# Patient Record
Sex: Female | Born: 1980 | Race: Black or African American | Hispanic: No | Marital: Single | Smoking: Never smoker
Health system: Southern US, Community
[De-identification: ages and names within clinical notes are randomized; demographics above are authoritative.]

## PROBLEM LIST (undated history)

## (undated) DIAGNOSIS — J45909 Unspecified asthma, uncomplicated: Secondary | ICD-10-CM

## (undated) DIAGNOSIS — U099 Post covid-19 condition, unspecified: Secondary | ICD-10-CM

## (undated) DIAGNOSIS — I10 Essential (primary) hypertension: Secondary | ICD-10-CM

## (undated) DIAGNOSIS — G4730 Sleep apnea, unspecified: Secondary | ICD-10-CM

## (undated) DIAGNOSIS — E669 Obesity, unspecified: Secondary | ICD-10-CM

## (undated) DIAGNOSIS — G43909 Migraine, unspecified, not intractable, without status migrainosus: Secondary | ICD-10-CM

## (undated) DIAGNOSIS — E119 Type 2 diabetes mellitus without complications: Secondary | ICD-10-CM

## (undated) HISTORY — DX: Obesity, unspecified: E66.9

## (undated) HISTORY — PX: BILATERAL CARPAL TUNNEL RELEASE: SHX6508

## (undated) HISTORY — PX: OTHER SURGICAL HISTORY: SHX169

## (undated) NOTE — *Deleted (*Deleted)
Physical Medicine and Rehabilitation Consult   Reason for Consult: Stroke with functional deficits.  Referring Physician: Dr. Ophelia Charter.    HPI: Shelly Shields is a 35 y.o. female with history of T2DM, asthma, OSA--CPAP, migraines, granulomatous iritis,  Covid 19 infection Jan '21 complicated by PE--no longer on [facility] who was admitted on 03/27/20 with acute onset of slurred speech and left sided weakness as well as reports increase in severity/frequency of HA.  UDS negative. 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   Patient out of window for tPA.  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   2D echo showed severe hypokinesis of basal to apical anteroseptum, inferoseptum and anterior LV walls with EF 20-25% and moderate LA dilatation. CT abdomen pelvis done due to reports of nausea/flank pain and revealed new 18 mm hypoattenuating lesion in right upper pole of kidney--nonspecific v/s related to pyelonephritis/evolving abscess v/s renal infarct as well as 2.2 cm defined lesion in inferior lingula suspicious for atypical/viral PNA. Dr. Jacinto Halim consulted for input on CM and recommended TEE for work up and eventual ischemia work up due to multiple risk factors. Speech therapy evaluation revealed dysarthria and work up underway. CIR recommended due to current deficits.     .   Review of Systems  Constitutional: Negative for chills and fever.  HENT: Negative for hearing loss.   Eyes: Negative for blurred vision and double vision.  Respiratory: Positive for cough (still has a lot of coughing) and shortness of breath (with activity). Negative for sputum production and stridor.   Cardiovascular: Negative for  chest pain and palpitations.  Gastrointestinal: Negative for heartburn, nausea and vomiting.  Genitourinary: Negative for dysuria, frequency and urgency.       Right flank pain over the weekend.--better now  Musculoskeletal: Negative for back pain and myalgias.       Chronic left foot pain--arch pain with standing.   Skin: Positive for rash.  Neurological: Positive for speech change and focal weakness. Negative for dizziness and headaches.  Psychiatric/Behavioral: Negative for memory loss.      Past Medical History:  Diagnosis Date  . Asthma   . COVID-19 long hauler   . Diabetes mellitus without complication (HCC)   . Hypertension   . Migraines   . Obesity   . Pulmonary embolism (HCC) 05/2019   with COVID  . Sleep apnea     Past Surgical History:  Procedure Laterality Date  . BILATERAL CARPAL TUNNEL RELEASE      Family History  Problem Relation Age of Onset  . Hypertension Mother   . Thyroid disease Mother   . Hypertension Father   . Stroke Father 48  . Diabetes Father   . Sleep apnea Father   . CVA Paternal Uncle 50    Social History:  Lives with cousins and other family lives on the same property. 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 She reports that she has never smoked. She has never used smokeless tobacco. She reports current alcohol use-- couple of beers on the weekends.  She reports that she does not use drugs.    Allergies  Allergen Reactions  . Lisinopril Cough  . Metformin  Diarrhea  . Ibuprofen Hives  . Ketorolac Hives    Medications Prior to Admission  Medication Sig Dispense Refill  . Baclofen 5 MG TABS TAKE 1 TABLET BY MOUTH THREE TIMES DAILY AS NEEDED FOR HEADACHE OR NECK PAIN    . Blood Glucose Monitoring Suppl (ACCU-CHEK NANO SMARTVIEW) W/DEVICE KIT 1 Device by Does not apply route 4 (four) times daily - after meals and at bedtime. 1 kit 0  . escitalopram (LEXAPRO) 10 MG tablet Take  10 mg by mouth daily.    . fexofenadine (ALLEGRA) 180 MG tablet Take 180 mg by mouth daily as needed.     . fluticasone (FLONASE) 50 MCG/ACT nasal spray Place 2 sprays into both nostrils daily as needed for allergies.   3  . fluticasone (FLOVENT HFA) 110 MCG/ACT inhaler Inhale 1 puff into the lungs daily.     Marland Kitchen glucose blood (ACCU-CHEK SMARTVIEW) test strip Check sugar 6 x daily (Patient taking differently: Check sugar 6 x daily. Uses true test instead) 200 each 3  . Insulin Glargine (LANTUS) 100 UNIT/ML Solostar Pen Inject 35 Units into the skin daily. (Patient taking differently: Inject 80 Units into the skin 2 (two) times daily. ) 15 mL 11  . insulin lispro protamine-lispro (HUMALOG 75/25 MIX) (75-25) 100 UNIT/ML SUSP injection Inject 50 Units into the skin 2 (two) times daily with a meal.     . Insulin Pen Needle (INSUPEN PEN NEEDLES) 32G X 4 MM MISC BD Pen Needles- brand specific. Inject insulin via insulin pen daily 200 each 3  . Lancet Devices (ACCU-CHEK SOFTCLIX) lancets Use as instructed for blood glucose checks four times daily, before meals and at bedtime 100 each 5  . medroxyPROGESTERone (DEPO-PROVERA) 150 MG/ML injection Inject 150 mg into the muscle every 3 (three) months.    . nortriptyline (PAMELOR) 10 MG capsule Take 10 mg by mouth at bedtime.    . ondansetron (ZOFRAN) 4 MG tablet Take 1 tablet (4 mg total) by mouth every 8 (eight) hours as needed for nausea or vomiting. 20 tablet 0  . pantoprazole (PROTONIX) 40 MG tablet Take 40 mg by mouth daily.    . rosuvastatin (CRESTOR) 10 MG tablet Take 10 mg by mouth 2 (two) times a week.    . SUMAtriptan (IMITREX) 100 MG tablet Take 1 tablet (100 mg total) by mouth every 2 (two) hours as needed for migraine or headache. 10 tablet 1  . topiramate (TOPAMAX) 100 MG tablet Take 200 mg by mouth at bedtime.     . TRUEPLUS LANCETS 33G MISC USE TO CHECK BLOOD GLUCOSE QID - AFTER MEALS AND AT BEDTIME  3  . valsartan (DIOVAN) 160 MG tablet Take 160  mg by mouth daily.    . VENTOLIN HFA 108 (90 BASE) MCG/ACT inhaler Use as directed 2 puffs in the mouth or throat 4 (four) times daily as needed for shortness of breath.   3  . apixaban (ELIQUIS) 5 MG TABS tablet Take 2 tablets (10 mg total) by mouth 2 (two) times daily for 5 days. 20 tablet 0    Home: Home Living Family/patient expects to be discharged to:: Private residence Living Arrangements: Other relatives  Functional History:   Functional Status:  Mobility:          ADL:    Cognition: Cognition Orientation Level: Oriented X4    Blood pressure (!) 129/93, pulse 100, temperature 98.3 F (36.8 C), temperature source Oral, resp. rate 18, SpO2 97 %. Physical Exam Vitals and  nursing note reviewed.  Constitutional:      Appearance: Normal appearance.     Comments: Obese female, walking with PT with reports of left foot pain--occasional left toe drag and mild left inattention.  NAD.   Pulmonary:     Effort: Pulmonary effort is normal.  Genitourinary:    Comments: Reddish appearing urine via Purewick--was taking pyridium PTA.  Skin:    Comments: Flat large macular lesions on bilateral shins.   Neurological:     Mental Status: She is alert and oriented to person, place, and time.     Gait: Gait abnormal.     Comments: Left facial weakness with moderate anarthria/dysarthria. Able to answer orientation questions and follow simple motor commands without difficulty.      Results for orders placed or performed during the hospital encounter of 03/27/20 (from the past 24 hour(s))  CBG monitoring, ED     Status: Abnormal   Collection Time: 03/27/20  9:23 AM  Result Value Ref Range   Glucose-Capillary 346 (H) 70 - 99 mg/dL  Ethanol     Status: None   Collection Time: 03/27/20  9:46 AM  Result Value Ref Range   Alcohol, Ethyl (B) <10 <10 mg/dL  Protime-INR     Status: None   Collection Time: 03/27/20  9:46 AM  Result Value Ref Range   Prothrombin Time 14.6 11.4 - 15.2  seconds   INR 1.2 0.8 - 1.2  APTT     Status: Abnormal   Collection Time: 03/27/20  9:46 AM  Result Value Ref Range   aPTT 22 (L) 24 - 36 seconds  CBC     Status: Abnormal   Collection Time: 03/27/20  9:46 AM  Result Value Ref Range   WBC 11.7 (H) 4.0 - 10.5 K/uL   RBC 4.54 3.87 - 5.11 MIL/uL   Hemoglobin 12.8 12.0 - 15.0 g/dL   HCT 16.1 36 - 46 %   MCV 86.6 80.0 - 100.0 fL   MCH 28.2 26.0 - 34.0 pg   MCHC 32.6 30.0 - 36.0 g/dL   RDW 09.6 04.5 - 40.9 %   Platelets 195 150 - 400 K/uL   nRBC 0.0 0.0 - 0.2 %  Differential     Status: Abnormal   Collection Time: 03/27/20  9:46 AM  Result Value Ref Range   Neutrophils Relative % 84 %   Neutro Abs 9.7 (H) 1.7 - 7.7 K/uL   Lymphocytes Relative 12 %   Lymphs Abs 1.4 0.7 - 4.0 K/uL   Monocytes Relative 4 %   Monocytes Absolute 0.5 0.1 - 1.0 K/uL   Eosinophils Relative 0 %   Eosinophils Absolute 0.0 0.0 - 0.5 K/uL   Basophils Relative 0 %   Basophils Absolute 0.0 0.0 - 0.1 K/uL   Immature Granulocytes 0 %   Abs Immature Granulocytes 0.04 0.00 - 0.07 K/uL  Comprehensive metabolic panel     Status: Abnormal   Collection Time: 03/27/20  9:46 AM  Result Value Ref Range   Sodium 133 (L) 135 - 145 mmol/L   Potassium 3.6 3.5 - 5.1 mmol/L   Chloride 104 98 - 111 mmol/L   CO2 21 (L) 22 - 32 mmol/L   Glucose, Bld 373 (H) 70 - 99 mg/dL   BUN 5 (L) 6 - 20 mg/dL   Creatinine, Ser 8.11 0.44 - 1.00 mg/dL   Calcium 8.4 (L) 8.9 - 10.3 mg/dL   Total Protein 6.2 (L) 6.5 - 8.1 g/dL   Albumin  3.1 (L) 3.5 - 5.0 g/dL   AST 13 (L) 15 - 41 U/L   ALT 18 0 - 44 U/L   Alkaline Phosphatase 82 38 - 126 U/L   Total Bilirubin 1.0 0.3 - 1.2 mg/dL   GFR, Estimated >78 >29 mL/min   Anion gap 8 5 - 15  Beta-hydroxybutyric acid     Status: Abnormal   Collection Time: 03/27/20  9:46 AM  Result Value Ref Range   Beta-Hydroxybutyric Acid 0.67 (H) 0.05 - 0.27 mmol/L  Hemoglobin A1c     Status: Abnormal   Collection Time: 03/27/20  9:46 AM  Result Value Ref  Range   Hgb A1c MFr Bld 11.2 (H) 4.8 - 5.6 %   Mean Plasma Glucose 274.74 mg/dL  I-stat chem 8, ED     Status: Abnormal   Collection Time: 03/27/20 10:02 AM  Result Value Ref Range   Sodium 137 135 - 145 mmol/L   Potassium 3.7 3.5 - 5.1 mmol/L   Chloride 103 98 - 111 mmol/L   BUN 7 6 - 20 mg/dL   Creatinine, Ser 5.62 0.44 - 1.00 mg/dL   Glucose, Bld 130 (H) 70 - 99 mg/dL   Calcium, Ion 8.65 7.84 - 1.40 mmol/L   TCO2 20 (L) 22 - 32 mmol/L   Hemoglobin 12.9 12.0 - 15.0 g/dL   HCT 69.6 36 - 46 %  I-Stat beta hCG blood, ED     Status: None   Collection Time: 03/27/20 10:02 AM  Result Value Ref Range   I-stat hCG, quantitative <5.0 <5 mIU/mL   Comment 3          Respiratory Panel by RT PCR (Flu A&B, Covid) - Nasopharyngeal Swab     Status: None   Collection Time: 03/27/20 10:04 AM   Specimen: Nasopharyngeal Swab  Result Value Ref Range   SARS Coronavirus 2 by RT PCR NEGATIVE NEGATIVE   Influenza A by PCR NEGATIVE NEGATIVE   Influenza B by PCR NEGATIVE NEGATIVE  Urine rapid drug screen (hosp performed)     Status: None   Collection Time: 03/27/20 10:24 AM  Result Value Ref Range   Opiates NONE DETECTED NONE DETECTED   Cocaine NONE DETECTED NONE DETECTED   Benzodiazepines NONE DETECTED NONE DETECTED   Amphetamines NONE DETECTED NONE DETECTED   Tetrahydrocannabinol NONE DETECTED NONE DETECTED   Barbiturates NONE DETECTED NONE DETECTED  Urinalysis, Routine w reflex microscopic Urine, Clean Catch     Status: Abnormal   Collection Time: 03/27/20 10:24 AM  Result Value Ref Range   Color, Urine ORANGE (A) YELLOW   APPearance CLEAR CLEAR   Specific Gravity, Urine  1.005 - 1.030    TEST NOT REPORTED DUE TO COLOR INTERFERENCE OF URINE PIGMENT   pH  5.0 - 8.0    TEST NOT REPORTED DUE TO COLOR INTERFERENCE OF URINE PIGMENT   Glucose, UA (A) NEGATIVE mg/dL    TEST NOT REPORTED DUE TO COLOR INTERFERENCE OF URINE PIGMENT   Hgb urine dipstick (A) NEGATIVE    TEST NOT REPORTED DUE TO COLOR  INTERFERENCE OF URINE PIGMENT   Bilirubin Urine (A) NEGATIVE    TEST NOT REPORTED DUE TO COLOR INTERFERENCE OF URINE PIGMENT   Ketones, ur (A) NEGATIVE mg/dL    TEST NOT REPORTED DUE TO COLOR INTERFERENCE OF URINE PIGMENT   Protein, ur (A) NEGATIVE mg/dL    TEST NOT REPORTED DUE TO COLOR INTERFERENCE OF URINE PIGMENT   Nitrite (A) NEGATIVE    TEST  NOT REPORTED DUE TO COLOR INTERFERENCE OF URINE PIGMENT   Leukocytes,Ua (A) NEGATIVE    TEST NOT REPORTED DUE TO COLOR INTERFERENCE OF URINE PIGMENT  Urinalysis, Microscopic (reflex)     Status: Abnormal   Collection Time: 03/27/20 10:24 AM  Result Value Ref Range   RBC / HPF 0-5 0 - 5 RBC/hpf   WBC, UA 0-5 0 - 5 WBC/hpf   Bacteria, UA RARE (A) NONE SEEN   Squamous Epithelial / LPF 6-10 0 - 5  CBG monitoring, ED     Status: Abnormal   Collection Time: 03/27/20  5:46 PM  Result Value Ref Range   Glucose-Capillary 303 (H) 70 - 99 mg/dL  Glucose, capillary     Status: Abnormal   Collection Time: 03/27/20  8:53 PM  Result Value Ref Range   Glucose-Capillary 254 (H) 70 - 99 mg/dL  Glucose, capillary     Status: Abnormal   Collection Time: 03/28/20 12:01 AM  Result Value Ref Range   Glucose-Capillary 218 (H) 70 - 99 mg/dL  Lipid panel     Status: Abnormal   Collection Time: 03/28/20  4:15 AM  Result Value Ref Range   Cholesterol 176 0 - 200 mg/dL   Triglycerides 161 <096 mg/dL   HDL 29 (L) >04 mg/dL   Total CHOL/HDL Ratio 6.1 RATIO   VLDL 21 0 - 40 mg/dL   LDL Cholesterol 540 (H) 0 - 99 mg/dL  Antithrombin III     Status: None   Collection Time: 03/28/20  4:15 AM  Result Value Ref Range   AntiThromb III Func 99 75 - 120 %  TSH     Status: None   Collection Time: 03/28/20  4:15 AM  Result Value Ref Range   TSH 3.896 0.350 - 4.500 uIU/mL  Glucose, capillary     Status: Abnormal   Collection Time: 03/28/20  4:30 AM  Result Value Ref Range   Glucose-Capillary 205 (H) 70 - 99 mg/dL  Glucose, capillary     Status: Abnormal    Collection Time: 03/28/20  8:26 AM  Result Value Ref Range   Glucose-Capillary 193 (H) 70 - 99 mg/dL   CT Code Stroke CTA Head W/WO contrast  Result Date: 03/27/2020 CLINICAL DATA:  27 year old female code stroke presentation. Slurred speech. Plain head CT suspicious for right MCA infarct ASPECTS 8. History of diabetes, hypertension. Family history of stroke. Status post COVID-19 and PE in February. EXAM: CT ANGIOGRAPHY HEAD AND NECK CT PERFUSION BRAIN TECHNIQUE: Multidetector CT imaging of the head and neck was performed using the standard protocol during bolus administration of intravenous contrast. Multiplanar CT image reconstructions and MIPs were obtained to evaluate the vascular anatomy. Carotid stenosis measurements (when applicable) are obtained utilizing NASCET criteria, using the distal internal carotid diameter as the denominator. Multiphase CT imaging of the brain was performed following IV bolus contrast injection. Subsequent parametric perfusion maps were calculated using RAPID software. CONTRAST:  OMNIPAQUE IOHEXOL 350 MG/ML SOLN COMPARISON:  plain head CT  0929 hours today. FINDINGS: CT Brain Perfusion Findings: ASPECTS: 8 CBF (<30%) Volume: 16mL Perfusion (Tmax>6.0s) volume: 22mL.  Hypoperfusion index 0.5. Mismatch Volume: 6mL Infarction Location:Right insula, operculum, largely corresponding to the plain CT findings. CTA NECK Skeleton: No acute osseous abnormality identified. Upper chest: Negative aside from mild motion artifact. Other neck: Negative. Aortic arch: 3 vessel arch configuration.  No arch atherosclerosis. Right carotid system: Negative aside from a partially retropharyngeal course. Left carotid system: Negative. Vertebral arteries: Detail of  the proximal right subclavian artery and right vertebral origin partially obscured by dense right subclavian venous contrast. The right V1 segment appears normal. The right vertebral is patent to the skull base with no plaque or  stenosis identified. Normal proximal left subclavian artery and left vertebral artery origin. Left vertebral artery is fairly codominant and within normal limits to the skull base. CTA HEAD Posterior circulation: Codominant distal vertebral arteries are within normal limits. Normal PICA origins and vertebrobasilar junction. Patent basilar artery without stenosis. AICA, SCA and PCA origins are within normal limits. Posterior communicating arteries are diminutive or absent. There is mild irregularity of the right PCA P2 segment (series 2, image 23. No significant stenosis. Otherwise bilateral PCA branches are within normal limits. Anterior circulation: Both ICA siphons are patent with no plaque or stenosis identified. Patent carotid termini. Normal MCA and ACA origins. Mildly dominant right A1. mild irregularity of the left A1 (series 10, image 22). Anterior communicating artery within normal limits. Other bilateral PCA branches are tortuous but within normal limits. Left MCA M1 segment and bifurcation are patent without stenosis. But thick MIP images on series 12 suggest mild irregularity of left MCA M2 and M3 branches. Right MCA M1 segment and right MCA trifurcation are patent without stenosis. No right MCA M2 branch occlusion or irregularity is identified. But a right M3 branch occlusion in the middle division is identified on series 12, image 14 and series 7 images 88 and 87. Other right MCA branches appear within normal limits. Venous sinuses: Early contrast timing, not evaluated. Anatomic variants: Mildly dominant right A1. Review of the MIP images confirms the above findings IMPRESSION: 1. Negative for large vessel occlusion but positive for Right MCA M3 occlusion. CT Perfusion detects core infarct with minimally larger penumbra at the right insula/operculum corresponding to the plain CT finding. Preliminary report of the above These results were communicated to Dr. Iver Nestle at 1008 hours on 03/27/2020 by text  page via the [facility] messaging system. 2. Additionally, mild vessel irregularity is noted in multiple other circle-of-Willis branches, including the left A1, right P2, left M2. Although nonspecific this constellation of clinical and imaging findings might indicate accelerated branch vessel atherosclerosis. Although other large vessels appear normal, with no atherosclerosis in the neck or at the aortic arch. Electronically Signed   By: Odessa Fleming M.D.   On: 03/27/2020 10:24   CT Code Stroke CTA Neck W/WO contrast  Result Date: 03/27/2020 CLINICAL DATA:  30 year old female code stroke presentation. Slurred speech. Plain head CT suspicious for right MCA infarct ASPECTS 8. History of diabetes, hypertension. Family history of stroke. Status post COVID-19 and PE in February. EXAM: CT ANGIOGRAPHY HEAD AND NECK CT PERFUSION BRAIN TECHNIQUE: Multidetector CT imaging of the head and neck was performed using the standard protocol during bolus administration of intravenous contrast. Multiplanar CT image reconstructions and MIPs were obtained to evaluate the vascular anatomy. Carotid stenosis measurements (when applicable) are obtained utilizing NASCET criteria, using the distal internal carotid diameter as the denominator. Multiphase CT imaging of the brain was performed following IV bolus contrast injection. Subsequent parametric perfusion maps were calculated using RAPID software. CONTRAST:  OMNIPAQUE IOHEXOL 350 MG/ML SOLN COMPARISON:  plain head CT  0929 hours today. FINDINGS: CT Brain Perfusion Findings: ASPECTS: 8 CBF (<30%) Volume: 16mL Perfusion (Tmax>6.0s) volume: 22mL.  Hypoperfusion index 0.5. Mismatch Volume: 6mL Infarction Location:Right insula, operculum, largely corresponding to the plain CT findings. CTA NECK Skeleton: No acute osseous abnormality identified. Upper chest: Negative aside  from mild motion artifact. Other neck: Negative. Aortic arch: 3 vessel arch configuration.  No arch atherosclerosis. Right  carotid system: Negative aside from a partially retropharyngeal course. Left carotid system: Negative. Vertebral arteries: Detail of the proximal right subclavian artery and right vertebral origin partially obscured by dense right subclavian venous contrast. The right V1 segment appears normal. The right vertebral is patent to the skull base with no plaque or stenosis identified. Normal proximal left subclavian artery and left vertebral artery origin. Left vertebral artery is fairly codominant and within normal limits to the skull base. CTA HEAD Posterior circulation: Codominant distal vertebral arteries are within normal limits. Normal PICA origins and vertebrobasilar junction. Patent basilar artery without stenosis. AICA, SCA and PCA origins are within normal limits. Posterior communicating arteries are diminutive or absent. There is mild irregularity of the right PCA P2 segment (series 2, image 23. No significant stenosis. Otherwise bilateral PCA branches are within normal limits. Anterior circulation: Both ICA siphons are patent with no plaque or stenosis identified. Patent carotid termini. Normal MCA and ACA origins. Mildly dominant right A1. mild irregularity of the left A1 (series 10, image 22). Anterior communicating artery within normal limits. Other bilateral PCA branches are tortuous but within normal limits. Left MCA M1 segment and bifurcation are patent without stenosis. But thick MIP images on series 12 suggest mild irregularity of left MCA M2 and M3 branches. Right MCA M1 segment and right MCA trifurcation are patent without stenosis. No right MCA M2 branch occlusion or irregularity is identified. But a right M3 branch occlusion in the middle division is identified on series 12, image 14 and series 7 images 88 and 87. Other right MCA branches appear within normal limits. Venous sinuses: Early contrast timing, not evaluated. Anatomic variants: Mildly dominant right A1. Review of the MIP images confirms  the above findings IMPRESSION: 1. Negative for large vessel occlusion but positive for Right MCA M3 occlusion. CT Perfusion detects core infarct with minimally larger penumbra at the right insula/operculum corresponding to the plain CT finding. Preliminary report of the above These results were communicated to Dr. Iver Nestle at 1008 hours on 03/27/2020 by text page via the [facility] messaging system. 2. Additionally, mild vessel irregularity is noted in multiple other circle-of-Willis branches, including the left A1, right P2, left M2. Although nonspecific this constellation of clinical and imaging findings might indicate accelerated branch vessel atherosclerosis. Although other large vessels appear normal, with no atherosclerosis in the neck or at the aortic arch. Electronically Signed   By: Odessa Fleming M.D.   On: 03/27/2020 10:24   MR ANGIO HEAD WO CONTRAST  Result Date: 03/27/2020 CLINICAL DATA:  Stroke follow-up. Slurred speech. Left-sided weakness. EXAM: MRI HEAD WITHOUT CONTRAST MRA HEAD WITHOUT CONTRAST TECHNIQUE: Multiplanar, multiecho pulse sequences of the brain and surrounding structures were obtained without intravenous contrast. Angiographic images of the head were obtained using MRA technique without contrast. COMPARISON:  Head CT, CTA, and CTP 03/27/2020 FINDINGS: MRI HEAD FINDINGS Brain: There is a moderate-sized acute right MCA territory infarct involving the posterior frontal lobe and insula with good correlation with the earlier CTP. there is a small amount of associated petechial hemorrhage without malignant hemorrhagic transformation. Additionally, there are subcentimeter infarcts involving cortex and white matter in the left parietal and left occipital lobes which are largely subacute in appearance and which also have a small amount of associated petechial hemorrhage. There is no intracranial mass effect or extra-axial fluid collection. The ventricles are normal in size. Vascular: Major  intracranial  vascular flow voids are preserved. Skull and upper cervical spine: Unremarkable bone marrow signal. Sinuses/Orbits: Unremarkable orbits. Paranasal sinuses and mastoid air cells are clear. Other: None. MRA HEAD FINDINGS The visualized distal vertebral arteries are widely patent to the basilar. Patent PICA, AICA, and SCA origins are seen bilaterally. The basilar artery is widely patent. Posterior communicating arteries are not clearly identified and may be diminutive or absent. Both PCAs are patent without evidence of a significant proximal stenosis. The internal carotid arteries are widely patent from skull base to carotid termini. ACAs and MCAs are patent without evidence of a significant proximal stenosis. As seen on the earlier CTA, there is a right M3 branch occlusion with some distal reconstitution. The scattered areas of mild branch vessel irregularity involving anterior and posterior circulation elsewhere on CTA are not apparent on this MRA. No aneurysm is identified. IMPRESSION: 1. Moderate-sized acute right MCA infarct. 2. Small subacute left parieto-occipital infarcts. 3. Right M3 branch occlusion, otherwise negative head MRA. Electronically Signed   By: Sebastian Ache M.D.   On: 03/27/2020 17:45   MR BRAIN WO CONTRAST  Result Date: 03/27/2020 CLINICAL DATA:  Stroke follow-up. Slurred speech. Left-sided weakness. EXAM: MRI HEAD WITHOUT CONTRAST MRA HEAD WITHOUT CONTRAST TECHNIQUE: Multiplanar, multiecho pulse sequences of the brain and surrounding structures were obtained without intravenous contrast. Angiographic images of the head were obtained using MRA technique without contrast. COMPARISON:  Head CT, CTA, and CTP 03/27/2020 FINDINGS: MRI HEAD FINDINGS Brain: There is a moderate-sized acute right MCA territory infarct involving the posterior frontal lobe and insula with good correlation with the earlier CTP. there is a small amount of associated petechial hemorrhage without malignant hemorrhagic  transformation. Additionally, there are subcentimeter infarcts involving cortex and white matter in the left parietal and left occipital lobes which are largely subacute in appearance and which also have a small amount of associated petechial hemorrhage. There is no intracranial mass effect or extra-axial fluid collection. The ventricles are normal in size. Vascular: Major intracranial vascular flow voids are preserved. Skull and upper cervical spine: Unremarkable bone marrow signal. Sinuses/Orbits: Unremarkable orbits. Paranasal sinuses and mastoid air cells are clear. Other: None. MRA HEAD FINDINGS The visualized distal vertebral arteries are widely patent to the basilar. Patent PICA, AICA, and SCA origins are seen bilaterally. The basilar artery is widely patent. Posterior communicating arteries are not clearly identified and may be diminutive or absent. Both PCAs are patent without evidence of a significant proximal stenosis. The internal carotid arteries are widely patent from skull base to carotid termini. ACAs and MCAs are patent without evidence of a significant proximal stenosis. As seen on the earlier CTA, there is a right M3 branch occlusion with some distal reconstitution. The scattered areas of mild branch vessel irregularity involving anterior and posterior circulation elsewhere on CTA are not apparent on this MRA. No aneurysm is identified. IMPRESSION: 1. Moderate-sized acute right MCA infarct. 2. Small subacute left parieto-occipital infarcts. 3. Right M3 branch occlusion, otherwise negative head MRA. Electronically Signed   By: Sebastian Ache M.D.   On: 03/27/2020 17:45   CT ABDOMEN PELVIS W CONTRAST  Result Date: 03/27/2020 CLINICAL DATA:  Flank pain. EXAM: CT ABDOMEN AND PELVIS WITH CONTRAST TECHNIQUE: Multidetector CT imaging of the abdomen and pelvis was performed using the standard protocol following bolus administration of intravenous contrast. CONTRAST:  60mL OMNIPAQUE IOHEXOL 300 MG/ML   SOLN COMPARISON:  CT pelvis 06/30/2019.  CT abdomen/pelvis 06/22/2019. FINDINGS: Lower chest: Relatively well-defined 2.2 cm  lesion in the inferior lingula (image 4/series 5) measures water density. This may be loculated pleural fluid or intraparenchymal fluid collection in this patient with a history of pulmonary embolus and bilateral ground-glass airspace disease on the previous study suspicious for multifocal atypical/viral pneumonia. Heart size upper normal. Hepatobiliary: No suspicious focal abnormality within the liver parenchyma. There is no evidence for gallstones, gallbladder wall thickening, or pericholecystic fluid. No intrahepatic or extrahepatic biliary dilation. Pancreas: No focal mass lesion. No dilatation of the main duct. No intraparenchymal cyst. No peripancreatic edema. Spleen: No splenomegaly. No focal mass lesion. Adrenals/Urinary Tract: No adrenal nodule or mass. New 18 mm hypoattenuating lesion identified upper pole right kidney with areas of subcapsular multifocal decreased enhancement in the lower pole right kidney. Left kidney unremarkable. No evidence for hydroureter. The urinary bladder appears normal for the degree of distention. Stomach/Bowel: Stomach is unremarkable. No gastric wall thickening. No evidence of outlet obstruction. Duodenum is normally positioned as is the ligament of Treitz. No small bowel wall thickening. No small bowel dilatation. The terminal ileum is normal. The appendix is normal. No gross colonic mass. No colonic wall thickening. Vascular/Lymphatic: No abdominal aortic aneurysm. No abdominal aortic atherosclerotic calcification. There is no gastrohepatic or hepatoduodenal ligament lymphadenopathy. No retroperitoneal or mesenteric lymphadenopathy. No pelvic sidewall lymphadenopathy. Reproductive: The uterus is unremarkable.  There is no adnexal mass. Other: No intraperitoneal free fluid. Musculoskeletal: No worrisome lytic or sclerotic osseous abnormality.  IMPRESSION: 1. New 18 mm hypoattenuating lesion upper pole right kidney with areas of subcapsular multifocal decreased enhancement in the lower pole right kidney. Imaging features are nonspecific and could be related to pyelonephritis and phlegmon/evolving abscess in the upper pole right kidney cannot be excluded. Alternatively, multifocal right renal infarct could have this appearance. Correlation with urinalysis may prove helpful. 2. Relatively well-defined 2.2 cm lesion in the inferior lingula measures water density. This may be loculated pleural fluid or intraparenchymal fluid collection in this patient with a history of pulmonary embolus and bilateral ground-glass airspace disease on the previous study suspicious for multifocal atypical/viral pneumonia. Consider follow-up to ensure resolution. Electronically Signed   By: Kennith Center M.D.   On: 03/27/2020 12:03   CT Code Stroke Cerebral Perfusion with contrast  Result Date: 03/27/2020 CLINICAL DATA:  78 year old female code stroke presentation. Slurred speech. Plain head CT suspicious for right MCA infarct ASPECTS 8. History of diabetes, hypertension. Family history of stroke. Status post COVID-19 and PE in February. EXAM: CT ANGIOGRAPHY HEAD AND NECK CT PERFUSION BRAIN TECHNIQUE: Multidetector CT imaging of the head and neck was performed using the standard protocol during bolus administration of intravenous contrast. Multiplanar CT image reconstructions and MIPs were obtained to evaluate the vascular anatomy. Carotid stenosis measurements (when applicable) are obtained utilizing NASCET criteria, using the distal internal carotid diameter as the denominator. Multiphase CT imaging of the brain was performed following IV bolus contrast injection. Subsequent parametric perfusion maps were calculated using RAPID software. CONTRAST:  OMNIPAQUE IOHEXOL 350 MG/ML SOLN COMPARISON:  plain head CT  0929 hours today. FINDINGS: CT Brain Perfusion Findings:  ASPECTS: 8 CBF (<30%) Volume: 16mL Perfusion (Tmax>6.0s) volume: 22mL.  Hypoperfusion index 0.5. Mismatch Volume: 6mL Infarction Location:Right insula, operculum, largely corresponding to the plain CT findings. CTA NECK Skeleton: No acute osseous abnormality identified. Upper chest: Negative aside from mild motion artifact. Other neck: Negative. Aortic arch: 3 vessel arch configuration.  No arch atherosclerosis. Right carotid system: Negative aside from a partially retropharyngeal course. Left carotid system: Negative. Vertebral  arteries: Detail of the proximal right subclavian artery and right vertebral origin partially obscured by dense right subclavian venous contrast. The right V1 segment appears normal. The right vertebral is patent to the skull base with no plaque or stenosis identified. Normal proximal left subclavian artery and left vertebral artery origin. Left vertebral artery is fairly codominant and within normal limits to the skull base. CTA HEAD Posterior circulation: Codominant distal vertebral arteries are within normal limits. Normal PICA origins and vertebrobasilar junction. Patent basilar artery without stenosis. AICA, SCA and PCA origins are within normal limits. Posterior communicating arteries are diminutive or absent. There is mild irregularity of the right PCA P2 segment (series 2, image 23. No significant stenosis. Otherwise bilateral PCA branches are within normal limits. Anterior circulation: Both ICA siphons are patent with no plaque or stenosis identified. Patent carotid termini. Normal MCA and ACA origins. Mildly dominant right A1. mild irregularity of the left A1 (series 10, image 22). Anterior communicating artery within normal limits. Other bilateral PCA branches are tortuous but within normal limits. Left MCA M1 segment and bifurcation are patent without stenosis. But thick MIP images on series 12 suggest mild irregularity of left MCA M2 and M3 branches. Right MCA M1 segment and  right MCA trifurcation are patent without stenosis. No right MCA M2 branch occlusion or irregularity is identified. But a right M3 branch occlusion in the middle division is identified on series 12, image 14 and series 7 images 88 and 87. Other right MCA branches appear within normal limits. Venous sinuses: Early contrast timing, not evaluated. Anatomic variants: Mildly dominant right A1. Review of the MIP images confirms the above findings IMPRESSION: 1. Negative for large vessel occlusion but positive for Right MCA M3 occlusion. CT Perfusion detects core infarct with minimally larger penumbra at the right insula/operculum corresponding to the plain CT finding. Preliminary report of the above These results were communicated to Dr. Iver Nestle at 1008 hours on 03/27/2020 by text page via the [facility] messaging system. 2. Additionally, mild vessel irregularity is noted in multiple other circle-of-Willis branches, including the left A1, right P2, left M2. Although nonspecific this constellation of clinical and imaging findings might indicate accelerated branch vessel atherosclerosis. Although other large vessels appear normal, with no atherosclerosis in the neck or at the aortic arch. Electronically Signed   By: Odessa Fleming M.D.   On: 03/27/2020 10:24   ECHOCARDIOGRAM COMPLETE BUBBLE STUDY  Result Date: 03/27/2020    ECHOCARDIOGRAM REPORT   Patient Name:   Shelly Shields Date of Exam: 03/27/2020 Medical Rec #:  [id number]        Height:       61.0 in Accession #:    [id number]       Weight:       204.0 lb Date of Birth:  1980-06-09        BSA:          1.905 m Patient Age:    39 years         BP:           135/87 mmHg Patient Gender: F                HR:           100 bpm. Exam Location:  Inpatient Procedure: 2D Echo, Cardiac Doppler, Color Doppler and Intracardiac            Opacification Agent Indications:    Stroke 434.91 / I63.9  History:  Patient has prior history of Echocardiogram examinations, most                  recent 06/30/2019. Risk Factors:Hypertension, Diabetes and Sleep                 Apnea. COVID-19.  Sonographer:    Elmarie Shiley Dance Referring Phys: [id number] SRISHTI L BHAGAT IMPRESSIONS  1. Left ventricular ejection fraction, by estimation, is 20 to 25%. The left ventricle has severely decreased function. There is severe hypokinesis of the basal-to-apical anteroseptum, inferoseptum and anterior LV walls . Left ventricular diastolic parameters are consistent with Grade II diastolic dysfunction (pseudonormalization).  2. Right ventricular systolic function is normal. The right ventricular size is normal.  3. Left atrial size was moderately dilated.  4. The mitral valve is normal in structure. Trivial mitral valve regurgitation.  5. The aortic valve is tricuspid. There is mild thickening of the aortic valve. Aortic valve regurgitation is not visualized.  6. The inferior vena cava is normal in size with greater than 50% respiratory variability, suggesting right atrial pressure of 3 mmHg.  7. Agitated saline contrast bubble study was negative, with no evidence of any interatrial shunt. Comparison(s): Compared to prior echo in 06/2019, the LVEF is now severely reduced to 20-25% with severe hypokinesis of the inferoseptal, anteroseptal and anterior LV walls. Conclusion(s)/Recommendation(s): No intracardiac source of embolism detected on this transthoracic study. A transesophageal echocardiogram is recommended to exclude cardiac source of embolism if clinically indicated. FINDINGS  Left Ventricle: Left ventricular ejection fraction, by estimation, is 20 to 25%. The left ventricle has severely decreased function. There is severe hypokinesis of the basal-to-apical anteroseptum, inferoseptum and anterior LV walls. Definity contrast agent was given IV to delineate the left ventricular endocardial borders. The left ventricular internal cavity size was normal in size. There is no left ventricular hypertrophy. Left ventricular diastolic  parameters are consistent with Grade II diastolic  dysfunction (pseudonormalization). Right Ventricle: The right ventricular size is normal. No increase in right ventricular wall thickness. Right ventricular systolic function is normal. Left Atrium: Left atrial size was moderately dilated. Right Atrium: Right atrial size was normal in size. Pericardium: There is no evidence of pericardial effusion. Mitral Valve: The mitral valve is normal in structure. There is mild thickening of the mitral valve leaflet(s). Mild mitral annular calcification. Trivial mitral valve regurgitation. Tricuspid Valve: The tricuspid valve is normal in structure. Tricuspid valve regurgitation is trivial. Aortic Valve: The aortic valve is tricuspid. There is mild thickening of the aortic valve. Aortic valve regurgitation is not visualized. Pulmonic Valve: The pulmonic valve was normal in structure. Pulmonic valve regurgitation is trivial. Aorta: The aortic root and ascending aorta are structurally normal, with no evidence of dilitation. Venous: The inferior vena cava is normal in size with greater than 50% respiratory variability, suggesting right atrial pressure of 3 mmHg. IAS/Shunts: No atrial level shunt detected by color flow Doppler. Agitated saline contrast was given intravenously to evaluate for intracardiac shunting. Agitated saline contrast bubble study was negative, with no evidence of any interatrial shunt.  LEFT VENTRICLE PLAX 2D LVIDd:         5.00 cm LVIDs:         4.50 cm LV PW:         1.30 cm LV IVS:        0.80 cm LVOT diam:     1.80 cm LV SV:         31 LV SV Index:   16  LVOT Area:     2.54 cm  RIGHT VENTRICLE             IVC RV Basal diam:  2.40 cm     IVC diam: 1.70 cm RV S prime:     13.20 cm/s TAPSE (M-mode): 1.7 cm LEFT ATRIUM             Index       RIGHT ATRIUM           Index LA diam:        4.50 cm 2.36 cm/m  RA Area:     14.10 cm LA Vol (A2C):   75.3 ml 39.53 ml/m RA Volume:   33.40 ml  17.54 ml/m LA Vol  (A4C):   87.2 ml 45.78 ml/m LA Biplane Vol: 81.2 ml 42.63 ml/m  AORTIC VALVE LVOT Vmax:   74.20 cm/s LVOT Vmean:  49.200 cm/s LVOT VTI:    0.122 m  AORTA Ao Root diam: 2.90 cm Ao Asc diam:  2.80 cm MITRAL VALVE MV Area (PHT): 3.31 cm     SHUNTS MV Decel Time: 229 msec     Systemic VTI:  0.12 m MV E velocity: 120.00 cm/s  Systemic Diam: 1.80 cm MV A velocity: 85.90 cm/s MV E/A ratio:  1.40 Laurance Flatten MD Electronically signed by Laurance Flatten MD Signature Date/Time: 03/27/2020/4:25:55 PM    Final    CT HEAD CODE STROKE WO CONTRAST  Result Date: 03/27/2020 CLINICAL DATA:  Code stroke.  Slurred speech. EXAM: CT HEAD WITHOUT CONTRAST TECHNIQUE: Contiguous axial images were obtained from the base of the skull through the vertex without intravenous contrast. COMPARISON:  None. FINDINGS: Brain: There is loss of gray-white differentiation and subtle hypoattenuation involving the right insula and the right posterior frontal cortex and underlying white matter. Mild sulcal effacement without substantial mass effect. No midline shift. Basal cisterns are patent. No acute hemorrhage. No hydrocephalus. No mass lesion. Vascular: No definite hyperdense vessel identified. Skull: No acute fracture. Sinuses/Orbits: No acute finding. Other: No mastoid effusions. ASPECTS [facility] Stroke Program Early CT Score) - Ganglionic level infarction (caudate, lentiform nuclei, internal capsule, insula, M1-M3 cortex): 6 - Supraganglionic infarction (M4-M6 cortex): 2 Total score (0-10 with 10 being normal): 8 IMPRESSION: 1. Findings concerning for acute or early subacute infarct involving the right insula and posterior frontal lobe (MCA territory). ASPECTS is 8. 2. No substantial mass effect or acute hemorrhage. Code stroke imaging results were communicated on 03/27/2020 at 9:38 am to provider Dr. Iver Nestle Via telephone, who verbally acknowledged these results. Electronically Signed   By: Feliberto Harts MD   On: 03/27/2020 09:44     ***  Jacquelynn Cree, PA-C 03/28/2020

---

## 2002-02-15 ENCOUNTER — Emergency Department (HOSPITAL_COMMUNITY): Admission: EM | Admit: 2002-02-15 | Discharge: 2002-02-15 | Payer: Self-pay | Admitting: Emergency Medicine

## 2007-05-29 ENCOUNTER — Emergency Department (HOSPITAL_COMMUNITY): Admission: EM | Admit: 2007-05-29 | Discharge: 2007-05-29 | Payer: Self-pay | Admitting: Emergency Medicine

## 2013-02-26 ENCOUNTER — Other Ambulatory Visit: Payer: Self-pay | Admitting: Orthopedic Surgery

## 2013-02-26 DIAGNOSIS — R609 Edema, unspecified: Secondary | ICD-10-CM

## 2013-02-26 DIAGNOSIS — M25562 Pain in left knee: Secondary | ICD-10-CM

## 2013-03-04 ENCOUNTER — Ambulatory Visit
Admission: RE | Admit: 2013-03-04 | Discharge: 2013-03-04 | Disposition: A | Discharge status: Home / Self Care | Payer: BC Managed Care – PPO | Source: Ambulatory Visit | Attending: Orthopedic Surgery | Admitting: Orthopedic Surgery

## 2013-03-04 DIAGNOSIS — M25562 Pain in left knee: Secondary | ICD-10-CM

## 2013-03-04 DIAGNOSIS — R609 Edema, unspecified: Secondary | ICD-10-CM

## 2014-11-27 ENCOUNTER — Inpatient Hospital Stay (HOSPITAL_COMMUNITY)
Admission: EM | Admit: 2014-11-27 | Discharge: 2014-11-29 | DRG: 638 | Disposition: A | Discharge status: Home / Self Care | Payer: BLUE CROSS/BLUE SHIELD | Attending: Family Medicine | Admitting: Family Medicine

## 2014-11-27 ENCOUNTER — Encounter (HOSPITAL_COMMUNITY): Payer: Self-pay | Admitting: Nurse Practitioner

## 2014-11-27 DIAGNOSIS — N179 Acute kidney failure, unspecified: Secondary | ICD-10-CM | POA: Diagnosis present

## 2014-11-27 DIAGNOSIS — B370 Candidal stomatitis: Secondary | ICD-10-CM | POA: Diagnosis present

## 2014-11-27 DIAGNOSIS — Z833 Family history of diabetes mellitus: Secondary | ICD-10-CM

## 2014-11-27 DIAGNOSIS — E785 Hyperlipidemia, unspecified: Secondary | ICD-10-CM | POA: Diagnosis present

## 2014-11-27 DIAGNOSIS — I10 Essential (primary) hypertension: Secondary | ICD-10-CM | POA: Diagnosis present

## 2014-11-27 DIAGNOSIS — L83 Acanthosis nigricans: Secondary | ICD-10-CM | POA: Diagnosis present

## 2014-11-27 DIAGNOSIS — E1110 Type 2 diabetes mellitus with ketoacidosis without coma: Secondary | ICD-10-CM | POA: Diagnosis present

## 2014-11-27 DIAGNOSIS — G43909 Migraine, unspecified, not intractable, without status migrainosus: Secondary | ICD-10-CM | POA: Diagnosis present

## 2014-11-27 DIAGNOSIS — J45909 Unspecified asthma, uncomplicated: Secondary | ICD-10-CM | POA: Insufficient documentation

## 2014-11-27 DIAGNOSIS — N939 Abnormal uterine and vaginal bleeding, unspecified: Secondary | ICD-10-CM | POA: Diagnosis present

## 2014-11-27 DIAGNOSIS — J4520 Mild intermittent asthma, uncomplicated: Secondary | ICD-10-CM | POA: Diagnosis not present

## 2014-11-27 DIAGNOSIS — E1101 Type 2 diabetes mellitus with hyperosmolarity with coma: Secondary | ICD-10-CM | POA: Diagnosis not present

## 2014-11-27 DIAGNOSIS — B373 Candidiasis of vulva and vagina: Secondary | ICD-10-CM | POA: Diagnosis present

## 2014-11-27 DIAGNOSIS — E119 Type 2 diabetes mellitus without complications: Secondary | ICD-10-CM | POA: Diagnosis not present

## 2014-11-27 DIAGNOSIS — E1310 Other specified diabetes mellitus with ketoacidosis without coma: Secondary | ICD-10-CM | POA: Diagnosis present

## 2014-11-27 DIAGNOSIS — Z6841 Body Mass Index (BMI) 40.0 and over, adult: Secondary | ICD-10-CM

## 2014-11-27 DIAGNOSIS — E870 Hyperosmolality and hypernatremia: Secondary | ICD-10-CM | POA: Diagnosis present

## 2014-11-27 DIAGNOSIS — Z823 Family history of stroke: Secondary | ICD-10-CM | POA: Diagnosis not present

## 2014-11-27 DIAGNOSIS — E669 Obesity, unspecified: Secondary | ICD-10-CM | POA: Diagnosis present

## 2014-11-27 DIAGNOSIS — E869 Volume depletion, unspecified: Secondary | ICD-10-CM | POA: Diagnosis present

## 2014-11-27 DIAGNOSIS — Z8249 Family history of ischemic heart disease and other diseases of the circulatory system: Secondary | ICD-10-CM | POA: Diagnosis not present

## 2014-11-27 DIAGNOSIS — R631 Polydipsia: Secondary | ICD-10-CM | POA: Diagnosis present

## 2014-11-27 HISTORY — DX: Type 2 diabetes mellitus without complications: E11.9

## 2014-11-27 HISTORY — DX: Migraine, unspecified, not intractable, without status migrainosus: G43.909

## 2014-11-27 HISTORY — DX: Essential (primary) hypertension: I10

## 2014-11-27 HISTORY — DX: Unspecified asthma, uncomplicated: J45.909

## 2014-11-27 LAB — COMPREHENSIVE METABOLIC PANEL
ALT: 55 U/L — ABNORMAL HIGH (ref 14–54)
ANION GAP: 18 — AB (ref 5–15)
AST: 56 U/L — AB (ref 15–41)
Albumin: 4.8 g/dL (ref 3.5–5.0)
Alkaline Phosphatase: 248 U/L — ABNORMAL HIGH (ref 38–126)
BUN: 20 mg/dL (ref 6–20)
CALCIUM: 11 mg/dL — AB (ref 8.9–10.3)
CO2: 20 mmol/L — AB (ref 22–32)
Chloride: 99 mmol/L — ABNORMAL LOW (ref 101–111)
Creatinine, Ser: 1.79 mg/dL — ABNORMAL HIGH (ref 0.44–1.00)
GFR calc non Af Amer: 36 mL/min — ABNORMAL LOW (ref >60)
GFR, EST AFRICAN AMERICAN: 42 mL/min — AB (ref >60)
Glucose, Bld: 1200 mg/dL (ref 65–99)
POTASSIUM: 5.6 mmol/L — AB (ref 3.5–5.1)
SODIUM: 137 mmol/L (ref 135–145)
TOTAL PROTEIN: 8.4 g/dL — AB (ref 6.5–8.1)
Total Bilirubin: 1 mg/dL (ref 0.3–1.2)

## 2014-11-27 LAB — CBC
HCT: 51 % — ABNORMAL HIGH (ref 36.0–46.0)
Hemoglobin: 16.5 g/dL — ABNORMAL HIGH (ref 12.0–15.0)
MCH: 29 pg (ref 26.0–34.0)
MCHC: 32.4 g/dL (ref 30.0–36.0)
MCV: 89.8 fL (ref 78.0–100.0)
Platelets: 315 K/uL (ref 150–400)
RBC: 5.68 MIL/uL — ABNORMAL HIGH (ref 3.87–5.11)
RDW: 14.8 % (ref 11.5–15.5)
WBC: 11.5 K/uL — ABNORMAL HIGH (ref 4.0–10.5)

## 2014-11-27 LAB — POC URINE PREG, ED: PREG TEST UR: NEGATIVE

## 2014-11-27 LAB — GLUCOSE, CAPILLARY
GLUCOSE-CAPILLARY: 460 mg/dL — AB (ref 65–99)
GLUCOSE-CAPILLARY: 334 mg/dL — AB (ref 65–99)
Glucose-Capillary: >600 mg/dL (ref 65–99)
Glucose-Capillary: 564 mg/dL (ref 65–99)
Glucose-Capillary: 363 mg/dL — ABNORMAL HIGH (ref 65–99)

## 2014-11-27 LAB — BASIC METABOLIC PANEL
ANION GAP: 16 — AB (ref 5–15)
ANION GAP: 11 (ref 5–15)
Anion gap: 15 (ref 5–15)
Anion gap: 11 (ref 5–15)
BUN: 17 mg/dL (ref 6–20)
BUN: 19 mg/dL (ref 6–20)
BUN: 17 mg/dL (ref 6–20)
BUN: 16 mg/dL (ref 6–20)
CALCIUM: 10 mg/dL (ref 8.9–10.3)
CO2: 21 mmol/L — ABNORMAL LOW (ref 22–32)
CO2: 18 mmol/L — AB (ref 22–32)
CO2: 25 mmol/L (ref 22–32)
CO2: 25 mmol/L (ref 22–32)
CREATININE: 1.54 mg/dL — AB (ref 0.44–1.00)
Calcium: 10.3 mg/dL (ref 8.9–10.3)
Calcium: 10.6 mg/dL — ABNORMAL HIGH (ref 8.9–10.3)
Calcium: 10.3 mg/dL (ref 8.9–10.3)
Chloride: 110 mmol/L (ref 101–111)
Chloride: 115 mmol/L — ABNORMAL HIGH (ref 101–111)
Chloride: 114 mmol/L — ABNORMAL HIGH (ref 101–111)
Chloride: 116 mmol/L — ABNORMAL HIGH (ref 101–111)
Creatinine, Ser: 1.56 mg/dL — ABNORMAL HIGH (ref 0.44–1.00)
Creatinine, Ser: 1.37 mg/dL — ABNORMAL HIGH (ref 0.44–1.00)
Creatinine, Ser: 1.5 mg/dL — ABNORMAL HIGH (ref 0.44–1.00)
GFR calc Af Amer: 49 mL/min — ABNORMAL LOW (ref >60)
GFR calc Af Amer: 58 mL/min — ABNORMAL LOW (ref >60)
GFR calc Af Amer: 50 mL/min — ABNORMAL LOW (ref >60)
GFR calc Af Amer: 52 mL/min — ABNORMAL LOW (ref >60)
GFR calc non Af Amer: 43 mL/min — ABNORMAL LOW (ref >60)
GFR, EST NON AFRICAN AMERICAN: 42 mL/min — AB (ref >60)
GFR, EST NON AFRICAN AMERICAN: 50 mL/min — AB (ref >60)
GFR, EST NON AFRICAN AMERICAN: 44 mL/min — AB (ref >60)
GLUCOSE: 665 mg/dL — AB (ref 65–99)
GLUCOSE: 389 mg/dL — AB (ref 65–99)
Glucose, Bld: 804 mg/dL (ref 65–99)
Glucose, Bld: 471 mg/dL — ABNORMAL HIGH (ref 65–99)
POTASSIUM: 4.5 mmol/L (ref 3.5–5.1)
POTASSIUM: 3.9 mmol/L (ref 3.5–5.1)
Potassium: 5.3 mmol/L — ABNORMAL HIGH (ref 3.5–5.1)
Potassium: 4.4 mmol/L (ref 3.5–5.1)
SODIUM: 149 mmol/L — AB (ref 135–145)
Sodium: 146 mmol/L — ABNORMAL HIGH (ref 135–145)
Sodium: 150 mmol/L — ABNORMAL HIGH (ref 135–145)
Sodium: 152 mmol/L — ABNORMAL HIGH (ref 135–145)

## 2014-11-27 LAB — CBG MONITORING, ED

## 2014-11-27 LAB — URINALYSIS, ROUTINE W REFLEX MICROSCOPIC
Bilirubin Urine: NEGATIVE
Glucose, UA: >1000 mg/dL — AB
Ketones, ur: 15 mg/dL — AB
Leukocytes, UA: NEGATIVE
Nitrite: NEGATIVE
Protein, ur: NEGATIVE mg/dL
Specific Gravity, Urine: 1.039 — ABNORMAL HIGH (ref 1.005–1.030)
Urobilinogen, UA: 0.2 mg/dL (ref 0.0–1.0)
pH: 5 (ref 5.0–8.0)

## 2014-11-27 LAB — I-STAT VENOUS BLOOD GAS, ED
Acid-base deficit: 5 mmol/L — ABNORMAL HIGH (ref 0.0–2.0)
Bicarbonate: 21 mEq/L (ref 20.0–24.0)
O2 SAT: 64 %
TCO2: 22 mmol/L (ref 0–100)
pCO2, Ven: 41.6 mmHg — ABNORMAL LOW (ref 45.0–50.0)
pH, Ven: 7.311 — ABNORMAL HIGH (ref 7.250–7.300)
pO2, Ven: 36 mmHg (ref 30.0–45.0)

## 2014-11-27 LAB — MRSA PCR SCREENING: MRSA by PCR: NEGATIVE

## 2014-11-27 LAB — URINE MICROSCOPIC-ADD ON

## 2014-11-27 MED ORDER — DEXTROSE-NACL 5-0.45 % IV SOLN: INTRAVENOUS | Status: DC

## 2014-11-27 MED ORDER — SODIUM CHLORIDE 0.9 % IV BOLUS (SEPSIS)
1000.0000 mL | Freq: Once | INTRAVENOUS | Status: AC
  Administered 2014-11-27: 1000 mL via INTRAVENOUS

## 2014-11-27 MED ORDER — SODIUM CHLORIDE 0.9 % IV SOLN: INTRAVENOUS | Status: AC

## 2014-11-27 MED ORDER — INSULIN ASPART 100 UNIT/ML ~~LOC~~ SOLN
10.0000 [IU] | Freq: Once | SUBCUTANEOUS | Status: AC
  Administered 2014-11-27: 10 [IU] via SUBCUTANEOUS
  Filled 2014-11-27: qty 1

## 2014-11-27 MED ORDER — ONDANSETRON HCL 4 MG/2ML IJ SOLN
4.0000 mg | Freq: Once | INTRAMUSCULAR | Status: AC
  Administered 2014-11-27: 4 mg via INTRAVENOUS
  Filled 2014-11-27: qty 2

## 2014-11-27 MED ORDER — DEXTROSE-NACL 5-0.45 % IV SOLN
INTRAVENOUS | Status: DC
  Administered 2014-11-28: 11:00:00 via INTRAVENOUS
  Administered 2014-11-28: 1000 mL via INTRAVENOUS

## 2014-11-27 MED ORDER — DEXTROSE 50 % IV SOLN: 25.0000 mL | INTRAVENOUS | Status: DC | PRN

## 2014-11-27 MED ORDER — SODIUM CHLORIDE 0.9 % IV SOLN
INTRAVENOUS | Status: DC
  Administered 2014-11-27: 17:00:00 via INTRAVENOUS

## 2014-11-27 MED ORDER — INSULIN REGULAR HUMAN 100 UNIT/ML IJ SOLN
INTRAMUSCULAR | Status: DC
  Administered 2014-11-27: 5.4 [IU]/h via INTRAVENOUS
  Administered 2014-11-28: 10.6 [IU]/h via INTRAVENOUS
  Administered 2014-11-28: 10.9 [IU]/h via INTRAVENOUS
  Administered 2014-11-28: 9.3 [IU]/h via INTRAVENOUS
  Administered 2014-11-28: 8.5 [IU]/h via INTRAVENOUS
  Administered 2014-11-28: 6.7 [IU]/h via INTRAVENOUS
  Administered 2014-11-28: 8.4 [IU]/h via INTRAVENOUS
  Filled 2014-11-27: qty 2.5

## 2014-11-27 MED ORDER — SODIUM CHLORIDE 0.9 % IV SOLN: INTRAVENOUS | Status: DC

## 2014-11-27 MED ORDER — INSULIN REGULAR BOLUS VIA INFUSION
0.0000 [IU] | Freq: Three times a day (TID) | INTRAVENOUS | Status: DC
  Filled 2014-11-27: qty 10

## 2014-11-27 MED ORDER — LORATADINE 10 MG PO TABS
10.0000 mg | ORAL_TABLET | Freq: Every day | ORAL | Status: DC
  Administered 2014-11-27 – 2014-11-29 (×3): 10 mg via ORAL
  Filled 2014-11-27 (×3): qty 1

## 2014-11-27 MED ORDER — SODIUM CHLORIDE 0.9 % IV SOLN
INTRAVENOUS | Status: DC
  Administered 2014-11-27: 5.4 [IU]/h via INTRAVENOUS
  Filled 2014-11-27: qty 2.5

## 2014-11-27 MED ORDER — HEPARIN SODIUM (PORCINE) 5000 UNIT/ML IJ SOLN
5000.0000 [IU] | Freq: Three times a day (TID) | INTRAMUSCULAR | Status: DC
  Administered 2014-11-28 – 2014-11-29 (×6): 5000 [IU] via SUBCUTANEOUS
  Filled 2014-11-27 (×8): qty 1

## 2014-11-27 MED ORDER — KETOTIFEN FUMARATE 0.025 % OP SOLN: 1.0000 [drp] | Freq: Every day | OPHTHALMIC | Status: DC | PRN

## 2014-11-27 NOTE — ED Provider Notes (Signed)
CSN: [id number]     Arrival date & time 11/27/14  1428 History   First MD Initiated Contact with Patient 11/27/14 1537     Chief Complaint  Patient presents with  . Hypertension  . Hyperglycemia   (Consider location/radiation/quality/duration/timing/severity/associated sxs/prior Treatment) Patient is a 34 y.o. female presenting with hyperglycemia. The history is provided by the patient. No language interpreter was used.  Hyperglycemia Blood sugar level PTA:  >600 Severity:  Severe Onset quality:  Gradual Timing:  Constant Progression:  Worsening Chronicity:  New Diabetes status:  Non-diabetic Relieved by:  Nothing Ineffective treatments:  None tried Associated symptoms: diaphoresis, fatigue, increased thirst, malaise, nausea and polyuria   Associated symptoms: no abdominal pain, no blurred vision, no chest pain, no confusion, no dehydration, no dizziness, no dysuria, no fever, no shortness of breath, no syncope, no vomiting, no weakness and no weight change   Risk factors: family hx of diabetes and obesity     Past Medical History  Diagnosis Date  . Hypertension   . Diabetes mellitus without complication   . Migraines   . Asthma    History reviewed. No pertinent past surgical history. History reviewed. No pertinent family history. History  Substance Use Topics  . Smoking status: Never Smoker   . Smokeless tobacco: Not on file  . Alcohol Use: Yes   OB History    No data available     Review of Systems  Constitutional: Positive for diaphoresis, appetite change and fatigue. Negative for fever.  Eyes: Negative for blurred vision.  Respiratory: Negative for cough, chest tightness and shortness of breath.   Cardiovascular: Negative for chest pain and syncope.  Gastrointestinal: Positive for nausea. Negative for vomiting and abdominal pain.  Endocrine: Positive for polydipsia and polyuria.  Genitourinary: Negative for dysuria.  Neurological: Negative for dizziness,  weakness, light-headedness and headaches.  Psychiatric/Behavioral: Negative for confusion.  All other systems reviewed and are negative.     Allergies  Ibuprofen and Ketorolac  Home Medications   Prior to Admission medications   Not on File   BP 138/100 mmHg  Pulse 136  Temp(Src) 98.3 F (36.8 C) (Oral)  Resp 19  Ht 5\' 1"  (1.549 m)  Wt 216 lb (97.977 kg)  BMI 40.83 kg/m2  SpO2 96%   Physical Exam  Constitutional: She appears well-developed and well-nourished. She is active. She does not appear ill. No distress.  obese  HENT:  Head: Normocephalic and atraumatic.  Nose: Nose normal.  Mouth/Throat: Oropharynx is clear and moist. No oropharyngeal exudate.  Eyes: EOM are normal. Pupils are equal, round, and reactive to light.  Neck: Normal range of motion. Neck supple.  Cardiovascular: Regular rhythm, normal heart sounds and intact distal pulses.  Tachycardia present.   No murmur heard. Pulmonary/Chest: Effort normal and breath sounds normal. No respiratory distress. She has no wheezes. She exhibits no tenderness.  Abdominal: Soft. There is no tenderness. There is no rebound and no guarding.  Soft, nontender  Musculoskeletal: Normal range of motion. She exhibits no tenderness.  Lymphadenopathy:    She has no cervical adenopathy.  Neurological: She is alert. No cranial nerve deficit. Coordination normal.  Skin: Skin is warm and dry. She is not diaphoretic.  Psychiatric: She has a normal mood and affect. Her behavior is normal. Judgment and thought content normal.  Nursing note and vitals reviewed.   ED Course  Procedures (including critical care time) Labs Review Labs Reviewed  CBG MONITORING, ED - Abnormal; Notable for the following:  Glucose-Capillary >600 (*)    All other components within normal limits  CBC  COMPREHENSIVE METABOLIC PANEL  URINALYSIS, ROUTINE W REFLEX MICROSCOPIC (NOT AT [facility])  POC URINE PREG, ED    Imaging Review No results found.   EKG  Interpretation None      MDM   Final diagnoses:  Diabetic ketoacidosis without coma associated with type 2 diabetes mellitus  Diabetes mellitus, new onset   Pt is a 34 yo BF with no previous medical hx who presents with glucose > 600.  Complains of several days of fatigue, N/V, polyuria, polydypsia, and mental slugishness.  Still able to take care of her ADLs.  Today she felt general malaise so presented for evaluation and was found to have hyperglycemia.  Has a hx of "borderline DM and HTN" but no official diagnoses.  Recent dental cavity treated with amoxil, then developed vaginal and oral thrush.   Dad with DM.   Looks well but is tachycardic to 130s and slightly tachypneic in upper teens.  Obese.  Given NS bolus and labs were drawn to evaluate further.  VBG with pH 7.3, HCO3 21, so patient is not in DKA.  She was given 10 units of regular insulin subQ and 1 L NS while waiting on BMP to return.  Will re-check glucose in 1 hour to re-evaluate   BMP showed glucose 1200, elevated AG.   She was started on insulin drip per ED protocol.  Informed of new diagnosis of DM and that she is in DKA.  Given education and all questions were answered.    Will need admission for new onset DM with DKA.  Spoke to [facility] resident team at 1700.  To admit to Dr. Deirdre Priest in the stepdown unit.   If performed, labs, EKGs, and imaging were reviewed and interpreted by myself and my attending, and incorporated in the medical decision making.  Patient was seen with ED Attending, Dr. Sandi Mealy, MD   Lenell Antu, MD 11/28/14 1452  Nelva Nay, MD 12/11/14 [phone]

## 2014-11-27 NOTE — Progress Notes (Signed)
CRITICAL 2VALUE ALERT  Critical value received:  Serum Glucose (804)  Date of notification:  11/27/2014  Time of notification:  1837  Critical value read back:Yes.    Nurse who received alert:  Lovie Macadamia RN  MD notified (1st page): Notified Family Medicine  Time of first page:  1845  MD notified (2nd page):  Time of second page:  Responding MD:  Dr Alanda Slim  Time MD responded:  [phone]

## 2014-11-27 NOTE — ED Notes (Signed)
  CBG HI >600 on glucometer

## 2014-11-27 NOTE — Progress Notes (Signed)
11/27/2014 Patient transfer from [facility] emergency room to 2C04 at 1815. She is alert, oriented and was able to move from stretcher to bed. Patient did c/o of being dizzy while standing up to use the bedside commode. After using bedside commode Rn notice she had some blood in urine, patient stated she is on depo-provera. Rn did let notified Family medicine to let them aware. Patient came up from the emergency room on the Gluco stabilizer. Patient lower legs have some dark area on left breast she have a abrasion, also thrush on tongue. She was place on telemetry and on bed alarm.[facility] RN.

## 2014-11-27 NOTE — ED Notes (Signed)
She states her blood sugar and blood pressure have been elevated since she took medication for oral and vaginal thrush.  She states she was unable to eat and had episodes of vomiting during the thrush and she has felt bad since. She is A&Ox4, resp e/u

## 2014-11-27 NOTE — H&P (Signed)
[facility] [address] Admission History and Physical Service Pager: [phone]  Patient name: Shelly Shields Medical record number: [id number] Date of birth: [birth year] Age: 34 y.o. Gender: female  Primary Care Provider: No primary care provider on file. Consultants: none Code Status: full  Chief Complaint: hyperglycemia  Assessment and Plan: Shelly Shields is a 34 y.o. female who presents markedly elevated blood glucose suggestive for HHS vs DKA.   HHS/DM-2: Patient reports having polyuria, polydipsia, diarrhea and vomiting. Blood glucose of 1200 in ED. AG 18. UA: glucose 1000, Ketone 15. S/p 10u of regular insulin subq and 1L NS bolus in ED. Patient reports hx of borderline DM for the last 2 years. Physical exam +ve for acanthosis nigricans.  Patient alert and oriented. Unsure of precipitant. Upreg neg   - admit to step down, Dr. Deirdre Priest attending  - Continue insulin drip until blood glucose is less than 250 or AG < 12 - Continue aggressive hydration with NS at 1L/hr for two hours, then 150 ml/hr - Change IVF to D5-1/2NS with CBG < 250 - BMP every 2 hours  - switch to basal insulin once gap closes  - TSH and lipid panel pending  - F/u A1c  AGMA: AG of 18 likely 2/2 HHS - Continue insulin and IVF as above  AKI: sCr of 1.79. Likely contributing factors of hemoconcentration and volume depletion - Cotinue IVF as above  Transaminitis: mildly elevated. No comparison if acute vs chronic. Could be result of hemoconcentration     - repeat CMP   - if continues to be elevated then consider testing ferritin, Ab [place], viral testing   Hypertension: reports hx of borderline hypertension. not on any medication. BP is WNL - Continue to monitor BP - consider medication at discharge   Asthma: stable. Not on any medication.  Hx of migraine: stable now  FEN/GI: IVF as above/ NPO Prophylaxis: SubQ heparin   Disposition: admitted to step down on family medicine  teaching service for [facility]  History of Present Illness:  Shelly Shields is a 34 y.o. female who presents with elevated blood glucose.   Patient presented to urgent care earlier today for oral and vaginal thrush that hasn't responded to clotrimazole.  Upon lab testing she was found to have markedly elevated blood glucose.  On arrival to Ed her blood glucose were 600 from finger stick and 1200 from CMP.  Patient reports dry mouth, polydipsia, polyuria, nausea, vomiting and vulvar itching for the last week. She denies these symptoms prior to that although she reports borderline DM and borderline hypertension in the past.    She denies fever, vision changes, numbness in her legs, chest pain & abdominal pain. She endorses lightheadedness. Patient has no PCP. She has strong family hx of DM, hypertension and stroke (see family hx for detail)  While in ED, patient recieved 10 units of regular insulin subq and 1L of NS. Then, she was started on insulin drip. Patient has CMP with k of 5.6 bicarb of 20, sCr of 1.79 and mildly elevated liver enzymes.   Review Of Systems:  Per HPI.  Patient Active Problem List   Diagnosis Date Noted  . DKA (diabetic ketoacidoses) 11/27/2014  . Diabetes mellitus, new onset   . Hyperglycemic hyperosmolar nonketotic coma   . Essential hypertension   . Asthma    Past Medical History: Past Medical History  Diagnosis Date  . Hypertension   . Diabetes mellitus without complication   . Migraines   .  Asthma    Past Surgical History: History reviewed. No pertinent past surgical history. Social History: History  Substance Use Topics  . Smoking status: Never Smoker   . Smokeless tobacco: Not on file  . Alcohol Use: 0.0 oz/week    0 Standard drinks or equivalent per week     Comment: occasional    Additional social history:  Please also refer to relevant sections of EMR.  Family History: Family History  Problem Relation Age of Onset  . Hypertension Mother   .  Hypertension Father   . Stroke Father   . Diabetes Father   . Thyroid disease Mother    Allergies and Medications: Allergies  Allergen Reactions  . Ibuprofen   . Ketorolac    No current facility-administered medications on file prior to encounter.   No current outpatient prescriptions on file prior to encounter.    Objective: BP 141/78 mmHg  Pulse 124  Temp(Src) 98.1 F (36.7 C) (Oral)  Resp 15  Ht  (1.549 m)  Wt 207 lb 0.2 oz (93.9 kg)  BMI 39.13 kg/m2  SpO2 98% Exam: General: obese, able to sit up in bed for exam with NAD.  Eyes: PERRL, anicteric ENTM: oropharynx clear, tacky MM, lips chapped Neck: acanthosis nigricans Cardiovascular: RRR, no murmurs Respiratory: no WOB, CTAB Abdomen: soft. NTND, no CVA tenderness LE: no edema. DP pulses 2+ bilaterally Skin: no lesion Neuro: AAOX3   Labs and Imaging: CBC BMET   Recent Labs Lab 11/27/14 1510  WBC 11.5*  HGB 16.5*  HCT 51.0*  PLT 315    Recent Labs Lab 11/27/14 1755  NA 146*  K 4.5  CL 110  CO2 21*  BUN 17  CREATININE 1.56*  GLUCOSE 804*  CALCIUM 10.3     Urinalysis    Component Value Date/Time   COLORURINE YELLOW 11/27/2014 1517   APPEARANCEUR CLEAR 11/27/2014 1517   LABSPEC 1.039* 11/27/2014 1517   PHURINE 5.0 11/27/2014 1517   GLUCOSEU >1000* 11/27/2014 1517   HGBUR MODERATE* 11/27/2014 1517   BILIRUBINUR NEGATIVE 11/27/2014 1517   KETONESUR 15* 11/27/2014 1517   PROTEINUR NEGATIVE 11/27/2014 1517   UROBILINOGEN 0.2 11/27/2014 1517   NITRITE NEGATIVE 11/27/2014 1517   LEUKOCYTESUR NEGATIVE 11/27/2014 1517    Skipper Cliche, MD  11/27/2014, 8:17 PM PGY-1, [facility] FPTS Intern pager: [phone], text pages welcome  Upper Level Addendum:  I have seen and evaluated this patient along with Dr. Rulon Abide and reviewed the above note, making necessary revisions in [facility].   Clare Gandy, MD Family Medicine PGY-3

## 2014-11-28 DIAGNOSIS — E870 Hyperosmolality and hypernatremia: Secondary | ICD-10-CM

## 2014-11-28 DIAGNOSIS — J4520 Mild intermittent asthma, uncomplicated: Secondary | ICD-10-CM

## 2014-11-28 DIAGNOSIS — J45909 Unspecified asthma, uncomplicated: Secondary | ICD-10-CM | POA: Insufficient documentation

## 2014-11-28 LAB — BASIC METABOLIC PANEL
ANION GAP: 10 (ref 5–15)
ANION GAP: 11 (ref 5–15)
Anion gap: 9 (ref 5–15)
Anion gap: 11 (ref 5–15)
Anion gap: 9 (ref 5–15)
BUN: 16 mg/dL (ref 6–20)
BUN: 16 mg/dL (ref 6–20)
BUN: 15 mg/dL (ref 6–20)
BUN: 18 mg/dL (ref 6–20)
BUN: 16 mg/dL (ref 6–20)
CALCIUM: 9 mg/dL (ref 8.9–10.3)
CALCIUM: 8.7 mg/dL — AB (ref 8.9–10.3)
CHLORIDE: 119 mmol/L — AB (ref 101–111)
CHLORIDE: 123 mmol/L — AB (ref 101–111)
CO2: 24 mmol/L (ref 22–32)
CO2: 25 mmol/L (ref 22–32)
CO2: 25 mmol/L (ref 22–32)
CO2: 26 mmol/L (ref 22–32)
CO2: 21 mmol/L — ABNORMAL LOW (ref 22–32)
CREATININE: 1.09 mg/dL — AB (ref 0.44–1.00)
CREATININE: 1.49 mg/dL — AB (ref 0.44–1.00)
Calcium: 10.1 mg/dL (ref 8.9–10.3)
Calcium: 9.8 mg/dL (ref 8.9–10.3)
Calcium: 9.1 mg/dL (ref 8.9–10.3)
Chloride: 120 mmol/L — ABNORMAL HIGH (ref 101–111)
Chloride: 118 mmol/L — ABNORMAL HIGH (ref 101–111)
Chloride: 113 mmol/L — ABNORMAL HIGH (ref 101–111)
Creatinine, Ser: 1.39 mg/dL — ABNORMAL HIGH (ref 0.44–1.00)
Creatinine, Ser: 1.36 mg/dL — ABNORMAL HIGH (ref 0.44–1.00)
Creatinine, Ser: 1.16 mg/dL — ABNORMAL HIGH (ref 0.44–1.00)
GFR calc Af Amer: 57 mL/min — ABNORMAL LOW (ref >60)
GFR calc Af Amer: 58 mL/min — ABNORMAL LOW (ref >60)
GFR calc Af Amer: 52 mL/min — ABNORMAL LOW (ref >60)
GFR calc non Af Amer: 49 mL/min — ABNORMAL LOW (ref >60)
GFR calc non Af Amer: 50 mL/min — ABNORMAL LOW (ref >60)
GFR calc non Af Amer: 45 mL/min — ABNORMAL LOW (ref >60)
GLUCOSE: 229 mg/dL — AB (ref 65–99)
GLUCOSE: 184 mg/dL — AB (ref 65–99)
Glucose, Bld: 277 mg/dL — ABNORMAL HIGH (ref 65–99)
Glucose, Bld: 174 mg/dL — ABNORMAL HIGH (ref 65–99)
Glucose, Bld: 466 mg/dL — ABNORMAL HIGH (ref 65–99)
POTASSIUM: 3 mmol/L — AB (ref 3.5–5.1)
POTASSIUM: 3.7 mmol/L (ref 3.5–5.1)
POTASSIUM: 3.6 mmol/L (ref 3.5–5.1)
Potassium: 3.7 mmol/L (ref 3.5–5.1)
Potassium: 3.1 mmol/L — ABNORMAL LOW (ref 3.5–5.1)
SODIUM: 156 mmol/L — AB (ref 135–145)
Sodium: 156 mmol/L — ABNORMAL HIGH (ref 135–145)
Sodium: 154 mmol/L — ABNORMAL HIGH (ref 135–145)
Sodium: 153 mmol/L — ABNORMAL HIGH (ref 135–145)
Sodium: 145 mmol/L (ref 135–145)

## 2014-11-28 LAB — COMPREHENSIVE METABOLIC PANEL
ALK PHOS: 144 U/L — AB (ref 38–126)
ALT: 42 U/L (ref 14–54)
AST: 49 U/L — AB (ref 15–41)
Albumin: 3.8 g/dL (ref 3.5–5.0)
Anion gap: 10 (ref 5–15)
BUN: 16 mg/dL (ref 6–20)
CO2: 27 mmol/L (ref 22–32)
Calcium: 9.4 mg/dL (ref 8.9–10.3)
Chloride: 118 mmol/L — ABNORMAL HIGH (ref 101–111)
Creatinine, Ser: 1.4 mg/dL — ABNORMAL HIGH (ref 0.44–1.00)
GFR calc Af Amer: 56 mL/min — ABNORMAL LOW (ref >60)
GFR calc non Af Amer: 48 mL/min — ABNORMAL LOW (ref >60)
Glucose, Bld: 205 mg/dL — ABNORMAL HIGH (ref 65–99)
POTASSIUM: 3.5 mmol/L (ref 3.5–5.1)
Sodium: 155 mmol/L — ABNORMAL HIGH (ref 135–145)
Total Bilirubin: 0.7 mg/dL (ref 0.3–1.2)
Total Protein: 7.2 g/dL (ref 6.5–8.1)

## 2014-11-28 LAB — TSH: TSH: 1.968 u[IU]/mL (ref 0.350–4.500)

## 2014-11-28 LAB — GLUCOSE, CAPILLARY
GLUCOSE-CAPILLARY: 220 mg/dL — AB (ref 65–99)
GLUCOSE-CAPILLARY: 176 mg/dL — AB (ref 65–99)
GLUCOSE-CAPILLARY: 165 mg/dL — AB (ref 65–99)
GLUCOSE-CAPILLARY: 237 mg/dL — AB (ref 65–99)
GLUCOSE-CAPILLARY: 421 mg/dL — AB (ref 65–99)
GLUCOSE-CAPILLARY: 449 mg/dL — AB (ref 65–99)
GLUCOSE-CAPILLARY: 349 mg/dL — AB (ref 65–99)
Glucose-Capillary: 161 mg/dL — ABNORMAL HIGH (ref 65–99)
Glucose-Capillary: 166 mg/dL — ABNORMAL HIGH (ref 65–99)
Glucose-Capillary: 174 mg/dL — ABNORMAL HIGH (ref 65–99)
Glucose-Capillary: 180 mg/dL — ABNORMAL HIGH (ref 65–99)
Glucose-Capillary: 196 mg/dL — ABNORMAL HIGH (ref 65–99)
Glucose-Capillary: 205 mg/dL — ABNORMAL HIGH (ref 65–99)
Glucose-Capillary: 211 mg/dL — ABNORMAL HIGH (ref 65–99)
Glucose-Capillary: 218 mg/dL — ABNORMAL HIGH (ref 65–99)
Glucose-Capillary: 315 mg/dL — ABNORMAL HIGH (ref 65–99)

## 2014-11-28 LAB — LIPID PANEL
Cholesterol: 172 mg/dL (ref 0–200)
HDL: 32 mg/dL — AB (ref >40)
LDL Cholesterol: 97 mg/dL (ref 0–99)
Total CHOL/HDL Ratio: 5.4 RATIO
Triglycerides: 215 mg/dL — ABNORMAL HIGH (ref <150)
VLDL: 43 mg/dL — ABNORMAL HIGH (ref 0–40)

## 2014-11-28 MED ORDER — SUMATRIPTAN SUCCINATE 100 MG PO TABS
100.0000 mg | ORAL_TABLET | ORAL | Status: DC | PRN
  Administered 2014-11-28 – 2014-11-29 (×3): 100 mg via ORAL
  Filled 2014-11-28 (×5): qty 1

## 2014-11-28 MED ORDER — INSULIN ASPART 100 UNIT/ML ~~LOC~~ SOLN
0.0000 [IU] | Freq: Every day | SUBCUTANEOUS | Status: DC
  Administered 2014-11-28: 4 [IU] via SUBCUTANEOUS

## 2014-11-28 MED ORDER — MAGIC MOUTHWASH
2.0000 mL | Freq: Three times a day (TID) | ORAL | Status: DC
  Administered 2014-11-28 – 2014-11-29 (×4): 2 mL via ORAL
  Filled 2014-11-28 (×5): qty 5

## 2014-11-28 MED ORDER — DEXTROSE-NACL 5-0.45 % IV SOLN: INTRAVENOUS | Status: DC

## 2014-11-28 MED ORDER — INSULIN GLARGINE 100 UNIT/ML ~~LOC~~ SOLN
25.0000 [IU] | Freq: Every day | SUBCUTANEOUS | Status: DC
  Administered 2014-11-28: 25 [IU] via SUBCUTANEOUS
  Filled 2014-11-28 (×2): qty 0.25

## 2014-11-28 MED ORDER — PROMETHAZINE HCL 25 MG PO TABS: 25.0000 mg | ORAL_TABLET | Freq: Four times a day (QID) | ORAL | Status: DC | PRN

## 2014-11-28 MED ORDER — INSULIN ASPART 100 UNIT/ML ~~LOC~~ SOLN
0.0000 [IU] | Freq: Three times a day (TID) | SUBCUTANEOUS | Status: DC
  Administered 2014-11-28: 5 [IU] via SUBCUTANEOUS
  Administered 2014-11-29 (×2): 11 [IU] via SUBCUTANEOUS

## 2014-11-28 MED ORDER — MAGIC MOUTHWASH
2.0000 mL | Freq: Once | ORAL | Status: AC
  Administered 2014-11-28: 2 mL via ORAL
  Filled 2014-11-28: qty 5

## 2014-11-28 MED ORDER — INSULIN ASPART 100 UNIT/ML ~~LOC~~ SOLN
16.0000 [IU] | Freq: Once | SUBCUTANEOUS | Status: AC
  Administered 2014-11-28: 16 [IU] via SUBCUTANEOUS

## 2014-11-28 MED ORDER — POTASSIUM CHLORIDE CRYS ER 20 MEQ PO TBCR
40.0000 meq | EXTENDED_RELEASE_TABLET | Freq: Two times a day (BID) | ORAL | Status: AC
  Administered 2014-11-28 (×2): 40 meq via ORAL
  Filled 2014-11-28 (×2): qty 2

## 2014-11-28 NOTE — Progress Notes (Signed)
Received from [facility]. Alert and oriented, not in any distress, IV site in place. Oriented to unit, call light and staff. Will monitor.

## 2014-11-28 NOTE — Discharge Instructions (Addendum)
It has been a pleasure taking care of you! You were admitted with hyperosmolar hyperglycemic state, which is a medical name for markedly elevated glucose (sugar) in your blood. This happens in people with poorly managed diabetes mellitus. Signs and symptoms are dry mouth, frequent thirst, hunger and urination, fungal infections of the mouth and private areas, feeling tired and so on.  We gave you medications that has improved your condition while you were in the hospital. We are discharging you home on medications you need to take at home.  We strongly recommend you take your medications diligently.  We also like you to follow up with [place]. You can find the address, phone number and schedule time on the discharge paper under follow up.   Things to watch: Please, seek immidiate medical help, if you happen to have dry mouth, frequent thirst, hunger and urination, fungal infections of the mouth and private areas, feeling tired, chest pain, belly pain, fever and chills.  You should also watch out for signs of hypoglycemia which includes fatigue, lightheadedness, sweating, palpitation and anxiety.      Blood Glucose Monitoring Monitoring your blood glucose (also know as blood sugar) helps you to manage your diabetes. It also helps you and your health care provider monitor your diabetes and determine how well your treatment plan is working. WHY SHOULD YOU MONITOR YOUR BLOOD GLUCOSE?  It can help you understand how food, exercise, and medicine affect your blood glucose.  It allows you to know what your blood glucose is at any given moment. You can quickly tell if you are having low blood glucose (hypoglycemia) or high blood glucose (hyperglycemia).  It can help you and your health care provider know how to adjust your medicines.  It can help you understand how to manage an illness or adjust medicine for exercise. WHEN SHOULD YOU TEST? Your health care provider will help you decide how often you  should check your blood glucose. This may depend on the type of diabetes you have, your diabetes control, or the types of medicines you are taking. Be sure to write down all of your blood glucose readings so that this information can be reviewed with your health care provider. See below for examples of testing times that your health care provider may suggest. Type 1 Diabetes  Test 4 times a day if you are in good control, using an insulin pump, or perform multiple daily injections.  If your diabetes is not well controlled or if you are sick, you may need to monitor more often.  It is a good idea to also monitor:  Before and after exercise.  Between meals and 2 hours after a meal.  Occasionally between 2:00 a.m. and 3:00 a.m. Type 2 Diabetes  It can vary with each person, but generally, if you are on insulin, test 4 times a day.  If you take medicines by mouth (orally), test 2 times a day.  If you are on a controlled diet, test once a day.  If your diabetes is not well controlled or if you are sick, you may need to monitor more often. HOW TO MONITOR YOUR BLOOD GLUCOSE Supplies Needed  Blood glucose meter.  Test strips for your meter. Each meter has its own strips. You must use the strips that go with your own meter.  A pricking needle (lancet).  A device that holds the lancet (lancing device).  A journal or log book to write down your results. Procedure  Wash your hands  with soap and water. Alcohol is not preferred.  Prick the side of your finger (not the tip) with the lancet.  Gently milk the finger until a small drop of blood appears.  Follow the instructions that come with your meter for inserting the test strip, applying blood to the strip, and using your blood glucose meter. Other Areas to Get Blood for Testing Some meters allow you to use other areas of your body (other than your finger) to test your blood. These areas are called alternative sites. The most common  alternative sites are:  The forearm.  The thigh.  The back area of the lower leg.  The palm of the hand. The blood flow in these areas is slower. Therefore, the blood glucose values you get may be delayed, and the numbers are different from what you would get from your fingers. Do not use alternative sites if you think you are having hypoglycemia. Your reading will not be accurate. Always use a finger if you are having hypoglycemia. Also, if you cannot feel your lows (hypoglycemia unawareness), always use your fingers for your blood glucose checks. ADDITIONAL TIPS FOR GLUCOSE MONITORING  Do not reuse lancets.  Always carry your supplies with you.  All blood glucose meters have a 24-hour "hotline" number to call if you have questions or need help.  Adjust (calibrate) your blood glucose meter with a control solution after finishing a few boxes of strips. BLOOD GLUCOSE RECORD KEEPING It is a good idea to keep a daily record or log of your blood glucose readings. Most glucose meters, if not all, keep your glucose records stored in the meter. Some meters come with the ability to download your records to your home computer. Keeping a record of your blood glucose readings is especially helpful if you are wanting to look for patterns. Make notes to go along with the blood glucose readings because you might forget what happened at that exact time. Keeping good records helps you and your health care provider to work together to achieve good diabetes management.  Document Released: 05/16/2003 Document Revised: 09/27/2013 Document Reviewed: 10/05/2012 [facility] Patient Information 2015 [place], [place]. This information is not intended to replace advice given to you by your health care provider. Make sure you discuss any questions you have with your health care provider.

## 2014-11-28 NOTE — Progress Notes (Signed)
Utilization Review Completed.,  T7/08/2014  

## 2014-11-28 NOTE — Progress Notes (Signed)
Family Medicine Teaching Service Daily Progress Note Intern Pager: [phone]  Patient name: Shelly Shields Medical record number: [id number] Date of birth: April 26, 1981 Age: 34 y.o. Gender: female  Primary Care Provider: No primary care provider on file. Consultants: none Code Status: full  Pt Overview and Major Events to Date:  7/3: admitted with HHS 7/4: HHS resolved. Started on basal glucose  Assessment and Plan: Shelly Shields is a 34 y.o. female who presents markedly elevated blood glucose suggestive for HHS vs DKA. Patient has BG of 1200, serum osmolality of 340 & VBG of 7.31/41.6/21/36alls supportive for HSS vs DKA. However, AG (18) and urine ketone (15) are suggestive for the later.  HHS/DM-2: Patient reports having polyuria, polydipsia, diarrhea and vomiting. Patient reports hx of borderline DM for the last 2 years. Physical exam +ve for acanthosis nigricans.Patient alert and oriented. Unsure of precipitant. Upreg neg. Blood glucose of 1200 in ED. AG 18. Urine glucose 1000. Urine ketone 15. Status post aggressive IVF and insulin drip. Gap closed and CBG down to 174. - transfer to floor - Ct IVF to D5-1/2NS. Running at 138ml/hr - F/u CMP this AM  - BMP every 4 hours  - Start Lantus 20 units HS and 6 units Novolog before each meal - TSH WNL - F/u A1c  AGMA: resolved. AG 18 on admission, 11 now. Likely 2/2 HHS.  - S/p rigorous IVF and insulin drip  AKI: improving. sCr of 1.79>1.36. Unknown b/l. Likely contributing factors of hemoconcentration and volume depletion - Cotinue IVF as above - BMP as above  Hypernatremia: Na 155. Likely from rigorous IV NS. Calculated water deficit of 6 Ls. - encourage PO water. If no response, we will start free water  Transaminitis: resolved. mildly elevated but trended down this morning. No comparison if acute vs chronic. Could be result of hemoconcentration   Oral candidiasis: likely 2/2 poorly controlled glucose -continue magic  mouth wash.   Hypertension: reports hx of borderline hypertension. not on any medication. BP is WNL - Continue to monitor BP - consider medication at discharge   Dyslipidemia: HDL of 32. - won't act on this now  Vaginal bleeding: some blood noted by nurse in toilet tub. Patient is HDS. H&H are also stable. Patient reports that she is on Depo-provera - will monitor CBC - f/u as outpatient.  Asthma: stable. Not on any medication.  Hx of migraine: stable now  FEN/GI: IVF as above/ NPO  Prophylaxis: SubQ heparin   Disposition: admitted to step down on family medicine teaching service for [facility]  Subjective:  Patient reports significant improvement. She still complains about dry mouth, sore throat. She also reports intermittent leg cramps bilaterally which started a week ago. She likes to have PO liquid. She denies headache, chest pain, SOB and abdominal pain.  Objective: Temp:  [98 F (36.7 C)-98.7 F (37.1 C)] 98.7 F (37.1 C) (07/04 0721) Pulse Rate:  [101-137] 101 (07/04 0721) Resp:  [12-37] 12 (07/04 0721) BP: (115-141)/(78-102) 141/94 mmHg (07/04 0721) SpO2:  [96 %-100 %] 100 % (07/04 0721) Weight:  [207 lb 0.2 oz (93.9 kg)-216 lb (97.977 kg)] 207 lb 0.2 oz (93.9 kg) (07/03 1830)  Physical Exam: General: obese, able to sit up in bed for exam with NAD.  Eyes: PERRL, anicteric ENTM: oropharynx clear, tacky MM, lips chapped Neck: acanthosis nigricans Cardiovascular: RRR, no murmurs Respiratory: no WOB, CTAB Abdomen: BS+, mildly tender to deep palpation over LUQ, no mass, no CVA tenderness LE: no edema. DP pulses 2+  bilaterally Skin: no lesion Neuro: AAOX3  Laboratory:  Recent Labs Lab 11/27/14 1510  WBC 11.5*  HGB 16.5*  HCT 51.0*  PLT 315    Recent Labs Lab 11/27/14 1510  11/28/14 0056 11/28/14 0245 11/28/14 0732  NA 137  < > 156* 156* 155*  K 5.6*  < > 3.6 3.7 3.5  CL 99*  < > 123* 120* 118*  CO2 20*  < > 24 25 27   BUN 20  < > 18 16 16   CREATININE  1.79*  < > 1.39* 1.36* 1.40*  CALCIUM 11.0*  < > 10.1 9.8 9.4  PROT 8.4*  --   --   --  7.2  BILITOT 1.0  --   --   --  0.7  ALKPHOS 248*  --   --   --  144*  ALT 55*  --   --   --  42  AST 56*  --   --   --  49*  GLUCOSE 1200*  < > 277* 229* 205*  < > = values in this interval not displayed.   Imaging/Diagnostic Tests: No imaging in the last 24 hrs   Almon Hercules, MD 11/28/2014, 8:35 AM PGY-1, [facility] Family Medicine FPTS Intern pager: [phone], text pages welcome

## 2014-11-28 NOTE — Progress Notes (Signed)
MD paged and made aware of the cbg [phone].

## 2014-11-28 NOTE — Progress Notes (Signed)
Report called to V on [facility]North

## 2014-11-28 NOTE — Discharge Summary (Signed)
Physician Discharge Summary  Patient ID: Shelly Shields MRN: [id number] DOB/AGE: September 21, 1980 34 y.o.  Admit date: 11/27/2014 Discharge date: 11/29/2014  Admission Diagnoses: Hyperosmolar hyperglycemic state  Discharge Diagnoses:  Active Problems:   DKA (diabetic ketoacidoses)   Hyperglycemic hyperosmolar nonketotic coma   Hypernatremia   Asthma, chronic   DM-2  Discharged Condition: stable  Hospital Course:  Shelly Shields is a 34 y.o. female who was admitted to step-down unit with hyperosmolar hyperglycemic state (HHS). Patient has BG of 1200, serum osmolality of 340, VBG of 7.31/41.11/15/34 , AG 18 and urine ketone 15. Patient has A1c of 11.6. She was started on rigorous IVF and insulin drip. Blood glucose trended down to upper one hundreds and gap closed. She was transitioned to subcutaneous insulin and transferred to floor unit the following day where she remained stable. However, her blood glucose stayed in the range of 200-400. We increased her lantus from 25units to 35 units on HD-3. Her blood glucose was 305 prior to discharge.  Hypernatremia: corrected sodium in the range of 155-160. Patient was asymptomatic except for migraine headache which she had at baseline. Trended down to 146 up on discharge. We recommend follow up BMP as outpatient  Elevated creatinine: came in with sCr of 1.79 that has trended down to 1.27 before discharge. No baseline creatinine. Recommend follow up BMP  Consults: None  Significant Diagnostic Studies: A1c 11.6. TSH 1.96   Discharge Exam: Blood pressure 124/88, pulse 84, temperature 98.2 F (36.8 C), temperature source Oral, resp. rate 18, height _0  (1.549 m), weight 207 lb 0.2 oz (93.9 kg), SpO2 100 %.  Physical Exam:   General: obese, able to sit up in bed for exam with NAD.  Eyes: PERRL, anicteric ENTM: oropharynx clear, MMM Neck: acanthosis nigricans Cardiovascular: RRR, no murmurs Respiratory: no WOB, CTAB Abdomen: BS+, NTND, no  mass, no CVA tenderness LE: no edema. DP pulses 2+ bilaterally Skin: no lesion Neuro: AAOX3   Disposition: Final discharge disposition not confirmed     Medication List    STOP taking these medications        clotrimazole 10 MG troche  Commonly known as:  MYCELEX      TAKE these medications        ACCU-CHEK NANO SMARTVIEW W/DEVICE Kit  1 Device by Does not apply route 4 (four) times daily - after meals and at bedtime.     accu-chek softclix lancets  Use as instructed for blood glucose checks four times daily, before meals and at bedtime     fexofenadine 180 MG tablet  Commonly known as:  ALLEGRA  Take 180 mg by mouth daily.     fluticasone 50 MCG/ACT nasal spray  Commonly known as:  FLONASE  Place 2 sprays into both nostrils daily as needed for allergies.     glucose blood test strip  Commonly known as:  ACCU-CHEK SMARTVIEW  Check sugar 6 x daily     HYDROcodone-acetaminophen 7.5-325 MG per tablet  Commonly known as:  NORCO  Take 1 tablet by mouth every 6 (six) hours as needed for moderate pain.     Insulin Glargine 100 UNIT/ML Solostar Pen  Commonly known as:  LANTUS  Inject 35 Units into the skin daily.     Insulin Pen Needle 32G X 4 MM Misc  Commonly known as:  INSUPEN PEN NEEDLES  BD Pen Needles- brand specific. Inject insulin via insulin pen daily     ketotifen 0.025 % ophthalmic solution  Commonly  known as:  ZADITOR  Place 1 drop into both eyes daily as needed (dry eyes).     medroxyPROGESTERone 150 MG/ML injection  Commonly known as:  DEPO-PROVERA  Inject 150 mg into the muscle every 3 (three) months.     metFORMIN 500 MG tablet  Commonly known as:  GLUCOPHAGE  Take 1 tablet (500 mg total) by mouth 2 (two) times daily with a meal.     nystatin 100000 UNIT/ML suspension  Commonly known as:  MYCOSTATIN  Take 5 mLs (500,000 Units total) by mouth 4 (four) times daily.     promethazine 25 MG tablet  Commonly known as:  PHENERGAN  Take 25 mg by  mouth every 6 (six) hours as needed for nausea or vomiting.     pseudoephedrine 120 MG 12 hr tablet  Commonly known as:  SUDAFED  Take 120 mg by mouth daily as needed for congestion.     SUMAtriptan 100 MG tablet  Commonly known as:  IMITREX  Take 1 tablet (100 mg total) by mouth every 2 (two) hours as needed for migraine or headache.       Follow-up Information    Schedule an appointment as soon as possible for a visit with Mercy Riding, MD.   Specialty:  Internal Medicine   Why:  for hospital f/u   Contact information:   [address] [place] [address] [phone]      Signed: Mercy Riding 11/29/2014, 4:59 PM

## 2014-11-29 ENCOUNTER — Encounter (HOSPITAL_COMMUNITY): Payer: Self-pay

## 2014-11-29 ENCOUNTER — Telehealth: Payer: Self-pay | Admitting: *Deleted

## 2014-11-29 LAB — HEMOGLOBIN A1C
Hgb A1c MFr Bld: 11.6 % — ABNORMAL HIGH (ref 4.8–5.6)
Mean Plasma Glucose: 286 mg/dL

## 2014-11-29 LAB — GLUCOSE, CAPILLARY
Glucose-Capillary: 326 mg/dL — ABNORMAL HIGH (ref 65–99)
Glucose-Capillary: 305 mg/dL — ABNORMAL HIGH (ref 65–99)

## 2014-11-29 LAB — BASIC METABOLIC PANEL
Anion gap: 8 (ref 5–15)
BUN: 13 mg/dL (ref 6–20)
CO2: 23 mmol/L (ref 22–32)
CREATININE: 1.27 mg/dL — AB (ref 0.44–1.00)
Calcium: 8.5 mg/dL — ABNORMAL LOW (ref 8.9–10.3)
Chloride: 115 mmol/L — ABNORMAL HIGH (ref 101–111)
GFR calc non Af Amer: 54 mL/min — ABNORMAL LOW (ref >60)
Glucose, Bld: 283 mg/dL — ABNORMAL HIGH (ref 65–99)
POTASSIUM: 3.9 mmol/L (ref 3.5–5.1)
Sodium: 146 mmol/L — ABNORMAL HIGH (ref 135–145)

## 2014-11-29 LAB — OSMOLALITY: Osmolality: 321 mOsm/kg — ABNORMAL HIGH (ref 275–300)

## 2014-11-29 MED ORDER — ACCU-CHEK NANO SMARTVIEW W/DEVICE KIT: 1.0000 | PACK | Freq: Three times a day (TID) | Status: AC

## 2014-11-29 MED ORDER — SUMATRIPTAN SUCCINATE 100 MG PO TABS: 100.0000 mg | ORAL_TABLET | ORAL | Status: DC | PRN

## 2014-11-29 MED ORDER — GLUCOSE BLOOD VI STRP: ORAL_STRIP | Status: AC

## 2014-11-29 MED ORDER — ACCU-CHEK FASTCLIX LANCETS MISC: 1.0000 | Freq: Three times a day (TID) | Status: DC

## 2014-11-29 MED ORDER — INSULIN GLARGINE 100 UNIT/ML ~~LOC~~ SOLN
35.0000 [IU] | Freq: Every day | SUBCUTANEOUS | Status: DC
  Administered 2014-11-29: 35 [IU] via SUBCUTANEOUS
  Filled 2014-11-29: qty 0.35

## 2014-11-29 MED ORDER — LIVING WELL WITH DIABETES BOOK
Freq: Once | Status: AC
  Administered 2014-11-29: 15:00:00
  Filled 2014-11-29: qty 1

## 2014-11-29 MED ORDER — INSULIN GLARGINE 100 UNIT/ML SOLOSTAR PEN: 35.0000 [IU] | PEN_INJECTOR | Freq: Every day | SUBCUTANEOUS | Status: DC

## 2014-11-29 MED ORDER — INSULIN PEN NEEDLE 32G X 4 MM MISC: Status: AC

## 2014-11-29 MED ORDER — METFORMIN HCL 500 MG PO TABS
500.0000 mg | ORAL_TABLET | Freq: Two times a day (BID) | ORAL | Status: DC
Start: 2014-11-29 — End: 2014-12-28

## 2014-11-29 MED ORDER — INSULIN STARTER KIT- PEN NEEDLES (ENGLISH)
1.0000 | Freq: Once | Status: AC
  Administered 2014-11-29: 1
  Filled 2014-11-29: qty 1

## 2014-11-29 MED ORDER — NYSTATIN 100000 UNIT/ML MT SUSP: 5.0000 mL | Freq: Four times a day (QID) | OROMUCOSAL | Status: DC

## 2014-11-29 MED ORDER — INSULIN GLARGINE 100 UNIT/ML ~~LOC~~ SOLN: 35.0000 [IU] | Freq: Every day | SUBCUTANEOUS | Status: DC

## 2014-11-29 MED ORDER — ACCU-CHEK SOFTCLIX LANCET DEV MISC: Status: AC

## 2014-11-29 NOTE — Progress Notes (Addendum)
Inpatient Diabetes Program Recommendations  AACE/ADA: New Consensus Statement on Inpatient Glycemic Control (2013)  Target Ranges:  Prepandial:   less than 140 mg/dL      Peak postprandial:   less than 180 mg/dL (1-2 hours)      Critically ill patients:  140 - 180 mg/dL    Inpatient Diabetes Program Recommendations Insulin - Basal: Agree with increase in basal lantus to 35 units (0.4 units/kg=37 units)  May need increase in correction-will follow and order in-pt education per dietician, system network videos, DM education book from pharmacy. RN's and CNA's to teach patient  and reinforce learning while here. Will talk with patient as well as follow  Will need follow-up as OP and OP education. ADTaught the patient how to use an insulin pen using teach-back method. Pt very able to learn and demonstrated understanding. Pt states she will follow up with an MD near her work. Will order OP education as well. Pt is able to  Understand a correction/SSI before meals. 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

## 2014-11-29 NOTE — Progress Notes (Signed)
Discharge home. Home discharge instruction given, no question verbalized. 

## 2014-11-29 NOTE — Progress Notes (Signed)
Family Medicine Teaching Service Daily Progress Note Intern Pager: [phone]  Patient name: Shelly Shields Medical record number: [id number] Date of birth: [birth year] Age: 34 y.o. Gender: female  Primary Care Provider: No primary care provider on file. Consultants: none Code Status: full  Pt Overview and Major Events to Date:  7/3: admitted with HHS 7/4: HHS resolved. Started on basal glucose  Assessment and Plan: Shelly Shields is a 34 y.o. female who presents markedly elevated blood glucose suggestive for HHS vs DKA. Patient has BG of 1200, serum osmolality of 340 & VBG of 7.31/41.6/21/36alls supportive for HSS vs DKA. However, AG (18) and urine ketone (15) are suggestive for the later.  HHS/DM-2: Patient reports having polyuria, polydipsia, diarrhea and vomiting. Patient reports hx of borderline DM for the last 2 years. Physical exam +ve for acanthosis nigricans.Patient alert and oriented. Unsure of precipitant. Upreg neg. Blood glucose of 1200 in ED. AG 18. Urine glucose 1000. Urine ketone 15. Status post aggressive IVF and insulin drip. Gap closed and CBG down to 174. Started on basal insulin and transferred to floor on 7/4. Continued to have elevated blood glucose in the range of 200's to 400's. A1c 11.6. - BMP every 4 hours  - Increase Lantus from 25u to 35u - TSH WNL  AGMA: resolved. AG 18 on admission, 11 now. Likely 2/2 HHS.  - S/p rigorous IVF and insulin drip  AKI: improving. sCr of 1.79>1.36>1.16>1.49>1.27. No b/l Cr. Unknown b/l. Likely contributing factors of volume depletion - BMP as above  Hypernatremia: resolved. corrected Na in 156 on admission. Down to 145 this AM. Not clear if this is from HHS or ADH issue - BMP as above  Transaminitis: resolved. mildly elevated but trended down this morning. No comparison if acute vs chronic. Could be result of hemoconcentration   Oral candidiasis: likely 2/2 poorly controlled glucose -continue magic mouth  wash.  Hypertension: reports hx of borderline hypertension. not on any medication. BP is WNL - Continue to monitor BP - consider medication at discharge   Dyslipidemia: HDL of 32. - won't act on this now  Vaginal bleeding: some blood noted by nurse in toilet tub. Patient is HDS. H&H are also stable. Patient reports that she is on Depo-provera - f/u as outpatient.  Asthma: stable. Not on any medication.  Hx of migraine: stable now  FEN/GI: IVF as above/ NPO  Prophylaxis: SubQ heparin   Disposition: floor. Could go home tomorrow Subjective:  Pateint reports some headache, 5/10 this AM. Pain similar to her migraine. Endorses some nausea. She reports that she gets nausea whenever she takes Imitrex. Improved from yesterday. She has hx of migraine headache. Imitrex helped with pain. Denies SOB, chest pain or abdominal pain.  Objective: Temp:  [97.4 F (36.3 C)-98.6 F (37 C)] 98.4 F (36.9 C) (07/05 0957) Pulse Rate:  [88-102] 92 (07/05 0957) Resp:  [16-20] 18 (07/05 0957) BP: (127-146)/(84-95) 132/86 mmHg (07/05 0957) SpO2:  [99 %-100 %] 100 % (07/05 0957)  Physical Exam:  General: obese, able to sit up in bed for exam with NAD.  Eyes: PERRL, anicteric ENTM: oropharynx clear, MMM Neck: acanthosis nigricans Cardiovascular: RRR, no murmurs Respiratory: no WOB, CTAB Abdomen: BS+, NTND, no mass, no CVA tenderness LE: no edema. DP pulses 2+ bilaterally Skin: no lesion Neuro: AAOX3 Laboratory:  Recent Labs Lab 11/27/14 1510  WBC 11.5*  HGB 16.5*  HCT 51.0*  PLT 315    Recent Labs Lab 11/27/14 1510  11/28/14 0732  11/28/14  1427 11/28/14 2010 11/29/14 0424  NA 137  < > 155*  < > 153* 145 146*  K 5.6*  < > 3.5  < > 3.1* 3.7 3.9  CL 99*  < > 118*  < > 118* 113* 115*  CO2 20*  < > 27  < > 26 21* 23  BUN 20  < > 16  < > CREATININE 1.79*  < > 1.40*  < > 1.16* 1.49* 1.27*  CALCIUM 11.0*  < > 9.4  < > 9.0 8.7* 8.5*  PROT 8.4*  --  7.2  --   --   --   --    BILITOT 1.0  --  0.7  --   --   --   --   ALKPHOS 248*  --  144*  --   --   --   --   ALT 55*  --  42  --   --   --   --   AST 56*  --  49*  --   --   --   --   GLUCOSE 1200*  < > 205*  < > 184* 466* 283*  < > = values in this interval not displayed.   Imaging/Diagnostic Tests:   No imaging in the last 24 hrs.  Almon Hercules, MD 11/29/2014, 1:14 PM PGY-1, [facility] Family Medicine FPTS Intern pager: [phone], text pages welcome

## 2014-11-29 NOTE — Telephone Encounter (Signed)
Prior Authorization received from [facility] pharmacy for [employer]. Please change lancets to Accu-Chek Softclix lancets.  Clovis Pu, RN

## 2014-12-08 ENCOUNTER — Inpatient Hospital Stay: Payer: BLUE CROSS/BLUE SHIELD | Admitting: Student

## 2014-12-22 ENCOUNTER — Inpatient Hospital Stay: Payer: BLUE CROSS/BLUE SHIELD | Admitting: Student

## 2014-12-28 ENCOUNTER — Encounter: Payer: Self-pay | Admitting: Student

## 2014-12-28 ENCOUNTER — Ambulatory Visit (INDEPENDENT_AMBULATORY_CARE_PROVIDER_SITE_OTHER): Payer: BLUE CROSS/BLUE SHIELD | Admitting: Student

## 2014-12-28 VITALS — BP 116/89 | HR 90 | Temp 98.3°F | Ht 61.0 in | Wt 230.0 lb

## 2014-12-28 DIAGNOSIS — J4520 Mild intermittent asthma, uncomplicated: Secondary | ICD-10-CM

## 2014-12-28 DIAGNOSIS — E119 Type 2 diabetes mellitus without complications: Secondary | ICD-10-CM | POA: Diagnosis not present

## 2014-12-28 DIAGNOSIS — I10 Essential (primary) hypertension: Secondary | ICD-10-CM | POA: Diagnosis not present

## 2014-12-28 LAB — GLUCOSE, CAPILLARY: Glucose-Capillary: 93 mg/dL (ref 65–99)

## 2014-12-28 NOTE — Progress Notes (Signed)
   Subjective:    Patient ID: Shelly Shields, female    DOB: 09-Jul-1980, 34 y.o.   MRN: [id number]  HPI  No concern today. Denies polydipsia, polyuria or dry mouth Pt picked PCP  (Dr. William Hamburger) after hospitalization. She came to the clinic thinking that she should come and follow up for her recent hospitalization here. She reports that Dr. William Hamburger has adjusted her insulin and metformin. She says she will be following up with her.   Review of Systems     Objective:   Physical Exam Filed Vitals:   12/28/14 1552  BP: 116/89  Pulse: 90  Temp: 98.3 F (36.8 C)  TempSrc: Oral  Height: 5\' 1"  (1.549 m)  Weight: 230 lb (104.327 kg)   GEN: well-appearing, obese with NAD    Assessment & Plan:  No complaint. Blood glucose appears to be well controlled. CBG 93 here. Will continue to see Dr. William Hamburger (as PCP)

## 2014-12-28 NOTE — Patient Instructions (Signed)
It was great seeing you today! I am happy to hear that everything has gone well for you since your hospitalization.   Your blood glucose is within normal limit today. Since you have already seen Dr. Duanne Guess recently, you may not need to come back. However, we are always happy to have you whenever you feel like coming back to [place].   Please check-out at the front desk before leaving the clinic.   Take Care,   Dr. Candelaria Stagers

## 2015-02-23 ENCOUNTER — Ambulatory Visit (INDEPENDENT_AMBULATORY_CARE_PROVIDER_SITE_OTHER): Payer: BLUE CROSS/BLUE SHIELD | Admitting: Neurology

## 2015-02-23 ENCOUNTER — Encounter: Payer: Self-pay | Admitting: Neurology

## 2015-02-23 VITALS — BP 122/86 | HR 78 | Resp 20 | Ht 61.0 in | Wt 233.0 lb

## 2015-02-23 DIAGNOSIS — E662 Morbid (severe) obesity with alveolar hypoventilation: Secondary | ICD-10-CM

## 2015-02-23 DIAGNOSIS — G4730 Sleep apnea, unspecified: Secondary | ICD-10-CM

## 2015-02-23 DIAGNOSIS — R51 Headache: Secondary | ICD-10-CM | POA: Diagnosis not present

## 2015-02-23 DIAGNOSIS — T732XXA Exhaustion due to exposure, initial encounter: Secondary | ICD-10-CM

## 2015-02-23 DIAGNOSIS — R519 Headache, unspecified: Secondary | ICD-10-CM

## 2015-02-23 NOTE — Progress Notes (Signed)
SLEEP MEDICINE CLINIC   Provider:  Larey Shields, M D  Referring Provider: Fanny Bien, MD Primary Care Physician:  Shelly Cipro, MD  Chief Complaint  Patient presents with  . sleep consult    snoring, suspected osa, rm 10, alone   Chief complaint according to patient : " I stop breathing when I am asleep and fall asleep easily in day time"  HPI:  Shelly Shields is a 34 y.o. female , seen here as a referral from Shelly Shields for a sleep consultation,  Shelly Shields reports that she has been told by others  ( her mother )that she has sleep apnea. The patient's father had the same problem he suffered a stroke at a young age, 68. She's also concerned about her daytime sleepiness and fatigue. In spite of her young age she has been treated for hypertension and diabetes. She further carries a diagnosis of asthma and migraines without status migrainosus. She is considered at this time morbidly obese due to the elevated body mass index. Many of these symptoms can also be worsened by sleep apnea she has learned. She has gone through her primary care physician that diabetes and hypertension can be exacerbated by untreated sleep apnea as well as a higher risk of stroke and arryhthmia .  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  She aims for 7 hours of nocturnal sleep usually she goes to bed around 11 PM and she will be very promptly finding herself asleep.  She will have one or 2 times at night the urge to urinate. She usually can go back to sleep quickly. She prefers to sleep prone, usually she finds herself in another position when she wakes up, often supine. She has to arise at around 6 AM either for school or for work. 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 When she wakes up she feels as if she hasn't rested at all she feels neither refreshed nor restored often has a pressure headache a dry mouth and overall craves another hour of  2 of sleep.  Waking up is not easy for her. She has to rely on an alarm she cannot spontaneously wake up at the desired time. She keeps usually is juice or water on her bed stand to treat her dry mouth. She does not drink coffee but some sodas in daytime and iced tea. This is for all 3 meals of the day. She commutes 30 minutes to work, she works at [employer] and has daylight exposure at the drop of window.   Sleep medical history and family sleep history: no ENT surgery , no neck or facial injuries.    Father has OSA, was a loud snorer before CPAP , and suffered a CVA age 3.  Social history: She may drink 1 alcoholic beverage per month, she does not smoke and she does not use illegal drugs. 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  Review of Systems: Out of a complete 14 system review, the patient complains of only the following symptoms, and all other reviewed systems are negative.   Epworth score  16 , Fatigue severity score 35  , depression score 2   Social History   Social History  . Marital Status: Single    Spouse Name: N/A  . Number of Children: N/A  . Years of Education: N/A   Occupational History  . Not on file.  Social History Main Topics  . Smoking status: Never Smoker   . Smokeless tobacco: Not on file  . Alcohol Use: 0.0 oz/week    0 Standard drinks or equivalent per week     Comment: occasional   . Drug Use: No  . Sexual Activity: Not on file   Other Topics Concern  . Not on file   Social History Narrative   Lives in [place]: School and working    Family History  Problem Relation Age of Onset  . Hypertension Mother   . Thyroid disease Mother   . Hypertension Father   . Stroke Father   . Diabetes Father   . Sleep apnea Father     Past Medical History  Diagnosis Date  . Hypertension   . Diabetes mellitus without complication   . Migraines   . Asthma   . Obesity     History reviewed. No  pertinent past surgical history.  Current Outpatient Prescriptions  Medication Sig Dispense Refill  . Blood Glucose Monitoring Suppl (ACCU-CHEK NANO SMARTVIEW) W/DEVICE KIT 1 Device by Does not apply route 4 (four) times daily - after meals and at bedtime. 1 kit 0  . fexofenadine (ALLEGRA) 180 MG tablet Take 180 mg by mouth daily.    . fluticasone (FLONASE) 50 MCG/ACT nasal spray Place 2 sprays into both nostrils daily as needed for allergies.   3  . glucose blood (ACCU-CHEK SMARTVIEW) test strip Check sugar 6 x daily 200 each 3  . Insulin Glargine (LANTUS) 100 UNIT/ML Solostar Pen Inject 35 Units into the skin daily. 15 mL 11  . Insulin Pen Needle (INSUPEN PEN NEEDLES) 32G X 4 MM MISC BD Pen Needles- brand specific. Inject insulin via insulin pen daily 200 each 3  . ketotifen (ZADITOR) 0.025 % ophthalmic solution Place 1 drop into both eyes daily as needed (dry eyes).    Elmore Guise Devices (ACCU-CHEK SOFTCLIX) lancets Use as instructed for blood glucose checks four times daily, before meals and at bedtime 100 each 5  . losartan (COZAAR) 25 MG tablet Take 25 mg by mouth daily.    . medroxyPROGESTERone (DEPO-PROVERA) 150 MG/ML injection Inject 150 mg into the muscle every 3 (three) months.  3  . metFORMIN (GLUCOPHAGE-XR) 500 MG 24 hr tablet TK 2 TS PO DAILY  0  . nystatin (MYCOSTATIN) 100000 UNIT/ML suspension Take 5 mLs (500,000 Units total) by mouth 4 (four) times daily. 60 mL 1  . promethazine (PHENERGAN) 25 MG tablet Take 25 mg by mouth every 6 (six) hours as needed for nausea or vomiting.    . pseudoephedrine (SUDAFED) 120 MG 12 hr tablet Take 120 mg by mouth daily as needed for congestion.    . SUMAtriptan (IMITREX) 100 MG tablet Take 1 tablet (100 mg total) by mouth every 2 (two) hours as needed for migraine or headache. 10 tablet 1  . TRUEPLUS LANCETS 33G MISC USE TO CHECK BLOOD GLUCOSE QID - AFTER MEALS AND AT BEDTIME  3  . VENTOLIN HFA 108 (90 BASE) MCG/ACT inhaler Use as directed 2 puffs  in the mouth or throat 4 (four) times daily as needed.  3   No current facility-administered medications for this visit.    Allergies as of 02/23/2015 - Review Complete 02/23/2015  Allergen Reaction Noted  . Ibuprofen  03/04/2013  . Ketorolac  11/27/2014    Vitals: BP 122/86 mmHg  Pulse 78  Resp 20  Ht _0  (1.549 m)  Wt 233 lb (  105.688 kg)  BMI 44.05 kg/m2 Last Weight:  Wt Readings from Last 1 Encounters:  02/23/15 233 lb (105.688 kg)   VFI:EPPI mass index is 44.05 kg/(m^2).     Last Height:   Ht Readings from Last 1 Encounters:  02/23/15 $RemoveB'5\' 1"'KjrtVRhE$  (1.549 m)    Physical exam:  General: The patient is awake, alert and appears not in acute distress. The patient is well groomed. Head: Normocephalic, atraumatic. Neck is supple. Mallampati 4 neck circumference: 18. Nasal airflow unrestricted , TMJ not evident. Retrognathia is seen.  Cardiovascular:  Regular rate and rhythm , without  murmurs or carotid bruit, and without distended neck veins. Respiratory: Lungs are clear to auscultation. Skin:  Without evidence of edema, or rash. Discoloration of skin at the ankle level bilaterally , hyperpigemtnation Trunk: BMI is elevated   The patient's posture is erect.  Neurologic exam : The patient is awake and alert, oriented to place and time.   Memory subjective described as intact.  Attention span & concentration ability appears normal.  Speech is fluent,  without dysarthria, dysphonia or aphasia.  Mood and affect are appropriate.  Cranial nerves: Pupils are equal and briskly reactive to light. Funduscopic exam without evidence of pallor or edema. Extraocular movements in vertical and horizontal planes intact and without nystagmus. Visual fields by finger perimetry are intact. Hearing to finger rub intact.   Facial sensation intact to fine touch.  Facial motor strength is symmetric and tongue and uvula move midline.  Shoulder shrug was symmetrical.   Motor exam:  Normal tone,  muscle bulk and symmetric strength in all extremities.  Sensory:  Fine touch, pinprick and vibration were tested in all extremities. Proprioception tested in the upper extremities was normal.  Coordination: Rapid alternating movements in the fingers/hands was normal. Finger-to-nose maneuver  normal without evidence of ataxia, dysmetria or tremor.  Gait and station: Patient walks without assistive device and is able unassisted to climb up to the exam table. Strength within normal limits.  Stance is stable and normal. Toe and heel stand were wider based, Tandem gait is unfragmented. Turns with 3 Steps. Romberg testing is negative.  Deep tendon reflexes: in the  upper and lower extremities are symmetric and intact. Babinski maneuver response is  downgoing.  The patient was advised of the nature of the diagnosed sleep disorder , the treatment options and risks for general a health and wellness arising from not treating the condition.  I spent more than 40 minutes of face to face time with the patient. Greater than 50% of time was spent in counseling and coordination of care. We have discussed the diagnosis and differential and I answered the patient's questions.     Assessment:  After physical and neurologic examination, review of laboratory studies,  Personal review of imaging studies, reports of other /same  Imaging studies ,  Results of polysomnography/ neurophysiology testing and pre-existing records as far as provided in visit., my assessment is   1) reported apnea and snoring witnessd by her mother. Besides the clinical reports there are also several features of the physical exam that indicate the patient is at high risk for obstructive sleep apnea. Her main concern are the Mallampati, neck circumference, and the elevated body mass index.   2) the patient reports hypersomnia for quite significant degree. She is also fatigued. She wakes up with a dry mouth unrefreshed unrestored all this can  point to apnea. Since she has a comorbidity of asthma, diabetes, and hypertension it would  be important to also address morning headaches , obtaining a capnography during the night.  3) the patients primary care physician, Dr. Rachell Shields, is working with her on weight reduction. I think Mrs. Telleria is a good chance of finding a suitable treatment for a sleep apnea and gaining more energy which will help her to exercise. I also hope that this will positively influence hypertension and her diabetes and motivator for further weight loss. Would recommend a moderate carb diet.    Rv after sleep study. RV in 4-6 weeks, depending on PSG appointment schedule.     Asencion Partridge  MD  02/23/2015   CC: Shelly Shields, [place] [address] [place], [place] [address]

## 2015-02-23 NOTE — Patient Instructions (Signed)
polysomnoPolysomnography (Sleep Studies) Polysomnography (PSG) is a series of tests used for detecting (diagnosing) obstructive sleep apnea and other sleep disorders. The tests measure how some parts of your body are working while you are sleeping. The tests are extensive and expensive. They are done in a sleep lab or hospital, and vary from center to center. Your caregiver may perform other more simple sleep studies and questionnaires before doing more complete and involved testing. Testing may not be covered by insurance. Some of these tests are:  An EEG (Electroencephalogram). This tests your brain waves and stages of sleep.  An EOG (Electrooculogram). This measures the movements of your eyes. It detects periods of REM (rapid eye movement) sleep, which is your dream sleep.  An EKG (Electrocardiogram). This measures your heart rhythm.  EMG (Electromyography). This is a measurement of how the muscles are working in your upper airway and your legs while sleeping.  An oximetry measurement. It measures how much oxygen (air) you are getting while sleeping.  Breathing efforts may be measured. The same test can be interpreted (understood) differently by different caregivers and centers that study sleep.  Studies may be given an apnea/hypopnea index (AHI). This is a number which is found by counting the times of no breathing or under breathing during the night, and relating those numbers to the amount of time spent in bed. When the AHI is greater than 15, the patient is likely to complain of daytime sleepiness. When the AHI is greater than 30, the patient is at increased risk for heart problems and must be followed more closely. Following the AHI also allows you to know how treatment is working. Simple oximetry (tracking the amount of oxygen that is taken in) can be used for screening patients who:  Do not have symptoms (problems) of OSA.  Have a normal Epworth Sleepiness Scale Score.  Have a low  pre-test probability of having OSA.  Have none of the upper airway problems likely to cause apnea.  Oximetry is also used to determine if treatment is effective in patients who showed significant desaturations (not getting enough oxygen) on their home sleep study. One extra measure of safety is to perform additional studies for the person who only snores. This is because no one can predict with absolute certainty who will have OSA. Those who show significant desaturations (not getting enough oxygen) are recommended to have a more detailed sleep study. Document Released: 11/17/2002 Document Revised: 08/05/2011 Document Reviewed: 07/19/2013 [facility] Patient Information 2015 [place], [place]. This information is not intended to replace advice given to you by your health care provider. Make sure you discuss any questions you have with your health care provider.

## 2015-03-17 ENCOUNTER — Ambulatory Visit (INDEPENDENT_AMBULATORY_CARE_PROVIDER_SITE_OTHER): Payer: BLUE CROSS/BLUE SHIELD | Admitting: Neurology

## 2015-03-17 DIAGNOSIS — E662 Morbid (severe) obesity with alveolar hypoventilation: Secondary | ICD-10-CM

## 2015-03-17 DIAGNOSIS — T732XXA Exhaustion due to exposure, initial encounter: Secondary | ICD-10-CM

## 2015-03-17 DIAGNOSIS — R519 Headache, unspecified: Secondary | ICD-10-CM

## 2015-03-17 DIAGNOSIS — R51 Headache: Secondary | ICD-10-CM

## 2015-03-17 DIAGNOSIS — G4730 Sleep apnea, unspecified: Secondary | ICD-10-CM | POA: Diagnosis not present

## 2015-03-18 NOTE — Sleep Study (Signed)
Please see the scanned sleep study interpretation located in the procedure tab within the chart review section.   

## 2015-03-27 ENCOUNTER — Telehealth: Payer: Self-pay

## 2015-03-27 DIAGNOSIS — G4733 Obstructive sleep apnea (adult) (pediatric): Secondary | ICD-10-CM

## 2015-03-27 NOTE — Telephone Encounter (Signed)
Patient called back, hoping to catch Kristen before the end of the day regarding sleep study results. Please call [phone].

## 2015-03-27 NOTE — Telephone Encounter (Signed)
Called pt to give her sleep study results. No answer, left a message asking her to call me back. 

## 2015-03-27 NOTE — Telephone Encounter (Signed)
Advised pt that severe osa was seen in her sleep study and Dr. Vickey Huger recommends proceeding with an urgent cpap titration study. Pt is agreeable. Order placed Pt verbalized understanding.

## 2015-03-27 NOTE — Telephone Encounter (Signed)
Pt returned Kristen's call. She can be reached anytime after 3pm today

## 2015-04-28 ENCOUNTER — Ambulatory Visit (INDEPENDENT_AMBULATORY_CARE_PROVIDER_SITE_OTHER): Payer: BLUE CROSS/BLUE SHIELD | Admitting: Neurology

## 2015-04-28 DIAGNOSIS — G4733 Obstructive sleep apnea (adult) (pediatric): Secondary | ICD-10-CM | POA: Diagnosis not present

## 2015-04-29 NOTE — Sleep Study (Signed)
Please see the scanned sleep study interpretation located in the procedure tab within the chart review section.   

## 2015-05-03 ENCOUNTER — Telehealth: Payer: Self-pay

## 2015-05-03 DIAGNOSIS — G4733 Obstructive sleep apnea (adult) (pediatric): Secondary | ICD-10-CM

## 2015-05-03 NOTE — Telephone Encounter (Signed)
Called to discuss sleep study results with pt. No answer, left a message asking that she call me back.

## 2015-05-04 NOTE — Telephone Encounter (Signed)
Spoke to Shelly Shields and advised her that Dr. Vickey Huger reviewed her cpap titration results and does advise the Shelly Shields to start a cpap. Shelly Shields is agreeable. She states that she feel better than she had in a long time after using the cpap. I advised her that a respironics dream station auto cpap 5-10 cm H2O will be ordered for her and a mirage fx nasal mask in standard size will be ordered for her. I advised her that I would send her order to [facility] and they would be calling her to set up her CPAP. Shelly Shields verbalized understanding. Shelly Shields verbalized understanding to use her cpap at least four or more hours per night. A follow up appt was made her insurance purposes on 06/29/15 at 3:30. Shelly Shields knows to bring her cpap.

## 2015-05-04 NOTE — Telephone Encounter (Signed)
Pt returned call

## 2015-06-20 ENCOUNTER — Telehealth: Payer: Self-pay | Admitting: Neurology

## 2015-06-20 NOTE — Telephone Encounter (Signed)
Pt called to r/s appt from 06/29/15 to 07/20/15. I skyped Baxter Hire to ensure pt was in window of time for insurance to pay for 1st CPAP visit.  Pt was made aware of 30-60 day timeframe for 1st CPAP appt. Pt was told to call [facility] to discuss day she started CPAP before r/s appt.

## 2015-06-29 ENCOUNTER — Ambulatory Visit: Payer: Self-pay | Admitting: Neurology

## 2015-07-20 ENCOUNTER — Ambulatory Visit: Payer: Self-pay | Admitting: Neurology

## 2015-07-20 ENCOUNTER — Telehealth: Payer: Self-pay | Admitting: *Deleted

## 2015-07-20 NOTE — Telephone Encounter (Signed)
Called and LVM. Pt r/s to 08/03/15 with Tylene Fantasia, NP d/t Dr Dohmeier being out this afternoon. Advised Dr Dohmeier opening her morning slots tomorrow if she is interested. Gave [facility] phone number for her to call back. I can open a slot if needed.

## 2015-08-03 ENCOUNTER — Encounter: Payer: Self-pay | Admitting: Adult Health

## 2015-08-03 ENCOUNTER — Ambulatory Visit (INDEPENDENT_AMBULATORY_CARE_PROVIDER_SITE_OTHER): Payer: 59 | Admitting: Adult Health

## 2015-08-03 VITALS — BP 148/92 | HR 90 | Resp 14 | Ht 61.0 in | Wt 241.0 lb

## 2015-08-03 DIAGNOSIS — G4733 Obstructive sleep apnea (adult) (pediatric): Secondary | ICD-10-CM

## 2015-08-03 DIAGNOSIS — Z9989 Dependence on other enabling machines and devices: Principal | ICD-10-CM

## 2015-08-03 NOTE — Progress Notes (Signed)
PATIENT: Shelly Shields DOB: 10-31-1980  REASON FOR VISIT: follow up- OSA on CPAP HISTORY FROM: patient  HISTORY OF PRESENT ILLNESS: Shelly Shields is a 35 year old female with a history of obstructive sleep apnea on CPAP. She returns today for a compliance download. Her download indicates that she uses the machine 28 out of 30 days for compliance of 93.3%. On average she uses her machine for 5 hours and 39 minutes each night. Her percentage of days with usage greater than 4 hours is 86.7%. Her residual AHI is 0.2 with a minimum pressure of 5 cm of water and max pressure 10 cm of water. Patient states that she is tolerating the machine well. She has noticed good benefit since she started the machine. She states that she does not feel as sleepy throughout the day. She states that she'll occasionally have some days where she feels more tired but there is definitely an improvement. She denies any new neurological events. She returns today for an evaluation.  HISTORY  02/23/2015 [facility]): Shelly Shields is a 35 y.o. female , seen here as a referral from Dr. Ernie Hew for a sleep consultation,  Shelly Shields reports that she has been told by others ( her mother )that she has sleep apnea. The patient's father had the same problem he suffered a stroke at a young age, 40.She's also concerned about her daytime sleepiness and fatigue. In spite of her young age she has been treated for hypertension and diabetes. She further carries a diagnosis of asthma and migraines without status migrainosus. She is considered at this time morbidly obese due to the elevated body mass index. 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 She aims  for 7 hours of nocturnal sleep usually she goes to bed around 11 PM and she will be very promptly finding herself asleep. She will have one or 2 times at night the urge to urinate. She usually can go back to sleep quickly. She prefers to sleep prone, usually she finds herself in another position when she wakes up, often supine.She has to arise at around 6 AM either for school or for work. 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 Waking up is not easy for her. She has to rely on an alarm she cannot spontaneously wake up at the desired time.She keeps usually is juice or water on her bed stand to treat her dry mouth. She does not drink coffee but some sodas in daytime and iced tea. This is for all 3 meals of the day.She commutes 30 minutes to work, she works at [employer] and has daylight exposure at the drop of window.  REVIEW OF SYSTEMS: Out of a complete 14 system review of symptoms, the patient complains only of the following symptoms, and all other reviewed systems are negative.  ALLERGIES: Allergies  Allergen Reactions  . Ibuprofen   . Ketorolac     HOME MEDICATIONS: Outpatient Prescriptions Prior to Visit  Medication Sig Dispense Refill  .  Blood Glucose Monitoring Suppl (ACCU-CHEK NANO SMARTVIEW) W/DEVICE KIT 1 Device by Does not apply route 4 (four) times daily - after meals and at bedtime. 1 kit 0  . fexofenadine (ALLEGRA) 180 MG tablet Take 180 mg by mouth daily.    . fluticasone (FLONASE) 50 MCG/ACT nasal spray Place 2 sprays into both nostrils daily as needed for allergies.   3  . glucose blood (ACCU-CHEK SMARTVIEW) test strip Check sugar 6 x daily (Patient taking differently: Check sugar 6 x daily. Uses true test instead) 200 each 3  . Insulin Glargine (LANTUS) 100 UNIT/ML Solostar Pen  Inject 35 Units into the skin daily. 15 mL 11  . Insulin Pen Needle (INSUPEN PEN NEEDLES) 32G X 4 MM MISC BD Pen Needles- brand specific. Inject insulin via insulin pen daily 200 each 3  . ketotifen (ZADITOR) 0.025 % ophthalmic solution Place 1 drop into both eyes daily as needed (dry eyes).    Elmore Guise Devices (ACCU-CHEK SOFTCLIX) lancets Use as instructed for blood glucose checks four times daily, before meals and at bedtime 100 each 5  . losartan (COZAAR) 25 MG tablet Take 25 mg by mouth daily.    . medroxyPROGESTERone (DEPO-PROVERA) 150 MG/ML injection Inject 150 mg into the muscle every 3 (three) months.  3  . metFORMIN (GLUCOPHAGE-XR) 500 MG 24 hr tablet TK 2 TS PO DAILY  0  . promethazine (PHENERGAN) 25 MG tablet Take 25 mg by mouth every 6 (six) hours as needed for nausea or vomiting.    . pseudoephedrine (SUDAFED) 120 MG 12 hr tablet Take 120 mg by mouth daily as needed for congestion.    . SUMAtriptan (IMITREX) 100 MG tablet Take 1 tablet (100 mg total) by mouth every 2 (two) hours as needed for migraine or headache. 10 tablet 1  . TRUEPLUS LANCETS 33G MISC USE TO CHECK BLOOD GLUCOSE QID - AFTER MEALS AND AT BEDTIME  3  . VENTOLIN HFA 108 (90 BASE) MCG/ACT inhaler Use as directed 2 puffs in the mouth or throat 4 (four) times daily as needed.  3  . nystatin (MYCOSTATIN) 100000 UNIT/ML suspension Take 5 mLs (500,000 Units total) by mouth 4 (four) times daily. 60 mL 1   No facility-administered medications prior to visit.    PAST MEDICAL HISTORY: Past Medical History  Diagnosis Date  . Hypertension   . Diabetes mellitus without complication ([place])   . Migraines   . Asthma   . Obesity     PAST SURGICAL HISTORY: History reviewed. No pertinent past surgical history.  FAMILY HISTORY: Family History  Problem Relation Age of Onset  . Hypertension Mother   . Thyroid disease Mother   . Hypertension Father   . Stroke Father   . Diabetes Father   . Sleep apnea Father     SOCIAL  HISTORY: Social History   Social History  . Marital Status: Single    Spouse Name: N/A  . Number of Children: N/A  . Years of Education: N/A   Occupational History  . Not on file.   Social History Main Topics  . Smoking status: Never Smoker   . Smokeless tobacco: Not on file  . Alcohol Use: 0.0 oz/week    0 Standard drinks or equivalent per week     Comment: occasional   . Drug Use: No  . Sexual Activity: Not on file   Other Topics Concern  . Not on file   Social History Narrative   Lives in [place]  Hobbies: School and working      PHYSICAL EXAM  Filed Vitals:   08/03/15 1422  BP: 148/92  Pulse: 90  Resp: 14  Height: _0  (1.549 m)  Weight: 241 lb (109.317 kg)  SpO2: 99%   Body mass index is 45.56 kg/(m^2).  Generalized: Well developed, in no acute distress  Neck: circumference 18 inches, Mallampati 4+  Neurological examination  Mentation: Alert oriented to time, place, history taking. Follows all commands speech and language fluent Cranial nerve II-XII: Pupils were equal round reactive to light. Extraocular movements were full, visual field were full on confrontational test. Facial sensation and strength were normal. Uvula tongue midline. Head turning and shoulder shrug  were normal and symmetric. Motor: The motor testing reveals 5 over 5 strength of all 4 extremities. Good symmetric motor tone is noted throughout.  Sensory: Sensory testing is intact to soft touch on all 4 extremities. No evidence of extinction is noted.  Coordination: Cerebellar testing reveals good finger-nose-finger and heel-to-shin bilaterally.  Gait and station: Gait is normal. Tandem gait is normal. Romberg is negative. No drift is seen.  Reflexes: Deep tendon reflexes are symmetric and normal bilaterally.   DIAGNOSTIC DATA (LABS, IMAGING, TESTING) - I reviewed patient records, labs, notes, testing and imaging myself where available.  Lab Results  Component Value Date   WBC  11.5* 11/27/2014   HGB 16.5* 11/27/2014   HCT 51.0* 11/27/2014   MCV 89.8 11/27/2014   PLT 315 11/27/2014      Component Value Date/Time   NA 146* 11/29/2014 0424   K 3.9 11/29/2014 0424   CL 115* 11/29/2014 0424   CO2 23 11/29/2014 0424   GLUCOSE 283* 11/29/2014 0424   BUN 13 11/29/2014 0424   CREATININE 1.27* 11/29/2014 0424   CALCIUM 8.5* 11/29/2014 0424   PROT 7.2 11/28/2014 0732   ALBUMIN 3.8 11/28/2014 0732   AST 49* 11/28/2014 0732   ALT 42 11/28/2014 0732   ALKPHOS 144* 11/28/2014 0732   BILITOT 0.7 11/28/2014 0732   GFRNONAA 54* 11/29/2014 0424   GFRAA >60 11/29/2014 0424   Lab Results  Component Value Date   CHOL 172 11/28/2014   HDL 32* 11/28/2014   LDLCALC 97 11/28/2014   TRIG 215* 11/28/2014   CHOLHDL 5.4 11/28/2014   Lab Results  Component Value Date   HGBA1C 11.6* 11/27/2014    Lab Results  Component Value Date   TSH 1.968 11/28/2014      ASSESSMENT AND PLAN 35 y.o. year old female  has a past medical history of Hypertension; Diabetes mellitus without complication ([place]); Migraines; Asthma; and Obesity. here with:  1. Obstructive sleep apnea on CPAP  Patient is doing well. Her CPAP download shows excellent compliance and treatment of her apnea. She is encouraged to continue using her CPAP nightly and greater than 4 hours each night. She should change on her supplies approximately every 3 months. Patient verbalized understanding. She will follow-up in 6 months with Dr. Mechele Claude, MSN, NP-C 08/03/2015, 2:52 PM [facility] [address], [place], [place] [address] [phone]

## 2015-08-03 NOTE — Patient Instructions (Signed)
Continue using CPAP nightly <4 hours each night Change supplies approximately  every three months.  If your symptoms worsen or you develop new symptoms please let us know.

## 2015-08-08 ENCOUNTER — Encounter: Payer: Self-pay | Admitting: *Deleted

## 2015-08-15 NOTE — Progress Notes (Signed)
I reviewed note and agree with plan.    R. , MD  Certified in Neurology, Neurophysiology and Neuroimaging  [facility] [address], [address] [place], [place] [address] [phone]   

## 2015-12-12 ENCOUNTER — Encounter: Payer: Self-pay | Admitting: Neurology

## 2016-01-12 ENCOUNTER — Emergency Department (HOSPITAL_COMMUNITY): Payer: Self-pay

## 2016-01-12 ENCOUNTER — Emergency Department (HOSPITAL_COMMUNITY)
Admission: EM | Admit: 2016-01-12 | Discharge: 2016-01-13 | Disposition: A | Discharge status: Home / Self Care | Payer: Self-pay | Attending: Emergency Medicine | Admitting: Emergency Medicine

## 2016-01-12 ENCOUNTER — Encounter (HOSPITAL_COMMUNITY): Payer: Self-pay | Admitting: Emergency Medicine

## 2016-01-12 DIAGNOSIS — E860 Dehydration: Secondary | ICD-10-CM | POA: Insufficient documentation

## 2016-01-12 DIAGNOSIS — N390 Urinary tract infection, site not specified: Secondary | ICD-10-CM | POA: Insufficient documentation

## 2016-01-12 DIAGNOSIS — E119 Type 2 diabetes mellitus without complications: Secondary | ICD-10-CM | POA: Insufficient documentation

## 2016-01-12 DIAGNOSIS — Z7984 Long term (current) use of oral hypoglycemic drugs: Secondary | ICD-10-CM | POA: Insufficient documentation

## 2016-01-12 DIAGNOSIS — Z79899 Other long term (current) drug therapy: Secondary | ICD-10-CM | POA: Insufficient documentation

## 2016-01-12 DIAGNOSIS — I10 Essential (primary) hypertension: Secondary | ICD-10-CM | POA: Insufficient documentation

## 2016-01-12 DIAGNOSIS — J45909 Unspecified asthma, uncomplicated: Secondary | ICD-10-CM | POA: Insufficient documentation

## 2016-01-12 DIAGNOSIS — Z794 Long term (current) use of insulin: Secondary | ICD-10-CM | POA: Insufficient documentation

## 2016-01-12 LAB — URINALYSIS, ROUTINE W REFLEX MICROSCOPIC
Glucose, UA: NEGATIVE mg/dL
Hgb urine dipstick: NEGATIVE
Ketones, ur: 40 mg/dL — AB
NITRITE: POSITIVE — AB
Protein, ur: 100 mg/dL — AB
SPECIFIC GRAVITY, URINE: 1.035 — AB (ref 1.005–1.030)
pH: 5 (ref 5.0–8.0)

## 2016-01-12 LAB — COMPREHENSIVE METABOLIC PANEL
ALBUMIN: 4 g/dL (ref 3.5–5.0)
ALK PHOS: 95 U/L (ref 38–126)
ALT: 24 U/L (ref 14–54)
ANION GAP: 8 (ref 5–15)
AST: 22 U/L (ref 15–41)
BILIRUBIN TOTAL: 0.6 mg/dL (ref 0.3–1.2)
BUN: 12 mg/dL (ref 6–20)
CO2: 22 mmol/L (ref 22–32)
Calcium: 9.7 mg/dL (ref 8.9–10.3)
Chloride: 111 mmol/L (ref 101–111)
Creatinine, Ser: 0.91 mg/dL (ref 0.44–1.00)
GFR calc non Af Amer: >60 mL/min (ref >60)
GLUCOSE: 147 mg/dL — AB (ref 65–99)
POTASSIUM: 3.8 mmol/L (ref 3.5–5.1)
Sodium: 141 mmol/L (ref 135–145)
TOTAL PROTEIN: 7.2 g/dL (ref 6.5–8.1)

## 2016-01-12 LAB — POC URINE PREG, ED: PREG TEST UR: NEGATIVE

## 2016-01-12 LAB — URINE MICROSCOPIC-ADD ON

## 2016-01-12 LAB — CBC
HCT: 39.3 % (ref 36.0–46.0)
HEMOGLOBIN: 12.5 g/dL (ref 12.0–15.0)
MCH: 27.4 pg (ref 26.0–34.0)
MCHC: 31.8 g/dL (ref 30.0–36.0)
MCV: 86.2 fL (ref 78.0–100.0)
Platelets: 325 10*3/uL (ref 150–400)
RBC: 4.56 MIL/uL (ref 3.87–5.11)
RDW: 14.4 % (ref 11.5–15.5)
WBC: 12 10*3/uL — ABNORMAL HIGH (ref 4.0–10.5)

## 2016-01-12 LAB — LIPASE, BLOOD: Lipase: 32 U/L (ref 11–51)

## 2016-01-12 MED ORDER — ONDANSETRON HCL 4 MG/2ML IJ SOLN
4.0000 mg | Freq: Once | INTRAMUSCULAR | Status: AC
  Administered 2016-01-13: 4 mg via INTRAVENOUS
  Filled 2016-01-12: qty 2

## 2016-01-12 MED ORDER — SODIUM CHLORIDE 0.9 % IV BOLUS (SEPSIS)
1000.0000 mL | Freq: Once | INTRAVENOUS | Status: AC
Start: 2016-01-12 — End: 2016-01-13
  Administered 2016-01-13: 1000 mL via INTRAVENOUS

## 2016-01-12 MED ORDER — MORPHINE SULFATE (PF) 4 MG/ML IV SOLN
4.0000 mg | Freq: Once | INTRAVENOUS | Status: AC
  Administered 2016-01-13: 4 mg via INTRAVENOUS
  Filled 2016-01-12: qty 1

## 2016-01-12 NOTE — ED Triage Notes (Signed)
Pt. reports left flank pain onset 2 days ago , denies dysuria or hematuria , pt. added LLQ pain today , mild nausea , no emesis or diarrhea .

## 2016-01-12 NOTE — ED Provider Notes (Signed)
[facility] DEPT Provider Note   CSN: [id number] Arrival date & time: 01/12/16  1859  By signing my name below, I, Higinio Plan, attest that this documentation has been prepared under the direction and in the presence of Isla Pence, MD . Electronically Signed: Higinio Plan, Scribe. 01/12/2016. 11:29 PM.  History   Chief Complaint Chief Complaint  Patient presents with  . Flank Pain  . Abdominal Pain   The history is provided by the patient. No language interpreter was used.   HPI Comments: Shelly Shields is a 35 y.o. female with PMHx of DM, HTN and DKA, who presents to the Emergency Department complaining of gradually worsening, right flank pain that began 2 days ago and worsened today. Pt reports her flank pain has recently begun radiating towards her abdomen. She states her pain is exacerbated when lying down but is temporary relieved when sitting up. She reports associated nausea, headache, urinary frequency and "pressure" while urinating; though she denies dysuria. She notes hx of UTI but states that her current symptoms feel worse than symptoms in the past. She notes she has taken two doses of pyridium with no relief. Pt states she does not have regular menstrual periods as she receives a shot of birth control every 3 months. She denies fever and chills.  Past Medical History:  Diagnosis Date  . Asthma   . Diabetes mellitus without complication ([place])   . Hypertension   . Migraines   . Obesity     Patient Active Problem List   Diagnosis Date Noted  . Hypernatremia   . Asthma, chronic   . DKA (diabetic ketoacidoses) ([place]) 11/27/2014  . Diabetes mellitus, new onset ([place])   . 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 Essential hypertension   . Asthma     History reviewed. No pertinent surgical history.  OB History    No data available       Home Medications    Prior to Admission medications   Medication Sig Start Date End Date Taking? Authorizing  Provider  Blood Glucose Monitoring Suppl (ACCU-CHEK NANO SMARTVIEW) W/DEVICE KIT 1 Device by Does not apply route 4 (four) times daily - after meals and at bedtime. 11/29/14   Frazier Richards, MD  escitalopram (LEXAPRO) 10 MG tablet Take 10 mg by mouth daily.    Historical Provider, MD  fexofenadine (ALLEGRA) 180 MG tablet Take 180 mg by mouth daily.    Historical Provider, MD  fluticasone (FLONASE) 50 MCG/ACT nasal spray Place 2 sprays into both nostrils daily as needed for allergies.  09/22/14   Historical Provider, MD  glucose blood (ACCU-CHEK SMARTVIEW) test strip Check sugar 6 x daily Patient taking differently: Check sugar 6 x daily. Uses true test instead 11/29/14   Frazier Richards, MD  HYDROcodone-acetaminophen (NORCO/VICODIN) 5-325 MG tablet Take 1 tablet by mouth every 4 (four) hours as needed. 01/13/16   Isla Pence, MD  Insulin Glargine (LANTUS) 100 UNIT/ML Solostar Pen Inject 35 Units into the skin daily. 11/29/14   Frazier Richards, MD  Insulin Pen Needle (INSUPEN PEN NEEDLES) 32G X 4 MM MISC BD Pen Needles- brand specific. Inject insulin via insulin pen daily 11/29/14   Frazier Richards, MD  ketotifen (ZADITOR) 0.025 % ophthalmic solution Place 1 drop into both eyes daily as needed (dry eyes).    Historical Provider, MD  Lancet Devices [facility]) lancets Use as instructed for blood glucose checks four times daily, before meals and at bedtime 11/29/14  Frazier Richards, MD  losartan (COZAAR) 25 MG tablet Take 25 mg by mouth daily.    Historical Provider, MD  medroxyPROGESTERone (DEPO-PROVERA) 150 MG/ML injection Inject 150 mg into the muscle every 3 (three) months. 09/26/14   Historical Provider, MD  metFORMIN (GLUCOPHAGE-XR) 500 MG 24 hr tablet TK 2 TS PO DAILY 12/20/14   Historical Provider, MD  ondansetron (ZOFRAN) 4 MG tablet Take 1 tablet (4 mg total) by mouth every 6 (six) hours. 01/13/16   Isla Pence, MD  promethazine (PHENERGAN) 25 MG tablet Take 25 mg by mouth every 6 (six) hours as  needed for nausea or vomiting.    Historical Provider, MD  pseudoephedrine (SUDAFED) 120 MG 12 hr tablet Take 120 mg by mouth daily as needed for congestion.    Historical Provider, MD  sulfamethoxazole-trimethoprim (BACTRIM DS,SEPTRA DS) 800-160 MG tablet Take 1 tablet by mouth 2 (two) times daily. 01/13/16 01/20/16  Isla Pence, MD  SUMAtriptan (IMITREX) 100 MG tablet Take 1 tablet (100 mg total) by mouth every 2 (two) hours as needed for migraine or headache. 11/29/14   Frazier Richards, MD  TRUEPLUS LANCETS 33G MISC USE TO CHECK BLOOD GLUCOSE QID - AFTER MEALS AND AT BEDTIME 12/22/14   Historical Provider, MD  VENTOLIN HFA 108 (90 BASE) MCG/ACT inhaler Use as directed 2 puffs in the mouth or throat 4 (four) times daily as needed. 12/21/14   Historical Provider, MD    Family History Family History  Problem Relation Age of Onset  . Hypertension Mother   . Thyroid disease Mother   . Hypertension Father   . Stroke Father   . Diabetes Father   . Sleep apnea Father     Social History Social History  Substance Use Topics  . Smoking status: Never Smoker  . Smokeless tobacco: Not on file  . Alcohol use 0.0 oz/week     Comment: occasional      Allergies   Ibuprofen and Ketorolac  Review of Systems Review of Systems  Constitutional: Negative for chills and fever.  Gastrointestinal: Positive for nausea.  Genitourinary: Positive for flank pain and frequency. Negative for dysuria.  Neurological: Positive for headaches.   Physical Exam Updated Vital Signs BP 127/69 (BP Location: Right Arm)   Pulse 95   Temp 98.3 F (36.8 C) (Oral)   Resp 20   Ht 5' 1" (1.549 m)   Wt 244 lb (110.7 kg)   SpO2 99%   BMI 46.10 kg/m   Physical Exam  Constitutional: She is oriented to person, place, and time. She appears well-developed and well-nourished.  HENT:  Head: Normocephalic and atraumatic.  Eyes: Conjunctivae are normal. Pupils are equal, round, and reactive to light. Right eye exhibits no  discharge. Left eye exhibits no discharge. No scleral icterus.  Neck: Normal range of motion. No JVD present. No tracheal deviation present.  Pulmonary/Chest: Effort normal. No stridor.  Musculoskeletal: She exhibits tenderness.  Left sided lumbar tenderness  Neurological: She is alert and oriented to person, place, and time. Coordination normal.  Psychiatric: She has a normal mood and affect. Her behavior is normal. Judgment and thought content normal.  Nursing note and vitals reviewed.  ED Treatments / Results  Labs (all labs ordered are listed, but only abnormal results are displayed) Labs Reviewed  COMPREHENSIVE METABOLIC PANEL - Abnormal; Notable for the following:       Result Value   Glucose, Bld 147 (*)    All other components within normal limits  CBC -  Abnormal; Notable for the following:    WBC 12.0 (*)    All other components within normal limits  URINALYSIS, ROUTINE W REFLEX MICROSCOPIC (NOT AT [facility]) - Abnormal; Notable for the following:    Color, Urine RED (*)    Specific Gravity, Urine 1.035 (*)    Bilirubin Urine SMALL (*)    Ketones, ur 40 (*)    Protein, ur 100 (*)    Nitrite POSITIVE (*)    Leukocytes, UA MODERATE (*)    All other components within normal limits  URINE MICROSCOPIC-ADD ON - Abnormal; Notable for the following:    Squamous Epithelial / LPF 0-5 (*)    Bacteria, UA FEW (*)    Crystals CA OXALATE CRYSTALS (*)    All other components within normal limits  LIPASE, BLOOD  POC URINE PREG, ED    EKG  EKG Interpretation None       Radiology Ct Renal Stone Study  Result Date: 01/12/2016 CLINICAL DATA:  Back pain on the left side that radiates to the front for 1 week, worsening last night. EXAM: CT ABDOMEN AND PELVIS WITHOUT CONTRAST TECHNIQUE: Multidetector CT imaging of the abdomen and pelvis was performed following the standard protocol without IV contrast. COMPARISON:  None. FINDINGS: Lower chest and abdominal wall:  No contributory  findings. Hepatobiliary: No focal liver abnormality.No evidence of biliary obstruction or stone. Pancreas: Unremarkable. Spleen: Unremarkable. Adrenals/Urinary Tract: Negative adrenals. No hydronephrosis or stone. Unremarkable bladder. Stomach/Bowel:  No obstruction. No appendicitis. Reproductive:Negative. Vascular/Lymphatic: Negative.  No mass or adenopathy. Other: No ascites or pneumoperitoneum. Musculoskeletal: No acute or aggressive finding. IMPRESSION: Negative.  No explanation for abdominal pain. Electronically Signed   By: Monte Fantasia M.D.   On: 01/12/2016 23:59   Procedures Procedures  DIAGNOSTIC STUDIES:  Oxygen Saturation is 99% on RA, normal by my interpretation.    COORDINATION OF CARE:  11:29 PM Discussed treatment plan, which includes urinalysis with pt at bedside and pt agreed to plan.  Medications Ordered in ED Medications  sodium chloride 0.9 % bolus 1,000 mL (1,000 mLs Intravenous New Bag/Given 01/13/16 0011)  morphine 4 MG/ML injection 4 mg (4 mg Intravenous Given 01/13/16 0011)  ondansetron (ZOFRAN) injection 4 mg (4 mg Intravenous Given 01/13/16 0011)  sulfamethoxazole-trimethoprim (BACTRIM DS,SEPTRA DS) 800-160 MG per tablet 1 tablet (1 tablet Oral Given 01/13/16 0017)    Initial Impression / Assessment and Plan / ED Course  I have reviewed the triage vital signs and the nursing notes.  Pertinent labs & imaging results that were available during my care of the patient were reviewed by me and considered in my medical decision making (see chart for details).  Clinical Course   CT ok, so no stone with UTI, just UTI.  Pt is feeling better. Pt to return if worse.  I personally performed the services described in this documentation, which was scribed in my presence. The recorded information has been reviewed and is accurate.   Final Clinical Impressions(s) / ED Diagnoses   Final diagnoses:  UTI (lower urinary tract infection)  Dehydration    New  Prescriptions New Prescriptions   HYDROCODONE-ACETAMINOPHEN (NORCO/VICODIN) 5-325 MG TABLET    Take 1 tablet by mouth every 4 (four) hours as needed.   ONDANSETRON (ZOFRAN) 4 MG TABLET    Take 1 tablet (4 mg total) by mouth every 6 (six) hours.   SULFAMETHOXAZOLE-TRIMETHOPRIM (BACTRIM DS,SEPTRA DS) 800-160 MG TABLET    Take 1 tablet by mouth 2 (two) times daily.  I personally performed the services described in this documentation, which was scribed in my presence. The recorded information has been reviewed and is accurate.   Isla Pence, MD 01/13/16 [phone]

## 2016-01-13 MED ORDER — SULFAMETHOXAZOLE-TRIMETHOPRIM 800-160 MG PO TABS
1.0000 | ORAL_TABLET | Freq: Two times a day (BID) | ORAL | 0 refills | Status: AC
Start: 2016-01-13 — End: 2016-01-20

## 2016-01-13 MED ORDER — SULFAMETHOXAZOLE-TRIMETHOPRIM 800-160 MG PO TABS
1.0000 | ORAL_TABLET | Freq: Once | ORAL | Status: AC
  Administered 2016-01-13: 1 via ORAL
  Filled 2016-01-13: qty 1

## 2016-01-13 MED ORDER — HYDROCODONE-ACETAMINOPHEN 5-325 MG PO TABS: 1.0000 | ORAL_TABLET | ORAL | 0 refills | Status: DC | PRN

## 2016-01-13 MED ORDER — ONDANSETRON HCL 4 MG PO TABS: 4.0000 mg | ORAL_TABLET | Freq: Four times a day (QID) | ORAL | 0 refills | Status: DC

## 2016-01-13 NOTE — ED Notes (Signed)
Pt d/c home via w/c.

## 2016-01-28 ENCOUNTER — Encounter (HOSPITAL_COMMUNITY): Payer: Self-pay | Admitting: Emergency Medicine

## 2016-01-28 ENCOUNTER — Emergency Department (HOSPITAL_COMMUNITY)
Admission: EM | Admit: 2016-01-28 | Discharge: 2016-01-29 | Disposition: A | Discharge status: Home / Self Care | Payer: 59 | Attending: Emergency Medicine | Admitting: Emergency Medicine

## 2016-01-28 DIAGNOSIS — Z7984 Long term (current) use of oral hypoglycemic drugs: Secondary | ICD-10-CM | POA: Insufficient documentation

## 2016-01-28 DIAGNOSIS — N390 Urinary tract infection, site not specified: Secondary | ICD-10-CM | POA: Insufficient documentation

## 2016-01-28 DIAGNOSIS — I10 Essential (primary) hypertension: Secondary | ICD-10-CM | POA: Insufficient documentation

## 2016-01-28 DIAGNOSIS — J45909 Unspecified asthma, uncomplicated: Secondary | ICD-10-CM | POA: Insufficient documentation

## 2016-01-28 DIAGNOSIS — E119 Type 2 diabetes mellitus without complications: Secondary | ICD-10-CM | POA: Diagnosis not present

## 2016-01-28 DIAGNOSIS — Z794 Long term (current) use of insulin: Secondary | ICD-10-CM | POA: Diagnosis not present

## 2016-01-28 DIAGNOSIS — Z79899 Other long term (current) drug therapy: Secondary | ICD-10-CM | POA: Diagnosis not present

## 2016-01-28 DIAGNOSIS — R109 Unspecified abdominal pain: Secondary | ICD-10-CM | POA: Diagnosis present

## 2016-01-28 LAB — COMPREHENSIVE METABOLIC PANEL
ALK PHOS: 95 U/L (ref 38–126)
ALT: 24 U/L (ref 14–54)
ANION GAP: 6 (ref 5–15)
AST: 21 U/L (ref 15–41)
Albumin: 3.6 g/dL (ref 3.5–5.0)
BILIRUBIN TOTAL: 0.5 mg/dL (ref 0.3–1.2)
BUN: 12 mg/dL (ref 6–20)
CALCIUM: 9 mg/dL (ref 8.9–10.3)
CO2: 22 mmol/L (ref 22–32)
Chloride: 109 mmol/L (ref 101–111)
Creatinine, Ser: 0.9 mg/dL (ref 0.44–1.00)
Glucose, Bld: 260 mg/dL — ABNORMAL HIGH (ref 65–99)
Potassium: 3.8 mmol/L (ref 3.5–5.1)
SODIUM: 137 mmol/L (ref 135–145)
TOTAL PROTEIN: 6.4 g/dL — AB (ref 6.5–8.1)

## 2016-01-28 LAB — URINALYSIS, ROUTINE W REFLEX MICROSCOPIC
Bilirubin Urine: NEGATIVE
Glucose, UA: 250 mg/dL — AB
Ketones, ur: NEGATIVE mg/dL
NITRITE: POSITIVE — AB
PROTEIN: NEGATIVE mg/dL
SPECIFIC GRAVITY, URINE: 1.017 (ref 1.005–1.030)
pH: 5.5 (ref 5.0–8.0)

## 2016-01-28 LAB — CBC
HCT: 37.5 % (ref 36.0–46.0)
HEMOGLOBIN: 12 g/dL (ref 12.0–15.0)
MCH: 27.6 pg (ref 26.0–34.0)
MCHC: 32 g/dL (ref 30.0–36.0)
MCV: 86.2 fL (ref 78.0–100.0)
Platelets: 310 10*3/uL (ref 150–400)
RBC: 4.35 MIL/uL (ref 3.87–5.11)
RDW: 14.1 % (ref 11.5–15.5)
WBC: 10 10*3/uL (ref 4.0–10.5)

## 2016-01-28 LAB — URINE MICROSCOPIC-ADD ON

## 2016-01-28 LAB — LIPASE, BLOOD: Lipase: 37 U/L (ref 11–51)

## 2016-01-28 LAB — POC URINE PREG, ED: PREG TEST UR: NEGATIVE

## 2016-01-28 MED ORDER — SULFAMETHOXAZOLE-TRIMETHOPRIM 800-160 MG PO TABS
1.0000 | ORAL_TABLET | Freq: Once | ORAL | Status: AC
  Administered 2016-01-29: 1 via ORAL
  Filled 2016-01-28: qty 1

## 2016-01-28 MED ORDER — HYDROCODONE-ACETAMINOPHEN 5-325 MG PO TABS
1.0000 | ORAL_TABLET | Freq: Once | ORAL | Status: AC
  Administered 2016-01-29: 1 via ORAL
  Filled 2016-01-28: qty 1

## 2016-01-28 MED ORDER — ONDANSETRON 4 MG PO TBDP
4.0000 mg | ORAL_TABLET | Freq: Once | ORAL | Status: AC
  Administered 2016-01-29: 4 mg via ORAL
  Filled 2016-01-28: qty 1

## 2016-01-28 NOTE — ED Triage Notes (Signed)
C/o L flank pain that radiates to L abd since Tuesday with urinary frequency.  States she was seen in ED 2 weeks ago for same symptoms and completed antibiotics for UTI.  Reports that symptoms initially went away and then returned.

## 2016-01-28 NOTE — ED Notes (Signed)
QNS,  Pt will try to void later

## 2016-01-29 MED ORDER — SULFAMETHOXAZOLE-TRIMETHOPRIM 800-160 MG PO TABS: 1.0000 | ORAL_TABLET | Freq: Two times a day (BID) | ORAL | 0 refills | Status: AC

## 2016-01-29 MED ORDER — FLUCONAZOLE 150 MG PO TABS: 150.0000 mg | ORAL_TABLET | Freq: Every day | ORAL | 0 refills | Status: AC

## 2016-01-29 MED ORDER — ONDANSETRON 4 MG PO TBDP: 4.0000 mg | ORAL_TABLET | Freq: Three times a day (TID) | ORAL | 0 refills | Status: DC | PRN

## 2016-01-29 MED ORDER — HYDROCODONE-ACETAMINOPHEN 5-325 MG PO TABS: 1.0000 | ORAL_TABLET | ORAL | 0 refills | Status: DC | PRN

## 2016-01-29 NOTE — ED Notes (Signed)
Pt verbalized understanding of discharge instructions and follow-up care. No further questions at this time. Pt ambulatory at time of discharge.

## 2016-01-30 LAB — URINE CULTURE

## 2016-02-01 ENCOUNTER — Ambulatory Visit: Payer: 59 | Admitting: Neurology

## 2016-02-02 NOTE — ED Provider Notes (Signed)
[facility] DEPT Provider Note   CSN: [id number] Arrival date & time: 01/28/16  1950     History   Chief Complaint Chief Complaint  Patient presents with  . Flank Pain  . Abdominal Pain    HPI Shelly Shields is a 35 y.o. female.  Patient complains of left flank pain that radiates to LLQ abdomen. She denies fever. She has had nausea with limited vomiting. She was diagnosed with UTI about 2 weeks ago when seen in the ED and reports current symptoms are similar. She felt better while taking the prescribed antibiotics and symptoms returned shortly after completing the regimen. She also reports urinary frequency and mild dysuria.   The history is provided by the patient. No language interpreter was used.  Flank Pain  This is a recurrent problem. Associated symptoms include abdominal pain.  Abdominal Pain   Associated symptoms include nausea. Pertinent negatives include fever and myalgias.    Past Medical History:  Diagnosis Date  . Asthma   . Diabetes mellitus without complication ([place])   . Hypertension   . Migraines   . Obesity     Patient Active Problem List   Diagnosis Date Noted  . Hypernatremia   . Asthma, chronic   . DKA (diabetic ketoacidoses) ([place]) 11/27/2014  . Diabetes mellitus, new onset ([place])   . Hyperglycemic hyperosmolar nonketotic coma ([place])   . Essential hypertension   . Asthma     History reviewed. No pertinent surgical history.  OB History    No data available       Home Medications    Prior to Admission medications   Medication Sig Start Date End Date Taking? Authorizing Provider  Blood Glucose Monitoring Suppl (ACCU-CHEK NANO SMARTVIEW) W/DEVICE KIT 1 Device by Does not apply route 4 (four) times daily - after meals and at bedtime. 11/29/14   Frazier Richards, MD  escitalopram (LEXAPRO) 10 MG tablet Take 10 mg by mouth daily.    Historical Provider, MD  fexofenadine (ALLEGRA) 180 MG tablet Take 180 mg by mouth daily.    Historical Provider,  MD  fluticasone (FLONASE) 50 MCG/ACT nasal spray Place 2 sprays into both nostrils daily as needed for allergies.  09/22/14   Historical Provider, MD  glucose blood (ACCU-CHEK SMARTVIEW) test strip Check sugar 6 x daily Patient taking differently: Check sugar 6 x daily. Uses true test instead 11/29/14   Frazier Richards, MD  HYDROcodone-acetaminophen (NORCO/VICODIN) 5-325 MG tablet Take 1 tablet by mouth every 4 (four) hours as needed. 01/28/16   Charlann Lange, PA-C  Insulin Glargine (LANTUS) 100 UNIT/ML Solostar Pen Inject 35 Units into the skin daily. 11/29/14   Frazier Richards, MD  Insulin Pen Needle (INSUPEN PEN NEEDLES) 32G X 4 MM MISC BD Pen Needles- brand specific. Inject insulin via insulin pen daily 11/29/14   Frazier Richards, MD  ketotifen (ZADITOR) 0.025 % ophthalmic solution Place 1 drop into both eyes daily as needed (dry eyes).    Historical Provider, MD  Lancet Devices [facility]) lancets Use as instructed for blood glucose checks four times daily, before meals and at bedtime 11/29/14   Frazier Richards, MD  losartan (COZAAR) 25 MG tablet Take 25 mg by mouth daily.    Historical Provider, MD  medroxyPROGESTERone (DEPO-PROVERA) 150 MG/ML injection Inject 150 mg into the muscle every 3 (three) months. 09/26/14   Historical Provider, MD  metFORMIN (GLUCOPHAGE-XR) 500 MG 24 hr tablet TK 2 TS PO DAILY 12/20/14   Historical  Provider, MD  ondansetron (ZOFRAN ODT) 4 MG disintegrating tablet Take 1 tablet (4 mg total) by mouth every 8 (eight) hours as needed for nausea or vomiting. 01/29/16   Charlann Lange, PA-C  ondansetron (ZOFRAN) 4 MG tablet Take 1 tablet (4 mg total) by mouth every 6 (six) hours. 01/13/16   Isla Pence, MD  promethazine (PHENERGAN) 25 MG tablet Take 25 mg by mouth every 6 (six) hours as needed for nausea or vomiting.    Historical Provider, MD  pseudoephedrine (SUDAFED) 120 MG 12 hr tablet Take 120 mg by mouth daily as needed for congestion.    Historical Provider, MD    sulfamethoxazole-trimethoprim (BACTRIM DS,SEPTRA DS) 800-160 MG tablet Take 1 tablet by mouth 2 (two) times daily. 01/29/16 02/12/16  Charlann Lange, PA-C  SUMAtriptan (IMITREX) 100 MG tablet Take 1 tablet (100 mg total) by mouth every 2 (two) hours as needed for migraine or headache. 11/29/14   Frazier Richards, MD  TRUEPLUS LANCETS 33G MISC USE TO CHECK BLOOD GLUCOSE QID - AFTER MEALS AND AT BEDTIME 12/22/14   Historical Provider, MD  VENTOLIN HFA 108 (90 BASE) MCG/ACT inhaler Use as directed 2 puffs in the mouth or throat 4 (four) times daily as needed. 12/21/14   Historical Provider, MD    Family History Family History  Problem Relation Age of Onset  . Hypertension Mother   . Thyroid disease Mother   . Hypertension Father   . Stroke Father   . Diabetes Father   . Sleep apnea Father     Social History Social History  Substance Use Topics  . Smoking status: Never Smoker  . Smokeless tobacco: Never Used  . Alcohol use 0.0 oz/week     Comment: occasional      Allergies   Ibuprofen and Ketorolac   Review of Systems Review of Systems  Constitutional: Negative for fever.  Respiratory: Negative.   Cardiovascular: Negative.   Gastrointestinal: Positive for abdominal pain and nausea.  Genitourinary: Positive for flank pain.  Musculoskeletal: Negative for myalgias.  Skin: Negative.   Neurological: Negative for weakness and light-headedness.     Physical Exam Updated Vital Signs BP 141/88   Pulse 77   Temp 98 F (36.7 C) (Oral)   Resp 16   Ht '5\' 1"'$  (1.549 m)   Wt 109.8 kg   SpO2 100%   BMI 45.73 kg/m   Physical Exam  Constitutional: She appears well-developed and well-nourished.  HENT:  Head: Normocephalic.  Neck: Normal range of motion. Neck supple.  Cardiovascular: Normal rate and regular rhythm.   Pulmonary/Chest: Effort normal and breath sounds normal.  Abdominal: Soft. Bowel sounds are normal. There is tenderness (Mild LLQ and suprapubic tenderness.). There is no  rebound and no guarding.  Genitourinary:  Genitourinary Comments: Mild left CVA tenderness.   Musculoskeletal: Normal range of motion.  Neurological: She is alert. No cranial nerve deficit.  Skin: Skin is warm and dry. No rash noted.  Psychiatric: She has a normal mood and affect.     ED Treatments / Results  Labs (all labs ordered are listed, but only abnormal results are displayed) Labs Reviewed  URINE CULTURE - Abnormal; Notable for the following:       Result Value   Culture MULTIPLE SPECIES PRESENT, SUGGEST RECOLLECTION (*)    All other components within normal limits  COMPREHENSIVE METABOLIC PANEL - Abnormal; Notable for the following:    Glucose, Bld 260 (*)    Total Protein 6.4 (*)  All other components within normal limits  URINALYSIS, ROUTINE W REFLEX MICROSCOPIC (NOT AT [facility]) - Abnormal; Notable for the following:    Color, Urine AMBER (*)    APPearance CLOUDY (*)    Glucose, UA 250 (*)    Hgb urine dipstick TRACE (*)    Nitrite POSITIVE (*)    Leukocytes, UA LARGE (*)    All other components within normal limits  URINE MICROSCOPIC-ADD ON - Abnormal; Notable for the following:    Squamous Epithelial / LPF 0-5 (*)    Bacteria, UA RARE (*)    All other components within normal limits  LIPASE, BLOOD  CBC  POC URINE PREG, ED    EKG  EKG Interpretation None       Radiology No results found.  Procedures Procedures (including critical care time)  Medications Ordered in ED Medications  HYDROcodone-acetaminophen (NORCO/VICODIN) 5-325 MG per tablet 1 tablet (1 tablet Oral Given 01/29/16 0002)  sulfamethoxazole-trimethoprim (BACTRIM DS,SEPTRA DS) 800-160 MG per tablet 1 tablet (1 tablet Oral Given 01/29/16 0002)  ondansetron (ZOFRAN-ODT) disintegrating tablet 4 mg (4 mg Oral Given 01/29/16 0002)     Initial Impression / Assessment and Plan / ED Course  I have reviewed the triage vital signs and the nursing notes.  Pertinent labs & imaging results that were  available during my care of the patient were reviewed by me and considered in my medical decision making (see chart for details).  Clinical Course   1. UTI  Recurrent symptoms of UTI with flank pain. ?pyelonephritis vs UTI with bladder symptoms. Will place on Septra DS x 14 days and encourage PCP follow up for recheck.   Final Clinical Impressions(s) / ED Diagnoses   Final diagnoses:  UTI (lower urinary tract infection)    New Prescriptions Discharge Medication List as of 01/29/2016 12:04 AM    START taking these medications   Details  fluconazole (DIFLUCAN) 150 MG tablet Take 1 tablet (150 mg total) by mouth daily., Starting Mon 01/29/2016, Until Tue 01/30/2016, Print    ondansetron (ZOFRAN ODT) 4 MG disintegrating tablet Take 1 tablet (4 mg total) by mouth every 8 (eight) hours as needed for nausea or vomiting., Starting Mon 01/29/2016, Print    sulfamethoxazole-trimethoprim (BACTRIM DS,SEPTRA DS) 800-160 MG tablet Take 1 tablet by mouth 2 (two) times daily., Starting Mon 01/29/2016, Until Mon 02/12/2016, Print         Charlann Lange, PA-C 02/02/16 [id number]    Everlene Balls, MD 02/10/16 [id number]

## 2016-06-15 ENCOUNTER — Encounter (HOSPITAL_COMMUNITY): Payer: Self-pay | Admitting: Emergency Medicine

## 2016-06-15 ENCOUNTER — Emergency Department (HOSPITAL_COMMUNITY)
Admission: EM | Admit: 2016-06-15 | Discharge: 2016-06-16 | Disposition: A | Discharge status: Home / Self Care | Payer: 59 | Attending: Emergency Medicine | Admitting: Emergency Medicine

## 2016-06-15 DIAGNOSIS — E119 Type 2 diabetes mellitus without complications: Secondary | ICD-10-CM | POA: Insufficient documentation

## 2016-06-15 DIAGNOSIS — B373 Candidiasis of vulva and vagina: Secondary | ICD-10-CM | POA: Diagnosis not present

## 2016-06-15 DIAGNOSIS — B379 Candidiasis, unspecified: Secondary | ICD-10-CM | POA: Insufficient documentation

## 2016-06-15 DIAGNOSIS — J45909 Unspecified asthma, uncomplicated: Secondary | ICD-10-CM | POA: Insufficient documentation

## 2016-06-15 DIAGNOSIS — Z794 Long term (current) use of insulin: Secondary | ICD-10-CM | POA: Insufficient documentation

## 2016-06-15 DIAGNOSIS — Z79899 Other long term (current) drug therapy: Secondary | ICD-10-CM | POA: Diagnosis not present

## 2016-06-15 DIAGNOSIS — I10 Essential (primary) hypertension: Secondary | ICD-10-CM | POA: Insufficient documentation

## 2016-06-15 DIAGNOSIS — B3731 Acute candidiasis of vulva and vagina: Secondary | ICD-10-CM

## 2016-06-15 DIAGNOSIS — B370 Candidal stomatitis: Secondary | ICD-10-CM

## 2016-06-15 LAB — URINALYSIS, ROUTINE W REFLEX MICROSCOPIC
BACTERIA UA: NONE SEEN
Bilirubin Urine: NEGATIVE
Hgb urine dipstick: NEGATIVE
Ketones, ur: NEGATIVE mg/dL
Leukocytes, UA: NEGATIVE
Nitrite: NEGATIVE
PROTEIN: NEGATIVE mg/dL
Specific Gravity, Urine: 1.036 — ABNORMAL HIGH (ref 1.005–1.030)
pH: 5 (ref 5.0–8.0)

## 2016-06-15 LAB — CBC WITH DIFFERENTIAL/PLATELET
Basophils Absolute: 0 10*3/uL (ref 0.0–0.1)
Basophils Relative: 0 %
EOS PCT: 0 %
Eosinophils Absolute: 0 10*3/uL (ref 0.0–0.7)
HCT: 44.7 % (ref 36.0–46.0)
Hemoglobin: 14.9 g/dL (ref 12.0–15.0)
LYMPHS ABS: 4.5 10*3/uL — AB (ref 0.7–4.0)
LYMPHS PCT: 35 %
MCH: 27.4 pg (ref 26.0–34.0)
MCHC: 33.3 g/dL (ref 30.0–36.0)
MCV: 82.3 fL (ref 78.0–100.0)
MONO ABS: 0.6 10*3/uL (ref 0.1–1.0)
MONOS PCT: 5 %
Neutro Abs: 7.7 10*3/uL (ref 1.7–7.7)
Neutrophils Relative %: 60 %
PLATELETS: 323 10*3/uL (ref 150–400)
RBC: 5.43 MIL/uL — ABNORMAL HIGH (ref 3.87–5.11)
RDW: 15.2 % (ref 11.5–15.5)
WBC: 12.9 10*3/uL — ABNORMAL HIGH (ref 4.0–10.5)

## 2016-06-15 LAB — BASIC METABOLIC PANEL
Anion gap: 15 (ref 5–15)
BUN: 9 mg/dL (ref 6–20)
CALCIUM: 10.1 mg/dL (ref 8.9–10.3)
CO2: 24 mmol/L (ref 22–32)
Chloride: 99 mmol/L — ABNORMAL LOW (ref 101–111)
Creatinine, Ser: 1.12 mg/dL — ABNORMAL HIGH (ref 0.44–1.00)
GFR calc Af Amer: >60 mL/min (ref >60)
GLUCOSE: 440 mg/dL — AB (ref 65–99)
POTASSIUM: 3.5 mmol/L (ref 3.5–5.1)
Sodium: 138 mmol/L (ref 135–145)

## 2016-06-15 NOTE — ED Triage Notes (Addendum)
Patient reports oral thrush with dry/crusty tongue and vaginal itching with mild discharge  , denies dysuria .

## 2016-06-16 LAB — WET PREP, GENITAL
Clue Cells Wet Prep HPF POC: NONE SEEN
Sperm: NONE SEEN
TRICH WET PREP: NONE SEEN
WBC, Wet Prep HPF POC: NONE SEEN
YEAST WET PREP: NONE SEEN

## 2016-06-16 LAB — HIV ANTIBODY (ROUTINE TESTING W REFLEX): HIV SCREEN 4TH GENERATION: NONREACTIVE

## 2016-06-16 MED ORDER — CLOTRIMAZOLE 1 % EX CREA: TOPICAL_CREAM | CUTANEOUS | 0 refills | Status: DC

## 2016-06-16 MED ORDER — FLUCONAZOLE 100 MG PO TABS: 100.0000 mg | ORAL_TABLET | Freq: Every day | ORAL | 0 refills | Status: DC

## 2016-06-16 NOTE — ED Provider Notes (Signed)
[facility] DEPT Provider Note   CSN: [id number] Arrival date & time: 06/15/16  1941   By signing my name below, I, Eunice Blase, attest that this documentation has been prepared under the direction and in the presence of Everlene Balls, MD. Electronically signed, Eunice Blase, ED Scribe. 06/16/16. 12:34 AM.   History   Chief Complaint Chief Complaint  Patient presents with  . Thrush  . Vaginal Itching    " Yeast Infection"    The history is provided by the patient and medical records. No language interpreter was used.    HPI Comments: Shelly Shields is a 36 y.o. female with Hx of DM who presents to the Emergency Department complaining of oral thrush and vaginal itch. She reports associated fatigue, nausea, "tender" abdominal pain and increased urination. She notes increased yeast infections recently, and she expresses concern because she has had oral thrush 3 times in the last 2-3 years. She states she was prescribed Nystatin antifungal 1 week ago without relief to yeast infection symptoms. Further reports she has not had a period in years secondary to birth control use. Pt denies vaginal discharge. No PCP.  Past Medical History:  Diagnosis Date  . Asthma   . Diabetes mellitus without complication ([place])   . Hypertension   . Migraines   . Obesity     Patient Active Problem List   Diagnosis Date Noted  . Hypernatremia   . Asthma, chronic   . DKA (diabetic ketoacidoses) ([place]) 11/27/2014  . Diabetes mellitus, new onset ([place])   . Hyperglycemic hyperosmolar nonketotic coma ([place])   . Essential hypertension   . Asthma     History reviewed. No pertinent surgical history.  OB History    No data available       Home Medications    Prior to Admission medications   Medication Sig Start Date End Date Taking? Authorizing Provider  Blood Glucose Monitoring Suppl (ACCU-CHEK NANO SMARTVIEW) W/DEVICE KIT 1 Device by Does not apply route 4 (four) times daily - after meals and  at bedtime. 11/29/14   Frazier Richards, MD  escitalopram (LEXAPRO) 10 MG tablet Take 10 mg by mouth daily.    Historical Provider, MD  fexofenadine (ALLEGRA) 180 MG tablet Take 180 mg by mouth daily.    Historical Provider, MD  fluticasone (FLONASE) 50 MCG/ACT nasal spray Place 2 sprays into both nostrils daily as needed for allergies.  09/22/14   Historical Provider, MD  glucose blood (ACCU-CHEK SMARTVIEW) test strip Check sugar 6 x daily Patient taking differently: Check sugar 6 x daily. Uses true test instead 11/29/14   Frazier Richards, MD  HYDROcodone-acetaminophen (NORCO/VICODIN) 5-325 MG tablet Take 1 tablet by mouth every 4 (four) hours as needed. 01/28/16   Charlann Lange, PA-C  Insulin Glargine (LANTUS) 100 UNIT/ML Solostar Pen Inject 35 Units into the skin daily. 11/29/14   Frazier Richards, MD  Insulin Pen Needle (INSUPEN PEN NEEDLES) 32G X 4 MM MISC BD Pen Needles- brand specific. Inject insulin via insulin pen daily 11/29/14   Frazier Richards, MD  ketotifen (ZADITOR) 0.025 % ophthalmic solution Place 1 drop into both eyes daily as needed (dry eyes).    Historical Provider, MD  Lancet Devices [facility]) lancets Use as instructed for blood glucose checks four times daily, before meals and at bedtime 11/29/14   Frazier Richards, MD  losartan (COZAAR) 25 MG tablet Take 25 mg by mouth daily.    Historical Provider, MD  medroxyPROGESTERone (DEPO-PROVERA)  150 MG/ML injection Inject 150 mg into the muscle every 3 (three) months. 09/26/14   Historical Provider, MD  metFORMIN (GLUCOPHAGE-XR) 500 MG 24 hr tablet TK 2 TS PO DAILY 12/20/14   Historical Provider, MD  ondansetron (ZOFRAN ODT) 4 MG disintegrating tablet Take 1 tablet (4 mg total) by mouth every 8 (eight) hours as needed for nausea or vomiting. 01/29/16   Charlann Lange, PA-C  ondansetron (ZOFRAN) 4 MG tablet Take 1 tablet (4 mg total) by mouth every 6 (six) hours. 01/13/16   Isla Pence, MD  promethazine (PHENERGAN) 25 MG tablet Take 25 mg by mouth every 6  (six) hours as needed for nausea or vomiting.    Historical Provider, MD  pseudoephedrine (SUDAFED) 120 MG 12 hr tablet Take 120 mg by mouth daily as needed for congestion.    Historical Provider, MD  SUMAtriptan (IMITREX) 100 MG tablet Take 1 tablet (100 mg total) by mouth every 2 (two) hours as needed for migraine or headache. 11/29/14   Frazier Richards, MD  TRUEPLUS LANCETS 33G MISC USE TO CHECK BLOOD GLUCOSE QID - AFTER MEALS AND AT BEDTIME 12/22/14   Historical Provider, MD  VENTOLIN HFA 108 (90 BASE) MCG/ACT inhaler Use as directed 2 puffs in the mouth or throat 4 (four) times daily as needed. 12/21/14   Historical Provider, MD    Family History Family History  Problem Relation Age of Onset  . Hypertension Mother   . Thyroid disease Mother   . Hypertension Father   . Stroke Father   . Diabetes Father   . Sleep apnea Father     Social History Social History  Substance Use Topics  . Smoking status: Never Smoker  . Smokeless tobacco: Never Used  . Alcohol use 0.0 oz/week     Comment: occasional      Allergies   Ibuprofen and Ketorolac   Review of Systems Review of Systems A complete 10 system review of systems was obtained and all systems are negative except as noted in the HPI and PMH.    Physical Exam Updated Vital Signs BP 147/97 (BP Location: Right Arm)   Pulse 95   Temp 98.2 F (36.8 C) (Oral)   Resp 22   SpO2 99%   Physical Exam  Constitutional: She is oriented to person, place, and time. She appears well-developed and well-nourished. No distress.  HENT:  Head: Normocephalic and atraumatic.  Nose: Nose normal.  Mouth/Throat: Oropharynx is clear and moist. No oropharyngeal exudate.  White plaque diffusely on the tongue that does not scrape off.  Eyes: Conjunctivae and EOM are normal. Pupils are equal, round, and reactive to light. No scleral icterus.  Neck: Normal range of motion. Neck supple. No JVD present. No tracheal deviation present. No thyromegaly  present.  Cardiovascular: Normal rate, regular rhythm and normal heart sounds.  Exam reveals no gallop and no friction rub.   No murmur heard. Pulmonary/Chest: Effort normal and breath sounds normal. No respiratory distress. She has no wheezes. She exhibits no tenderness.  Abdominal: Soft. Bowel sounds are normal. She exhibits no distension and no mass. There is no tenderness. There is no rebound and no guarding.  Genitourinary:  Genitourinary Comments: Trace blood seen coming from cervical os.  Abnormal lesions noted as well.  No DC.  No CMT or adnexal tenderness.  Musculoskeletal: Normal range of motion. She exhibits no edema or tenderness.  Lymphadenopathy:    She has no cervical adenopathy.  Neurological: She is alert and oriented to  person, place, and time. No cranial nerve deficit. She exhibits normal muscle tone.  Skin: Skin is warm and dry. No rash noted. No erythema. No pallor.  Nursing note and vitals reviewed.    ED Treatments / Results  DIAGNOSTIC STUDIES: Oxygen Saturation is 99% on RA, normal by my interpretation.    COORDINATION OF CARE: 12:34 AM Discussed treatment plan with pt at bedside and pt agreed to plan.  Labs (all labs ordered are listed, but only abnormal results are displayed) Labs Reviewed  CBC WITH DIFFERENTIAL/PLATELET - Abnormal; Notable for the following:       Result Value   WBC 12.9 (*)    RBC 5.43 (*)    Lymphs Abs 4.5 (*)    All other components within normal limits  BASIC METABOLIC PANEL - Abnormal; Notable for the following:    Chloride 99 (*)    Glucose, Bld 440 (*)    Creatinine, Ser 1.12 (*)    All other components within normal limits  URINALYSIS, ROUTINE W REFLEX MICROSCOPIC - Abnormal; Notable for the following:    Color, Urine STRAW (*)    Specific Gravity, Urine 1.036 (*)    Glucose, UA >=500 (*)    Squamous Epithelial / LPF 0-5 (*)    All other components within normal limits  WET PREP, GENITAL  HIV ANTIBODY (ROUTINE TESTING)    I-STAT BETA HCG BLOOD, ED ([facility], [facility], [facility] ONLY)  GC/CHLAMYDIA PROBE AMP ([place]) NOT AT [facility]    EKG  EKG Interpretation None       Radiology No results found.  Procedures Procedures (including critical care time)  Medications Ordered in ED Medications - No data to display   Initial Impression / Assessment and Plan / ED Course  I have reviewed the triage vital signs and the nursing notes.  Pertinent labs & imaging results that were available during my care of the patient were reviewed by me and considered in my medical decision making (see chart for details).       Patient presents to the ED for yeast infection in her mouth and vagina.  Physical exam shows thrush on the tongue, but it is unusual that it does not scrape off.  Will give diflucan for resistent thrust and advised her to see her PCP for close follow up regarding this recurrence.  She denies HIV, will send a blood test.  Pelvic exam pending.   Patient given diflucan for thrush, will Rx clotrimazole for vaginal skin yeast infection.  WP shows no vaginal yeast.  This is all on the outside skin. She appears well and in NAD.  VS remain within her normal limits and she Is safe for DC.   I personally performed the services described in this documentation, which was scribed in my presence. The recorded information has been reviewed and is accurate.     Final Clinical Impressions(s) / ED Diagnoses   Final diagnoses:  None    New Prescriptions New Prescriptions   No medications on file     Everlene Balls, MD 06/16/16 [phone]

## 2016-06-16 NOTE — ED Notes (Signed)
Pt departed in NAD, refused use of wheelchair.  

## 2016-06-17 LAB — GC/CHLAMYDIA PROBE AMP (~~LOC~~) NOT AT ARMC
Chlamydia: NEGATIVE
Neisseria Gonorrhea: NEGATIVE

## 2016-08-31 ENCOUNTER — Ambulatory Visit (HOSPITAL_COMMUNITY)
Admission: EM | Admit: 2016-08-31 | Discharge: 2016-08-31 | Disposition: A | Discharge status: Home / Self Care | Payer: 59 | Attending: Internal Medicine | Admitting: Internal Medicine

## 2016-08-31 ENCOUNTER — Encounter (HOSPITAL_COMMUNITY): Payer: Self-pay | Admitting: Emergency Medicine

## 2016-08-31 DIAGNOSIS — R1011 Right upper quadrant pain: Secondary | ICD-10-CM

## 2016-08-31 DIAGNOSIS — E1101 Type 2 diabetes mellitus with hyperosmolarity with coma: Secondary | ICD-10-CM | POA: Insufficient documentation

## 2016-08-31 DIAGNOSIS — I10 Essential (primary) hypertension: Secondary | ICD-10-CM | POA: Insufficient documentation

## 2016-08-31 DIAGNOSIS — Z833 Family history of diabetes mellitus: Secondary | ICD-10-CM | POA: Insufficient documentation

## 2016-08-31 DIAGNOSIS — Z8249 Family history of ischemic heart disease and other diseases of the circulatory system: Secondary | ICD-10-CM | POA: Insufficient documentation

## 2016-08-31 DIAGNOSIS — Z8349 Family history of other endocrine, nutritional and metabolic diseases: Secondary | ICD-10-CM | POA: Diagnosis not present

## 2016-08-31 DIAGNOSIS — Z888 Allergy status to other drugs, medicaments and biological substances status: Secondary | ICD-10-CM | POA: Insufficient documentation

## 2016-08-31 DIAGNOSIS — Z794 Long term (current) use of insulin: Secondary | ICD-10-CM | POA: Insufficient documentation

## 2016-08-31 DIAGNOSIS — E669 Obesity, unspecified: Secondary | ICD-10-CM | POA: Insufficient documentation

## 2016-08-31 DIAGNOSIS — R109 Unspecified abdominal pain: Secondary | ICD-10-CM | POA: Diagnosis present

## 2016-08-31 DIAGNOSIS — Z79899 Other long term (current) drug therapy: Secondary | ICD-10-CM | POA: Insufficient documentation

## 2016-08-31 DIAGNOSIS — Z823 Family history of stroke: Secondary | ICD-10-CM | POA: Insufficient documentation

## 2016-08-31 DIAGNOSIS — J45909 Unspecified asthma, uncomplicated: Secondary | ICD-10-CM | POA: Insufficient documentation

## 2016-08-31 DIAGNOSIS — E1110 Type 2 diabetes mellitus with ketoacidosis without coma: Secondary | ICD-10-CM | POA: Diagnosis not present

## 2016-08-31 LAB — CBC WITH DIFFERENTIAL/PLATELET
Basophils Absolute: 0 10*3/uL (ref 0.0–0.1)
Basophils Relative: 0 %
EOS ABS: 0.1 10*3/uL (ref 0.0–0.7)
Eosinophils Relative: 1 %
HEMATOCRIT: 42.3 % (ref 36.0–46.0)
HEMOGLOBIN: 14.1 g/dL (ref 12.0–15.0)
LYMPHS ABS: 2.9 10*3/uL (ref 0.7–4.0)
Lymphocytes Relative: 37 %
MCH: 28.3 pg (ref 26.0–34.0)
MCHC: 33.3 g/dL (ref 30.0–36.0)
MCV: 84.8 fL (ref 78.0–100.0)
Monocytes Absolute: 0.2 10*3/uL (ref 0.1–1.0)
Monocytes Relative: 3 %
NEUTROS ABS: 4.6 10*3/uL (ref 1.7–7.7)
NEUTROS PCT: 59 %
Platelets: 282 10*3/uL (ref 150–400)
RBC: 4.99 MIL/uL (ref 3.87–5.11)
RDW: 13.5 % (ref 11.5–15.5)
WBC: 7.9 10*3/uL (ref 4.0–10.5)

## 2016-08-31 LAB — COMPREHENSIVE METABOLIC PANEL
ALT: 26 U/L (ref 14–54)
ANION GAP: 11 (ref 5–15)
AST: 24 U/L (ref 15–41)
Albumin: 3.3 g/dL — ABNORMAL LOW (ref 3.5–5.0)
Alkaline Phosphatase: 131 U/L — ABNORMAL HIGH (ref 38–126)
BUN: 9 mg/dL (ref 6–20)
CHLORIDE: 105 mmol/L (ref 101–111)
CO2: 22 mmol/L (ref 22–32)
CREATININE: 0.74 mg/dL (ref 0.44–1.00)
Calcium: 8.9 mg/dL (ref 8.9–10.3)
Glucose, Bld: 402 mg/dL — ABNORMAL HIGH (ref 65–99)
POTASSIUM: 3.8 mmol/L (ref 3.5–5.1)
SODIUM: 138 mmol/L (ref 135–145)
Total Bilirubin: 0.5 mg/dL (ref 0.3–1.2)
Total Protein: 6.7 g/dL (ref 6.5–8.1)

## 2016-08-31 LAB — POCT URINALYSIS DIP (DEVICE)
Bilirubin Urine: NEGATIVE
Glucose, UA: 500 mg/dL — AB
Ketones, ur: NEGATIVE mg/dL
Leukocytes, UA: NEGATIVE
NITRITE: NEGATIVE
PROTEIN: NEGATIVE mg/dL
Specific Gravity, Urine: <=1.005 (ref 1.005–1.030)
UROBILINOGEN UA: 0.2 mg/dL (ref 0.0–1.0)
pH: 5.5 (ref 5.0–8.0)

## 2016-08-31 LAB — AMYLASE: AMYLASE: 60 U/L (ref 28–100)

## 2016-08-31 LAB — LIPASE, BLOOD: LIPASE: 25 U/L (ref 11–51)

## 2016-08-31 NOTE — ED Triage Notes (Signed)
Onset last Saturday.  Pain is in right side, lower ribs.  Pain is worse with eating.  Last bm was yesterday, and normal

## 2016-08-31 NOTE — ED Provider Notes (Signed)
[place]    CSN: [id number] Arrival date & time: 08/31/16  1303     History   Chief Complaint Chief Complaint  Patient presents with  . Abdominal Pain    HPI Shelly Shields is a 36 y.o. female. She presents today with one-week history of fairly constant dull aching in the right upper quadrant of her abdomen. Not pleuritic.  No fever, no malaise. Has not had this before.  No vomiting, no change in stools (have been loose since starting metformin).  Felt nauseous with dinner a couple nights ago, but stopped eating and nausea resolved.  No urinary discomfort, maybe a little urinary frequency.  No unusual vaginal discharge/bleeding.  No leg pain/swelling.     HPI  Past Medical History:  Diagnosis Date  . Asthma   . Diabetes mellitus without complication ([place])   . Hypertension   . Migraines   . Obesity     Patient Active Problem List   Diagnosis Date Noted  . Hypernatremia   . Asthma, chronic   . DKA (diabetic ketoacidoses) ([place]) 11/27/2014  . Diabetes mellitus, new onset ([place])   . Hyperglycemic hyperosmolar nonketotic coma ([place])   . Essential hypertension   . Asthma     History reviewed. No pertinent surgical history.    Home Medications    Prior to Admission medications   Medication Sig Start Date End Date Taking? Authorizing Provider  canagliflozin (INVOKANA) 300 MG TABS tablet Take 300 mg by mouth daily before breakfast.   Yes Historical Provider, MD  Blood Glucose Monitoring Suppl (ACCU-CHEK NANO SMARTVIEW) W/DEVICE KIT 1 Device by Does not apply route 4 (four) times daily - after meals and at bedtime. 11/29/14   Frazier Richards, MD  clotrimazole (LOTRIMIN) 1 % cream Apply to affected area 2 times daily 06/16/16   Everlene Balls, MD  escitalopram (LEXAPRO) 10 MG tablet Take 10 mg by mouth daily.    Historical Provider, MD  fexofenadine (ALLEGRA) 180 MG tablet Take 180 mg by mouth daily.    Historical Provider, MD  fluticasone (FLONASE) 50 MCG/ACT nasal  spray Place 2 sprays into both nostrils daily as needed for allergies.  09/22/14   Historical Provider, MD  glucose blood (ACCU-CHEK SMARTVIEW) test strip Check sugar 6 x daily Patient taking differently: Check sugar 6 x daily. Uses true test instead 11/29/14   Frazier Richards, MD  Insulin Glargine (LANTUS) 100 UNIT/ML Solostar Pen Inject 35 Units into the skin daily. 11/29/14   Frazier Richards, MD  Insulin Pen Needle (INSUPEN PEN NEEDLES) 32G X 4 MM MISC BD Pen Needles- brand specific. Inject insulin via insulin pen daily 11/29/14   Frazier Richards, MD  ketotifen (ZADITOR) 0.025 % ophthalmic solution Place 1 drop into both eyes daily as needed (dry eyes).    Historical Provider, MD  Lancet Devices [facility]) lancets Use as instructed for blood glucose checks four times daily, before meals and at bedtime 11/29/14   Frazier Richards, MD  losartan (COZAAR) 25 MG tablet Take 25 mg by mouth daily.    Historical Provider, MD  medroxyPROGESTERone (DEPO-PROVERA) 150 MG/ML injection Inject 150 mg into the muscle every 3 (three) months. 09/26/14   Historical Provider, MD  metFORMIN (GLUCOPHAGE-XR) 500 MG 24 hr tablet TK 2 TS PO DAILY 12/20/14   Historical Provider, MD  ondansetron (ZOFRAN ODT) 4 MG disintegrating tablet Take 1 tablet (4 mg total) by mouth every 8 (eight) hours as needed for nausea or vomiting.  01/29/16   Charlann Lange, PA-C  ondansetron (ZOFRAN) 4 MG tablet Take 1 tablet (4 mg total) by mouth every 6 (six) hours. 01/13/16   Isla Pence, MD  promethazine (PHENERGAN) 25 MG tablet Take 25 mg by mouth every 6 (six) hours as needed for nausea or vomiting.    Historical Provider, MD  pseudoephedrine (SUDAFED) 120 MG 12 hr tablet Take 120 mg by mouth daily as needed for congestion.    Historical Provider, MD  SUMAtriptan (IMITREX) 100 MG tablet Take 1 tablet (100 mg total) by mouth every 2 (two) hours as needed for migraine or headache. 11/29/14   Frazier Richards, MD  TRUEPLUS LANCETS 33G MISC USE TO CHECK BLOOD  GLUCOSE QID - AFTER MEALS AND AT BEDTIME 12/22/14   Historical Provider, MD  VENTOLIN HFA 108 (90 BASE) MCG/ACT inhaler Use as directed 2 puffs in the mouth or throat 4 (four) times daily as needed. 12/21/14   Historical Provider, MD    Family History Family History  Problem Relation Age of Onset  . Hypertension Mother   . Thyroid disease Mother   . Hypertension Father   . Stroke Father   . Diabetes Father   . Sleep apnea Father     Social History Social History  Substance Use Topics  . Smoking status: Never Smoker  . Smokeless tobacco: Never Used  . Alcohol use 0.0 oz/week     Comment: occasional      Allergies   Ibuprofen and Ketorolac   Review of Systems Review of Systems  All other systems reviewed and are negative.    Physical Exam Triage Vital Signs ED Triage Vitals  Enc Vitals Group     BP 08/31/16 1354 (!) 141/80     Pulse Rate 08/31/16 1354 91     Resp 08/31/16 1354 20     Temp 08/31/16 1354 99.2 F (37.3 C)     Temp Source 08/31/16 1354 Oral     SpO2 08/31/16 1354 99 %     Weight --      Height --      Pain Score 08/31/16 1350 2     Pain Loc --    Updated Vital Signs BP (!) 141/80 (BP Location: Left Arm) Comment (BP Location): large cuff  Pulse 91   Temp 99.2 F (37.3 C) (Oral)   Resp 20   SpO2 99%   Physical Exam  Constitutional: She is oriented to person, place, and time. No distress.  HENT:  Head: Atraumatic.  Eyes:  Conjugate gaze observed, no eye redness/discharge  Neck: Neck supple.  Cardiovascular: Normal rate and regular rhythm.   Pulmonary/Chest: No respiratory distress. She has no wheezes. She has no rales.  Lungs clear, symmetric breath sounds.  Abdominal: Soft. She exhibits no distension. There is no rebound and no guarding.  Reproducible tenderness right upper quadrant, liver edge palpable.    Musculoskeletal: Normal range of motion.  Neurological: She is alert and oriented to person, place, and time.  Skin: Skin is warm  and dry.  Nursing note and vitals reviewed.    UC Treatments / Results  Labs Results for orders placed or performed during the hospital encounter of 08/31/16  Comprehensive metabolic panel  Result Value Ref Range   Sodium 138 135 - 145 mmol/L   Potassium 3.8 3.5 - 5.1 mmol/L   Chloride 105 101 - 111 mmol/L   CO2 22 22 - 32 mmol/L   Glucose, Bld 402 (H) 65 - 99 mg/dL  BUN 9 6 - 20 mg/dL   Creatinine, Ser 0.74 0.44 - 1.00 mg/dL   Calcium 8.9 8.9 - 10.3 mg/dL   Total Protein 6.7 6.5 - 8.1 g/dL   Albumin 3.3 (L) 3.5 - 5.0 g/dL   AST 24 15 - 41 U/L   ALT 26 14 - 54 U/L   Alkaline Phosphatase 131 (H) 38 - 126 U/L   Total Bilirubin 0.5 0.3 - 1.2 mg/dL   GFR calc non Af Amer >60 >60 mL/min   GFR calc Af Amer >60 >60 mL/min   Anion gap 11 5 - 15  CBC with Differential  Result Value Ref Range   WBC 7.9 4.0 - 10.5 K/uL   RBC 4.99 3.87 - 5.11 MIL/uL   Hemoglobin 14.1 12.0 - 15.0 g/dL   HCT 42.3 36.0 - 46.0 %   MCV 84.8 78.0 - 100.0 fL   MCH 28.3 26.0 - 34.0 pg   MCHC 33.3 30.0 - 36.0 g/dL   RDW 13.5 11.5 - 15.5 %   Platelets 282 150 - 400 K/uL   Neutrophils Relative % 59 %   Neutro Abs 4.6 1.7 - 7.7 K/uL   Lymphocytes Relative 37 %   Lymphs Abs 2.9 0.7 - 4.0 K/uL   Monocytes Relative 3 %   Monocytes Absolute 0.2 0.1 - 1.0 K/uL   Eosinophils Relative 1 %   Eosinophils Absolute 0.1 0.0 - 0.7 K/uL   Basophils Relative 0 %   Basophils Absolute 0.0 0.0 - 0.1 K/uL  Amylase  Result Value Ref Range   Amylase 60 28 - 100 U/L  Lipase, blood  Result Value Ref Range   Lipase 25 11 - 51 U/L  POCT urinalysis dip (device)  Result Value Ref Range   Glucose, UA 500 (A) NEGATIVE mg/dL   Bilirubin Urine NEGATIVE NEGATIVE   Ketones, ur NEGATIVE NEGATIVE mg/dL   Specific Gravity, Urine <=1.005 1.005 - 1.030   Hgb urine dipstick TRACE (A) NEGATIVE   pH 5.5 5.0 - 8.0   Protein, ur NEGATIVE NEGATIVE mg/dL   Urobilinogen, UA 0.2 0.0 - 1.0 mg/dL   Nitrite NEGATIVE NEGATIVE   Leukocytes,  UA NEGATIVE NEGATIVE    Procedures Procedures (including critical care time) None today  Final Clinical Impressions(s) / UC Diagnoses   Final diagnoses:  Abdominal pain, acute, right upper quadrant   Several possible causes of right upper abdominal discomfort, including liver inflammation (from diabetes, or a medication), gallbladder inflammation, kidney stone.  Suspect liver inflammation.  Labs drawn to check this.  The urgent care will contact you with the results.  Need to establish with a new primary care provider.  [facility] operates on a sliding scale if this would be helpful.     Sherlene Shams, MD 09/01/16 [phone]

## 2016-08-31 NOTE — Discharge Instructions (Addendum)
Several possible causes of right upper abdominal discomfort, including liver inflammation (from diabetes, or a medication), gallbladder inflammation, kidney stone.  Suspect liver inflammation.  Labs drawn to check this.  The urgent care will contact you with the results.  Need to establish with a new primary care provider.  [facility] operates on a sliding scale if this would be helpful.

## 2016-11-17 ENCOUNTER — Emergency Department (HOSPITAL_COMMUNITY)
Admission: EM | Admit: 2016-11-17 | Discharge: 2016-11-17 | Disposition: A | Discharge status: Home / Self Care | Payer: 59 | Attending: Emergency Medicine | Admitting: Emergency Medicine

## 2016-11-17 ENCOUNTER — Encounter (HOSPITAL_COMMUNITY): Payer: Self-pay

## 2016-11-17 ENCOUNTER — Emergency Department (HOSPITAL_COMMUNITY): Payer: 59

## 2016-11-17 DIAGNOSIS — Z794 Long term (current) use of insulin: Secondary | ICD-10-CM | POA: Insufficient documentation

## 2016-11-17 DIAGNOSIS — J45909 Unspecified asthma, uncomplicated: Secondary | ICD-10-CM | POA: Diagnosis not present

## 2016-11-17 DIAGNOSIS — M79672 Pain in left foot: Secondary | ICD-10-CM

## 2016-11-17 DIAGNOSIS — E119 Type 2 diabetes mellitus without complications: Secondary | ICD-10-CM | POA: Insufficient documentation

## 2016-11-17 DIAGNOSIS — I10 Essential (primary) hypertension: Secondary | ICD-10-CM | POA: Diagnosis not present

## 2016-11-17 DIAGNOSIS — Z7984 Long term (current) use of oral hypoglycemic drugs: Secondary | ICD-10-CM | POA: Diagnosis not present

## 2016-11-17 DIAGNOSIS — Z79899 Other long term (current) drug therapy: Secondary | ICD-10-CM | POA: Diagnosis not present

## 2016-11-17 MED ORDER — TRAMADOL HCL 50 MG PO TABS: 50.0000 mg | ORAL_TABLET | Freq: Four times a day (QID) | ORAL | 0 refills | Status: DC | PRN

## 2016-11-17 NOTE — ED Triage Notes (Signed)
Patient complains of left foot pain after turning/rolling same after walking on uneven concrete. No obvious swelling nor deformity

## 2016-11-17 NOTE — ED Provider Notes (Signed)
Shelly Shields Provider Note   CSN: [id number] Arrival date & time: 11/17/16  1516  By signing my name below, I, Shelly Shields, attest that this documentation has been prepared under the direction and in the presence of Shelly Muskrat, MD. Electronically Signed: Mayer Shields, Scribe. 11/17/16. 5:28 PM.  History   Chief Complaint No chief complaint on file.  The history is provided by the patient. No language interpreter was used.    HPI Comments: Shelly Shields is a 36 y.o. female with PMHx of DM and HTN who presents to the Emergency Department complaining of constant, gradually worsening left-sided foot pain that began yesterday. She states she was walking on uneven pavement when she tripped, her foot bent downwards and heard a pop. She has associated swelling to the area, but this has improved with ice and resting. She denies taking any medications for the injury. She denies numbness or other associated symptoms. She notes a previous injury on her left knee but this knee is unaffected after her injury.  Past Medical History:  Diagnosis Date  . Asthma   . Diabetes mellitus without complication ([place])   . Hypertension   . Migraines   . Obesity     Patient Active Problem List   Diagnosis Date Noted  . Hypernatremia   . Asthma, chronic   . DKA (diabetic ketoacidoses) ([place]) 11/27/2014  . Diabetes mellitus, new onset ([place])   . Hyperglycemic hyperosmolar nonketotic coma ([place])   . Essential hypertension   . Asthma     History reviewed. No pertinent surgical history.  OB History    No data available       Home Medications    Prior to Admission medications   Medication Sig Start Date End Date Taking? Authorizing Provider  Blood Glucose Monitoring Suppl (ACCU-CHEK NANO SMARTVIEW) W/DEVICE KIT 1 Device by Does not apply route 4 (four) times daily - after meals and at bedtime. 11/29/14   Frazier Richards, MD  canagliflozin (INVOKANA) 300 MG TABS tablet Take 300 mg by mouth  daily before breakfast.    [provider]  clotrimazole (LOTRIMIN) 1 % cream Apply to affected area 2 times daily 06/16/16   Everlene Balls, MD  escitalopram (LEXAPRO) 10 MG tablet Take 10 mg by mouth daily.    [provider]  fexofenadine (ALLEGRA) 180 MG tablet Take 180 mg by mouth daily.    [provider]  fluticasone (FLONASE) 50 MCG/ACT nasal spray Place 2 sprays into both nostrils daily as needed for allergies.  09/22/14   [provider]  glucose blood (ACCU-CHEK SMARTVIEW) test strip Check sugar 6 x daily Patient taking differently: Check sugar 6 x daily. Uses true test instead 11/29/14   Frazier Richards, MD  Insulin Glargine (LANTUS) 100 UNIT/ML Solostar Pen Inject 35 Units into the skin daily. 11/29/14   Frazier Richards, MD  Insulin Pen Needle (INSUPEN PEN NEEDLES) 32G X 4 MM MISC BD Pen Needles- brand specific. Inject insulin via insulin pen daily 11/29/14   Frazier Richards, MD  ketotifen (ZADITOR) 0.025 % ophthalmic solution Place 1 drop into both eyes daily as needed (dry eyes).    [provider]  Lancet Devices [facility]) lancets Use as instructed for blood glucose checks four times daily, before meals and at bedtime 11/29/14   Frazier Richards, MD  losartan (COZAAR) 25 MG tablet Take 25 mg by mouth daily.    [provider]  medroxyPROGESTERone (DEPO-PROVERA) 150 MG/ML injection Inject  150 mg into the muscle every 3 (three) months. 09/26/14   [provider]  metFORMIN (GLUCOPHAGE-XR) 500 MG 24 hr tablet TK 2 TS PO DAILY 12/20/14   [provider]  ondansetron (ZOFRAN ODT) 4 MG disintegrating tablet Take 1 tablet (4 mg total) by mouth every 8 (eight) hours as needed for nausea or vomiting. 01/29/16   Charlann Lange, PA-C  ondansetron (ZOFRAN) 4 MG tablet Take 1 tablet (4 mg total) by mouth every 6 (six) hours. 01/13/16   Isla Pence, MD  promethazine (PHENERGAN) 25 MG tablet Take 25 mg by mouth every 6 (six) hours as  needed for nausea or vomiting.    [provider]  pseudoephedrine (SUDAFED) 120 MG 12 hr tablet Take 120 mg by mouth daily as needed for congestion.    [provider]  SUMAtriptan (IMITREX) 100 MG tablet Take 1 tablet (100 mg total) by mouth every 2 (two) hours as needed for migraine or headache. 11/29/14   Frazier Richards, MD  TRUEPLUS LANCETS 33G MISC USE TO CHECK BLOOD GLUCOSE QID - AFTER MEALS AND AT BEDTIME 12/22/14   [provider]  VENTOLIN HFA 108 (90 BASE) MCG/ACT inhaler Use as directed 2 puffs in the mouth or throat 4 (four) times daily as needed. 12/21/14   [provider]    Family History Family History  Problem Relation Age of Onset  . Hypertension Mother   . Thyroid disease Mother   . Hypertension Father   . Stroke Father   . Diabetes Father   . Sleep apnea Father     Social History Social History  Substance Use Topics  . Smoking status: Never Smoker  . Smokeless tobacco: Never Used  . Alcohol use 0.0 oz/week     Comment: occasional      Allergies   Ibuprofen and Ketorolac   Review of Systems Review of Systems  Constitutional: Negative for fever.  Respiratory: Negative for shortness of breath.   Cardiovascular: Negative for chest pain.  Musculoskeletal: Positive for arthralgias and joint swelling.       Negative aside from HPI  Skin:       Negative aside from HPI  Allergic/Immunologic: Negative for immunocompromised state.  Neurological: Negative for weakness and numbness.     Physical Exam Updated Vital Signs BP (!) 153/107   Pulse 88   Temp 98.8 F (37.1 C) (Oral)   Resp 18   SpO2 100%   Physical Exam  Constitutional: She is oriented to person, place, and time. She appears well-developed and well-nourished. No distress.  HENT:  Head: Normocephalic and atraumatic.  Eyes: Conjunctivae and EOM are normal.  Cardiovascular: Normal rate and regular rhythm.   Pulmonary/Chest: Effort normal and breath sounds  normal. No stridor. No respiratory distress.  Abdominal: She exhibits no distension.  Musculoskeletal: She exhibits no edema.       Left knee: Normal.       Right ankle: Normal.       Feet:  Neurological: She is alert and oriented to person, place, and time. No cranial nerve deficit.  Skin: Skin is warm and dry.  Psychiatric: She has a normal mood and affect.  Nursing note and vitals reviewed.    ED Treatments / Results  DIAGNOSTIC STUDIES: Oxygen Saturation is 100% on RA, normal by my interpretation.    COORDINATION OF CARE: 5:12 PM Discussed treatment plan with pt at bedside and pt agreed to plan.  Radiology Dg Foot Complete Left  Result Date:  11/17/2016 CLINICAL DATA:  Patient tripped and fell, hyperextending the foot. Pain lateral fifth metatarsal. EXAM: LEFT FOOT - COMPLETE 3+ VIEW COMPARISON:  None. FINDINGS: There is no evidence of fracture or dislocation. There is no evidence of arthropathy or other focal bone abnormality. Lateral soft tissue swelling. IMPRESSION: Negative for fracture. Electronically Signed   By: Staci Righter M.D.   On: 11/17/2016 16:40    Procedures Procedures (including critical care time)    Initial Impression / Assessment and Plan / ED Course  I have reviewed the triage vital signs and the nursing notes.  Pertinent labs & imaging results that were available during my care of the patient were reviewed by me and considered in my medical decision making (see chart for details).  And female presents one day after sustaining an injury to her left foot. Patient's ankle is unremarkable, the unremarkable, and there is otherwise no evidence for traumatic findings. Patient has likely soft tissue injury, though with some consideration of occult fracture. Patient is distally neurovascularly intact, had immobilization performed, was discharged with analgesia, orthopedic follow-up.  Final Clinical Impressions(s) / ED Diagnoses   Fall, initial  encounter Acute foot pain, left, initial encounter  New Prescriptions New Prescriptions   TRAMADOL (ULTRAM) 50 MG TABLET    Take 1 tablet (50 mg total) by mouth every 6 (six) hours as needed.       Shelly Muskrat, MD 11/17/16 [phone]

## 2016-11-17 NOTE — Discharge Instructions (Signed)
As discussed, with her foot pain it is important that you monitor your condition carefully, and sure to follow-up with our orthopedic colleagues in one week. Patient on medication as directed, and keep your foot immobilized in the provided postoperative shoe. Return here for concerning changes in your condition.

## 2017-09-20 ENCOUNTER — Encounter (HOSPITAL_COMMUNITY): Payer: 59

## 2017-09-21 ENCOUNTER — Encounter (HOSPITAL_COMMUNITY): Payer: Self-pay

## 2017-09-21 ENCOUNTER — Encounter (HOSPITAL_COMMUNITY): Payer: Self-pay | Admitting: *Deleted

## 2017-09-21 ENCOUNTER — Ambulatory Visit (HOSPITAL_COMMUNITY)
Admission: EM | Admit: 2017-09-21 | Discharge: 2017-09-21 | Disposition: A | Discharge status: Home / Self Care | Payer: 59 | Source: Home / Self Care

## 2017-09-21 ENCOUNTER — Emergency Department (HOSPITAL_BASED_OUTPATIENT_CLINIC_OR_DEPARTMENT_OTHER): Admit: 2017-09-21 | Discharge: 2017-09-21 | Disposition: A | Discharge status: Home / Self Care | Payer: 59

## 2017-09-21 ENCOUNTER — Emergency Department (HOSPITAL_COMMUNITY)
Admission: EM | Admit: 2017-09-21 | Discharge: 2017-09-21 | Disposition: A | Discharge status: Home / Self Care | Payer: 59 | Attending: Emergency Medicine | Admitting: Emergency Medicine

## 2017-09-21 ENCOUNTER — Other Ambulatory Visit: Payer: Self-pay

## 2017-09-21 DIAGNOSIS — M722 Plantar fascial fibromatosis: Secondary | ICD-10-CM

## 2017-09-21 DIAGNOSIS — I10 Essential (primary) hypertension: Secondary | ICD-10-CM | POA: Insufficient documentation

## 2017-09-21 DIAGNOSIS — M79672 Pain in left foot: Secondary | ICD-10-CM

## 2017-09-21 DIAGNOSIS — E1165 Type 2 diabetes mellitus with hyperglycemia: Secondary | ICD-10-CM

## 2017-09-21 DIAGNOSIS — M79609 Pain in unspecified limb: Secondary | ICD-10-CM

## 2017-09-21 DIAGNOSIS — Z794 Long term (current) use of insulin: Secondary | ICD-10-CM | POA: Diagnosis not present

## 2017-09-21 DIAGNOSIS — E119 Type 2 diabetes mellitus without complications: Secondary | ICD-10-CM | POA: Insufficient documentation

## 2017-09-21 DIAGNOSIS — R2242 Localized swelling, mass and lump, left lower limb: Secondary | ICD-10-CM | POA: Diagnosis present

## 2017-09-21 DIAGNOSIS — J45909 Unspecified asthma, uncomplicated: Secondary | ICD-10-CM | POA: Insufficient documentation

## 2017-09-21 DIAGNOSIS — M79662 Pain in left lower leg: Secondary | ICD-10-CM

## 2017-09-21 DIAGNOSIS — M25572 Pain in left ankle and joints of left foot: Secondary | ICD-10-CM

## 2017-09-21 DIAGNOSIS — Z79899 Other long term (current) drug therapy: Secondary | ICD-10-CM | POA: Diagnosis not present

## 2017-09-21 MED ORDER — TRAMADOL HCL 50 MG PO TABS: 50.0000 mg | ORAL_TABLET | Freq: Four times a day (QID) | ORAL | 0 refills | Status: DC | PRN

## 2017-09-21 NOTE — ED Provider Notes (Signed)
[facility] EMERGENCY DEPARTMENT Provider Note   CSN: [id number] Arrival date & time: 09/21/17  1623     History   Chief Complaint Chief Complaint  Patient presents with  . Leg Swelling    HPI Shelly Shields is a 37 y.o. female.  HPI   37 year old female with history of diabetes, asthma, hypertension sent here from urgent care for concerns of potential blood clot.  Patient report for the past 3 days she has noticed increasing pain to her left leg including calf, ankle and foot with associated swelling.  She described pain as a sharp throbbing sensation, worse in the morning, worse with ambulation and moderate in severity.  She denies any associated fever, numbness or recent injury.  No history of gout.  She initially went to urgent care for her complaint.  They felt that this is likely an inflammatory response however, patient voiced concern for potential DVT since there is a significant family history of DVT with family members died from complication.  She also mention prior Achilles tendinitis causing similar symptoms.  She is here for further evaluation.  Patient is an insulin-dependent diabetic.  No complaints of chest pain, shortness of breath, lightheadedness or dizziness or fever.  Past Medical History:  Diagnosis Date  . Asthma   . Diabetes mellitus without complication ([place])   . Hypertension   . Migraines   . Obesity     Patient Active Problem List   Diagnosis Date Noted  . Hypernatremia   . Asthma, chronic   . DKA (diabetic ketoacidoses) ([place]) 11/27/2014  . Diabetes mellitus, new onset ([place])   . Hyperglycemic hyperosmolar nonketotic coma ([place])   . Essential hypertension   . Asthma     History reviewed. No pertinent surgical history.   OB History   None      Home Medications    Prior to Admission medications   Medication Sig Start Date End Date Taking? Authorizing Provider  Blood Glucose Monitoring Suppl (ACCU-CHEK NANO SMARTVIEW)  W/DEVICE KIT 1 Device by Does not apply route 4 (four) times daily - after meals and at bedtime. 11/29/14   Frazier Richards, MD  canagliflozin (INVOKANA) 300 MG TABS tablet Take 300 mg by mouth daily before breakfast.    [provider]  clotrimazole (LOTRIMIN) 1 % cream Apply to affected area 2 times daily 06/16/16   Everlene Balls, MD  escitalopram (LEXAPRO) 10 MG tablet Take 10 mg by mouth daily.    [provider]  fexofenadine (ALLEGRA) 180 MG tablet Take 180 mg by mouth daily.    [provider]  fluticasone (FLONASE) 50 MCG/ACT nasal spray Place 2 sprays into both nostrils daily as needed for allergies.  09/22/14   [provider]  glucose blood (ACCU-CHEK SMARTVIEW) test strip Check sugar 6 x daily Patient taking differently: Check sugar 6 x daily. Uses true test instead 11/29/14   Frazier Richards, MD  Insulin Glargine (LANTUS) 100 UNIT/ML Solostar Pen Inject 35 Units into the skin daily. 11/29/14   Frazier Richards, MD  Insulin Pen Needle (INSUPEN PEN NEEDLES) 32G X 4 MM MISC BD Pen Needles- brand specific. Inject insulin via insulin pen daily 11/29/14   Frazier Richards, MD  ketotifen (ZADITOR) 0.025 % ophthalmic solution Place 1 drop into both eyes daily as needed (dry eyes).    [provider]  Lancet Devices [facility]) lancets Use as instructed for blood glucose checks four times daily, before meals and at  bedtime 11/29/14   Frazier Richards, MD  losartan (COZAAR) 25 MG tablet Take 25 mg by mouth daily.    [provider]  medroxyPROGESTERone (DEPO-PROVERA) 150 MG/ML injection Inject 150 mg into the muscle every 3 (three) months. 09/26/14   [provider]  metFORMIN (GLUCOPHAGE-XR) 500 MG 24 hr tablet TK 2 TS PO DAILY 12/20/14   [provider]  ondansetron (ZOFRAN ODT) 4 MG disintegrating tablet Take 1 tablet (4 mg total) by mouth every 8 (eight) hours as needed for nausea or vomiting. 01/29/16   Charlann Lange, PA-C  ondansetron  (ZOFRAN) 4 MG tablet Take 1 tablet (4 mg total) by mouth every 6 (six) hours. 01/13/16   Isla Pence, MD  promethazine (PHENERGAN) 25 MG tablet Take 25 mg by mouth every 6 (six) hours as needed for nausea or vomiting.    [provider]  pseudoephedrine (SUDAFED) 120 MG 12 hr tablet Take 120 mg by mouth daily as needed for congestion.    [provider]  SUMAtriptan (IMITREX) 100 MG tablet Take 1 tablet (100 mg total) by mouth every 2 (two) hours as needed for migraine or headache. 11/29/14   Frazier Richards, MD  traMADol (ULTRAM) 50 MG tablet Take 1 tablet (50 mg total) by mouth every 6 (six) hours as needed. 11/17/16   [name], MD  TRUEPLUS LANCETS 33G MISC USE TO CHECK BLOOD GLUCOSE QID - AFTER MEALS AND AT BEDTIME 12/22/14   [provider]  VENTOLIN HFA 108 (90 BASE) MCG/ACT inhaler Use as directed 2 puffs in the mouth or throat 4 (four) times daily as needed. 12/21/14   [provider]    Family History Family History  Problem Relation Age of Onset  . Hypertension Mother   . Thyroid disease Mother   . Hypertension Father   . Stroke Father   . Diabetes Father   . Sleep apnea Father     Social History Social History   Tobacco Use  . Smoking status: Never Smoker  . Smokeless tobacco: Never Used  Substance Use Topics  . Alcohol use: Yes    Alcohol/week: 0.0 oz    Comment: occasional   . Drug use: No     Allergies   Ibuprofen and Ketorolac   Review of Systems Review of Systems  All other systems reviewed and are negative.    Physical Exam Updated Vital Signs BP (!) 151/83 (BP Location: Right Arm)   Pulse 84   Temp 99.1 F (37.3 C) (Oral)   Resp 19   Ht _0  (1.549 m)   Wt 101.2 kg (223 lb)   SpO2 100%   BMI 42.14 kg/m   Physical Exam  Constitutional: She appears well-developed and well-nourished. No distress.  HENT:  Head: Atraumatic.  Eyes: Conjunctivae are normal.  Neck: Neck supple.  Musculoskeletal: She  exhibits tenderness (Left lower extremity: Mild tenderness noted to the medial left ankle on palpation with mild swelling but normal ankle range of motion.  Tenderness to the heel and along the sole of left foot.  Patient has pes planus.  DP pulse palpable brisk cap refill).  Neurological: She is alert.  Skin: No rash noted.  Psychiatric: She has a normal mood and affect.  Nursing note and vitals reviewed.    ED Treatments / Results  Labs (all labs ordered are listed, but only abnormal results are displayed) Labs Reviewed - No data to display  EKG None  Radiology No results found.  Procedures  Procedures (including critical care time)  Signed           Show:Clear all _0 Manual_1 Template_2 Copied  Added by: _3 Mauro Kaufmann, Candace R, RVS   _4 Hover for details   VASCULAR LAB PRELIMINARY  PRELIMINARY  PRELIMINARY  PRELIMINARY  Left lower extremity venous duplex completed.    Preliminary report:  There is no DVT or SVT noted in the left lower extremity.  Called results to [place], RN  KANADY, [place], RVT 09/21/2017, 6:38 PM           Medications Ordered in ED Medications - No data to display   Initial Impression / Assessment and Plan / ED Course  I have reviewed the triage vital signs and the nursing notes.  Pertinent labs & imaging results that were available during my care of the patient were reviewed by me and considered in my medical decision making (see chart for details).     BP (!) 151/83 (BP Location: Right Arm)   Pulse 84   Temp 99.1 F (37.3 C) (Oral)   Resp 19   Ht _5  (1.549 m)   Wt 101.2 kg (223 lb)   SpO2 100%   BMI 42.14 kg/m    Final Clinical Impressions(s) / ED Diagnoses   Final diagnoses:  Plantar fasciitis of left foot    ED Discharge Orders        Ordered    traMADol (ULTRAM) 50 MG tablet  Every 6 hours PRN     09/21/17 2103     9:01 PM Patient here with left low sugar knee pain and swelling concerning for  potential DVT according to patient.  Venous Doppler ultrasound study is negative for DVT.  On exam, patient has evidence of pes planus and tenderness along the heel and sole forefoot suggestive of plantar fasciitis.  No evidence to suggest septic joint or cellulitis.  She is neurovascularly intact.  Will provide instruction on appropriate treatment for plantar fasciitis.  Patient also request an ankle brace, ASO provided.  She is able to ambulate.  Return precautions discussed.  I have low suspicion for acute fractures or dislocation has been no recent significant injury. In order to decrease risk of narcotic abuse. Pt's record were checked using the [place] Controlled Substance database.     Domenic Moras, PA-C 09/21/17 2105    Daleen Bo, MD 09/22/17 1400

## 2017-09-21 NOTE — Progress Notes (Signed)
VASCULAR LAB PRELIMINARY  PRELIMINARY  PRELIMINARY  PRELIMINARY  Left lower extremity venous duplex completed.    Preliminary report:  There is no DVT or SVT noted in the left lower extremity.  Called results to [place], RN  , , RVT 09/21/2017, 6:38 PM

## 2017-09-21 NOTE — ED Provider Notes (Addendum)
MRN: [id number] DOB: 15-Sep-1980  Subjective:   Shelly Shields is a 37 y.o. female presenting for 3-day history of worsening left calf, ankle and foot pain.  Patient reports that she also has swelling.  She is very concerned about having a clot given that multiple family members have had a clot, 2 of them passed away from this.  She denies fever, falls, trauma, hospitalizations, recent surgeries.  She denies redness, warmth.  Denies smoking cigarettes.  She is a diabetic on insulin.  No current facility-administered medications for this encounter.   Current Outpatient Medications:  .  Blood Glucose Monitoring Suppl (ACCU-CHEK NANO SMARTVIEW) W/DEVICE KIT, 1 Device by Does not apply route 4 (four) times daily - after meals and at bedtime., Disp: 1 kit, Rfl: 0 .  canagliflozin (INVOKANA) 300 MG TABS tablet, Take 300 mg by mouth daily before breakfast., Disp: , Rfl:  .  clotrimazole (LOTRIMIN) 1 % cream, Apply to affected area 2 times daily, Disp: 15 g, Rfl: 0 .  escitalopram (LEXAPRO) 10 MG tablet, Take 10 mg by mouth daily., Disp: , Rfl:  .  fexofenadine (ALLEGRA) 180 MG tablet, Take 180 mg by mouth daily., Disp: , Rfl:  .  fluticasone (FLONASE) 50 MCG/ACT nasal spray, Place 2 sprays into both nostrils daily as needed for allergies. , Disp: , Rfl: 3 .  glucose blood (ACCU-CHEK SMARTVIEW) test strip, Check sugar 6 x daily (Patient taking differently: Check sugar 6 x daily. Uses true test instead), Disp: 200 each, Rfl: 3 .  Insulin Glargine (LANTUS) 100 UNIT/ML Solostar Pen, Inject 35 Units into the skin daily., Disp: 15 mL, Rfl: 11 .  Insulin Pen Needle (INSUPEN PEN NEEDLES) 32G X 4 MM MISC, BD Pen Needles- brand specific. Inject insulin via insulin pen daily, Disp: 200 each, Rfl: 3 .  ketotifen (ZADITOR) 0.025 % ophthalmic solution, Place 1 drop into both eyes daily as needed (dry eyes)., Disp: , Rfl:  .  Lancet Devices (ACCU-CHEK SOFTCLIX) lancets, Use as instructed for blood glucose checks four  times daily, before meals and at bedtime, Disp: 100 each, Rfl: 5 .  losartan (COZAAR) 25 MG tablet, Take 25 mg by mouth daily., Disp: , Rfl:  .  medroxyPROGESTERone (DEPO-PROVERA) 150 MG/ML injection, Inject 150 mg into the muscle every 3 (three) months., Disp: , Rfl: 3 .  metFORMIN (GLUCOPHAGE-XR) 500 MG 24 hr tablet, TK 2 TS PO DAILY, Disp: , Rfl: 0 .  ondansetron (ZOFRAN ODT) 4 MG disintegrating tablet, Take 1 tablet (4 mg total) by mouth every 8 (eight) hours as needed for nausea or vomiting., Disp: 20 tablet, Rfl: 0 .  ondansetron (ZOFRAN) 4 MG tablet, Take 1 tablet (4 mg total) by mouth every 6 (six) hours., Disp: 12 tablet, Rfl: 0 .  promethazine (PHENERGAN) 25 MG tablet, Take 25 mg by mouth every 6 (six) hours as needed for nausea or vomiting., Disp: , Rfl:  .  pseudoephedrine (SUDAFED) 120 MG 12 hr tablet, Take 120 mg by mouth daily as needed for congestion., Disp: , Rfl:  .  SUMAtriptan (IMITREX) 100 MG tablet, Take 1 tablet (100 mg total) by mouth every 2 (two) hours as needed for migraine or headache., Disp: 10 tablet, Rfl: 1 .  traMADol (ULTRAM) 50 MG tablet, Take 1 tablet (50 mg total) by mouth every 6 (six) hours as needed., Disp: 15 tablet, Rfl: 0 .  TRUEPLUS LANCETS 33G MISC, USE TO CHECK BLOOD GLUCOSE QID - AFTER MEALS AND AT BEDTIME, Disp: , Rfl: 3 .  VENTOLIN HFA 108 (90 BASE) MCG/ACT inhaler, Use as directed 2 puffs in the mouth or throat 4 (four) times daily as needed., Disp: , Rfl: 3   Allergies  Allergen Reactions  . Ibuprofen   . Ketorolac     Past Medical History:  Diagnosis Date  . Asthma   . Diabetes mellitus without complication ([place])   . Hypertension   . Migraines   . Obesity      History reviewed. No pertinent surgical history.  Objective:   Vitals: BP (!) 144/103 (BP Location: Left Arm)   Pulse 82   Temp 98.4 F (36.9 C) (Oral)   Resp 20   SpO2 98%   Physical Exam  Constitutional: She is oriented to person, place, and time. She appears  well-developed and well-nourished.  Cardiovascular: Normal rate.  Pulmonary/Chest: Effort normal.  Musculoskeletal:       Left ankle: She exhibits swelling (trace over ankle). She exhibits normal range of motion, no ecchymosis, no deformity and no laceration. Tenderness. Lateral malleolus tenderness found. No medial malleolus tenderness found. Achilles tendon exhibits pain. Achilles tendon exhibits no defect and normal Thompson's test results.       Left lower leg: She exhibits tenderness (positive Homan sign). She exhibits no bony tenderness, no swelling, no edema, no deformity and no laceration.       Left foot: There is tenderness (over areas depicted) and swelling. There is normal range of motion, no bony tenderness, normal capillary refill, no crepitus, no deformity and no laceration.       Feet:  There is no warmth or redness along her left lower extremity.  Neurological: She is alert and oriented to person, place, and time.    Assessment and Plan :   Pain of left calf  Acute left ankle pain  Left foot pain  Patient is very concerned that she has a clot and I informed her that I cannot prove that she does not without an ultrasound.  She states that she cannot tolerate NSAIDs and using a steroid course without specific exam findings for Achilles tendinitis is very risky for her given her uncontrolled diabetes.  Patient states that she is going to go to the ER to make sure she does not have a lower leg clot.  I counseled patient that she could be having an inflammatory process as opposed to a clot but she prefers to just go to the ER.   Jaynee Eagles, PA-C 09/21/17 1600

## 2017-09-21 NOTE — ED Triage Notes (Signed)
Ankle/leg is swollen and this started thursday and she is a diabetic,

## 2017-09-21 NOTE — ED Triage Notes (Signed)
PT to ED after being seen at [facility] and is concerned she may have blood clot d/t pain and swelling to left lower leg since Thursday. PT was told at [facility] she may have inflammatory process as opposed to clot but she preferred to have [place] to r/o clot.

## 2017-09-21 NOTE — Progress Notes (Signed)
Orthopedic Tech Progress Note Patient Details:  Shelly Shields 28-Sep-1980 [id number]  Ortho Devices Type of Ortho Device: Crutches, ASO Ortho Device/Splint Location: LLE Ortho Device/Splint Interventions: Ordered, Application, Adjustment   Post Interventions Patient Tolerated: Well Instructions Provided: Care of device   Jennye Moccasin 09/21/2017, 9:33 PM

## 2018-03-27 ENCOUNTER — Emergency Department (HOSPITAL_COMMUNITY)
Admission: EM | Admit: 2018-03-27 | Discharge: 2018-03-27 | Disposition: A | Discharge status: Home / Self Care | Payer: 59 | Attending: Emergency Medicine | Admitting: Emergency Medicine

## 2018-03-27 ENCOUNTER — Emergency Department (HOSPITAL_COMMUNITY): Payer: 59

## 2018-03-27 ENCOUNTER — Encounter (HOSPITAL_COMMUNITY): Payer: Self-pay | Admitting: Emergency Medicine

## 2018-03-27 ENCOUNTER — Other Ambulatory Visit: Payer: Self-pay

## 2018-03-27 DIAGNOSIS — W010XXA Fall on same level from slipping, tripping and stumbling without subsequent striking against object, initial encounter: Secondary | ICD-10-CM | POA: Diagnosis not present

## 2018-03-27 DIAGNOSIS — E119 Type 2 diabetes mellitus without complications: Secondary | ICD-10-CM | POA: Diagnosis not present

## 2018-03-27 DIAGNOSIS — Y999 Unspecified external cause status: Secondary | ICD-10-CM | POA: Diagnosis not present

## 2018-03-27 DIAGNOSIS — Y9301 Activity, walking, marching and hiking: Secondary | ICD-10-CM | POA: Diagnosis not present

## 2018-03-27 DIAGNOSIS — S8391XA Sprain of unspecified site of right knee, initial encounter: Secondary | ICD-10-CM | POA: Diagnosis not present

## 2018-03-27 DIAGNOSIS — Z794 Long term (current) use of insulin: Secondary | ICD-10-CM | POA: Insufficient documentation

## 2018-03-27 DIAGNOSIS — J45909 Unspecified asthma, uncomplicated: Secondary | ICD-10-CM | POA: Insufficient documentation

## 2018-03-27 DIAGNOSIS — Y92009 Unspecified place in unspecified non-institutional (private) residence as the place of occurrence of the external cause: Secondary | ICD-10-CM | POA: Diagnosis not present

## 2018-03-27 DIAGNOSIS — S8991XA Unspecified injury of right lower leg, initial encounter: Secondary | ICD-10-CM | POA: Diagnosis present

## 2018-03-27 DIAGNOSIS — I10 Essential (primary) hypertension: Secondary | ICD-10-CM | POA: Insufficient documentation

## 2018-03-27 DIAGNOSIS — Z79899 Other long term (current) drug therapy: Secondary | ICD-10-CM | POA: Insufficient documentation

## 2018-03-27 HISTORY — DX: Sleep apnea, unspecified: G47.30

## 2018-03-27 NOTE — Discharge Instructions (Addendum)
Take Tylenol for pain. Keep your knee elevated when able.  Apply ice for 20 minutes at a time 3-4 times a day. Use a knee sleeve as needed for support and compression.  Use crutches as needed for pain control. Follow-up with your primary care doctor in 1 week if your pain is not improving. Return to the emergency room if you develop severe worsening pain, numbness, or any new or concerning symptoms.

## 2018-03-27 NOTE — ED Notes (Signed)
Patient verbalizes understanding of discharge instructions. Opportunity for questioning and answers were provided. Armband removed by staff, pt discharged from ED home via POV.  

## 2018-03-27 NOTE — ED Provider Notes (Signed)
[facility] EMERGENCY DEPARTMENT Provider Note   CSN: [id number] Arrival date & time: 03/27/18  1922     History   Chief Complaint Chief Complaint  Patient presents with  . Knee Pain    HPI Shelly Shields is a 37 y.o. female senting for evaluation of right knee pain.  Patient states she was leaving her mom's house when she slipped on ice, twisted her right knee and it folded under her.  Since then, she has been having pain of the lateral aspect of her right knee.  She has been ambulating out difficulty, but reports pain is worse when ambulating.  No pain at rest.  She has not taken anything for pain including Tylenol or ibuprofen.  No radiation of the pain.  She denies numbness or tingling.  She denies hitting her head or loss of consciousness during the fall.  She denies injury elsewhere.  She denies a history of problems with her knee.  She reports a history of hypertension for which she takes medication, no other medical problems.  She is not on blood thinners.  HPI  Past Medical History:  Diagnosis Date  . Asthma   . Diabetes mellitus without complication ([place])   . Hypertension   . Migraines   . Obesity   . Sleep apnea     Patient Active Problem List   Diagnosis Date Noted  . Hypernatremia   . Asthma, chronic   . DKA (diabetic ketoacidoses) ([place]) 11/27/2014  . Diabetes mellitus, new onset ([place])   . Hyperglycemic hyperosmolar nonketotic coma ([place])   . Essential hypertension   . Asthma     Past Surgical History:  Procedure Laterality Date  . BILATERAL CARPAL TUNNEL RELEASE       OB History   None      Home Medications    Prior to Admission medications   Medication Sig Start Date End Date Taking? Authorizing Provider  Blood Glucose Monitoring Suppl (ACCU-CHEK NANO SMARTVIEW) W/DEVICE KIT 1 Device by Does not apply route 4 (four) times daily - after meals and at bedtime. 11/29/14   Frazier Richards, MD  canagliflozin (INVOKANA) 300 MG TABS  tablet Take 300 mg by mouth daily before breakfast.    [provider]  clotrimazole (LOTRIMIN) 1 % cream Apply to affected area 2 times daily 06/16/16   Everlene Balls, MD  escitalopram (LEXAPRO) 10 MG tablet Take 10 mg by mouth daily.    [provider]  fexofenadine (ALLEGRA) 180 MG tablet Take 180 mg by mouth daily.    [provider]  fluticasone (FLONASE) 50 MCG/ACT nasal spray Place 2 sprays into both nostrils daily as needed for allergies.  09/22/14   [provider]  glucose blood (ACCU-CHEK SMARTVIEW) test strip Check sugar 6 x daily Patient taking differently: Check sugar 6 x daily. Uses true test instead 11/29/14   Frazier Richards, MD  Insulin Glargine (LANTUS) 100 UNIT/ML Solostar Pen Inject 35 Units into the skin daily. 11/29/14   Frazier Richards, MD  Insulin Pen Needle (INSUPEN PEN NEEDLES) 32G X 4 MM MISC BD Pen Needles- brand specific. Inject insulin via insulin pen daily 11/29/14   Frazier Richards, MD  ketotifen (ZADITOR) 0.025 % ophthalmic solution Place 1 drop into both eyes daily as needed (dry eyes).    [provider]  Lancet Devices [facility]) lancets Use as instructed for blood glucose checks four times daily, before meals and at bedtime 11/29/14  Frazier Richards, MD  losartan (COZAAR) 25 MG tablet Take 25 mg by mouth daily.    [provider]  medroxyPROGESTERone (DEPO-PROVERA) 150 MG/ML injection Inject 150 mg into the muscle every 3 (three) months. 09/26/14   [provider]  metFORMIN (GLUCOPHAGE-XR) 500 MG 24 hr tablet TK 2 TS PO DAILY 12/20/14   [provider]  ondansetron (ZOFRAN ODT) 4 MG disintegrating tablet Take 1 tablet (4 mg total) by mouth every 8 (eight) hours as needed for nausea or vomiting. 01/29/16   Charlann Lange, PA-C  ondansetron (ZOFRAN) 4 MG tablet Take 1 tablet (4 mg total) by mouth every 6 (six) hours. 01/13/16   Isla Pence, MD  promethazine (PHENERGAN) 25 MG tablet Take 25 mg by  mouth every 6 (six) hours as needed for nausea or vomiting.    [provider]  pseudoephedrine (SUDAFED) 120 MG 12 hr tablet Take 120 mg by mouth daily as needed for congestion.    [provider]  SUMAtriptan (IMITREX) 100 MG tablet Take 1 tablet (100 mg total) by mouth every 2 (two) hours as needed for migraine or headache. 11/29/14   Frazier Richards, MD  traMADol (ULTRAM) 50 MG tablet Take 1 tablet (50 mg total) by mouth every 6 (six) hours as needed. 09/21/17   Domenic Moras, PA-C  TRUEPLUS LANCETS 33G MISC USE TO CHECK BLOOD GLUCOSE QID - AFTER MEALS AND AT BEDTIME 12/22/14   [provider]  VENTOLIN HFA 108 (90 BASE) MCG/ACT inhaler Use as directed 2 puffs in the mouth or throat 4 (four) times daily as needed. 12/21/14   [provider]    Family History Family History  Problem Relation Age of Onset  . Hypertension Mother   . Thyroid disease Mother   . Hypertension Father   . Stroke Father   . Diabetes Father   . Sleep apnea Father     Social History Social History   Tobacco Use  . Smoking status: Never Smoker  . Smokeless tobacco: Never Used  Substance Use Topics  . Alcohol use: Yes    Alcohol/week: 0.0 standard drinks    Comment: occasional   . Drug use: No     Allergies   Ibuprofen and Ketorolac   Review of Systems Review of Systems  Musculoskeletal: Positive for arthralgias. Negative for gait problem.  Hematological: Does not bruise/bleed easily.     Physical Exam Updated Vital Signs BP (!) 163/102 (BP Location: Right Arm)   Pulse 92   Temp 98.3 F (36.8 C) (Oral)   Resp 18   Ht _0  (1.549 m)   Wt 102.1 kg   SpO2 100%   BMI 42.51 kg/m   Physical Exam  Constitutional: She is oriented to person, place, and time. She appears well-developed and well-nourished. No distress.  HENT:  Head: Normocephalic and atraumatic.  Eyes: EOM are normal.  Neck: Normal range of motion.  Pulmonary/Chest: Effort normal.  Abdominal:  She exhibits no distension.  Musculoskeletal: Normal range of motion. She exhibits tenderness. She exhibits no edema or deformity.  No obvious swelling or deformity of the right knee.  Mild tenderness palpation of the lateral aspect of the right knee.  No pain with varus or valgus stress. Negative anterior and posterior drawer tests.   No tenderness palpation the anterior, medial, or posterior knee.  No tenderness palpation of the calf or thigh.  Sensation of lower extremities intact bilaterally.  Pedal pulses intact bilaterally.  Soft compartments.  Patient  is ambulatory.  Neurological: She is alert and oriented to person, place, and time. No sensory deficit.  Skin: Skin is warm. Capillary refill takes less than 2 seconds. No rash noted.  Psychiatric: She has a normal mood and affect.  Nursing note and vitals reviewed.    ED Treatments / Results  Labs (all labs ordered are listed, but only abnormal results are displayed) Labs Reviewed - No data to display  EKG None  Radiology Dg Knee Complete 4 Views Right  Result Date: 03/27/2018 CLINICAL DATA:  37 y/o  F; twisting injury of the right knee. EXAM: RIGHT KNEE - COMPLETE 4+ VIEW COMPARISON:  None. FINDINGS: No evidence of fracture, dislocation, or joint effusion. No evidence of arthropathy or other focal bone abnormality. Soft tissues are unremarkable. IMPRESSION: Negative. Electronically Signed   By: Kristine Garbe M.D.   On: 03/27/2018 20:16    Procedures Procedures (including critical care time)  Medications Ordered in ED Medications - No data to display   Initial Impression / Assessment and Plan / ED Course  I have reviewed the triage vital signs and the nursing notes.  Pertinent labs & imaging results that were available during my care of the patient were reviewed by me and considered in my medical decision making (see chart for details).     Pt presenting for evaluation of right knee pain after fall today.   Physical exam reassuring, she is neurovascularly intact.  Mild tenderness palpation of the lateral knee.  No pain with varus or valgus stress.  Patient is ambulatory.  X-ray viewed interpreted by me, no fracture dislocation.  This correlates clinically.  Likely sprain/MSK irritation.  Discussed rest, ice, and elevation.  Patient states she cannot take ibuprofen, encouraged her to take tylenol for pain control.  Patient has crutches at home which she can use as needed for severe pain.  Follow-up with PCP as needed.  At this time, patient appears safe for discharge.  Return precautions given.  Patient states understands and agrees plan.  Final Clinical Impressions(s) / ED Diagnoses   Final diagnoses:  Sprain of right knee, unspecified ligament, initial encounter    ED Discharge Orders    None       Franchot Heidelberg, PA-C 03/27/18 2155    Lennice Sites, DO 03/28/18 0158

## 2018-03-27 NOTE — ED Notes (Signed)
Pt complains of right knee pain after a fall this morning. Pt reports as she sits more her knee gets stiff.

## 2018-03-27 NOTE — ED Triage Notes (Signed)
Pt slipped on ice around 7:30am on her mom's porch and twisted R knee.  Denies pain when sitting.  C/o R knee pain when standing/walking.

## 2019-05-28 DIAGNOSIS — I2699 Other pulmonary embolism without acute cor pulmonale: Secondary | ICD-10-CM

## 2019-05-28 HISTORY — DX: Other pulmonary embolism without acute cor pulmonale: I26.99

## 2019-06-22 ENCOUNTER — Encounter (HOSPITAL_COMMUNITY): Payer: Self-pay

## 2019-06-22 ENCOUNTER — Ambulatory Visit (INDEPENDENT_AMBULATORY_CARE_PROVIDER_SITE_OTHER)
Admission: EM | Admit: 2019-06-22 | Discharge: 2019-06-22 | Disposition: A | Discharge status: Home / Self Care | Payer: 59 | Source: Home / Self Care

## 2019-06-22 ENCOUNTER — Other Ambulatory Visit: Payer: Self-pay

## 2019-06-22 ENCOUNTER — Emergency Department (HOSPITAL_COMMUNITY)
Admission: EM | Admit: 2019-06-22 | Discharge: 2019-06-22 | Disposition: A | Discharge status: Home / Self Care | Payer: 59 | Attending: Emergency Medicine | Admitting: Emergency Medicine

## 2019-06-22 ENCOUNTER — Encounter: Payer: Self-pay | Admitting: Emergency Medicine

## 2019-06-22 ENCOUNTER — Emergency Department (HOSPITAL_COMMUNITY): Payer: 59

## 2019-06-22 DIAGNOSIS — L03317 Cellulitis of buttock: Secondary | ICD-10-CM | POA: Insufficient documentation

## 2019-06-22 DIAGNOSIS — E11622 Type 2 diabetes mellitus with other skin ulcer: Secondary | ICD-10-CM | POA: Diagnosis not present

## 2019-06-22 DIAGNOSIS — E119 Type 2 diabetes mellitus without complications: Secondary | ICD-10-CM | POA: Insufficient documentation

## 2019-06-22 DIAGNOSIS — L0231 Cutaneous abscess of buttock: Secondary | ICD-10-CM

## 2019-06-22 DIAGNOSIS — L98419 Non-pressure chronic ulcer of buttock with unspecified severity: Secondary | ICD-10-CM

## 2019-06-22 DIAGNOSIS — Z794 Long term (current) use of insulin: Secondary | ICD-10-CM | POA: Diagnosis not present

## 2019-06-22 DIAGNOSIS — I10 Essential (primary) hypertension: Secondary | ICD-10-CM | POA: Insufficient documentation

## 2019-06-22 DIAGNOSIS — Z79899 Other long term (current) drug therapy: Secondary | ICD-10-CM | POA: Insufficient documentation

## 2019-06-22 LAB — CBC WITH DIFFERENTIAL/PLATELET
Abs Immature Granulocytes: 0.16 10*3/uL — ABNORMAL HIGH (ref 0.00–0.07)
Basophils Absolute: 0 10*3/uL (ref 0.0–0.1)
Basophils Relative: 0 %
Eosinophils Absolute: 0.1 10*3/uL (ref 0.0–0.5)
Eosinophils Relative: 1 %
HCT: 39.4 % (ref 36.0–46.0)
Hemoglobin: 13 g/dL (ref 12.0–15.0)
Immature Granulocytes: 1 %
Lymphocytes Relative: 12 %
Lymphs Abs: 1.6 10*3/uL (ref 0.7–4.0)
MCH: 28.9 pg (ref 26.0–34.0)
MCHC: 33 g/dL (ref 30.0–36.0)
MCV: 87.6 fL (ref 80.0–100.0)
Monocytes Absolute: 1 10*3/uL (ref 0.1–1.0)
Monocytes Relative: 7 %
Neutro Abs: 10.8 10*3/uL — ABNORMAL HIGH (ref 1.7–7.7)
Neutrophils Relative %: 79 %
Platelets: 432 10*3/uL — ABNORMAL HIGH (ref 150–400)
RBC: 4.5 MIL/uL (ref 3.87–5.11)
RDW: 12.2 % (ref 11.5–15.5)
WBC: 13.7 10*3/uL — ABNORMAL HIGH (ref 4.0–10.5)
nRBC: 0 % (ref 0.0–0.2)

## 2019-06-22 LAB — I-STAT BETA HCG BLOOD, ED (MC, WL, AP ONLY): I-stat hCG, quantitative: <5 m[IU]/mL (ref <5)

## 2019-06-22 LAB — CBG MONITORING, ED: Glucose-Capillary: 374 mg/dL — ABNORMAL HIGH (ref 70–99)

## 2019-06-22 LAB — COMPREHENSIVE METABOLIC PANEL
ALT: 32 U/L (ref 0–44)
AST: 23 U/L (ref 15–41)
Albumin: 2.6 g/dL — ABNORMAL LOW (ref 3.5–5.0)
Alkaline Phosphatase: 97 U/L (ref 38–126)
Anion gap: 15 (ref 5–15)
BUN: 6 mg/dL (ref 6–20)
CO2: 24 mmol/L (ref 22–32)
Calcium: 9.1 mg/dL (ref 8.9–10.3)
Chloride: 94 mmol/L — ABNORMAL LOW (ref 98–111)
Creatinine, Ser: 0.89 mg/dL (ref 0.44–1.00)
GFR calc Af Amer: >60 mL/min (ref >60)
GFR calc non Af Amer: >60 mL/min (ref >60)
Glucose, Bld: 407 mg/dL — ABNORMAL HIGH (ref 70–99)
Potassium: 3.5 mmol/L (ref 3.5–5.1)
Sodium: 133 mmol/L — ABNORMAL LOW (ref 135–145)
Total Bilirubin: 0.8 mg/dL (ref 0.3–1.2)
Total Protein: 7.5 g/dL (ref 6.5–8.1)

## 2019-06-22 LAB — LACTIC ACID, PLASMA: Lactic Acid, Venous: 1.9 mmol/L (ref 0.5–1.9)

## 2019-06-22 MED ORDER — IOHEXOL 300 MG/ML  SOLN
100.0000 mL | Freq: Once | INTRAMUSCULAR | Status: AC | PRN
  Administered 2019-06-22: 22:00:00 100 mL via INTRAVENOUS

## 2019-06-22 MED ORDER — HYDROCODONE-ACETAMINOPHEN 5-325 MG PO TABS: 1.0000 | ORAL_TABLET | Freq: Four times a day (QID) | ORAL | 0 refills | Status: DC | PRN

## 2019-06-22 MED ORDER — ONDANSETRON HCL 4 MG/2ML IJ SOLN
4.0000 mg | Freq: Once | INTRAMUSCULAR | Status: AC
  Administered 2019-06-22: 21:00:00 4 mg via INTRAVENOUS
  Filled 2019-06-22: qty 2

## 2019-06-22 MED ORDER — VANCOMYCIN HCL IN DEXTROSE 1-5 GM/200ML-% IV SOLN
1000.0000 mg | Freq: Once | INTRAVENOUS | Status: AC
  Administered 2019-06-22: 1000 mg via INTRAVENOUS
  Filled 2019-06-22: qty 200

## 2019-06-22 MED ORDER — SODIUM CHLORIDE 0.9 % IV BOLUS
1000.0000 mL | Freq: Once | INTRAVENOUS | Status: AC
  Administered 2019-06-22: 21:00:00 1000 mL via INTRAVENOUS

## 2019-06-22 MED ORDER — DOXYCYCLINE HYCLATE 100 MG PO CAPS: 100.0000 mg | ORAL_CAPSULE | Freq: Two times a day (BID) | ORAL | 0 refills | Status: DC

## 2019-06-22 MED ORDER — MORPHINE SULFATE (PF) 4 MG/ML IV SOLN
4.0000 mg | Freq: Once | INTRAVENOUS | Status: AC
  Administered 2019-06-22: 21:00:00 4 mg via INTRAVENOUS
  Filled 2019-06-22: qty 1

## 2019-06-22 NOTE — ED Provider Notes (Signed)
[facility]    CSN: [id number] Arrival date & time: 06/22/19  1557      History   Chief Complaint Chief Complaint  Patient presents with  . Abscess    HPI Shelly Shields is a 39 y.o. female with history of obesity, hypertension, sleep apnea, diabetes presenting for pain in wound to left gluteus.  States she noticed pain and swelling to the area about a week ago which is progressively gotten worse.  Patient also notes fever, though has attributed this to her being Covid positive (1/15 via [facility]).  Denies arthralgias, myalgias, chest pain, difficulty breathing.  No change in bladder or bowel habit.  Has tried putting paper towels with warm water to help ease pain.  Past Medical History:  Diagnosis Date  . Asthma   . Diabetes mellitus without complication ([place])   . Hypertension   . Migraines   . Obesity   . Sleep apnea     Patient Active Problem List   Diagnosis Date Noted  . Hypernatremia   . Asthma, chronic   . DKA (diabetic ketoacidoses) ([place]) 11/27/2014  . Diabetes mellitus, new onset ([place])   . Hyperglycemic hyperosmolar nonketotic coma ([place])   . Essential hypertension   . Asthma     Past Surgical History:  Procedure Laterality Date  . BILATERAL CARPAL TUNNEL RELEASE      OB History   No obstetric history on file.      Home Medications    Prior to Admission medications   Medication Sig Start Date End Date Taking? Authorizing Provider  fluticasone (FLOVENT HFA) 110 MCG/ACT inhaler Inhale into the lungs 2 (two) times daily.   Yes [provider]  insulin lispro protamine-lispro (HUMALOG 75/25 MIX) (75-25) 100 UNIT/ML SUSP injection Inject into the skin.   Yes [provider]  omeprazole (PRILOSEC) 40 MG capsule Take 40 mg by mouth daily.   Yes [provider]  topiramate (TOPAMAX) 200 MG tablet Take 200 mg by mouth daily.   Yes [provider]  valsartan (DIOVAN) 160 MG tablet Take 160 mg by mouth  daily.   Yes [provider]  VENTOLIN HFA 108 (90 BASE) MCG/ACT inhaler Use as directed 2 puffs in the mouth or throat 4 (four) times daily as needed. 12/21/14  Yes [provider]  vitamin B-12 (CYANOCOBALAMIN) 500 MCG tablet Take 500 mcg by mouth daily.   Yes [provider]  Blood Glucose Monitoring Suppl (ACCU-CHEK NANO SMARTVIEW) W/DEVICE KIT 1 Device by Does not apply route 4 (four) times daily - after meals and at bedtime. 11/29/14   Frazier Richards, MD  canagliflozin (INVOKANA) 300 MG TABS tablet Take 300 mg by mouth daily before breakfast.    [provider]  clotrimazole (LOTRIMIN) 1 % cream Apply to affected area 2 times daily 06/16/16   Everlene Balls, MD  doxycycline (VIBRAMYCIN) 100 MG capsule Take 1 capsule (100 mg total) by mouth 2 (two) times daily. 06/22/19   Joy, Shawn C, PA-C  escitalopram (LEXAPRO) 10 MG tablet Take 10 mg by mouth daily.    [provider]  fexofenadine (ALLEGRA) 180 MG tablet Take 180 mg by mouth daily.    [provider]  fluticasone (FLONASE) 50 MCG/ACT nasal spray Place 2 sprays into both nostrils daily as needed for allergies.  09/22/14   [provider]  glucose blood (ACCU-CHEK SMARTVIEW) test strip Check sugar 6 x daily Patient taking differently: Check sugar 6 x daily. Uses  true test instead 11/29/14   Frazier Richards, MD  HYDROcodone-acetaminophen (NORCO/VICODIN) 5-325 MG tablet Take 1-2 tablets by mouth every 6 (six) hours as needed for severe pain. 06/22/19   Joy, Shawn C, PA-C  Insulin Glargine (LANTUS) 100 UNIT/ML Solostar Pen Inject 35 Units into the skin daily. 11/29/14   Frazier Richards, MD  Insulin Pen Needle (INSUPEN PEN NEEDLES) 32G X 4 MM MISC BD Pen Needles- brand specific. Inject insulin via insulin pen daily 11/29/14   Frazier Richards, MD  ketotifen (ZADITOR) 0.025 % ophthalmic solution Place 1 drop into both eyes daily as needed (dry eyes).    [provider]  Lancet Devices [facility]) lancets Use as instructed for blood glucose checks four times daily, before meals and at bedtime 11/29/14   Frazier Richards, MD  losartan (COZAAR) 25 MG tablet Take 25 mg by mouth daily.    [provider]  medroxyPROGESTERone (DEPO-PROVERA) 150 MG/ML injection Inject 150 mg into the muscle every 3 (three) months. 09/26/14   [provider]  metFORMIN (GLUCOPHAGE-XR) 500 MG 24 hr tablet TK 2 TS PO DAILY 12/20/14   [provider]  ondansetron (ZOFRAN ODT) 4 MG disintegrating tablet Take 1 tablet (4 mg total) by mouth every 8 (eight) hours as needed for nausea or vomiting. 01/29/16   Charlann Lange, PA-C  ondansetron (ZOFRAN) 4 MG tablet Take 1 tablet (4 mg total) by mouth every 6 (six) hours. 01/13/16   Isla Pence, MD  promethazine (PHENERGAN) 25 MG tablet Take 25 mg by mouth every 6 (six) hours as needed for nausea or vomiting.    [provider]  pseudoephedrine (SUDAFED) 120 MG 12 hr tablet Take 120 mg by mouth daily as needed for congestion.    [provider]  SUMAtriptan (IMITREX) 100 MG tablet Take 1 tablet (100 mg total) by mouth every 2 (two) hours as needed for migraine or headache. 11/29/14   Frazier Richards, MD  traMADol (ULTRAM) 50 MG tablet Take 1 tablet (50 mg total) by mouth every 6 (six) hours as needed. 09/21/17   Domenic Moras, PA-C  TRUEPLUS LANCETS 33G MISC USE TO CHECK BLOOD GLUCOSE QID - AFTER MEALS AND AT BEDTIME 12/22/14   [provider]    Family History Family History  Problem Relation Age of Onset  . Hypertension Mother   . Thyroid disease Mother   . Hypertension Father   . Stroke Father   . Diabetes Father   . Sleep apnea Father     Social History Social History   Tobacco Use  . Smoking status: Never Smoker  . Smokeless tobacco: Never Used  Substance Use Topics  . Alcohol use: Yes    Alcohol/week: 0.0 standard drinks    Comment: occasional   . Drug use: No     Allergies   Ibuprofen and  Ketorolac   Review of Systems As per HPI   Physical Exam Triage Vital Signs ED Triage Vitals [06/22/19 1702]  Enc Vitals Group     BP 119/84     Pulse Rate 97     Resp 20     Temp 98.4 F (36.9 C)     Temp Source Temporal     SpO2 94 %     Weight      Height      Head Circumference      Peak Flow      Pain Score 8     Pain Loc  Pain Edu?      Excl. in [place]?    No data found.  Updated Vital Signs BP 119/84 (BP Location: Right Arm)   Pulse 97   Temp 98.4 F (36.9 C) (Temporal)   Resp 20   SpO2 94%   Visual Acuity Right Eye Distance:   Left Eye Distance:   Bilateral Distance:    Right Eye Near:   Left Eye Near:    Bilateral Near:     Physical Exam Constitutional:      General: She is not in acute distress.    Appearance: She is obese. She is not toxic-appearing or diaphoretic.  HENT:     Head: Normocephalic and atraumatic.  Eyes:     General: No scleral icterus.    Pupils: Pupils are equal, round, and reactive to light.  Cardiovascular:     Rate and Rhythm: Normal rate.  Pulmonary:     Effort: Pulmonary effort is normal. No respiratory distress.     Breath sounds: No wheezing.  Skin:    Coloration: Skin is not jaundiced or pale.     Comments: 2 cm circumferential area of former abscess with surrounding induration and exquisite tenderness no erythema.  Skin over lesion appears thin OR could be crusting of skin/pulp-like or paper towel material overlying a partial-thickness wound/ulceration  Neurological:     Mental Status: She is alert and oriented to person, place, and time.      UC Treatments / Results  Labs (all labs ordered are listed, but only abnormal results are displayed) Labs Reviewed - No data to display  EKG   Radiology No results found.  Procedures Procedures (including critical care time)  Medications Ordered in UC Medications - No data to display  Initial Impression / Assessment and Plan / UC Course  I have reviewed  the triage vital signs and the nursing notes.  Pertinent labs & imaging results that were available during my care of the patient were reviewed by me and considered in my medical decision making (see chart for details).     Patient afebrile, nontoxic in office today.  H&P consistent with gluteal abscess that appears to self drained, though circumferential lesion itself is concerning for thin skin that is status post drainage versus a crusting of pulp from paper towel and granulation tissue that may be overlying a partial thickness ulceration second abscess.  Given comorbidities, exquisite pain with gluteal lesion and likely surrounding cellulitis patient was referred to ER for possible surgical debridement patient electing to self transfer in stable condition. Final Clinical Impressions(s) / UC Diagnoses   Final diagnoses:  Abscess, gluteal, left  Diabetic ulcer of left buttock [facility])     Discharge Instructions     Recommend you go to ER for further evaluation of your gluteal ulceration/open wound and abscess    ED Prescriptions    None     PDMP not reviewed this encounter.   [place], [place], [place] 06/27/19 [phone]

## 2019-06-22 NOTE — ED Triage Notes (Signed)
Pt sent here from UC for further evaluation of abscess to right buttock for the past week. Recently tested positive for COVID. Hx of Diabetes. Reports drainage to the area. Pt a.o, resp e.u

## 2019-06-22 NOTE — Discharge Instructions (Addendum)
Wound Care - Abscess  You may remove the bandage after 24 hours.  The only reason to replace the bandage is to protect clothing from drainage. Bandages, if used, should be replaced daily or whenever soiled. The wound may continue to drain for the next 2-3 days.   Cleaning: Clean the wound and surrounding area gently with tap water and mild soap. Rinse well and blot dry. You may shower normally. Soaking the wound in Epsom salt baths for no more than 15 minutes once a day may help rinse out any remaining pus and help with wound healing.  Clean the wound daily to prevent further infection. Do not use cleaners such as hydrogen peroxide or alcohol.   Scar reduction: Application of a topical antibiotic ointment, such as Neosporin, after the wound has begun to close and heal well can decrease scab formation and reduce scarring. After the wound has healed, application of ointments such as Aquaphor can also reduce scar formation.  The key to scar reduction is keeping the skin well hydrated and supple. Drinking plenty of water throughout the day (At least eight 8oz glasses of water a day) is essential to staying well hydrated.  Pain: You may use Tylenol for pain. Vicodin: May take Vicodin (hydrocodone-acetaminophen) as needed for severe pain.   Do not drive or perform other dangerous activities while taking this medication as it can cause drowsiness as well as changes in reaction time and judgement.   Please note that each pill of Vicodin contains 325 mg of acetaminophen (generic for Tylenol) and the above dosage limits apply.  Prevention: There is some people that have a predisposition to abscess formation, however, there are some things that can be done to prevent abscesses in many people.  Most abscesses form because bacteria that naturally lives on the skin gets trapped underneath the skin.  This can occur through openings too small to see. Before and after any area of skin is shaved, wax, or abraded in  any manner, the area should be washed with soap and water and rinsed well.   If you are having trouble with recurrent abscesses, it may be wise to perform a chlorhexidine wash regimen.  For 1 week, wash all of your body with chlorhexidine (available over-the-counter at most pharmacies). You may also need to reevaluate your use of daily soap as soaps with perfumes or dyes can increase the chances of infection in some people.  Follow up: Please return to the ED or go to your primary care provider in 2-3 days for a wound check to assure proper healing.  Return: Return to the ED sooner should signs of worsening infection arise, such as spreading redness, worsening puffiness/swelling, severe increase in pain, fever over 100.32F, or any other major issues.  For prescription assistance, may try using prescription discount sites or apps, such as goodrx.com

## 2019-06-22 NOTE — ED Notes (Signed)
Patient ambulates independently.  Sent to the ER for further evaluation of abscess to buttocks/rectal area.

## 2019-06-22 NOTE — ED Triage Notes (Signed)
Pt presents to [facility] for assessment of an abscess to her right buttocks.  States she was seen for COVID last week and was told it was a yeast infection, treated with diflucan.

## 2019-06-22 NOTE — Discharge Instructions (Addendum)
Recommend you go to ER for further evaluation of your gluteal ulceration/open wound and abscess

## 2019-06-22 NOTE — ED Provider Notes (Signed)
[facility] EMERGENCY DEPARTMENT Provider Note   CSN: [id number] Arrival date & time: 06/22/19  1842     History Chief Complaint  Patient presents with  . Abscess    Shelly Shields is a 39 y.o. female.  HPI    Shelly Shields is a 39 y.o. female, with a history of asthma, DM, HTN, obesity, presenting to the ED with concern for abscess to the right gluteal region of the buttocks.  She first noted pain and swelling to the area 1 week ago.  Pain and swelling worsened during that time.  Last night, she began to have drainage from the region. She has had regular fever over the past week, but also notes she tested positive for COVID-19 on January 15 (confirmed in [place] under [facility]).  She has been controlling her fever with Tylenol. The last couple days she has noted right-sided abdominal pain, but none currently. She has no known history of MRSA infection. Denies N/V/D, changes in bowel movements, chest pain, shortness of breath, or any other complaints.   Past Medical History:  Diagnosis Date  . Asthma   . Diabetes mellitus without complication ([place])   . Hypertension   . Migraines   . Obesity   . Sleep apnea     Patient Active Problem List   Diagnosis Date Noted  . Hypernatremia   . Asthma, chronic   . DKA (diabetic ketoacidoses) ([place]) 11/27/2014  . Diabetes mellitus, new onset ([place])   . Hyperglycemic hyperosmolar nonketotic coma ([place])   . Essential hypertension   . Asthma     Past Surgical History:  Procedure Laterality Date  . BILATERAL CARPAL TUNNEL RELEASE       OB History   No obstetric history on file.     Family History  Problem Relation Age of Onset  . Hypertension Mother   . Thyroid disease Mother   . Hypertension Father   . Stroke Father   . Diabetes Father   . Sleep apnea Father     Social History   Tobacco Use  . Smoking status: Never Smoker  . Smokeless tobacco: Never Used  Substance Use  Topics  . Alcohol use: Yes    Alcohol/week: 0.0 standard drinks    Comment: occasional   . Drug use: No    Home Medications Prior to Admission medications   Medication Sig Start Date End Date Taking? Authorizing Provider  Blood Glucose Monitoring Suppl (ACCU-CHEK NANO SMARTVIEW) W/DEVICE KIT 1 Device by Does not apply route 4 (four) times daily - after meals and at bedtime. 11/29/14   Frazier Richards, MD  canagliflozin (INVOKANA) 300 MG TABS tablet Take 300 mg by mouth daily before breakfast.    [provider]  clotrimazole (LOTRIMIN) 1 % cream Apply to affected area 2 times daily 06/16/16   Everlene Balls, MD  doxycycline (VIBRAMYCIN) 100 MG capsule Take 1 capsule (100 mg total) by mouth 2 (two) times daily. 06/22/19   ,  C, PA-C  escitalopram (LEXAPRO) 10 MG tablet Take 10 mg by mouth daily.    [provider]  fexofenadine (ALLEGRA) 180 MG tablet Take 180 mg by mouth daily.    [provider]  fluticasone (FLONASE) 50 MCG/ACT nasal spray Place 2 sprays into both nostrils daily as needed for allergies.  09/22/14   [provider]  fluticasone (FLOVENT HFA) 110 MCG/ACT inhaler Inhale into the lungs 2 (two) times daily.    [provider]  glucose blood (ACCU-CHEK SMARTVIEW) test strip Check sugar 6 x daily Patient taking differently: Check sugar 6 x daily. Uses true test instead 11/29/14   Frazier Richards, MD  HYDROcodone-acetaminophen (NORCO/VICODIN) 5-325 MG tablet Take 1-2 tablets by mouth every 6 (six) hours as needed for severe pain. 06/22/19   ,  C, PA-C  Insulin Glargine (LANTUS) 100 UNIT/ML Solostar Pen Inject 35 Units into the skin daily. 11/29/14   Frazier Richards, MD  insulin lispro protamine-lispro (HUMALOG 75/25 MIX) (75-25) 100 UNIT/ML SUSP injection Inject into the skin.    [provider]  Insulin Pen Needle (INSUPEN PEN NEEDLES) 32G X 4 MM MISC BD Pen Needles- brand specific. Inject insulin via insulin pen daily 11/29/14    Frazier Richards, MD  ketotifen (ZADITOR) 0.025 % ophthalmic solution Place 1 drop into both eyes daily as needed (dry eyes).    [provider]  Lancet Devices [facility]) lancets Use as instructed for blood glucose checks four times daily, before meals and at bedtime 11/29/14   Frazier Richards, MD  losartan (COZAAR) 25 MG tablet Take 25 mg by mouth daily.    [provider]  medroxyPROGESTERone (DEPO-PROVERA) 150 MG/ML injection Inject 150 mg into the muscle every 3 (three) months. 09/26/14   [provider]  metFORMIN (GLUCOPHAGE-XR) 500 MG 24 hr tablet TK 2 TS PO DAILY 12/20/14   [provider]  omeprazole (PRILOSEC) 40 MG capsule Take 40 mg by mouth daily.    [provider]  ondansetron (ZOFRAN ODT) 4 MG disintegrating tablet Take 1 tablet (4 mg total) by mouth every 8 (eight) hours as needed for nausea or vomiting. 01/29/16   Charlann Lange, PA-C  ondansetron (ZOFRAN) 4 MG tablet Take 1 tablet (4 mg total) by mouth every 6 (six) hours. 01/13/16   Isla Pence, MD  promethazine (PHENERGAN) 25 MG tablet Take 25 mg by mouth every 6 (six) hours as needed for nausea or vomiting.    [provider]  pseudoephedrine (SUDAFED) 120 MG 12 hr tablet Take 120 mg by mouth daily as needed for congestion.    [provider]  SUMAtriptan (IMITREX) 100 MG tablet Take 1 tablet (100 mg total) by mouth every 2 (two) hours as needed for migraine or headache. 11/29/14   Frazier Richards, MD  topiramate (TOPAMAX) 200 MG tablet Take 200 mg by mouth daily.    [provider]  traMADol (ULTRAM) 50 MG tablet Take 1 tablet (50 mg total) by mouth every 6 (six) hours as needed. 09/21/17   Domenic Moras, PA-C  TRUEPLUS LANCETS 33G MISC USE TO CHECK BLOOD GLUCOSE QID - AFTER MEALS AND AT BEDTIME 12/22/14   [provider]  valsartan (DIOVAN) 160 MG tablet Take 160 mg by mouth daily.    [provider]  VENTOLIN HFA 108 (90 BASE) MCG/ACT  inhaler Use as directed 2 puffs in the mouth or throat 4 (four) times daily as needed. 12/21/14   [provider]  vitamin B-12 (CYANOCOBALAMIN) 500 MCG tablet Take 500 mcg by mouth daily.    [provider]    Allergies    Ibuprofen and Ketorolac  Review of Systems   Review of Systems  Constitutional: Positive for fever.  Respiratory: Negative for cough and shortness of breath.   Cardiovascular: Negative for chest pain and leg swelling.  Gastrointestinal: Positive for abdominal pain (None currently). Negative for diarrhea, nausea and vomiting.  Skin: Positive for color change and  wound.  Neurological: Negative for weakness and numbness.  All other systems reviewed and are negative.   Physical Exam Updated Vital Signs BP (!) 141/88   Pulse (!) 103   Temp 98 F (36.7 C) (Oral)   Resp 18   SpO2 99%   Physical Exam Vitals and nursing note reviewed.  Constitutional:      General: She is not in acute distress.    Appearance: She is well-developed. She is not diaphoretic.  HENT:     Head: Normocephalic and atraumatic.     Mouth/Throat:     Mouth: Mucous membranes are moist.     Pharynx: Oropharynx is clear.  Eyes:     Conjunctiva/sclera: Conjunctivae normal.  Cardiovascular:     Rate and Rhythm: Normal rate and regular rhythm.     Pulses: Normal pulses.          Radial pulses are 2+ on the right side and 2+ on the left side.       Posterior tibial pulses are 2+ on the right side and 2+ on the left side.     Heart sounds: Normal heart sounds.     Comments: Tactile temperature in the extremities appropriate and equal bilaterally. Pulmonary:     Effort: Pulmonary effort is normal. No respiratory distress.     Breath sounds: Normal breath sounds.  Abdominal:     Palpations: Abdomen is soft.     Tenderness: There is no abdominal tenderness. There is no guarding.  Musculoskeletal:     Cervical back: Neck supple.     Right lower leg: No edema.     Left lower  leg: No edema.  Lymphadenopathy:     Cervical: No cervical adenopathy.  Skin:    General: Skin is warm and dry.     Comments: Area of thinned skin and likely the site of previous fluctuance about the size of a half dollar to the right buttock.  Exquisite tenderness in this region.  Small opening with purulent drainage. Induration, erythema, and tenderness greater than the diameter of a softball surrounding the above-mentioned wound. No tenderness, erythema, wounds, or increased warmth to the left of midline.  Neurological:     Mental Status: She is alert.  Psychiatric:        Mood and Affect: Mood and affect normal.        Speech: Speech normal.        Behavior: Behavior normal.            ED Results / Procedures / Treatments   Labs (all labs ordered are listed, but only abnormal results are displayed) Labs Reviewed  COMPREHENSIVE METABOLIC PANEL - Abnormal; Notable for the following components:      Result Value   Sodium 133 (*)    Chloride 94 (*)    Glucose, Bld 407 (*)    Albumin 2.6 (*)    All other components within normal limits  CBC WITH DIFFERENTIAL/PLATELET - Abnormal; Notable for the following components:   WBC 13.7 (*)    Platelets 432 (*)    Neutro Abs 10.8 (*)    Abs Immature Granulocytes 0.16 (*)    All other components within normal limits  CBG MONITORING, ED - Abnormal; Notable for the following components:   Glucose-Capillary 374 (*)    All other components within normal limits  MRSA PCR SCREENING  LACTIC ACID, PLASMA  LACTIC ACID, PLASMA  I-STAT BETA HCG BLOOD, ED ([facility], [facility], [facility] ONLY)    EKG  None  Radiology CT ABDOMEN PELVIS W CONTRAST  Result Date: 06/22/2019 CLINICAL DATA:  Abscess to right buttock with drainage. EXAM: CT ABDOMEN AND PELVIS WITH CONTRAST TECHNIQUE: Multidetector CT imaging of the abdomen and pelvis was performed using the standard protocol following bolus administration of intravenous contrast. CONTRAST:  16m OMNIPAQUE  IOHEXOL 300 MG/ML  SOLN COMPARISON:  January 12, 2016 FINDINGS: Lower chest: Mild to moderate severity patchy infiltrates are seen throughout both lung bases. Hepatobiliary: No focal liver abnormality is seen. No gallstones, gallbladder wall thickening, or biliary dilatation. Pancreas: Unremarkable. No pancreatic ductal dilatation or surrounding inflammatory changes. Spleen: Normal in size without focal abnormality. Adrenals/Urinary Tract: Adrenal glands are unremarkable. Kidneys are normal, without renal calculi, focal lesion, or hydronephrosis. Bladder is unremarkable. Stomach/Bowel: Stomach is within normal limits. Appendix appears normal. No evidence of bowel wall thickening, distention, or inflammatory changes. Vascular/Lymphatic: Reproductive: Uterus and bilateral adnexa are unremarkable. Other: No abdominal wall hernia or abnormality. No abdominopelvic ascites. Musculoskeletal: Mild diffuse inflammatory fat stranding is seen along the gluteal region on the right. There is no evidence of associated fluid collection or abscess. No acute or significant osseous findings. IMPRESSION: 1. Mild inflammation involving the gluteal region on the right, without visualization of an associated fluid collection or abscess. 2. Mild to moderate severity patchy bibasilar infiltrates. Electronically Signed   By: TVirgina NorfolkM.D.   On: 06/22/2019 22:40    Procedures Ultrasound ED Soft Tissue  Date/Time: 06/22/2019 8:50 PM Performed by: JLorayne Bender PA-C Authorized by: JLorayne Bender PA-C   Procedure details:    Indications: localization of abscess and evaluate for cellulitis     Transverse view:  Visualized   Longitudinal view:  Visualized   Images: archived     Limitations:  Body habitus (Pain) Location:    Location: buttocks     Side:  Right Findings:     abscess present (questionable)    cellulitis present   (including critical care time)  Medications Ordered in ED Medications  sodium chloride  0.9 % bolus 1,000 mL (0 mLs Intravenous Stopped 06/22/19 2243)  morphine 4 MG/ML injection 4 mg (4 mg Intravenous Given 06/22/19 2110)  ondansetron (ZOFRAN) injection 4 mg (4 mg Intravenous Given 06/22/19 2110)  vancomycin (VANCOCIN) IVPB 1000 mg/200 mL premix (0 mg Intravenous Stopped 06/22/19 2243)  iohexol (OMNIPAQUE) 300 MG/ML solution 100 mL (100 mLs Intravenous Contrast Given 06/22/19 2223)    ED Course  I have reviewed the triage vital signs and the nursing notes.  Pertinent labs & imaging results that were available during my care of the patient were reviewed by me and considered in my medical decision making (see chart for details).    MDM Rules/Calculators/A&P                      Patient presents with painful, draining wound to the right buttock.  Patient is nontoxic appearing, afebrile, not tachycardic, not tachypneic, not hypotensive, maintains excellent SPO2 on room air, and is in no apparent distress.  Mild leukocytosis. Suspect patient had abscess that began to spontaneously drain.  She does have evidence of surrounding cellulitis.   There was a question of remaining abscess during bedside ultrasound examination, therefore CT was obtained.  CT findings consistent with cellulitis without fluid collection. She will need regular wound checks through her PCP and may actually need further management through general surgery.  This information was discussed with the patient. The patient was also given instructions for  home care as well as return precautions. Patient voices understanding of these instructions, accepts the plan, and is comfortable with discharge.   Findings and plan of care discussed with Madalyn Rob, MD. Dr. Roslynn Amble personally evaluated and examined this patient.  Vitals:   06/22/19 2130 06/22/19 2239 06/22/19 2300 06/22/19 2315  BP: 124/81 140/85 (!) 124/92 (!) 140/97  Pulse: 89 96 92 96  Resp:  16  16  Temp:      TempSrc:      SpO2: 95% 97% 96% 97%  Weight:   92.5 kg    Height:  5' 1" (1.549 m)       Final Clinical Impression(s) / ED Diagnoses Final diagnoses:  Cellulitis of buttock    Rx / DC Orders ED Discharge Orders         Ordered    doxycycline (VIBRAMYCIN) 100 MG capsule  2 times daily     06/22/19 2335    HYDROcodone-acetaminophen (NORCO/VICODIN) 5-325 MG tablet  Every 6 hours PRN     06/22/19 2335           Lorayne Bender, PA-C 06/22/19 2350    Lucrezia Starch, MD 06/23/19 [phone]

## 2019-06-29 ENCOUNTER — Other Ambulatory Visit: Payer: Self-pay

## 2019-06-29 ENCOUNTER — Emergency Department (HOSPITAL_COMMUNITY): Payer: 59

## 2019-06-29 ENCOUNTER — Encounter (HOSPITAL_COMMUNITY): Payer: Self-pay | Admitting: Emergency Medicine

## 2019-06-29 ENCOUNTER — Inpatient Hospital Stay (HOSPITAL_COMMUNITY)
Admission: EM | Admit: 2019-06-29 | Discharge: 2019-07-03 | DRG: 177 | Disposition: A | Discharge status: Home / Self Care | Payer: 59 | Attending: Internal Medicine | Admitting: Internal Medicine

## 2019-06-29 DIAGNOSIS — Z794 Long term (current) use of insulin: Secondary | ICD-10-CM

## 2019-06-29 DIAGNOSIS — J45909 Unspecified asthma, uncomplicated: Secondary | ICD-10-CM | POA: Diagnosis present

## 2019-06-29 DIAGNOSIS — E669 Obesity, unspecified: Secondary | ICD-10-CM | POA: Diagnosis present

## 2019-06-29 DIAGNOSIS — I10 Essential (primary) hypertension: Secondary | ICD-10-CM | POA: Diagnosis present

## 2019-06-29 DIAGNOSIS — I2699 Other pulmonary embolism without acute cor pulmonale: Secondary | ICD-10-CM | POA: Diagnosis not present

## 2019-06-29 DIAGNOSIS — E1165 Type 2 diabetes mellitus with hyperglycemia: Secondary | ICD-10-CM | POA: Diagnosis present

## 2019-06-29 DIAGNOSIS — J1282 Pneumonia due to coronavirus disease 2019: Secondary | ICD-10-CM | POA: Diagnosis present

## 2019-06-29 DIAGNOSIS — Z7951 Long term (current) use of inhaled steroids: Secondary | ICD-10-CM

## 2019-06-29 DIAGNOSIS — Z823 Family history of stroke: Secondary | ICD-10-CM

## 2019-06-29 DIAGNOSIS — G43909 Migraine, unspecified, not intractable, without status migrainosus: Secondary | ICD-10-CM | POA: Diagnosis present

## 2019-06-29 DIAGNOSIS — U071 COVID-19: Secondary | ICD-10-CM | POA: Diagnosis not present

## 2019-06-29 DIAGNOSIS — Z8349 Family history of other endocrine, nutritional and metabolic diseases: Secondary | ICD-10-CM

## 2019-06-29 DIAGNOSIS — J9601 Acute respiratory failure with hypoxia: Secondary | ICD-10-CM | POA: Diagnosis present

## 2019-06-29 DIAGNOSIS — R0602 Shortness of breath: Secondary | ICD-10-CM

## 2019-06-29 DIAGNOSIS — Z86718 Personal history of other venous thrombosis and embolism: Secondary | ICD-10-CM

## 2019-06-29 DIAGNOSIS — I2693 Single subsegmental pulmonary embolism without acute cor pulmonale: Secondary | ICD-10-CM | POA: Diagnosis present

## 2019-06-29 DIAGNOSIS — G4733 Obstructive sleep apnea (adult) (pediatric): Secondary | ICD-10-CM | POA: Diagnosis present

## 2019-06-29 DIAGNOSIS — Z833 Family history of diabetes mellitus: Secondary | ICD-10-CM

## 2019-06-29 DIAGNOSIS — Z79891 Long term (current) use of opiate analgesic: Secondary | ICD-10-CM

## 2019-06-29 DIAGNOSIS — G4730 Sleep apnea, unspecified: Secondary | ICD-10-CM | POA: Diagnosis present

## 2019-06-29 DIAGNOSIS — Z79899 Other long term (current) drug therapy: Secondary | ICD-10-CM

## 2019-06-29 DIAGNOSIS — Z8249 Family history of ischemic heart disease and other diseases of the circulatory system: Secondary | ICD-10-CM

## 2019-06-29 LAB — CBC
HCT: 38.6 % (ref 36.0–46.0)
Hemoglobin: 12.5 g/dL (ref 12.0–15.0)
MCH: 29.1 pg (ref 26.0–34.0)
MCHC: 32.4 g/dL (ref 30.0–36.0)
MCV: 89.8 fL (ref 80.0–100.0)
Platelets: 367 10*3/uL (ref 150–400)
RBC: 4.3 MIL/uL (ref 3.87–5.11)
RDW: 13.1 % (ref 11.5–15.5)
WBC: 13.2 10*3/uL — ABNORMAL HIGH (ref 4.0–10.5)
nRBC: 0 % (ref 0.0–0.2)

## 2019-06-29 LAB — BASIC METABOLIC PANEL
Anion gap: 15 (ref 5–15)
BUN: 6 mg/dL (ref 6–20)
CO2: 25 mmol/L (ref 22–32)
Calcium: 8.7 mg/dL — ABNORMAL LOW (ref 8.9–10.3)
Chloride: 93 mmol/L — ABNORMAL LOW (ref 98–111)
Creatinine, Ser: 0.81 mg/dL (ref 0.44–1.00)
GFR calc Af Amer: >60 mL/min (ref >60)
GFR calc non Af Amer: >60 mL/min (ref >60)
Glucose, Bld: 463 mg/dL — ABNORMAL HIGH (ref 70–99)
Potassium: 3.6 mmol/L (ref 3.5–5.1)
Sodium: 133 mmol/L — ABNORMAL LOW (ref 135–145)

## 2019-06-29 LAB — I-STAT BETA HCG BLOOD, ED (MC, WL, AP ONLY): I-stat hCG, quantitative: <5 m[IU]/mL (ref <5)

## 2019-06-29 LAB — TROPONIN I (HIGH SENSITIVITY): Troponin I (High Sensitivity): 10 ng/L (ref <18)

## 2019-06-29 MED ORDER — HEPARIN (PORCINE) 25000 UT/250ML-% IV SOLN
1700.0000 [IU]/h | INTRAVENOUS | Status: AC
  Administered 2019-06-29: 1200 [IU]/h via INTRAVENOUS
  Administered 2019-06-30: 1500 [IU]/h via INTRAVENOUS
  Filled 2019-06-29 (×4): qty 250

## 2019-06-29 MED ORDER — HEPARIN BOLUS VIA INFUSION
5000.0000 [IU] | Freq: Once | INTRAVENOUS | Status: AC
  Administered 2019-06-29: 5000 [IU] via INTRAVENOUS
  Filled 2019-06-29: qty 5000

## 2019-06-29 MED ORDER — SODIUM CHLORIDE 0.9% FLUSH
3.0000 mL | Freq: Once | INTRAVENOUS | Status: AC
  Administered 2019-06-30: 3 mL via INTRAVENOUS

## 2019-06-29 MED ORDER — IOHEXOL 350 MG/ML SOLN
80.0000 mL | Freq: Once | INTRAVENOUS | Status: AC | PRN
  Administered 2019-06-29: 80 mL via INTRAVENOUS

## 2019-06-29 NOTE — ED Triage Notes (Signed)
C/o SOB and cough x 1 week.  States she has pain to center of chest only with coughing.  COVID + on 1/15.

## 2019-06-29 NOTE — ED Provider Notes (Signed)
[facility] EMERGENCY DEPARTMENT Provider Note   CSN: [id number] Arrival date & time: 06/29/19  1725     History Chief Complaint  Patient presents with  . COVID +  . Shortness of Breath  . Cough    Shelly Shields is a 39 y.o. female.  39 y.o female with a PMH of DM, HTN, Migraines presents to the ED with a chief complaint of shortness of breath x 18 days. Patient Was diagnosed with COVID-19 on June 11, 2019.  She reports since then she has been experiencing a cough, this was first dry but now is wet with some clear sputum.  She reports in the past couple of days she has felt that the cough has worsened along with the shortness of breath.  Reports posttussive emesis, according to PCPs note yesterday patient was 91% on room air.  She does report a cough along with the shortness of breath are worse with exertion.  She has been fever free for the past 4 days.  Patient also endorses anorexia, reports she is lost 16 pounds during this viral infection.  She does have a prior history of blood clots along with CAD in her family.  No chest pain, fever, abdominal pain or urinary symptoms.     The history is provided by the patient and medical records.  Shortness of Breath Associated symptoms: cough and vomiting   Associated symptoms: no abdominal pain, no fever, no headaches and no sore throat   Cough Associated symptoms: shortness of breath   Associated symptoms: no fever, no headaches and no sore throat        Past Medical History:  Diagnosis Date  . Asthma   . Diabetes mellitus without complication ([place])   . Hypertension   . Migraines   . Obesity   . Sleep apnea     Patient Active Problem List   Diagnosis Date Noted  . Hypernatremia   . Asthma, chronic   . DKA (diabetic ketoacidoses) ([place]) 11/27/2014  . Diabetes mellitus, new onset ([place])   . Hyperglycemic hyperosmolar nonketotic coma ([place])   . Essential hypertension   . Asthma     Past Surgical  History:  Procedure Laterality Date  . BILATERAL CARPAL TUNNEL RELEASE       OB History   No obstetric history on file.     Family History  Problem Relation Age of Onset  . Hypertension Mother   . Thyroid disease Mother   . Hypertension Father   . Stroke Father   . Diabetes Father   . Sleep apnea Father     Social History   Tobacco Use  . Smoking status: Never Smoker  . Smokeless tobacco: Never Used  Substance Use Topics  . Alcohol use: Yes    Alcohol/week: 0.0 standard drinks    Comment: occasional   . Drug use: No    Home Medications Prior to Admission medications   Medication Sig Start Date End Date Taking? Authorizing Provider  Blood Glucose Monitoring Suppl (ACCU-CHEK NANO SMARTVIEW) W/DEVICE KIT 1 Device by Does not apply route 4 (four) times daily - after meals and at bedtime. 11/29/14   Frazier Richards, MD  canagliflozin (INVOKANA) 300 MG TABS tablet Take 300 mg by mouth daily before breakfast.    [provider]  clotrimazole (LOTRIMIN) 1 % cream Apply to affected area 2 times daily 06/16/16   Everlene Balls, MD  doxycycline (VIBRAMYCIN) 100 MG capsule Take 1 capsule (100 mg total)  by mouth 2 (two) times daily. 06/22/19   Joy, Shawn C, PA-C  escitalopram (LEXAPRO) 10 MG tablet Take 10 mg by mouth daily.    [provider]  fexofenadine (ALLEGRA) 180 MG tablet Take 180 mg by mouth daily.    [provider]  fluticasone (FLONASE) 50 MCG/ACT nasal spray Place 2 sprays into both nostrils daily as needed for allergies.  09/22/14   [provider]  fluticasone (FLOVENT HFA) 110 MCG/ACT inhaler Inhale into the lungs 2 (two) times daily.    [provider]  glucose blood (ACCU-CHEK SMARTVIEW) test strip Check sugar 6 x daily Patient taking differently: Check sugar 6 x daily. Uses true test instead 11/29/14   Frazier Richards, MD  HYDROcodone-acetaminophen (NORCO/VICODIN) 5-325 MG tablet Take 1-2 tablets by mouth every 6 (six) hours as  needed for severe pain. 06/22/19   Joy, Shawn C, PA-C  Insulin Glargine (LANTUS) 100 UNIT/ML Solostar Pen Inject 35 Units into the skin daily. 11/29/14   Frazier Richards, MD  insulin lispro protamine-lispro (HUMALOG 75/25 MIX) (75-25) 100 UNIT/ML SUSP injection Inject into the skin.    [provider]  Insulin Pen Needle (INSUPEN PEN NEEDLES) 32G X 4 MM MISC BD Pen Needles- brand specific. Inject insulin via insulin pen daily 11/29/14   Frazier Richards, MD  ketotifen (ZADITOR) 0.025 % ophthalmic solution Place 1 drop into both eyes daily as needed (dry eyes).    [provider]  Lancet Devices [facility]) lancets Use as instructed for blood glucose checks four times daily, before meals and at bedtime 11/29/14   Frazier Richards, MD  losartan (COZAAR) 25 MG tablet Take 25 mg by mouth daily.    [provider]  medroxyPROGESTERone (DEPO-PROVERA) 150 MG/ML injection Inject 150 mg into the muscle every 3 (three) months. 09/26/14   [provider]  metFORMIN (GLUCOPHAGE-XR) 500 MG 24 hr tablet TK 2 TS PO DAILY 12/20/14   [provider]  omeprazole (PRILOSEC) 40 MG capsule Take 40 mg by mouth daily.    [provider]  ondansetron (ZOFRAN ODT) 4 MG disintegrating tablet Take 1 tablet (4 mg total) by mouth every 8 (eight) hours as needed for nausea or vomiting. 01/29/16   Charlann Lange, PA-C  ondansetron (ZOFRAN) 4 MG tablet Take 1 tablet (4 mg total) by mouth every 6 (six) hours. 01/13/16   Isla Pence, MD  promethazine (PHENERGAN) 25 MG tablet Take 25 mg by mouth every 6 (six) hours as needed for nausea or vomiting.    [provider]  pseudoephedrine (SUDAFED) 120 MG 12 hr tablet Take 120 mg by mouth daily as needed for congestion.    [provider]  SUMAtriptan (IMITREX) 100 MG tablet Take 1 tablet (100 mg total) by mouth every 2 (two) hours as needed for migraine or headache. 11/29/14   Frazier Richards, MD  topiramate (TOPAMAX) 200 MG  tablet Take 200 mg by mouth daily.    [provider]  traMADol (ULTRAM) 50 MG tablet Take 1 tablet (50 mg total) by mouth every 6 (six) hours as needed. 09/21/17   Domenic Moras, PA-C  TRUEPLUS LANCETS 33G MISC USE TO CHECK BLOOD GLUCOSE QID - AFTER MEALS AND AT BEDTIME 12/22/14   [provider]  valsartan (DIOVAN) 160 MG tablet Take 160 mg by mouth daily.    [provider]  VENTOLIN HFA 108 (90 BASE) MCG/ACT inhaler Use as directed 2 puffs in the mouth or throat 4 (  four) times daily as needed. 12/21/14   [provider]  vitamin B-12 (CYANOCOBALAMIN) 500 MCG tablet Take 500 mcg by mouth daily.    [provider]    Allergies    Ibuprofen and Ketorolac  Review of Systems   Review of Systems  Constitutional: Negative for fever.  HENT: Negative for sore throat.   Respiratory: Positive for cough and shortness of breath.   Gastrointestinal: Positive for nausea and vomiting. Negative for abdominal pain.  Genitourinary: Negative for flank pain.  Musculoskeletal: Negative for back pain.  Skin: Negative for pallor and wound.  Neurological: Negative for light-headedness and headaches.    Physical Exam Updated Vital Signs BP 121/74 (BP Location: Left Arm)   Pulse (!) 104   Temp 98.9 F (37.2 C) (Oral)   Resp 18   SpO2 94%   Physical Exam Vitals and nursing note reviewed.  Constitutional:      Appearance: She is well-developed. She is not ill-appearing or toxic-appearing.  HENT:     Head: Normocephalic and atraumatic.  Eyes:     Pupils: Pupils are equal, round, and reactive to light.  Cardiovascular:     Rate and Rhythm: Tachycardia present.  Pulmonary:     Effort: Pulmonary effort is normal. Tachypnea present.     Breath sounds: Examination of the right-lower field reveals decreased breath sounds. Examination of the left-lower field reveals decreased breath sounds. Decreased breath sounds present. No wheezing.  Chest:     Chest wall: No  mass or tenderness.  Abdominal:     General: Bowel sounds are normal.     Palpations: Abdomen is soft. There is no mass.     Tenderness: There is no abdominal tenderness.  Skin:    General: Skin is warm and dry.  Neurological:     Mental Status: She is alert and oriented to person, place, and time.     ED Results / Procedures / Treatments   Labs (all labs ordered are listed, but only abnormal results are displayed) Labs Reviewed  BASIC METABOLIC PANEL - Abnormal; Notable for the following components:      Result Value   Sodium 133 (*)    Chloride 93 (*)    Glucose, Bld 463 (*)    Calcium 8.7 (*)    All other components within normal limits  CBC - Abnormal; Notable for the following components:   WBC 13.2 (*)    All other components within normal limits  I-STAT BETA HCG BLOOD, ED ([facility], [facility], [facility] ONLY)  TROPONIN I (HIGH SENSITIVITY)  TROPONIN I (HIGH SENSITIVITY)    EKG EKG Interpretation  Date/Time:  Tuesday June 29 2019 17:30:37 EST Ventricular Rate:  104 PR Interval:  132 QRS Duration: 76 QT Interval:  384 QTC Calculation: 504 R Axis:   67 Text Interpretation: Sinus tachycardia Otherwise normal ECG no prior available for comparison Confirmed by Quintella Reichert [phone]) on 06/29/2019 7:57:09 PM   Radiology CT Angio Chest PE W and/or Wo Contrast  Result Date: 06/29/2019 CLINICAL DATA:  Shortness of breath EXAM: CT ANGIOGRAPHY CHEST WITH CONTRAST TECHNIQUE: Multidetector CT imaging of the chest was performed using the standard protocol during bolus administration of intravenous contrast. Multiplanar CT image reconstructions and MIPs were obtained to evaluate the vascular anatomy. CONTRAST:  33m OMNIPAQUE IOHEXOL 350 MG/ML SOLN COMPARISON:  None. FINDINGS: Cardiovascular: Contrast injection is sufficient to demonstrate satisfactory opacification of the pulmonary arteries to the segmental level.There are extensive acute bilateral pulmonary emboli involving the main left  pulmonary artery extending into the lobar, segmental, and subsegmental branches bilaterally. There are lobar, segmental, and subsegmental pulmonary emboli involving the right lower lobe. Segmental and subsegmental pulmonary emboli are noted involving the right upper lobe. There is borderline right-sided heart strain with an RV LV ratio measuring approximately 0.9. There is however mild reflux of contrast in the IVC consistent with underlying cardiac dysfunction. Heart size is enlarged. Mediastinum/Nodes: --No mediastinal or hilar lymphadenopathy. --No axillary lymphadenopathy. --No supraclavicular lymphadenopathy. --Normal thyroid gland. --The esophagus is unremarkable Lungs/Pleura: Patchy bilateral ground-glass airspace opacities are noted bilaterally. There is no pneumothorax. No large pleural effusion. The trachea is unremarkable. Upper Abdomen: No acute abnormality. Musculoskeletal: No chest wall abnormality. No acute or significant osseous findings. Review of the MIP images confirms the above findings. IMPRESSION: 1. Extensive bilateral acute pulmonary emboli as detailed above. The RV/LV ratio is borderline measuring 0.9, however there is reflux of contrast in the IVC consistent with right-sided heart strain. 2. Diffuse bilateral ground-glass airspace opacities concerning for an atypical infectious process such as viral pneumonia. These results were called by telephone at the time of interpretation on 06/29/2019 at 11:22 pm to provider [facility] , who verbally acknowledged these results. Electronically Signed   By: Constance Holster M.D.   On: 06/29/2019 23:26    Procedures .Critical Care Performed by: Janeece Fitting, PA-C Authorized by: Janeece Fitting, PA-C   Critical care provider statement:    Critical care time (minutes):  45   Critical care start time:  06/29/2019 8:30 PM   Critical care end time:  06/29/2019 9:15 PM   Critical care time was exclusive of:  Separately billable procedures and treating  other patients   Critical care was necessary to treat or prevent imminent or life-threatening deterioration of the following conditions:  Circulatory failure   Critical care was time spent personally by me on the following activities:  Blood draw for specimens, development of treatment plan with patient or surrogate, discussions with consultants, evaluation of patient's response to treatment, examination of patient, obtaining history from patient or surrogate, ordering and performing treatments and interventions, ordering and review of laboratory studies, ordering and review of radiographic studies, pulse oximetry, re-evaluation of patient's condition and review of old charts   (including critical care time)  Medications Ordered in ED Medications  sodium chloride flush (NS) 0.9 % injection 3 mL (has no administration in time range)  heparin ADULT infusion 100 units/mL (25000 units/231m sodium chloride 0.45%) (1,200 Units/hr Intravenous New Bag/Given 06/29/19 2346)  iohexol (OMNIPAQUE) 350 MG/ML injection 80 mL (80 mLs Intravenous Contrast Given 06/29/19 2304)  heparin bolus via infusion 5,000 Units (5,000 Units Intravenous Bolus from Bag 06/29/19 2347)    ED Course  I have reviewed the triage vital signs and the nursing notes.  Pertinent labs & imaging results that were available during my care of the patient were reviewed by me and considered in my medical decision making (see chart for details).    MDM Rules/Calculators/A&P   Patient on day > 15 from Covid 19 diagnosis, seen by PCP yesterday for a persistent cough after infection. She reports feeling more winded than usual, states she has been very fatigue. She was evaluated by PCP and was found to be 91% on RA, on arrival patient reports she has been more short of breath worse with ambulation, has not been able to speak in full sentence. She is tachypnea in the 20's on today's visit. She does have an abscess of the right buttock,  which she had a  negative CT. According to PCP's note she was placed on doxy for gluteal abscess.   She is not on supplemental oxygen at home, has been monitoring her pulse ox at home. HR is elevated on arrival mildly tachycardic her O2 was 94% on arrival. Some suspicion some pulmonary embolism residual from Covid 19 infection. Will obtain CT Angio chest, discussed risk and benefits with patient who is agreeable of treatment.   CBC with a mild leukocytosis, this is unchanged from previous labs from 7 days ago. Hemoglobin is stable. BMP with some hyponatremia, glucose is 463, no anion gap last A1c on record for patient was 13 not in DKA, but she does have a history of non-compliance. HCG is negative. First troponin is 10, will obtain delta one. She does report the chest pain is present with the coughing spells.    11:24 PM Spoke to Radiologist who reported patient positive for BL extensive acute Pulmonary embolism, will begin patient on heparin.   CT Angio chest showed: 1. Extensive bilateral acute pulmonary emboli as detailed above. The  RV/LV ratio is borderline measuring 0.9, however there is reflux of  contrast in the IVC consistent with right-sided heart strain.  2. Diffuse bilateral ground-glass airspace opacities concerning for  an atypical infectious process such as viral pneumonia.     Patient has been informed of results, will begin heparin along with consult hospitalist for further management and admission.   Spoke to hospitalist service who will admit patient but would like Critical care curbside consult.   12:10 AM Spoke to Dr. Oletta Darter, who reviewed patients record, reviewed pathway, admit to hospitalist, get ECHO if abnormal or patient deteriorates consult critical care.    Portions of this note were generated with Lobbyist. Dictation errors may occur despite best attempts at proofreading.  Final Clinical Impression(s) / ED Diagnoses Final diagnoses:  Shortness of breath    COVID-19 virus infection  Bilateral pulmonary embolism [facility])    Rx / DC Orders ED Discharge Orders    None       Janeece Fitting, PA-C 06/30/19 0012    Quintella Reichert, MD 07/01/19 [phone]

## 2019-06-29 NOTE — ED Notes (Signed)
Pt transported to CT via cart.  

## 2019-06-30 ENCOUNTER — Inpatient Hospital Stay (HOSPITAL_COMMUNITY): Payer: 59

## 2019-06-30 DIAGNOSIS — Z86718 Personal history of other venous thrombosis and embolism: Secondary | ICD-10-CM | POA: Diagnosis not present

## 2019-06-30 DIAGNOSIS — G4730 Sleep apnea, unspecified: Secondary | ICD-10-CM | POA: Diagnosis present

## 2019-06-30 DIAGNOSIS — J9601 Acute respiratory failure with hypoxia: Secondary | ICD-10-CM | POA: Diagnosis present

## 2019-06-30 DIAGNOSIS — I10 Essential (primary) hypertension: Secondary | ICD-10-CM | POA: Diagnosis present

## 2019-06-30 DIAGNOSIS — Z79891 Long term (current) use of opiate analgesic: Secondary | ICD-10-CM | POA: Diagnosis not present

## 2019-06-30 DIAGNOSIS — E669 Obesity, unspecified: Secondary | ICD-10-CM | POA: Diagnosis present

## 2019-06-30 DIAGNOSIS — G4733 Obstructive sleep apnea (adult) (pediatric): Secondary | ICD-10-CM | POA: Diagnosis present

## 2019-06-30 DIAGNOSIS — I2609 Other pulmonary embolism with acute cor pulmonale: Secondary | ICD-10-CM | POA: Diagnosis not present

## 2019-06-30 DIAGNOSIS — J1282 Pneumonia due to coronavirus disease 2019: Secondary | ICD-10-CM | POA: Diagnosis present

## 2019-06-30 DIAGNOSIS — Z8249 Family history of ischemic heart disease and other diseases of the circulatory system: Secondary | ICD-10-CM | POA: Diagnosis not present

## 2019-06-30 DIAGNOSIS — Z794 Long term (current) use of insulin: Secondary | ICD-10-CM | POA: Diagnosis not present

## 2019-06-30 DIAGNOSIS — Z823 Family history of stroke: Secondary | ICD-10-CM | POA: Diagnosis not present

## 2019-06-30 DIAGNOSIS — I2692 Saddle embolus of pulmonary artery without acute cor pulmonale: Secondary | ICD-10-CM

## 2019-06-30 DIAGNOSIS — I2699 Other pulmonary embolism without acute cor pulmonale: Secondary | ICD-10-CM

## 2019-06-30 DIAGNOSIS — E1165 Type 2 diabetes mellitus with hyperglycemia: Secondary | ICD-10-CM | POA: Diagnosis present

## 2019-06-30 DIAGNOSIS — Z833 Family history of diabetes mellitus: Secondary | ICD-10-CM | POA: Diagnosis not present

## 2019-06-30 DIAGNOSIS — Z7951 Long term (current) use of inhaled steroids: Secondary | ICD-10-CM | POA: Diagnosis not present

## 2019-06-30 DIAGNOSIS — Z8349 Family history of other endocrine, nutritional and metabolic diseases: Secondary | ICD-10-CM | POA: Diagnosis not present

## 2019-06-30 DIAGNOSIS — Z79899 Other long term (current) drug therapy: Secondary | ICD-10-CM | POA: Diagnosis not present

## 2019-06-30 DIAGNOSIS — U071 COVID-19: Secondary | ICD-10-CM | POA: Diagnosis present

## 2019-06-30 DIAGNOSIS — G43909 Migraine, unspecified, not intractable, without status migrainosus: Secondary | ICD-10-CM | POA: Diagnosis present

## 2019-06-30 DIAGNOSIS — I2693 Single subsegmental pulmonary embolism without acute cor pulmonale: Secondary | ICD-10-CM | POA: Diagnosis present

## 2019-06-30 DIAGNOSIS — J45909 Unspecified asthma, uncomplicated: Secondary | ICD-10-CM | POA: Diagnosis present

## 2019-06-30 LAB — COMPREHENSIVE METABOLIC PANEL
ALT: 17 U/L (ref 0–44)
AST: 17 U/L (ref 15–41)
Albumin: 2.4 g/dL — ABNORMAL LOW (ref 3.5–5.0)
Alkaline Phosphatase: 99 U/L (ref 38–126)
Anion gap: 17 — ABNORMAL HIGH (ref 5–15)
BUN: 6 mg/dL (ref 6–20)
CO2: 24 mmol/L (ref 22–32)
Calcium: 8.9 mg/dL (ref 8.9–10.3)
Chloride: 97 mmol/L — ABNORMAL LOW (ref 98–111)
Creatinine, Ser: 0.77 mg/dL (ref 0.44–1.00)
GFR calc Af Amer: >60 mL/min (ref >60)
GFR calc non Af Amer: >60 mL/min (ref >60)
Glucose, Bld: 324 mg/dL — ABNORMAL HIGH (ref 70–99)
Potassium: 3.8 mmol/L (ref 3.5–5.1)
Sodium: 138 mmol/L (ref 135–145)
Total Bilirubin: 1.3 mg/dL — ABNORMAL HIGH (ref 0.3–1.2)
Total Protein: 7.6 g/dL (ref 6.5–8.1)

## 2019-06-30 LAB — LACTATE DEHYDROGENASE: LDH: 480 U/L — ABNORMAL HIGH (ref 98–192)

## 2019-06-30 LAB — CBG MONITORING, ED
Glucose-Capillary: 378 mg/dL — ABNORMAL HIGH (ref 70–99)
Glucose-Capillary: 377 mg/dL — ABNORMAL HIGH (ref 70–99)
Glucose-Capillary: 449 mg/dL — ABNORMAL HIGH (ref 70–99)

## 2019-06-30 LAB — HEPARIN LEVEL (UNFRACTIONATED)
Heparin Unfractionated: <0.1 IU/mL — ABNORMAL LOW (ref 0.30–0.70)
Heparin Unfractionated: 0.69 IU/mL (ref 0.30–0.70)

## 2019-06-30 LAB — ABO/RH: ABO/RH(D): B POS

## 2019-06-30 LAB — D-DIMER, QUANTITATIVE: D-Dimer, Quant: 5.54 ug/mL-FEU — ABNORMAL HIGH (ref 0.00–0.50)

## 2019-06-30 LAB — MRSA PCR SCREENING: MRSA by PCR: NEGATIVE

## 2019-06-30 LAB — C-REACTIVE PROTEIN: CRP: 17.5 mg/dL — ABNORMAL HIGH (ref <1.0)

## 2019-06-30 LAB — FIBRINOGEN: Fibrinogen: 775 mg/dL — ABNORMAL HIGH (ref 210–475)

## 2019-06-30 LAB — TROPONIN I (HIGH SENSITIVITY)
Troponin I (High Sensitivity): 15 ng/L (ref <18)
Troponin I (High Sensitivity): 13 ng/L (ref <18)
Troponin I (High Sensitivity): 9 ng/L (ref <18)

## 2019-06-30 LAB — HIV ANTIBODY (ROUTINE TESTING W REFLEX): HIV Screen 4th Generation wRfx: NONREACTIVE

## 2019-06-30 LAB — HEMOGLOBIN A1C
Hgb A1c MFr Bld: 11.7 % — ABNORMAL HIGH (ref 4.8–5.6)
Mean Plasma Glucose: 289.09 mg/dL

## 2019-06-30 LAB — ECHOCARDIOGRAM LIMITED

## 2019-06-30 LAB — GLUCOSE, CAPILLARY: Glucose-Capillary: 269 mg/dL — ABNORMAL HIGH (ref 70–99)

## 2019-06-30 LAB — PROCALCITONIN: Procalcitonin: 0.15 ng/mL

## 2019-06-30 LAB — HCG, QUANTITATIVE, PREGNANCY: hCG, Beta Chain, Quant, S: <1 m[IU]/mL (ref <5)

## 2019-06-30 LAB — FERRITIN: Ferritin: 413 ng/mL — ABNORMAL HIGH (ref 11–307)

## 2019-06-30 MED ORDER — SODIUM CHLORIDE 0.9 % IV SOLN
100.0000 mg | Freq: Every day | INTRAVENOUS | Status: DC
  Administered 2019-07-01 – 2019-07-03 (×3): 100 mg via INTRAVENOUS
  Filled 2019-06-30 (×3): qty 20

## 2019-06-30 MED ORDER — SODIUM CHLORIDE 0.9 % IV SOLN
200.0000 mg | Freq: Once | INTRAVENOUS | Status: DC
  Filled 2019-06-30: qty 40

## 2019-06-30 MED ORDER — DOXYCYCLINE HYCLATE 100 MG PO TABS
100.0000 mg | ORAL_TABLET | Freq: Two times a day (BID) | ORAL | Status: DC
  Administered 2019-06-30 – 2019-07-03 (×8): 100 mg via ORAL
  Filled 2019-06-30 (×8): qty 1

## 2019-06-30 MED ORDER — LACTATED RINGERS IV SOLN: INTRAVENOUS | Status: DC

## 2019-06-30 MED ORDER — INSULIN ASPART 100 UNIT/ML ~~LOC~~ SOLN
0.0000 [IU] | Freq: Every day | SUBCUTANEOUS | Status: DC
  Administered 2019-06-30 – 2019-07-01 (×2): 3 [IU] via SUBCUTANEOUS
  Administered 2019-07-02: 2 [IU] via SUBCUTANEOUS

## 2019-06-30 MED ORDER — INSULIN ASPART 100 UNIT/ML ~~LOC~~ SOLN
0.0000 [IU] | Freq: Three times a day (TID) | SUBCUTANEOUS | Status: DC
  Administered 2019-06-30: 20 [IU] via SUBCUTANEOUS
  Administered 2019-06-30: 15 [IU] via SUBCUTANEOUS
  Administered 2019-07-01: 11 [IU] via SUBCUTANEOUS
  Administered 2019-07-02: 3 [IU] via SUBCUTANEOUS
  Administered 2019-07-02: 4 [IU] via SUBCUTANEOUS
  Administered 2019-07-02: 11 [IU] via SUBCUTANEOUS
  Administered 2019-07-03: 3 [IU] via SUBCUTANEOUS

## 2019-06-30 MED ORDER — DEXAMETHASONE SODIUM PHOSPHATE 4 MG/ML IJ SOLN: 4.0000 mg | INTRAMUSCULAR | Status: DC

## 2019-06-30 MED ORDER — IPRATROPIUM-ALBUTEROL 20-100 MCG/ACT IN AERS
1.0000 | INHALATION_SPRAY | Freq: Four times a day (QID) | RESPIRATORY_TRACT | Status: DC
  Administered 2019-06-30 – 2019-07-03 (×14): 1 via RESPIRATORY_TRACT
  Filled 2019-06-30: qty 4

## 2019-06-30 MED ORDER — INSULIN GLARGINE 100 UNIT/ML ~~LOC~~ SOLN
80.0000 [IU] | Freq: Two times a day (BID) | SUBCUTANEOUS | Status: DC
  Administered 2019-06-30 – 2019-07-03 (×7): 80 [IU] via SUBCUTANEOUS
  Filled 2019-06-30 (×8): qty 0.8

## 2019-06-30 MED ORDER — IOHEXOL 300 MG/ML  SOLN
100.0000 mL | Freq: Once | INTRAMUSCULAR | Status: AC | PRN
  Administered 2019-06-30: 100 mL via INTRAVENOUS

## 2019-06-30 MED ORDER — ZINC SULFATE 220 (50 ZN) MG PO CAPS
220.0000 mg | ORAL_CAPSULE | Freq: Every day | ORAL | Status: DC
  Administered 2019-06-30 – 2019-07-03 (×4): 220 mg via ORAL
  Filled 2019-06-30 (×4): qty 1

## 2019-06-30 MED ORDER — SUMATRIPTAN SUCCINATE 100 MG PO TABS
100.0000 mg | ORAL_TABLET | ORAL | Status: DC | PRN
  Administered 2019-07-02: 100 mg via ORAL
  Filled 2019-06-30 (×3): qty 1

## 2019-06-30 MED ORDER — IOHEXOL 300 MG/ML  SOLN: 100.0000 mL | Freq: Once | INTRAMUSCULAR | Status: DC | PRN

## 2019-06-30 MED ORDER — INSULIN GLARGINE 100 UNIT/ML SOLOSTAR PEN: 80.0000 [IU] | PEN_INJECTOR | Freq: Two times a day (BID) | SUBCUTANEOUS | Status: DC

## 2019-06-30 MED ORDER — HEPARIN BOLUS VIA INFUSION
4000.0000 [IU] | Freq: Once | INTRAVENOUS | Status: AC
  Administered 2019-06-30: 4000 [IU] via INTRAVENOUS
  Filled 2019-06-30: qty 4000

## 2019-06-30 MED ORDER — SODIUM CHLORIDE 0.9 % IV SOLN
200.0000 mg | Freq: Once | INTRAVENOUS | Status: AC
  Administered 2019-06-30: 200 mg via INTRAVENOUS
  Filled 2019-06-30: qty 200

## 2019-06-30 MED ORDER — ROSUVASTATIN CALCIUM 5 MG PO TABS
10.0000 mg | ORAL_TABLET | ORAL | Status: DC
  Administered 2019-07-01: 10 mg via ORAL
  Filled 2019-06-30: qty 2

## 2019-06-30 MED ORDER — INSULIN ASPART 100 UNIT/ML ~~LOC~~ SOLN: 0.0000 [IU] | Freq: Three times a day (TID) | SUBCUTANEOUS | Status: DC

## 2019-06-30 MED ORDER — ASCORBIC ACID 500 MG PO TABS
500.0000 mg | ORAL_TABLET | Freq: Every day | ORAL | Status: DC
  Administered 2019-06-30 – 2019-07-03 (×4): 500 mg via ORAL
  Filled 2019-06-30 (×4): qty 1

## 2019-06-30 MED ORDER — ADULT MULTIVITAMIN W/MINERALS CH
1.0000 | ORAL_TABLET | Freq: Every day | ORAL | Status: DC
  Administered 2019-06-30 – 2019-07-03 (×4): 1 via ORAL
  Filled 2019-06-30 (×4): qty 1

## 2019-06-30 MED ORDER — FOLIC ACID 1 MG PO TABS
1.0000 mg | ORAL_TABLET | Freq: Every day | ORAL | Status: DC
  Administered 2019-06-30 – 2019-07-03 (×4): 1 mg via ORAL
  Filled 2019-06-30 (×4): qty 1

## 2019-06-30 MED ORDER — SODIUM CHLORIDE 0.9 % IV SOLN: 100.0000 mg | Freq: Every day | INTRAVENOUS | Status: DC

## 2019-06-30 MED ORDER — INSULIN ASPART 100 UNIT/ML ~~LOC~~ SOLN: 0.0000 [IU] | Freq: Every day | SUBCUTANEOUS | Status: DC

## 2019-06-30 MED ORDER — INSULIN ASPART 100 UNIT/ML ~~LOC~~ SOLN
8.0000 [IU] | Freq: Three times a day (TID) | SUBCUTANEOUS | Status: DC
  Administered 2019-06-30 – 2019-07-03 (×10): 8 [IU] via SUBCUTANEOUS

## 2019-06-30 MED ORDER — INSULIN GLARGINE 100 UNIT/ML ~~LOC~~ SOLN
20.0000 [IU] | Freq: Two times a day (BID) | SUBCUTANEOUS | Status: DC
  Administered 2019-06-30: 20 [IU] via SUBCUTANEOUS
  Filled 2019-06-30 (×4): qty 0.2

## 2019-06-30 MED ORDER — DEXAMETHASONE SODIUM PHOSPHATE 10 MG/ML IJ SOLN
6.0000 mg | INTRAMUSCULAR | Status: DC
  Administered 2019-06-30: 6 mg via INTRAVENOUS
  Filled 2019-06-30: qty 1

## 2019-06-30 MED ORDER — INSULIN ASPART 100 UNIT/ML ~~LOC~~ SOLN
3.0000 [IU] | Freq: Three times a day (TID) | SUBCUTANEOUS | Status: DC
  Administered 2019-06-30: 3 [IU] via SUBCUTANEOUS

## 2019-06-30 MED ORDER — TOPIRAMATE 100 MG PO TABS
200.0000 mg | ORAL_TABLET | Freq: Every day | ORAL | Status: DC
  Administered 2019-06-30 – 2019-07-02 (×3): 200 mg via ORAL
  Filled 2019-06-30 (×3): qty 2

## 2019-06-30 NOTE — Progress Notes (Signed)
ANTICOAGULATION CONSULT NOTE  Pharmacy Consult for Heparin  Indication: pulmonary embolus  Allergies  Allergen Reactions  . Ibuprofen   . Ketorolac      Vital Signs: BP: 138/96 (02/03 0800) Pulse Rate: 98 (02/03 0800)  Labs: Recent Labs    06/29/19 1746 06/29/19 1746 06/30/19 0105 06/30/19 0200 06/30/19 0613 06/30/19 0830  HGB 12.5  --   --   --   --   --   HCT 38.6  --   --   --   --   --   PLT 367  --   --   --   --   --   HEPARINUNFRC  --   --   --   --   --  <0.10*  CREATININE 0.81  --   --  0.77  --   --   TROPONINIHS 10   < > 15 13 9   --    < > = values in this interval not displayed.    Estimated Creatinine Clearance: 97.9 mL/min (by C-G formula based on SCr of 0.77 mg/dL).  Assessment: 39 y/o F with new onset PE in the setting of COVID-19 infection. Initial heparin level was undetectable. Verified with RN that the line was running ok and heparin was not off at any point. No bleeding noted. CBC is WNL.  Goal of Therapy:  Heparin level 0.3-0.7 units/ml Monitor platelets by anticoagulation protocol: Yes   Plan:  Re-bolus heparin bolus 4000 units IV x 1 Increase heparin gtt to 1500 units/hr Check a 6 hr heparin level Daily heparin level and CBC  24, PharmD, BCPS Clinical Pharmacist Please see AMION for all pharmacy numbers 06/30/2019 10:47 AM

## 2019-06-30 NOTE — ED Notes (Signed)
ED TO INPATIENT HANDOFF REPORT  ED Nurse Name and Phone #: [phone]  S Name/Age/Gender Shelly Shields 39 y.o. female Room/Bed: 011C/011C  Code Status   Code Status: Prior  Home/SNF/Other Home Patient oriented to: self Is this baseline? Yes   Triage Complete: Triage complete  Chief Complaint Acute pulmonary embolism (HCC) [I26.99]  Triage Note C/o SOB and cough x 1 week.  States she has pain to center of chest only with coughing.  COVID + on 1/15.    Allergies Allergies  Allergen Reactions  . Ibuprofen   . Ketorolac     Level of Care/Admitting Diagnosis ED Disposition    ED Disposition Condition Comment   Admit  Hospital Area: [facility] [[id number]]  Level of Care: Progressive [102]  Admit to Progressive based on following criteria: CARDIOVASCULAR & THORACIC of moderate stability with acute coronary syndrome symptoms/low risk myocardial infarction/hypertensive urgency/arrhythmias/heart failure potentially compromising stability and stable post cardiovascular intervention patients.  Covid Evaluation: Confirmed COVID Positive  Diagnosis: Acute pulmonary embolism (HCC) [268341]  Admitting Physician: Woody Seller  Attending Physician: Berton Mount I [[id number]]  Estimated length of stay: past midnight tomorrow  Certification:: I certify this patient will need inpatient services for at least 2 midnights       B Medical/Surgery History Past Medical History:  Diagnosis Date  . Asthma   . Diabetes mellitus without complication (HCC)   . Hypertension   . Migraines   . Obesity   . Sleep apnea    Past Surgical History:  Procedure Laterality Date  . BILATERAL CARPAL TUNNEL RELEASE       A IV Location/Drains/Wounds Patient Lines/Drains/Airways Status   Active Line/Drains/Airways    Name:   Placement date:   Placement time:   Site:   Days:   Peripheral IV 06/29/19 Left Antecubital   06/29/19    2045    Antecubital   1   Peripheral  IV 06/30/19 Right Antecubital   06/30/19    0205    Antecubital   less than 1          Intake/Output Last 24 hours No intake or output data in the 24 hours ending 06/30/19 1643  Labs/Imaging Results for orders placed or performed during the hospital encounter of 06/29/19 (from the past 48 hour(s))  Basic metabolic panel     Status: Abnormal   Collection Time: 06/29/19  5:46 PM  Result Value Ref Range   Sodium 133 (L) 135 - 145 mmol/L   Potassium 3.6 3.5 - 5.1 mmol/L   Chloride 93 (L) 98 - 111 mmol/L   CO2 25 22 - 32 mmol/L   Glucose, Bld 463 (H) 70 - 99 mg/dL   BUN 6 6 - 20 mg/dL   Creatinine, Ser 9.62 0.44 - 1.00 mg/dL   Calcium 8.7 (L) 8.9 - 10.3 mg/dL   GFR calc non Af Amer >60 >60 mL/min   GFR calc Af Amer >60 >60 mL/min   Anion gap 15 5 - 15    Comment: Performed at [facility], [address], [place], [place] [address]  CBC     Status: Abnormal   Collection Time: 06/29/19  5:46 PM  Result Value Ref Range   WBC 13.2 (H) 4.0 - 10.5 K/uL   RBC 4.30 3.87 - 5.11 MIL/uL   Hemoglobin 12.5 12.0 - 15.0 g/dL   HCT 89.2 11.9 - 41.7 %   MCV 89.8 80.0 - 100.0 fL   MCH  29.1 26.0 - 34.0 pg   MCHC 32.4 30.0 - 36.0 g/dL   RDW 12.8 78.6 - 76.7 %   Platelets 367 150 - 400 K/uL   nRBC 0.0 0.0 - 0.2 %    Comment: Performed at [facility], [address], [place], [place] [address]  Troponin I (High Sensitivity)     Status: None   Collection Time: 06/29/19  5:46 PM  Result Value Ref Range   Troponin I (High Sensitivity) 10 <18 ng/L    Comment: (NOTE) Elevated high sensitivity troponin I (hsTnI) values and significant  changes across serial measurements may suggest ACS but many other  chronic and acute conditions are known to elevate hsTnI results.  Refer to the "Links" section for chest pain algorithms and additional  guidance. Performed at [facility], [address] [place], [place] [address]   I-Stat beta hCG blood, ED     Status: None   Collection  Time: 06/29/19  6:18 PM  Result Value Ref Range   I-stat hCG, quantitative <5.0 <5 mIU/mL   Comment 3            Comment:   GEST. AGE      CONC.  (mIU/mL)   <=1 WEEK        5 - 50     2 WEEKS       50 - 500     3 WEEKS       100 - 10,000     4 WEEKS     1,000 - 30,000        FEMALE AND NON-PREGNANT FEMALE:     LESS THAN 5 mIU/mL   Troponin I (High Sensitivity)     Status: None   Collection Time: 06/30/19  1:05 AM  Result Value Ref Range   Troponin I (High Sensitivity) 15 <18 ng/L    Comment: (NOTE) Elevated high sensitivity troponin I (hsTnI) values and significant  changes across serial measurements may suggest ACS but many other  chronic and acute conditions are known to elevate hsTnI results.  Refer to the "Links" section for chest pain algorithms and additional  guidance. Performed at [facility], [address] [place], [place] [address]   HIV Antibody (routine testing w rflx)     Status: None   Collection Time: 06/30/19  2:00 AM  Result Value Ref Range   HIV Screen 4th Generation wRfx NON REACTIVE NON REACTIVE    Comment: Performed at [facility], [address], [place], [place] [address]  C-reactive protein     Status: Abnormal   Collection Time: 06/30/19  2:00 AM  Result Value Ref Range   CRP 17.5 (H) <1.0 mg/dL    Comment: Performed at [facility], [address] [place], [place] [address]  Comprehensive metabolic panel     Status: Abnormal   Collection Time: 06/30/19  2:00 AM  Result Value Ref Range   Sodium 138 135 - 145 mmol/L   Potassium 3.8 3.5 - 5.1 mmol/L   Chloride 97 (L) 98 - 111 mmol/L   CO2 24 22 - 32 mmol/L   Glucose, Bld 324 (H) 70 - 99 mg/dL   BUN 6 6 - 20 mg/dL   Creatinine, Ser 4.65 0.44 - 1.00 mg/dL   Calcium 8.9 8.9 - 68.1 mg/dL   Total Protein 7.6 6.5 - 8.1 g/dL   Albumin 2.4 (L) 3.5 - 5.0 g/dL   AST 17  15 - 41 U/L   ALT 17 0 - 44 U/L   Alkaline Phosphatase 99 38 - 126 U/L   Total Bilirubin 1.3 (H) 0.3 - 1.2 mg/dL    GFR calc non Af Amer >60 >60 mL/min   GFR calc Af Amer >60 >60 mL/min   Anion gap 17 (H) 5 - 15    Comment: Performed at [facility], [address], [place], [place] [address]  D-dimer, quantitative (not at [facility])     Status: Abnormal   Collection Time: 06/30/19  2:00 AM  Result Value Ref Range   D-Dimer, Quant 5.54 (H) 0.00 - 0.50 ug/mL-FEU    Comment: (NOTE) At the manufacturer cut-off of 0.50 ug/mL FEU, this assay has been documented to exclude PE with a sensitivity and negative predictive value of 97 to 99%.  At this time, this assay has not been approved by the FDA to exclude DVT/VTE. Results should be correlated with clinical presentation. Performed at [facility], [address], [place], [place] [address]   Ferritin     Status: Abnormal   Collection Time: 06/30/19  2:00 AM  Result Value Ref Range   Ferritin 413 (H) 11 - 307 ng/mL    Comment: Performed at [facility], [address], [place], [place] [address]  Fibrinogen     Status: Abnormal   Collection Time: 06/30/19  2:00 AM  Result Value Ref Range   Fibrinogen 775 (H) 210 - 475 mg/dL    Comment: Performed at [facility], [address], [place], [place] [address]  Lactate dehydrogenase     Status: Abnormal   Collection Time: 06/30/19  2:00 AM  Result Value Ref Range   LDH 480 (H) 98 - 192 U/L    Comment: Performed at [facility], [address], [place], [place] [address]  Procalcitonin     Status: None   Collection Time: 06/30/19  2:00 AM  Result Value Ref Range   Procalcitonin 0.15 ng/mL    Comment:        Interpretation: PCT (Procalcitonin) <= 0.5 ng/mL: Systemic infection (sepsis) is not likely. Local bacterial infection is possible. (NOTE)       Sepsis PCT Algorithm           Lower Respiratory Tract                                      Infection PCT Algorithm    ----------------------------     ----------------------------         PCT < 0.25 ng/mL                PCT <  0.10 ng/mL         Strongly encourage             Strongly discourage   discontinuation of antibiotics    initiation of antibiotics    ----------------------------     -----------------------------       PCT 0.25 - 0.50 ng/mL            PCT 0.10 - 0.25 ng/mL               OR       >80% decrease in PCT            Discourage initiation of  antibiotics      Encourage discontinuation           of antibiotics    ----------------------------     -----------------------------         PCT >= 0.50 ng/mL              PCT 0.26 - 0.50 ng/mL               AND        <80% decrease in PCT             Encourage initiation of                                             antibiotics       Encourage continuation           of antibiotics    ----------------------------     -----------------------------        PCT >= 0.50 ng/mL                  PCT > 0.50 ng/mL               AND         increase in PCT                  Strongly encourage                                      initiation of antibiotics    Strongly encourage escalation           of antibiotics                                     -----------------------------                                           PCT <= 0.25 ng/mL                                                 OR                                        > 80% decrease in PCT                                     Discontinue / Do not initiate                                             antibiotics Performed at [facility], [address] [place], [place] [address]   Troponin I (High Sensitivity)     Status: None   Collection Time:  06/30/19  2:00 AM  Result Value Ref Range   Troponin I (High Sensitivity) 13 <18 ng/L    Comment: (NOTE) Elevated high sensitivity troponin I (hsTnI) values and significant  changes across serial measurements may suggest ACS but many other  chronic and acute conditions are known to elevate hsTnI results.   Refer to the Links section for chest pain algorithms and additional  guidance. Performed at [facility], [address] [place], [place] [address]   Hemoglobin A1c     Status: Abnormal   Collection Time: 06/30/19  2:00 AM  Result Value Ref Range   Hgb A1c MFr Bld 11.7 (H) 4.8 - 5.6 %    Comment: (NOTE) Pre diabetes:          5.7%-6.4% Diabetes:              >6.4% Glycemic control for   <7.0% adults with diabetes    Mean Plasma Glucose 289.09 mg/dL    Comment: Performed at [facility], [address], [place], [place] [address]  ABO/Rh     Status: None   Collection Time: 06/30/19  3:18 AM  Result Value Ref Range   ABO/RH(D)      B POS Performed at [facility], [address], [place], [place] [address]   Troponin I (High Sensitivity)     Status: None   Collection Time: 06/30/19  6:13 AM  Result Value Ref Range   Troponin I (High Sensitivity) 9 <18 ng/L    Comment: (NOTE) Elevated high sensitivity troponin I (hsTnI) values and significant  changes across serial measurements may suggest ACS but many other  chronic and acute conditions are known to elevate hsTnI results.  Refer to the "Links" section for chest pain algorithms and additional  guidance. Performed at [facility], [address] [place], [place] [address]   CBG monitoring, ED     Status: Abnormal   Collection Time: 06/30/19  8:19 AM  Result Value Ref Range   Glucose-Capillary 378 (H) 70 - 99 mg/dL  Heparin level (unfractionated)     Status: Abnormal   Collection Time: 06/30/19  8:30 AM  Result Value Ref Range   Heparin Unfractionated <0.10 (L) 0.30 - 0.70 IU/mL    Comment: REPEATED TO VERIFY (NOTE) If heparin results are below expected values, and patient dosage has  been confirmed, suggest follow up testing of antithrombin III levels. Performed at [facility], [address], [place], [place] [address]   CBG monitoring, ED     Status: Abnormal   Collection Time:  06/30/19 11:13 AM  Result Value Ref Range   Glucose-Capillary 377 (H) 70 - 99 mg/dL  CBG monitoring, ED     Status: Abnormal   Collection Time: 06/30/19 12:33 PM  Result Value Ref Range   Glucose-Capillary 449 (H) 70 - 99 mg/dL  hCG, quantitative, pregnancy     Status: None   Collection Time: 06/30/19  1:04 PM  Result Value Ref Range   hCG, Beta Chain, Quant, S <1 <5 mIU/mL    Comment:          GEST. AGE      CONC.  (mIU/mL)   <=1 WEEK        5 - 50     2 WEEKS       50 - 500     3 WEEKS       100 - 10,000  4 WEEKS     1,000 - 30,000     5 WEEKS     3,500 - 115,000   6-8 WEEKS     12,000 - 270,000    12 WEEKS     15,000 - 220,000        FEMALE AND NON-PREGNANT FEMALE:     LESS THAN 5 mIU/mL Performed at [facility], [place] [address] [place], [place] [address]    CT Angio Chest PE W and/or Wo Contrast  Result Date: 06/29/2019 CLINICAL DATA:  Shortness of breath EXAM: CT ANGIOGRAPHY CHEST WITH CONTRAST TECHNIQUE: Multidetector CT imaging of the chest was performed using the standard protocol during bolus administration of intravenous contrast. Multiplanar CT image reconstructions and MIPs were obtained to evaluate the vascular anatomy. CONTRAST:  73mL OMNIPAQUE IOHEXOL 350 MG/ML SOLN COMPARISON:  None. FINDINGS: Cardiovascular: Contrast injection is sufficient to demonstrate satisfactory opacification of the pulmonary arteries to the segmental level.There are extensive acute bilateral pulmonary emboli involving the main left pulmonary artery extending into the lobar, segmental, and subsegmental branches bilaterally. There are lobar, segmental, and subsegmental pulmonary emboli involving the right lower lobe. Segmental and subsegmental pulmonary emboli are noted involving the right upper lobe. There is borderline right-sided heart strain with an RV LV ratio measuring approximately 0.9. There is however mild reflux of contrast in the IVC consistent with underlying cardiac dysfunction.  Heart size is enlarged. Mediastinum/Nodes: --No mediastinal or hilar lymphadenopathy. --No axillary lymphadenopathy. --No supraclavicular lymphadenopathy. --Normal thyroid gland. --The esophagus is unremarkable Lungs/Pleura: Patchy bilateral ground-glass airspace opacities are noted bilaterally. There is no pneumothorax. No large pleural effusion. The trachea is unremarkable. Upper Abdomen: No acute abnormality. Musculoskeletal: No chest wall abnormality. No acute or significant osseous findings. Review of the MIP images confirms the above findings. IMPRESSION: 1. Extensive bilateral acute pulmonary emboli as detailed above. The RV/LV ratio is borderline measuring 0.9, however there is reflux of contrast in the IVC consistent with right-sided heart strain. 2. Diffuse bilateral ground-glass airspace opacities concerning for an atypical infectious process such as viral pneumonia. These results were called by telephone at the time of interpretation on 06/29/2019 at 11:22 pm to provider [facility] , who verbally acknowledged these results. Electronically Signed   By: Constance Holster M.D.   On: 06/29/2019 23:26   CT PELVIS W CONTRAST  Result Date: 06/30/2019 CLINICAL DATA:  Right gluteal inflammation on prior CT, abscess EXAM: CT PELVIS WITH CONTRAST TECHNIQUE: Multidetector CT imaging of the pelvis was performed using the standard protocol following the bolus administration of intravenous contrast. CONTRAST:  15mL OMNIPAQUE IOHEXOL 300 MG/ML  SOLN COMPARISON:  06/22/2019 FINDINGS: Urinary Tract: There is excreted contrast filling the urinary bladder without filling defect. Distal ureters are unremarkable. Bowel:  No bowel obstruction or ileus. Vascular/Lymphatic: No pathologically enlarged lymph nodes. No significant vascular abnormality seen. Reproductive:  No mass or other significant abnormality Other: Inflammatory changes seen within the medial inferior aspect of the right gluteal fold are again identified.  There is no fluid collection or formed abscess. Musculoskeletal: No acute or destructive bony lesions. Reconstructed images demonstrate no additional findings. IMPRESSION: 1. Inflammatory changes within the medial inferior aspect of the right gluteal fold consistent with cellulitis. No fluid collection or formed abscess. Electronically Signed   By: Randa Ngo M.D.   On: 06/30/2019 11:13   VAS [place] LOWER EXTREMITY VENOUS (DVT)  Result Date: 06/30/2019  Lower Venous DVTStudy Indications: Pulmonary embolism.  Comparison Study: no prior Performing Technologist:  Abram Sander RVS  Examination Guidelines: A complete evaluation includes B-mode imaging, spectral Doppler, color Doppler, and power Doppler as needed of all accessible portions of each vessel. Bilateral testing is considered an integral part of a complete examination. Limited examinations for reoccurring indications may be performed as noted. The reflux portion of the exam is performed with the patient in reverse Trendelenburg.  +---------+---------------+---------+-----------+----------+--------------+ RIGHT    CompressibilityPhasicitySpontaneityPropertiesThrombus Aging +---------+---------------+---------+-----------+----------+--------------+ CFV      Full           Yes      Yes                                 +---------+---------------+---------+-----------+----------+--------------+ SFJ      Full                                                        +---------+---------------+---------+-----------+----------+--------------+ FV Prox  Full                                                        +---------+---------------+---------+-----------+----------+--------------+ FV Mid   Full                                                        +---------+---------------+---------+-----------+----------+--------------+ FV DistalFull                                                         +---------+---------------+---------+-----------+----------+--------------+ PFV      Full                                                        +---------+---------------+---------+-----------+----------+--------------+ POP      Full           Yes      Yes                                 +---------+---------------+---------+-----------+----------+--------------+ PTV      Full                                                        +---------+---------------+---------+-----------+----------+--------------+ PERO     Full                                                        +---------+---------------+---------+-----------+----------+--------------+   +---------+---------------+---------+-----------+----------+--------------+  LEFT     CompressibilityPhasicitySpontaneityPropertiesThrombus Aging +---------+---------------+---------+-----------+----------+--------------+ CFV      Full           Yes      Yes                                 +---------+---------------+---------+-----------+----------+--------------+ SFJ      Full                                                        +---------+---------------+---------+-----------+----------+--------------+ FV Prox  Full                                                        +---------+---------------+---------+-----------+----------+--------------+ FV Mid   Full                                                        +---------+---------------+---------+-----------+----------+--------------+ FV DistalFull                                                        +---------+---------------+---------+-----------+----------+--------------+ PFV      Full                                                        +---------+---------------+---------+-----------+----------+--------------+ POP      Full           Yes      Yes                                  +---------+---------------+---------+-----------+----------+--------------+ PTV      Full                                                        +---------+---------------+---------+-----------+----------+--------------+ PERO     Full                                                        +---------+---------------+---------+-----------+----------+--------------+     Summary: BILATERAL: - No evidence of deep vein thrombosis seen in the lower extremities, bilaterally.   *See table(s) above for measurements and observations.    Preliminary     Pending Labs [employer] (From admission, onward)    Start  Ordered   07/01/19 0500  Heparin level (unfractionated)  Daily,   R     06/30/19 0804   07/01/19 0500  Comprehensive metabolic panel  Daily,   R    Question:  Specimen collection method  Answer:  Lab=Lab collect   06/30/19 0805   07/01/19 0500  CBC with Differential/Platelet  Daily,   R    Question:  Specimen collection method  Answer:  Lab=Lab collect   06/30/19 0805   07/01/19 0500  Brain natriuretic peptide  Daily,   R    Question:  Specimen collection method  Answer:  Lab=Lab collect   06/30/19 0805   07/01/19 0500  Magnesium  Daily,   R    Question:  Specimen collection method  Answer:  Lab=Lab collect   06/30/19 0805   07/01/19 0500  D-dimer, quantitative (not at [facility])  Daily,   R     06/30/19 0805   07/01/19 0500  C-reactive protein  Daily,   R    Question:  Specimen collection method  Answer:  Lab=Lab collect   06/30/19 0805   06/30/19 1730  Heparin level (unfractionated)  Once-Timed,   STAT     06/30/19 1048   06/30/19 0844  MRSA PCR Screening  ONCE - STAT,   STAT     06/30/19 0844   06/30/19 0318  Respiratory Panel by PCR  Add-on,   AD     06/30/19 0318   06/30/19 0318  Influenza panel by PCR (type A & B)  Add-on,   AD     06/30/19 0318          Vitals/Pain Today's Vitals   06/30/19 1300 06/30/19 1345 06/30/19 1400 06/30/19 1500  BP: (!) 145/97   100/84 (!) 134/97  Pulse: (!) 110 (!) 109  (!) 118  Resp: (!) 30 (!) 26  (!) 29  Temp:      TempSrc:      SpO2: 91% 93%  92%  PainSc:        Isolation Precautions Airborne and Contact precautions  Medications Medications  heparin ADULT infusion 100 units/mL (25000 units/264mL sodium chloride 0.45%) (1,500 Units/hr Intravenous Transfusing/Transfer 06/30/19 1640)  SUMAtriptan (IMITREX) tablet 100 mg (has no administration in time range)  doxycycline (VIBRA-TABS) tablet 100 mg (100 mg Oral Given 06/30/19 1153)  Ipratropium-Albuterol (COMBIVENT) respimat 1 puff (1 puff Inhalation Given 06/30/19 1401)  ascorbic acid (VITAMIN C) tablet 500 mg (500 mg Oral Given 06/30/19 1153)  zinc sulfate capsule 220 mg (220 mg Oral Given 06/30/19 1153)  folic acid (FOLVITE) tablet 1 mg (1 mg Oral Given 06/30/19 1154)  multivitamin with minerals tablet 1 tablet (1 tablet Oral Given 06/30/19 1152)  remdesivir 200 mg in sodium chloride 0.9% 250 mL IVPB (0 mg Intravenous Stopped 06/30/19 0317)    Followed by  remdesivir 100 mg in sodium chloride 0.9 % 100 mL IVPB (has no administration in time range)  lactated ringers infusion ( Intravenous Transfusing/Transfer 06/30/19 1642)  rosuvastatin (CRESTOR) tablet 10 mg (has no administration in time range)  topiramate (TOPAMAX) tablet 200 mg (has no administration in time range)  insulin aspart (novoLOG) injection 0-5 Units (has no administration in time range)  insulin aspart (novoLOG) injection 0-20 Units (0 Units Subcutaneous Not Given 06/30/19 1120)  insulin glargine (LANTUS) injection 80 Units (80 Units Subcutaneous Given 06/30/19 1114)  iohexol (OMNIPAQUE) 300 MG/ML solution 100 mL (has no administration in time range)  dexamethasone (DECADRON) injection 4 mg (has no administration in time  range)  insulin aspart (novoLOG) injection 8 Units (8 Units Subcutaneous Given 06/30/19 1434)  sodium chloride flush (NS) 0.9 % injection 3 mL (3 mLs Intravenous Given 06/30/19 0317)  iohexol  (OMNIPAQUE) 350 MG/ML injection 80 mL (80 mLs Intravenous Contrast Given 06/29/19 2304)  heparin bolus via infusion 5,000 Units (5,000 Units Intravenous Bolus from Bag 06/29/19 2347)  heparin bolus via infusion 4,000 Units (4,000 Units Intravenous Bolus from Bag 06/30/19 1119)  iohexol (OMNIPAQUE) 300 MG/ML solution 100 mL (100 mLs Intravenous Contrast Given 06/30/19 1102)    Mobility walks Moderate fall risk   Focused Assessments Pulmonary Assessment Handoff:  Lung sounds:   O2 Device: Room Air        R Recommendations: See Admitting Provider Note  Report given to:   Additional Notes:

## 2019-06-30 NOTE — ED Notes (Signed)
Pt called out c/o CP and having coughing fit. RN shot EKG, paged floor coverage, and gave combivent inhaler.

## 2019-06-30 NOTE — H&P (Signed)
History and Physical  Shelly Shields NFA:[id number] DOB: April 25, 1981 DOA: 06/29/2019  Referring physician: ER provider PCP: [name], MD  Outpatient Specialists:    Patient coming from: Home  Chief Complaint: Shortness of breath  HPI:  Patient is a 39 year old female, obese, with past medical history significant for OSA, diabetes mellitus, hypertension and migraine.  Apparently, patient was diagnosed with Covid on June 11, 2019.  Patient has been managed symptomatically at home, with reports of fever, viral syndrome shortness of breath.  Over the last 2 weeks, shortness of breath has been progressive weight significant shortness of breath with minimal exertion.  Patient may have been on Depo-Provera every 3 months.  On presentation to the hospital, CTA of the chest done revealed extensive pulmonary emboli with right heart strain.  Patient is currently on heparin.  No headache, no neck pain, no chest pain, no GI symptoms and no urinary symptoms.  No change in ts. patient be admitted for further assessment and management.  Pulmonary/critical care team is also been consulted.  On further questioning, patient tells me that she developed gluteal abscess between none and when she was first diagnosed with Covid, currently on doxycycline.  ED Course: On presentation to the hospital, temperature of 98.9, blood pressure 134/99, heart rate of 102, respiratory rate of 18 and O2 sat of 94%.  Patient is currently on heparin.  Critical care team has been consulted.  Pertinent labs: Chemistry reveals sodium of 133, potassium of 3.6, chloride 93, CO2 25, BUN of 6, creatinine of 0.81 with blood sugar of 463 point is 10.  CBC reveals WBC of 13.2, hemoglobin of 12.5, hematocrit of 38.6, MCV of 89.1 platelet count of 367.  EKG: Independently reviewed.   Imaging: independently reviewed.   Review of Systems:  Negative for visual changes, sore throat, rash, new muscle aches, chest pain, dysuria, bleeding,  n/v/abdominal pain.  Past Medical History:  Diagnosis Date  . Asthma   . Diabetes mellitus without complication ([place])   . Hypertension   . Migraines   . Obesity   . Sleep apnea     Past Surgical History:  Procedure Laterality Date  . BILATERAL CARPAL TUNNEL RELEASE       reports that she has never smoked. She has never used smokeless tobacco. She reports current alcohol use. She reports that she does not use drugs.  Allergies  Allergen Reactions  . Ibuprofen   . Ketorolac     Family History  Problem Relation Age of Onset  . Hypertension Mother   . Thyroid disease Mother   . Hypertension Father   . Stroke Father   . Diabetes Father   . Sleep apnea Father      Prior to Admission medications   Medication Sig Start Date End Date Taking? Authorizing Provider  doxycycline (VIBRAMYCIN) 100 MG capsule Take 1 capsule (100 mg total) by mouth 2 (two) times daily. 06/22/19  Yes Joy, Shawn C, PA-C  Insulin Glargine (LANTUS) 100 UNIT/ML Solostar Pen Inject 35 Units into the skin daily. Patient taking differently: Inject 80 Units into the skin 2 (two) times daily.  11/29/14  Yes Frazier Richards, MD  insulin lispro protamine-lispro (HUMALOG 75/25 MIX) (75-25) 100 UNIT/ML SUSP injection Inject 50 Units into the skin 2 (two) times daily with a meal.    Yes [provider]  omeprazole (PRILOSEC) 40 MG capsule Take 40 mg by mouth daily.   Yes [provider]  VENTOLIN HFA 108 (90 BASE) MCG/ACT  inhaler Use as directed 2 puffs in the mouth or throat 4 (four) times daily as needed. 12/21/14  Yes [provider]  Blood Glucose Monitoring Suppl (ACCU-CHEK NANO SMARTVIEW) W/DEVICE KIT 1 Device by Does not apply route 4 (four) times daily - after meals and at bedtime. 11/29/14   Frazier Richards, MD  canagliflozin (INVOKANA) 300 MG TABS tablet Take 300 mg by mouth daily before breakfast.    [provider]  clotrimazole (LOTRIMIN) 1 % cream Apply to affected area 2 times  daily 06/16/16   Everlene Balls, MD  escitalopram (LEXAPRO) 10 MG tablet Take 10 mg by mouth daily.    [provider]  fexofenadine (ALLEGRA) 180 MG tablet Take 180 mg by mouth daily.    [provider]  fluticasone (FLONASE) 50 MCG/ACT nasal spray Place 2 sprays into both nostrils daily as needed for allergies.  09/22/14   [provider]  fluticasone (FLOVENT HFA) 110 MCG/ACT inhaler Inhale into the lungs 2 (two) times daily.    [provider]  glucose blood (ACCU-CHEK SMARTVIEW) test strip Check sugar 6 x daily Patient taking differently: Check sugar 6 x daily. Uses true test instead 11/29/14   Frazier Richards, MD  HYDROcodone-acetaminophen (NORCO/VICODIN) 5-325 MG tablet Take 1-2 tablets by mouth every 6 (six) hours as needed for severe pain. 06/22/19   Joy, Shawn C, PA-C  Insulin Pen Needle (INSUPEN PEN NEEDLES) 32G X 4 MM MISC BD Pen Needles- brand specific. Inject insulin via insulin pen daily 11/29/14   Frazier Richards, MD  ketotifen (ZADITOR) 0.025 % ophthalmic solution Place 1 drop into both eyes daily as needed (dry eyes).    [provider]  Lancet Devices [facility]) lancets Use as instructed for blood glucose checks four times daily, before meals and at bedtime 11/29/14   Frazier Richards, MD  losartan (COZAAR) 25 MG tablet Take 25 mg by mouth daily.    [provider]  medroxyPROGESTERone (DEPO-PROVERA) 150 MG/ML injection Inject 150 mg into the muscle every 3 (three) months. 09/26/14   [provider]  metFORMIN (GLUCOPHAGE-XR) 500 MG 24 hr tablet TK 2 TS PO DAILY 12/20/14   [provider]  ondansetron (ZOFRAN ODT) 4 MG disintegrating tablet Take 1 tablet (4 mg total) by mouth every 8 (eight) hours as needed for nausea or vomiting. 01/29/16   Charlann Lange, PA-C  ondansetron (ZOFRAN) 4 MG tablet Take 1 tablet (4 mg total) by mouth every 6 (six) hours. 01/13/16   Isla Pence, MD  promethazine (PHENERGAN) 25 MG tablet  Take 25 mg by mouth every 6 (six) hours as needed for nausea or vomiting.    [provider]  pseudoephedrine (SUDAFED) 120 MG 12 hr tablet Take 120 mg by mouth daily as needed for congestion.    [provider]  SUMAtriptan (IMITREX) 100 MG tablet Take 1 tablet (100 mg total) by mouth every 2 (two) hours as needed for migraine or headache. 11/29/14   Frazier Richards, MD  topiramate (TOPAMAX) 200 MG tablet Take 200 mg by mouth daily.    [provider]  traMADol (ULTRAM) 50 MG tablet Take 1 tablet (50 mg total) by mouth every 6 (six) hours as needed. 09/21/17   Domenic Moras, PA-C  TRUEPLUS LANCETS 33G MISC USE TO CHECK BLOOD GLUCOSE QID - AFTER MEALS AND AT BEDTIME 12/22/14   [provider]  valsartan (DIOVAN) 160 MG tablet Take 160 mg by mouth daily.  [provider]  vitamin B-12 (CYANOCOBALAMIN) 500 MCG tablet Take 500 mcg by mouth daily.    [provider]    Physical Exam: Vitals:   06/29/19 1730 06/29/19 2200 06/29/19 2230 06/30/19 0030  BP: 121/74 (!) 136/101 (!) 134/99   Pulse: (!) 104 100 (!) 105 (!) 102  Resp: 18 20 (!) 21 18  Temp: 98.9 F (37.2 C)     TempSrc: Oral     SpO2: 94% 95% 96% 97%     Constitutional:  . Appears calm and comfortable whilst nonmobile.  Patient is obese. Eyes:  . No pallor. No jaundice.  ENMT:  . external ears, nose appear normal Neck:  . Neck is supple. No JVD Respiratory:  . CTA bilaterally, no w/r/r.  . Respiratory effort normal. No retractions or accessory muscle use Cardiovascular:  . S1S2 . No LE extremity edema   Abdomen:  . Abdomen is obese, soft and non tender. Organs are difficult to assess. Neurologic:  . Awake and alert. . Moves all limbs.  Wt Readings from Last 3 Encounters:  06/22/19 92.5 kg  03/27/18 102.1 kg  09/21/17 101.2 kg    I have personally reviewed following labs and imaging studies  Labs on Admission:  CBC: Recent Labs  Lab 06/29/19 1746  WBC 13.2*    HGB 12.5  HCT 38.6  MCV 89.8  PLT 389   Basic Metabolic Panel: Recent Labs  Lab 06/29/19 1746  NA 133*  K 3.6  CL 93*  CO2 25  GLUCOSE 463*  BUN 6  CREATININE 0.81  CALCIUM 8.7*   Liver Function Tests: No results for input(s): AST, ALT, ALKPHOS, BILITOT, PROT, ALBUMIN in the last 168 hours. No results for input(s): LIPASE, AMYLASE in the last 168 hours. No results for input(s): AMMONIA in the last 168 hours. Coagulation Profile: No results for input(s): INR, PROTIME in the last 168 hours. Cardiac Enzymes: No results for input(s): CKTOTAL, CKMB, CKMBINDEX, TROPONINI in the last 168 hours. BNP (last 3 results) No results for input(s): PROBNP in the last 8760 hours. HbA1C: No results for input(s): HGBA1C in the last 72 hours. CBG: No results for input(s): GLUCAP in the last 168 hours. Lipid Profile: No results for input(s): CHOL, HDL, LDLCALC, TRIG, CHOLHDL, LDLDIRECT in the last 72 hours. Thyroid Function Tests: No results for input(s): TSH, T4TOTAL, FREET4, T3FREE, THYROIDAB in the last 72 hours. Anemia Panel: No results for input(s): VITAMINB12, FOLATE, FERRITIN, TIBC, IRON, RETICCTPCT in the last 72 hours. Urine analysis:    Component Value Date/Time   COLORURINE STRAW (A) 06/15/2016 1953   APPEARANCEUR CLEAR 06/15/2016 1953   LABSPEC <=1.005 08/31/2016 1405   PHURINE 5.5 08/31/2016 1405   GLUCOSEU 500 (A) 08/31/2016 1405   HGBUR TRACE (A) 08/31/2016 1405   BILIRUBINUR NEGATIVE 08/31/2016 1405   KETONESUR NEGATIVE 08/31/2016 1405   PROTEINUR NEGATIVE 08/31/2016 1405   UROBILINOGEN 0.2 08/31/2016 1405   NITRITE NEGATIVE 08/31/2016 1405   LEUKOCYTESUR NEGATIVE 08/31/2016 1405   Sepsis Labs: '@LABRCNTIP'$ (procalcitonin:4,lacticidven:4) )No results found for this or any previous visit (from the past 240 hour(s)).    Radiological Exams on Admission: CT Angio Chest PE W and/or Wo Contrast  Result Date: 06/29/2019 CLINICAL DATA:  Shortness of breath EXAM: CT  ANGIOGRAPHY CHEST WITH CONTRAST TECHNIQUE: Multidetector CT imaging of the chest was performed using the standard protocol during bolus administration of intravenous contrast. Multiplanar CT image reconstructions and MIPs were obtained to evaluate the vascular anatomy. CONTRAST:  22m OMNIPAQUE IOHEXOL  350 MG/ML SOLN COMPARISON:  None. FINDINGS: Cardiovascular: Contrast injection is sufficient to demonstrate satisfactory opacification of the pulmonary arteries to the segmental level.There are extensive acute bilateral pulmonary emboli involving the main left pulmonary artery extending into the lobar, segmental, and subsegmental branches bilaterally. There are lobar, segmental, and subsegmental pulmonary emboli involving the right lower lobe. Segmental and subsegmental pulmonary emboli are noted involving the right upper lobe. There is borderline right-sided heart strain with an RV LV ratio measuring approximately 0.9. There is however mild reflux of contrast in the IVC consistent with underlying cardiac dysfunction. Heart size is enlarged. Mediastinum/Nodes: --No mediastinal or hilar lymphadenopathy. --No axillary lymphadenopathy. --No supraclavicular lymphadenopathy. --Normal thyroid gland. --The esophagus is unremarkable Lungs/Pleura: Patchy bilateral ground-glass airspace opacities are noted bilaterally. There is no pneumothorax. No large pleural effusion. The trachea is unremarkable. Upper Abdomen: No acute abnormality. Musculoskeletal: No chest wall abnormality. No acute or significant osseous findings. Review of the MIP images confirms the above findings. IMPRESSION: 1. Extensive bilateral acute pulmonary emboli as detailed above. The RV/LV ratio is borderline measuring 0.9, however there is reflux of contrast in the IVC consistent with right-sided heart strain. 2. Diffuse bilateral ground-glass airspace opacities concerning for an atypical infectious process such as viral pneumonia. These results were called  by telephone at the time of interpretation on 06/29/2019 at 11:22 pm to provider [facility] , who verbally acknowledged these results. Electronically Signed   By: Constance Holster M.D.   On: 06/29/2019 23:26    EKG: Independently reviewed.   Active Problems:   * No active hospital problems. *   Assessment/Plan Acute pulmonary embolism, extensive with right heart strain/pneumonia secondary to SARS-CoV-2: -Admit patient -Start patient on heparin -Vascular ultrasound of the lower extremities -Patient has been ill with Covid for about 2 weeks, however, will still start patient on dexamethasone and remdesivir. -Consult critical care team -Echocardiogram -Avoid Depo-Provera (likely discontinue if patient is actually on Depo-Provera) -Further management will depend on hospital course  Diabetes mellitus, uncontrolled: -Subacute Lantus 20 units twice daily -Sliding scale insulin coverage -Patient's blood sugar may deteriorate significantly while on steroids.  Please monitor closely  Hypertension: Continue to optimize. Would not aim to drop blood pressure significantly for now due to extensive PE Monitor closely  Obesity/OSA: Further management of obesity on outpatient basis Diet and exercise  DVT prophylaxis: Heparin drip Code Status: Full code Family Communication:  Disposition Plan: To depend on hospital course Consults called: Critical care/pulmonary Admission status: Inpatient  Time spent: 65 minutes  Dana Allan, MD  [facility] Pager #: [phone] 7PM-7AM contact night coverage as above  06/30/2019, 1:11 AM

## 2019-06-30 NOTE — ED Notes (Signed)
Pt reports relief of CP

## 2019-06-30 NOTE — ED Notes (Signed)
Pt transported to CT ?

## 2019-06-30 NOTE — Progress Notes (Signed)
ANTICOAGULATION CONSULT NOTE  Pharmacy Consult for Heparin  Indication: pulmonary embolus  Allergies  Allergen Reactions  . Ibuprofen   . Ketorolac    Vital Signs: Temp: 98.7 F (37.1 C) (02/03 1800) Temp Source: Oral (02/03 1800) BP: 155/85 (02/03 1800) Pulse Rate: 108 (02/03 1800)  Labs: Recent Labs    06/29/19 1746 06/29/19 1746 06/30/19 0105 06/30/19 0200 06/30/19 0613 06/30/19 0830 06/30/19 1756  HGB 12.5  --   --   --   --   --   --   HCT 38.6  --   --   --   --   --   --   PLT 367  --   --   --   --   --   --   HEPARINUNFRC  --   --   --   --   --  <0.10* 0.69  CREATININE 0.81  --   --  0.77  --   --   --   TROPONINIHS 10   < > 15 13 9   --   --    < > = values in this interval not displayed.    Estimated Creatinine Clearance: 97.9 mL/min (by C-G formula based on SCr of 0.77 mg/dL).  Assessment: 39 yr old female with new onset PE in the setting of COVID-19 infection.   Heparin level ~6.5 hrs after heparin 4000 units IV bolus X 1, followed by increasing heparin infusion to 1500 units/hr, is 0.69 units/ml, which is at the upper end of the goal range for this patient. CBC WNL. Per RN, no issues with IV or bleeding observed.   Goal of Therapy:  Heparin level 0.3-0.7 units/ml Monitor platelets by anticoagulation protocol: Yes   Plan:  Continue heparin infusion at 1500 units/hr Check confirmatory heparin level in 6 hours Monitor daily heparin level, CBC Monitor for signs/symptoms of bleeding  24, PharmD, BCPS, [facility] 06/30/2019 6:45 PM

## 2019-06-30 NOTE — Progress Notes (Signed)
Lower extremity venous has been completed.   Preliminary results in CV Proc.   Blanch Media 06/30/2019 11:22 AM

## 2019-06-30 NOTE — Progress Notes (Signed)
PROGRESS NOTE                                                                                                                                                                                                             Patient Demographics:    Shelly Shields, is a 39 y.o. female, DOB - [birth year], RDE:[id number]  Outpatient Primary MD for the patient is [name], MD    LOS - 0  Admit date - 06/29/2019    Chief Complaint  Patient presents with  . COVID  . Shortness of Breath  . Cough       Brief Narrative  39 year old female, obese, with past medical history significant for OSA, diabetes mellitus, hypertension and migraine.  Apparently, patient was diagnosed with Covid on June 11, 2019.  Patient has been managed symptomatically at home, with reports of fever, viral syndrome shortness of breath, her work-up in the ER was suggestive of COVID-19 pneumonia along with bilateral PE.  She also had been undergoing treatment for right gluteal infection for the last several days and was on doxycycline.  She was admitted to the hospital for further care.   Subjective:    Shelly Shields today has, No headache, No chest pain, No abdominal pain - No Nausea, No new weakness tingling or numbness, no SOB.   Assessment  & Plan :     1. Acute Hypoxic Resp. Failure due to Acute Covid 19 Viral Pneumonitis during the ongoing 2020 Covid 19 Pandemic - she seems to have mild Covid disease and pneumonia, currently stable on room air, continue low-dose steroids and IV remdesivir for now.  Her main issue seems to be PE for which she is getting IV heparin.  Echo pending to assess for right heart strain but clinically seems absolutely stable.  Encouraged the patient to sit up in chair in the daytime use I-S and flutter valve for pulmonary toiletry and then prone in bed when at night.    SpO2: 95 %  Recent Labs  Lab 06/30/19 0200  CRP 17.5*   DDIMER 5.54*  FERRITIN 413*  PROCALCITON 0.15    Hepatic Function Latest Ref Rng & Units 06/30/2019 06/22/2019 08/31/2016  Total Protein 6.5 - 8.1 g/dL 7.6 7.5 6.7  Albumin 3.5 - 5.0 g/dL 2.4(L) 2.6(L) 3.3(L)  AST 15 - 41 U/L 17 23  24  ALT 0 - 44 U/L 17 32 26  Alk Phosphatase 38 - 126 U/L 99 97 131(H)  Total Bilirubin 0.3 - 1.2 mg/dL 1.3(H) 0.8 0.5    2.  Bilateral PE.  Likely due to inflammation caused by COVID-19 infection, lack of activity and patient being on Depo shots.  Currently on IV heparin drip, will discontinue Depo shots at the time of discharge.  Lower extremity venous duplex unremarkable.  3.  Obesity OSA.  Follow with PCP, nighttime oxygen if needed.  4.  DM type II.  Placed on Lantus twice daily at home dose along with premeal NovoLog and high-dose sliding scale.  Will monitor and adjust.  A1c suggests extremely poor outpatient glycemic control due to hyperglycemia.  Lab Results  Component Value Date   HGBA1C 11.7 (H) 06/30/2019    CBG (last 3)  Recent Labs    06/30/19 0819 06/30/19 1113 06/30/19 1233  GLUCAP 378* 377* 449*       Condition - Fair  Family Communication  :  None  Code Status :  Full  Diet :   Diet Order            Diet heart healthy/carb modified Room service appropriate? Yes; Fluid consistency: Thin  Diet effective now               Disposition Plan  : Home in 2 to 3 days, currently on heparin drip and IV remdesivir for PE and acute Covid infection.  Consults  : None  Procedures  :    CT angiogram chest.  Bilateral PE  Lower extremity venous ultrasound.  No DVT  CT of pelvis.  Right gluteal induration without any fluid collection or abscess.  PUD Prophylaxis :   DVT Prophylaxis  :    Heparin gtt  Lab Results  Component Value Date   PLT 367 06/29/2019    Inpatient Medications  Scheduled Meds: . vitamin C  500 mg Oral Daily  . [START ON 07/01/2019] dexamethasone (DECADRON) injection  4 mg Intravenous Q24H  .  doxycycline  100 mg Oral BID  . folic acid  1 mg Oral Daily  . insulin aspart  0-20 Units Subcutaneous TID WC  . insulin aspart  0-5 Units Subcutaneous QHS  . insulin aspart  3 Units Subcutaneous TID WC  . insulin glargine  80 Units Subcutaneous BID  . Ipratropium-Albuterol  1 puff Inhalation Q6H  . multivitamin with minerals  1 tablet Oral Daily  . [START ON 07/01/2019] rosuvastatin  10 mg Oral Once per day on Mon Thu  . topiramate  200 mg Oral QHS  . zinc sulfate  220 mg Oral Daily   Continuous Infusions: . heparin 1,500 Units/hr (06/30/19 1121)  . lactated ringers 75 mL/hr at 06/30/19 1121  . [START ON 07/01/2019] remdesivir 100 mg in NS 100 mL     PRN Meds:.iohexol, SUMAtriptan  Antibiotics  :    Anti-infectives (From admission, onward)   Start     Dose/Rate Route Frequency Ordered Stop   07/01/19 1000  remdesivir 100 mg in sodium chloride 0.9 % 100 mL IVPB  Status:  Discontinued     100 mg 200 mL/hr over 30 Minutes Intravenous Daily 06/30/19 0318 06/30/19 0402   07/01/19 1000  remdesivir 100 mg in sodium chloride 0.9 % 100 mL IVPB     100 mg 200 mL/hr over 30 Minutes Intravenous Daily 06/30/19 0128 07/05/19 0959   06/30/19 0400  remdesivir 200 mg in sodium  chloride 0.9% 250 mL IVPB  Status:  Discontinued     200 mg 580 mL/hr over 30 Minutes Intravenous Once 06/30/19 0318 06/30/19 0402   06/30/19 0330  doxycycline (VIBRA-TABS) tablet 100 mg     100 mg Oral 2 times daily 06/30/19 0318     06/30/19 0200  remdesivir 200 mg in sodium chloride 0.9% 250 mL IVPB     200 mg 580 mL/hr over 30 Minutes Intravenous Once 06/30/19 0128 06/30/19 0317       Time Spent in minutes  Kleberg M.D on 06/30/2019 at 1:58 PM  To page go to [email or web address] - password [facility]  [facility] -  Office  [phone]    See all Orders from today for further details    Objective:   Vitals:   06/30/19 0700 06/30/19 0800 06/30/19 1000 06/30/19 1100  BP: (!) 142/98 (!) 138/96 136/89  (!) 140/96  Pulse: 96 98 (!) 102 (!) 106  Resp: (!) 32 18 (!) 27 (!) 28  Temp:      TempSrc:      SpO2: 90% 92% 95% 95%    Wt Readings from Last 3 Encounters:  06/22/19 92.5 kg  03/27/18 102.1 kg  09/21/17 101.2 kg    No intake or output data in the 24 hours ending 06/30/19 1358   Physical Exam  Awake Alert,   No new F.N deficits, Normal affect [place].AT,PERRAL Supple Neck,No JVD, No cervical lymphadenopathy appriciated.  Symmetrical Chest wall movement, Good air movement bilaterally, CTAB RRR,No Gallops,Rubs or new Murmurs, No Parasternal Heave +ve B.Sounds, Abd Soft, No tenderness, No organomegaly appriciated, No rebound - guarding or rigidity. R Gluteal inner aspect has small induration and an open sore, no pus was present to be extracted. No Cyanosis, Clubbing or edema, No new Rash or bruise       Data Review:    CBC Recent Labs  Lab 06/29/19 1746  WBC 13.2*  HGB 12.5  HCT 38.6  PLT 367  MCV 89.8  MCH 29.1  MCHC 32.4  RDW 13.1    Chemistries  Recent Labs  Lab 06/29/19 1746 06/30/19 0200  NA 133* 138  K 3.6 3.8  CL 93* 97*  CO2 25 24  GLUCOSE 463* 324*  BUN 6 6  CREATININE 0.81 0.77  CALCIUM 8.7* 8.9  AST  --  17  ALT  --  17  ALKPHOS  --  99  BILITOT  --  1.3*   ------------------------------------------------------------------------------------------------------------------ No results for input(s): CHOL, HDL, LDLCALC, TRIG, CHOLHDL, LDLDIRECT in the last 72 hours.  Lab Results  Component Value Date   HGBA1C 11.7 (H) 06/30/2019   ------------------------------------------------------------------------------------------------------------------ No results for input(s): TSH, T4TOTAL, T3FREE, THYROIDAB in the last 72 hours.  Invalid input(s): FREET3  Cardiac Enzymes No results for input(s): CKMB, TROPONINI, MYOGLOBIN in the last 168 hours.  Invalid input(s):  CK ------------------------------------------------------------------------------------------------------------------ No results found for: BNP  Micro Results No results found for this or any previous visit (from the past 240 hour(s)).  Radiology Reports CT Angio Chest PE W and/or Wo Contrast  Result Date: 06/29/2019 CLINICAL DATA:  Shortness of breath EXAM: CT ANGIOGRAPHY CHEST WITH CONTRAST TECHNIQUE: Multidetector CT imaging of the chest was performed using the standard protocol during bolus administration of intravenous contrast. Multiplanar CT image reconstructions and MIPs were obtained to evaluate the vascular anatomy. CONTRAST:  46m OMNIPAQUE IOHEXOL 350 MG/ML SOLN COMPARISON:  None. FINDINGS: Cardiovascular: Contrast injection is sufficient to demonstrate satisfactory opacification of  the pulmonary arteries to the segmental level.There are extensive acute bilateral pulmonary emboli involving the main left pulmonary artery extending into the lobar, segmental, and subsegmental branches bilaterally. There are lobar, segmental, and subsegmental pulmonary emboli involving the right lower lobe. Segmental and subsegmental pulmonary emboli are noted involving the right upper lobe. There is borderline right-sided heart strain with an RV LV ratio measuring approximately 0.9. There is however mild reflux of contrast in the IVC consistent with underlying cardiac dysfunction. Heart size is enlarged. Mediastinum/Nodes: --No mediastinal or hilar lymphadenopathy. --No axillary lymphadenopathy. --No supraclavicular lymphadenopathy. --Normal thyroid gland. --The esophagus is unremarkable Lungs/Pleura: Patchy bilateral ground-glass airspace opacities are noted bilaterally. There is no pneumothorax. No large pleural effusion. The trachea is unremarkable. Upper Abdomen: No acute abnormality. Musculoskeletal: No chest wall abnormality. No acute or significant osseous findings. Review of the MIP images confirms the  above findings. IMPRESSION: 1. Extensive bilateral acute pulmonary emboli as detailed above. The RV/LV ratio is borderline measuring 0.9, however there is reflux of contrast in the IVC consistent with right-sided heart strain. 2. Diffuse bilateral ground-glass airspace opacities concerning for an atypical infectious process such as viral pneumonia. These results were called by telephone at the time of interpretation on 06/29/2019 at 11:22 pm to provider [facility] , who verbally acknowledged these results. Electronically Signed   By: Constance Holster M.D.   On: 06/29/2019 23:26   CT PELVIS W CONTRAST  Result Date: 06/30/2019 CLINICAL DATA:  Right gluteal inflammation on prior CT, abscess EXAM: CT PELVIS WITH CONTRAST TECHNIQUE: Multidetector CT imaging of the pelvis was performed using the standard protocol following the bolus administration of intravenous contrast. CONTRAST:  132m OMNIPAQUE IOHEXOL 300 MG/ML  SOLN COMPARISON:  06/22/2019 FINDINGS: Urinary Tract: There is excreted contrast filling the urinary bladder without filling defect. Distal ureters are unremarkable. Bowel:  No bowel obstruction or ileus. Vascular/Lymphatic: No pathologically enlarged lymph nodes. No significant vascular abnormality seen. Reproductive:  No mass or other significant abnormality Other: Inflammatory changes seen within the medial inferior aspect of the right gluteal fold are again identified. There is no fluid collection or formed abscess. Musculoskeletal: No acute or destructive bony lesions. Reconstructed images demonstrate no additional findings. IMPRESSION: 1. Inflammatory changes within the medial inferior aspect of the right gluteal fold consistent with cellulitis. No fluid collection or formed abscess. Electronically Signed   By: MRanda NgoM.D.   On: 06/30/2019 11:13   CT ABDOMEN PELVIS W CONTRAST  Result Date: 06/22/2019 CLINICAL DATA:  Abscess to right buttock with drainage. EXAM: CT ABDOMEN AND PELVIS WITH  CONTRAST TECHNIQUE: Multidetector CT imaging of the abdomen and pelvis was performed using the standard protocol following bolus administration of intravenous contrast. CONTRAST:  1065mOMNIPAQUE IOHEXOL 300 MG/ML  SOLN COMPARISON:  January 12, 2016 FINDINGS: Lower chest: Mild to moderate severity patchy infiltrates are seen throughout both lung bases. Hepatobiliary: No focal liver abnormality is seen. No gallstones, gallbladder wall thickening, or biliary dilatation. Pancreas: Unremarkable. No pancreatic ductal dilatation or surrounding inflammatory changes. Spleen: Normal in size without focal abnormality. Adrenals/Urinary Tract: Adrenal glands are unremarkable. Kidneys are normal, without renal calculi, focal lesion, or hydronephrosis. Bladder is unremarkable. Stomach/Bowel: Stomach is within normal limits. Appendix appears normal. No evidence of bowel wall thickening, distention, or inflammatory changes. Vascular/Lymphatic: Reproductive: Uterus and bilateral adnexa are unremarkable. Other: No abdominal wall hernia or abnormality. No abdominopelvic ascites. Musculoskeletal: Mild diffuse inflammatory fat stranding is seen along the gluteal region on the right. There is no evidence  of associated fluid collection or abscess. No acute or significant osseous findings. IMPRESSION: 1. Mild inflammation involving the gluteal region on the right, without visualization of an associated fluid collection or abscess. 2. Mild to moderate severity patchy bibasilar infiltrates. Electronically Signed   By: Virgina Norfolk M.D.   On: 06/22/2019 22:40   VAS [place] LOWER EXTREMITY VENOUS (DVT)  Result Date: 06/30/2019  Lower Venous DVTStudy Indications: Pulmonary embolism.  Comparison Study: no prior Performing Technologist: Abram Sander RVS  Examination Guidelines: A complete evaluation includes B-mode imaging, spectral Doppler, color Doppler, and power Doppler as needed of all accessible portions of each vessel. Bilateral testing  is considered an integral part of a complete examination. Limited examinations for reoccurring indications may be performed as noted. The reflux portion of the exam is performed with the patient in reverse Trendelenburg.  +---------+---------------+---------+-----------+----------+--------------+ RIGHT    CompressibilityPhasicitySpontaneityPropertiesThrombus Aging +---------+---------------+---------+-----------+----------+--------------+ CFV      Full           Yes      Yes                                 +---------+---------------+---------+-----------+----------+--------------+ SFJ      Full                                                        +---------+---------------+---------+-----------+----------+--------------+ FV Prox  Full                                                        +---------+---------------+---------+-----------+----------+--------------+ FV Mid   Full                                                        +---------+---------------+---------+-----------+----------+--------------+ FV DistalFull                                                        +---------+---------------+---------+-----------+----------+--------------+ PFV      Full                                                        +---------+---------------+---------+-----------+----------+--------------+ POP      Full           Yes      Yes                                 +---------+---------------+---------+-----------+----------+--------------+ PTV      Full                                                        +---------+---------------+---------+-----------+----------+--------------+  PERO     Full                                                        +---------+---------------+---------+-----------+----------+--------------+   +---------+---------------+---------+-----------+----------+--------------+ LEFT      CompressibilityPhasicitySpontaneityPropertiesThrombus Aging +---------+---------------+---------+-----------+----------+--------------+ CFV      Full           Yes      Yes                                 +---------+---------------+---------+-----------+----------+--------------+ SFJ      Full                                                        +---------+---------------+---------+-----------+----------+--------------+ FV Prox  Full                                                        +---------+---------------+---------+-----------+----------+--------------+ FV Mid   Full                                                        +---------+---------------+---------+-----------+----------+--------------+ FV DistalFull                                                        +---------+---------------+---------+-----------+----------+--------------+ PFV      Full                                                        +---------+---------------+---------+-----------+----------+--------------+ POP      Full           Yes      Yes                                 +---------+---------------+---------+-----------+----------+--------------+ PTV      Full                                                        +---------+---------------+---------+-----------+----------+--------------+ PERO     Full                                                        +---------+---------------+---------+-----------+----------+--------------+       Summary: BILATERAL: - No evidence of deep vein thrombosis seen in the lower extremities, bilaterally.   *See table(s) above for measurements and observations.    Preliminary

## 2019-06-30 NOTE — Progress Notes (Signed)
  Echocardiogram 2D Echocardiogram has been performed.  Shelly Shields 06/30/2019, 4:37 PM

## 2019-06-30 NOTE — ED Notes (Signed)
PureWick placed.

## 2019-06-30 NOTE — Progress Notes (Signed)
Shelly Shields [id number] Admission Data: 06/30/2019 6:27 PM Attending Provider: Leroy Sea, MD  QHQ:[id number], Lorin Picket, MD Consults/ Treatment Team:   [name] is a 39 y.o. female patient admitted from ED awake, alert  & orientated  X 3,  Prior, VSS - Blood pressure (!) 155/85, pulse (!) 108, temperature 98.7 F (37.1 C), temperature source Oral, resp. rate (!) 24, height 5\' 1"  (1.549 m), SpO2 92 %., O2  On room air, no c/o shortness of breath, no c/o chest pain, no distress noted. Tele # [id number] placed and pt is currently running:sinus tachycardia.  Pt orientation to unit, room and routine. Information packet given to patient/family and safety video watched.  Admission INP armband ID verified with patient/family, and in place. SR up x 2, fall risk assessment complete with Patient and family verbalizing understanding of risks associated with falls. Pt verbalizes an understanding of how to use the call bell and to call for help before getting out of bed.  Skin, clean-dry- intact without evidence of bruising, or skin tears. R gluteal abscess noted, foam dressing placed as mild drainage noted.   Will cont to monitor and assist as needed.  , RN 06/30/2019 6:27 PM

## 2019-06-30 NOTE — Progress Notes (Signed)
ANTICOAGULATION CONSULT NOTE - Initial Consult  Pharmacy Consult for Heparin  Indication: pulmonary embolus  Allergies  Allergen Reactions  . Ibuprofen   . Ketorolac      Vital Signs: Temp: 98.9 F (37.2 C) (02/02 1730) Temp Source: Oral (02/02 1730) BP: 121/74 (02/02 1730) Pulse Rate: 104 (02/02 1730)  Labs: Recent Labs    06/29/19 1746  HGB 12.5  HCT 38.6  PLT 367  CREATININE 0.81  TROPONINIHS 10    Estimated Creatinine Clearance: 96.7 mL/min (by C-G formula based on SCr of 0.81 mg/dL).   Medical History: Past Medical History:  Diagnosis Date  . Asthma   . Diabetes mellitus without complication (HCC)   . Hypertension   . Migraines   . Obesity   . Sleep apnea    Assessment: 39 y/o F with new onset PE in the setting of COVID-19 infection. To begin heparin. CBC/renal function good. PTA meds reviewed.   Goal of Therapy:  Heparin level 0.3-0.7 units/ml Monitor platelets by anticoagulation protocol: Yes   Plan:  Heparin 5000 units BOLUS Start heparin drip at 1200 units/hr 0700 HL Daily CBC/HL Monitor for bleeding  Abran Duke, PharmD, BCPS Clinical Pharmacist Phone: [phone]

## 2019-06-30 NOTE — Consult Note (Signed)
NAME:  Shelly Shields, MRN:  [id number], DOB:  03/29/1981, LOS: 0 ADMISSION DATE:  06/29/2019, CONSULTATION DATE:  06/30/19 REFERRING MD:  Ralene Bathe  CHIEF COMPLAINT:  Dyspnea   Brief History   Shelly Shields is a 39 y.o. female who was admitted by [facility] 2/3 with bilateral PE.   History of present illness   Shelly Shields is a 39 y.o. female who has a PMH as outlined below.  She presented to [facility] ED 2/2 with dyspnea x 18 days.  She was diagnosed with COVID on 06/21/19. Did OK with it and never needed admission or O2.  Since then has had productive cough (started as dry).  Dyspnea gradually worsened.  Went to PCP and had sats in low 90's so was sent to ED.  In ED, had CTA that demonstrated extensive bilateral PE L > R.  She remained hemodynamically stable and on room air.  No prior hx of VTE, no hx malignancies, no recent long trips / periods of prolonged immobilization, hemoptysis, LE edema.  She does state she has had 1 or 2 family members who passed away from PE, but otherwise, no known family hx of hypercoagulable states.  She is on depo provera contraceptive.  No hx of tobacco use.  She works at [employer] and is on her feet for most of the day.  Past Medical History  has DKA (diabetic ketoacidoses) ([place]); Diabetes mellitus, new onset ([place]); Hyperglycemic hyperosmolar nonketotic coma ([place]); Essential hypertension; Asthma; Hypernatremia; and Asthma, chronic on their problem list.  Significant Hospital Events   2/3 > admit.  Consults:  PCCM.  Procedures:  None.  Significant Diagnostic Tests:  CTA chest 2/2 > extensive PE L > R.  RV / LV = 0.9. Echo 2/3 >  LE duplex 2/3 >   Micro Data:  None.  Antimicrobials:  None.   Interim history/subjective:  Comfortable on room air.  Objective:  Blood pressure (!) 134/99, pulse (!) 102, temperature 98.9 F (37.2 C), temperature source Oral, resp. rate 18, SpO2 97 %.       No intake or output data in the 24 hours ending 06/30/19  0059 There were no vitals filed for this visit.  Examination: General: Adult female, in NAD. Neuro: A&O x 3, no deficits. HEENT: [place]/AT. Sclerae anicteric.  EOMI. Cardiovascular: RRR, no M/R/G.  Lungs: Respirations even and unlabored.  CTA bilaterally, No W/R/R.  Abdomen: BS x 4, soft, NT/ND.  Musculoskeletal: No gross deformities, no edema.  Skin: Intact, warm, no rashes.  Assessment & Plan:   Bilateral PE - presumed from hypercoagulable state 2/2 COVID diagnosis.  HD stable on room air. - Continue heparin gtt. - Transition to DOAC vs warfarin.  Would do at least 6 months though consider lifelong given family hx of VTE. - No role for EKOS or systemic lytics. - Assess echo and LE duplex. - F/u with OB / GYN for discussion on contraceptive plan (currently on depo provera).  Rest per primary team.  Nothing further to add.  PCCM will sign off.  Please do not hesitate to call us back if we can be of any further assistance.   Best Practice:  Diet: Per primary. Pain/Anxiety/Delirium protocol (if indicated): N/A. VAP protocol (if indicated): N/A. DVT prophylaxis: Heparin gtt. GI prophylaxis: N/A. Glucose control: Per primary. Mobility: Bedrest. Code Status: Full. Family Communication: None. Disposition: Progressive.  Labs   CBC: Recent Labs  Lab 06/29/19 1746  WBC 13.2*  HGB 12.5  HCT  38.6  MCV 89.8  PLT 585   Basic Metabolic Panel: Recent Labs  Lab 06/29/19 1746  NA 133*  K 3.6  CL 93*  CO2 25  GLUCOSE 463*  BUN 6  CREATININE 0.81  CALCIUM 8.7*   GFR: Estimated Creatinine Clearance: 96.7 mL/min (by C-G formula based on SCr of 0.81 mg/dL). Recent Labs  Lab 06/29/19 1746  WBC 13.2*   Liver Function Tests: No results for input(s): AST, ALT, ALKPHOS, BILITOT, PROT, ALBUMIN in the last 168 hours. No results for input(s): LIPASE, AMYLASE in the last 168 hours. No results for input(s): AMMONIA in the last 168 hours. ABG    Component Value Date/Time   HCO3  21.0 11/27/2014 1550   TCO2 22 11/27/2014 1550   ACIDBASEDEF 5.0 (H) 11/27/2014 1550   O2SAT 64.0 11/27/2014 1550    Coagulation Profile: No results for input(s): INR, PROTIME in the last 168 hours. Cardiac Enzymes: No results for input(s): CKTOTAL, CKMB, CKMBINDEX, TROPONINI in the last 168 hours. HbA1C: Hgb A1c MFr Bld  Date/Time Value Ref Range Status  11/27/2014 07:00 PM 11.6 (H) 4.8 - 5.6 % Final    Comment:    (NOTE)         Pre-diabetes: 5.7 - 6.4         Diabetes: >6.4         Glycemic control for adults with diabetes: <7.0    CBG: No results for input(s): GLUCAP in the last 168 hours.  Review of Systems:   All negative; except for those that are bolded, which indicate positives.  Constitutional: weight loss, weight gain, night sweats, fevers, chills, fatigue, weakness.  HEENT: headaches, sore throat, sneezing, nasal congestion, post nasal drip, difficulty swallowing, tooth/dental problems, visual complaints, visual changes, ear aches. Neuro: difficulty with speech, weakness, numbness, ataxia. CV:  chest pain, orthopnea, PND, swelling in lower extremities, dizziness, palpitations, syncope.  Resp: cough, hemoptysis, dyspnea, wheezing. GI: heartburn, indigestion, abdominal pain, nausea, vomiting, diarrhea, constipation, change in bowel habits, loss of appetite, hematemesis, melena, hematochezia.  GU: dysuria, change in color of urine, urgency or frequency, flank pain, hematuria. MSK: joint pain or swelling, decreased range of motion. Psych: change in mood or affect, depression, anxiety, suicidal ideations, homicidal ideations. Skin: rash, itching, bruising.   Past medical history  She,  has a past medical history of Asthma, Diabetes mellitus without complication ([place]), Hypertension, Migraines, Obesity, and Sleep apnea.   Surgical History    Past Surgical History:  Procedure Laterality Date  . BILATERAL CARPAL TUNNEL RELEASE       Social History   reports that  she has never smoked. She has never used smokeless tobacco. She reports current alcohol use. She reports that she does not use drugs.   Family history   Her family history includes Diabetes in her father; Hypertension in her father and mother; Sleep apnea in her father; Stroke in her father; Thyroid disease in her mother.   Allergies Allergies  Allergen Reactions  . Ibuprofen   . Ketorolac      Home meds  Prior to Admission medications   Medication Sig Start Date End Date Taking? Authorizing Provider  doxycycline (VIBRAMYCIN) 100 MG capsule Take 1 capsule (100 mg total) by mouth 2 (two) times daily. 06/22/19  Yes Joy, Shawn C, PA-C  Insulin Glargine (LANTUS) 100 UNIT/ML Solostar Pen Inject 35 Units into the skin daily. Patient taking differently: Inject 80 Units into the skin 2 (two) times daily.  11/29/14  Yes Beverlyn Roux  M, MD  insulin lispro protamine-lispro (HUMALOG 75/25 MIX) (75-25) 100 UNIT/ML SUSP injection Inject 50 Units into the skin 2 (two) times daily with a meal.    Yes [provider]  omeprazole (PRILOSEC) 40 MG capsule Take 40 mg by mouth daily.   Yes [provider]  VENTOLIN HFA 108 (90 BASE) MCG/ACT inhaler Use as directed 2 puffs in the mouth or throat 4 (four) times daily as needed. 12/21/14  Yes [provider]  Blood Glucose Monitoring Suppl (ACCU-CHEK NANO SMARTVIEW) W/DEVICE KIT 1 Device by Does not apply route 4 (four) times daily - after meals and at bedtime. 11/29/14   Frazier Richards, MD  canagliflozin (INVOKANA) 300 MG TABS tablet Take 300 mg by mouth daily before breakfast.    [provider]  clotrimazole (LOTRIMIN) 1 % cream Apply to affected area 2 times daily 06/16/16   Everlene Balls, MD  escitalopram (LEXAPRO) 10 MG tablet Take 10 mg by mouth daily.    [provider]  fexofenadine (ALLEGRA) 180 MG tablet Take 180 mg by mouth daily.    [provider]  fluticasone (FLONASE) 50 MCG/ACT nasal spray Place 2  sprays into both nostrils daily as needed for allergies.  09/22/14   [provider]  fluticasone (FLOVENT HFA) 110 MCG/ACT inhaler Inhale into the lungs 2 (two) times daily.    [provider]  glucose blood (ACCU-CHEK SMARTVIEW) test strip Check sugar 6 x daily Patient taking differently: Check sugar 6 x daily. Uses true test instead 11/29/14   Frazier Richards, MD  HYDROcodone-acetaminophen (NORCO/VICODIN) 5-325 MG tablet Take 1-2 tablets by mouth every 6 (six) hours as needed for severe pain. 06/22/19   Joy, Shawn C, PA-C  Insulin Pen Needle (INSUPEN PEN NEEDLES) 32G X 4 MM MISC BD Pen Needles- brand specific. Inject insulin via insulin pen daily 11/29/14   Frazier Richards, MD  ketotifen (ZADITOR) 0.025 % ophthalmic solution Place 1 drop into both eyes daily as needed (dry eyes).    [provider]  Lancet Devices [facility]) lancets Use as instructed for blood glucose checks four times daily, before meals and at bedtime 11/29/14   Frazier Richards, MD  losartan (COZAAR) 25 MG tablet Take 25 mg by mouth daily.    [provider]  medroxyPROGESTERone (DEPO-PROVERA) 150 MG/ML injection Inject 150 mg into the muscle every 3 (three) months. 09/26/14   [provider]  metFORMIN (GLUCOPHAGE-XR) 500 MG 24 hr tablet TK 2 TS PO DAILY 12/20/14   [provider]  ondansetron (ZOFRAN ODT) 4 MG disintegrating tablet Take 1 tablet (4 mg total) by mouth every 8 (eight) hours as needed for nausea or vomiting. 01/29/16   Charlann Lange, PA-C  ondansetron (ZOFRAN) 4 MG tablet Take 1 tablet (4 mg total) by mouth every 6 (six) hours. 01/13/16   Isla Pence, MD  promethazine (PHENERGAN) 25 MG tablet Take 25 mg by mouth every 6 (six) hours as needed for nausea or vomiting.    [provider]  pseudoephedrine (SUDAFED) 120 MG 12 hr tablet Take 120 mg by mouth daily as needed for congestion.    [provider]  SUMAtriptan (IMITREX) 100 MG tablet Take 1  tablet (100 mg total) by mouth every 2 (two) hours as needed for migraine or headache. 11/29/14   Frazier Richards, MD  topiramate (TOPAMAX) 200 MG tablet Take 200 mg by mouth daily.    [provider]  traMADol (ULTRAM) 50 MG  tablet Take 1 tablet (50 mg total) by mouth every 6 (six) hours as needed. 09/21/17   Domenic Moras, PA-C  TRUEPLUS LANCETS 33G MISC USE TO CHECK BLOOD GLUCOSE QID - AFTER MEALS AND AT BEDTIME 12/22/14   [provider]  valsartan (DIOVAN) 160 MG tablet Take 160 mg by mouth daily.    [provider]  vitamin B-12 (CYANOCOBALAMIN) 500 MCG tablet Take 500 mcg by mouth daily.    [provider]     Montey Hora, [facility] 06/30/2019, 12:59 AM

## 2019-06-30 NOTE — ED Notes (Signed)
Pt resting in bed. Pt denies new or worsening complaints. Will continue to monitor. No distress noted. Pt on continuous monitoring via blood pressure, pulse ox, and cardiac monitor.  

## 2019-07-01 LAB — CBC WITH DIFFERENTIAL/PLATELET
Abs Immature Granulocytes: 0.09 10*3/uL — ABNORMAL HIGH (ref 0.00–0.07)
Basophils Absolute: 0 10*3/uL (ref 0.0–0.1)
Basophils Relative: 0 %
Eosinophils Absolute: 0 10*3/uL (ref 0.0–0.5)
Eosinophils Relative: 0 %
HCT: 34.9 % — ABNORMAL LOW (ref 36.0–46.0)
Hemoglobin: 11.3 g/dL — ABNORMAL LOW (ref 12.0–15.0)
Immature Granulocytes: 1 %
Lymphocytes Relative: 22 %
Lymphs Abs: 3.6 10*3/uL (ref 0.7–4.0)
MCH: 28.8 pg (ref 26.0–34.0)
MCHC: 32.4 g/dL (ref 30.0–36.0)
MCV: 88.8 fL (ref 80.0–100.0)
Monocytes Absolute: 1.1 10*3/uL — ABNORMAL HIGH (ref 0.1–1.0)
Monocytes Relative: 7 %
Neutro Abs: 11.4 10*3/uL — ABNORMAL HIGH (ref 1.7–7.7)
Neutrophils Relative %: 70 %
Platelets: 390 10*3/uL (ref 150–400)
RBC: 3.93 MIL/uL (ref 3.87–5.11)
RDW: 13.2 % (ref 11.5–15.5)
WBC: 16.3 10*3/uL — ABNORMAL HIGH (ref 4.0–10.5)
nRBC: 0 % (ref 0.0–0.2)

## 2019-07-01 LAB — GLUCOSE, CAPILLARY
Glucose-Capillary: 320 mg/dL — ABNORMAL HIGH (ref 70–99)
Glucose-Capillary: 105 mg/dL — ABNORMAL HIGH (ref 70–99)
Glucose-Capillary: 108 mg/dL — ABNORMAL HIGH (ref 70–99)
Glucose-Capillary: 253 mg/dL — ABNORMAL HIGH (ref 70–99)
Glucose-Capillary: 255 mg/dL — ABNORMAL HIGH (ref 70–99)

## 2019-07-01 LAB — C-REACTIVE PROTEIN: CRP: 12 mg/dL — ABNORMAL HIGH (ref <1.0)

## 2019-07-01 LAB — COMPREHENSIVE METABOLIC PANEL
ALT: 18 U/L (ref 0–44)
AST: 19 U/L (ref 15–41)
Albumin: 2.1 g/dL — ABNORMAL LOW (ref 3.5–5.0)
Alkaline Phosphatase: 80 U/L (ref 38–126)
Anion gap: 13 (ref 5–15)
BUN: 12 mg/dL (ref 6–20)
CO2: 23 mmol/L (ref 22–32)
Calcium: 9 mg/dL (ref 8.9–10.3)
Chloride: 101 mmol/L (ref 98–111)
Creatinine, Ser: 0.77 mg/dL (ref 0.44–1.00)
GFR calc Af Amer: >60 mL/min (ref >60)
GFR calc non Af Amer: >60 mL/min (ref >60)
Glucose, Bld: 195 mg/dL — ABNORMAL HIGH (ref 70–99)
Potassium: 3.3 mmol/L — ABNORMAL LOW (ref 3.5–5.1)
Sodium: 137 mmol/L (ref 135–145)
Total Bilirubin: 0.9 mg/dL (ref 0.3–1.2)
Total Protein: 6.4 g/dL — ABNORMAL LOW (ref 6.5–8.1)

## 2019-07-01 LAB — BRAIN NATRIURETIC PEPTIDE: B Natriuretic Peptide: 66.4 pg/mL (ref 0.0–100.0)

## 2019-07-01 LAB — HEPARIN LEVEL (UNFRACTIONATED): Heparin Unfractionated: 0.17 IU/mL — ABNORMAL LOW (ref 0.30–0.70)

## 2019-07-01 LAB — MAGNESIUM: Magnesium: 1.7 mg/dL (ref 1.7–2.4)

## 2019-07-01 LAB — D-DIMER, QUANTITATIVE: D-Dimer, Quant: 5.11 ug/mL-FEU — ABNORMAL HIGH (ref 0.00–0.50)

## 2019-07-01 MED ORDER — ACETAMINOPHEN 325 MG PO TABS
650.0000 mg | ORAL_TABLET | Freq: Four times a day (QID) | ORAL | Status: DC | PRN
  Administered 2019-07-01 – 2019-07-02 (×4): 650 mg via ORAL
  Filled 2019-07-01 (×4): qty 2

## 2019-07-01 MED ORDER — DEXAMETHASONE 4 MG PO TABS
4.0000 mg | ORAL_TABLET | Freq: Every day | ORAL | Status: DC
  Administered 2019-07-01: 4 mg via ORAL
  Filled 2019-07-01 (×2): qty 1

## 2019-07-01 MED ORDER — DEXAMETHASONE 2 MG PO TABS
2.0000 mg | ORAL_TABLET | Freq: Every day | ORAL | Status: DC
  Administered 2019-07-02: 2 mg via ORAL
  Filled 2019-07-01 (×2): qty 1

## 2019-07-01 MED ORDER — CARVEDILOL 6.25 MG PO TABS
6.2500 mg | ORAL_TABLET | Freq: Two times a day (BID) | ORAL | Status: DC
  Administered 2019-07-01 – 2019-07-03 (×5): 6.25 mg via ORAL
  Filled 2019-07-01 (×5): qty 1

## 2019-07-01 MED ORDER — AMLODIPINE BESYLATE 10 MG PO TABS
10.0000 mg | ORAL_TABLET | Freq: Every day | ORAL | Status: DC
  Administered 2019-07-01 – 2019-07-03 (×3): 10 mg via ORAL
  Filled 2019-07-01 (×3): qty 1

## 2019-07-01 MED ORDER — POTASSIUM CHLORIDE CRYS ER 20 MEQ PO TBCR
40.0000 meq | EXTENDED_RELEASE_TABLET | Freq: Once | ORAL | Status: AC
  Administered 2019-07-01: 40 meq via ORAL
  Filled 2019-07-01: qty 2

## 2019-07-01 MED ORDER — APIXABAN 5 MG PO TABS: 5.0000 mg | ORAL_TABLET | Freq: Two times a day (BID) | ORAL | Status: DC

## 2019-07-01 MED ORDER — APIXABAN 5 MG PO TABS
10.0000 mg | ORAL_TABLET | Freq: Two times a day (BID) | ORAL | Status: DC
  Administered 2019-07-01 – 2019-07-03 (×5): 10 mg via ORAL
  Filled 2019-07-01 (×5): qty 2

## 2019-07-01 NOTE — Progress Notes (Addendum)
ANTICOAGULATION CONSULT NOTE  Pharmacy Consult for Heparin  Indication: pulmonary embolus  Assessment: 39 yr old female with new onset PE in the setting of COVID-19 infection.   Heparin level this am 0.17 units/ml  No issues noted with infusion  Goal of Therapy:  Heparin level 0.3-0.7 units/ml Monitor platelets by anticoagulation protocol: Yes   Plan:  Increase heparin infusion to 1700 units/hr Check heparin level in 6 hours Monitor daily heparin level, CBC Monitor for signs/symptoms of bleeding  Thanks for allowing pharmacy to be a part of this patient's care.  Talbert Cage, PharmD Clinical Pharmacist 07/01/2019 5:50 AM  Addum:  Change to eliquis 10 mg po bid for 7 days then 5mg  po bid

## 2019-07-01 NOTE — Progress Notes (Signed)
Transitions of Care Pharmacist Note  Shelly Shields is a 39 y.o. female that has been diagnosed with PE and will be prescribed Eliquis (apixaban) at discharge.   Patient Education: I provided the following education on apixaban to the patient: How to take the medication Described what the medication is Signs of bleeding Answered their questions  Discharge Medications Plan: The patient is not interested in filling their discharge medications with the Transitions of Care pharmacy at this time.   If the patient later decides they would like to have the Transitions of Care pharmacy fill their discharge medications, please call us at [phone]. Thank you.    Thank you,   Gerrit Halls, PharmD PGY1 Pharmacy Resident  July 01, 2019

## 2019-07-01 NOTE — Progress Notes (Signed)
Patient up to bedside commode. Experienced desaturation to 82% and extremely sob with increased wob following. Patient placed on right side and educated about deep breathing.  Lyndal Pulley, RN 07/01/2019 10:08 AM

## 2019-07-01 NOTE — Progress Notes (Signed)
Responded to spiritual care consult to provide support to patient and assist with AD.  AD was given to director for patient and will notify chaplain if needed.  Chaplain available as needed.  Venida Jarvis, [place], [facility], Pager [phone]

## 2019-07-01 NOTE — Progress Notes (Signed)
PROGRESS NOTE                                                                                                                                                                                                             Patient Demographics:    Shelly Shields, is a 39 y.o. female, DOB - [birth year], HQI:[id number]  Outpatient Primary MD for the patient is [name], MD    LOS - 1  Admit date - 06/29/2019    Chief Complaint  Patient presents with  . COVID  . Shortness of Breath  . Cough       Brief Narrative  39 year old female, obese, with past medical history significant for OSA, diabetes mellitus, hypertension and migraine.  Apparently, patient was diagnosed with Covid on June 11, 2019.  Patient has been managed symptomatically at home, with reports of fever, viral syndrome shortness of breath, her work-up in the ER was suggestive of COVID-19 pneumonia along with bilateral PE.  She also had been undergoing treatment for right gluteal infection for the last several days and was on doxycycline.  She was admitted to the hospital for further care.   Subjective:   Patient in bed, appears comfortable, denies any headache, no fever, no chest pain or pressure, no shortness of breath , no abdominal pain. No focal weakness.    Assessment  & Plan :     1. Acute Hypoxic Resp. Failure due to Acute Covid 19 Viral Pneumonitis during the ongoing 2020 Covid 19 Pandemic - she seems to have mild Covid disease and pneumonia, currently stable on room air, continue low-dose steroids and IV remdesivir for now.    Encouraged the patient to sit up in chair in the daytime use I-S and flutter valve for pulmonary toiletry and then prone in bed when at night.    SpO2: 95 %  Recent Labs  Lab 06/30/19 0200 07/01/19 0420  CRP 17.5* 12.0*  DDIMER 5.54* 5.11*  FERRITIN 413*  --   BNP  --  66.4  PROCALCITON 0.15  --     Hepatic  Function Latest Ref Rng & Units 07/01/2019 06/30/2019 06/22/2019  Total Protein 6.5 - 8.1 g/dL 6.4(L) 7.6 7.5  Albumin 3.5 - 5.0 g/dL 2.1(L) 2.4(L) 2.6(L)  AST 15 - 41 U/L '19 17 23  '$ ALT 0 - 44 U/L 18  17 32  Alk Phosphatase 38 - 126 U/L 80 99 97  Total Bilirubin 0.3 - 1.2 mg/dL 0.9 1.3(H) 0.8    2.  Bilateral PE.  Likely due to inflammation caused by COVID-19 infection, lack of activity and patient being on Depo shots.  Currently on IV heparin drip, will discontinue Depo shots at the time of discharge.  Lower extremity venous duplex and echocardiogram are unremarkable.  3.  Obesity OSA.  Follow with PCP, nighttime oxygen if needed.  4.  Right gluteal skin infection.  No abscess on CT scan.  Continue doxycycline and monitor closely.    5. DM type II.  Placed on Lantus twice daily at home dose along with premeal NovoLog and high-dose sliding scale.  Will monitor and adjust.  A1c suggests extremely poor outpatient glycemic control due to hyperglycemia.  Provided with diabetic and insulin education.  Lab Results  Component Value Date   HGBA1C 11.7 (H) 06/30/2019    CBG (last 3)  Recent Labs    06/30/19 1734 06/30/19 2110 07/01/19 0734  GLUCAP 320* 269* 105*       Condition - Fair  Family Communication  :  None  Code Status :  Full  Diet :   Diet Order            Diet heart healthy/carb modified Room service appropriate? Yes; Fluid consistency: Thin  Diet effective now               Disposition Plan  : Home once she is stable from acute Covid and acute PE standpoint.  Also at risk for gluteal abscess, gluteal infection being monitored.  Still on IV remdesivir, on full dose anticoagulation and antibiotics.  Consults  : None  Procedures  :    CT angiogram chest.  Bilateral PE  Lower extremity venous ultrasound.  No DVT  CT of pelvis.  Right gluteal induration without any fluid collection or abscess.  TTE -  1. Left ventricular ejection fraction, by visual  estimation, is 50 to  55%. The left ventricle has normal function. There is no increased left  ventricular wall thickness.  2. Global right ventricle has mildly reduced systolic function.The right  ventricular size is mildly enlarged.  3. Left ventricular diastolic parameters are indeterminate.  4. The mitral valve is normal in structure. Trivial mitral valve  regurgitation. No evidence of mitral stenosis.  5. The tricuspid valve was normal in structure. Tricuspid valve  regurgitation is trivial.   PUD Prophylaxis :   DVT Prophylaxis  :    Heparin gtt  Lab Results  Component Value Date   PLT 390 07/01/2019    Inpatient Medications  Scheduled Meds: . amLODipine  10 mg Oral Daily  . apixaban  10 mg Oral BID  . [START ON 07/08/2019] apixaban  5 mg Oral BID  . vitamin C  500 mg Oral Daily  . carvedilol  6.25 mg Oral BID WC  . [START ON 07/02/2019] dexamethasone  2 mg Oral Daily  . doxycycline  100 mg Oral BID  . folic acid  1 mg Oral Daily  . insulin aspart  0-20 Units Subcutaneous TID WC  . insulin aspart  0-5 Units Subcutaneous QHS  . insulin aspart  8 Units Subcutaneous TID WC  . insulin glargine  80 Units Subcutaneous BID  . Ipratropium-Albuterol  1 puff Inhalation Q6H  . multivitamin with minerals  1 tablet Oral Daily  . rosuvastatin  10 mg Oral Once per day on  Mon Thu  . topiramate  200 mg Oral QHS  . zinc sulfate  220 mg Oral Daily   Continuous Infusions: . remdesivir 100 mg in NS 100 mL 100 mg (07/01/19 0805)   PRN Meds:.iohexol, SUMAtriptan  Antibiotics  :    Anti-infectives (From admission, onward)   Start     Dose/Rate Route Frequency Ordered Stop   07/01/19 1000  remdesivir 100 mg in sodium chloride 0.9 % 100 mL IVPB  Status:  Discontinued     100 mg 200 mL/hr over 30 Minutes Intravenous Daily 06/30/19 0318 06/30/19 0402   07/01/19 1000  remdesivir 100 mg in sodium chloride 0.9 % 100 mL IVPB     100 mg 200 mL/hr over 30 Minutes Intravenous Daily  06/30/19 0128 07/05/19 0959   06/30/19 0400  remdesivir 200 mg in sodium chloride 0.9% 250 mL IVPB  Status:  Discontinued     200 mg 580 mL/hr over 30 Minutes Intravenous Once 06/30/19 0318 06/30/19 0402   06/30/19 0330  doxycycline (VIBRA-TABS) tablet 100 mg     100 mg Oral 2 times daily 06/30/19 0318     06/30/19 0200  remdesivir 200 mg in sodium chloride 0.9% 250 mL IVPB     200 mg 580 mL/hr over 30 Minutes Intravenous Once 06/30/19 0128 06/30/19 0317       Time Spent in minutes  Kramer M.D on 07/01/2019 at 10:49 AM  To page go to [email or web address] - password [place]  [facility] -  Office  [phone]    See all Orders from today for further details    Objective:   Vitals:   06/30/19 2016 07/01/19 0016 07/01/19 0537 07/01/19 0800  BP: (!) 129/98 135/82 (!) 134/101 (!) 152/104  Pulse: (!) 113 100 87   Resp: (!) 24 (!) 21 (!) 21 (!) 23  Temp: 99.1 F (37.3 C) 99 F (37.2 C) 98.3 F (36.8 C) 97.9 F (36.6 C)  TempSrc: Oral Oral Oral Oral  SpO2: 94% 94% 95%   Height:        Wt Readings from Last 3 Encounters:  06/22/19 92.5 kg  03/27/18 102.1 kg  09/21/17 101.2 kg     Intake/Output Summary (Last 24 hours) at 07/01/2019 1049 Last data filed at 07/01/2019 4680 Gross per 24 hour  Intake 2213.41 ml  Output --  Net 2213.41 ml     Physical Exam  Awake Alert,   No new F.N deficits, Normal affect [place].AT,PERRAL Supple Neck,No JVD, No cervical lymphadenopathy appriciated.  Symmetrical Chest wall movement, Good air movement bilaterally, CTAB RRR,No Gallops, Rubs or new Murmurs, No Parasternal Heave +ve B.Sounds, Abd Soft, No tenderness, No organomegaly appriciated, No rebound - guarding or rigidity. No Cyanosis, Clubbing or edema,   R Gluteal inner aspect has small induration and an open sore, no pus was present to be extracted.       Data Review:    CBC Recent Labs  Lab 06/29/19 1746 07/01/19 0420  WBC 13.2* 16.3*  HGB 12.5 11.3*  HCT 38.6  34.9*  PLT 367 390  MCV 89.8 88.8  MCH 29.1 28.8  MCHC 32.4 32.4  RDW 13.1 13.2  LYMPHSABS  --  3.6  MONOABS  --  1.1*  EOSABS  --  0.0  BASOSABS  --  0.0    Chemistries  Recent Labs  Lab 06/29/19 1746 06/30/19 0200 07/01/19 0420  NA 133* 138 137  K 3.6 3.8 3.3*  CL 93* 97* 101  CO2 '25 24 23  '$ GLUCOSE 463* 324* 195*  BUN '6 6 12  '$ CREATININE 0.81 0.77 0.77  CALCIUM 8.7* 8.9 9.0  MG  --   --  1.7  AST  --  17 19  ALT  --  17 18  ALKPHOS  --  99 80  BILITOT  --  1.3* 0.9   ------------------------------------------------------------------------------------------------------------------ No results for input(s): CHOL, HDL, LDLCALC, TRIG, CHOLHDL, LDLDIRECT in the last 72 hours.  Lab Results  Component Value Date   HGBA1C 11.7 (H) 06/30/2019   ------------------------------------------------------------------------------------------------------------------ No results for input(s): TSH, T4TOTAL, T3FREE, THYROIDAB in the last 72 hours.  Invalid input(s): FREET3  Cardiac Enzymes No results for input(s): CKMB, TROPONINI, MYOGLOBIN in the last 168 hours.  Invalid input(s): CK ------------------------------------------------------------------------------------------------------------------    Component Value Date/Time   BNP 66.4 07/01/2019 0420    Micro Results Recent Results (from the past 240 hour(s))  MRSA PCR Screening     Status: None   Collection Time: 06/30/19  1:54 PM   Specimen: Nasal Mucosa; Nasopharyngeal  Result Value Ref Range Status   MRSA by PCR NEGATIVE NEGATIVE Final    Comment: Performed at [facility], [place] [address], [place], [place] [address]    Radiology Reports CT Angio Chest PE W and/or Wo Contrast  Result Date: 06/29/2019 CLINICAL DATA:  Shortness of breath EXAM: CT ANGIOGRAPHY CHEST WITH CONTRAST TECHNIQUE: Multidetector CT imaging of the chest was performed using the standard protocol during bolus administration of intravenous  contrast. Multiplanar CT image reconstructions and MIPs were obtained to evaluate the vascular anatomy. CONTRAST:  79m OMNIPAQUE IOHEXOL 350 MG/ML SOLN COMPARISON:  None. FINDINGS: Cardiovascular: Contrast injection is sufficient to demonstrate satisfactory opacification of the pulmonary arteries to the segmental level.There are extensive acute bilateral pulmonary emboli involving the main left pulmonary artery extending into the lobar, segmental, and subsegmental branches bilaterally. There are lobar, segmental, and subsegmental pulmonary emboli involving the right lower lobe. Segmental and subsegmental pulmonary emboli are noted involving the right upper lobe. There is borderline right-sided heart strain with an RV LV ratio measuring approximately 0.9. There is however mild reflux of contrast in the IVC consistent with underlying cardiac dysfunction. Heart size is enlarged. Mediastinum/Nodes: --No mediastinal or hilar lymphadenopathy. --No axillary lymphadenopathy. --No supraclavicular lymphadenopathy. --Normal thyroid gland. --The esophagus is unremarkable Lungs/Pleura: Patchy bilateral ground-glass airspace opacities are noted bilaterally. There is no pneumothorax. No large pleural effusion. The trachea is unremarkable. Upper Abdomen: No acute abnormality. Musculoskeletal: No chest wall abnormality. No acute or significant osseous findings. Review of the MIP images confirms the above findings. IMPRESSION: 1. Extensive bilateral acute pulmonary emboli as detailed above. The RV/LV ratio is borderline measuring 0.9, however there is reflux of contrast in the IVC consistent with right-sided heart strain. 2. Diffuse bilateral ground-glass airspace opacities concerning for an atypical infectious process such as viral pneumonia. These results were called by telephone at the time of interpretation on 06/29/2019 at 11:22 pm to provider [facility], who verbally acknowledged these results. Electronically Signed   By:  CConstance HolsterM.D.   On: 06/29/2019 23:26   CT PELVIS W CONTRAST  Result Date: 06/30/2019 CLINICAL DATA:  Right gluteal inflammation on prior CT, abscess EXAM: CT PELVIS WITH CONTRAST TECHNIQUE: Multidetector CT imaging of the pelvis was performed using the standard protocol following the bolus administration of intravenous contrast. CONTRAST:  103mOMNIPAQUE IOHEXOL 300 MG/ML  SOLN COMPARISON:  06/22/2019 FINDINGS: Urinary Tract: There is excreted contrast filling the  urinary bladder without filling defect. Distal ureters are unremarkable. Bowel:  No bowel obstruction or ileus. Vascular/Lymphatic: No pathologically enlarged lymph nodes. No significant vascular abnormality seen. Reproductive:  No mass or other significant abnormality Other: Inflammatory changes seen within the medial inferior aspect of the right gluteal fold are again identified. There is no fluid collection or formed abscess. Musculoskeletal: No acute or destructive bony lesions. Reconstructed images demonstrate no additional findings. IMPRESSION: 1. Inflammatory changes within the medial inferior aspect of the right gluteal fold consistent with cellulitis. No fluid collection or formed abscess. Electronically Signed   By: Randa Ngo M.D.   On: 06/30/2019 11:13   CT ABDOMEN PELVIS W CONTRAST  Result Date: 06/22/2019 CLINICAL DATA:  Abscess to right buttock with drainage. EXAM: CT ABDOMEN AND PELVIS WITH CONTRAST TECHNIQUE: Multidetector CT imaging of the abdomen and pelvis was performed using the standard protocol following bolus administration of intravenous contrast. CONTRAST:  120m OMNIPAQUE IOHEXOL 300 MG/ML  SOLN COMPARISON:  January 12, 2016 FINDINGS: Lower chest: Mild to moderate severity patchy infiltrates are seen throughout both lung bases. Hepatobiliary: No focal liver abnormality is seen. No gallstones, gallbladder wall thickening, or biliary dilatation. Pancreas: Unremarkable. No pancreatic ductal dilatation or  surrounding inflammatory changes. Spleen: Normal in size without focal abnormality. Adrenals/Urinary Tract: Adrenal glands are unremarkable. Kidneys are normal, without renal calculi, focal lesion, or hydronephrosis. Bladder is unremarkable. Stomach/Bowel: Stomach is within normal limits. Appendix appears normal. No evidence of bowel wall thickening, distention, or inflammatory changes. Vascular/Lymphatic: Reproductive: Uterus and bilateral adnexa are unremarkable. Other: No abdominal wall hernia or abnormality. No abdominopelvic ascites. Musculoskeletal: Mild diffuse inflammatory fat stranding is seen along the gluteal region on the right. There is no evidence of associated fluid collection or abscess. No acute or significant osseous findings. IMPRESSION: 1. Mild inflammation involving the gluteal region on the right, without visualization of an associated fluid collection or abscess. 2. Mild to moderate severity patchy bibasilar infiltrates. Electronically Signed   By: TVirgina NorfolkM.D.   On: 06/22/2019 22:40   VAS [place]OWER EXTREMITY VENOUS (DVT)  Result Date: 06/30/2019  Lower Venous DVTStudy Indications: Pulmonary embolism.  Comparison Study: no prior Performing Technologist: MAbram SanderRVS  Examination Guidelines: A complete evaluation includes B-mode imaging, spectral Doppler, color Doppler, and power Doppler as needed of all accessible portions of each vessel. Bilateral testing is considered an integral part of a complete examination. Limited examinations for reoccurring indications may be performed as noted. The reflux portion of the exam is performed with the patient in reverse Trendelenburg.  +---------+---------------+---------+-----------+----------+--------------+ RIGHT    CompressibilityPhasicitySpontaneityPropertiesThrombus Aging +---------+---------------+---------+-----------+----------+--------------+ CFV      Full           Yes      Yes                                  +---------+---------------+---------+-----------+----------+--------------+ SFJ      Full                                                        +---------+---------------+---------+-----------+----------+--------------+ FV Prox  Full                                                        +---------+---------------+---------+-----------+----------+--------------+  FV Mid   Full                                                        +---------+---------------+---------+-----------+----------+--------------+ FV DistalFull                                                        +---------+---------------+---------+-----------+----------+--------------+ PFV      Full                                                        +---------+---------------+---------+-----------+----------+--------------+ POP      Full           Yes      Yes                                 +---------+---------------+---------+-----------+----------+--------------+ PTV      Full                                                        +---------+---------------+---------+-----------+----------+--------------+ PERO     Full                                                        +---------+---------------+---------+-----------+----------+--------------+   +---------+---------------+---------+-----------+----------+--------------+ LEFT     CompressibilityPhasicitySpontaneityPropertiesThrombus Aging +---------+---------------+---------+-----------+----------+--------------+ CFV      Full           Yes      Yes                                 +---------+---------------+---------+-----------+----------+--------------+ SFJ      Full                                                        +---------+---------------+---------+-----------+----------+--------------+ FV Prox  Full                                                         +---------+---------------+---------+-----------+----------+--------------+ FV Mid   Full                                                        +---------+---------------+---------+-----------+----------+--------------+  FV DistalFull                                                        +---------+---------------+---------+-----------+----------+--------------+ PFV      Full                                                        +---------+---------------+---------+-----------+----------+--------------+ POP      Full           Yes      Yes                                 +---------+---------------+---------+-----------+----------+--------------+ PTV      Full                                                        +---------+---------------+---------+-----------+----------+--------------+ PERO     Full                                                        +---------+---------------+---------+-----------+----------+--------------+     Summary: BILATERAL: - No evidence of deep vein thrombosis seen in the lower extremities, bilaterally.   *See table(s) above for measurements and observations. Electronically signed by Curt Jews MD on 06/30/2019 at 5:10:28 PM.    Final    ECHOCARDIOGRAM LIMITED  Result Date: 06/30/2019   ECHOCARDIOGRAM LIMITED REPORT   Patient Name:   Shelly Shields Date of Exam: 06/30/2019 Medical Rec #:  [id number]        Height:       61.0 in Accession #:    [id number]       Weight:       204.0 lb Date of Birth:  May 02, 1981        BSA:          1.90 m Patient Age:    72 years         BP:           134/97 mmHg Patient Gender: F                HR:           105 bpm. Exam Location:  Inpatient  Procedure: Limited Echo, Limited Color Doppler and Cardiac Doppler Indications:    pulmonary emboli  History:        Patient has no prior history of Echocardiogram examinations.  Sonographer:    Johny Chess Referring Phys: [id number] RAHUL P DESAI IMPRESSIONS  1. Left  ventricular ejection fraction, by visual estimation, is 50 to 55%. The left ventricle has normal function. There is no increased left ventricular wall thickness.  2. Global right ventricle has mildly reduced systolic function.The right ventricular size is mildly enlarged.  3. Left ventricular diastolic parameters are  indeterminate.  4. The mitral valve is normal in structure. Trivial mitral valve regurgitation. No evidence of mitral stenosis.  5. The tricuspid valve was normal in structure. Tricuspid valve regurgitation is trivial. FINDINGS  Left Ventricle: Left ventricular ejection fraction, by visual estimation, is 50 to 55%. The left ventricle has normal function. There is no increased left ventricular wall thickness. Left ventricular diastolic parameters are indeterminate. Right Ventricle: The right ventricular size is mildly enlarged. Right vetricular wall thickness was not assessed. Global RV systolic function is has mildly reduced systolic function. Left Atrium: Left atrial size was normal in size. Right Atrium: Right atrial size was normal in size. Right atrial pressure is estimated at 3 mmHg. Pericardium: There is no evidence of pericardial effusion is seen. There is no evidence of pericardial effusion. Mitral Valve: The mitral valve is normal in structure. No evidence of mitral valve stenosis by observation. Trivial mitral valve regurgitation. Tricuspid Valve: The tricuspid valve is normal in structure. Tricuspid valve regurgitation is trivial. Aortic Valve: The aortic valve is normal in structure. The aortic valve is structurally normal, with no evidence of sclerosis or stenosis. Pulmonic Valve: The pulmonic valve was normal in structure. Pulmonic valve regurgitation is not visualized by color flow Doppler. Pulmonic regurgitation is not visualized by color flow Doppler. Aorta: The aortic root, ascending aorta and aortic arch are all structurally normal, with no evidence of dilitation or obstruction.  Venous: The inferior vena cava was not well visualized. Shunts: The interatrial septum was not well visualized.  LEFT VENTRICLE          Normals PLAX 2D LVIDd:         4.60 cm  3.6 cm   Diastology                Normals LVIDs:         3.20 cm  1.7 cm   LV e' lateral: 7.72 cm/s  6.42 cm/s LV PW:         1.00 cm  1.4 cm   LV e' medial:  11.60 cm/s 6.96 cm/s LV IVS:        0.80 cm  1.3 cm LVOT diam:     1.80 cm  2.0 cm LV SV:         56 ml    79 ml LV SV Index:   27.57    45 ml/m2 LVOT Area:     2.54 cm 3.14 cm2  LEFT ATRIUM         Index LA diam:    3.10 cm 1.63 cm/m  AORTIC VALVE             Normals LVOT Vmax:   93.80 cm/s LVOT Vmean:  56.200 cm/s 75 cm/s LVOT VTI:    0.148 m     25.3 cm  AORTA                 Normals Ao Root diam: 2.90 cm 31 mm  SHUNTS Systemic VTI:  0.15 m Systemic Diam: 1.80 cm  Cherlynn Kaiser MD Electronically signed by Cherlynn Kaiser MD Signature Date/Time: 06/30/2019/5:31:43 PMThe mitral valve is normal in structure.    Final

## 2019-07-01 NOTE — Progress Notes (Addendum)
Inpatient Diabetes Program Recommendations  AACE/ADA: New Consensus Statement on Inpatient Glycemic Control (2015)  Target Ranges:  Prepandial:   less than 140 mg/dL      Peak postprandial:   less than 180 mg/dL (1-2 hours)      Critically ill patients:  140 - 180 mg/dL   Lab Results  Component Value Date   GLUCAP 108 (H) 07/01/2019   HGBA1C 11.7 (H) 06/30/2019    Review of Glycemic Control Results for Shelly Shields, Shelly Shields (MRN [id number]) as of 07/01/2019 13:44  Ref. Range 06/30/2019 17:34 06/30/2019 21:10 07/01/2019 07:34 07/01/2019 12:11  Glucose-Capillary Latest Ref Range: 70 - 99 mg/dL 710 (H) 626 (H) 948 (H) 108 (H)   Diabetes history: Type 2 DM Outpatient Diabetes medications: Lantus 80 units BID, Novolog TID (based on what she eats) Current orders for Inpatient glycemic control: Novolog 0-20 units TID, Novolog 0-5 units QHS, Novolog 8 units TID, Lantus 80 units BID  Inpatient Diabetes Program Recommendations:    Spoke with patient regarding outpatient diabetes management. Patient verifies Lantus and reports, "I take my medications mostly, but never check my blood sugar and just guess as to what I take for my meals". Reviewed patient's current A1c of 11.7%. Explained what A1c is and what it measures. Also reviewed goal A1c with patient, importance of good glucose control @ home, and blood sugar goals. Reviewed patho of DM, need for insulin, role of pancreas, impact of infection and steroids on glucose trends, vascular changes and commorbidities.  Patient has a meter and supplies, but does not check on a regular basis. She states, "I just am bad about it." Encouraged to check 3 times per day, at least to determine glucose ranges to allow for insulin adjustments. Additionally, discussed [employer], use, benefits, cost and how to obtain. Will attach endocrinology list to discharge summary. Encouraged to follow up with PCP in the next few weeks and to ask for a referral.  Admits to drinking 3  sodas a day with meals. Reviewed alternatives to sugary beverages and reviewed impact and lack of nutritional value these drinks provide. Also, reviewed goal setting, plate method, and working to develop goals towards improvement. Patient declines additional videos or consult with dietitian or outpatient education. Patient has no additional questions at this time.   Thanks, Lujean Rave, MSN, RNC-OB Diabetes Coordinator [phone] (8a-5p)

## 2019-07-01 NOTE — Evaluation (Signed)
Physical Therapy Evaluation Patient Details Name: Shelly Shields MRN: [id number] DOB: 1981-05-02 Today's Date: 07/01/2019   History of Present Illness  39 year old female, obese, with past medical history significant for OSA, diabetes mellitus, hypertension and migraine. Diagnosed with COVID 06/11/19 which she has been managing at home. Also managing R gluteal abscess with doxycycline. Presented to ED 2/2 with fever, and SoB. Admitted with Acute Hypoxic Resp. Failure due to Acute Covid 19 Viral Pneumonitis, and bilateral PE.    Clinical Impression  PTA pt living with her cousin in a multistory home with ramped entrance and bed/bath on main floor. 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 Pt is currently limited in safe mobility by 3/4 DoE with short distance ambulation, and generalized weakness. Pt is supervision for bed mobility, and transfers and min guard for ambulation of 30 feet without AD. PT does not anticipate any further PT services or equipment at discharge, however will continue to follow acutely to progress ambulation.     Follow Up Recommendations No PT follow up;Supervision - Intermittent    Equipment Recommendations  None recommended by PT    Recommendations for Other Services OT consult     Precautions / Restrictions Precautions Precautions: None Restrictions Weight Bearing Restrictions: No      Mobility  Bed Mobility Overal bed mobility: Needs Assistance Bed Mobility: Supine to Sit     Supine to sit: HOB elevated;Supervision     General bed mobility comments: supervision for safety, increased effort to pull to EoB with use of the bedrail  Transfers Overall transfer level: Needs assistance   Transfers: Sit to/from Stand Sit to Stand: Supervision         General transfer comment: supervision for safety, good power up and steadying, slight dizziness which dissipated quickly  Ambulation/Gait Ambulation/Gait assistance: Min  guard Gait Distance (Feet): 30 Feet Assistive device: None Gait Pattern/deviations: Step-through pattern;Decreased step length - right;Decreased step length - left;Shuffle Gait velocity: slowed Gait velocity interpretation: <1.31 ft/sec, indicative of household ambulator General Gait Details: min guard for slow mildly unsteady shuffling gait, no overt LoB, 3/4 DoE by time she reached the door, returned to recliner      Balance Overall balance assessment: Mild deficits observed, not formally tested                                           Pertinent Vitals/Pain Pain Assessment: Faces Faces Pain Scale: Hurts a little bit Pain Location: chest with coughing Pain Descriptors / Indicators: Grimacing    Home Living Family/patient expects to be discharged to:: Private residence Living Arrangements: Other relatives Available Help at Discharge: Family;Available PRN/intermittently Type of Home: House Home Access: Ramped entrance     Home Layout: Two level;Able to live on main level with bedroom/bathroom Home Equipment: None      Prior Function Level of Independence: Independent         Comments: work as Arts administrator        Extremity/Trunk Assessment   Upper Extremity Assessment Upper Extremity Assessment: Generalized weakness    Lower Extremity Assessment Lower Extremity Assessment: Generalized weakness       Communication   Communication: No difficulties  Cognition Arousal/Alertness: Awake/alert Behavior During Therapy: WFL for tasks assessed/performed Overall Cognitive Status: Within Functional Limits for tasks assessed  General Comments General comments (skin integrity, edema, etc.): Pt on RA on entry with SaO2 96%O2, with ambulation to/from door SaO2 dropped to 92%O2 with 3/4 DoE, with sitting SaO2 rebounded to 98%O2        Assessment/Plan    PT Assessment  Patient needs continued PT services  PT Problem List Decreased strength;Decreased activity tolerance;Decreased balance;Decreased mobility;Cardiopulmonary status limiting activity       PT Treatment Interventions DME instruction;Gait training;Functional mobility training;Therapeutic activities;Therapeutic exercise;Balance training;Cognitive remediation;Patient/family education    PT Goals (Current goals can be found in the Care Plan section)  Acute Rehab PT Goals Patient Stated Goal: get back to work PT Goal Formulation: With patient Time For Goal Achievement: 07/15/19 Potential to Achieve Goals: Good    Frequency Min 3X/week   Barriers to discharge Decreased caregiver support         AM-PAC PT "6 Clicks" Mobility  Outcome Measure Help needed turning from your back to your side while in a flat bed without using bedrails?: None Help needed moving from lying on your back to sitting on the side of a flat bed without using bedrails?: A Little Help needed moving to and from a bed to a chair (including a wheelchair)?: None Help needed standing up from a chair using your arms (e.g., wheelchair or bedside chair)?: None Help needed to walk in hospital room?: None Help needed climbing 3-5 steps with a railing? : A Little 6 Click Score: 22    End of Session   Activity Tolerance: Patient limited by fatigue Patient left: in chair;with call bell/phone within reach Nurse Communication: Mobility status PT Visit Diagnosis: Unsteadiness on feet (R26.81);Other abnormalities of gait and mobility (R26.89);Muscle weakness (generalized) (M62.81);Difficulty in walking, not elsewhere classified (R26.2)    Time: 0981-1914 PT Time Calculation (min) (ACUTE ONLY): 18 min   Charges:   PT Evaluation $PT Eval Moderate Complexity: 1 Mod           B. Migdalia Dk PT, DPT Acute Rehabilitation Services Pager [phone] Office [phone]   [place] 07/01/2019, 11:58  AM

## 2019-07-02 LAB — COMPREHENSIVE METABOLIC PANEL
ALT: 21 U/L (ref 0–44)
AST: 16 U/L (ref 15–41)
Albumin: 2.1 g/dL — ABNORMAL LOW (ref 3.5–5.0)
Alkaline Phosphatase: 77 U/L (ref 38–126)
Anion gap: 10 (ref 5–15)
BUN: 12 mg/dL (ref 6–20)
CO2: 20 mmol/L — ABNORMAL LOW (ref 22–32)
Calcium: 9.1 mg/dL (ref 8.9–10.3)
Chloride: 108 mmol/L (ref 98–111)
Creatinine, Ser: 0.75 mg/dL (ref 0.44–1.00)
GFR calc Af Amer: >60 mL/min (ref >60)
GFR calc non Af Amer: >60 mL/min (ref >60)
Glucose, Bld: 153 mg/dL — ABNORMAL HIGH (ref 70–99)
Potassium: 3.5 mmol/L (ref 3.5–5.1)
Sodium: 138 mmol/L (ref 135–145)
Total Bilirubin: 0.9 mg/dL (ref 0.3–1.2)
Total Protein: 6.2 g/dL — ABNORMAL LOW (ref 6.5–8.1)

## 2019-07-02 LAB — D-DIMER, QUANTITATIVE: D-Dimer, Quant: 6.09 ug/mL-FEU — ABNORMAL HIGH (ref 0.00–0.50)

## 2019-07-02 LAB — BRAIN NATRIURETIC PEPTIDE: B Natriuretic Peptide: 51.1 pg/mL (ref 0.0–100.0)

## 2019-07-02 LAB — C-REACTIVE PROTEIN: CRP: 5.5 mg/dL — ABNORMAL HIGH (ref <1.0)

## 2019-07-02 LAB — MAGNESIUM: Magnesium: 1.9 mg/dL (ref 1.7–2.4)

## 2019-07-02 LAB — GLUCOSE, CAPILLARY
Glucose-Capillary: 133 mg/dL — ABNORMAL HIGH (ref 70–99)
Glucose-Capillary: 185 mg/dL — ABNORMAL HIGH (ref 70–99)
Glucose-Capillary: 287 mg/dL — ABNORMAL HIGH (ref 70–99)
Glucose-Capillary: 249 mg/dL — ABNORMAL HIGH (ref 70–99)

## 2019-07-02 MED ORDER — SENNOSIDES-DOCUSATE SODIUM 8.6-50 MG PO TABS
1.0000 | ORAL_TABLET | Freq: Every evening | ORAL | Status: DC | PRN
  Administered 2019-07-02: 2 via ORAL
  Filled 2019-07-02: qty 2

## 2019-07-02 MED ORDER — ONDANSETRON HCL 4 MG/2ML IJ SOLN
4.0000 mg | Freq: Four times a day (QID) | INTRAMUSCULAR | Status: DC | PRN
  Administered 2019-07-02: 4 mg via INTRAVENOUS
  Filled 2019-07-02 (×2): qty 2

## 2019-07-02 NOTE — Progress Notes (Signed)
Physical Therapy Treatment Patient Details Name: Shelly Shields MRN: [id number] DOB: [birth year] Today's Date: 07/02/2019    History of Present Illness 38 year old female, obese, with past medical history significant for OSA, diabetes mellitus, hypertension and migraine. Diagnosed with COVID 06/11/19 which she has been managing at home. Also managing R gluteal abscess with doxycycline. Presented to ED 2/2 with fever, and SoB. Admitted with Acute Hypoxic Resp. Failure due to Acute Covid 19 Viral Pneumonitis, and bilateral PE.      PT Comments    Pt sitting up in recliner agreeable to progressing ambulation today. Pt is limited in safe mobility by increased work of breathing and coughing with mobility. Pt is supervision for transfers and min guard for ambulation. Pt reports she has w/c and RW left from her grandmother. Encouraged pt to use RW with ambulation for energy conservation. D/c plans remain appropriate at this time. PT will continue to follow acutely.   Follow Up Recommendations  No PT follow up;Supervision - Intermittent     Equipment Recommendations  None recommended by PT    Recommendations for Other Services OT consult     Precautions / Restrictions Precautions Precautions: None Restrictions Weight Bearing Restrictions: No    Mobility  Bed Mobility               General bed mobility comments: OOB in recliner on entry   Transfers Overall transfer level: Needs assistance   Transfers: Sit to/from Stand Sit to Stand: Supervision         General transfer comment: supervision for safety, good power up and steadying, slight dizziness which dissipated quickly  Ambulation/Gait Ambulation/Gait assistance: Min guard Gait Distance (Feet): 60 Feet Assistive device: None Gait Pattern/deviations: Step-through pattern;Decreased step length - right;Decreased step length - left;Shuffle Gait velocity: slowed Gait velocity interpretation: 1.31 - 2.62 ft/sec, indicative  of limited community ambulator General Gait Details: min guard for safety with slow steady gait, at about 30 feet ambulation pt experiences bout of coughing, pt able to return to room with decreased velocity and continued coughing, visibly fatigued when returned to sitting in recliner, coughing subsided          Balance Overall balance assessment: Mild deficits observed, not formally tested                                          Cognition Arousal/Alertness: Awake/alert Behavior During Therapy: WFL for tasks assessed/performed Overall Cognitive Status: Within Functional Limits for tasks assessed                                           General Comments General comments (skin integrity, edema, etc.): Pt able to ambulate on RA with SaO2 >93%O2 despite protracted coughing      Pertinent Vitals/Pain Pain Assessment: Faces Faces Pain Scale: Hurts little more Pain Location: chest with coughing Pain Descriptors / Indicators: Grimacing    Home Living                      Prior Function            PT Goals (current goals can now be found in the care plan section) Acute Rehab PT Goals Patient Stated Goal: get back to work PT Goal Formulation: With patient Time For  Goal Achievement: 07/15/19 Potential to Achieve Goals: Good Progress towards PT goals: Progressing toward goals    Frequency    Min 3X/week      PT Plan Current plan remains appropriate       AM-PAC PT "6 Clicks" Mobility   Outcome Measure  Help needed turning from your back to your side while in a flat bed without using bedrails?: None Help needed moving from lying on your back to sitting on the side of a flat bed without using bedrails?: A Little Help needed moving to and from a bed to a chair (including a wheelchair)?: None Help needed standing up from a chair using your arms (e.g., wheelchair or bedside chair)?: None Help needed to walk in hospital room?:  None Help needed climbing 3-5 steps with a railing? : A Little 6 Click Score: 22    End of Session   Activity Tolerance: Patient limited by fatigue Patient left: in chair;with call bell/phone within reach Nurse Communication: Mobility status PT Visit Diagnosis: Unsteadiness on feet (R26.81);Other abnormalities of gait and mobility (R26.89);Muscle weakness (generalized) (M62.81);Difficulty in walking, not elsewhere classified (R26.2)     Time: 6010-9323 PT Time Calculation (min) (ACUTE ONLY): 25 min  Charges:  $Gait Training: 23-37 mins                      B. Beverely Risen PT, DPT Acute Rehabilitation Services Pager [phone] Office [phone]    [facility] 07/02/2019, 3:49 PM

## 2019-07-02 NOTE — TOC Benefit Eligibility Note (Signed)
Transition of Care [facility]) Benefit Eligibility Note    Patient Details  Name: Shelly Shields MRN: [id number] Date of Birth: 28-Jan-1981   Medication/Dose: ELIQUIS  2.5 MG BID   and   ELIQUIS  5 MG BID  Covered?: Yes  Tier: (NO TIER)  Prescription Coverage Preferred Pharmacy: [facility]  and   [facility]  Spoke with Person/Company/Phone Number:: LOVFIE   @  [employer]  # [phone]  Co-Pay: $15.00  Prior Approval: No  Deductible: (NO DEDUCTIBLE / OUT-OF-POCKET: NOT MET)       Memory Argue Phone Number: 07/02/2019, 3:55 PM

## 2019-07-02 NOTE — Discharge Instructions (Addendum)
Follow with Primary MD Everlean Cherry, MD in 7 days   Get CBC, CMP, 2 view Chest X ray -  checked next visit within 1 week by Primary MD    Activity: As tolerated with Full fall precautions use walker/cane & assistance as needed  Disposition Home   Diet: Heart Healthy  Low Carb  Accuchecks 4 times/day, Once in AM empty stomach and then before each meal. Log in all results and show them to your Prim.MD in 3 days. If any glucose reading is under 80 or above 300 call your Prim MD immidiately. Follow Low glucose instructions for glucose under 80 as instructed.  Special Instructions: If you have smoked or chewed Tobacco  in the last 2 yrs please stop smoking, stop any regular Alcohol  and or any Recreational drug use.  On your next visit with your primary care physician please Get Medicines reviewed and adjusted.  Please request your Prim.MD to go over all Hospital Tests and Procedure/Radiological results at the follow up, please get all Hospital records sent to your Prim MD by signing hospital release before you go home.  If you experience worsening of your admission symptoms, develop shortness of breath, life threatening emergency, suicidal or homicidal thoughts you must seek medical attention immediately by calling 911 or calling your MD immediately  if symptoms less severe.  You Must read complete instructions/literature along with all the possible adverse reactions/side effects for all the Medicines you take and that have been prescribed to you. Take any new Medicines after you have completely understood and accpet all the possible adverse reactions/side effects.       Person Under Monitoring Name: Shelly Shields  Location: [address] [place] [address]   Infection Prevention Recommendations for Individuals Confirmed to have, or Being Evaluated for, 2019 Novel Coronavirus (COVID-19) Infection Who Receive Care at Home  Individuals who are confirmed to have, or are being  evaluated for, COVID-19 should follow the prevention steps below until a healthcare provider or local or state health department says they can return to normal activities.  Stay home except to get medical care You should restrict activities outside your home, except for getting medical care. Do not go to work, school, or public areas, and do not use public transportation or taxis.  Call ahead before visiting your doctor Before your medical appointment, call the healthcare provider and tell them that you have, or are being evaluated for, COVID-19 infection. This will help the healthcare provider's office take steps to keep other people from getting infected. Ask your healthcare provider to call the local or state health department.  Monitor your symptoms Seek prompt medical attention if your illness is worsening (e.g., difficulty breathing). Before going to your medical appointment, call the healthcare provider and tell them that you have, or are being evaluated for, COVID-19 infection. Ask your healthcare provider to call the local or state health department.  Wear a facemask You should wear a facemask that covers your nose and mouth when you are in the same room with other people and when you visit a healthcare provider. People who live with or visit you should also wear a facemask while they are in the same room with you.  Separate yourself from other people in your home As much as possible, you should stay in a different room from other people in your home. Also, you should use a separate bathroom, if available.  Avoid sharing household items You should not share dishes,  drinking glasses, cups, eating utensils, towels, bedding, or other items with other people in your home. After using these items, you should wash them thoroughly with soap and water.  Cover your coughs and sneezes Cover your mouth and nose with a tissue when you cough or sneeze, or you can cough or sneeze into your  sleeve. Throw used tissues in a lined trash can, and immediately wash your hands with soap and water for at least 20 seconds or use an alcohol-based hand rub.  Wash your [facility] your hands often and thoroughly with soap and water for at least 20 seconds. You can use an alcohol-based hand sanitizer if soap and water are not available and if your hands are not visibly dirty. Avoid touching your eyes, nose, and mouth with unwashed hands.   Prevention Steps for Caregivers and Household Members of Individuals Confirmed to have, or Being Evaluated for, COVID-19 Infection Being Cared for in the Home  If you live with, or provide care at home for, a person confirmed to have, or being evaluated for, COVID-19 infection please follow these guidelines to prevent infection:  Follow healthcare provider's instructions Make sure that you understand and can help the patient follow any healthcare provider instructions for all care.  Provide for the patient's basic needs You should help the patient with basic needs in the home and provide support for getting groceries, prescriptions, and other personal needs.  Monitor the patient's symptoms If they are getting sicker, call his or her medical provider and tell them that the patient has, or is being evaluated for, COVID-19 infection. This will help the healthcare provider's office take steps to keep other people from getting infected. Ask the healthcare provider to call the local or state health department.  Limit the number of people who have contact with the patient  If possible, have only one caregiver for the patient.  Other household members should stay in another home or place of residence. If this is not possible, they should stay  in another room, or be separated from the patient as much as possible. Use a separate bathroom, if available.  Restrict visitors who do not have an essential need to be in the home.  Keep older adults, very  young children, and other sick people away from the patient Keep older adults, very young children, and those who have compromised immune systems or chronic health conditions away from the patient. This includes people with chronic heart, lung, or kidney conditions, diabetes, and cancer.  Ensure good ventilation Make sure that shared spaces in the home have good air flow, such as from an air conditioner or an opened window, weather permitting.  Wash your hands often  Wash your hands often and thoroughly with soap and water for at least 20 seconds. You can use an alcohol based hand sanitizer if soap and water are not available and if your hands are not visibly dirty.  Avoid touching your eyes, nose, and mouth with unwashed hands.  Use disposable paper towels to dry your hands. If not available, use dedicated cloth towels and replace them when they become wet.  Wear a facemask and gloves  Wear a disposable facemask at all times in the room and gloves when you touch or have contact with the patient's blood, body fluids, and/or secretions or excretions, such as sweat, saliva, sputum, nasal mucus, vomit, urine, or feces.  Ensure the mask fits over your nose and mouth tightly, and do not touch it during use.  Throw out disposable facemasks and gloves after using them. Do not reuse.  Wash your hands immediately after removing your facemask and gloves.  If your personal clothing becomes contaminated, carefully remove clothing and launder. Wash your hands after handling contaminated clothing.  Place all used disposable facemasks, gloves, and other waste in a lined container before disposing them with other household waste.  Remove gloves and wash your hands immediately after handling these items.  Do not share dishes, glasses, or other household items with the patient  Avoid sharing household items. You should not share dishes, drinking glasses, cups, eating utensils, towels, bedding, or other  items with a patient who is confirmed to have, or being evaluated for, COVID-19 infection.  After the person uses these items, you should wash them thoroughly with soap and water.  Wash laundry thoroughly  Immediately remove and wash clothes or bedding that have blood, body fluids, and/or secretions or excretions, such as sweat, saliva, sputum, nasal mucus, vomit, urine, or feces, on them.  Wear gloves when handling laundry from the patient.  Read and follow directions on labels of laundry or clothing items and detergent. In general, wash and dry with the warmest temperatures recommended on the label.  Clean all areas the individual has used often  Clean all touchable surfaces, such as counters, tabletops, doorknobs, bathroom fixtures, toilets, phones, keyboards, tablets, and bedside tables, every day. Also, clean any surfaces that may have blood, body fluids, and/or secretions or excretions on them.  Wear gloves when cleaning surfaces the patient has come in contact with.  Use a diluted bleach solution (e.g., dilute bleach with 1 part bleach and 10 parts water) or a household disinfectant with a label that says EPA-registered for coronaviruses. To make a bleach solution at home, add 1 tablespoon of bleach to 1 quart (4 cups) of water. For a larger supply, add  cup of bleach to 1 gallon (16 cups) of water.  Read labels of cleaning products and follow recommendations provided on product labels. Labels contain instructions for safe and effective use of the cleaning product including precautions you should take when applying the product, such as wearing gloves or eye protection and making sure you have good ventilation during use of the product.  Remove gloves and wash hands immediately after cleaning.  Monitor yourself for signs and symptoms of illness Caregivers and household members are considered close contacts, should monitor their health, and will be asked to limit movement outside of  the home to the extent possible. Follow the monitoring steps for close contacts listed on the symptom monitoring form.   ? If you have additional questions, contact your local health department or call the epidemiologist on call at [phone] (available 24/7). ? This guidance is subject to change. For the most up-to-date guidance from [facility], please refer to their website: [email or web address][employer]hu Goals for meeting Diabetes targets 1. Contact PCP for endo referral and make appointment (ask about Fre[facility] [place]). 2. Take prescriptions as prescribed. 3. Try to remember to check blood sugars and take with you to next appointment. 4. Work on eliminating sugary beverages from diet.    Local Endocrinologists [facility] [phone]) 1. Dr. Carlus Pavlov 2. Dr. Reather Littler 3. [facility] [phone]) 1. Dr. Talmage Coin  [facility] [phone]) 1. Dr. Dorisann Frames   2. Dr. Darci Needle [facility] [phone]) 1. Dr. Deirdre Pippins [facility] [phone]) [[place] office]  [phone]) [[place] office] 1. Dr. Efraim Kaufmann [place] 2. Dr. Maisie Fus  [facility]) [phone]) 1. Autumn Hudnall [place]), PA 2. Dr. Izell [place] 3. Dr. Jillyn Ledger. [facility] [phone]) 1. Dr. Marquis Lunch [facility] [phone]) 1. Dr. Jerelyn Scott 2. Dr. Dessa Phi 3. Dr. Judene Companion 4. Barron Alvine, FNP Dr. Girtha Hake. [facility] in [place] [phone])  Preventing Diabetes Mellitus Complications You can take action to prevent or slow down problems that are caused by diabetes (diabetes mellitus). Following your diabetes plan and taking care of yourself can reduce your risk of serious or life-threatening complications. What actions can I take  to prevent diabetes complications? Manage your diabetes   Follow instructions from your health care providers about managing your diabetes. Your diabetes may be managed by a team of health care providers who can teach you how to care for yourself and can answer questions that you have.  Educate yourself about your condition so you can make healthy choices about eating and physical activity.  Check your blood sugar (glucose) levels as often as directed. Your health care provider will help you decide how often to check your blood glucose level depending on your treatment goals and how well you are meeting them.  Ask your health care provider if you should take low-dose aspirin daily and what dose is recommended for you. Taking low-dose aspirin daily is recommended to help prevent cardiovascular disease. Do not use nicotine or tobacco Do not use any products that contain nicotine or tobacco, such as cigarettes and e-cigarettes. If you need help quitting, ask your health care provider. Nicotine raises your risk for diabetes problems. If you quit using nicotine:  You will lower your risk for heart attack, stroke, nerve disease, and kidney disease.  Your cholesterol and blood pressure may improve.  Your blood circulation will improve. Keep your blood pressure under control Your personal target blood pressure is determined based on:  Your age.  Your medicines.  How long you have had diabetes.  Any other medical conditions you have. To control your blood pressure:  Follow instructions from your health care provider about meal planning, exercise, and medicines.  Make sure your health care provider checks your blood pressure at every medical visit.  Monitor your blood pressure at home as told by your health care provider.  Keep your cholesterol under control To control your cholesterol:  Follow instructions from your health care provider about meal planning, exercise, and  medicines.  Have your cholesterol checked at least once a year.  You may be prescribed medicine to lower cholesterol (statin). If you are not taking a statin, ask your health care provider if you should be. Controlling your cholesterol may:  Help prevent heart disease and stroke. These are the most common health problems for people with diabetes.  Improve your blood flow. Schedule and keep yearly physical exams and eye exams Your health care provider will tell you how often you need medical visits depending on your diabetes management plan. Keep all follow-up visits as directed. This is important so possible problems can be identified early and complications can be avoided or treated.  Every visit with your health care provider should include measuring your: ? Weight. ? Blood pressure. ? Blood glucose control.  Your A1c (hemoglobin A1c) level should be checked: ? At least 2 times a year, if you are meeting your treatment goals. ? 4 times a year, if you are not meeting treatment goals or if your treatment goals have changed.  Your blood lipids (lipid profile) should be checked yearly. You should also be checked yearly for protein in your urine (urine microalbumin).  If you have type 1 diabetes, get an eye exam 3-5 years after you are diagnosed, and then once a year after your first exam.  If you have type 2 diabetes, get an eye exam as soon as you are diagnosed, and then once a year after your first exam. Keep your vaccines current It is recommended that you receive:  A flu (influenza) vaccine every year.  A pneumonia (pneumococcal) vaccine and a hepatitis B vaccine. If you are age 71 or older, you may get the pneumonia vaccine as a series of two separate shots. Ask your health care provider which other vaccines may be recommended. Take care of your feet Diabetes may cause you to have poor blood circulation to your legs and feet. Because of this, taking care of your feet is very  important. Diabetes can cause:  The skin on the feet to get thinner, break more easily, and heal more slowly.  Nerve damage in your legs and feet, which results in decreased feeling. You may not notice minor injuries that could lead to serious problems. To avoid foot problems:  Check your skin and feet every day for cuts, bruises, redness, blisters, or sores.  Schedule a foot exam with your health care provider once every year. This exam includes: ? Inspecting of the structure and skin of your feet. ? Checking the pulses and sensation in your feet.  Make sure that your health care provider performs a visual foot exam at every medical visit.  Take care of your teeth People with poorly controlled diabetes are more likely to have gum (periodontal) disease. Diabetes can make periodontal diseases harder to control. If not treated, periodontal diseases can lead to tooth loss. To prevent this:  Brush your teeth twice a day.  Floss at least once a day.  Visit your dentist 2 times a year. Drink responsibly Limit alcohol intake to no more than 1 drink a day for nonpregnant women and 2 drinks a day for men. One drink equals 12 oz of beer, 5 oz of wine, or 1 oz of hard liquor.  It is important to eat food when you drink alcohol to avoid low blood glucose (hypoglycemia). Avoid alcohol if you:  Have a history of alcohol abuse or dependence.  Are pregnant.  Have liver disease, pancreatitis, advanced neuropathy, or severe hypertriglyceridemia. Lessen stress Living with diabetes can be stressful. When you are experiencing stress, your blood glucose may be affected in two ways:  Stress hormones may cause your blood glucose to rise.  You may be distracted from taking good care of yourself. Be aware of your stress level and make changes to help you manage challenging situations. To lower your stress levels:  Consider joining a support group.  Do planned relaxation or meditation.  Do a hobby  that you enjoy.  Maintain healthy relationships.  Exercise regularly.  Work with your health care provider or a mental health professional. Summary  You can take action to prevent or slow down problems that are caused by diabetes (diabetes mellitus). Following your diabetes plan and taking care of yourself can reduce your risk of serious or life-threatening complications.  Follow instructions from your health care providers about managing your diabetes. Your diabetes may be managed by a team of health care providers who can teach you how to care for yourself and can answer questions that  you have.  Your health care provider will tell you how often you need medical visits depending on your diabetes management plan. Keep all follow-up visits as directed. This is important so possible problems can be identified early and complications can be avoided or treated. This information is not intended to replace advice given to you by your health care provider. Make sure you discuss any questions you have with your health care provider. Document Revised: 08/11/2017 Document Reviewed: 02/10/2016 Elsevier Patient Education  2020 [employer]. Blood Glucose Monitoring, Adult Monitoring your blood sugar (glucose) is an important part of managing your diabetes (diabetes mellitus). Blood glucose monitoring involves checking your blood glucose as often as directed and keeping a record (log) of your results over time. Checking your blood glucose regularly and keeping a blood glucose log can:  Help you and your health care provider adjust your diabetes management plan as needed, including your medicines or insulin.  Help you understand how food, exercise, illnesses, and medicines affect your blood glucose.  Let you know what your blood glucose is at any time. You can quickly find out if you have low blood glucose (hypoglycemia) or high blood glucose (hyperglycemia). Your health care provider will set  individualized treatment goals for you. Your goals will be based on your age, other medical conditions you have, and how you respond to diabetes treatment. Generally, the goal of treatment is to maintain the following blood glucose levels:  Before meals (preprandial): 80-130 mg/dL (2.6-3.7 mmol/L).  After meals (postprandial): below 180 mg/dL (10 mmol/L).  A1c level: less than 7%. Supplies needed:  Blood glucose meter.  Test strips for your meter. Each meter has its own strips. You must use the strips that came with your meter.  A needle to prick your finger (lancet). Do not use a lancet more than one time.  A device that holds the lancet (lancing device).  A journal or log book to write down your results. How to check your blood glucose  1. Wash your hands with soap and water. 2. Prick the side of your finger (not the tip) with the lancet. Use a different finger each time. 3. Gently rub the finger until a small drop of blood appears. 4. Follow instructions that come with your meter for inserting the test strip, applying blood to the strip, and using your blood glucose meter. 5. Write down your result and any notes. Some meters allow you to use areas of your body other than your finger (alternative sites) to test your blood. The most common alternative sites are:  Forearm.  Thigh.  Palm of the hand. If you think you may have hypoglycemia, or if you have a history of not knowing when your blood glucose is getting low (hypoglycemia unawareness), do not use alternative sites. Use your finger instead. Alternative sites may not be as accurate as the fingers, because blood flow is slower in these areas. This means that the result you get may be delayed, and it may be different from the result that you would get from your finger. Follow these instructions at home: Blood glucose log   Every time you check your blood glucose, write down your result. Also write down any notes about things  that may be affecting your blood glucose, such as your diet and exercise for the day. This information can help you and your health care provider: ? Look for patterns in your blood glucose over time. ? Adjust your diabetes management plan as needed.  Check if your meter allows you to download your records to a computer. Most glucose meters store a record of glucose readings in the meter. If you have type 1 diabetes:  Check your blood glucose 2 or more times a day.  Also check your blood glucose: ? Before every insulin injection. ? Before and after exercise. ? Before meals. ? 2 hours after a meal. ? Occasionally between 2:00 a.m. and 3:00 a.m., as directed. ? Before potentially dangerous tasks, like driving or using heavy machinery. ? At bedtime.  You may need to check your blood glucose more often, up to 6-10 times a day, if you: ? Use an insulin pump. ? Need multiple daily injections (MDI). ? Have diabetes that is not well-controlled. ? Are ill. ? Have a history of severe hypoglycemia. ? Have hypoglycemia unawareness. If you have type 2 diabetes:  If you take insulin or other diabetes medicines, check your blood glucose 2 or more times a day.  If you are on intensive insulin therapy, check your blood glucose 4 or more times a day. Occasionally, you may also need to check between 2:00 a.m. and 3:00 a.m., as directed.  Also check your blood glucose: ? Before and after exercise. ? Before potentially dangerous tasks, like driving or using heavy machinery.  You may need to check your blood glucose more often if: ? Your medicine is being adjusted. ? Your diabetes is not well-controlled. ? You are ill. General tips  Always keep your supplies with you.  If you have questions or need help, all blood glucose meters have a 24-hour "hotline" phone number that you can call. You may also contact your health care provider.  After you use a few boxes of test strips, adjust (calibrate)  your blood glucose meter by following instructions that came with your meter. Contact a health care provider if:  Your blood glucose is at or above 240 mg/dL (67.6 mmol/L) for 2 days in a row.  You have been sick or have had a fever for 2 days or longer, and you are not getting better.  You have any of the following problems for more than 6 hours: ? You cannot eat or drink. ? You have nausea or vomiting. ? You have diarrhea. Get help right away if:  Your blood glucose is lower than 54 mg/dL (3 mmol/L).  You become confused or you have trouble thinking clearly.  You have difficulty breathing.  You have moderate or large ketone levels in your urine. Summary  Monitoring your blood sugar (glucose) is an important part of managing your diabetes (diabetes mellitus).  Blood glucose monitoring involves checking your blood glucose as often as directed and keeping a record (log) of your results over time.  Your health care provider will set individualized treatment goals for you. Your goals will be based on your age, other medical conditions you have, and how you respond to diabetes treatment.  Every time you check your blood glucose, write down your result. Also write down any notes about things that may be affecting your blood glucose, such as your diet and exercise for the day. This information is not intended to replace advice given to you by your health care provider. Make sure you discuss any questions you have with your health care provider. Document Revised: 03/06/2018 Document Reviewed: 10/23/2015 Elsevier Patient Education  2020 [employer] Hyperglycemia Hyperglycemia occurs when the level of sugar (glucose) in the blood is too high. Glucose is a type of sugar  that provides the body's main source of energy. Certain hormones (insulin and glucagon) control the level of glucose in the blood. Insulin lowers blood glucose, and glucagon increases blood glucose. Hyperglycemia can result  from having too little insulin in the bloodstream, or from the body not responding normally to insulin. Hyperglycemia occurs most often in people who have diabetes (diabetes mellitus), but it can happen in people who do not have diabetes. It can develop quickly, and it can be life-threatening if it causes you to become severely dehydrated (diabetic ketoacidosis or hyperglycemic hyperosmolar state). Severe hyperglycemia is a medical emergency. What are the causes? If you have diabetes, hyperglycemia may be caused by:  Diabetes medicine.  Medicines that increase blood glucose or affect your diabetes control.  Not eating enough, or not eating often enough.  Changes in physical activity level.  Being sick or having an infection. If you have prediabetes or undiagnosed diabetes:  Hyperglycemia may be caused by those conditions. If you do not have diabetes, hyperglycemia may be caused by:  Certain medicines, including steroid medicines, beta-blockers, epinephrine, and thiazide diuretics.  Stress.  Serious illness.  Surgery.  Diseases of the pancreas.  Infection. What increases the risk? Hyperglycemia is more likely to develop in people who have risk factors for diabetes, such as:  Having a family member with diabetes.  Having a gene for type 1 diabetes that is passed from parent to child (inherited).  Living in an area with cold weather conditions.  Exposure to certain viruses.  Certain conditions in which the body's disease-fighting (immune) system attacks itself (autoimmune disorders).  Being overweight or obese.  Having an inactive (sedentary) lifestyle.  Having been diagnosed with insulin resistance.  Having a history of prediabetes, gestational diabetes, or polycystic ovarian syndrome (PCOS).  Being of American-Indian, African-American, Hispanic/Latino, or Asian/Pacific Islander descent. What are the signs or symptoms? Hyperglycemia may not cause any symptoms. If  you do have symptoms, they may include early warning signs, such as:  Increased thirst.  Hunger.  Feeling very tired.  Needing to urinate more often than usual.  Blurry vision. Other symptoms may develop if hyperglycemia gets worse, such as:  Dry mouth.  Loss of appetite.  Fruity-smelling breath.  Weakness.  Unexpected or rapid weight gain or weight loss.  Tingling or numbness in the hands or feet.  Headache.  Skin that does not quickly return to normal after being lightly pinched and released (poor skin turgor).  Abdominal pain.  Cuts or bruises that are slow to heal. How is this diagnosed? Hyperglycemia is diagnosed with a blood test to measure your blood glucose level. This blood test is usually done while you are having symptoms. Your health care provider may also do a physical exam and review your medical history. You may have more tests to determine the cause of your hyperglycemia, such as:  A fasting blood glucose (FBG) test. You will not be allowed to eat (you will fast) for at least 8 hours before a blood sample is taken.  An A1c (hemoglobin A1c) blood test. This provides information about blood glucose control over the previous 2-3 months.  An oral glucose tolerance test (OGTT). This measures your blood glucose at two times: ? After fasting. This is your baseline blood glucose level. ? Two hours after drinking a beverage that contains glucose. How is this treated? Treatment depends on the cause of your hyperglycemia. Treatment may include:  Taking medicine to regulate your blood glucose levels. If you take insulin or  other diabetes medicines, your medicine or dosage may be adjusted.  Lifestyle changes, such as exercising more, eating healthier foods, or losing weight.  Treating an illness or infection, if this caused your hyperglycemia.  Checking your blood glucose more often.  Stopping or reducing steroid medicines, if these caused your  hyperglycemia. If your hyperglycemia becomes severe and it results in hyperglycemic hyperosmolar state, you must be hospitalized and given IV fluids. Follow these instructions at home:  General instructions  Take over-the-counter and prescription medicines only as told by your health care provider.  Do not use any products that contain nicotine or tobacco, such as cigarettes and e-cigarettes. If you need help quitting, ask your health care provider.  Limit alcohol intake to no more than 1 drink per day for nonpregnant women and 2 drinks per day for men. One drink equals 12 oz of beer, 5 oz of wine, or 1 oz of hard liquor.  Learn to manage stress. If you need help with this, ask your health care provider.  Keep all follow-up visits as told by your health care provider. This is important. Eating and drinking   Maintain a healthy weight.  Exercise regularly, as directed by your health care provider.  Stay hydrated, especially when you exercise, get sick, or spend time in hot temperatures.  Eat healthy foods, such as: ? Lean proteins. ? Complex carbohydrates. ? Fresh fruits and vegetables. ? Low-fat dairy products. ? Healthy fats.  Drink enough fluid to keep your urine clear or pale yellow. If you have diabetes:  Make sure you know the symptoms of hyperglycemia.  Follow your diabetes management plan, as told by your health care provider. Make sure you: ? Take your insulin and medicines as directed. ? Follow your exercise plan. ? Follow your meal plan. Eat on time, and do not skip meals. ? Check your blood glucose as often as directed. Make sure to check your blood glucose before and after exercise. If you exercise longer or in a different way than usual, check your blood glucose more often. ? Follow your sick day plan whenever you cannot eat or drink normally. Make this plan in advance with your health care provider.  Share your diabetes management plan with people in your  workplace, school, and household.  Check your urine for ketones when you are ill and as told by your health care provider.  Carry a medical alert card or wear medical alert jewelry. Contact a health care provider if:  Your blood glucose is at or above 240 mg/dL (69.6 mmol/L) for 2 days in a row.  You have problems keeping your blood glucose in your target range.  You have frequent episodes of hyperglycemia. Get help right away if:  You have difficulty breathing.  You have a change in how you think, feel, or act (mental status).  You have nausea or vomiting that does not go away. These symptoms may represent a serious problem that is an emergency. Do not wait to see if the symptoms will go away. Get medical help right away. Call your local emergency services (911 in the [place] Do not drive yourself to the hospital. Summary  Hyperglycemia occurs when the level of sugar (glucose) in the blood is too high.  Hyperglycemia is diagnosed with a blood test to measure your blood glucose level. This blood test is usually done while you are having symptoms. Your health care provider may also do a physical exam and review your medical history.  If you  have diabetes, follow your diabetes management plan as told by your health care provider.  Contact your health care provider if you have problems keeping your blood glucose in your target range. This information is not intended to replace advice given to you by your health care provider. Make sure you discuss any questions you have with your health care provider. Document Revised: 01/29/2016 Document Reviewed: 01/29/2016 Elsevier Patient Education  [place]. Hemoglobin A1c Test Why am I having this test? You may have the hemoglobin A1c test (HbA1c test) done to:  Evaluate your risk for developing diabetes (diabetes mellitus).  Diagnose diabetes.  Monitor long-term control of blood sugar (glucose) in people who have diabetes and help  make treatment decisions. This test may be done with other blood glucose tests, such as fasting blood glucose and oral glucose tolerance tests. What is being tested? Hemoglobin is a type of protein in the blood that carries oxygen. Glucose attaches to hemoglobin to form glycated hemoglobin. This test checks the amount of glycated hemoglobin in your blood, which is a good indicator of the average amount of glucose in your blood during the past 2-3 months. What kind of sample is taken?  A blood sample is required for this test. It is usually collected by inserting a needle into a blood vessel. Tell a health care provider about:  All medicines you are taking, including vitamins, herbs, eye drops, creams, and over-the-counter medicines.  Any blood disorders you have.  Any surgeries you have had.  Any medical conditions you have.  Whether you are pregnant or may be pregnant. How are the results reported? Your results will be reported as a percentage that indicates how much of your hemoglobin has glucose attached to it (is glycated). Your health care provider will compare your results to normal ranges that were established after testing a large group of people (reference ranges). Reference ranges may vary among labs and hospitals. For this test, common reference ranges are:  Adult or child without diabetes: 4-5.6%.  Adult or child with diabetes and good blood glucose control: less than 7%. What do the results mean? If you have diabetes:  A result of less than 7% is considered normal, meaning that your blood glucose is well controlled.  A result higher than 7% means that your blood glucose is not well controlled, and your treatment plan may need to be adjusted. If you do not have diabetes:  A result within the reference range is considered normal, meaning that you are not at high risk for diabetes.  A result of 5.7-6.4% means that you have a high risk of developing diabetes, and you may  have prediabetes. Prediabetes is the condition of having a blood glucose level that is higher than it should be, but not high enough for you to be diagnosed with diabetes. Having prediabetes puts you at risk for developing type 2 diabetes (type 2 diabetes mellitus). You may have more tests, including a repeat HbA1c test.  Results of 6.5% or higher on two separate HbA1c tests mean that you have diabetes. You may have more tests to confirm the diagnosis. Abnormally low HbA1c values may be caused by:  Pregnancy.  Severe blood loss.  Receiving donated blood (transfusions).  Low red blood cell count (anemia).  Long-term kidney failure.  Some unusual forms (variants) of hemoglobin. Talk with your health care provider about what your results mean. Questions to ask your health care provider Ask your health care provider, or the department that  is doing the test:  When will my results be ready?  How will I get my results?  What are my treatment options?  What other tests do I need?  What are my next steps? Summary  The hemoglobin A1c test (HbA1c test) may be done to evaluate your risk for developing diabetes, to diagnose diabetes, and to monitor long-term control of blood sugar (glucose) in people who have diabetes and help make treatment decisions.  Hemoglobin is a type of protein in the blood that carries oxygen. Glucose attaches to hemoglobin to form glycated hemoglobin. This test checks the amount of glycated hemoglobin in your blood, which is a good indicator of the average amount of glucose in your blood during the past 2-3 months.  Talk with your health care provider about what your results mean. This information is not intended to replace advice given to you by your health care provider. Make sure you discuss any questions you have with your health care provider. Document Revised: 04/25/2017 Document Reviewed: 12/24/2016 Elsevier Patient Education  2020 [employer].  Information on my medicine - ELIQUIS (apixaban)  Why was Eliquis prescribed for you? Eliquis was prescribed to treat blood clots that may have been found in the veins of your legs (deep vein thrombosis) or in your lungs (pulmonary embolism) and to reduce the risk of them occurring again.  What do You need to know about Eliquis ? The starting dose is 10 mg (two 5 mg tablets) taken TWICE daily for the FIRST SEVEN (7) DAYS, then on 07/08/2019 the dose is reduced to ONE 5 mg tablet taken TWICE daily.  Eliquis may be taken with or without food.   Try to take the dose about the same time in the morning and in the evening. If you have difficulty swallowing the tablet whole please discuss with your pharmacist how to take the medication safely.  Take Eliquis exactly as prescribed and DO NOT stop taking Eliquis without talking to the doctor who prescribed the medication.  Stopping may increase your risk of developing a new blood clot.  Refill your prescription before you run out.  After discharge, you should have regular check-up appointments with your healthcare provider that is prescribing your Eliquis.    What do you do if you miss a dose? If a dose of ELIQUIS is not taken at the scheduled time, take it as soon as possible on the same day and twice-daily administration should be resumed. The dose should not be doubled to make up for a missed dose.  Important Safety Information A possible side effect of Eliquis is bleeding. You should call your healthcare provider right away if you experience any of the following: ? Bleeding from an injury or your nose that does not stop. ? Unusual colored urine (red or dark brown) or unusual colored stools (red or black). ? Unusual bruising for unknown reasons. ? A serious fall or if you hit your head (even if there is no bleeding).  Some medicines may interact with Eliquis and might increase your risk of bleeding or clotting while on Eliquis. To  help avoid this, consult your healthcare provider or pharmacist prior to using any new prescription or non-prescription medications, including herbals, vitamins, non-steroidal anti-inflammatory drugs (NSAIDs) and supplements.  This website has more information on Eliquis (apixaban): http://www.eliquis.com/eliquis/home

## 2019-07-02 NOTE — Progress Notes (Signed)
Patient scheduled for outpatient Remdesivir infusion at 10:00 AM on Sunday 2/7.  Please advise them to report to [facility] at [address]  Drive to the security guard and tell them you are here for an infusion. They will direct you to the front entrance where we will come and get you.  For questions call [phone].  Thanks

## 2019-07-02 NOTE — TOC Initial Note (Signed)
Transition of Care [facility]) - Initial/Assessment Note    Patient Details  Name: SAFIA PANZER MRN: [id number] Date of Birth: 08/17/1980  Transition of [facility]) CM/SW Contact:    Cherylann Parr, RN Phone Number: 07/02/2019, 3:11 PM  Clinical Narrative:  PTA independent from home .  Pt informed CM that she works at [employer] - pt confirms she doesn't have medicaid nor medicare.  Pt will discharge home on Elqiuis.  Pt informed CM that she has already found the reduced copay card for Eliquis.  CM printed free 30 day coupon to unit nurse station - nurse to give directly to pt.  Benefit check submitted                Expected Discharge Plan: Home/Self Care Barriers to Discharge: Continued Medical Work up   Patient Goals and CMS Choice        Expected Discharge Plan and Services Expected Discharge Plan: Home/Self Care       Living arrangements for the past 2 months: Single Family Home                                      Prior Living Arrangements/Services Living arrangements for the past 2 months: Single Family Home   Patient language and need for interpreter reviewed:: Yes Do you feel safe going back to the place where you live?: Yes      Need for Family Participation in Patient Care: No (Comment) Care giver support system in place?: Yes (comment)   Criminal Activity/Legal Involvement Pertinent to Current Situation/Hospitalization: No - Comment as needed  Activities of Daily Living Home Assistive Devices/Equipment: CPAP ADL Screening (condition at time of admission) Patient's cognitive ability adequate to safely complete daily activities?: Yes Is the patient deaf or have difficulty hearing?: No Does the patient have difficulty seeing, even when wearing glasses/contacts?: No Does the patient have difficulty concentrating, remembering, or making decisions?: No Patient able to express need for assistance with ADLs?: Yes Does the patient have difficulty dressing  or bathing?: No Independently performs ADLs?: Yes (appropriate for developmental age) Does the patient have difficulty walking or climbing stairs?: No Weakness of Legs: None Weakness of Arms/Hands: None  Permission Sought/Granted                  Emotional Assessment   Attitude/Demeanor/Rapport: Gracious, Charismatic, Self-Confident, Engaged Affect (typically observed): Accepting Orientation: : Oriented to Self, Oriented to Place, Oriented to  Time, Oriented to Situation   Psych Involvement: No (comment)  Admission diagnosis:  Shortness of breath [R06.02] Bilateral pulmonary embolism (HCC) [I26.99] Acute pulmonary embolism (HCC) [I26.99] COVID-19 virus infection [U07.1] Patient Active Problem List   Diagnosis Date Noted  . Acute pulmonary embolism (HCC) 06/30/2019  . Hypernatremia   . Asthma, chronic   . DKA (diabetic ketoacidoses) (HCC) 11/27/2014  . Diabetes mellitus, new onset (HCC)   . Hyperglycemic hyperosmolar nonketotic coma (HCC)   . Essential hypertension   . Asthma    PCP:  Everlean Cherry, MD Pharmacy:   [facility] [phone] Ginette Otto, [place] - [address] AT [facility] OF [address] & [address] [place] [address] Phone: [phone] Fax: [phone]  [facility] [place], [place] - [address] [place] [address] Phone: [phone] Fax: [phone]     Social Determinants of  Health (SDOH) Interventions    Readmission Risk Interventions No flowsheet data found.

## 2019-07-02 NOTE — Progress Notes (Signed)
PROGRESS NOTE                                                                                                                                                                                                             Patient Demographics:    Shelly Shields, is a 39 y.o. female, DOB - May 14, 1981, ZLD:[id number]  Outpatient Primary MD for the patient is [name], MD    LOS - 2  Admit date - 06/29/2019    Chief Complaint  Patient presents with  . COVID  . Shortness of Breath  . Cough       Brief Narrative  39 year old female, obese, with past medical history significant for OSA, diabetes mellitus, hypertension and migraine.  Apparently, patient was diagnosed with Covid on June 11, 2019.  Patient has been managed symptomatically at home, with reports of fever, viral syndrome shortness of breath, her work-up in the ER was suggestive of COVID-19 pneumonia along with bilateral PE.  She also had been undergoing treatment for right gluteal infection for the last several days and was on doxycycline.  She was admitted to the hospital for further care.   Subjective:   Patient in bed denies any headache chest or abdominal pain, mild shortness of breath on exertion.   Assessment  & Plan :     1. Acute Hypoxic Resp. Failure due to Acute Covid 19 Viral Pneumonitis during the ongoing 2020 Covid 19 Pandemic - she seems to have mild Covid disease and pneumonia, currently stable on room air, continue to taper down steroids, on IV remdesivir, will try and schedule her last dose in the outpatient setting if possible.    Encouraged the patient to sit up in chair in the daytime use I-S and flutter valve for pulmonary toiletry and then prone in bed when at night.    SpO2: 98 %  Recent Labs  Lab 06/30/19 0200 07/01/19 0420 07/02/19 0500  CRP 17.5* 12.0* 5.5*  DDIMER 5.54* 5.11* 6.09*  FERRITIN 413*  --   --   BNP  --  66.4 51.1   PROCALCITON 0.15  --   --     Hepatic Function Latest Ref Rng & Units 07/02/2019 07/01/2019 06/30/2019  Total Protein 6.5 - 8.1 g/dL 6.2(L) 6.4(L) 7.6  Albumin 3.5 - 5.0 g/dL 2.1(L) 2.1(L) 2.4(L)  AST 15 -  41 U/L '16 19 17  '$ ALT 0 - 44 U/L '21 18 17  '$ Alk Phosphatase 38 - 126 U/L 77 80 99  Total Bilirubin 0.3 - 1.2 mg/dL 0.9 0.9 1.3(H)    2.  Bilateral PE.  Likely due to inflammation caused by COVID-19 infection, lack of activity and patient being on Depo shots.  Currently on IV heparin drip, will discontinue Depo shots at the time of discharge.  Lower extremity venous duplex and echocardiogram are unremarkable.  3.  Obesity OSA.  Follow with PCP, nighttime oxygen if needed.  4.  Right gluteal skin infection.  No abscess on CT scan.  Continue doxycycline and monitor closely.    5. DM type II.  Placed on Lantus twice daily at home dose along with premeal NovoLog and high-dose sliding scale.  Will monitor and adjust.  A1c suggests extremely poor outpatient glycemic control due to hyperglycemia.  Provided with diabetic and insulin education.  Lab Results  Component Value Date   HGBA1C 11.7 (H) 06/30/2019    CBG (last 3)  Recent Labs    07/01/19 1720 07/01/19 2133 07/02/19 0752  GLUCAP 253* 255* 133*       Condition - Fair  Family Communication  :  None  Code Status :  Full  Diet :   Diet Order            Diet heart healthy/carb modified Room service appropriate? Yes; Fluid consistency: Thin  Diet effective now               Disposition Plan  : Home once she is stable from acute Covid and acute PE standpoint.  Trying to schedule last dose of her remdesivir infusion in the outpatient setting if possible.  Home Eliquis will be arranged via case management for the first month supply.  Consults  : None  Procedures  :    CT angiogram chest.  Bilateral PE  Lower extremity venous ultrasound.  No DVT  CT of pelvis.  Right gluteal induration without any fluid collection or  abscess.  TTE -  1. Left ventricular ejection fraction, by visual estimation, is 50 to  55%. The left ventricle has normal function. There is no increased left  ventricular wall thickness.  2. Global right ventricle has mildly reduced systolic function.The right  ventricular size is mildly enlarged.  3. Left ventricular diastolic parameters are indeterminate.  4. The mitral valve is normal in structure. Trivial mitral valve  regurgitation. No evidence of mitral stenosis.  5. The tricuspid valve was normal in structure. Tricuspid valve  regurgitation is trivial.   PUD Prophylaxis :   DVT Prophylaxis  :    Heparin gtt  Lab Results  Component Value Date   PLT 390 07/01/2019    Inpatient Medications  Scheduled Meds: . amLODipine  10 mg Oral Daily  . apixaban  10 mg Oral BID  . [START ON 07/08/2019] apixaban  5 mg Oral BID  . vitamin C  500 mg Oral Daily  . carvedilol  6.25 mg Oral BID WC  . dexamethasone  2 mg Oral Daily  . doxycycline  100 mg Oral BID  . folic acid  1 mg Oral Daily  . insulin aspart  0-20 Units Subcutaneous TID WC  . insulin aspart  0-5 Units Subcutaneous QHS  . insulin aspart  8 Units Subcutaneous TID WC  . insulin glargine  80 Units Subcutaneous BID  . Ipratropium-Albuterol  1 puff Inhalation Q6H  . multivitamin with  minerals  1 tablet Oral Daily  . rosuvastatin  10 mg Oral Once per day on Mon Thu  . topiramate  200 mg Oral QHS  . zinc sulfate  220 mg Oral Daily   Continuous Infusions: . remdesivir 100 mg in NS 100 mL 100 mg (07/02/19 0911)   PRN Meds:.acetaminophen, iohexol, ondansetron (ZOFRAN) IV, SUMAtriptan  Antibiotics  :    Anti-infectives (From admission, onward)   Start     Dose/Rate Route Frequency Ordered Stop   07/01/19 1000  remdesivir 100 mg in sodium chloride 0.9 % 100 mL IVPB  Status:  Discontinued     100 mg 200 mL/hr over 30 Minutes Intravenous Daily 06/30/19 0318 06/30/19 0402   07/01/19 1000  remdesivir 100 mg in sodium  chloride 0.9 % 100 mL IVPB     100 mg 200 mL/hr over 30 Minutes Intravenous Daily 06/30/19 0128 07/05/19 0959   06/30/19 0400  remdesivir 200 mg in sodium chloride 0.9% 250 mL IVPB  Status:  Discontinued     200 mg 580 mL/hr over 30 Minutes Intravenous Once 06/30/19 0318 06/30/19 0402   06/30/19 0330  doxycycline (VIBRA-TABS) tablet 100 mg     100 mg Oral 2 times daily 06/30/19 0318     06/30/19 0200  remdesivir 200 mg in sodium chloride 0.9% 250 mL IVPB     200 mg 580 mL/hr over 30 Minutes Intravenous Once 06/30/19 0128 06/30/19 0317       Time Spent in minutes  30   Lala Lund M.D on 07/02/2019 at 11:52 AM  To page go to [email or web address] - password [email or web address]  [facility] -  Office  [phone]    See all Orders from today for further details    Objective:   Vitals:   07/01/19 1728 07/01/19 2135 07/02/19 0547 07/02/19 0825  BP: 124/89 120/85 (!) 137/93 (!) 144/95  Pulse: 76 90 87 87  Resp:  18 18   Temp: 98.5 F (36.9 C) 98.5 F (36.9 C) 98.4 F (36.9 C)   TempSrc: Oral Oral Oral   SpO2: 91% 96% 98%   Height:        Wt Readings from Last 3 Encounters:  06/22/19 92.5 kg  03/27/18 102.1 kg  09/21/17 101.2 kg     Intake/Output Summary (Last 24 hours) at 07/02/2019 1152 Last data filed at 07/01/2019 1611 Gross per 24 hour  Intake 480 ml  Output --  Net 480 ml     Physical Exam  Awake Alert,  No new F.N deficits, Normal affect [place].AT,PERRAL Supple Neck,No JVD, No cervical lymphadenopathy appriciated.  Symmetrical Chest wall movement, Good air movement bilaterally, CTAB RRR,No Gallops, Rubs or new Murmurs, No Parasternal Heave +ve B.Sounds, Abd Soft, No tenderness, No organomegaly appriciated, No rebound - guarding or rigidity. No Cyanosis, Clubbing or edema,    Data Review:    CBC Recent Labs  Lab 06/29/19 1746 07/01/19 0420  WBC 13.2* 16.3*  HGB 12.5 11.3*  HCT 38.6 34.9*  PLT 367 390  MCV 89.8 88.8  MCH 29.1 28.8  MCHC 32.4 32.4  RDW 13.1  13.2  LYMPHSABS  --  3.6  MONOABS  --  1.1*  EOSABS  --  0.0  BASOSABS  --  0.0    Chemistries  Recent Labs  Lab 06/29/19 1746 06/30/19 0200 07/01/19 0420 07/02/19 0500  NA 133* 138 137 138  K 3.6 3.8 3.3* 3.5  CL 93* 97* 101 108  CO2 '25 24 23 '$ 20*  GLUCOSE 463* 324* 195* 153*  BUN '6 6 12 12  '$ CREATININE 0.81 0.77 0.77 0.75  CALCIUM 8.7* 8.9 9.0 9.1  MG  --   --  1.7 1.9  AST  --  '17 19 16  '$ ALT  --  '17 18 21  '$ ALKPHOS  --  99 80 77  BILITOT  --  1.3* 0.9 0.9   ------------------------------------------------------------------------------------------------------------------ No results for input(s): CHOL, HDL, LDLCALC, TRIG, CHOLHDL, LDLDIRECT in the last 72 hours.  Lab Results  Component Value Date   HGBA1C 11.7 (H) 06/30/2019   ------------------------------------------------------------------------------------------------------------------ No results for input(s): TSH, T4TOTAL, T3FREE, THYROIDAB in the last 72 hours.  Invalid input(s): FREET3  Cardiac Enzymes No results for input(s): CKMB, TROPONINI, MYOGLOBIN in the last 168 hours.  Invalid input(s): CK ------------------------------------------------------------------------------------------------------------------    Component Value Date/Time   BNP 51.1 07/02/2019 0500    Micro Results Recent Results (from the past 240 hour(s))  MRSA PCR Screening     Status: None   Collection Time: 06/30/19  1:54 PM   Specimen: Nasal Mucosa; Nasopharyngeal  Result Value Ref Range Status   MRSA by PCR NEGATIVE NEGATIVE Final    Comment: Performed at [facility], [place] [address], [place], [place] [address]    Radiology Reports CT Angio Chest PE W and/or Wo Contrast  Result Date: 06/29/2019 CLINICAL DATA:  Shortness of breath EXAM: CT ANGIOGRAPHY CHEST WITH CONTRAST TECHNIQUE: Multidetector CT imaging of the chest was performed using the standard protocol during bolus administration of intravenous contrast.  Multiplanar CT image reconstructions and MIPs were obtained to evaluate the vascular anatomy. CONTRAST:  7m OMNIPAQUE IOHEXOL 350 MG/ML SOLN COMPARISON:  None. FINDINGS: Cardiovascular: Contrast injection is sufficient to demonstrate satisfactory opacification of the pulmonary arteries to the segmental level.There are extensive acute bilateral pulmonary emboli involving the main left pulmonary artery extending into the lobar, segmental, and subsegmental branches bilaterally. There are lobar, segmental, and subsegmental pulmonary emboli involving the right lower lobe. Segmental and subsegmental pulmonary emboli are noted involving the right upper lobe. There is borderline right-sided heart strain with an RV LV ratio measuring approximately 0.9. There is however mild reflux of contrast in the IVC consistent with underlying cardiac dysfunction. Heart size is enlarged. Mediastinum/Nodes: --No mediastinal or hilar lymphadenopathy. --No axillary lymphadenopathy. --No supraclavicular lymphadenopathy. --Normal thyroid gland. --The esophagus is unremarkable Lungs/Pleura: Patchy bilateral ground-glass airspace opacities are noted bilaterally. There is no pneumothorax. No large pleural effusion. The trachea is unremarkable. Upper Abdomen: No acute abnormality. Musculoskeletal: No chest wall abnormality. No acute or significant osseous findings. Review of the MIP images confirms the above findings. IMPRESSION: 1. Extensive bilateral acute pulmonary emboli as detailed above. The RV/LV ratio is borderline measuring 0.9, however there is reflux of contrast in the IVC consistent with right-sided heart strain. 2. Diffuse bilateral ground-glass airspace opacities concerning for an atypical infectious process such as viral pneumonia. These results were called by telephone at the time of interpretation on 06/29/2019 at 11:22 pm to provider [facility], who verbally acknowledged these results. Electronically Signed   By: CConstance HolsterM.D.   On: 06/29/2019 23:26   CT PELVIS W CONTRAST  Result Date: 06/30/2019 CLINICAL DATA:  Right gluteal inflammation on prior CT, abscess EXAM: CT PELVIS WITH CONTRAST TECHNIQUE: Multidetector CT imaging of the pelvis was performed using the standard protocol following the bolus administration of intravenous contrast. CONTRAST:  1054mOMNIPAQUE IOHEXOL 300 MG/ML  SOLN COMPARISON:  06/22/2019 FINDINGS: Urinary Tract: There is  excreted contrast filling the urinary bladder without filling defect. Distal ureters are unremarkable. Bowel:  No bowel obstruction or ileus. Vascular/Lymphatic: No pathologically enlarged lymph nodes. No significant vascular abnormality seen. Reproductive:  No mass or other significant abnormality Other: Inflammatory changes seen within the medial inferior aspect of the right gluteal fold are again identified. There is no fluid collection or formed abscess. Musculoskeletal: No acute or destructive bony lesions. Reconstructed images demonstrate no additional findings. IMPRESSION: 1. Inflammatory changes within the medial inferior aspect of the right gluteal fold consistent with cellulitis. No fluid collection or formed abscess. Electronically Signed   By: Randa Ngo M.D.   On: 06/30/2019 11:13   CT ABDOMEN PELVIS W CONTRAST  Result Date: 06/22/2019 CLINICAL DATA:  Abscess to right buttock with drainage. EXAM: CT ABDOMEN AND PELVIS WITH CONTRAST TECHNIQUE: Multidetector CT imaging of the abdomen and pelvis was performed using the standard protocol following bolus administration of intravenous contrast. CONTRAST:  136m OMNIPAQUE IOHEXOL 300 MG/ML  SOLN COMPARISON:  January 12, 2016 FINDINGS: Lower chest: Mild to moderate severity patchy infiltrates are seen throughout both lung bases. Hepatobiliary: No focal liver abnormality is seen. No gallstones, gallbladder wall thickening, or biliary dilatation. Pancreas: Unremarkable. No pancreatic ductal dilatation or surrounding  inflammatory changes. Spleen: Normal in size without focal abnormality. Adrenals/Urinary Tract: Adrenal glands are unremarkable. Kidneys are normal, without renal calculi, focal lesion, or hydronephrosis. Bladder is unremarkable. Stomach/Bowel: Stomach is within normal limits. Appendix appears normal. No evidence of bowel wall thickening, distention, or inflammatory changes. Vascular/Lymphatic: Reproductive: Uterus and bilateral adnexa are unremarkable. Other: No abdominal wall hernia or abnormality. No abdominopelvic ascites. Musculoskeletal: Mild diffuse inflammatory fat stranding is seen along the gluteal region on the right. There is no evidence of associated fluid collection or abscess. No acute or significant osseous findings. IMPRESSION: 1. Mild inflammation involving the gluteal region on the right, without visualization of an associated fluid collection or abscess. 2. Mild to moderate severity patchy bibasilar infiltrates. Electronically Signed   By: TVirgina NorfolkM.D.   On: 06/22/2019 22:40   VAS [place]OWER EXTREMITY VENOUS (DVT)  Result Date: 06/30/2019  Lower Venous DVTStudy Indications: Pulmonary embolism.  Comparison Study: no prior Performing Technologist: MAbram SanderRVS  Examination Guidelines: A complete evaluation includes B-mode imaging, spectral Doppler, color Doppler, and power Doppler as needed of all accessible portions of each vessel. Bilateral testing is considered an integral part of a complete examination. Limited examinations for reoccurring indications may be performed as noted. The reflux portion of the exam is performed with the patient in reverse Trendelenburg.  +---------+---------------+---------+-----------+----------+--------------+ RIGHT    CompressibilityPhasicitySpontaneityPropertiesThrombus Aging +---------+---------------+---------+-----------+----------+--------------+ CFV      Full           Yes      Yes                                  +---------+---------------+---------+-----------+----------+--------------+ SFJ      Full                                                        +---------+---------------+---------+-----------+----------+--------------+ FV Prox  Full                                                        +---------+---------------+---------+-----------+----------+--------------+  FV Mid   Full                                                        +---------+---------------+---------+-----------+----------+--------------+ FV DistalFull                                                        +---------+---------------+---------+-----------+----------+--------------+ PFV      Full                                                        +---------+---------------+---------+-----------+----------+--------------+ POP      Full           Yes      Yes                                 +---------+---------------+---------+-----------+----------+--------------+ PTV      Full                                                        +---------+---------------+---------+-----------+----------+--------------+ PERO     Full                                                        +---------+---------------+---------+-----------+----------+--------------+   +---------+---------------+---------+-----------+----------+--------------+ LEFT     CompressibilityPhasicitySpontaneityPropertiesThrombus Aging +---------+---------------+---------+-----------+----------+--------------+ CFV      Full           Yes      Yes                                 +---------+---------------+---------+-----------+----------+--------------+ SFJ      Full                                                        +---------+---------------+---------+-----------+----------+--------------+ FV Prox  Full                                                         +---------+---------------+---------+-----------+----------+--------------+ FV Mid   Full                                                        +---------+---------------+---------+-----------+----------+--------------+  FV DistalFull                                                        +---------+---------------+---------+-----------+----------+--------------+ PFV      Full                                                        +---------+---------------+---------+-----------+----------+--------------+ POP      Full           Yes      Yes                                 +---------+---------------+---------+-----------+----------+--------------+ PTV      Full                                                        +---------+---------------+---------+-----------+----------+--------------+ PERO     Full                                                        +---------+---------------+---------+-----------+----------+--------------+     Summary: BILATERAL: - No evidence of deep vein thrombosis seen in the lower extremities, bilaterally.   *See table(s) above for measurements and observations. Electronically signed by Curt Jews MD on 06/30/2019 at 5:10:28 PM.    Final    ECHOCARDIOGRAM LIMITED  Result Date: 06/30/2019   ECHOCARDIOGRAM LIMITED REPORT   Patient Name:   JENAH VANASTEN Date of Exam: 06/30/2019 Medical Rec #:  [id number]        Height:       61.0 in Accession #:    [id number]       Weight:       204.0 lb Date of Birth:  04/22/1981        BSA:          1.90 m Patient Age:    43 years         BP:           134/97 mmHg Patient Gender: F                HR:           105 bpm. Exam Location:  Inpatient  Procedure: Limited Echo, Limited Color Doppler and Cardiac Doppler Indications:    pulmonary emboli  History:        Patient has no prior history of Echocardiogram examinations.  Sonographer:    Johny Chess Referring Phys: [id number] RAHUL P DESAI IMPRESSIONS  1. Left  ventricular ejection fraction, by visual estimation, is 50 to 55%. The left ventricle has normal function. There is no increased left ventricular wall thickness.  2. Global right ventricle has mildly reduced systolic function.The right ventricular size is mildly enlarged.  3. Left ventricular diastolic parameters are  indeterminate.  4. The mitral valve is normal in structure. Trivial mitral valve regurgitation. No evidence of mitral stenosis.  5. The tricuspid valve was normal in structure. Tricuspid valve regurgitation is trivial. FINDINGS  Left Ventricle: Left ventricular ejection fraction, by visual estimation, is 50 to 55%. The left ventricle has normal function. There is no increased left ventricular wall thickness. Left ventricular diastolic parameters are indeterminate. Right Ventricle: The right ventricular size is mildly enlarged. Right vetricular wall thickness was not assessed. Global RV systolic function is has mildly reduced systolic function. Left Atrium: Left atrial size was normal in size. Right Atrium: Right atrial size was normal in size. Right atrial pressure is estimated at 3 mmHg. Pericardium: There is no evidence of pericardial effusion is seen. There is no evidence of pericardial effusion. Mitral Valve: The mitral valve is normal in structure. No evidence of mitral valve stenosis by observation. Trivial mitral valve regurgitation. Tricuspid Valve: The tricuspid valve is normal in structure. Tricuspid valve regurgitation is trivial. Aortic Valve: The aortic valve is normal in structure. The aortic valve is structurally normal, with no evidence of sclerosis or stenosis. Pulmonic Valve: The pulmonic valve was normal in structure. Pulmonic valve regurgitation is not visualized by color flow Doppler. Pulmonic regurgitation is not visualized by color flow Doppler. Aorta: The aortic root, ascending aorta and aortic arch are all structurally normal, with no evidence of dilitation or obstruction.  Venous: The inferior vena cava was not well visualized. Shunts: The interatrial septum was not well visualized.  LEFT VENTRICLE          Normals PLAX 2D LVIDd:         4.60 cm  3.6 cm   Diastology                Normals LVIDs:         3.20 cm  1.7 cm   LV e' lateral: 7.72 cm/s  6.42 cm/s LV PW:         1.00 cm  1.4 cm   LV e' medial:  11.60 cm/s 6.96 cm/s LV IVS:        0.80 cm  1.3 cm LVOT diam:     1.80 cm  2.0 cm LV SV:         56 ml    79 ml LV SV Index:   27.57    45 ml/m2 LVOT Area:     2.54 cm 3.14 cm2  LEFT ATRIUM         Index LA diam:    3.10 cm 1.63 cm/m  AORTIC VALVE             Normals LVOT Vmax:   93.80 cm/s LVOT Vmean:  56.200 cm/s 75 cm/s LVOT VTI:    0.148 m     25.3 cm  AORTA                 Normals Ao Root diam: 2.90 cm 31 mm  SHUNTS Systemic VTI:  0.15 m Systemic Diam: 1.80 cm  Cherlynn Kaiser MD Electronically signed by Cherlynn Kaiser MD Signature Date/Time: 06/30/2019/5:31:43 PMThe mitral valve is normal in structure.    Final

## 2019-07-03 ENCOUNTER — Encounter (INDEPENDENT_AMBULATORY_CARE_PROVIDER_SITE_OTHER): Payer: Self-pay

## 2019-07-03 LAB — GLUCOSE, CAPILLARY
Glucose-Capillary: 80 mg/dL (ref 70–99)
Glucose-Capillary: 123 mg/dL — ABNORMAL HIGH (ref 70–99)

## 2019-07-03 MED ORDER — MAGNESIUM HYDROXIDE 400 MG/5ML PO SUSP
30.0000 mL | Freq: Two times a day (BID) | ORAL | Status: DC
  Administered 2019-07-03: 30 mL via ORAL
  Filled 2019-07-03: qty 30

## 2019-07-03 MED ORDER — ONDANSETRON HCL 4 MG PO TABS: 4.0000 mg | ORAL_TABLET | Freq: Three times a day (TID) | ORAL | 0 refills | Status: DC | PRN

## 2019-07-03 MED ORDER — IPRATROPIUM-ALBUTEROL 20-100 MCG/ACT IN AERS
1.0000 | INHALATION_SPRAY | Freq: Four times a day (QID) | RESPIRATORY_TRACT | Status: DC | PRN
  Filled 2019-07-03: qty 4

## 2019-07-03 MED ORDER — BISACODYL 5 MG PO TBEC
10.0000 mg | DELAYED_RELEASE_TABLET | Freq: Every day | ORAL | Status: DC
  Administered 2019-07-03: 10 mg via ORAL
  Filled 2019-07-03: qty 2

## 2019-07-03 MED ORDER — DEXAMETHASONE 0.5 MG PO TABS
1.0000 mg | ORAL_TABLET | Freq: Every day | ORAL | Status: DC
  Administered 2019-07-03: 1 mg via ORAL
  Filled 2019-07-03: qty 2

## 2019-07-03 MED ORDER — APIXABAN 5 MG PO TABS: 5.0000 mg | ORAL_TABLET | Freq: Two times a day (BID) | ORAL | 0 refills | Status: DC

## 2019-07-03 MED ORDER — APIXABAN 5 MG PO TABS: 10.0000 mg | ORAL_TABLET | Freq: Two times a day (BID) | ORAL | 0 refills | Status: DC

## 2019-07-03 MED ORDER — GUAIFENESIN-DM 100-10 MG/5ML PO SYRP: 5.0000 mL | ORAL_SOLUTION | Freq: Four times a day (QID) | ORAL | 0 refills | Status: DC | PRN

## 2019-07-03 MED ORDER — POLYETHYLENE GLYCOL 3350 17 G PO PACK
17.0000 g | PACK | Freq: Two times a day (BID) | ORAL | Status: DC
  Administered 2019-07-03: 17 g via ORAL
  Filled 2019-07-03: qty 1

## 2019-07-03 NOTE — Progress Notes (Signed)
Inpatient Diabetes Program Recommendations  AACE/ADA: New Consensus Statement on Inpatient Glycemic Control (2015)  Target Ranges:  Prepandial:   less than 140 mg/dL      Peak postprandial:   less than 180 mg/dL (1-2 hours)      Critically ill patients:  140 - 180 mg/dL   Lab Results  Component Value Date   GLUCAP 80 07/03/2019   HGBA1C 11.7 (H) 06/30/2019    Noted STAT consult for insulin and DM teaching from Dr. Thedore Mins. Pt was spoken to and educated on 2/4. See note for further details.   Thanks,  Christena Deem RN, MSN, BC-ADM Inpatient Diabetes Coordinator Team Pager [phone] (8a-5p)

## 2019-07-03 NOTE — Progress Notes (Signed)
Patient given discharge instructions and teaching. No new questions or concerns. IV and tele removed. Friend to transport home.

## 2019-07-03 NOTE — Discharge Summary (Signed)
Shelly Shields OZH:[id number] DOB: December 28, 1980 DOA: 06/29/2019  PCP: [name], MD  Admit date: 06/29/2019  Discharge date: 07/03/2019  Admitted From: Home   Disposition:  Home   Recommendations for Outpatient Follow-up:   Follow up with PCP in 1-2 weeks  PCP Please obtain BMP/CBC, 2 view CXR in 1week,  (see Discharge instructions)   PCP Please follow up on the following pending results: Monitor CBGs, right gluteal cellulitis   Home Health: None Equipment/Devices: None  Consultations: None  Discharge Condition: Stable    CODE STATUS: Full    Diet Recommendation: Heart Healthy Low Carb    Chief Complaint  Patient presents with  . COVID  . Shortness of Breath  . Cough     Brief history of present illness from the day of admission and additional interim summary    39 year old female, obese, with past medical history significant for OSA, diabetes mellitus, hypertension and migraine. Apparently, patient was diagnosed with Covid on June 11, 2019. Patient has been managed symptomatically at home, with reports of fever, viral syndrome shortness of breath, her work-up in the ER was suggestive of COVID-19 pneumonia along with bilateral PE.  She also had been undergoing treatment for right gluteal infection for the last several days and was on doxycycline.  She was admitted to the hospital for further care.                                                                 Hospital Course   1. Acute Hypoxic Resp. Failure due to Acute Covid 19 Viral Pneumonitis during the ongoing 2020 Covid 19 Pandemic - she seems to have mild Covid disease and pneumonia, currently stable on room air,  is off of her steroids and she will finish her IV remdesivir course on 07/04/2019 in the outpatient setting, completely symptom-free  and in no distress.    SpO2: 94 %  Recent Labs  Lab 06/30/19 0200 07/01/19 0420 07/02/19 0500  CRP 17.5* 12.0* 5.5*  DDIMER 5.54* 5.11* 6.09*  FERRITIN 413*  --   --   BNP  --  66.4 51.1  PROCALCITON 0.15  --   --     Hepatic Function Latest Ref Rng & Units 07/02/2019 07/01/2019 06/30/2019  Total Protein 6.5 - 8.1 g/dL 6.2(L) 6.4(L) 7.6  Albumin 3.5 - 5.0 g/dL 2.1(L) 2.1(L) 2.4(L)  AST 15 - 41 U/L _0 ALT 0 - 44 U/L _1 Alk Phosphatase 38 - 126 U/L 77 80 99  Total Bilirubin 0.3 - 1.2 mg/dL 0.9 0.9 1.3(H)    2.  Bilateral PE.  Likely due to inflammation caused by COVID-19 infection, lack of activity and patient being on Depo shots. Currently on IV heparin drip, PCP may consider discontinuing Depo shots post  discharge.  Lower extremity venous duplex and echocardiogram are unremarkable.  3.  Obesity OSA.  Follow with PCP.  4.  Right gluteal skin infection.  No abscess on CT scan.  Continue doxycycline and follow with PCP within a week.   5. DM type II.  Placed on Lantus twice daily at home dose along with premeal NovoLog and high-dose sliding scale.  Will monitor and adjust.  A1c suggests extremely poor outpatient glycemic control due to hyperglycemia.  Provided with diabetic and insulin education.  Lab Results  Component Value Date   HGBA1C 11.7 (H) 06/30/2019    Discharge diagnosis     Active Problems:   Acute pulmonary embolism [facility])    Discharge instructions    Discharge Instructions    Discharge instructions   Complete by: As directed    Follow with Primary MD [name], MD in 7 days   Get CBC, CMP, 2 view Chest X ray -  checked next visit within 1 week by Primary MD    Activity: As tolerated with Full fall precautions use walker/cane & assistance as needed  Disposition Home   Diet: Heart Healthy  Low Carb  Accuchecks 4 times/day, Once in AM empty stomach and then before each meal. Log in all results and show them to your Prim.MD in 3  days. If any glucose reading is under 80 or above 300 call your Prim MD immidiately. Follow Low glucose instructions for glucose under 80 as instructed.  Special Instructions: If you have smoked or chewed Tobacco  in the last 2 yrs please stop smoking, stop any regular Alcohol  and or any Recreational drug use.  On your next visit with your primary care physician please Get Medicines reviewed and adjusted.  Please request your Prim.MD to go over all Hospital Tests and Procedure/Radiological results at the follow up, please get all Hospital records sent to your Prim MD by signing hospital release before you go home.  If you experience worsening of your admission symptoms, develop shortness of breath, life threatening emergency, suicidal or homicidal thoughts you must seek medical attention immediately by calling 911 or calling your MD immediately  if symptoms less severe.  You Must read complete instructions/literature along with all the possible adverse reactions/side effects for all the Medicines you take and that have been prescribed to you. Take any new Medicines after you have completely understood and accpet all the possible adverse reactions/side effects.   Increase activity slowly   Complete by: As directed    MyChart COVID-19 home monitoring program   Complete by: Jul 03, 2019    Is the patient willing to use the [place] for home monitoring?: Yes   Temperature monitoring   Complete by: Jul 03, 2019    After how many days would you like to receive a notification of this patient's flowsheet entries?: 1      Discharge Medications   Allergies as of 07/03/2019      Reactions   Ibuprofen    Ketorolac       Medication List    STOP taking these medications   promethazine 25 MG tablet Commonly known as: PHENERGAN     TAKE these medications   Accu-Chek Nano SmartView w/Device Kit 1 Device by Does not apply route 4 (four) times daily - after meals and at bedtime.     accu-chek softclix lancets Use as instructed for blood glucose checks four times daily, before meals and at bedtime   apixaban  5 MG Tabs tablet Commonly known as: ELIQUIS Take 2 tablets (10 mg total) by mouth 2 (two) times daily for 5 days.   apixaban 5 MG Tabs tablet Commonly known as: ELIQUIS Take 1 tablet (5 mg total) by mouth 2 (two) times daily. Start taking on: July 09, 2019   Cyanocobalamin 1000 MCG Lozg Take 2,000 mcg by mouth at bedtime.   doxycycline 100 MG capsule Commonly known as: VIBRAMYCIN Take 1 capsule (100 mg total) by mouth 2 (two) times daily.   fexofenadine 180 MG tablet Commonly known as: ALLEGRA Take 180 mg by mouth daily as needed.   fluticasone 110 MCG/ACT inhaler Commonly known as: FLOVENT HFA Inhale into the lungs 2 (two) times daily.   fluticasone 50 MCG/ACT nasal spray Commonly known as: FLONASE Place 2 sprays into both nostrils daily as needed for allergies.   glucose blood test strip Commonly known as: Accu-Chek SmartView Check sugar 6 x daily What changed: additional instructions   guaiFENesin-dextromethorphan 100-10 MG/5ML syrup Commonly known as: ROBITUSSIN DM Take 5 mLs by mouth every 6 (six) hours as needed for cough.   Insulin Glargine 100 UNIT/ML Solostar Pen Commonly known as: LANTUS Inject 35 Units into the skin daily. What changed:   how much to take  when to take this   insulin lispro protamine-lispro (75-25) 100 UNIT/ML Susp injection Commonly known as: HUMALOG 75/25 MIX Inject 50 Units into the skin 2 (two) times daily with a meal.   Insulin Pen Needle 32G X 4 MM Misc Commonly known as: Insupen Pen Needles BD Pen Needles- brand specific. Inject insulin via insulin pen daily   omeprazole 40 MG capsule Commonly known as: PRILOSEC Take 40 mg by mouth daily.   ondansetron 4 MG tablet Commonly known as: Zofran Take 1 tablet (4 mg total) by mouth every 8 (eight) hours as needed for nausea or vomiting.    rosuvastatin 10 MG tablet Commonly known as: CRESTOR Take 10 mg by mouth 2 (two) times a week.   SUMAtriptan 100 MG tablet Commonly known as: IMITREX Take 1 tablet (100 mg total) by mouth every 2 (two) hours as needed for migraine or headache.   topiramate 100 MG tablet Commonly known as: TOPAMAX Take 200 mg by mouth at bedtime.   TRUEplus Lancets 33G Misc USE TO CHECK BLOOD GLUCOSE QID - AFTER MEALS AND AT BEDTIME   valsartan 160 MG tablet Commonly known as: DIOVAN Take 160 mg by mouth daily.   Ventolin HFA 108 (90 Base) MCG/ACT inhaler Generic drug: albuterol Use as directed 2 puffs in the mouth or throat 4 (four) times daily as needed.         Major procedures and Radiology Reports - PLEASE review detailed and final reports thoroughly  -       CT Angio Chest PE W and/or Wo Contrast  Result Date: 06/29/2019 CLINICAL DATA:  Shortness of breath EXAM: CT ANGIOGRAPHY CHEST WITH CONTRAST TECHNIQUE: Multidetector CT imaging of the chest was performed using the standard protocol during bolus administration of intravenous contrast. Multiplanar CT image reconstructions and MIPs were obtained to evaluate the vascular anatomy. CONTRAST:  79m OMNIPAQUE IOHEXOL 350 MG/ML SOLN COMPARISON:  None. FINDINGS: Cardiovascular: Contrast injection is sufficient to demonstrate satisfactory opacification of the pulmonary arteries to the segmental level.There are extensive acute bilateral pulmonary emboli involving the main left pulmonary artery extending into the lobar, segmental, and subsegmental branches bilaterally. There are lobar, segmental, and subsegmental pulmonary emboli involving the right lower lobe. Segmental and  subsegmental pulmonary emboli are noted involving the right upper lobe. There is borderline right-sided heart strain with an RV LV ratio measuring approximately 0.9. There is however mild reflux of contrast in the IVC consistent with underlying cardiac dysfunction. Heart size is  enlarged. Mediastinum/Nodes: --No mediastinal or hilar lymphadenopathy. --No axillary lymphadenopathy. --No supraclavicular lymphadenopathy. --Normal thyroid gland. --The esophagus is unremarkable Lungs/Pleura: Patchy bilateral ground-glass airspace opacities are noted bilaterally. There is no pneumothorax. No large pleural effusion. The trachea is unremarkable. Upper Abdomen: No acute abnormality. Musculoskeletal: No chest wall abnormality. No acute or significant osseous findings. Review of the MIP images confirms the above findings. IMPRESSION: 1. Extensive bilateral acute pulmonary emboli as detailed above. The RV/LV ratio is borderline measuring 0.9, however there is reflux of contrast in the IVC consistent with right-sided heart strain. 2. Diffuse bilateral ground-glass airspace opacities concerning for an atypical infectious process such as viral pneumonia. These results were called by telephone at the time of interpretation on 06/29/2019 at 11:22 pm to provider [facility] , who verbally acknowledged these results. Electronically Signed   By: Constance Holster M.D.   On: 06/29/2019 23:26   CT PELVIS W CONTRAST  Result Date: 06/30/2019 CLINICAL DATA:  Right gluteal inflammation on prior CT, abscess EXAM: CT PELVIS WITH CONTRAST TECHNIQUE: Multidetector CT imaging of the pelvis was performed using the standard protocol following the bolus administration of intravenous contrast. CONTRAST:  175m OMNIPAQUE IOHEXOL 300 MG/ML  SOLN COMPARISON:  06/22/2019 FINDINGS: Urinary Tract: There is excreted contrast filling the urinary bladder without filling defect. Distal ureters are unremarkable. Bowel:  No bowel obstruction or ileus. Vascular/Lymphatic: No pathologically enlarged lymph nodes. No significant vascular abnormality seen. Reproductive:  No mass or other significant abnormality Other: Inflammatory changes seen within the medial inferior aspect of the right gluteal fold are again identified. There is no  fluid collection or formed abscess. Musculoskeletal: No acute or destructive bony lesions. Reconstructed images demonstrate no additional findings. IMPRESSION: 1. Inflammatory changes within the medial inferior aspect of the right gluteal fold consistent with cellulitis. No fluid collection or formed abscess. Electronically Signed   By: MRanda NgoM.D.   On: 06/30/2019 11:13   CT ABDOMEN PELVIS W CONTRAST  Result Date: 06/22/2019 CLINICAL DATA:  Abscess to right buttock with drainage. EXAM: CT ABDOMEN AND PELVIS WITH CONTRAST TECHNIQUE: Multidetector CT imaging of the abdomen and pelvis was performed using the standard protocol following bolus administration of intravenous contrast. CONTRAST:  1049mOMNIPAQUE IOHEXOL 300 MG/ML  SOLN COMPARISON:  January 12, 2016 FINDINGS: Lower chest: Mild to moderate severity patchy infiltrates are seen throughout both lung bases. Hepatobiliary: No focal liver abnormality is seen. No gallstones, gallbladder wall thickening, or biliary dilatation. Pancreas: Unremarkable. No pancreatic ductal dilatation or surrounding inflammatory changes. Spleen: Normal in size without focal abnormality. Adrenals/Urinary Tract: Adrenal glands are unremarkable. Kidneys are normal, without renal calculi, focal lesion, or hydronephrosis. Bladder is unremarkable. Stomach/Bowel: Stomach is within normal limits. Appendix appears normal. No evidence of bowel wall thickening, distention, or inflammatory changes. Vascular/Lymphatic: Reproductive: Uterus and bilateral adnexa are unremarkable. Other: No abdominal wall hernia or abnormality. No abdominopelvic ascites. Musculoskeletal: Mild diffuse inflammatory fat stranding is seen along the gluteal region on the right. There is no evidence of associated fluid collection or abscess. No acute or significant osseous findings. IMPRESSION: 1. Mild inflammation involving the gluteal region on the right, without visualization of an associated fluid collection  or abscess. 2. Mild to moderate severity patchy bibasilar infiltrates. Electronically Signed  By: Virgina Norfolk M.D.   On: 06/22/2019 22:40   VAS [place] LOWER EXTREMITY VENOUS (DVT)  Result Date: 06/30/2019  Lower Venous DVTStudy Indications: Pulmonary embolism.  Comparison Study: no prior Performing Technologist: Abram Sander RVS  Examination Guidelines: A complete evaluation includes B-mode imaging, spectral Doppler, color Doppler, and power Doppler as needed of all accessible portions of each vessel. Bilateral testing is considered an integral part of a complete examination. Limited examinations for reoccurring indications may be performed as noted. The reflux portion of the exam is performed with the patient in reverse Trendelenburg.  +---------+---------------+---------+-----------+----------+--------------+ RIGHT    CompressibilityPhasicitySpontaneityPropertiesThrombus Aging +---------+---------------+---------+-----------+----------+--------------+ CFV      Full           Yes      Yes                                 +---------+---------------+---------+-----------+----------+--------------+ SFJ      Full                                                        +---------+---------------+---------+-----------+----------+--------------+ FV Prox  Full                                                        +---------+---------------+---------+-----------+----------+--------------+ FV Mid   Full                                                        +---------+---------------+---------+-----------+----------+--------------+ FV DistalFull                                                        +---------+---------------+---------+-----------+----------+--------------+ PFV      Full                                                        +---------+---------------+---------+-----------+----------+--------------+ POP      Full           Yes      Yes                                  +---------+---------------+---------+-----------+----------+--------------+ PTV      Full                                                        +---------+---------------+---------+-----------+----------+--------------+ PERO     Full                                                        +---------+---------------+---------+-----------+----------+--------------+   +---------+---------------+---------+-----------+----------+--------------+  LEFT     CompressibilityPhasicitySpontaneityPropertiesThrombus Aging +---------+---------------+---------+-----------+----------+--------------+ CFV      Full           Yes      Yes                                 +---------+---------------+---------+-----------+----------+--------------+ SFJ      Full                                                        +---------+---------------+---------+-----------+----------+--------------+ FV Prox  Full                                                        +---------+---------------+---------+-----------+----------+--------------+ FV Mid   Full                                                        +---------+---------------+---------+-----------+----------+--------------+ FV DistalFull                                                        +---------+---------------+---------+-----------+----------+--------------+ PFV      Full                                                        +---------+---------------+---------+-----------+----------+--------------+ POP      Full           Yes      Yes                                 +---------+---------------+---------+-----------+----------+--------------+ PTV      Full                                                        +---------+---------------+---------+-----------+----------+--------------+ PERO     Full                                                         +---------+---------------+---------+-----------+----------+--------------+     Summary: BILATERAL: - No evidence of deep vein thrombosis seen in the lower extremities, bilaterally.   *See table(s) above for measurements and observations. Electronically signed by Curt Jews MD on 06/30/2019 at 5:10:28 PM.    Final    ECHOCARDIOGRAM  LIMITED  Result Date: 06/30/2019   ECHOCARDIOGRAM LIMITED REPORT   Patient Name:   Shelly Shields Date of Exam: 06/30/2019 Medical Rec #:  [id number]        Height:       61.0 in Accession #:    [id number]       Weight:       204.0 lb Date of Birth:  1980-06-12        BSA:          1.90 m Patient Age:    39 years         BP:           134/97 mmHg Patient Gender: F                HR:           105 bpm. Exam Location:  Inpatient  Procedure: Limited Echo, Limited Color Doppler and Cardiac Doppler Indications:    pulmonary emboli  History:        Patient has no prior history of Echocardiogram examinations.  Sonographer:    Johny Chess Referring Phys: [id number] RAHUL P DESAI IMPRESSIONS  1. Left ventricular ejection fraction, by visual estimation, is 50 to 55%. The left ventricle has normal function. There is no increased left ventricular wall thickness.  2. Global right ventricle has mildly reduced systolic function.The right ventricular size is mildly enlarged.  3. Left ventricular diastolic parameters are indeterminate.  4. The mitral valve is normal in structure. Trivial mitral valve regurgitation. No evidence of mitral stenosis.  5. The tricuspid valve was normal in structure. Tricuspid valve regurgitation is trivial. FINDINGS  Left Ventricle: Left ventricular ejection fraction, by visual estimation, is 50 to 55%. The left ventricle has normal function. There is no increased left ventricular wall thickness. Left ventricular diastolic parameters are indeterminate. Right Ventricle: The right ventricular size is mildly enlarged. Right vetricular wall thickness was not assessed. Global RV  systolic function is has mildly reduced systolic function. Left Atrium: Left atrial size was normal in size. Right Atrium: Right atrial size was normal in size. Right atrial pressure is estimated at 3 mmHg. Pericardium: There is no evidence of pericardial effusion is seen. There is no evidence of pericardial effusion. Mitral Valve: The mitral valve is normal in structure. No evidence of mitral valve stenosis by observation. Trivial mitral valve regurgitation. Tricuspid Valve: The tricuspid valve is normal in structure. Tricuspid valve regurgitation is trivial. Aortic Valve: The aortic valve is normal in structure. The aortic valve is structurally normal, with no evidence of sclerosis or stenosis. Pulmonic Valve: The pulmonic valve was normal in structure. Pulmonic valve regurgitation is not visualized by color flow Doppler. Pulmonic regurgitation is not visualized by color flow Doppler. Aorta: The aortic root, ascending aorta and aortic arch are all structurally normal, with no evidence of dilitation or obstruction. Venous: The inferior vena cava was not well visualized. Shunts: The interatrial septum was not well visualized.  LEFT VENTRICLE          Normals PLAX 2D LVIDd:         4.60 cm  3.6 cm   Diastology                Normals LVIDs:         3.20 cm  1.7 cm   LV e' lateral: 7.72 cm/s  6.42 cm/s LV PW:         1.00 cm  1.4 cm   LV e'  medial:  11.60 cm/s 6.96 cm/s LV IVS:        0.80 cm  1.3 cm LVOT diam:     1.80 cm  2.0 cm LV SV:         56 ml    79 ml LV SV Index:   27.57    45 ml/m2 LVOT Area:     2.54 cm 3.14 cm2  LEFT ATRIUM         Index LA diam:    3.10 cm 1.63 cm/m  AORTIC VALVE             Normals LVOT Vmax:   93.80 cm/s LVOT Vmean:  56.200 cm/s 75 cm/s LVOT VTI:    0.148 m     25.3 cm  AORTA                 Normals Ao Root diam: 2.90 cm 31 mm  SHUNTS Systemic VTI:  0.15 m Systemic Diam: 1.80 cm  Cherlynn Kaiser MD Electronically signed by Cherlynn Kaiser MD Signature Date/Time: 06/30/2019/5:31:43 PMThe  mitral valve is normal in structure.    Final     Micro Results     Recent Results (from the past 240 hour(s))  MRSA PCR Screening     Status: None   Collection Time: 06/30/19  1:54 PM   Specimen: Nasal Mucosa; Nasopharyngeal  Result Value Ref Range Status   MRSA by PCR NEGATIVE NEGATIVE Final    Comment: Performed at [facility], [place] [address], [place], [place] [address]    Today   Subjective    Shelly Shields today has no headache,no chest abdominal pain,no new weakness tingling or numbness, feels much better wants to go home today.     Objective   Blood pressure 140/82, pulse 88, temperature 98.6 F (37 C), temperature source Oral, resp. rate 18, height 5' 1" (1.549 m), SpO2 94 %.   Intake/Output Summary (Last 24 hours) at 07/03/2019 1033 Last data filed at 07/02/2019 1500 Gross per 24 hour  Intake 180 ml  Output --  Net 180 ml    Exam Awake Alert,   No new F.N deficits, Normal affect [place].AT,PERRAL Supple Neck,No JVD, No cervical lymphadenopathy appriciated.  Symmetrical Chest wall movement, Good air movement bilaterally, CTAB RRR,No Gallops,Rubs or new Murmurs, No Parasternal Heave +ve B.Sounds, Abd Soft, Non tender, No organomegaly appriciated, No rebound -guarding or rigidity. No Cyanosis, Clubbing or edema,    Data Review   CBC w Diff:  Lab Results  Component Value Date   WBC 16.3 (H) 07/01/2019   HGB 11.3 (L) 07/01/2019   HCT 34.9 (L) 07/01/2019   PLT 390 07/01/2019   LYMPHOPCT 22 07/01/2019   MONOPCT 7 07/01/2019   EOSPCT 0 07/01/2019   BASOPCT 0 07/01/2019    CMP:  Lab Results  Component Value Date   NA 138 07/02/2019   K 3.5 07/02/2019   CL 108 07/02/2019   CO2 20 (L) 07/02/2019   BUN 12 07/02/2019   CREATININE 0.75 07/02/2019   PROT 6.2 (L) 07/02/2019   ALBUMIN 2.1 (L) 07/02/2019   BILITOT 0.9 07/02/2019   ALKPHOS 77 07/02/2019   AST 16 07/02/2019   ALT 21 07/02/2019  .   Total Time in preparing paper work, data evaluation  and todays exam - 28 minutes  Lala Lund M.D on 07/03/2019 at 10:33 AM  [facility]   Office  [phone]

## 2019-07-04 ENCOUNTER — Encounter (HOSPITAL_COMMUNITY): Payer: Self-pay

## 2019-07-04 ENCOUNTER — Ambulatory Visit (HOSPITAL_COMMUNITY)
Admission: RE | Admit: 2019-07-04 | Discharge: 2019-07-04 | Disposition: A | Discharge status: Home / Self Care | Payer: 59 | Source: Ambulatory Visit | Attending: Pulmonary Disease | Admitting: Pulmonary Disease

## 2019-07-04 ENCOUNTER — Encounter (INDEPENDENT_AMBULATORY_CARE_PROVIDER_SITE_OTHER): Payer: Self-pay

## 2019-07-04 VITALS — BP 157/96 | HR 89 | Temp 99.0°F | Resp 18

## 2019-07-04 DIAGNOSIS — U071 COVID-19: Secondary | ICD-10-CM | POA: Diagnosis not present

## 2019-07-04 MED ORDER — METHYLPREDNISOLONE SODIUM SUCC 125 MG IJ SOLR: 125.0000 mg | Freq: Once | INTRAMUSCULAR | Status: DC | PRN

## 2019-07-04 MED ORDER — SODIUM CHLORIDE 0.9 % IV SOLN
100.0000 mg | Freq: Once | INTRAVENOUS | Status: AC
  Administered 2019-07-04: 10:00:00 100 mg via INTRAVENOUS
  Filled 2019-07-04: qty 20

## 2019-07-04 MED ORDER — FAMOTIDINE IN NACL 20-0.9 MG/50ML-% IV SOLN: 20.0000 mg | Freq: Once | INTRAVENOUS | Status: DC | PRN

## 2019-07-04 MED ORDER — EPINEPHRINE 0.3 MG/0.3ML IJ SOAJ: 0.3000 mg | Freq: Once | INTRAMUSCULAR | Status: DC | PRN

## 2019-07-04 MED ORDER — ALBUTEROL SULFATE HFA 108 (90 BASE) MCG/ACT IN AERS: 2.0000 | INHALATION_SPRAY | Freq: Once | RESPIRATORY_TRACT | Status: DC | PRN

## 2019-07-04 MED ORDER — DIPHENHYDRAMINE HCL 50 MG/ML IJ SOLN: 50.0000 mg | Freq: Once | INTRAMUSCULAR | Status: DC | PRN

## 2019-07-04 MED ORDER — SODIUM CHLORIDE 0.9 % IV SOLN: INTRAVENOUS | Status: DC | PRN

## 2019-07-04 NOTE — Discharge Instructions (Signed)
10 Things You Can Do to Manage Your COVID-19 Symptoms at Home If you have possible or confirmed COVID-19: 1. Stay home from work and school. And stay away from other public places. If you must go out, avoid using any kind of public transportation, ridesharing, or taxis. 2. Monitor your symptoms carefully. If your symptoms get worse, call your healthcare provider immediately. 3. Get rest and stay hydrated. 4. If you have a medical appointment, call the healthcare provider ahead of time and tell them that you have or may have COVID-19. 5. For medical emergencies, call 911 and notify the dispatch personnel that you have or may have COVID-19. 6. Cover your cough and sneezes with a tissue or use the inside of your elbow. 7. Wash your hands often with soap and water for at least 20 seconds or clean your hands with an alcohol-based hand sanitizer that contains at least 60% alcohol. 8. As much as possible, stay in a specific room and away from other people in your home. Also, you should use a separate bathroom, if available. If you need to be around other people in or outside of the home, wear a mask. 9. Avoid sharing personal items with other people in your household, like dishes, towels, and bedding. 10. Clean all surfaces that are touched often, like counters, tabletops, and doorknobs. Use household cleaning sprays or wipes according to the label instructions. cdc.gov/coronavirus 11/25/2018 This information is not intended to replace advice given to you by your health care provider. Make sure you discuss any questions you have with your health care provider. Document Revised: 04/29/2019 Document Reviewed: 04/29/2019 Elsevier Patient Education  2020 Elsevier Inc.  

## 2019-07-06 ENCOUNTER — Encounter (INDEPENDENT_AMBULATORY_CARE_PROVIDER_SITE_OTHER): Payer: Self-pay

## 2019-12-15 ENCOUNTER — Ambulatory Visit
Admission: EM | Admit: 2019-12-15 | Discharge: 2019-12-15 | Disposition: A | Discharge status: Home / Self Care | Payer: 59 | Attending: Family Medicine | Admitting: Family Medicine

## 2019-12-15 ENCOUNTER — Ambulatory Visit (INDEPENDENT_AMBULATORY_CARE_PROVIDER_SITE_OTHER): Payer: 59

## 2019-12-15 DIAGNOSIS — R05 Cough: Secondary | ICD-10-CM

## 2019-12-15 DIAGNOSIS — R000 Tachycardia, unspecified: Secondary | ICD-10-CM

## 2019-12-15 DIAGNOSIS — J209 Acute bronchitis, unspecified: Secondary | ICD-10-CM

## 2019-12-15 DIAGNOSIS — R079 Chest pain, unspecified: Secondary | ICD-10-CM | POA: Diagnosis not present

## 2019-12-15 DIAGNOSIS — R059 Cough, unspecified: Secondary | ICD-10-CM

## 2019-12-15 MED ORDER — HYDROCODONE-HOMATROPINE 5-1.5 MG/5ML PO SYRP: 5.0000 mL | ORAL_SOLUTION | Freq: Four times a day (QID) | ORAL | 0 refills | Status: DC | PRN

## 2019-12-15 NOTE — Discharge Instructions (Addendum)
I have sent in Hycodan cough syrup for you to your pharmacy  Your chest xray showed a minimal infiltrate vs atelectasis on the left lung  This will need to be rechecked in about a month  Follow up with pulmonology

## 2019-12-15 NOTE — ED Triage Notes (Signed)
Pt c/o cough since she had covid in 01/21. States cough got worse on Saturday, neg. covid test on Sunday. States her pulmonology sent in prednisone but wouldn't give her any cough medicines.

## 2019-12-15 NOTE — ED Provider Notes (Signed)
[facility]   [id number] 12/15/19 Arrival Time: 1852   CC: COVID symptoms  SUBJECTIVE: History from: patient.  Shelly Shields is a 39 y.o. female who presents with abrupt onset of nasal congestion, PND, and persistent dry cough for the last week. She reports that she had Covid in Jan-Feb 2021 and was hospitalized. Reports that she sees pulmonology. Denies recent travel. Is currently taking prednisone from pulmonology. There are no aggravating symptoms. Reports previous symptoms in the past. Reports that she doesn't think that she has had a chest xray this year. Denies fever, chills, fatigue, sinus pain, rhinorrhea, sore throat, SOB, wheezing, chest pain, nausea, changes in bowel or bladder habits.    ROS: As per HPI.  All other pertinent ROS negative.     Past Medical History:  Diagnosis Date  . Asthma   . Diabetes mellitus without complication ([place])   . Hypertension   . Migraines   . Obesity   . Sleep apnea    Past Surgical History:  Procedure Laterality Date  . BILATERAL CARPAL TUNNEL RELEASE     Allergies  Allergen Reactions  . Ibuprofen   . Ketorolac    No current facility-administered medications on file prior to encounter.   Current Outpatient Medications on File Prior to Encounter  Medication Sig Dispense Refill  . apixaban (ELIQUIS) 5 MG TABS tablet Take 2 tablets (10 mg total) by mouth 2 (two) times daily for 5 days. 20 tablet 0  . apixaban (ELIQUIS) 5 MG TABS tablet Take 1 tablet (5 mg total) by mouth 2 (two) times daily. 60 tablet 0  . Blood Glucose Monitoring Suppl (ACCU-CHEK NANO SMARTVIEW) W/DEVICE KIT 1 Device by Does not apply route 4 (four) times daily - after meals and at bedtime. 1 kit 0  . Cyanocobalamin 1000 MCG LOZG Take 2,000 mcg by mouth at bedtime.     . fexofenadine (ALLEGRA) 180 MG tablet Take 180 mg by mouth daily as needed.     . fluticasone (FLONASE) 50 MCG/ACT nasal spray Place 2 sprays into both nostrils daily as needed for  allergies.   3  . glucose blood (ACCU-CHEK SMARTVIEW) test strip Check sugar 6 x daily (Patient taking differently: Check sugar 6 x daily. Uses true test instead) 200 each 3  . guaiFENesin-dextromethorphan (ROBITUSSIN DM) 100-10 MG/5ML syrup Take 5 mLs by mouth every 6 (six) hours as needed for cough. 118 mL 0  . Insulin Glargine (LANTUS) 100 UNIT/ML Solostar Pen Inject 35 Units into the skin daily. (Patient taking differently: Inject 80 Units into the skin 2 (two) times daily. ) 15 mL 11  . insulin lispro protamine-lispro (HUMALOG 75/25 MIX) (75-25) 100 UNIT/ML SUSP injection Inject 50 Units into the skin 2 (two) times daily with a meal.     . Insulin Pen Needle (INSUPEN PEN NEEDLES) 32G X 4 MM MISC BD Pen Needles- brand specific. Inject insulin via insulin pen daily 200 each 3  . Lancet Devices (ACCU-CHEK SOFTCLIX) lancets Use as instructed for blood glucose checks four times daily, before meals and at bedtime 100 each 5  . omeprazole (PRILOSEC) 40 MG capsule Take 40 mg by mouth daily.    . ondansetron (ZOFRAN) 4 MG tablet Take 1 tablet (4 mg total) by mouth every 8 (eight) hours as needed for nausea or vomiting. 20 tablet 0  . rosuvastatin (CRESTOR) 10 MG tablet Take 10 mg by mouth 2 (two) times a week.    . SUMAtriptan (IMITREX) 100 MG tablet Take 1  tablet (100 mg total) by mouth every 2 (two) hours as needed for migraine or headache. 10 tablet 1  . topiramate (TOPAMAX) 100 MG tablet Take 200 mg by mouth at bedtime.     . TRUEPLUS LANCETS 33G MISC USE TO CHECK BLOOD GLUCOSE QID - AFTER MEALS AND AT BEDTIME  3  . valsartan (DIOVAN) 160 MG tablet Take 160 mg by mouth daily.    . VENTOLIN HFA 108 (90 BASE) MCG/ACT inhaler Use as directed 2 puffs in the mouth or throat 4 (four) times daily as needed.  3   Social History   Socioeconomic History  . Marital status: Single    Spouse name: Not on file  . Number of children: Not on file  . Years of education: Not on file  . Highest education level:  Not on file  Occupational History  . Not on file  Tobacco Use  . Smoking status: Never Smoker  . Smokeless tobacco: Never Used  Vaping Use  . Vaping Use: Never used  Substance and Sexual Activity  . Alcohol use: Yes    Alcohol/week: 0.0 standard drinks    Comment: occasional   . Drug use: No  . Sexual activity: Not on file  Other Topics Concern  . Not on file  Social History Narrative   Lives in [place]: School and working   Social Determinants of Radio broadcast assistant Strain:   . Difficulty of Paying Living Expenses:   Food Insecurity:   . Worried About Charity fundraiser in the Last Year:   . Arboriculturist in the Last Year:   Transportation Needs:   . Film/video editor (Medical):   Marland Kitchen Lack of Transportation (Non-Medical):   Physical Activity:   . Days of Exercise per Week:   . Minutes of Exercise per Session:   Stress:   . Feeling of Stress :   Social Connections:   . Frequency of Communication with Friends and Family:   . Frequency of Social Gatherings with Friends and Family:   . Attends Religious Services:   . Active Member of Clubs or Organizations:   . Attends Archivist Meetings:   Marland Kitchen Marital Status:   Intimate Partner Violence:   . Fear of Current or Ex-Partner:   . Emotionally Abused:   Marland Kitchen Physically Abused:   . Sexually Abused:    Family History  Problem Relation Age of Onset  . Hypertension Mother   . Thyroid disease Mother   . Hypertension Father   . Stroke Father   . Diabetes Father   . Sleep apnea Father     OBJECTIVE:  Vitals:   12/15/19 1905 12/15/19 1913  BP: 110/79   Pulse: (!) 115   Resp:  18  Temp: 98 F (36.7 C)   TempSrc: Oral   SpO2: 96%      General appearance: alert; appears fatigued, but nontoxic; speaking in full sentences and tolerating own secretions HEENT: NCAT; Ears: EACs clear, TMs pearly gray; Eyes: PERRL.  EOM grossly intact. Sinuses: nontender; Nose: nares patent without  rhinorrhea, Throat: oropharynx clear, tonsils non erythematous or enlarged, uvula midline  Neck: supple without LAD Lungs: unlabored respirations, symmetrical air entry; cough: moderate; no respiratory distress; diminished lung sounds to bilateral lower lung bases Heart: regular rate and rhythm.  Radial pulses 2+ symmetrical bilaterally Skin: warm and dry Psychological: alert and cooperative; normal mood and affect  LABS:  No results found for this  or any previous visit (from the past 24 hour(s)).   ASSESSMENT & PLAN:  1. Cough   2. Tachycardia     Meds ordered this encounter  Medications  . HYDROcodone-homatropine (HYCODAN) 5-1.5 MG/5ML syrup    Sig: Take 5 mLs by mouth every 6 (six) hours as needed for cough.    Dispense:  120 mL    Refill:  0    Order Specific Question:   Supervising Provider    Answer:   Chase Picket [[id number]]    CXR shows "Minimal opacity at the left CP angle, atelectasis versus minimal Infiltrate." Continue steroid regimen from pulmonology Prescribed Hycodan for cough Cough medication sedation precautions given Get plenty of rest and push fluids Use OTC zyrtec for nasal congestion, runny nose, and/or sore throat Use OTC flonase for nasal congestion and runny nose Use medications daily for symptom relief Use OTC medications like ibuprofen or tylenol as needed fever or pain Call or go to the ED if you have any new or worsening symptoms such as fever, worsening cough, shortness of breath, chest tightness, chest pain, turning blue, changes in mental status.  Reviewed expectations re: course of current medical issues. Questions answered. Outlined signs and symptoms indicating need for more acute intervention. Patient verbalized understanding. After Visit Summary given.         Faustino Congress, NP 12/17/19 1733

## 2020-02-06 ENCOUNTER — Other Ambulatory Visit: Payer: Self-pay

## 2020-02-06 ENCOUNTER — Emergency Department (HOSPITAL_COMMUNITY)
Admission: EM | Admit: 2020-02-06 | Discharge: 2020-02-07 | Disposition: A | Discharge status: Home / Self Care | Payer: 59 | Attending: Emergency Medicine | Admitting: Emergency Medicine

## 2020-02-06 ENCOUNTER — Encounter (HOSPITAL_COMMUNITY): Payer: Self-pay | Admitting: Emergency Medicine

## 2020-02-06 DIAGNOSIS — R109 Unspecified abdominal pain: Secondary | ICD-10-CM | POA: Diagnosis present

## 2020-02-06 DIAGNOSIS — Z5321 Procedure and treatment not carried out due to patient leaving prior to being seen by health care provider: Secondary | ICD-10-CM | POA: Insufficient documentation

## 2020-02-06 DIAGNOSIS — K5900 Constipation, unspecified: Secondary | ICD-10-CM | POA: Insufficient documentation

## 2020-02-06 LAB — COMPREHENSIVE METABOLIC PANEL
ALT: 27 U/L (ref 0–44)
AST: 18 U/L (ref 15–41)
Albumin: 3.7 g/dL (ref 3.5–5.0)
Alkaline Phosphatase: 85 U/L (ref 38–126)
Anion gap: 10 (ref 5–15)
BUN: 14 mg/dL (ref 6–20)
CO2: 21 mmol/L — ABNORMAL LOW (ref 22–32)
Calcium: 9 mg/dL (ref 8.9–10.3)
Chloride: 108 mmol/L (ref 98–111)
Creatinine, Ser: 1.13 mg/dL — ABNORMAL HIGH (ref 0.44–1.00)
GFR calc Af Amer: >60 mL/min (ref >60)
GFR calc non Af Amer: >60 mL/min (ref >60)
Glucose, Bld: 238 mg/dL — ABNORMAL HIGH (ref 70–99)
Potassium: 4 mmol/L (ref 3.5–5.1)
Sodium: 139 mmol/L (ref 135–145)
Total Bilirubin: 1.3 mg/dL — ABNORMAL HIGH (ref 0.3–1.2)
Total Protein: 7.3 g/dL (ref 6.5–8.1)

## 2020-02-06 LAB — CBC
HCT: 45.5 % (ref 36.0–46.0)
Hemoglobin: 14.5 g/dL (ref 12.0–15.0)
MCH: 28.7 pg (ref 26.0–34.0)
MCHC: 31.9 g/dL (ref 30.0–36.0)
MCV: 90.1 fL (ref 80.0–100.0)
Platelets: 308 10*3/uL (ref 150–400)
RBC: 5.05 MIL/uL (ref 3.87–5.11)
RDW: 14.6 % (ref 11.5–15.5)
WBC: 14.8 10*3/uL — ABNORMAL HIGH (ref 4.0–10.5)
nRBC: 0 % (ref 0.0–0.2)

## 2020-02-06 LAB — I-STAT BETA HCG BLOOD, ED (MC, WL, AP ONLY): I-stat hCG, quantitative: <5 m[IU]/mL (ref <5)

## 2020-02-06 LAB — LIPASE, BLOOD: Lipase: 34 U/L (ref 11–51)

## 2020-02-06 NOTE — ED Triage Notes (Signed)
Pt reports abdominal pain and constipation since last Thursday.  Pt has tried [employer]. [place], colace and an enema w/ no results.

## 2020-02-07 NOTE — ED Notes (Signed)
Pt stated that while waiting in the waiting room, she had a bowel movement and felt as if the medication she had taken prior to coming here was working. Stated she would return if symptoms resumed.

## 2020-02-19 ENCOUNTER — Encounter (HOSPITAL_COMMUNITY): Payer: Self-pay | Admitting: Emergency Medicine

## 2020-02-19 ENCOUNTER — Emergency Department (HOSPITAL_COMMUNITY)
Admission: EM | Admit: 2020-02-19 | Discharge: 2020-02-19 | Disposition: A | Discharge status: Home / Self Care | Payer: 59 | Attending: Emergency Medicine | Admitting: Emergency Medicine

## 2020-02-19 DIAGNOSIS — J45909 Unspecified asthma, uncomplicated: Secondary | ICD-10-CM | POA: Diagnosis not present

## 2020-02-19 DIAGNOSIS — E1110 Type 2 diabetes mellitus with ketoacidosis without coma: Secondary | ICD-10-CM | POA: Insufficient documentation

## 2020-02-19 DIAGNOSIS — I10 Essential (primary) hypertension: Secondary | ICD-10-CM | POA: Diagnosis not present

## 2020-02-19 DIAGNOSIS — G43909 Migraine, unspecified, not intractable, without status migrainosus: Secondary | ICD-10-CM | POA: Diagnosis present

## 2020-02-19 DIAGNOSIS — Z794 Long term (current) use of insulin: Secondary | ICD-10-CM | POA: Diagnosis not present

## 2020-02-19 DIAGNOSIS — Z79899 Other long term (current) drug therapy: Secondary | ICD-10-CM | POA: Diagnosis not present

## 2020-02-19 MED ORDER — SODIUM CHLORIDE 0.9 % IV BOLUS
1000.0000 mL | Freq: Once | INTRAVENOUS | Status: AC
  Administered 2020-02-19: 1000 mL via INTRAVENOUS

## 2020-02-19 MED ORDER — DIPHENHYDRAMINE HCL 50 MG/ML IJ SOLN
25.0000 mg | Freq: Once | INTRAMUSCULAR | Status: AC
  Administered 2020-02-19: 25 mg via INTRAVENOUS
  Filled 2020-02-19: qty 1

## 2020-02-19 MED ORDER — PROCHLORPERAZINE EDISYLATE 10 MG/2ML IJ SOLN
10.0000 mg | Freq: Once | INTRAMUSCULAR | Status: AC
  Administered 2020-02-19: 10 mg via INTRAVENOUS
  Filled 2020-02-19: qty 2

## 2020-02-19 NOTE — ED Triage Notes (Signed)
Pt. Stated, I had a migraine that started this morning.

## 2020-02-19 NOTE — ED Provider Notes (Signed)
[facility] EMERGENCY DEPARTMENT Provider Note   CSN: [id number] Arrival date & time: 02/19/20  1154     History Chief Complaint  Patient presents with  . Migraine    Shelly Shields is a 39 y.o. female.  HPI 39 year old female with a history of asthma, DM type II, hypertension, migraines, obesity, sleep apnea, seen in the ED in February with a PE in the setting of COVID-19, not currently anticoagulated presents to the ED with complaints of a migraine.  Patient states that she has history of migraines, which are normally more frontal in nature.  She stated that she developed a migraine that was more behind her eyes and her nasal bones.  She noted some lacrimation to the left eye and some blurry vision.  She now states that her vision has returned, but she still continues to have pain in the area of her nasal bridge.  She reports sensitivity to light.  She states that this is atypical for her migraines and thus she wanted to come to the ER to be evaluated.  She did not take her Topamax.  States she does have a history of strokes in her family.  Endorses some nausea but no vomiting.  Denies any slurred speech, facial droop, unilateral weakness.  No prior history of strokes.  She has not taken anything for her migraines.  Denies any neck stiffness, fevers or chills.    Past Medical History:  Diagnosis Date  . Asthma   . Diabetes mellitus without complication ([place])   . Hypertension   . Migraines   . Obesity   . Sleep apnea     Patient Active Problem List   Diagnosis Date Noted  . Acute pulmonary embolism ([place]) 06/30/2019  . Hypernatremia   . Asthma, chronic   . DKA (diabetic ketoacidoses) ([place]) 11/27/2014  . Diabetes mellitus, new onset ([place])   . 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 Essential hypertension   . Asthma     Past Surgical History:  Procedure Laterality Date  . BILATERAL CARPAL TUNNEL RELEASE       OB History   No obstetric  history on file.     Family History  Problem Relation Age of Onset  . Hypertension Mother   . Thyroid disease Mother   . Hypertension Father   . Stroke Father   . Diabetes Father   . Sleep apnea Father     Social History   Tobacco Use  . Smoking status: Never Smoker  . Smokeless tobacco: Never Used  Vaping Use  . Vaping Use: Never used  Substance Use Topics  . Alcohol use: Yes    Alcohol/week: 0.0 standard drinks    Comment: occasional   . Drug use: No    Home Medications Prior to Admission medications   Medication Sig Start Date End Date Taking? Authorizing Provider  apixaban (ELIQUIS) 5 MG TABS tablet Take 2 tablets (10 mg total) by mouth 2 (two) times daily for 5 days. 07/03/19 07/08/19  Thurnell Lose, MD  apixaban (ELIQUIS) 5 MG TABS tablet Take 1 tablet (5 mg total) by mouth 2 (two) times daily. 07/09/19   Thurnell Lose, MD  Blood Glucose Monitoring Suppl (ACCU-CHEK NANO SMARTVIEW) W/DEVICE KIT 1 Device by Does not apply route 4 (four) times daily - after meals and at bedtime. 11/29/14   Frazier Richards, MD  Cyanocobalamin 1000 MCG LOZG Take 2,000 mcg by mouth at bedtime.     [provider]  fexofenadine (ALLEGRA) 180 MG tablet Take 180 mg by mouth daily as needed.     [provider]  fluticasone (FLONASE) 50 MCG/ACT nasal spray Place 2 sprays into both nostrils daily as needed for allergies.  09/22/14   [provider]  glucose blood (ACCU-CHEK SMARTVIEW) test strip Check sugar 6 x daily Patient taking differently: Check sugar 6 x daily. Uses true test instead 11/29/14   Frazier Richards, MD  guaiFENesin-dextromethorphan [facility] DM) 100-10 MG/5ML syrup Take 5 mLs by mouth every 6 (six) hours as needed for cough. 07/03/19   Thurnell Lose, MD  HYDROcodone-homatropine [facility]) 5-1.5 MG/5ML syrup Take 5 mLs by mouth every 6 (six) hours as needed for cough. 12/15/19   Faustino Congress, NP  Insulin Glargine (LANTUS) 100 UNIT/ML Solostar Pen  Inject 35 Units into the skin daily. Patient taking differently: Inject 80 Units into the skin 2 (two) times daily.  11/29/14   Frazier Richards, MD  insulin lispro protamine-lispro (HUMALOG 75/25 MIX) (75-25) 100 UNIT/ML SUSP injection Inject 50 Units into the skin 2 (two) times daily with a meal.     [provider]  Insulin Pen Needle (INSUPEN PEN NEEDLES) 32G X 4 MM MISC BD Pen Needles- brand specific. Inject insulin via insulin pen daily 11/29/14   Frazier Richards, MD  Lancet Devices [facility]) lancets Use as instructed for blood glucose checks four times daily, before meals and at bedtime 11/29/14   Frazier Richards, MD  omeprazole (PRILOSEC) 40 MG capsule Take 40 mg by mouth daily.    [provider]  ondansetron (ZOFRAN) 4 MG tablet Take 1 tablet (4 mg total) by mouth every 8 (eight) hours as needed for nausea or vomiting. 07/03/19   Thurnell Lose, MD  rosuvastatin (CRESTOR) 10 MG tablet Take 10 mg by mouth 2 (two) times a week.    [provider]  SUMAtriptan (IMITREX) 100 MG tablet Take 1 tablet (100 mg total) by mouth every 2 (two) hours as needed for migraine or headache. 11/29/14   Frazier Richards, MD  topiramate (TOPAMAX) 100 MG tablet Take 200 mg by mouth at bedtime.     [provider]  TRUEPLUS LANCETS 33G MISC USE TO CHECK BLOOD GLUCOSE QID - AFTER MEALS AND AT BEDTIME 12/22/14   [provider]  valsartan (DIOVAN) 160 MG tablet Take 160 mg by mouth daily.    [provider]  VENTOLIN HFA 108 (90 BASE) MCG/ACT inhaler Use as directed 2 puffs in the mouth or throat 4 (four) times daily as needed. 12/21/14   [provider]    Allergies    Ibuprofen and Ketorolac  Review of Systems   Review of Systems  Constitutional: Negative for chills and fever.  HENT: Negative for ear pain and sore throat.   Eyes: Positive for visual disturbance. Negative for pain.  Respiratory: Negative for cough and shortness of breath.     Cardiovascular: Negative for chest pain and palpitations.  Gastrointestinal: Negative for abdominal pain and vomiting.  Genitourinary: Negative for dysuria and hematuria.  Musculoskeletal: Negative for arthralgias and back pain.  Skin: Negative for color change and rash.  Neurological: Positive for headaches. Negative for seizures, syncope and weakness.  Psychiatric/Behavioral: Negative for confusion.  All other systems reviewed and are negative.   Physical Exam Updated Vital Signs BP 115/87 (BP Location: Right Arm)   Pulse (!) 101   Temp 98.1 F (36.7 C) (Oral)   Resp  20   SpO2 99%   Physical Exam Vitals reviewed.  Constitutional:      General: She is not in acute distress.    Appearance: Normal appearance. She is not ill-appearing, toxic-appearing or diaphoretic.  HENT:     Head: Normocephalic and atraumatic.     Mouth/Throat:     Mouth: Mucous membranes are moist.     Pharynx: No oropharyngeal exudate.  Eyes:     General:        Right eye: No discharge.        Left eye: No discharge.     Extraocular Movements: Extraocular movements intact.     Conjunctiva/sclera: Conjunctivae normal.     Pupils: Pupils are equal, round, and reactive to light.  Cardiovascular:     Rate and Rhythm: Normal rate and regular rhythm.     Pulses: Normal pulses.     Heart sounds: Normal heart sounds.  Pulmonary:     Effort: Pulmonary effort is normal.     Breath sounds: Normal breath sounds.  Abdominal:     General: Abdomen is flat.  Musculoskeletal:        General: No swelling. Normal range of motion.     Cervical back: Normal range of motion.  Skin:    Findings: No rash.  Neurological:     General: No focal deficit present.     Mental Status: She is alert and oriented to person, place, and time.     Comments: Mental Status:  Alert, thought content appropriate, able to give a coherent history. Speech fluent without evidence of aphasia. Able to follow 2 step commands without  difficulty.  Cranial Nerves:  II: Peripheral visual fields grossly normal, pupils equal, round, reactive to light III,IV, VI: ptosis not present, extra-ocular motions intact bilaterally  V,VII: smile symmetric, facial light touch sensation equal VIII: hearing grossly normal to voice  X: uvula elevates symmetrically  XI: bilateral shoulder shrug symmetric and strong XII: midline tongue extension without fassiculations Motor:  Normal tone. 5/5 strength of BUE and BLE major muscle groups including strong and equal grip strength and dorsiflexion/plantar flexion Sensory: light touch normal in all extremities. Cerebellar: normal finger-to-nose with bilateral upper extremities, Romberg sign absent Gait: not accessed     Psychiatric:        Mood and Affect: Mood normal.        Behavior: Behavior normal.     ED Results / Procedures / Treatments   Labs (all labs ordered are listed, but only abnormal results are displayed) Labs Reviewed - No data to display  EKG None  Radiology No results found.  Procedures Procedures (including critical care time)  Medications Ordered in ED Medications  prochlorperazine (COMPAZINE) injection 10 mg (10 mg Intravenous Given 02/19/20 1410)  diphenhydrAMINE (BENADRYL) injection 25 mg (25 mg Intravenous Given 02/19/20 1412)  sodium chloride 0.9 % bolus 1,000 mL (1,000 mLs Intravenous New Bag/Given 02/19/20 1418)    ED Course  I have reviewed the triage vital signs and the nursing notes.  Pertinent labs & imaging results that were available during my care of the patient were reviewed by me and considered in my medical decision making (see chart for details).    MDM Rules/Calculators/A&P                          28:8 PM: 39 year old female with complaints of migraine On presentation, she is alert, oriented, nontoxic-appearing, with no gross neuro deficits, resting in  the ER bed with sunglasses of her eyes.  Her pupils are equal and reactive on exam,  no gross neuro deficits noted.  Gross vision intact on my exam.  She is afebrile, with reassuring vitals.  Partook in shared decision-making with the patient about whether or not to proceed with CT imaging.  We discussed that the patient has no neurologic abnormalities on exam, and does have a history of migraines, though her symptoms are atypical.  Patient would like to first attempt to treat her migraine and reassess.  I think this is reasonable.  Patient treated with Compazine, Benadryl, and given IV fluids.  Will reassess.  2:40 PM: On reassessment, patient had significant provement in her migraine.  She is currently receiving fluids.  States that she feels well enough that after she finished fluids, she would like to go home.  She does not want to move forward with a CT scan which I think is reasonable.    Return precautions discussed.  Encouraged her to follow-up with her PCP.  She voiced understanding and is agreeable.  At this stage in the ED course, the patient medically screened and stable for discharge.  Discussed the case with Dr. Tegeler who is agreeable with the above plan and disposition  Final Clinical Impression(s) / ED Diagnoses Final diagnoses:  Migraine without status migrainosus, not intractable, unspecified migraine type    Rx / DC Orders ED Discharge Orders    None       ,  A, PA-C 02/19/20 1451    Tegeler, Christopher J, MD 02/19/20 1520  

## 2020-02-19 NOTE — Discharge Instructions (Signed)
Your work-up today was overall reassuring.  As discussed, our concern for stroke today is low, however there is a small chance with not moving for the CT scan.  You have any new or worsening symptoms, please make sure to return to the ER to be further evaluated.  Please make sure to follow-up with your primary care doctor about the symptoms you are experiencing today.

## 2020-03-27 ENCOUNTER — Emergency Department (HOSPITAL_COMMUNITY): Payer: 59

## 2020-03-27 ENCOUNTER — Encounter (HOSPITAL_COMMUNITY): Payer: Self-pay

## 2020-03-27 ENCOUNTER — Other Ambulatory Visit: Payer: Self-pay

## 2020-03-27 ENCOUNTER — Inpatient Hospital Stay (HOSPITAL_COMMUNITY)
Admission: EM | Admit: 2020-03-27 | Discharge: 2020-03-30 | DRG: 065 | Disposition: A | Discharge status: Home / Self Care | Payer: 59 | Attending: Internal Medicine | Admitting: Internal Medicine

## 2020-03-27 ENCOUNTER — Inpatient Hospital Stay (HOSPITAL_COMMUNITY): Payer: 59

## 2020-03-27 DIAGNOSIS — U099 Post covid-19 condition, unspecified: Secondary | ICD-10-CM | POA: Diagnosis present

## 2020-03-27 DIAGNOSIS — I152 Hypertension secondary to endocrine disorders: Secondary | ICD-10-CM | POA: Diagnosis not present

## 2020-03-27 DIAGNOSIS — I420 Dilated cardiomyopathy: Secondary | ICD-10-CM | POA: Diagnosis present

## 2020-03-27 DIAGNOSIS — R109 Unspecified abdominal pain: Secondary | ICD-10-CM | POA: Diagnosis present

## 2020-03-27 DIAGNOSIS — Z7901 Long term (current) use of anticoagulants: Secondary | ICD-10-CM

## 2020-03-27 DIAGNOSIS — R29810 Facial weakness: Secondary | ICD-10-CM | POA: Diagnosis present

## 2020-03-27 DIAGNOSIS — Z6838 Body mass index (BMI) 38.0-38.9, adult: Secondary | ICD-10-CM

## 2020-03-27 DIAGNOSIS — Q211 Atrial septal defect: Secondary | ICD-10-CM

## 2020-03-27 DIAGNOSIS — E1165 Type 2 diabetes mellitus with hyperglycemia: Secondary | ICD-10-CM | POA: Diagnosis present

## 2020-03-27 DIAGNOSIS — Z8701 Personal history of pneumonia (recurrent): Secondary | ICD-10-CM

## 2020-03-27 DIAGNOSIS — Z86711 Personal history of pulmonary embolism: Secondary | ICD-10-CM

## 2020-03-27 DIAGNOSIS — G8194 Hemiplegia, unspecified affecting left nondominant side: Secondary | ICD-10-CM | POA: Diagnosis present

## 2020-03-27 DIAGNOSIS — Z20822 Contact with and (suspected) exposure to covid-19: Secondary | ICD-10-CM | POA: Diagnosis present

## 2020-03-27 DIAGNOSIS — I10 Essential (primary) hypertension: Secondary | ICD-10-CM | POA: Diagnosis present

## 2020-03-27 DIAGNOSIS — Z79899 Other long term (current) drug therapy: Secondary | ICD-10-CM

## 2020-03-27 DIAGNOSIS — R471 Dysarthria and anarthria: Secondary | ICD-10-CM | POA: Diagnosis present

## 2020-03-27 DIAGNOSIS — E8881 Metabolic syndrome: Secondary | ICD-10-CM | POA: Diagnosis present

## 2020-03-27 DIAGNOSIS — G4730 Sleep apnea, unspecified: Secondary | ICD-10-CM | POA: Diagnosis present

## 2020-03-27 DIAGNOSIS — G43909 Migraine, unspecified, not intractable, without status migrainosus: Secondary | ICD-10-CM | POA: Diagnosis present

## 2020-03-27 DIAGNOSIS — E785 Hyperlipidemia, unspecified: Secondary | ICD-10-CM | POA: Diagnosis present

## 2020-03-27 DIAGNOSIS — R29707 NIHSS score 7: Secondary | ICD-10-CM | POA: Diagnosis present

## 2020-03-27 DIAGNOSIS — E669 Obesity, unspecified: Secondary | ICD-10-CM | POA: Insufficient documentation

## 2020-03-27 DIAGNOSIS — Z794 Long term (current) use of insulin: Secondary | ICD-10-CM | POA: Diagnosis not present

## 2020-03-27 DIAGNOSIS — E119 Type 2 diabetes mellitus without complications: Secondary | ICD-10-CM

## 2020-03-27 DIAGNOSIS — J45909 Unspecified asthma, uncomplicated: Secondary | ICD-10-CM | POA: Diagnosis present

## 2020-03-27 DIAGNOSIS — I639 Cerebral infarction, unspecified: Secondary | ICD-10-CM | POA: Diagnosis present

## 2020-03-27 DIAGNOSIS — Z8616 Personal history of COVID-19: Secondary | ICD-10-CM | POA: Diagnosis not present

## 2020-03-27 DIAGNOSIS — Z8249 Family history of ischemic heart disease and other diseases of the circulatory system: Secondary | ICD-10-CM

## 2020-03-27 DIAGNOSIS — Z823 Family history of stroke: Secondary | ICD-10-CM

## 2020-03-27 DIAGNOSIS — Z888 Allergy status to other drugs, medicaments and biological substances status: Secondary | ICD-10-CM | POA: Diagnosis not present

## 2020-03-27 DIAGNOSIS — G43709 Chronic migraine without aura, not intractable, without status migrainosus: Secondary | ICD-10-CM | POA: Diagnosis not present

## 2020-03-27 DIAGNOSIS — G4733 Obstructive sleep apnea (adult) (pediatric): Secondary | ICD-10-CM | POA: Diagnosis present

## 2020-03-27 DIAGNOSIS — I6389 Other cerebral infarction: Secondary | ICD-10-CM | POA: Diagnosis not present

## 2020-03-27 DIAGNOSIS — I2699 Other pulmonary embolism without acute cor pulmonale: Secondary | ICD-10-CM | POA: Diagnosis present

## 2020-03-27 DIAGNOSIS — I429 Cardiomyopathy, unspecified: Secondary | ICD-10-CM | POA: Diagnosis not present

## 2020-03-27 DIAGNOSIS — I63411 Cerebral infarction due to embolism of right middle cerebral artery: Secondary | ICD-10-CM | POA: Diagnosis present

## 2020-03-27 DIAGNOSIS — Z833 Family history of diabetes mellitus: Secondary | ICD-10-CM

## 2020-03-27 HISTORY — DX: Post covid-19 condition, unspecified: U09.9

## 2020-03-27 LAB — I-STAT CHEM 8, ED
BUN: 7 mg/dL (ref 6–20)
Calcium, Ion: 1.17 mmol/L (ref 1.15–1.40)
Chloride: 103 mmol/L (ref 98–111)
Creatinine, Ser: 0.7 mg/dL (ref 0.44–1.00)
Glucose, Bld: 363 mg/dL — ABNORMAL HIGH (ref 70–99)
HCT: 38 % (ref 36.0–46.0)
Hemoglobin: 12.9 g/dL (ref 12.0–15.0)
Potassium: 3.7 mmol/L (ref 3.5–5.1)
Sodium: 137 mmol/L (ref 135–145)
TCO2: 20 mmol/L — ABNORMAL LOW (ref 22–32)

## 2020-03-27 LAB — DIFFERENTIAL
Abs Immature Granulocytes: 0.04 10*3/uL (ref 0.00–0.07)
Basophils Absolute: 0 10*3/uL (ref 0.0–0.1)
Basophils Relative: 0 %
Eosinophils Absolute: 0 10*3/uL (ref 0.0–0.5)
Eosinophils Relative: 0 %
Immature Granulocytes: 0 %
Lymphocytes Relative: 12 %
Lymphs Abs: 1.4 10*3/uL (ref 0.7–4.0)
Monocytes Absolute: 0.5 10*3/uL (ref 0.1–1.0)
Monocytes Relative: 4 %
Neutro Abs: 9.7 10*3/uL — ABNORMAL HIGH (ref 1.7–7.7)
Neutrophils Relative %: 84 %

## 2020-03-27 LAB — URINALYSIS, MICROSCOPIC (REFLEX)

## 2020-03-27 LAB — CBC
HCT: 39.3 % (ref 36.0–46.0)
Hemoglobin: 12.8 g/dL (ref 12.0–15.0)
MCH: 28.2 pg (ref 26.0–34.0)
MCHC: 32.6 g/dL (ref 30.0–36.0)
MCV: 86.6 fL (ref 80.0–100.0)
Platelets: 195 10*3/uL (ref 150–400)
RBC: 4.54 MIL/uL (ref 3.87–5.11)
RDW: 13.1 % (ref 11.5–15.5)
WBC: 11.7 10*3/uL — ABNORMAL HIGH (ref 4.0–10.5)
nRBC: 0 % (ref 0.0–0.2)

## 2020-03-27 LAB — RAPID URINE DRUG SCREEN, HOSP PERFORMED
Amphetamines: NOT DETECTED
Barbiturates: NOT DETECTED
Benzodiazepines: NOT DETECTED
Cocaine: NOT DETECTED
Opiates: NOT DETECTED
Tetrahydrocannabinol: NOT DETECTED

## 2020-03-27 LAB — URINALYSIS, ROUTINE W REFLEX MICROSCOPIC

## 2020-03-27 LAB — I-STAT BETA HCG BLOOD, ED (MC, WL, AP ONLY): I-stat hCG, quantitative: <5 m[IU]/mL (ref <5)

## 2020-03-27 LAB — COMPREHENSIVE METABOLIC PANEL
ALT: 18 U/L (ref 0–44)
AST: 13 U/L — ABNORMAL LOW (ref 15–41)
Albumin: 3.1 g/dL — ABNORMAL LOW (ref 3.5–5.0)
Alkaline Phosphatase: 82 U/L (ref 38–126)
Anion gap: 8 (ref 5–15)
BUN: 5 mg/dL — ABNORMAL LOW (ref 6–20)
CO2: 21 mmol/L — ABNORMAL LOW (ref 22–32)
Calcium: 8.4 mg/dL — ABNORMAL LOW (ref 8.9–10.3)
Chloride: 104 mmol/L (ref 98–111)
Creatinine, Ser: 0.88 mg/dL (ref 0.44–1.00)
GFR, Estimated: >60 mL/min (ref >60)
Glucose, Bld: 373 mg/dL — ABNORMAL HIGH (ref 70–99)
Potassium: 3.6 mmol/L (ref 3.5–5.1)
Sodium: 133 mmol/L — ABNORMAL LOW (ref 135–145)
Total Bilirubin: 1 mg/dL (ref 0.3–1.2)
Total Protein: 6.2 g/dL — ABNORMAL LOW (ref 6.5–8.1)

## 2020-03-27 LAB — APTT: aPTT: 22 seconds — ABNORMAL LOW (ref 24–36)

## 2020-03-27 LAB — ECHOCARDIOGRAM COMPLETE BUBBLE STUDY
Area-P 1/2: 3.31 cm2
S' Lateral: 4.5 cm

## 2020-03-27 LAB — BETA-HYDROXYBUTYRIC ACID: Beta-Hydroxybutyric Acid: 0.67 mmol/L — ABNORMAL HIGH (ref 0.05–0.27)

## 2020-03-27 LAB — RESPIRATORY PANEL BY RT PCR (FLU A&B, COVID)
Influenza A by PCR: NEGATIVE
Influenza B by PCR: NEGATIVE
SARS Coronavirus 2 by RT PCR: NEGATIVE

## 2020-03-27 LAB — CBG MONITORING, ED
Glucose-Capillary: 346 mg/dL — ABNORMAL HIGH (ref 70–99)
Glucose-Capillary: 303 mg/dL — ABNORMAL HIGH (ref 70–99)

## 2020-03-27 LAB — PROTIME-INR
INR: 1.2 (ref 0.8–1.2)
Prothrombin Time: 14.6 seconds (ref 11.4–15.2)

## 2020-03-27 LAB — ETHANOL: Alcohol, Ethyl (B): <10 mg/dL (ref <10)

## 2020-03-27 LAB — GLUCOSE, CAPILLARY: Glucose-Capillary: 254 mg/dL — ABNORMAL HIGH (ref 70–99)

## 2020-03-27 LAB — HEMOGLOBIN A1C
Hgb A1c MFr Bld: 11.2 % — ABNORMAL HIGH (ref 4.8–5.6)
Mean Plasma Glucose: 274.74 mg/dL

## 2020-03-27 MED ORDER — IOHEXOL 350 MG/ML SOLN
100.0000 mL | Freq: Once | INTRAVENOUS | Status: AC | PRN
  Administered 2020-03-27: 100 mL via INTRAVENOUS

## 2020-03-27 MED ORDER — INSULIN GLARGINE 100 UNIT/ML ~~LOC~~ SOLN
80.0000 [IU] | Freq: Two times a day (BID) | SUBCUTANEOUS | Status: DC
  Administered 2020-03-27 – 2020-03-30 (×5): 80 [IU] via SUBCUTANEOUS
  Filled 2020-03-27 (×8): qty 0.8

## 2020-03-27 MED ORDER — ACETAMINOPHEN 650 MG RE SUPP: 650.0000 mg | RECTAL | Status: DC | PRN

## 2020-03-27 MED ORDER — ROSUVASTATIN CALCIUM 5 MG PO TABS
10.0000 mg | ORAL_TABLET | Freq: Every day | ORAL | Status: DC
  Filled 2020-03-27: qty 2

## 2020-03-27 MED ORDER — NORTRIPTYLINE HCL 10 MG PO CAPS
10.0000 mg | ORAL_CAPSULE | Freq: Every day | ORAL | Status: DC
  Administered 2020-03-28 – 2020-03-29 (×2): 10 mg via ORAL
  Filled 2020-03-27 (×4): qty 1

## 2020-03-27 MED ORDER — PANTOPRAZOLE SODIUM 40 MG PO TBEC
40.0000 mg | DELAYED_RELEASE_TABLET | Freq: Every day | ORAL | Status: DC
  Administered 2020-03-28 – 2020-03-30 (×3): 40 mg via ORAL
  Filled 2020-03-27 (×3): qty 1

## 2020-03-27 MED ORDER — STROKE: EARLY STAGES OF RECOVERY BOOK
Freq: Once | Status: AC
  Filled 2020-03-27: qty 1

## 2020-03-27 MED ORDER — PERFLUTREN LIPID MICROSPHERE
1.0000 mL | INTRAVENOUS | Status: DC | PRN
  Administered 2020-03-27: 2 mL via INTRAVENOUS
  Filled 2020-03-27: qty 10

## 2020-03-27 MED ORDER — ASPIRIN 325 MG PO TABS
325.0000 mg | ORAL_TABLET | Freq: Every day | ORAL | Status: DC
  Administered 2020-03-27 – 2020-03-30 (×3): 325 mg via ORAL
  Filled 2020-03-27 (×3): qty 1

## 2020-03-27 MED ORDER — ATORVASTATIN CALCIUM 40 MG PO TABS: 40.0000 mg | ORAL_TABLET | Freq: Every day | ORAL | Status: DC

## 2020-03-27 MED ORDER — ESCITALOPRAM OXALATE 10 MG PO TABS
10.0000 mg | ORAL_TABLET | Freq: Every day | ORAL | Status: DC
  Administered 2020-03-28 – 2020-03-30 (×3): 10 mg via ORAL
  Filled 2020-03-27 (×3): qty 1

## 2020-03-27 MED ORDER — INSULIN ASPART 100 UNIT/ML ~~LOC~~ SOLN
0.0000 [IU] | Freq: Every day | SUBCUTANEOUS | Status: DC
  Administered 2020-03-27 – 2020-03-28 (×2): 3 [IU] via SUBCUTANEOUS
  Administered 2020-03-29: 2 [IU] via SUBCUTANEOUS

## 2020-03-27 MED ORDER — ASPIRIN 300 MG RE SUPP
300.0000 mg | Freq: Every day | RECTAL | Status: DC
  Administered 2020-03-28: 300 mg via RECTAL
  Filled 2020-03-27: qty 1

## 2020-03-27 MED ORDER — ONDANSETRON HCL 4 MG/2ML IJ SOLN
4.0000 mg | Freq: Once | INTRAMUSCULAR | Status: AC
  Administered 2020-03-27: 4 mg via INTRAVENOUS
  Filled 2020-03-27: qty 2

## 2020-03-27 MED ORDER — FENTANYL CITRATE (PF) 100 MCG/2ML IJ SOLN
50.0000 ug | Freq: Once | INTRAMUSCULAR | Status: AC
  Administered 2020-03-27: 50 ug via INTRAVENOUS
  Filled 2020-03-27: qty 2

## 2020-03-27 MED ORDER — ALBUTEROL SULFATE HFA 108 (90 BASE) MCG/ACT IN AERS
2.0000 | INHALATION_SPRAY | Freq: Four times a day (QID) | RESPIRATORY_TRACT | Status: DC | PRN
  Filled 2020-03-27: qty 6.7

## 2020-03-27 MED ORDER — ACETAMINOPHEN 160 MG/5ML PO SOLN: 650.0000 mg | ORAL | Status: DC | PRN

## 2020-03-27 MED ORDER — ENOXAPARIN SODIUM 40 MG/0.4ML ~~LOC~~ SOLN
40.0000 mg | SUBCUTANEOUS | Status: DC
  Administered 2020-03-27 – 2020-03-29 (×3): 40 mg via SUBCUTANEOUS
  Filled 2020-03-27 (×3): qty 0.4

## 2020-03-27 MED ORDER — SODIUM CHLORIDE 0.9 % IV SOLN: INTRAVENOUS | Status: DC

## 2020-03-27 MED ORDER — IOHEXOL 300 MG/ML  SOLN
60.0000 mL | Freq: Once | INTRAMUSCULAR | Status: AC | PRN
  Administered 2020-03-27: 60 mL via INTRAVENOUS

## 2020-03-27 MED ORDER — TOPIRAMATE 100 MG PO TABS
200.0000 mg | ORAL_TABLET | Freq: Every day | ORAL | Status: DC
  Administered 2020-03-28 – 2020-03-29 (×2): 200 mg via ORAL
  Filled 2020-03-27 (×3): qty 2

## 2020-03-27 MED ORDER — SODIUM CHLORIDE 0.9 % IV BOLUS
1000.0000 mL | Freq: Once | INTRAVENOUS | Status: AC
  Administered 2020-03-27: 1000 mL via INTRAVENOUS

## 2020-03-27 MED ORDER — INSULIN ASPART 100 UNIT/ML ~~LOC~~ SOLN
0.0000 [IU] | Freq: Three times a day (TID) | SUBCUTANEOUS | Status: DC
  Administered 2020-03-27: 11 [IU] via SUBCUTANEOUS
  Administered 2020-03-28 – 2020-03-29 (×3): 3 [IU] via SUBCUTANEOUS
  Administered 2020-03-29: 2 [IU] via SUBCUTANEOUS
  Administered 2020-03-29 – 2020-03-30 (×2): 3 [IU] via SUBCUTANEOUS

## 2020-03-27 MED ORDER — ACETAMINOPHEN 325 MG PO TABS
650.0000 mg | ORAL_TABLET | ORAL | Status: DC | PRN
  Administered 2020-03-29: 650 mg via ORAL
  Filled 2020-03-27: qty 2

## 2020-03-27 MED ORDER — BUDESONIDE 0.25 MG/2ML IN SUSP
0.2500 mg | Freq: Two times a day (BID) | RESPIRATORY_TRACT | Status: DC
  Administered 2020-03-28 – 2020-03-30 (×5): 0.25 mg via RESPIRATORY_TRACT
  Filled 2020-03-27 (×6): qty 2

## 2020-03-27 MED ORDER — SENNOSIDES-DOCUSATE SODIUM 8.6-50 MG PO TABS: 1.0000 | ORAL_TABLET | Freq: Every evening | ORAL | Status: DC | PRN

## 2020-03-27 NOTE — Consult Note (Signed)
CARDIOLOGY CONSULT NOTE  Patient ID: Shelly Shields MRN: [id number] DOB/AGE: 01/19/1981 39 y.o.  Admit date: 03/27/2020 Referring Physician  Jennifer Yates, MD Primary Physician:  Whyte, Thomas M, MD Reason for Consultation  Cardiomyopathy  Patient ID: Shelly Shields, female    DOB: 10/24/1980, 39 y.o.   MRN: [id number]  Chief Complaint  Patient presents with  . Code Stroke   HPI:    Shelly Shields  is a 39 y.o. African-American female with hypertension, hyperlipidemia, uncontrolled diabetes mellitus, obesity and obstructive sleep apnea on CPAP and history of Covid pneumonia in January 2021 and also had pulmonary embolism during Covid infection.  She is also being evaluated for vasculitis.  She is now admitted to the hospital with dysarthria and left-sided weakness that she discovered when she woke up in the morning and in the ED found to have right internal capsule stroke and also found to have right renal multiple infarct, concerning for embolic phenomena.  An echocardiogram also done today revealed severe LV systolic dysfunction along with anterior hypokinesis.  Patient presently denies any chest pain or shortness of breath.  She still has mild abdominal discomfort and was emotional when I saw her.  Still has dysarthria.  Her mother is present at the bedside.  Past Medical History:  Diagnosis Date  . Asthma   . COVID-19 long hauler   . Diabetes mellitus without complication (HCC)   . Hypertension   . Migraines   . Obesity   . Pulmonary embolism (HCC) 05/2019   with COVID  . Sleep apnea    Past Surgical History:  Procedure Laterality Date  . BILATERAL CARPAL TUNNEL RELEASE     Social History   Tobacco Use  . Smoking status: Never Smoker  . Smokeless tobacco: Never Used  Substance Use Topics  . Alcohol use: Yes    Alcohol/week: 0.0 standard drinks    Comment: occasional     Family History  Problem Relation Age of Onset  . Hypertension Mother   . Thyroid disease  Mother   . Hypertension Father   . Stroke Father 52  . Diabetes Father   . Sleep apnea Father   . CVA Paternal Uncle 57    Marital Sttus: Single  ROS  Review of Systems  HENT: Negative.   Cardiovascular: Negative for chest pain, dyspnea on exertion and leg swelling.  Skin: Positive for color change and rash.  Gastrointestinal: Negative.  Negative for melena.  Genitourinary: Positive for flank pain.  Neurological: Positive for disturbances in coordination and focal weakness (left arm and left leg).  All other systems reviewed and are negative.  Objective   Vitals with BMI 03/27/2020 03/27/2020 03/27/2020  Height - - -  Weight - - -  BMI - - -  Systolic 136 146 -  Diastolic 101 103 -  Pulse 100 97 105    Blood pressure (!) 136/101, pulse 100, temperature 98 F (36.7 C), temperature source Oral, resp. rate (!) 21, SpO2 98 %.    Physical Exam Cardiovascular:     Rate and Rhythm: Regular rhythm. Tachycardia present.     Pulses: Normal pulses and intact distal pulses.     Heart sounds: No murmur heard.  Gallop present. S3 sounds present.      Comments: No leg edema, no JVD. Pulmonary:     Effort: Pulmonary effort is normal.     Breath sounds: Normal breath sounds.  Abdominal:     General: Bowel sounds are normal.       Palpations: Abdomen is soft.     Tenderness: There is no abdominal tenderness. There is no guarding.    Laboratory examination:    Recent Labs    07/01/19 0420 07/01/19 0420 07/02/19 0500 07/02/19 0500 02/06/20 2118 03/27/20 0946 03/27/20 1002  NA 137   < > 138   < > 139 133* 137  K 3.3*   < > 3.5   < > 4.0 3.6 3.7  CL 101   < > 108   < > 108 104 103  CO2 23   < > 20*  --  21* 21*  --   GLUCOSE 195*   < > 153*   < > 238* 373* 363*  BUN 12   < > 12   < > 14 5* 7  CREATININE 0.77   < > 0.75   < > 1.13* 0.88 0.70  CALCIUM 9.0   < > 9.1  --  9.0 8.4*  --   GFRNONAA >60   < > >60  --  >60 >60  --   GFRAA >60  --  >60  --  >60  --   --    < > =  values in this interval not displayed.   CrCl cannot be calculated (Unknown ideal weight.).  CMP Latest Ref Rng & Units 03/27/2020 03/27/2020 02/06/2020  Glucose 70 - 99 mg/dL 363(H) 373(H) 238(H)  BUN 6 - 20 mg/dL 7 5(L) 14  Creatinine 0.44 - 1.00 mg/dL 0.70 0.88 1.13(H)  Sodium 135 - 145 mmol/L 137 133(L) 139  Potassium 3.5 - 5.1 mmol/L 3.7 3.6 4.0  Chloride 98 - 111 mmol/L 103 104 108  CO2 22 - 32 mmol/L - 21(L) 21(L)  Calcium 8.9 - 10.3 mg/dL - 8.4(L) 9.0  Total Protein 6.5 - 8.1 g/dL - 6.2(L) 7.3  Total Bilirubin 0.3 - 1.2 mg/dL - 1.0 1.3(H)  Alkaline Phos 38 - 126 U/L - 82 85  AST 15 - 41 U/L - 13(L) 18  ALT 0 - 44 U/L - 18 27   CBC Latest Ref Rng & Units 03/27/2020 03/27/2020 02/06/2020  WBC 4.0 - 10.5 K/uL - 11.7(H) 14.8(H)  Hemoglobin 12.0 - 15.0 g/dL 12.9 12.8 14.5  Hematocrit 36 - 46 % 38.0 39.3 45.5  Platelets 150 - 400 K/uL - 195 308   Lipid Panel No results for input(s): CHOL, TRIG, LDLCALC, VLDL, HDL, CHOLHDL, LDLDIRECT in the last 8760 hours.  HEMOGLOBIN A1C Lab Results  Component Value Date   HGBA1C 11.2 (H) 03/27/2020   MPG 274.74 03/27/2020   TSH No results for input(s): TSH in the last 8760 hours. BNP (last 3 results) Recent Labs    07/01/19 0420 07/02/19 0500  BNP 66.4 51.1    Medications and allergies   Allergies  Allergen Reactions  . Lisinopril Cough  . Metformin Diarrhea  . Ibuprofen Hives  . Ketorolac Hives    Current Meds  Medication Sig  . Baclofen 5 MG TABS TAKE 1 TABLET BY MOUTH THREE TIMES DAILY AS NEEDED FOR HEADACHE OR NECK PAIN  . Blood Glucose Monitoring Suppl (ACCU-CHEK NANO SMARTVIEW) W/DEVICE KIT 1 Device by Does not apply route 4 (four) times daily - after meals and at bedtime.  . escitalopram (LEXAPRO) 10 MG tablet Take 10 mg by mouth daily.  . fexofenadine (ALLEGRA) 180 MG tablet Take 180 mg by mouth daily as needed.   . fluticasone (FLONASE) 50 MCG/ACT nasal spray Place 2 sprays into both nostrils daily as needed for    allergies.   . fluticasone (FLOVENT HFA) 110 MCG/ACT inhaler Inhale 1 puff into the lungs daily.   . glucose blood (ACCU-CHEK SMARTVIEW) test strip Check sugar 6 x daily (Patient taking differently: Check sugar 6 x daily. Uses true test instead)  . Insulin Glargine (LANTUS) 100 UNIT/ML Solostar Pen Inject 35 Units into the skin daily. (Patient taking differently: Inject 80 Units into the skin 2 (two) times daily. )  . insulin lispro protamine-lispro (HUMALOG 75/25 MIX) (75-25) 100 UNIT/ML SUSP injection Inject 50 Units into the skin 2 (two) times daily with a meal.   . Insulin Pen Needle (INSUPEN PEN NEEDLES) 32G X 4 MM MISC BD Pen Needles- brand specific. Inject insulin via insulin pen daily  . Lancet Devices (ACCU-CHEK SOFTCLIX) lancets Use as instructed for blood glucose checks four times daily, before meals and at bedtime  . medroxyPROGESTERone (DEPO-PROVERA) 150 MG/ML injection Inject 150 mg into the muscle every 3 (three) months.  . nortriptyline (PAMELOR) 10 MG capsule Take 10 mg by mouth at bedtime.  . ondansetron (ZOFRAN) 4 MG tablet Take 1 tablet (4 mg total) by mouth every 8 (eight) hours as needed for nausea or vomiting.  . pantoprazole (PROTONIX) 40 MG tablet Take 40 mg by mouth daily.  . rosuvastatin (CRESTOR) 10 MG tablet Take 10 mg by mouth 2 (two) times a week.  . SUMAtriptan (IMITREX) 100 MG tablet Take 1 tablet (100 mg total) by mouth every 2 (two) hours as needed for migraine or headache.  . topiramate (TOPAMAX) 100 MG tablet Take 200 mg by mouth at bedtime.   . TRUEPLUS LANCETS 33G MISC USE TO CHECK BLOOD GLUCOSE QID - AFTER MEALS AND AT BEDTIME  . valsartan (DIOVAN) 160 MG tablet Take 160 mg by mouth daily.  . VENTOLIN HFA 108 (90 BASE) MCG/ACT inhaler Use as directed 2 puffs in the mouth or throat 4 (four) times daily as needed for shortness of breath.     Scheduled Meds: . aspirin  300 mg Rectal Daily   Or  . aspirin  325 mg Oral Daily  . [START ON 03/28/2020] budesonide   0.25 mg Inhalation BID  . enoxaparin (LOVENOX) injection  40 mg Subcutaneous Q24H  . [START ON 03/28/2020] escitalopram  10 mg Oral Daily  . insulin aspart  0-15 Units Subcutaneous TID WC  . insulin aspart  0-5 Units Subcutaneous QHS  . insulin glargine  80 Units Subcutaneous BID  . nortriptyline  10 mg Oral QHS  . [START ON 03/28/2020] pantoprazole  40 mg Oral Daily  . rosuvastatin  10 mg Oral Daily  . topiramate  200 mg Oral QHS   Continuous Infusions: . sodium chloride 50 mL/hr at 03/27/20 1442   PRN Meds:.acetaminophen **OR** acetaminophen (TYLENOL) oral liquid 160 mg/5 mL **OR** acetaminophen, albuterol, senna-docusate   No intake/output data recorded. No intake/output data recorded.   Radiology:   CT Code Stroke CTA Head W/WO contrast 03/27/2020 1. Negative for large vessel occlusion but positive for Right MCA M3 occlusion. CT Perfusion detects core infarct with minimally larger penumbra at the right insula/operculum corresponding to the plain CT finding.  2. Additionally, mild vessel irregularity is noted in multiple other circle-of-Willis branches, including the left A1, right P2, left M2. Although nonspecific this constellation of clinical and imaging findings might indicate accelerated branch vessel atherosclerosis. Although other large vessels appear normal, with no atherosclerosis in the neck or at the aortic arch.  MR ANGIO HEAD WO CONTRAST  03/27/2020  1. Moderate-sized acute   right MCA infarct. 2. Small subacute left parieto-occipital infarcts. 3. Right M3 branch occlusion, otherwise negative head MRA.0200  CT ABDOMEN PELVIS W CONTRAST  03/27/2020  1. New 18 mm hypoattenuating lesion upper pole right kidney with areas of subcapsular multifocal decreased enhancement in the lower pole right kidney. Imaging features are nonspecific and could be related to pyelonephritis and phlegmon/evolving abscess in the upper pole right kidney cannot be excluded. Alternatively, multifocal right  renal infarct could have this appearance. Correlation with urinalysis may prove helpful.  2. Relatively well-defined 2.2 cm lesion in the inferior lingula measures water density. This may be loculated pleural fluid or intraparenchymal fluid collection in this patient with a history of pulmonary embolus and bilateral ground-glass airspace disease on the previous study suspicious for multifocal atypical/viral pneumonia. Consider follow-up to ensure resolution.  Cardiac Studies:   ECHOCARDIOGRAM COMPLETE BUBBLE STUDY  03/27/2020 1. Left ventricular ejection fraction, by estimation, is 20 to 25%. The left ventricle has severely decreased function. There is severe hypokinesis of the basal-to-apical anteroseptum, inferoseptum and anterior LV walls . Left ventricular diastolic  parameters are consistent with Grade II diastolic dysfunction (pseudonormalization).  2. Right ventricular systolic function is normal. The right ventricular size is normal.  3. Left atrial size was moderately dilated.  4. The mitral valve is normal in structure. Trivial mitral valve regurgitation.  5. The aortic valve is tricuspid. There is mild thickening of the aortic valve. Aortic valve regurgitation is not visualized.  6. The inferior vena cava is normal in size with greater than 50% respiratory variability, suggesting right atrial pressure of 3 mmHg.  7. Agitated saline contrast bubble study was negative, with no evidence of any interatrial shunt.  Comparison(s): Compared to prior echo in 06/2019, the LVEF is now severely reduced to 20-25% with severe hypokinesis of the inferoseptal, anteroseptal and anterior LV walls.  EKG:  EKG 03/27/2020: Sinus tachycardia at rate of 102 bpm, normal axis, borderline criteria for LVH.  Borderline QT elongation.  Compared to 06/30/2019, sinus tachycardia new.  No change in QT elongation.  Assessment  Shelly Shields  is a  39 y.o. African-American female with hypertension, hyperlipidemia,  uncontrolled diabetes mellitus, obesity and obstructive sleep apnea on CPAP and history of Covid pneumonia in January 2021 and also had pulmonary embolism during Covid infection.  She is also being evaluated for vasculitis. 1. Acute embolic CVA with left hemiparesis.  2.  Cardiomyopathy with severe LV systolic dysfunction, new finding since February 2021 echocardiogram.  Wall motion abnormality suggests either ischemic etiology versus nonischemic stress cardiomyopathy related to acute stroke. 3.  Hypertension 4.  Diabetes mellitus type 2 uncontrolled with hyperglycemia without complications. 5.  Hyperlipidemia 6.  Moderate obesity with obstructive sleep apnea on CPAP  Recommendations:   Patient will need TEE to better define the etiology for her embolic CVA.  With multiple strokes in the brain and also suspicion for right renal infarct, cardiac etiology appears to be most likely.  She does have risk factors for atrial fibrillation including obesity, OSA, hypertension as well and there is moderate left atrial enlargement.  Negative for PFO.  Mural thrombus also need to be excluded in view of wall motion abnormality.  I would have a very low threshold to start anticoagulation if neurology team agrees.  I will try to set up the TEE tomorrow if she is stable.  With regard to cardiomyopathy, she will eventually need ischemic work-up in view of underlying cardiac risk factors including diabetes, hypertension and hyperlipidemia.    I will not start any therapy for heart failure right now she is well compensated without clinical evidence of heart failure, BNP is also normal.  I will let primary team and neurology to manage her blood pressure as per the recommendations.  Continue statins, continue CPAP for OSA and telemetry for monitoring of arrhythmias.  This was a 60-minute encounter, I have personally reviewed her echocardiogram, EKGs and also her labs.  Patient's mother is at the bedside and all questions  answered.   Adrian Prows, MD, [facility] 03/27/2020, 6:57 PM Office: [phone]

## 2020-03-27 NOTE — ED Notes (Signed)
Due to patient's severe left side facial droop and aphasia, this RN felt necessary to repeat swallow test. The family at bedside reminded this RN that patient is working with ST and could swallow if she slowed down. Pt was given the 17mL water and was instructed to drink slow but to not stop. Pt drank it slow and stopped twice. She handed me the water and started clearing her throat. Pt only drank half. Dr. Ophelia Charter was notified via chat messenger. Order was to keep her NPO.

## 2020-03-27 NOTE — Progress Notes (Signed)
  Echocardiogram 2D Echocardiogram has been attempted. Patient still in CT. Will reattempt at later time.   G  03/27/2020, 11:41 AM

## 2020-03-27 NOTE — ED Notes (Signed)
Patient transported to MRI 

## 2020-03-27 NOTE — Progress Notes (Signed)
  Echocardiogram 2D Echocardiogram has been performed.   G  03/27/2020, 1:26 PM

## 2020-03-27 NOTE — Evaluation (Signed)
Clinical/Bedside Swallow Evaluation Patient Details  Name: Shelly Shields MRN: [id number] Date of Birth: 1980-07-03  Today's Date: 03/27/2020 Time: SLP Start Time (ACUTE ONLY): 1407 SLP Stop Time (ACUTE ONLY): 1434 SLP Time Calculation (min) (ACUTE ONLY): 27 min  Past Medical History:  Past Medical History:  Diagnosis Date  . Asthma   . COVID-19 long hauler   . Diabetes mellitus without complication (HCC)   . Hypertension   . Migraines   . Obesity   . Pulmonary embolism (HCC) 05/2019   with COVID  . Sleep apnea    Past Surgical History:  Past Surgical History:  Procedure Laterality Date  . BILATERAL CARPAL TUNNEL RELEASE     HPI:  39 year old female with history of diabetes, hypertension, asthma, COVID-19 infection with acute pulmonary embolus in February, no longer on anticoagulation, presents with concern for waking this morning with difficulty speaking.   Assessment / Plan / Recommendation Clinical Impression  Pt demonstrates significant CN VII involvement marked by minimal left facial movement during retraction. She was not consistently aware of residue on left side but masticated and cleared solid texture without difficulty. Despite impairments she contained liquid bolus in oral cavity via cup and straw. Therapist provided education re: pocketing and pt independently initiated biting/mastication on unaffected side. She coughed following thin liquid after trial of solid (cracker) without other indications of possible compromised airway. Recommend Dys 3 texture to initiate and upgrade when appropriate. Thin liquid, straws allowed, pills with thin and check for pocketing on left. ST will follow for swallow and initiate speech-language-cognitive assessment.     SLP Visit Diagnosis: Dysphagia, unspecified (R13.10)    Aspiration Risk  Mild aspiration risk    Diet Recommendation Dysphagia 3 (Mech soft);Thin liquid   Liquid Administration via: Cup;Straw Medication  Administration: Whole meds with liquid Supervision: Patient able to self feed;Intermittent supervision to cue for compensatory strategies Compensations: Lingual sweep for clearance of pocketing;Slow rate;Small sips/bites Postural Changes: Seated upright at 90 degrees    Other  Recommendations Oral Care Recommendations: Oral care BID   Follow up Recommendations Inpatient Rehab      Frequency and Duration min 2x/week  2 weeks       Prognosis Prognosis for Safe Diet Advancement: Good      Swallow Study   General HPI: 39 year old female with history of diabetes, hypertension, asthma, COVID-19 infection with acute pulmonary embolus in February, no longer on anticoagulation, presents with concern for waking this morning with difficulty speaking. Type of Study: Bedside Swallow Evaluation Previous Swallow Assessment:  (none) Diet Prior to this Study: NPO Temperature Spikes Noted: No Respiratory Status: Room air History of Recent Intubation: No Behavior/Cognition: Alert;Cooperative;Pleasant mood;Other (Comment) (teary) Oral Cavity Assessment: Within Functional Limits Oral Care Completed by SLP: No Oral Cavity - Dentition: Adequate natural dentition Vision: Functional for self-feeding Self-Feeding Abilities: Able to feed self Patient Positioning: Upright in bed Baseline Vocal Quality: Normal Volitional Cough: Weak Volitional Swallow: Able to elicit    Oral/Motor/Sensory Function Overall Oral Motor/Sensory Function: Severe impairment Facial ROM: Suspected CN VII (facial) dysfunction Facial Symmetry: Abnormal symmetry left;Suspected CN VII (facial) dysfunction Facial Strength: Suspected CN VII (facial) dysfunction;Reduced left Facial Sensation:  (pt states no sensory loss when touched) Lingual ROM: Within Functional Limits Lingual Symmetry: Abnormal symmetry left;Suspected CN XII (hypoglossal) dysfunction   Ice Chips Ice chips: Within functional limits   Thin Liquid Thin Liquid:  Impaired Presentation: Cup;Straw Pharyngeal  Phase Impairments: Cough - Delayed    Nectar Thick Nectar Thick Liquid: Not  tested   Honey Thick Honey Thick Liquid: Not tested   Puree Puree: Impaired Presentation: Self Fed;Spoon Oral Phase Functional Implications: Other (comment) (left labial residue) Pharyngeal Phase Impairments:  (none)   Solid     Solid: Within functional limits      Royce Macadamia 03/27/2020,3:13 PM  Breck Coons Lonell Face.Ed Nurse, children's [phone] Office [phone]

## 2020-03-27 NOTE — ED Notes (Signed)
Pt back from MRI 

## 2020-03-27 NOTE — ED Notes (Signed)
Dinner Tray Ordered @ 1704. 

## 2020-03-27 NOTE — H&P (View-Only) (Signed)
CARDIOLOGY CONSULT NOTE  Patient ID: Shelly Shields MRN: [id number] DOB/AGE: Oct 12, 1980 39 y.o.  Admit date: 03/27/2020 Referring Physician  Karmen Bongo, MD Primary Physician:  [name], MD Reason for Consultation  Cardiomyopathy  Patient ID: Shelly Shields, female    DOB: July 09, 1980, 39 y.o.   MRN: [id number]  Chief Complaint  Patient presents with  . Code Stroke   HPI:    Shelly Shields  is a 39 y.o. African-American female with hypertension, hyperlipidemia, uncontrolled diabetes mellitus, obesity and obstructive sleep apnea on CPAP and history of Covid pneumonia in January 2021 and also had pulmonary embolism during Covid infection.  She is also being evaluated for vasculitis.  She is now admitted to the hospital with dysarthria and left-sided weakness that she discovered when she woke up in the morning and in the ED found to have right internal capsule stroke and also found to have right renal multiple infarct, concerning for embolic phenomena.  An echocardiogram also done today revealed severe LV systolic dysfunction along with anterior hypokinesis.  Patient presently denies any chest pain or shortness of breath.  She still has mild abdominal discomfort and was emotional when I saw her.  Still has dysarthria.  Her mother is present at the bedside.  Past Medical History:  Diagnosis Date  . Asthma   . COVID-19 long hauler   . Diabetes mellitus without complication ([place])   . Hypertension   . Migraines   . Obesity   . Pulmonary embolism ([place]) 05/2019   with COVID  . Sleep apnea    Past Surgical History:  Procedure Laterality Date  . BILATERAL CARPAL TUNNEL RELEASE     Social History   Tobacco Use  . Smoking status: Never Smoker  . Smokeless tobacco: Never Used  Substance Use Topics  . Alcohol use: Yes    Alcohol/week: 0.0 standard drinks    Comment: occasional     Family History  Problem Relation Age of Onset  . Hypertension Mother   . Thyroid disease  Mother   . Hypertension Father   . Stroke Father 29  . Diabetes Father   . Sleep apnea Father   . CVA Paternal Uncle 12    Marital Sttus: Single  ROS  Review of Systems  HENT: Negative.   Cardiovascular: Negative for chest pain, dyspnea on exertion and leg swelling.  Skin: Positive for color change and rash.  Gastrointestinal: Negative.  Negative for melena.  Genitourinary: Positive for flank pain.  Neurological: Positive for disturbances in coordination and focal weakness (left arm and left leg).  All other systems reviewed and are negative.  Objective   Vitals with BMI 03/27/2020 03/27/2020 03/27/2020  Height - - -  Weight - - -  BMI - - -  Systolic 157 262 -  Diastolic 035 597 -  Pulse 100 97 105    Blood pressure (!) 136/101, pulse 100, temperature 98 F (36.7 C), temperature source Oral, resp. rate (!) 21, SpO2 98 %.    Physical Exam Cardiovascular:     Rate and Rhythm: Regular rhythm. Tachycardia present.     Pulses: Normal pulses and intact distal pulses.     Heart sounds: No murmur heard.  Gallop present. S3 sounds present.      Comments: No leg edema, no JVD. Pulmonary:     Effort: Pulmonary effort is normal.     Breath sounds: Normal breath sounds.  Abdominal:     General: Bowel sounds are normal.  Palpations: Abdomen is soft.     Tenderness: There is no abdominal tenderness. There is no guarding.    Laboratory examination:    Recent Labs    07/01/19 0420 07/01/19 0420 07/02/19 0500 07/02/19 0500 02/06/20 2118 03/27/20 0946 03/27/20 1002  NA 137   < > 138   < > 139 133* 137  K 3.3*   < > 3.5   < > 4.0 3.6 3.7  CL 101   < > 108   < > 108 104 103  CO2 23   < > 20*  --  21* 21*  --   GLUCOSE 195*   < > 153*   < > 238* 373* 363*  BUN 12   < > 12   < > 14 5* 7  CREATININE 0.77   < > 0.75   < > 1.13* 0.88 0.70  CALCIUM 9.0   < > 9.1  --  9.0 8.4*  --   GFRNONAA >60   < > >60  --  >60 >60  --   GFRAA >60  --  >60  --  >60  --   --    < > =  values in this interval not displayed.   CrCl cannot be calculated (Unknown ideal weight.).  CMP Latest Ref Rng & Units 03/27/2020 03/27/2020 02/06/2020  Glucose 70 - 99 mg/dL 363(H) 373(H) 238(H)  BUN 6 - 20 mg/dL 7 5(L) 14  Creatinine 0.44 - 1.00 mg/dL 0.70 0.88 1.13(H)  Sodium 135 - 145 mmol/L 137 133(L) 139  Potassium 3.5 - 5.1 mmol/L 3.7 3.6 4.0  Chloride 98 - 111 mmol/L 103 104 108  CO2 22 - 32 mmol/L - 21(L) 21(L)  Calcium 8.9 - 10.3 mg/dL - 8.4(L) 9.0  Total Protein 6.5 - 8.1 g/dL - 6.2(L) 7.3  Total Bilirubin 0.3 - 1.2 mg/dL - 1.0 1.3(H)  Alkaline Phos 38 - 126 U/L - 82 85  AST 15 - 41 U/L - 13(L) 18  ALT 0 - 44 U/L - 18 27   CBC Latest Ref Rng & Units 03/27/2020 03/27/2020 02/06/2020  WBC 4.0 - 10.5 K/uL - 11.7(H) 14.8(H)  Hemoglobin 12.0 - 15.0 g/dL 12.9 12.8 14.5  Hematocrit 36 - 46 % 38.0 39.3 45.5  Platelets 150 - 400 K/uL - 195 308   Lipid Panel No results for input(s): CHOL, TRIG, LDLCALC, VLDL, HDL, CHOLHDL, LDLDIRECT in the last 8760 hours.  HEMOGLOBIN A1C Lab Results  Component Value Date   HGBA1C 11.2 (H) 03/27/2020   MPG 274.74 03/27/2020   TSH No results for input(s): TSH in the last 8760 hours. BNP (last 3 results) Recent Labs    07/01/19 0420 07/02/19 0500  BNP 66.4 51.1    Medications and allergies   Allergies  Allergen Reactions  . Lisinopril Cough  . Metformin Diarrhea  . Ibuprofen Hives  . Ketorolac Hives    Current Meds  Medication Sig  . Baclofen 5 MG TABS TAKE 1 TABLET BY MOUTH THREE TIMES DAILY AS NEEDED FOR HEADACHE OR NECK PAIN  . Blood Glucose Monitoring Suppl (ACCU-CHEK NANO SMARTVIEW) W/DEVICE KIT 1 Device by Does not apply route 4 (four) times daily - after meals and at bedtime.  Marland Kitchen escitalopram (LEXAPRO) 10 MG tablet Take 10 mg by mouth daily.  . fexofenadine (ALLEGRA) 180 MG tablet Take 180 mg by mouth daily as needed.   . fluticasone (FLONASE) 50 MCG/ACT nasal spray Place 2 sprays into both nostrils daily as needed for  allergies.   . fluticasone (FLOVENT HFA) 110 MCG/ACT inhaler Inhale 1 puff into the lungs daily.   Marland Kitchen glucose blood (ACCU-CHEK SMARTVIEW) test strip Check sugar 6 x daily (Patient taking differently: Check sugar 6 x daily. Uses true test instead)  . Insulin Glargine (LANTUS) 100 UNIT/ML Solostar Pen Inject 35 Units into the skin daily. (Patient taking differently: Inject 80 Units into the skin 2 (two) times daily. )  . insulin lispro protamine-lispro (HUMALOG 75/25 MIX) (75-25) 100 UNIT/ML SUSP injection Inject 50 Units into the skin 2 (two) times daily with a meal.   . Insulin Pen Needle (INSUPEN PEN NEEDLES) 32G X 4 MM MISC BD Pen Needles- brand specific. Inject insulin via insulin pen daily  . Lancet Devices (ACCU-CHEK SOFTCLIX) lancets Use as instructed for blood glucose checks four times daily, before meals and at bedtime  . medroxyPROGESTERone (DEPO-PROVERA) 150 MG/ML injection Inject 150 mg into the muscle every 3 (three) months.  . nortriptyline (PAMELOR) 10 MG capsule Take 10 mg by mouth at bedtime.  . ondansetron (ZOFRAN) 4 MG tablet Take 1 tablet (4 mg total) by mouth every 8 (eight) hours as needed for nausea or vomiting.  . pantoprazole (PROTONIX) 40 MG tablet Take 40 mg by mouth daily.  . rosuvastatin (CRESTOR) 10 MG tablet Take 10 mg by mouth 2 (two) times a week.  . SUMAtriptan (IMITREX) 100 MG tablet Take 1 tablet (100 mg total) by mouth every 2 (two) hours as needed for migraine or headache.  . topiramate (TOPAMAX) 100 MG tablet Take 200 mg by mouth at bedtime.   . TRUEPLUS LANCETS 33G MISC USE TO CHECK BLOOD GLUCOSE QID - AFTER MEALS AND AT BEDTIME  . valsartan (DIOVAN) 160 MG tablet Take 160 mg by mouth daily.  . VENTOLIN HFA 108 (90 BASE) MCG/ACT inhaler Use as directed 2 puffs in the mouth or throat 4 (four) times daily as needed for shortness of breath.     Scheduled Meds: . aspirin  300 mg Rectal Daily   Or  . aspirin  325 mg Oral Daily  . [START ON 03/28/2020] budesonide   0.25 mg Inhalation BID  . enoxaparin (LOVENOX) injection  40 mg Subcutaneous Q24H  . [START ON 03/28/2020] escitalopram  10 mg Oral Daily  . insulin aspart  0-15 Units Subcutaneous TID WC  . insulin aspart  0-5 Units Subcutaneous QHS  . insulin glargine  80 Units Subcutaneous BID  . nortriptyline  10 mg Oral QHS  . [START ON 03/28/2020] pantoprazole  40 mg Oral Daily  . rosuvastatin  10 mg Oral Daily  . topiramate  200 mg Oral QHS   Continuous Infusions: . sodium chloride 50 mL/hr at 03/27/20 1442   PRN Meds:.acetaminophen **OR** acetaminophen (TYLENOL) oral liquid 160 mg/5 mL **OR** acetaminophen, albuterol, senna-docusate   No intake/output data recorded. No intake/output data recorded.   Radiology:   CT Code Stroke CTA Head W/WO contrast 03/27/2020 1. Negative for large vessel occlusion but positive for Right MCA M3 occlusion. CT Perfusion detects core infarct with minimally larger penumbra at the right insula/operculum corresponding to the plain CT finding.  2. Additionally, mild vessel irregularity is noted in multiple other circle-of-Willis branches, including the left A1, right P2, left M2. Although nonspecific this constellation of clinical and imaging findings might indicate accelerated branch vessel atherosclerosis. Although other large vessels appear normal, with no atherosclerosis in the neck or at the aortic arch.  MR ANGIO HEAD WO CONTRAST  03/27/2020  1. Moderate-sized acute  right MCA infarct. 2. Small subacute left parieto-occipital infarcts. 3. Right M3 branch occlusion, otherwise negative head MRA.[id number]  CT ABDOMEN PELVIS W CONTRAST  03/27/2020  1. New 18 mm hypoattenuating lesion upper pole right kidney with areas of subcapsular multifocal decreased enhancement in the lower pole right kidney. Imaging features are nonspecific and could be related to pyelonephritis and phlegmon/evolving abscess in the upper pole right kidney cannot be excluded. Alternatively, multifocal right  renal infarct could have this appearance. Correlation with urinalysis may prove helpful.  2. Relatively well-defined 2.2 cm lesion in the inferior lingula measures water density. This may be loculated pleural fluid or intraparenchymal fluid collection in this patient with a history of pulmonary embolus and bilateral ground-glass airspace disease on the previous study suspicious for multifocal atypical/viral pneumonia. Consider follow-up to ensure resolution.  Cardiac Studies:   ECHOCARDIOGRAM COMPLETE BUBBLE STUDY  03/27/2020 1. Left ventricular ejection fraction, by estimation, is 20 to 25%. The left ventricle has severely decreased function. There is severe hypokinesis of the basal-to-apical anteroseptum, inferoseptum and anterior LV walls . Left ventricular diastolic  parameters are consistent with Grade II diastolic dysfunction (pseudonormalization).  2. Right ventricular systolic function is normal. The right ventricular size is normal.  3. Left atrial size was moderately dilated.  4. The mitral valve is normal in structure. Trivial mitral valve regurgitation.  5. The aortic valve is tricuspid. There is mild thickening of the aortic valve. Aortic valve regurgitation is not visualized.  6. The inferior vena cava is normal in size with greater than 50% respiratory variability, suggesting right atrial pressure of 3 mmHg.  7. Agitated saline contrast bubble study was negative, with no evidence of any interatrial shunt.  Comparison(s): Compared to prior echo in 06/2019, the LVEF is now severely reduced to 20-25% with severe hypokinesis of the inferoseptal, anteroseptal and anterior LV walls.  EKG:  EKG 03/27/2020: Sinus tachycardia at rate of 102 bpm, normal axis, borderline criteria for LVH.  Borderline QT elongation.  Compared to 06/30/2019, sinus tachycardia new.  No change in QT elongation.  Assessment  Shelly Shields  is a  39 y.o. African-American female with hypertension, hyperlipidemia,  uncontrolled diabetes mellitus, obesity and obstructive sleep apnea on CPAP and history of Covid pneumonia in January 2021 and also had pulmonary embolism during Covid infection.  She is also being evaluated for vasculitis. 1. Acute embolic CVA with left hemiparesis.  2.  Cardiomyopathy with severe LV systolic dysfunction, new finding since February 2021 echocardiogram.  Wall motion abnormality suggests either ischemic etiology versus nonischemic stress cardiomyopathy related to acute stroke. 3.  Hypertension 4.  Diabetes mellitus type 2 uncontrolled with hyperglycemia without complications. 5.  Hyperlipidemia 6.  Moderate obesity with obstructive sleep apnea on CPAP  Recommendations:   Patient will need TEE to better define the etiology for her embolic CVA.  With multiple strokes in the brain and also suspicion for right renal infarct, cardiac etiology appears to be most likely.  She does have risk factors for atrial fibrillation including obesity, OSA, hypertension as well and there is moderate left atrial enlargement.  Negative for PFO.  Mural thrombus also need to be excluded in view of wall motion abnormality.  I would have a very low threshold to start anticoagulation if neurology team agrees.  I will try to set up the TEE tomorrow if she is stable.  With regard to cardiomyopathy, she will eventually need ischemic work-up in view of underlying cardiac risk factors including diabetes, hypertension and hyperlipidemia.  I will not start any therapy for heart failure right now she is well compensated without clinical evidence of heart failure, BNP is also normal.  I will let primary team and neurology to manage her blood pressure as per the recommendations.  Continue statins, continue CPAP for OSA and telemetry for monitoring of arrhythmias.  This was a 60-minute encounter, I have personally reviewed her echocardiogram, EKGs and also her labs.  Patient's mother is at the bedside and all questions  answered.   Adrian Prows, MD, [facility] 03/27/2020, 6:57 PM Office: [phone]

## 2020-03-27 NOTE — ED Provider Notes (Signed)
[facility] EMERGENCY DEPARTMENT Provider Note   CSN: [id number] Arrival date & time: 03/27/20  7062  An emergency department physician performed an initial assessment on this suspected stroke patient at 0920.  History Chief Complaint  Patient presents with  . Code Stroke    Shelly Shields is a 39 y.o. female.  HPI      39 year old female with history of diabetes, hypertension, asthma, COVID-19 infection with acute pulmonary embolus in February, no longer on anticoagulation, presents with concern for waking this morning with difficulty speaking.  Her mother last talked to her at 8 PM last night and reports she sounded normal.  Patient did not talk to anyone after 8 PM and went to bed around midnight, and woke up at 6 AM today with difficulty speaking.  Acknowledges slurred speech and difficulty finding words. Denies numbness/weakness but noted to have left sided weakness on exam. She is right handed.  Yesterday, began to have flank pain on right, thought she was developing kidney infection, noted n. Diarrhea this AM. No trauma, no prior weakness/numbness to legs.  Glucose has been high.   Past Medical History:  Diagnosis Date  . Asthma   . Diabetes mellitus without complication ([place])   . Hypertension   . Migraines   . Obesity   . Sleep apnea     Patient Active Problem List   Diagnosis Date Noted  . Acute CVA (cerebrovascular accident) ([place]) 03/27/2020  . Acute pulmonary embolism ([place]) 06/30/2019  . Hypernatremia   . Asthma, chronic   . DKA (diabetic ketoacidoses) 11/27/2014  . Diabetes mellitus, new onset ([place])   . Hyperglycemic hyperosmolar nonketotic coma ([place])   . Essential hypertension   . Asthma     Past Surgical History:  Procedure Laterality Date  . BILATERAL CARPAL TUNNEL RELEASE       OB History   No obstetric history on file.     Family History  Problem Relation Age of Onset  . Hypertension Mother   . Thyroid disease Mother     . Hypertension Father   . Stroke Father   . Diabetes Father   . Sleep apnea Father     Social History   Tobacco Use  . Smoking status: Never Smoker  . Smokeless tobacco: Never Used  Vaping Use  . Vaping Use: Never used  Substance Use Topics  . Alcohol use: Yes    Alcohol/week: 0.0 standard drinks    Comment: occasional   . Drug use: No    Home Medications Prior to Admission medications   Medication Sig Start Date End Date Taking? Authorizing Provider  apixaban (ELIQUIS) 5 MG TABS tablet Take 2 tablets (10 mg total) by mouth 2 (two) times daily for 5 days. 07/03/19 07/08/19  Thurnell Lose, MD  apixaban (ELIQUIS) 5 MG TABS tablet Take 1 tablet (5 mg total) by mouth 2 (two) times daily. 07/09/19   Thurnell Lose, MD  Blood Glucose Monitoring Suppl (ACCU-CHEK NANO SMARTVIEW) W/DEVICE KIT 1 Device by Does not apply route 4 (four) times daily - after meals and at bedtime. 11/29/14   Frazier Richards, MD  Cyanocobalamin 1000 MCG LOZG Take 2,000 mcg by mouth at bedtime.     [provider]  fexofenadine (ALLEGRA) 180 MG tablet Take 180 mg by mouth daily as needed.     [provider]  fluticasone (FLONASE) 50 MCG/ACT nasal spray Place 2 sprays into both nostrils daily as needed for allergies.  09/22/14  [provider]  glucose blood (ACCU-CHEK SMARTVIEW) test strip Check sugar 6 x daily Patient taking differently: Check sugar 6 x daily. Uses true test instead 11/29/14   Frazier Richards, MD  guaiFENesin-dextromethorphan [facility] DM) 100-10 MG/5ML syrup Take 5 mLs by mouth every 6 (six) hours as needed for cough. 07/03/19   Thurnell Lose, MD  HYDROcodone-homatropine [facility]) 5-1.5 MG/5ML syrup Take 5 mLs by mouth every 6 (six) hours as needed for cough. 12/15/19   Faustino Congress, NP  Insulin Glargine (LANTUS) 100 UNIT/ML Solostar Pen Inject 35 Units into the skin daily. Patient taking differently: Inject 80 Units into the skin 2 (two) times daily.  11/29/14    Frazier Richards, MD  insulin lispro protamine-lispro (HUMALOG 75/25 MIX) (75-25) 100 UNIT/ML SUSP injection Inject 50 Units into the skin 2 (two) times daily with a meal.     [provider]  Insulin Pen Needle (INSUPEN PEN NEEDLES) 32G X 4 MM MISC BD Pen Needles- brand specific. Inject insulin via insulin pen daily 11/29/14   Frazier Richards, MD  Lancet Devices [facility]) lancets Use as instructed for blood glucose checks four times daily, before meals and at bedtime 11/29/14   Frazier Richards, MD  omeprazole (PRILOSEC) 40 MG capsule Take 40 mg by mouth daily.    [provider]  ondansetron (ZOFRAN) 4 MG tablet Take 1 tablet (4 mg total) by mouth every 8 (eight) hours as needed for nausea or vomiting. 07/03/19   Thurnell Lose, MD  rosuvastatin (CRESTOR) 10 MG tablet Take 10 mg by mouth 2 (two) times a week.    [provider]  SUMAtriptan (IMITREX) 100 MG tablet Take 1 tablet (100 mg total) by mouth every 2 (two) hours as needed for migraine or headache. 11/29/14   Frazier Richards, MD  topiramate (TOPAMAX) 100 MG tablet Take 200 mg by mouth at bedtime.     [provider]  TRUEPLUS LANCETS 33G MISC USE TO CHECK BLOOD GLUCOSE QID - AFTER MEALS AND AT BEDTIME 12/22/14   [provider]  valsartan (DIOVAN) 160 MG tablet Take 160 mg by mouth daily.    [provider]  VENTOLIN HFA 108 (90 BASE) MCG/ACT inhaler Use as directed 2 puffs in the mouth or throat 4 (four) times daily as needed. 12/21/14   [provider]    Allergies    Ibuprofen and Ketorolac  Review of Systems   Review of Systems  Constitutional: Negative for fever.  HENT: Negative for sore throat.   Eyes: Negative for visual disturbance.  Respiratory: Negative for cough and shortness of breath.   Cardiovascular: Negative for chest pain.  Gastrointestinal: Positive for nausea and vomiting. Negative for abdominal pain.  Genitourinary: Positive for flank pain. Negative  for difficulty urinating.  Musculoskeletal: Negative for back pain and neck pain.  Skin: Negative for rash.  Neurological: Positive for speech difficulty and weakness. Negative for syncope, numbness and headaches.    Physical Exam Updated Vital Signs BP (!) 147/97   Pulse 99   Temp 98 F (36.7 C) (Oral)   Resp 13   SpO2 98%   Physical Exam Vitals and nursing note reviewed.  Constitutional:      General: She is not in acute distress.    Appearance: She is well-developed. She is not diaphoretic.  HENT:     Head: Normocephalic and atraumatic.  Eyes:     Conjunctiva/sclera: Conjunctivae normal.  Cardiovascular:     Rate  and Rhythm: Normal rate and regular rhythm.     Heart sounds: Normal heart sounds. No murmur heard.  No friction rub. No gallop.   Pulmonary:     Effort: Pulmonary effort is normal. No respiratory distress.     Breath sounds: Normal breath sounds. No wheezing or rales.  Abdominal:     General: There is no distension.     Palpations: Abdomen is soft.     Tenderness: There is abdominal tenderness (diffuse, worse right L). There is no guarding.  Musculoskeletal:        General: Tenderness (right lower back) present.     Cervical back: Normal range of motion.  Skin:    General: Skin is warm and dry.     Findings: No erythema or rash.  Neurological:     Mental Status: She is alert and oriented to person, place, and time.     Comments: Pronation on left UE, left arm weakness, mild LLE weakness, facial droop with smile     ED Results / Procedures / Treatments   Labs (all labs ordered are listed, but only abnormal results are displayed) Labs Reviewed  APTT - Abnormal; Notable for the following components:      Result Value   aPTT 22 (*)    All other components within normal limits  CBC - Abnormal; Notable for the following components:   WBC 11.7 (*)    All other components within normal limits  DIFFERENTIAL - Abnormal; Notable for the following  components:   Neutro Abs 9.7 (*)    All other components within normal limits  COMPREHENSIVE METABOLIC PANEL - Abnormal; Notable for the following components:   Sodium 133 (*)    CO2 21 (*)    Glucose, Bld 373 (*)    BUN 5 (*)    Calcium 8.4 (*)    Total Protein 6.2 (*)    Albumin 3.1 (*)    AST 13 (*)    All other components within normal limits  URINALYSIS, ROUTINE W REFLEX MICROSCOPIC - Abnormal; Notable for the following components:   Color, Urine ORANGE (*)    Glucose, UA   (*)    Value: TEST NOT REPORTED DUE TO COLOR INTERFERENCE OF URINE PIGMENT   Hgb urine dipstick   (*)    Value: TEST NOT REPORTED DUE TO COLOR INTERFERENCE OF URINE PIGMENT   Bilirubin Urine   (*)    Value: TEST NOT REPORTED DUE TO COLOR INTERFERENCE OF URINE PIGMENT   Ketones, ur   (*)    Value: TEST NOT REPORTED DUE TO COLOR INTERFERENCE OF URINE PIGMENT   Protein, ur   (*)    Value: TEST NOT REPORTED DUE TO COLOR INTERFERENCE OF URINE PIGMENT   Nitrite   (*)    Value: TEST NOT REPORTED DUE TO COLOR INTERFERENCE OF URINE PIGMENT   Leukocytes,Ua   (*)    Value: TEST NOT REPORTED DUE TO COLOR INTERFERENCE OF URINE PIGMENT   All other components within normal limits  BETA-HYDROXYBUTYRIC ACID - Abnormal; Notable for the following components:   Beta-Hydroxybutyric Acid 0.67 (*)    All other components within normal limits  URINALYSIS, MICROSCOPIC (REFLEX) - Abnormal; Notable for the following components:   Bacteria, UA RARE (*)    All other components within normal limits  CBG MONITORING, ED - Abnormal; Notable for the following components:   Glucose-Capillary 346 (*)    All other components within normal limits  I-STAT CHEM 8, ED -  Abnormal; Notable for the following components:   Glucose, Bld 363 (*)    TCO2 20 (*)    All other components within normal limits  RESPIRATORY PANEL BY RT PCR (FLU A&B, COVID)  URINE CULTURE  ETHANOL  PROTIME-INR  RAPID URINE DRUG SCREEN, HOSP PERFORMED  I-STAT BETA  HCG BLOOD, ED ([facility], [facility], [facility] ONLY)    EKG EKG Interpretation  Date/Time:  Monday March 27 2020 09:12:54 EDT Ventricular Rate:  102 PR Interval:    QRS Duration: 80 QT Interval:  383 QTC Calculation: 499 R Axis:     Text Interpretation: Sinus tachycardia Consider left ventricular hypertrophy Borderline prolonged QT interval No significant change since last tracing Confirmed by Gareth Morgan [phone]) on 03/27/2020 10:16:41 AM   Radiology CT Code Stroke CTA Head W/WO contrast  Result Date: 03/27/2020 CLINICAL DATA:  39 year old female code stroke presentation. Slurred speech. Plain head CT suspicious for right MCA infarct ASPECTS 8. History of diabetes, hypertension. Family history of stroke. Status post COVID-19 and PE in February. EXAM: CT ANGIOGRAPHY HEAD AND NECK CT PERFUSION BRAIN TECHNIQUE: Multidetector CT imaging of the head and neck was performed using the standard protocol during bolus administration of intravenous contrast. Multiplanar CT image reconstructions and MIPs were obtained to evaluate the vascular anatomy. Carotid stenosis measurements (when applicable) are obtained utilizing NASCET criteria, using the distal internal carotid diameter as the denominator. Multiphase CT imaging of the brain was performed following IV bolus contrast injection. Subsequent parametric perfusion maps were calculated using RAPID software. CONTRAST:  117m OMNIPAQUE IOHEXOL 350 MG/ML SOLN COMPARISON:  plain head CT  0929 hours today. FINDINGS: CT Brain Perfusion Findings: ASPECTS: 8 CBF (<30%) Volume: 153mPerfusion (Tmax>6.0s) volume: 2238m Hypoperfusion index 0.5. Mismatch Volume: 6mL71mfarction Location:Right insula, operculum, largely corresponding to the plain CT findings. CTA NECK Skeleton: No acute osseous abnormality identified. Upper chest: Negative aside from mild motion artifact. Other neck: Negative. Aortic arch: 3 vessel arch configuration.  No arch atherosclerosis. Right carotid system:  Negative aside from a partially retropharyngeal course. Left carotid system: Negative. Vertebral arteries: Detail of the proximal right subclavian artery and right vertebral origin partially obscured by dense right subclavian venous contrast. The right V1 segment appears normal. The right vertebral is patent to the skull base with no plaque or stenosis identified. Normal proximal left subclavian artery and left vertebral artery origin. Left vertebral artery is fairly codominant and within normal limits to the skull base. CTA HEAD Posterior circulation: Codominant distal vertebral arteries are within normal limits. Normal PICA origins and vertebrobasilar junction. Patent basilar artery without stenosis. AICA, SCA and PCA origins are within normal limits. Posterior communicating arteries are diminutive or absent. There is mild irregularity of the right PCA P2 segment (series 2, image 23. No significant stenosis. Otherwise bilateral PCA branches are within normal limits. Anterior circulation: Both ICA siphons are patent with no plaque or stenosis identified. Patent carotid termini. Normal MCA and ACA origins. Mildly dominant right A1. mild irregularity of the left A1 (series 10, image 22). Anterior communicating artery within normal limits. Other bilateral PCA branches are tortuous but within normal limits. Left MCA M1 segment and bifurcation are patent without stenosis. But thick MIP images on series 12 suggest mild irregularity of left MCA M2 and M3 branches. Right MCA M1 segment and right MCA trifurcation are patent without stenosis. No right MCA M2 branch occlusion or irregularity is identified. But a right M3 branch occlusion in the middle division is identified on series 12,  image 14 and series 7 images 88 and 87. Other right MCA branches appear within normal limits. Venous sinuses: Early contrast timing, not evaluated. Anatomic variants: Mildly dominant right A1. Review of the MIP images confirms the above  findings IMPRESSION: 1. Negative for large vessel occlusion but positive for Right MCA M3 occlusion. CT Perfusion detects core infarct with minimally larger penumbra at the right insula/operculum corresponding to the plain CT finding. Preliminary report of the above These results were communicated to Dr. Curly Shores at 1008 hours on 03/27/2020 by text page via the [facility] messaging system. 2. Additionally, mild vessel irregularity is noted in multiple other circle-of-Willis branches, including the left A1, right P2, left M2. Although nonspecific this constellation of clinical and imaging findings might indicate accelerated branch vessel atherosclerosis. Although other large vessels appear normal, with no atherosclerosis in the neck or at the aortic arch. Electronically Signed   By: Genevie Ann M.D.   On: 03/27/2020 10:24   CT Code Stroke CTA Neck W/WO contrast  Result Date: 03/27/2020 CLINICAL DATA:  39 year old female code stroke presentation. Slurred speech. Plain head CT suspicious for right MCA infarct ASPECTS 8. History of diabetes, hypertension. Family history of stroke. Status post COVID-19 and PE in February. EXAM: CT ANGIOGRAPHY HEAD AND NECK CT PERFUSION BRAIN TECHNIQUE: Multidetector CT imaging of the head and neck was performed using the standard protocol during bolus administration of intravenous contrast. Multiplanar CT image reconstructions and MIPs were obtained to evaluate the vascular anatomy. Carotid stenosis measurements (when applicable) are obtained utilizing NASCET criteria, using the distal internal carotid diameter as the denominator. Multiphase CT imaging of the brain was performed following IV bolus contrast injection. Subsequent parametric perfusion maps were calculated using RAPID software. CONTRAST:  169m OMNIPAQUE IOHEXOL 350 MG/ML SOLN COMPARISON:  plain head CT  0929 hours today. FINDINGS: CT Brain Perfusion Findings: ASPECTS: 8 CBF (<30%) Volume: 172mPerfusion (Tmax>6.0s) volume: 2233m  Hypoperfusion index 0.5. Mismatch Volume: 6mL28mfarction Location:Right insula, operculum, largely corresponding to the plain CT findings. CTA NECK Skeleton: No acute osseous abnormality identified. Upper chest: Negative aside from mild motion artifact. Other neck: Negative. Aortic arch: 3 vessel arch configuration.  No arch atherosclerosis. Right carotid system: Negative aside from a partially retropharyngeal course. Left carotid system: Negative. Vertebral arteries: Detail of the proximal right subclavian artery and right vertebral origin partially obscured by dense right subclavian venous contrast. The right V1 segment appears normal. The right vertebral is patent to the skull base with no plaque or stenosis identified. Normal proximal left subclavian artery and left vertebral artery origin. Left vertebral artery is fairly codominant and within normal limits to the skull base. CTA HEAD Posterior circulation: Codominant distal vertebral arteries are within normal limits. Normal PICA origins and vertebrobasilar junction. Patent basilar artery without stenosis. AICA, SCA and PCA origins are within normal limits. Posterior communicating arteries are diminutive or absent. There is mild irregularity of the right PCA P2 segment (series 2, image 23. No significant stenosis. Otherwise bilateral PCA branches are within normal limits. Anterior circulation: Both ICA siphons are patent with no plaque or stenosis identified. Patent carotid termini. Normal MCA and ACA origins. Mildly dominant right A1. mild irregularity of the left A1 (series 10, image 22). Anterior communicating artery within normal limits. Other bilateral PCA branches are tortuous but within normal limits. Left MCA M1 segment and bifurcation are patent without stenosis. But thick MIP images on series 12 suggest mild irregularity of left MCA M2 and M3 branches. Right MCA  M1 segment and right MCA trifurcation are patent without stenosis. No right MCA M2 branch  occlusion or irregularity is identified. But a right M3 branch occlusion in the middle division is identified on series 12, image 14 and series 7 images 88 and 87. Other right MCA branches appear within normal limits. Venous sinuses: Early contrast timing, not evaluated. Anatomic variants: Mildly dominant right A1. Review of the MIP images confirms the above findings IMPRESSION: 1. Negative for large vessel occlusion but positive for Right MCA M3 occlusion. CT Perfusion detects core infarct with minimally larger penumbra at the right insula/operculum corresponding to the plain CT finding. Preliminary report of the above These results were communicated to Dr. Curly Shores at 1008 hours on 03/27/2020 by text page via the [facility] messaging system. 2. Additionally, mild vessel irregularity is noted in multiple other circle-of-Willis branches, including the left A1, right P2, left M2. Although nonspecific this constellation of clinical and imaging findings might indicate accelerated branch vessel atherosclerosis. Although other large vessels appear normal, with no atherosclerosis in the neck or at the aortic arch. Electronically Signed   By: Genevie Ann M.D.   On: 03/27/2020 10:24   CT ABDOMEN PELVIS W CONTRAST  Result Date: 03/27/2020 CLINICAL DATA:  Flank pain. EXAM: CT ABDOMEN AND PELVIS WITH CONTRAST TECHNIQUE: Multidetector CT imaging of the abdomen and pelvis was performed using the standard protocol following bolus administration of intravenous contrast. CONTRAST:  44m OMNIPAQUE IOHEXOL 300 MG/ML  SOLN COMPARISON:  CT pelvis 06/30/2019.  CT abdomen/pelvis 06/22/2019. FINDINGS: Lower chest: Relatively well-defined 2.2 cm lesion in the inferior lingula (image 4/series 5) measures water density. This may be loculated pleural fluid or intraparenchymal fluid collection in this patient with a history of pulmonary embolus and bilateral ground-glass airspace disease on the previous study suspicious for multifocal atypical/viral  pneumonia. Heart size upper normal. Hepatobiliary: No suspicious focal abnormality within the liver parenchyma. There is no evidence for gallstones, gallbladder wall thickening, or pericholecystic fluid. No intrahepatic or extrahepatic biliary dilation. Pancreas: No focal mass lesion. No dilatation of the main duct. No intraparenchymal cyst. No peripancreatic edema. Spleen: No splenomegaly. No focal mass lesion. Adrenals/Urinary Tract: No adrenal nodule or mass. New 18 mm hypoattenuating lesion identified upper pole right kidney with areas of subcapsular multifocal decreased enhancement in the lower pole right kidney. Left kidney unremarkable. No evidence for hydroureter. The urinary bladder appears normal for the degree of distention. Stomach/Bowel: Stomach is unremarkable. No gastric wall thickening. No evidence of outlet obstruction. Duodenum is normally positioned as is the ligament of Treitz. No small bowel wall thickening. No small bowel dilatation. The terminal ileum is normal. The appendix is normal. No gross colonic mass. No colonic wall thickening. Vascular/Lymphatic: No abdominal aortic aneurysm. No abdominal aortic atherosclerotic calcification. There is no gastrohepatic or hepatoduodenal ligament lymphadenopathy. No retroperitoneal or mesenteric lymphadenopathy. No pelvic sidewall lymphadenopathy. Reproductive: The uterus is unremarkable.  There is no adnexal mass. Other: No intraperitoneal free fluid. Musculoskeletal: No worrisome lytic or sclerotic osseous abnormality. IMPRESSION: 1. New 18 mm hypoattenuating lesion upper pole right kidney with areas of subcapsular multifocal decreased enhancement in the lower pole right kidney. Imaging features are nonspecific and could be related to pyelonephritis and phlegmon/evolving abscess in the upper pole right kidney cannot be excluded. Alternatively, multifocal right renal infarct could have this appearance. Correlation with urinalysis may prove helpful. 2.  Relatively well-defined 2.2 cm lesion in the inferior lingula measures water density. This may be loculated pleural fluid or intraparenchymal fluid collection  in this patient with a history of pulmonary embolus and bilateral ground-glass airspace disease on the previous study suspicious for multifocal atypical/viral pneumonia. Consider follow-up to ensure resolution. Electronically Signed   By: Misty Stanley M.D.   On: 03/27/2020 12:03   CT Code Stroke Cerebral Perfusion with contrast  Result Date: 03/27/2020 CLINICAL DATA:  39 year old female code stroke presentation. Slurred speech. Plain head CT suspicious for right MCA infarct ASPECTS 8. History of diabetes, hypertension. Family history of stroke. Status post COVID-19 and PE in February. EXAM: CT ANGIOGRAPHY HEAD AND NECK CT PERFUSION BRAIN TECHNIQUE: Multidetector CT imaging of the head and neck was performed using the standard protocol during bolus administration of intravenous contrast. Multiplanar CT image reconstructions and MIPs were obtained to evaluate the vascular anatomy. Carotid stenosis measurements (when applicable) are obtained utilizing NASCET criteria, using the distal internal carotid diameter as the denominator. Multiphase CT imaging of the brain was performed following IV bolus contrast injection. Subsequent parametric perfusion maps were calculated using RAPID software. CONTRAST:  137m OMNIPAQUE IOHEXOL 350 MG/ML SOLN COMPARISON:  plain head CT  0929 hours today. FINDINGS: CT Brain Perfusion Findings: ASPECTS: 8 CBF (<30%) Volume: 136mPerfusion (Tmax>6.0s) volume: 2277m Hypoperfusion index 0.5. Mismatch Volume: 6mL67mfarction Location:Right insula, operculum, largely corresponding to the plain CT findings. CTA NECK Skeleton: No acute osseous abnormality identified. Upper chest: Negative aside from mild motion artifact. Other neck: Negative. Aortic arch: 3 vessel arch configuration.  No arch atherosclerosis. Right carotid system:  Negative aside from a partially retropharyngeal course. Left carotid system: Negative. Vertebral arteries: Detail of the proximal right subclavian artery and right vertebral origin partially obscured by dense right subclavian venous contrast. The right V1 segment appears normal. The right vertebral is patent to the skull base with no plaque or stenosis identified. Normal proximal left subclavian artery and left vertebral artery origin. Left vertebral artery is fairly codominant and within normal limits to the skull base. CTA HEAD Posterior circulation: Codominant distal vertebral arteries are within normal limits. Normal PICA origins and vertebrobasilar junction. Patent basilar artery without stenosis. AICA, SCA and PCA origins are within normal limits. Posterior communicating arteries are diminutive or absent. There is mild irregularity of the right PCA P2 segment (series 2, image 23. No significant stenosis. Otherwise bilateral PCA branches are within normal limits. Anterior circulation: Both ICA siphons are patent with no plaque or stenosis identified. Patent carotid termini. Normal MCA and ACA origins. Mildly dominant right A1. mild irregularity of the left A1 (series 10, image 22). Anterior communicating artery within normal limits. Other bilateral PCA branches are tortuous but within normal limits. Left MCA M1 segment and bifurcation are patent without stenosis. But thick MIP images on series 12 suggest mild irregularity of left MCA M2 and M3 branches. Right MCA M1 segment and right MCA trifurcation are patent without stenosis. No right MCA M2 branch occlusion or irregularity is identified. But a right M3 branch occlusion in the middle division is identified on series 12, image 14 and series 7 images 88 and 87. Other right MCA branches appear within normal limits. Venous sinuses: Early contrast timing, not evaluated. Anatomic variants: Mildly dominant right A1. Review of the MIP images confirms the above  findings IMPRESSION: 1. Negative for large vessel occlusion but positive for Right MCA M3 occlusion. CT Perfusion detects core infarct with minimally larger penumbra at the right insula/operculum corresponding to the plain CT finding. Preliminary report of the above These results were communicated to Dr. BhagCurly [phone]  hours on 03/27/2020 by text page via the [facility] messaging system. 2. Additionally, mild vessel irregularity is noted in multiple other circle-of-Willis branches, including the left A1, right P2, left M2. Although nonspecific this constellation of clinical and imaging findings might indicate accelerated branch vessel atherosclerosis. Although other large vessels appear normal, with no atherosclerosis in the neck or at the aortic arch. Electronically Signed   By: Genevie Ann M.D.   On: 03/27/2020 10:24   CT HEAD CODE STROKE WO CONTRAST  Result Date: 03/27/2020 CLINICAL DATA:  Code stroke.  Slurred speech. EXAM: CT HEAD WITHOUT CONTRAST TECHNIQUE: Contiguous axial images were obtained from the base of the skull through the vertex without intravenous contrast. COMPARISON:  None. FINDINGS: Brain: There is loss of gray-white differentiation and subtle hypoattenuation involving the right insula and the right posterior frontal cortex and underlying white matter. Mild sulcal effacement without substantial mass effect. No midline shift. Basal cisterns are patent. No acute hemorrhage. No hydrocephalus. No mass lesion. Vascular: No definite hyperdense vessel identified. Skull: No acute fracture. Sinuses/Orbits: No acute finding. Other: No mastoid effusions. ASPECTS [facility] Stroke Program Early CT Score) - Ganglionic level infarction (caudate, lentiform nuclei, internal capsule, insula, M1-M3 cortex): 6 - Supraganglionic infarction (M4-M6 cortex): 2 Total score (0-10 with 10 being normal): 8 IMPRESSION: 1. Findings concerning for acute or early subacute infarct involving the right insula and posterior frontal lobe  (MCA territory). ASPECTS is 8. 2. No substantial mass effect or acute hemorrhage. Code stroke imaging results were communicated on 03/27/2020 at 9:38 am to provider Dr. Curly Shores Via telephone, who verbally acknowledged these results. Electronically Signed   By: Margaretha Sheffield MD   On: 03/27/2020 09:44    Procedures .Critical Care Performed by: Gareth Morgan, MD Authorized by: Gareth Morgan, MD   Critical care provider statement:    Critical care time (minutes):  75   Critical care was time spent personally by me on the following activities:  Discussions with consultants, evaluation of patient's response to treatment, examination of patient, ordering and performing treatments and interventions, ordering and review of laboratory studies, ordering and review of radiographic studies, pulse oximetry, re-evaluation of patient's condition, obtaining history from patient or surrogate and review of old charts   (including critical care time)  Medications Ordered in ED Medications  perflutren lipid microspheres (DEFINITY) IV suspension (has no administration in time range)  iohexol (OMNIPAQUE) 350 MG/ML injection 100 mL (100 mLs Intravenous Contrast Given 03/27/20 1001)  fentaNYL (SUBLIMAZE) injection 50 mcg (50 mcg Intravenous Given 03/27/20 1034)  sodium chloride 0.9 % bolus 1,000 mL (0 mLs Intravenous Stopped 03/27/20 1213)  ondansetron (ZOFRAN) injection 4 mg (4 mg Intravenous Given 03/27/20 1034)  iohexol (OMNIPAQUE) 300 MG/ML solution 60 mL (60 mLs Intravenous Contrast Given 03/27/20 1152)    ED Course  I have reviewed the triage vital signs and the nursing notes.  Pertinent labs & imaging results that were available during my care of the patient were reviewed by me and considered in my medical decision making (see chart for details).    MDM Rules/Calculators/A&P                          39 year old female with history of diabetes, hypertension, asthma, COVID-19 infection with acute  pulmonary embolus in February, no longer on anticoagulation, presents with concern for waking this morning with difficulty speaking.  DDx includes hyperglycemic hyperosmolar nonketotic coma, DKA, hyperglycemia, CVA, ICH, other metabolic or electrolyte abnormality.  Given within 24hr and VAN positive with aphasia and weakness called Code Stroke.  Neurology Dr. Curly Shores came to bedside to evaluate patient. CT shows right insular and posterior frontal lobe infarct. Not an intervention candidate per Neurology. M3 occlusion on CTA.  Plan on supportive care, admission for CVA work up.  In addition to stroke symptoms, she has right sided flank pain.  DDx includes pyelonephritis, renal infarct, MSK pain. Doubt dissection as etiology of neuro symptoms and flank pain as no sign of dissection at the arch on CTA neck.  CT abdomen pelvis w contrast ordered shows finding which may be consistent with pyelo/abscess vs multiple infarcts--clinically feel infarcts more likely given UA without leukocytes, few bacteria, pt afebrile and timing of right flank pain near the time of CVA.  Admitted to Dr. Lorin Mercy for further care.    Final Clinical Impression(s) / ED Diagnoses Final diagnoses:  Cerebrovascular accident (CVA) due to embolism of right middle cerebral artery ([place])  Right flank pain    Rx / DC Orders ED Discharge Orders    None       Gareth Morgan, MD 03/27/20 1336

## 2020-03-27 NOTE — H&P (Signed)
History and Physical    Shelly Shields WRU:[id number] DOB: 21-Apr-1981 DOA: 03/27/2020  PCP: [name], MD Consultants:  [place] [place] - pulmonology; [place], [place] - neurology Patient coming from: Home - lives with cousins; [place]: Mother, [phone]  Chief Complaint: Dysarthria  HPI: Shelly Shields is a 39 y.o. female with medical history significant of obesity; OSA on CPAP; HTN; DM; asthma; and COVID with PE, off AC since June, presenting with dysarthria.  Dysarthria that started this AM.  She was having R flank pain yesterday.  She had urinary frequency as well.  No dysuria, hesitation.  She awoke this AM with dysarthria and difficulty dropping things.  She also had nausea and dizziness.    She had COVID in January and has had SOB since then - she uses a cane when she feels SOB.  They discussed pulmonary rehab but does not appear to have started it; she was due to see the doctor this week.     ED Course:  Possible CVA, R renal infarct.  LKW 8pm yesterday, here with L-sided weakness, dysarthria.  R internal capsule stroke on CT, not appropriate for intervention per neurology.  Also R flank pain and nausea - CT with pyelo vs. Multiple infarcts and UA unremarkable.  Concern for embolic phenomenon.  Review of Systems: As per HPI; otherwise review of systems reviewed and negative.   Ambulatory Status:  Ambulates without assistance  COVID Vaccine Status:  Complete  Past Medical History:  Diagnosis Date  . Asthma   . COVID-19 long hauler   . Diabetes mellitus without complication ([place])   . Hypertension   . Migraines   . Obesity   . Pulmonary embolism ([place]) 05/2019   with COVID  . Sleep apnea     Past Surgical History:  Procedure Laterality Date  . BILATERAL CARPAL TUNNEL RELEASE      Social History   Socioeconomic History  . Marital status: Single    Spouse name: Not on file  . Number of children: Not on file  . Years of education: Not on file  . Highest education level: Not  on file  Occupational History  . Occupation: Occupational psychologist  Tobacco Use  . Smoking status: Never Smoker  . Smokeless tobacco: Never Used  Vaping Use  . Vaping Use: Never used  Substance and Sexual Activity  . Alcohol use: Yes    Alcohol/week: 0.0 standard drinks    Comment: occasional   . Drug use: No  . Sexual activity: Not on file  Other Topics Concern  . Not on file  Social History Narrative   Lives in [place]: School and working   Social Determinants of Radio broadcast assistant Strain:   . Difficulty of Paying Living Expenses: Not on file  Food Insecurity:   . Worried About Charity fundraiser in the Last Year: Not on file  . Ran Out of Food in the Last Year: Not on file  Transportation Needs:   . Lack of Transportation (Medical): Not on file  . Lack of Transportation (Non-Medical): Not on file  Physical Activity:   . Days of Exercise per Week: Not on file  . Minutes of Exercise per Session: Not on file  Stress:   . Feeling of Stress : Not on file  Social Connections:   . Frequency of Communication with Friends and Family: Not on file  . Frequency of Social Gatherings with Friends and Family: Not on file  .  Attends Religious Services: Not on file  . Active Member of Clubs or Organizations: Not on file  . Attends Archivist Meetings: Not on file  . Marital Status: Not on file  Intimate Partner Violence:   . Fear of Current or Ex-Partner: Not on file  . Emotionally Abused: Not on file  . Physically Abused: Not on file  . Sexually Abused: Not on file    Allergies  Allergen Reactions  . Lisinopril Cough  . Metformin Diarrhea  . Ibuprofen Hives  . Ketorolac Hives    Family History  Problem Relation Age of Onset  . Hypertension Mother   . Thyroid disease Mother   . Hypertension Father   . Stroke Father 63  . Diabetes Father   . Sleep apnea Father   . CVA Paternal Uncle 86    Prior to Admission medications   Medication Sig  Start Date End Date Taking? Authorizing Provider  apixaban (ELIQUIS) 5 MG TABS tablet Take 2 tablets (10 mg total) by mouth 2 (two) times daily for 5 days. 07/03/19 07/08/19  Thurnell Lose, MD  apixaban (ELIQUIS) 5 MG TABS tablet Take 1 tablet (5 mg total) by mouth 2 (two) times daily. 07/09/19   Thurnell Lose, MD  Blood Glucose Monitoring Suppl (ACCU-CHEK NANO SMARTVIEW) W/DEVICE KIT 1 Device by Does not apply route 4 (four) times daily - after meals and at bedtime. 11/29/14   Frazier Richards, MD  Cyanocobalamin 1000 MCG LOZG Take 2,000 mcg by mouth at bedtime.     [provider]  fexofenadine (ALLEGRA) 180 MG tablet Take 180 mg by mouth daily as needed.     [provider]  fluticasone (FLONASE) 50 MCG/ACT nasal spray Place 2 sprays into both nostrils daily as needed for allergies.  09/22/14   [provider]  glucose blood (ACCU-CHEK SMARTVIEW) test strip Check sugar 6 x daily Patient taking differently: Check sugar 6 x daily. Uses true test instead 11/29/14   Frazier Richards, MD  guaiFENesin-dextromethorphan [facility] DM) 100-10 MG/5ML syrup Take 5 mLs by mouth every 6 (six) hours as needed for cough. 07/03/19   Thurnell Lose, MD  HYDROcodone-homatropine [facility]) 5-1.5 MG/5ML syrup Take 5 mLs by mouth every 6 (six) hours as needed for cough. 12/15/19   Faustino Congress, NP  Insulin Glargine (LANTUS) 100 UNIT/ML Solostar Pen Inject 35 Units into the skin daily. Patient taking differently: Inject 80 Units into the skin 2 (two) times daily.  11/29/14   Frazier Richards, MD  insulin lispro protamine-lispro (HUMALOG 75/25 MIX) (75-25) 100 UNIT/ML SUSP injection Inject 50 Units into the skin 2 (two) times daily with a meal.     [provider]  Insulin Pen Needle (INSUPEN PEN NEEDLES) 32G X 4 MM MISC BD Pen Needles- brand specific. Inject insulin via insulin pen daily 11/29/14   Frazier Richards, MD  Lancet Devices [facility]) lancets Use as instructed for blood  glucose checks four times daily, before meals and at bedtime 11/29/14   Frazier Richards, MD  omeprazole (PRILOSEC) 40 MG capsule Take 40 mg by mouth daily.    [provider]  ondansetron (ZOFRAN) 4 MG tablet Take 1 tablet (4 mg total) by mouth every 8 (eight) hours as needed for nausea or vomiting. 07/03/19   Thurnell Lose, MD  rosuvastatin (CRESTOR) 10 MG tablet Take 10 mg by mouth 2 (two) times a week.    [provider]  SUMAtriptan (IMITREX) 100  MG tablet Take 1 tablet (100 mg total) by mouth every 2 (two) hours as needed for migraine or headache. 11/29/14   Frazier Richards, MD  topiramate (TOPAMAX) 100 MG tablet Take 200 mg by mouth at bedtime.     [provider]  TRUEPLUS LANCETS 33G MISC USE TO CHECK BLOOD GLUCOSE QID - AFTER MEALS AND AT BEDTIME 12/22/14   [provider]  valsartan (DIOVAN) 160 MG tablet Take 160 mg by mouth daily.    [provider]  VENTOLIN HFA 108 (90 BASE) MCG/ACT inhaler Use as directed 2 puffs in the mouth or throat 4 (four) times daily as needed. 12/21/14   [provider]    Physical Exam: Vitals:   03/27/20 1336 03/27/20 1341 03/27/20 1342 03/27/20 1441  BP:    (!) 146/103  Pulse: (!) 107 (!) 104 (!) 105 97  Resp: 14 (!) 21 (!) 21 20  Temp:      TempSrc:      SpO2: 98% 99% 100% 98%     . General:  Appears calm but emotionally labile . 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 ENT:  grossly normal hearing, significant L facial droop, L tongue deviation . Neck:  no LAD, masses or thyromegaly; no carotid bruits . Cardiovascular:  RRR, no m/r/g. No LE edema.  Marland Kitchen Respiratory:   CTA bilaterally with no wheezes/rales/rhonchi.  Normal respiratory effort. . Abdomen:  soft, NT, ND . Skin:  Chronic fairly symmetric hyperpigmented patches along B lower legs     . Musculoskeletal:  Mildly decreased strength of RLE > RUE, good ROM, no bony  abnormality . Psychiatric:  Depressed/emotionally labile mood and affect, speechquite dysarthric Neurologic:  Marked L facial droop   Radiological Exams on Admission: Independently reviewed - see discussion in A/P where applicable  CT Code Stroke CTA Head W/WO contrast  Result Date: 03/27/2020 CLINICAL DATA:  39 year old female code stroke presentation. Slurred speech. Plain head CT suspicious for right MCA infarct ASPECTS 8. History of diabetes, hypertension. Family history of stroke. Status post COVID-19 and PE in February. EXAM: CT ANGIOGRAPHY HEAD AND NECK CT PERFUSION BRAIN TECHNIQUE: Multidetector CT imaging of the head and neck was performed using the standard protocol during bolus administration of intravenous contrast. Multiplanar CT image reconstructions and MIPs were obtained to evaluate the vascular anatomy. Carotid stenosis measurements (when applicable) are obtained utilizing NASCET criteria, using the distal internal carotid diameter as the denominator. Multiphase CT imaging of the brain was performed following IV bolus contrast injection. Subsequent parametric perfusion maps were calculated using RAPID software. CONTRAST:  155m OMNIPAQUE IOHEXOL 350 MG/ML SOLN COMPARISON:  plain head CT  0929 hours today. FINDINGS: CT Brain Perfusion Findings: ASPECTS: 8 CBF (<30%) Volume: 155mPerfusion (Tmax>6.0s) volume: 222m Hypoperfusion index 0.5. Mismatch Volume: 6mL44mfarction Location:Right insula, operculum, largely corresponding to the plain CT findings. CTA NECK Skeleton: No acute osseous abnormality identified. Upper chest: Negative aside from mild motion artifact. Other neck: Negative. Aortic arch: 3 vessel arch configuration.  No arch atherosclerosis. Right carotid system: Negative aside from a partially retropharyngeal course. Left carotid system: Negative. Vertebral arteries: Detail of the proximal right subclavian artery and right vertebral origin partially obscured by dense right  subclavian venous contrast. The right V1 segment appears normal. The right vertebral is patent to the skull base with no plaque or stenosis identified. Normal proximal left subclavian artery and left vertebral artery  origin. Left vertebral artery is fairly codominant and within normal limits to the skull base. CTA HEAD Posterior circulation: Codominant distal vertebral arteries are within normal limits. Normal PICA origins and vertebrobasilar junction. Patent basilar artery without stenosis. AICA, SCA and PCA origins are within normal limits. Posterior communicating arteries are diminutive or absent. There is mild irregularity of the right PCA P2 segment (series 2, image 23. No significant stenosis. Otherwise bilateral PCA branches are within normal limits. Anterior circulation: Both ICA siphons are patent with no plaque or stenosis identified. Patent carotid termini. Normal MCA and ACA origins. Mildly dominant right A1. mild irregularity of the left A1 (series 10, image 22). Anterior communicating artery within normal limits. Other bilateral PCA branches are tortuous but within normal limits. Left MCA M1 segment and bifurcation are patent without stenosis. But thick MIP images on series 12 suggest mild irregularity of left MCA M2 and M3 branches. Right MCA M1 segment and right MCA trifurcation are patent without stenosis. No right MCA M2 branch occlusion or irregularity is identified. But a right M3 branch occlusion in the middle division is identified on series 12, image 14 and series 7 images 88 and 87. Other right MCA branches appear within normal limits. Venous sinuses: Early contrast timing, not evaluated. Anatomic variants: Mildly dominant right A1. Review of the MIP images confirms the above findings IMPRESSION: 1. Negative for large vessel occlusion but positive for Right MCA M3 occlusion. CT Perfusion detects core infarct with minimally larger penumbra at the right insula/operculum corresponding to the  plain CT finding. Preliminary report of the above These results were communicated to Dr. Curly Shores at 1008 hours on 03/27/2020 by text page via the [facility] messaging system. 2. Additionally, mild vessel irregularity is noted in multiple other circle-of-Willis branches, including the left A1, right P2, left M2. Although nonspecific this constellation of clinical and imaging findings might indicate accelerated branch vessel atherosclerosis. Although other large vessels appear normal, with no atherosclerosis in the neck or at the aortic arch. Electronically Signed   By: Genevie Ann M.D.   On: 03/27/2020 10:24   CT Code Stroke CTA Neck W/WO contrast  Result Date: 03/27/2020 CLINICAL DATA:  39 year old female code stroke presentation. Slurred speech. Plain head CT suspicious for right MCA infarct ASPECTS 8. History of diabetes, hypertension. Family history of stroke. Status post COVID-19 and PE in February. EXAM: CT ANGIOGRAPHY HEAD AND NECK CT PERFUSION BRAIN TECHNIQUE: Multidetector CT imaging of the head and neck was performed using the standard protocol during bolus administration of intravenous contrast. Multiplanar CT image reconstructions and MIPs were obtained to evaluate the vascular anatomy. Carotid stenosis measurements (when applicable) are obtained utilizing NASCET criteria, using the distal internal carotid diameter as the denominator. Multiphase CT imaging of the brain was performed following IV bolus contrast injection. Subsequent parametric perfusion maps were calculated using RAPID software. CONTRAST:  171m OMNIPAQUE IOHEXOL 350 MG/ML SOLN COMPARISON:  plain head CT  0929 hours today. FINDINGS: CT Brain Perfusion Findings: ASPECTS: 8 CBF (<30%) Volume: 177mPerfusion (Tmax>6.0s) volume: 2270m Hypoperfusion index 0.5. Mismatch Volume: 6mL53mfarction Location:Right insula, operculum, largely corresponding to the plain CT findings. CTA NECK Skeleton: No acute osseous abnormality identified. Upper chest:  Negative aside from mild motion artifact. Other neck: Negative. Aortic arch: 3 vessel arch configuration.  No arch atherosclerosis. Right carotid system: Negative aside from a partially retropharyngeal course. Left carotid system: Negative. Vertebral arteries: Detail of the proximal right subclavian artery and right vertebral origin partially obscured by  dense right subclavian venous contrast. The right V1 segment appears normal. The right vertebral is patent to the skull base with no plaque or stenosis identified. Normal proximal left subclavian artery and left vertebral artery origin. Left vertebral artery is fairly codominant and within normal limits to the skull base. CTA HEAD Posterior circulation: Codominant distal vertebral arteries are within normal limits. Normal PICA origins and vertebrobasilar junction. Patent basilar artery without stenosis. AICA, SCA and PCA origins are within normal limits. Posterior communicating arteries are diminutive or absent. There is mild irregularity of the right PCA P2 segment (series 2, image 23. No significant stenosis. Otherwise bilateral PCA branches are within normal limits. Anterior circulation: Both ICA siphons are patent with no plaque or stenosis identified. Patent carotid termini. Normal MCA and ACA origins. Mildly dominant right A1. mild irregularity of the left A1 (series 10, image 22). Anterior communicating artery within normal limits. Other bilateral PCA branches are tortuous but within normal limits. Left MCA M1 segment and bifurcation are patent without stenosis. But thick MIP images on series 12 suggest mild irregularity of left MCA M2 and M3 branches. Right MCA M1 segment and right MCA trifurcation are patent without stenosis. No right MCA M2 branch occlusion or irregularity is identified. But a right M3 branch occlusion in the middle division is identified on series 12, image 14 and series 7 images 88 and 87. Other right MCA branches appear within normal  limits. Venous sinuses: Early contrast timing, not evaluated. Anatomic variants: Mildly dominant right A1. Review of the MIP images confirms the above findings IMPRESSION: 1. Negative for large vessel occlusion but positive for Right MCA M3 occlusion. CT Perfusion detects core infarct with minimally larger penumbra at the right insula/operculum corresponding to the plain CT finding. Preliminary report of the above These results were communicated to Dr. Curly Shores at 1008 hours on 03/27/2020 by text page via the [facility] messaging system. 2. Additionally, mild vessel irregularity is noted in multiple other circle-of-Willis branches, including the left A1, right P2, left M2. Although nonspecific this constellation of clinical and imaging findings might indicate accelerated branch vessel atherosclerosis. Although other large vessels appear normal, with no atherosclerosis in the neck or at the aortic arch. Electronically Signed   By: Genevie Ann M.D.   On: 03/27/2020 10:24   CT ABDOMEN PELVIS W CONTRAST  Result Date: 03/27/2020 CLINICAL DATA:  Flank pain. EXAM: CT ABDOMEN AND PELVIS WITH CONTRAST TECHNIQUE: Multidetector CT imaging of the abdomen and pelvis was performed using the standard protocol following bolus administration of intravenous contrast. CONTRAST:  8m OMNIPAQUE IOHEXOL 300 MG/ML  SOLN COMPARISON:  CT pelvis 06/30/2019.  CT abdomen/pelvis 06/22/2019. FINDINGS: Lower chest: Relatively well-defined 2.2 cm lesion in the inferior lingula (image 4/series 5) measures water density. This may be loculated pleural fluid or intraparenchymal fluid collection in this patient with a history of pulmonary embolus and bilateral ground-glass airspace disease on the previous study suspicious for multifocal atypical/viral pneumonia. Heart size upper normal. Hepatobiliary: No suspicious focal abnormality within the liver parenchyma. There is no evidence for gallstones, gallbladder wall thickening, or pericholecystic fluid. No  intrahepatic or extrahepatic biliary dilation. Pancreas: No focal mass lesion. No dilatation of the main duct. No intraparenchymal cyst. No peripancreatic edema. Spleen: No splenomegaly. No focal mass lesion. Adrenals/Urinary Tract: No adrenal nodule or mass. New 18 mm hypoattenuating lesion identified upper pole right kidney with areas of subcapsular multifocal decreased enhancement in the lower pole right kidney. Left kidney unremarkable. No evidence for hydroureter. The  urinary bladder appears normal for the degree of distention. Stomach/Bowel: Stomach is unremarkable. No gastric wall thickening. No evidence of outlet obstruction. Duodenum is normally positioned as is the ligament of Treitz. No small bowel wall thickening. No small bowel dilatation. The terminal ileum is normal. The appendix is normal. No gross colonic mass. No colonic wall thickening. Vascular/Lymphatic: No abdominal aortic aneurysm. No abdominal aortic atherosclerotic calcification. There is no gastrohepatic or hepatoduodenal ligament lymphadenopathy. No retroperitoneal or mesenteric lymphadenopathy. No pelvic sidewall lymphadenopathy. Reproductive: The uterus is unremarkable.  There is no adnexal mass. Other: No intraperitoneal free fluid. Musculoskeletal: No worrisome lytic or sclerotic osseous abnormality. IMPRESSION: 1. New 18 mm hypoattenuating lesion upper pole right kidney with areas of subcapsular multifocal decreased enhancement in the lower pole right kidney. Imaging features are nonspecific and could be related to pyelonephritis and phlegmon/evolving abscess in the upper pole right kidney cannot be excluded. Alternatively, multifocal right renal infarct could have this appearance. Correlation with urinalysis may prove helpful. 2. Relatively well-defined 2.2 cm lesion in the inferior lingula measures water density. This may be loculated pleural fluid or intraparenchymal fluid collection in this patient with a history of pulmonary  embolus and bilateral ground-glass airspace disease on the previous study suspicious for multifocal atypical/viral pneumonia. Consider follow-up to ensure resolution. Electronically Signed   By: Misty Stanley M.D.   On: 03/27/2020 12:03   CT Code Stroke Cerebral Perfusion with contrast  Result Date: 03/27/2020 CLINICAL DATA:  39 year old female code stroke presentation. Slurred speech. Plain head CT suspicious for right MCA infarct ASPECTS 8. History of diabetes, hypertension. Family history of stroke. Status post COVID-19 and PE in February. EXAM: CT ANGIOGRAPHY HEAD AND NECK CT PERFUSION BRAIN TECHNIQUE: Multidetector CT imaging of the head and neck was performed using the standard protocol during bolus administration of intravenous contrast. Multiplanar CT image reconstructions and MIPs were obtained to evaluate the vascular anatomy. Carotid stenosis measurements (when applicable) are obtained utilizing NASCET criteria, using the distal internal carotid diameter as the denominator. Multiphase CT imaging of the brain was performed following IV bolus contrast injection. Subsequent parametric perfusion maps were calculated using RAPID software. CONTRAST:  167m OMNIPAQUE IOHEXOL 350 MG/ML SOLN COMPARISON:  plain head CT  0929 hours today. FINDINGS: CT Brain Perfusion Findings: ASPECTS: 8 CBF (<30%) Volume: 124mPerfusion (Tmax>6.0s) volume: 2271m Hypoperfusion index 0.5. Mismatch Volume: 6mL49mfarction Location:Right insula, operculum, largely corresponding to the plain CT findings. CTA NECK Skeleton: No acute osseous abnormality identified. Upper chest: Negative aside from mild motion artifact. Other neck: Negative. Aortic arch: 3 vessel arch configuration.  No arch atherosclerosis. Right carotid system: Negative aside from a partially retropharyngeal course. Left carotid system: Negative. Vertebral arteries: Detail of the proximal right subclavian artery and right vertebral origin partially obscured by dense  right subclavian venous contrast. The right V1 segment appears normal. The right vertebral is patent to the skull base with no plaque or stenosis identified. Normal proximal left subclavian artery and left vertebral artery origin. Left vertebral artery is fairly codominant and within normal limits to the skull base. CTA HEAD Posterior circulation: Codominant distal vertebral arteries are within normal limits. Normal PICA origins and vertebrobasilar junction. Patent basilar artery without stenosis. AICA, SCA and PCA origins are within normal limits. Posterior communicating arteries are diminutive or absent. There is mild irregularity of the right PCA P2 segment (series 2, image 23. No significant stenosis. Otherwise bilateral PCA branches are within normal limits. Anterior circulation: Both ICA siphons are patent with  no plaque or stenosis identified. Patent carotid termini. Normal MCA and ACA origins. Mildly dominant right A1. mild irregularity of the left A1 (series 10, image 22). Anterior communicating artery within normal limits. Other bilateral PCA branches are tortuous but within normal limits. Left MCA M1 segment and bifurcation are patent without stenosis. But thick MIP images on series 12 suggest mild irregularity of left MCA M2 and M3 branches. Right MCA M1 segment and right MCA trifurcation are patent without stenosis. No right MCA M2 branch occlusion or irregularity is identified. But a right M3 branch occlusion in the middle division is identified on series 12, image 14 and series 7 images 88 and 87. Other right MCA branches appear within normal limits. Venous sinuses: Early contrast timing, not evaluated. Anatomic variants: Mildly dominant right A1. Review of the MIP images confirms the above findings IMPRESSION: 1. Negative for large vessel occlusion but positive for Right MCA M3 occlusion. CT Perfusion detects core infarct with minimally larger penumbra at the right insula/operculum corresponding to  the plain CT finding. Preliminary report of the above These results were communicated to Dr. Curly Shores at 1008 hours on 03/27/2020 by text page via the [facility] messaging system. 2. Additionally, mild vessel irregularity is noted in multiple other circle-of-Willis branches, including the left A1, right P2, left M2. Although nonspecific this constellation of clinical and imaging findings might indicate accelerated branch vessel atherosclerosis. Although other large vessels appear normal, with no atherosclerosis in the neck or at the aortic arch. Electronically Signed   By: Genevie Ann M.D.   On: 03/27/2020 10:24   CT HEAD CODE STROKE WO CONTRAST  Result Date: 03/27/2020 CLINICAL DATA:  Code stroke.  Slurred speech. EXAM: CT HEAD WITHOUT CONTRAST TECHNIQUE: Contiguous axial images were obtained from the base of the skull through the vertex without intravenous contrast. COMPARISON:  None. FINDINGS: Brain: There is loss of gray-white differentiation and subtle hypoattenuation involving the right insula and the right posterior frontal cortex and underlying white matter. Mild sulcal effacement without substantial mass effect. No midline shift. Basal cisterns are patent. No acute hemorrhage. No hydrocephalus. No mass lesion. Vascular: No definite hyperdense vessel identified. Skull: No acute fracture. Sinuses/Orbits: No acute finding. Other: No mastoid effusions. ASPECTS [facility] Stroke Program Early CT Score) - Ganglionic level infarction (caudate, lentiform nuclei, internal capsule, insula, M1-M3 cortex): 6 - Supraganglionic infarction (M4-M6 cortex): 2 Total score (0-10 with 10 being normal): 8 IMPRESSION: 1. Findings concerning for acute or early subacute infarct involving the right insula and posterior frontal lobe (MCA territory). ASPECTS is 8. 2. No substantial mass effect or acute hemorrhage. Code stroke imaging results were communicated on 03/27/2020 at 9:38 am to provider Dr. Curly Shores Via telephone, who verbally  acknowledged these results. Electronically Signed   By: Margaretha Sheffield MD   On: 03/27/2020 09:44    EKG: Independently reviewed.  Sinus tachycardia with rate 102; no evidence of acute ischemia   Labs on Admission: I have personally reviewed the available labs and imaging studies at the time of the admission.  Pertinent labs:   Glucose 373 Albumin 3.1 WBC 11.7 INR 1.2 A1c 11.2 COVID/flu negative UA not interpretable but rare bacteria on micro UDS negative HCG negative   Assessment/Plan Principal Problem:   Acute CVA (cerebrovascular accident) ([place]) Active Problems:   Essential hypertension   Asthma, chronic   Pulmonary embolism ([place])   Sleep apnea   Diabetes mellitus without complication ([place])   [id number] long hauler    CVA -Patient presenting  with acute onset of left-sided facial droop and marked dysarthria, present upon awakening this AM so uncertain timeframe -ABCD2 score is 6, high risk for CVA -tPA can be given within 4.5 hours of symptom onset; this patient was not deemed to be a candidate for tPA therapy due to uncertain duration since onset of symptoms -Aspirin has been given to reduce stroke mortality and decrease morbidity -Will admit for further CVA evaluation -Telemetry monitoring -MRI/MRA -CTA performed without significant evidence of carotid stenosis -Echo with bubble study is pending -This does appear to have been embolic in nature based on CVA in conjunction with probable renal infarcts -If the patient does not have known afib and this is not detected on telemetry during hospitalization, consider outpatient Holter monitoring and/or loop recorder placement. -Further reasons for hypercoagulability may include COVID long-haulers syndrome (see below) and possible autoimmune syndrome (see below) -Risk stratification with FLP, A1c; will also check TSH and UDS -Patient will need DAPT for 21 days when ABCD2 score is at least 4 and NIH score is 3 or less, and  then can transition to monotherapy with a single antiplatelet agent.  ASA appears to be reasonable.  Will defer to neurology for now. -Consider thrombectomy if there is persistent disabling neurologic deficit associated with a vascular cut-off; despite apparent M3 cut-off, she was not felt to be a candidate for intervention as per neurology -Neurology consult -PT/OT/ST/Nutrition Consults -CIR consult for placement -Continue Lexapro, Baclofen -Depo Provera is less likely related to CVA than estrogen containing birth control, but this may need consideration of discontinuation  HTN -Allow permissive HTN for now -Treat BP only if >220/120, and then with goal of 15% reduction -Hold ARB and plan to restart in 48-72 hours -Thiazide diuretics are recommended as first-line therapeutic agents vs. 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 -She required hospitalization in Jan 2021 with treatment with Remdesivir and steroids -She has had persistent cough and fatigue -She also had associated PE -This raises the question of whether COVID led to hypercoagulability state that is persistent  H/o PE -Previously on Eliquis for COVID-associated PE -Given concern for recurrent embolic issues, she appears to need long-term AC -Will hold for now, but this will need consideration during hospitalization   ?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, lymphohistiocytic response and granulation tissue; there was concern that this was autoimmune in nature -She reports onset of B mostly symmetric LE patches that were pruritic in nature following onset of her eye lesion; these were again thought to be autoimmune in  nature -Records are not available at this time, but she reports autoimmune testing was performed and negative -This also may place her at increased risk of hypercoagulability and may warrant further evaluation  Asthma -Continue Flovent, prn Albuterol  OSA -Continue CPAP  Obesity  -BMI 38.5 -Weight loss should be encouraged -Outpatient PCP/bariatric medicine/bariatric surgery f/u encouraged    Note: This patient has been tested and is negative for the novel coronavirus COVID-19. She has been fully vaccinated against COVID-19.    DVT prophylaxis:  Lovenox  Code Status: Full - confirmed with patient/family Family Communication: Mother present throughout evaluation Disposition Plan:  The patient is from: home  Anticipated d/c  is to: CIR; consult placed  Anticipated d/c date will depend on clinical response to treatment, but likely several days  Patient is currently: acutely ill Consults called: Neurology; PT/OT/ST/Nutrition; DM coordinator; CIR Admission status: Admit - It is my clinical opinion that admission to INPATIENT is reasonable and necessary because of the expectation that this patient will require hospital Shields that crosses at least 2 midnights to treat this condition based on the medical complexity of the problems presented.  Given the aforementioned information, the predictability of an adverse outcome is felt to be significant.      Karmen Bongo MD [facility]   How to contact the [facility] Attending or Consulting provider [place] or covering provider during after hours [place], for this patient?  1. Check the [facility] team in [facility] and look for a) attending/consulting [facility] provider listed and b) the [facility] team listed 2. Log into [email or web address] and use 's universal password to access. If you do not have the password, please contact the hospital operator. 3. Locate the [facility] provider you are looking for under [facility] and page to a number that you can be  directly reached. 4. If you still have difficulty reaching the provider, please page the [facility] (Director on Call) for the Hospitalists listed on amion for assistance.   03/27/2020, 3:11 PM

## 2020-03-27 NOTE — ED Notes (Signed)
Clarified with Dr. Ophelia Charter if patient will need to follow the SSI that was ordered prior putting patient on NPO status. Dr. Ophelia Charter said to keep following the SSI ordered at the moment.

## 2020-03-27 NOTE — ED Triage Notes (Signed)
Pt BIB EMS from home due to stroke like s/s. Pt presents with slurred speech. Pt LKN was 8pm. Mother last spoke with patient around this time. Pt woke up this morning at 6am with slurred speech. Pt is able to answer questions but sufficient slurred speech notes. Pt has no h/o stroke. Pt reports she had covid in January and had a P.E but no longer takes blood thinners. Pt has h/o hyperglycemia and diabetes.

## 2020-03-27 NOTE — ED Notes (Signed)
Report received from primary RN.  

## 2020-03-27 NOTE — Consult Note (Signed)
Neurology Consultation Reason for Consult: Slurred speech and left-sided weakness Referring Physician: Alvira Monday  CC: Slurred speech   History is obtained from: Patient, mother and chart review   HPI: Shelly Shields is a 39 y.o. female with past medical history significant for type 2 diabetes, hypertension, hyperlipidemia, obesity, sleep apnea, migraines (not hemiplegic), asthma, COVID-19 (January 2021, complicated by PE in February, no longer on anticoagulation).  She notes she was in her usual state of health yesterday when she developed some right flank pain, as well as some nausea, and diarrhea this morning.  She also reports having had some chills last night.  Regarding her migraine history, per chart review she has had migraines since her 95s and is on Topamax 100 mg twice daily, nortriptyline 20 mg nightly, and Imitrex (which back in May 2020 when she had not been eating frequently) as well as baclofen as needed for headaches.  In May she was taking Tylenol 1000 mg once weekly.  She does report worsened severity/frequency of migraines recently.  She did call for a refill on her Imitrex in early September 2021.  She is planning for neurology follow-up in the near future.  Regarding her autoimmune history, per notes from Dr.  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 The patient is due to see a pulmonologist, and I asked her to mention sarcoidosis to the doctor to ensure it makes it to the differential diagnosis."  The patient reports that her autoimmune work-up has been negative to date and has included some evaluation of skin lesions on her legs  LKW: 8 PM on 10/31  tPA given?: No, due to hypodensity and out of the window IA performed?: No, due to only 6 mL tissue at risk Premorbid modified rankin scale:       0 - No symptoms.  ROS: A 14 point ROS was performed and is negative except as noted in the HPI, though somewhat limited secondary to dysarthria.   Past Medical History:  Diagnosis Date  . Asthma   . Diabetes mellitus without complication (HCC)   . Hypertension   . Migraines   . Obesity   . Sleep apnea     Family History  Problem Relation Age of Onset  . Hypertension Mother   . Thyroid disease Mother   . Hypertension Father   . Stroke Father   . Diabetes Father   . Sleep apnea Father   Mother reports history of strokes on the maternal family side as well  Social History:  reports that she has never smoked. She has never used smokeless tobacco. She reports current alcohol use. She reports that she does not use drugs.  Exam: Current vital signs: BP (!) 155/102 (BP Location: Right Arm)   Pulse (!) 103   Temp 98 F (36.7 C) (Oral)   Resp 18   SpO2 100%  Vital signs in last 24 hours: Temp:  [98 F (36.7 C)] 98 F (36.7 C) (11/01 0905) Pulse Rate:  [103] 103 (11/01 0905) Resp:  [18] 18 (11/01 0905) BP: (155)/(102) 155/102 (11/01 0905) SpO2:  [100 %] 100 % (11/01 0905)   Physical Exam  Constitutional: Appears well-developed and obese Psych: Affect tearful, anxious Eyes: No scleral injection, please see photo documentation by internal medicine H&P for left eye lesion HENT: No OP obstruction, fair dentition MSK:  no joint deformities.  Cardiovascular: Normal rate and regular rhythm.  Respiratory: Tachypneic without grossly audible wheezing GI: Soft.  No distension. There is no tenderness to palpation.  Skin: Bilateral lower extremity chronic lesions  Neuro: Mental Status: Patient is awake, alert, oriented to person, place, month, year, and situation. Ability to give a clear and coherent history is limited only by her significant dysarthria and tearfulness While she has a mild right gaze preference, she is able to easily attend to double simultaneous stimuli both  visual and tactile Cranial Nerves: II: Visual Fields are full. Pupils are equal, round, and reactive to light.  III,IV, VI: EOMI without ptosis or diploplia, though notable for a right gaze preference V: Facial sensation is symmetric to light touch VII: Facial movement is notable for a left facial droop.  VIII: hearing is intact to voice X: Uvula difficult to visualize XI: Head turn is symmetric XII: tongue is midline without atrophy or fasciculations.  Motor: Tone is normal. Bulk is normal.  Right upper extremity pronation without drift, Mild 4 out of 5 weakness in the right upper and lower extremity otherwise 5 out of 5 on the left Sensory: Sensation is symmetric to light touch and temperature in the arms and legs.  Without extinction to double simultaneous stimuli Plantars: Toes are downgoing bilaterally.  Cerebellar: Finger-to-nose and foot to hand are intact bilaterally  NIH stroke scale 6 at ~10 AM 1 for partial gaze deviation to the right, 2 for facial droop 1 for left leg drift (no drift in left arm although there was pronation, so this was not scored) 2 for dysarthria    I have reviewed labs in epic and the results pertinent to this consultation are: Glucose in the 300s, creatinine 0.7, white blood cell count 11.7 Hemoglobin A1c 11.2  I have reviewed the images obtained: Head CT aspects 8 with hypodensity in the right insular and right parietal areas CTA with a M3 branch occlusion and minimal tissue at risk on CT perfusion  Echo report notable for EEG ejection fraction of 20 to 25% as well as grade 2 diastolic dysfunction and moderately dilated left atrium, negative for PFO with bubble study  Impression: This is a  Recommendations: This is a 39 year old woman with a past medical history significant for potential sarcoidosis, severely uncontrolled diabetes, obesity, hypertension, hyperlipidemia, presenting with an acute stroke.  CNS sarcoidosis can rarely lead to  stroke; this patient more than likely has an embolic stroke in the setting of her cardiac dysfunction.  However sarcoidosis can involve the heart and contribute to reduced EF.  Sarcoidosis protocol PET may be useful to clarify whether there is ongoing inflammation suggestive of sarcoidosis given her prior history.  She will be very challenging to treat immunologically given her uncontrolled metabolic syndrome which would be worsened by steroids that are typically used for sarcoidosis.  Outpatient a steroid sparing agent may be considered if evidence of sarcoidosis is found.  Otherwise would proceed with aggressive risk factor modification (A1c, lipids).  Additionally ordered a hypercoagulable panel  # Right M3 ischemic stroke - Stroke labs TSH, ESR, HgbA1c, fasting lipid panel, hypercoagulability panel - MRI brain  - CTA completed as above  - Frequent neuro checks - Echocardiogram w/ PFO study  - Prophylactic therapy-Antiplatelet med: Aspirin - dose $Remo'325mg'mnzOY$  PO or $Rem'300mg'rBUR$  PR, followed by 81 mg daily - Consider Plavix 300 mg load with 75 mg daily for 21 - 90 day course if no indication for [facility]   -  Risk factor modification - Telemetry monitoring; 30 day event monitor on discharge if no arrythmias captured  - Blood pressure goal   - Permissive hypertension to 220/120 due to M3 branch occlusion  - PT consult, OT consult, Speech consult, unless patient is back to baseline - Consider PET/CT to evaluate for sarcoidosis - Stroke team to follow  # Migraines -Patient to be counseled on discontinuing Imitrex as this is contraindicated given she has had a stroke  Appreciate management of flank pain, chills per ED/primary team  Lesleigh Noe MD-PhD [facility] [phone]

## 2020-03-27 NOTE — ED Notes (Signed)
Mother number is [phone]

## 2020-03-27 NOTE — Code Documentation (Signed)
Stroke Response Nurse Documentation Code Documentation  Shelly Shields is a 39 y.o. female arriving to [facility] ED via EMS on 11/1 with past medical hx of HTN, Diabetes, and Sleep Apnea. Code stroke was activated by ED after patient arrived with right gaze preference, slurred speech, and left sided weakness. Patient from home where she was LKW at 2000 last night when she spoke to her mother on the phone. This morning, pt woke up at 0600 with symptoms and called EMS.   Stroke team at the bedside once Code Stroke Activated. Labs drawn and patient cleared for CT by Dr. Dalene Seltzer. Patient to CT with ED RN. NIHSS 7, see documentation for details and code stroke times. Patient with right gaze preference , left facial droop, left arm weakness, left leg weakness and dysarthria  on exam. The following imaging was completed:  CT, CTA head and neck, CTP. Patient is not a candidate for tPA due to being outside the window. Pt is not an IR candidate due to Mismatch Ratio < 1.5 with no Large vessel occlusion identified. Care/Plan: Admit pt. Q2 mNIHSS/VS. Bedside handoff with ED RN Marylu Lund.      Lucila Maine  Stroke Response RN

## 2020-03-28 ENCOUNTER — Inpatient Hospital Stay (HOSPITAL_COMMUNITY): Payer: 59 | Admitting: Anesthesiology

## 2020-03-28 ENCOUNTER — Inpatient Hospital Stay (HOSPITAL_COMMUNITY): Payer: 59

## 2020-03-28 ENCOUNTER — Encounter (HOSPITAL_COMMUNITY): Payer: Self-pay | Admitting: Internal Medicine

## 2020-03-28 ENCOUNTER — Encounter (HOSPITAL_COMMUNITY)
Admission: EM | Disposition: A | Discharge status: Home / Self Care | Payer: Self-pay | Source: Home / Self Care | Attending: Internal Medicine

## 2020-03-28 DIAGNOSIS — I639 Cerebral infarction, unspecified: Secondary | ICD-10-CM

## 2020-03-28 DIAGNOSIS — G4733 Obstructive sleep apnea (adult) (pediatric): Secondary | ICD-10-CM

## 2020-03-28 DIAGNOSIS — E669 Obesity, unspecified: Secondary | ICD-10-CM

## 2020-03-28 DIAGNOSIS — I429 Cardiomyopathy, unspecified: Secondary | ICD-10-CM

## 2020-03-28 DIAGNOSIS — I152 Hypertension secondary to endocrine disorders: Secondary | ICD-10-CM

## 2020-03-28 DIAGNOSIS — U099 Post covid-19 condition, unspecified: Secondary | ICD-10-CM

## 2020-03-28 HISTORY — PX: TEE WITHOUT CARDIOVERSION: SHX5443

## 2020-03-28 LAB — GLUCOSE, CAPILLARY
Glucose-Capillary: 169 mg/dL — ABNORMAL HIGH (ref 70–99)
Glucose-Capillary: 193 mg/dL — ABNORMAL HIGH (ref 70–99)
Glucose-Capillary: 205 mg/dL — ABNORMAL HIGH (ref 70–99)
Glucose-Capillary: 218 mg/dL — ABNORMAL HIGH (ref 70–99)
Glucose-Capillary: 263 mg/dL — ABNORMAL HIGH (ref 70–99)

## 2020-03-28 LAB — LIPID PANEL
Cholesterol: 176 mg/dL (ref 0–200)
HDL: 29 mg/dL — ABNORMAL LOW (ref >40)
LDL Cholesterol: 126 mg/dL — ABNORMAL HIGH (ref 0–99)
Total CHOL/HDL Ratio: 6.1 RATIO
Triglycerides: 106 mg/dL (ref <150)
VLDL: 21 mg/dL (ref 0–40)

## 2020-03-28 LAB — ANTITHROMBIN III: AntiThromb III Func: 99 % (ref 75–120)

## 2020-03-28 LAB — TSH: TSH: 3.896 u[IU]/mL (ref 0.350–4.500)

## 2020-03-28 SURGERY — ECHOCARDIOGRAM, TRANSESOPHAGEAL
Anesthesia: Monitor Anesthesia Care

## 2020-03-28 MED ORDER — ROSUVASTATIN CALCIUM 20 MG PO TABS
20.0000 mg | ORAL_TABLET | Freq: Every day | ORAL | Status: DC
  Administered 2020-03-28 – 2020-03-29 (×2): 20 mg via ORAL
  Filled 2020-03-28 (×2): qty 1

## 2020-03-28 MED ORDER — PROPOFOL 500 MG/50ML IV EMUL
INTRAVENOUS | Status: DC | PRN
  Administered 2020-03-28: 125 ug/kg/min via INTRAVENOUS

## 2020-03-28 MED ORDER — MIDAZOLAM HCL 5 MG/5ML IJ SOLN
INTRAMUSCULAR | Status: DC | PRN
  Administered 2020-03-28: 2 mg via INTRAVENOUS

## 2020-03-28 MED ORDER — ONDANSETRON HCL 4 MG/2ML IJ SOLN
INTRAMUSCULAR | Status: DC | PRN
  Administered 2020-03-28: 4 mg via INTRAVENOUS

## 2020-03-28 MED ORDER — LACTATED RINGERS IV SOLN
INTRAVENOUS | Status: DC | PRN
  Administered 2020-03-28: 1000 mL via INTRAVENOUS

## 2020-03-28 NOTE — Progress Notes (Signed)
PROGRESS NOTE   Shelly Shields  ZOX:[id number]    DOB: [birth year]    DOA: 03/27/2020  PCP: Everlean Cherry, MD   I have briefly reviewed patients previous medical records in [facility].  Chief Complaint  Patient presents with  . Code Stroke    Brief Narrative:  39 year old female, lives with her cousins and independent, PMH of obesity, OSA on CPAP, HTN, poorly controlled DM2/IDDM, asthma, COVID-19 PNA with PE in January 2021, off of anticoagulation since June, completed COVID-19 vaccine x2 in June, presented with complaints of dysarthria and left-sided weakness on day of admission and right flank pain the day prior.  Admitted for acute right brain stroke, suspecting embolic etiology, neurology consulting.  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  Still has residual dysarthria and left hemiparesis. CT head code stroke: Findings concerning for acute or early subacute infarct involving the right insula and posterior frontal lobe (MCA territory). CTA head and neck and CT perfusion brain: Negative for large vessel occlusion but positive for right MCA M3 occlusion.  CT perfusion detects core infarct with minimally larger penumbra at the right insula/operculum. MRI brain: Moderate sized acute right MCA infarct.  Small subacute left parieto-occipital infarcts. MRA brain: Right M3 branch occlusion, otherwise negative MRA. TTE: LVEF 20-25%.  Severe hypokinesis of the basal to apical anteroseptum, inferoseptum and anterior LV walls.  Grade 2 diastolic dysfunction.  Bubble study negative for  interatrial shunt. TEE: LVEF 20%, global hypokinesis, no mural thrombus, small PFO with right-to-left shunting.  No obvious cause for cardiac source of cerebral emboli LDL 126.  Goal <70, continue Crestor 20 mg daily. A1c 11.2.  Goal <7.  Pregnancy test negative. Currently on aspirin, await stroke MD follow-up regarding further evaluation and management and need to start anticoagulation. PT evaluated and recommend CIR, consulted. ST recommends regular diet and thin liquids, need to continue rehab for dysarthria.  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 S/p TEE without embolic source.  Continue monitoring on telemetry for potential arrhythmias/A. fib etc. Per cardiology, she will eventually need ischemic work-up in view of underlying cardiac risk factors including DM, HTN, HLD.  Essential hypertension Allow permissive hypertension and treat BP only if >220/120 consistently. Holding ARB for now.  Hyperlipidemia LDL 126, goal <70.  Now on Crestor 20 mg daily.  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  Currently on home dose of Lantus Lantus 80 units twice daily and added SSI.  Mildly uncontrolled.  Monitor closely and adjust as needed.  Looking at her home meds, it appears confusing because she in addition to the Lantus is also on Humalog 75/25 which should not be.  We will need to optimize her DM control prior to DC.  Diabetes coordinator consulted.  Right flank pain/suspect embolic right renal infarct. CT abdomen and pelvis: New 18 mm hypoattenuating lesion upper pole right kidney with areas of subcapsular multifocal decreased enhancement in the lower pole right kidney-nonspecific features and could be related to pyelonephritis and phlegmon/evolving abscess in the upper pole  right kidney cannot be excluded.  Alternatively multifocal right renal  infarct. Urine microscopy not indicated above UTI: Rare bacteria and no significant pyuria or hematuria.  History of COVID-19 pneumonia in January 2021 with long-term sequelae Required hospitalization in January 2021 with treatment with remdesivir and steroids. Has persistent cough, fatigue and dyspnea on exertion and uses cane to ambulate. She had associated pulmonary embolism. Some question regarding Covid related hypercoagulability state that is persistent.  History of PE Previously on Eliquis for Covid associated PE Given concern for embolic related stroke and renal infarct, may be a candidate for long-term anticoagulation and await stroke MD follow-up.  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  No clinical bronchospasm  OSA -Continue CPAP  Abnormal finding on CT abdomen/2.2 cm lesion in the inferior lingula Relatively well-defined 2.2 cm lesion in the inferior lingula may be loculated pleural fluid or intraparenchymal fluid collection in this patient with history of pulmonary embolism and bilateral groundglass airspace disease on the previous study suspicious for multifocal atypical/viral pneumonia.  Recommend follow-up to ensure resolution  Body mass index is 37.79 kg/m./Obesity Weight loss needs to be encouraged and follow-up with outpatient PCP/bariatric medicine.  Migraine: Continue Topamax.   DVT prophylaxis: enoxaparin (LOVENOX)  injection 40 mg Start: 03/27/20 1600     Code Status: Full Code Family Communication: None at bedside Disposition:  Status is: Inpatient  Remains inpatient appropriate because:Inpatient level of care appropriate due to severity of illness   Dispo: The patient is from: Home              Anticipated d/c is to: CIR              Anticipated d/c date is: 3 days              Patient currently is not medically stable to d/c.        Consultants:   Neurology Cardiology  Procedures:   TEE 03/28/2020  Antimicrobials:    Anti-infectives (From admission, onward)   None        Subjective:  Patient seen this morning prior to procedure.  Reported mild headache.  Stated that her speech and left-sided weakness were no better than on admission.  No chest pain or dyspnea  Objective:   Vitals:   03/28/20 1226 03/28/20 1345 03/28/20 1355 03/28/20 1525  BP: (!) 144/95 126/85 127/74 (!) 131/92  Pulse: 96 99 93 96  Resp: (!) 23 (!) 24 (!) 22 16  Temp: 97.8 F (36.6 C) (!) 97.4 F (36.3 C)  98.9 F (37.2 C)  TempSrc: Oral Axillary  Oral  SpO2: 98% 97% 91% 100%  Weight: 90.7 kg     Height:  (1.549 m)       General exam: Young female, moderately built and obese lying comfortably propped up in bed without distress.  Oral mucosa with borderline hydration. Respiratory system: Clear to auscultation. Respiratory effort normal. Cardiovascular system: S1 & S2 heard, RRR. No JVD, murmurs, rubs, gallops or clicks. No pedal  edema.  Telemetry personally reviewed: SR-occasional ST in the 100s. Gastrointestinal system: Abdomen is nondistended, soft and nontender. No organomegaly or masses felt. Normal bowel sounds heard. Central nervous system: Alert and oriented x3.  Dysarthria +.?  Facial asymmetry.?  Expressive aphasia. Extremities: Right limbs grade 5 x 5 power.  Left limbs grade 4 x 5 power. Skin: No rashes, lesions or ulcers Psychiatry: Judgement and insight appear normal. Mood & affect  appropriate.     Data Reviewed:   I have personally reviewed following labs and imaging studies   CBC: Recent Labs  Lab 03/27/20 0946 03/27/20 1002  WBC 11.7*  --   NEUTROABS 9.7*  --   HGB 12.8 12.9  HCT 39.3 38.0  MCV 86.6  --   PLT 195  --     Basic Metabolic Panel: Recent Labs  Lab 03/27/20 0946 03/27/20 1002  NA 133* 137  K 3.6 3.7  CL 104 103  CO2 21*  --   GLUCOSE 373* 363*  BUN 5* 7  CREATININE 0.88 0.70  CALCIUM 8.4*  --     Liver Function Tests: Recent Labs  Lab 03/27/20 0946  AST 13*  ALT 18  ALKPHOS 82  BILITOT 1.0  PROT 6.2*  ALBUMIN 3.1*    CBG: Recent Labs  Lab 03/28/20 0430 03/28/20 0826 03/28/20 1523  GLUCAP 205* 193* 169*    Microbiology Studies:   Recent Results (from the past 240 hour(s))  Respiratory Panel by RT PCR (Flu A&B, Covid) - Nasopharyngeal Swab     Status: None   Collection Time: 03/27/20 10:04 AM   Specimen: Nasopharyngeal Swab  Result Value Ref Range Status   SARS Coronavirus 2 by RT PCR NEGATIVE NEGATIVE Final    Comment: (NOTE) SARS-CoV-2 target nucleic acids are NOT DETECTED.  The SARS-CoV-2 RNA is generally detectable in upper respiratoy specimens during the acute phase of infection. The lowest concentration of SARS-CoV-2 viral copies this assay can detect is 131 copies/mL. A negative result does not preclude SARS-Cov-2 infection and should not be used as the sole basis for treatment or other patient management decisions. A negative result may occur with  improper specimen collection/handling, submission of specimen other than nasopharyngeal swab, presence of viral mutation(s) within the areas targeted by this assay, and inadequate number of viral copies (<131 copies/mL). A negative result must be combined with clinical observations, patient history, and epidemiological information. The expected result is Negative.  Fact Sheet for Patients:  https://www.moore.com/  Fact Sheet  for Healthcare Providers:  https://www.young.biz/  This test is no t yet approved or cleared by the [place] FDA and  has been authorized for detection and/or diagnosis of SARS-CoV-2 by FDA under an Emergency Use Authorization (EUA). This EUA will remain  in effect (meaning this test can be used) for the duration of the COVID-19 declaration under Section 564(b)(1) of the Act, 21 U.S.C. section 360bbb-3(b)(1), unless the authorization is terminated or revoked sooner.     Influenza A by PCR NEGATIVE NEGATIVE Final   Influenza B by PCR NEGATIVE NEGATIVE Final    Comment: (NOTE) The Xpert Xpress SARS-CoV-2/FLU/RSV assay is intended as an aid in  the diagnosis of influenza from Nasopharyngeal swab specimens and  should not be used as a sole basis for treatment. Nasal washings and  aspirates are unacceptable for Xpert Xpress SARS-CoV-2/FLU/RSV  testing.  Fact Sheet for Patients: https://www.moore.com/  Fact Sheet for Healthcare Providers: https://www.young.biz/  This test is not yet approved or cleared by the  [place] [employer] and  has been authorized for detection and/or diagnosis of SARS-CoV-2 by  FDA under an TEFL teacher (EUA). This EUA will remain  in effect (meaning this test can be used) for the duration of the  Covid-19 declaration under Section 564(b)(1) of the Act, 21  U.S.C. section 360bbb-3(b)(1), unless the authorization is  terminated or revoked. Performed at [facility], [address], [place], [place] [address]      Radiology Studies:  CT Code Stroke CTA Head W/WO contrast  Result Date: 03/27/2020 CLINICAL DATA:  39 year old female code stroke presentation. Slurred speech. Plain head CT suspicious for right MCA infarct ASPECTS 8. History of diabetes, hypertension. Family history of stroke. Status post COVID-19 and PE in February. EXAM: CT ANGIOGRAPHY HEAD AND NECK CT PERFUSION  BRAIN TECHNIQUE: Multidetector CT imaging of the head and neck was performed using the standard protocol during bolus administration of intravenous contrast. Multiplanar CT image reconstructions and MIPs were obtained to evaluate the vascular anatomy. Carotid stenosis measurements (when applicable) are obtained utilizing NASCET criteria, using the distal internal carotid diameter as the denominator. Multiphase CT imaging of the brain was performed following IV bolus contrast injection. Subsequent parametric perfusion maps were calculated using RAPID software. CONTRAST:  OMNIPAQUE IOHEXOL 350 MG/ML SOLN COMPARISON:  plain head CT  0929 hours today. FINDINGS: CT Brain Perfusion Findings: ASPECTS: 8 CBF (<30%) Volume: 16mL Perfusion (Tmax>6.0s) volume: 22mL.  Hypoperfusion index 0.5. Mismatch Volume: 6mL Infarction Location:Right insula, operculum, largely corresponding to the plain CT findings. CTA NECK Skeleton: No acute osseous abnormality identified. Upper chest: Negative aside from mild motion artifact. Other neck: Negative. Aortic arch: 3 vessel arch configuration.  No arch atherosclerosis. Right carotid system: Negative aside from a partially retropharyngeal course. Left carotid system: Negative. Vertebral arteries: Detail of the proximal right subclavian artery and right vertebral origin partially obscured by dense right subclavian venous contrast. The right V1 segment appears normal. The right vertebral is patent to the skull base with no plaque or stenosis identified. Normal proximal left subclavian artery and left vertebral artery origin. Left vertebral artery is fairly codominant and within normal limits to the skull base. CTA HEAD Posterior circulation: Codominant distal vertebral arteries are within normal limits. Normal PICA origins and vertebrobasilar junction. Patent basilar artery without stenosis. AICA, SCA and PCA origins are within normal limits. Posterior communicating arteries are  diminutive or absent. There is mild irregularity of the right PCA P2 segment (series 2, image 23. No significant stenosis. Otherwise bilateral PCA branches are within normal limits. Anterior circulation: Both ICA siphons are patent with no plaque or stenosis identified. Patent carotid termini. Normal MCA and ACA origins. Mildly dominant right A1. mild irregularity of the left A1 (series 10, image 22). Anterior communicating artery within normal limits. Other bilateral PCA branches are tortuous but within normal limits. Left MCA M1 segment and bifurcation are patent without stenosis. But thick MIP images on series 12 suggest mild irregularity of left MCA M2 and M3 branches. Right MCA M1 segment and right MCA trifurcation are patent without stenosis. No right MCA M2 branch occlusion or irregularity is identified. But a right M3 branch occlusion in the middle division is identified on series 12, image 14 and series 7 images 88 and 87. Other right MCA branches appear within normal limits. Venous sinuses: Early contrast timing, not evaluated. Anatomic variants: Mildly dominant right A1. Review of the MIP images confirms the above findings IMPRESSION: 1. Negative for large vessel occlusion  but positive for Right MCA M3 occlusion. CT Perfusion detects core infarct with minimally larger penumbra at the right insula/operculum corresponding to the plain CT finding. Preliminary report of the above These results were communicated to Dr. Iver Nestle at 1008 hours on 03/27/2020 by text page via the [facility] messaging system. 2. Additionally, mild vessel irregularity is noted in multiple other circle-of-Willis branches, including the left A1, right P2, left M2. Although nonspecific this constellation of clinical and imaging findings might indicate accelerated branch vessel atherosclerosis. Although other large vessels appear normal, with no atherosclerosis in the neck or at the aortic arch. Electronically Signed   By: Odessa Fleming M.D.   On:  03/27/2020 10:24   CT Code Stroke CTA Neck W/WO contrast  Result Date: 03/27/2020 CLINICAL DATA:  39 year old female code stroke presentation. Slurred speech. Plain head CT suspicious for right MCA infarct ASPECTS 8. History of diabetes, hypertension. Family history of stroke. Status post COVID-19 and PE in February. EXAM: CT ANGIOGRAPHY HEAD AND NECK CT PERFUSION BRAIN TECHNIQUE: Multidetector CT imaging of the head and neck was performed using the standard protocol during bolus administration of intravenous contrast. Multiplanar CT image reconstructions and MIPs were obtained to evaluate the vascular anatomy. Carotid stenosis measurements (when applicable) are obtained utilizing NASCET criteria, using the distal internal carotid diameter as the denominator. Multiphase CT imaging of the brain was performed following IV bolus contrast injection. Subsequent parametric perfusion maps were calculated using RAPID software. CONTRAST:  OMNIPAQUE IOHEXOL 350 MG/ML SOLN COMPARISON:  plain head CT  0929 hours today. FINDINGS: CT Brain Perfusion Findings: ASPECTS: 8 CBF (<30%) Volume: 16mL Perfusion (Tmax>6.0s) volume: 22mL.  Hypoperfusion index 0.5. Mismatch Volume: 6mL Infarction Location:Right insula, operculum, largely corresponding to the plain CT findings. CTA NECK Skeleton: No acute osseous abnormality identified. Upper chest: Negative aside from mild motion artifact. Other neck: Negative. Aortic arch: 3 vessel arch configuration.  No arch atherosclerosis. Right carotid system: Negative aside from a partially retropharyngeal course. Left carotid system: Negative. Vertebral arteries: Detail of the proximal right subclavian artery and right vertebral origin partially obscured by dense right subclavian venous contrast. The right V1 segment appears normal. The right vertebral is patent to the skull base with no plaque or stenosis identified. Normal proximal left subclavian artery and left vertebral artery origin.  Left vertebral artery is fairly codominant and within normal limits to the skull base. CTA HEAD Posterior circulation: Codominant distal vertebral arteries are within normal limits. Normal PICA origins and vertebrobasilar junction. Patent basilar artery without stenosis. AICA, SCA and PCA origins are within normal limits. Posterior communicating arteries are diminutive or absent. There is mild irregularity of the right PCA P2 segment (series 2, image 23. No significant stenosis. Otherwise bilateral PCA branches are within normal limits. Anterior circulation: Both ICA siphons are patent with no plaque or stenosis identified. Patent carotid termini. Normal MCA and ACA origins. Mildly dominant right A1. mild irregularity of the left A1 (series 10, image 22). Anterior communicating artery within normal limits. Other bilateral PCA branches are tortuous but within normal limits. Left MCA M1 segment and bifurcation are patent without stenosis. But thick MIP images on series 12 suggest mild irregularity of left MCA M2 and M3 branches. Right MCA M1 segment and right MCA trifurcation are patent without stenosis. No right MCA M2 branch occlusion or irregularity is identified. But a right M3 branch occlusion in the middle division is identified on series 12, image 14 and series 7 images 88 and 87. Other right  MCA branches appear within normal limits. Venous sinuses: Early contrast timing, not evaluated. Anatomic variants: Mildly dominant right A1. Review of the MIP images confirms the above findings IMPRESSION: 1. Negative for large vessel occlusion but positive for Right MCA M3 occlusion. CT Perfusion detects core infarct with minimally larger penumbra at the right insula/operculum corresponding to the plain CT finding. Preliminary report of the above These results were communicated to Dr. Iver Nestle at 1008 hours on 03/27/2020 by text page via the [facility] messaging system. 2. Additionally, mild vessel irregularity is noted in  multiple other circle-of-Willis branches, including the left A1, right P2, left M2. Although nonspecific this constellation of clinical and imaging findings might indicate accelerated branch vessel atherosclerosis. Although other large vessels appear normal, with no atherosclerosis in the neck or at the aortic arch. Electronically Signed   By: Odessa Fleming M.D.   On: 03/27/2020 10:24   MR ANGIO HEAD WO CONTRAST  Result Date: 03/27/2020 CLINICAL DATA:  Stroke follow-up. Slurred speech. Left-sided weakness. EXAM: MRI HEAD WITHOUT CONTRAST MRA HEAD WITHOUT CONTRAST TECHNIQUE: Multiplanar, multiecho pulse sequences of the brain and surrounding structures were obtained without intravenous contrast. Angiographic images of the head were obtained using MRA technique without contrast. COMPARISON:  Head CT, CTA, and CTP 03/27/2020 FINDINGS: MRI HEAD FINDINGS Brain: There is a moderate-sized acute right MCA territory infarct involving the posterior frontal lobe and insula with good correlation with the earlier CTP. there is a small amount of associated petechial hemorrhage without malignant hemorrhagic transformation. Additionally, there are subcentimeter infarcts involving cortex and white matter in the left parietal and left occipital lobes which are largely subacute in appearance and which also have a small amount of associated petechial hemorrhage. There is no intracranial mass effect or extra-axial fluid collection. The ventricles are normal in size. Vascular: Major intracranial vascular flow voids are preserved. Skull and upper cervical spine: Unremarkable bone marrow signal. Sinuses/Orbits: Unremarkable orbits. Paranasal sinuses and mastoid air cells are clear. Other: None. MRA HEAD FINDINGS The visualized distal vertebral arteries are widely patent to the basilar. Patent PICA, AICA, and SCA origins are seen bilaterally. The basilar artery is widely patent. Posterior communicating arteries are not clearly identified and  may be diminutive or absent. Both PCAs are patent without evidence of a significant proximal stenosis. The internal carotid arteries are widely patent from skull base to carotid termini. ACAs and MCAs are patent without evidence of a significant proximal stenosis. As seen on the earlier CTA, there is a right M3 branch occlusion with some distal reconstitution. The scattered areas of mild branch vessel irregularity involving anterior and posterior circulation elsewhere on CTA are not apparent on this MRA. No aneurysm is identified. IMPRESSION: 1. Moderate-sized acute right MCA infarct. 2. Small subacute left parieto-occipital infarcts. 3. Right M3 branch occlusion, otherwise negative head MRA. Electronically Signed   By: Sebastian Ache M.D.   On: 03/27/2020 17:45   MR BRAIN WO CONTRAST  Result Date: 03/27/2020 CLINICAL DATA:  Stroke follow-up. Slurred speech. Left-sided weakness. EXAM: MRI HEAD WITHOUT CONTRAST MRA HEAD WITHOUT CONTRAST TECHNIQUE: Multiplanar, multiecho pulse sequences of the brain and surrounding structures were obtained without intravenous contrast. Angiographic images of the head were obtained using MRA technique without contrast. COMPARISON:  Head CT, CTA, and CTP 03/27/2020 FINDINGS: MRI HEAD FINDINGS Brain: There is a moderate-sized acute right MCA territory infarct involving the posterior frontal lobe and insula with good correlation with the earlier CTP. there is a small amount of associated petechial hemorrhage without  malignant hemorrhagic transformation. Additionally, there are subcentimeter infarcts involving cortex and white matter in the left parietal and left occipital lobes which are largely subacute in appearance and which also have a small amount of associated petechial hemorrhage. There is no intracranial mass effect or extra-axial fluid collection. The ventricles are normal in size. Vascular: Major intracranial vascular flow voids are preserved. Skull and upper cervical spine:  Unremarkable bone marrow signal. Sinuses/Orbits: Unremarkable orbits. Paranasal sinuses and mastoid air cells are clear. Other: None. MRA HEAD FINDINGS The visualized distal vertebral arteries are widely patent to the basilar. Patent PICA, AICA, and SCA origins are seen bilaterally. The basilar artery is widely patent. Posterior communicating arteries are not clearly identified and may be diminutive or absent. Both PCAs are patent without evidence of a significant proximal stenosis. The internal carotid arteries are widely patent from skull base to carotid termini. ACAs and MCAs are patent without evidence of a significant proximal stenosis. As seen on the earlier CTA, there is a right M3 branch occlusion with some distal reconstitution. The scattered areas of mild branch vessel irregularity involving anterior and posterior circulation elsewhere on CTA are not apparent on this MRA. No aneurysm is identified. IMPRESSION: 1. Moderate-sized acute right MCA infarct. 2. Small subacute left parieto-occipital infarcts. 3. Right M3 branch occlusion, otherwise negative head MRA. Electronically Signed   By: Sebastian Ache M.D.   On: 03/27/2020 17:45   CT ABDOMEN PELVIS W CONTRAST  Result Date: 03/27/2020 CLINICAL DATA:  Flank pain. EXAM: CT ABDOMEN AND PELVIS WITH CONTRAST TECHNIQUE: Multidetector CT imaging of the abdomen and pelvis was performed using the standard protocol following bolus administration of intravenous contrast. CONTRAST:  60mL OMNIPAQUE IOHEXOL 300 MG/ML  SOLN COMPARISON:  CT pelvis 06/30/2019.  CT abdomen/pelvis 06/22/2019. FINDINGS: Lower chest: Relatively well-defined 2.2 cm lesion in the inferior lingula (image 4/series 5) measures water density. This may be loculated pleural fluid or intraparenchymal fluid collection in this patient with a history of pulmonary embolus and bilateral ground-glass airspace disease on the previous study suspicious for multifocal atypical/viral pneumonia. Heart size upper  normal. Hepatobiliary: No suspicious focal abnormality within the liver parenchyma. There is no evidence for gallstones, gallbladder wall thickening, or pericholecystic fluid. No intrahepatic or extrahepatic biliary dilation. Pancreas: No focal mass lesion. No dilatation of the main duct. No intraparenchymal cyst. No peripancreatic edema. Spleen: No splenomegaly. No focal mass lesion. Adrenals/Urinary Tract: No adrenal nodule or mass. New 18 mm hypoattenuating lesion identified upper pole right kidney with areas of subcapsular multifocal decreased enhancement in the lower pole right kidney. Left kidney unremarkable. No evidence for hydroureter. The urinary bladder appears normal for the degree of distention. Stomach/Bowel: Stomach is unremarkable. No gastric wall thickening. No evidence of outlet obstruction. Duodenum is normally positioned as is the ligament of Treitz. No small bowel wall thickening. No small bowel dilatation. The terminal ileum is normal. The appendix is normal. No gross colonic mass. No colonic wall thickening. Vascular/Lymphatic: No abdominal aortic aneurysm. No abdominal aortic atherosclerotic calcification. There is no gastrohepatic or hepatoduodenal ligament lymphadenopathy. No retroperitoneal or mesenteric lymphadenopathy. No pelvic sidewall lymphadenopathy. Reproductive: The uterus is unremarkable.  There is no adnexal mass. Other: No intraperitoneal free fluid. Musculoskeletal: No worrisome lytic or sclerotic osseous abnormality. IMPRESSION: 1. New 18 mm hypoattenuating lesion upper pole right kidney with areas of subcapsular multifocal decreased enhancement in the lower pole right kidney. Imaging features are nonspecific and could be related to pyelonephritis and phlegmon/evolving abscess in the upper pole right  kidney cannot be excluded. Alternatively, multifocal right renal infarct could have this appearance. Correlation with urinalysis may prove helpful. 2. Relatively well-defined 2.2  cm lesion in the inferior lingula measures water density. This may be loculated pleural fluid or intraparenchymal fluid collection in this patient with a history of pulmonary embolus and bilateral ground-glass airspace disease on the previous study suspicious for multifocal atypical/viral pneumonia. Consider follow-up to ensure resolution. Electronically Signed   By: Kennith Center M.D.   On: 03/27/2020 12:03   CT Code Stroke Cerebral Perfusion with contrast  Result Date: 03/27/2020 CLINICAL DATA:  39 year old female code stroke presentation. Slurred speech. Plain head CT suspicious for right MCA infarct ASPECTS 8. History of diabetes, hypertension. Family history of stroke. Status post COVID-19 and PE in February. EXAM: CT ANGIOGRAPHY HEAD AND NECK CT PERFUSION BRAIN TECHNIQUE: Multidetector CT imaging of the head and neck was performed using the standard protocol during bolus administration of intravenous contrast. Multiplanar CT image reconstructions and MIPs were obtained to evaluate the vascular anatomy. Carotid stenosis measurements (when applicable) are obtained utilizing NASCET criteria, using the distal internal carotid diameter as the denominator. Multiphase CT imaging of the brain was performed following IV bolus contrast injection. Subsequent parametric perfusion maps were calculated using RAPID software. CONTRAST:  OMNIPAQUE IOHEXOL 350 MG/ML SOLN COMPARISON:  plain head CT  0929 hours today. FINDINGS: CT Brain Perfusion Findings: ASPECTS: 8 CBF (<30%) Volume: 70mL Perfusion (Tmax>6.0s) volume: 70mL.  Hypoperfusion index 0.5. Mismatch Volume: 68mL Infarction Location:Right insula, operculum, largely corresponding to the plain CT findings. CTA NECK Skeleton: No acute osseous abnormality identified. Upper chest: Negative aside from mild motion artifact. Other neck: Negative. Aortic arch: 3 vessel arch configuration.  No arch atherosclerosis. Right carotid system: Negative aside from a partially  retropharyngeal course. Left carotid system: Negative. Vertebral arteries: Detail of the proximal right subclavian artery and right vertebral origin partially obscured by dense right subclavian venous contrast. The right V1 segment appears normal. The right vertebral is patent to the skull base with no plaque or stenosis identified. Normal proximal left subclavian artery and left vertebral artery origin. Left vertebral artery is fairly codominant and within normal limits to the skull base. CTA HEAD Posterior circulation: Codominant distal vertebral arteries are within normal limits. Normal PICA origins and vertebrobasilar junction. Patent basilar artery without stenosis. AICA, SCA and PCA origins are within normal limits. Posterior communicating arteries are diminutive or absent. There is mild irregularity of the right PCA P2 segment (series 2, image 23. No significant stenosis. Otherwise bilateral PCA branches are within normal limits. Anterior circulation: Both ICA siphons are patent with no plaque or stenosis identified. Patent carotid termini. Normal MCA and ACA origins. Mildly dominant right A1. mild irregularity of the left A1 (series 10, image 22). Anterior communicating artery within normal limits. Other bilateral PCA branches are tortuous but within normal limits. Left MCA M1 segment and bifurcation are patent without stenosis. But thick MIP images on series 12 suggest mild irregularity of left MCA M2 and M3 branches. Right MCA M1 segment and right MCA trifurcation are patent without stenosis. No right MCA M2 branch occlusion or irregularity is identified. But a right M3 branch occlusion in the middle division is identified on series 12, image 14 and series 7 images 88 and 87. Other right MCA branches appear within normal limits. Venous sinuses: Early contrast timing, not evaluated. Anatomic variants: Mildly dominant right A1. Review of the MIP images confirms the above findings IMPRESSION: 1. Negative for  large vessel occlusion but positive for Right MCA M3 occlusion. CT Perfusion detects core infarct with minimally larger penumbra at the right insula/operculum corresponding to the plain CT finding. Preliminary report of the above These results were communicated to Dr. Iver Nestle at 1008 hours on 03/27/2020 by text page via the [facility] messaging system. 2. Additionally, mild vessel irregularity is noted in multiple other circle-of-Willis branches, including the left A1, right P2, left M2. Although nonspecific this constellation of clinical and imaging findings might indicate accelerated branch vessel atherosclerosis. Although other large vessels appear normal, with no atherosclerosis in the neck or at the aortic arch. Electronically Signed   By: Odessa Fleming M.D.   On: 03/27/2020 10:24   ECHOCARDIOGRAM COMPLETE BUBBLE STUDY  Result Date: 03/27/2020    ECHOCARDIOGRAM REPORT   Patient Name:   Shelly Shields Date of Exam: 03/27/2020 Medical Rec #:  [id number]        Height:       61.0 in Accession #:    [id number]       Weight:       204.0 lb Date of Birth:  17-May-1981        BSA:          1.905 m Patient Age:    39 years         BP:           135/87 mmHg Patient Gender: F                HR:           100 bpm. Exam Location:  Inpatient Procedure: 2D Echo, Cardiac Doppler, Color Doppler and Intracardiac            Opacification Agent Indications:    Stroke 434.91 / I63.9  History:        Patient has prior history of Echocardiogram examinations, most                 recent 06/30/2019. Risk Factors:Hypertension, Diabetes and Sleep                 Apnea. COVID-19.  Sonographer:    Elmarie Shiley Dance Referring Phys: [id number] SRISHTI L BHAGAT IMPRESSIONS  1. Left ventricular ejection fraction, by estimation, is 20 to 25%. The left ventricle has severely decreased function. There is severe hypokinesis of the basal-to-apical anteroseptum, inferoseptum and anterior LV walls . Left ventricular diastolic parameters are consistent with Grade II  diastolic dysfunction (pseudonormalization).  2. Right ventricular systolic function is normal. The right ventricular size is normal.  3. Left atrial size was moderately dilated.  4. The mitral valve is normal in structure. Trivial mitral valve regurgitation.  5. The aortic valve is tricuspid. There is mild thickening of the aortic valve. Aortic valve regurgitation is not visualized.  6. The inferior vena cava is normal in size with greater than 50% respiratory variability, suggesting right atrial pressure of 3 mmHg.  7. Agitated saline contrast bubble study was negative, with no evidence of any interatrial shunt. Comparison(s): Compared to prior echo in 06/2019, the LVEF is now severely reduced to 20-25% with severe hypokinesis of the inferoseptal, anteroseptal and anterior LV walls. Conclusion(s)/Recommendation(s): No intracardiac source of embolism detected on this transthoracic study. A transesophageal echocardiogram is recommended to exclude cardiac source of embolism if clinically indicated. FINDINGS  Left Ventricle: Left ventricular ejection fraction, by estimation, is 20 to 25%. The left ventricle has severely decreased function. There is severe hypokinesis of the basal-to-apical anteroseptum,  inferoseptum and anterior LV walls. Definity contrast agent was given IV to delineate the left ventricular endocardial borders. The left ventricular internal cavity size was normal in size. There is no left ventricular hypertrophy. Left ventricular diastolic parameters are consistent with Grade II diastolic  dysfunction (pseudonormalization). Right Ventricle: The right ventricular size is normal. No increase in right ventricular wall thickness. Right ventricular systolic function is normal. Left Atrium: Left atrial size was moderately dilated. Right Atrium: Right atrial size was normal in size. Pericardium: There is no evidence of pericardial effusion. Mitral Valve: The mitral valve is normal in structure. There is  mild thickening of the mitral valve leaflet(s). Mild mitral annular calcification. Trivial mitral valve regurgitation. Tricuspid Valve: The tricuspid valve is normal in structure. Tricuspid valve regurgitation is trivial. Aortic Valve: The aortic valve is tricuspid. There is mild thickening of the aortic valve. Aortic valve regurgitation is not visualized. Pulmonic Valve: The pulmonic valve was normal in structure. Pulmonic valve regurgitation is trivial. Aorta: The aortic root and ascending aorta are structurally normal, with no evidence of dilitation. Venous: The inferior vena cava is normal in size with greater than 50% respiratory variability, suggesting right atrial pressure of 3 mmHg. IAS/Shunts: No atrial level shunt detected by color flow Doppler. Agitated saline contrast was given intravenously to evaluate for intracardiac shunting. Agitated saline contrast bubble study was negative, with no evidence of any interatrial shunt.  LEFT VENTRICLE PLAX 2D LVIDd:         5.00 cm LVIDs:         4.50 cm LV PW:         1.30 cm LV IVS:        0.80 cm LVOT diam:     1.80 cm LV SV:         31 LV SV Index:   16 LVOT Area:     2.54 cm  RIGHT VENTRICLE             IVC RV Basal diam:  2.40 cm     IVC diam: 1.70 cm RV S prime:     13.20 cm/s TAPSE (M-mode): 1.7 cm LEFT ATRIUM             Index       RIGHT ATRIUM           Index LA diam:        4.50 cm 2.36 cm/m  RA Area:     14.10 cm LA Vol (A2C):   75.3 ml 39.53 ml/m RA Volume:   33.40 ml  17.54 ml/m LA Vol (A4C):   87.2 ml 45.78 ml/m LA Biplane Vol: 81.2 ml 42.63 ml/m  AORTIC VALVE LVOT Vmax:   74.20 cm/s LVOT Vmean:  49.200 cm/s LVOT VTI:    0.122 m  AORTA Ao Root diam: 2.90 cm Ao Asc diam:  2.80 cm MITRAL VALVE MV Area (PHT): 3.31 cm     SHUNTS MV Decel Time: 229 msec     Systemic VTI:  0.12 m MV E velocity: 120.00 cm/s  Systemic Diam: 1.80 cm MV A velocity: 85.90 cm/s MV E/A ratio:  1.40 Laurance Flatten MD Electronically signed by Laurance Flatten MD  Signature Date/Time: 03/27/2020/4:25:55 PM    Final    CT HEAD CODE STROKE WO CONTRAST  Result Date: 03/27/2020 CLINICAL DATA:  Code stroke.  Slurred speech. EXAM: CT HEAD WITHOUT CONTRAST TECHNIQUE: Contiguous axial images were obtained from the base of the skull through the vertex without intravenous contrast. COMPARISON:  None. FINDINGS: Brain: There is loss of gray-white differentiation and subtle hypoattenuation involving the right insula and the right posterior frontal cortex and underlying white matter. Mild sulcal effacement without substantial mass effect. No midline shift. Basal cisterns are patent. No acute hemorrhage. No hydrocephalus. No mass lesion. Vascular: No definite hyperdense vessel identified. Skull: No acute fracture. Sinuses/Orbits: No acute finding. Other: No mastoid effusions. ASPECTS [facility] Stroke Program Early CT Score) - Ganglionic level infarction (caudate, lentiform nuclei, internal capsule, insula, M1-M3 cortex): 6 - Supraganglionic infarction (M4-M6 cortex): 2 Total score (0-10 with 10 being normal): 8 IMPRESSION: 1. Findings concerning for acute or early subacute infarct involving the right insula and posterior frontal lobe (MCA territory). ASPECTS is 8. 2. No substantial mass effect or acute hemorrhage. Code stroke imaging results were communicated on 03/27/2020 at 9:38 am to provider Dr. Iver Nestle Via telephone, who verbally acknowledged these results. Electronically Signed   By: Feliberto Harts MD   On: 03/27/2020 09:44     Scheduled Meds:   . aspirin  300 mg Rectal Daily   Or  . aspirin  325 mg Oral Daily  . budesonide  0.25 mg Inhalation BID  . enoxaparin (LOVENOX) injection  40 mg Subcutaneous Q24H  . escitalopram  10 mg Oral Daily  . insulin aspart  0-15 Units Subcutaneous TID WC  . insulin aspart  0-5 Units Subcutaneous QHS  . insulin glargine  80 Units Subcutaneous BID  . nortriptyline  10 mg Oral QHS  . pantoprazole  40 mg Oral Daily  . rosuvastatin  20  mg Oral q1800  . topiramate  200 mg Oral QHS    Continuous Infusions:   . sodium chloride 50 mL/hr at 03/27/20 1442     LOS: 1 day     Marcellus Scott, MD, [place], [facility]. [facility]    To contact the attending provider between 7A-7P or the covering provider during after hours 7P-7A, please log into the web site [email or web address] and access using universal  password for that web site. If you do not have the password, please call the hospital operator.  03/28/2020, 4:25 PM

## 2020-03-28 NOTE — Interval H&P Note (Signed)
History and Physical Interval Note:  03/28/2020 1:20 PM  Shelly Shields  has presented today for surgery, with the diagnosis of stroke.  The various methods of treatment have been discussed with the patient and family. After consideration of risks, benefits and other options for treatment, the patient has consented to  Procedure(s): TRANSESOPHAGEAL ECHOCARDIOGRAM (TEE) (N/A) as a surgical intervention.  The patient's history has been reviewed, patient examined, no change in status, stable for surgery.  I have reviewed the patient's chart and labs.  Questions were answered to the patient's satisfaction.     Yates Decamp

## 2020-03-28 NOTE — Progress Notes (Signed)
STROKE TEAM PROGRESS NOTE   INTERVAL HISTORY Father at the bedside. Pt sitting in bed, just come back from TEE with Dr. Jacinto Halim. There is no mural thrombus, but confirmed low EF at 20%. Agree with Dr. Jacinto Halim for anticoagulation. Given moderate sized right MCA infarct, will start AC in 5 days post stroke.   Vitals:   03/28/20 1226 03/28/20 1345 03/28/20 1355 03/28/20 1525  BP: (!) 144/95 126/85 127/74 (!) 131/92  Pulse: 96 99 93 96  Resp: (!) 23 (!) 24 (!) 22 16  Temp: 97.8 F (36.6 C) (!) 97.4 F (36.3 C)  98.9 F (37.2 C)  TempSrc: Oral Axillary  Oral  SpO2: 98% 97% 91% 100%  Weight: 90.7 kg     Height: 5\' 1"  (1.549 m)      CBC:  Recent Labs  Lab 03/27/20 0946 03/27/20 1002  WBC 11.7*  --   NEUTROABS 9.7*  --   HGB 12.8 12.9  HCT 39.3 38.0  MCV 86.6  --   PLT 195  --    Basic Metabolic Panel:  Recent Labs  Lab 03/27/20 0946 03/27/20 1002  NA 133* 137  K 3.6 3.7  CL 104 103  CO2 21*  --   GLUCOSE 373* 363*  BUN 5* 7  CREATININE 0.88 0.70  CALCIUM 8.4*  --    Lipid Panel:  Recent Labs  Lab 03/28/20 0415  CHOL 176  TRIG 106  HDL 29*  CHOLHDL 6.1  VLDL 21  LDLCALC 13/02/21*   HgbA1c:  Recent Labs  Lab 03/27/20 0946  HGBA1C 11.2*   Urine Drug Screen:  Recent Labs  Lab 03/27/20 1024  LABOPIA NONE DETECTED  COCAINSCRNUR NONE DETECTED  LABBENZ NONE DETECTED  AMPHETMU NONE DETECTED  THCU NONE DETECTED  LABBARB NONE DETECTED    Alcohol Level  Recent Labs  Lab 03/27/20 0946  ETH <10    IMAGING past 24 hours No results found. TEE 03/28/2020 LV: Dilated, global hypokinesis, EF 20%.  No mural thrombus. RV: Normal. Left atrium: Mildly dilated.  There is a small PFO with right to left shunting, weakly positive shunting. Left atrial appendage: Normal.  No thrombus.  Normal function. Right atrium: Normal. Mitral valve: Normal.  Trace MR. Tricuspid valve: Normal.  Trace TR. Pulmonary valve: Normal. Aortic valve: Normal.  No aortic  regurgitation. Aorta: Normal.  No dissection. Impression: No obvious cause for cardiac source of cerebral emboli, weakly positive left-to-right shunting by contrast study and also severe LV systolic dysfunction without mural thrombus.  Left atrial appendage is normal and thoracic aorta is normal.   PHYSICAL EXAM  Temp:  [97.4 F (36.3 C)-98.9 F (37.2 C)] 98.9 F (37.2 C) (11/02 1525) Pulse Rate:  [93-110] 96 (11/02 1525) Resp:  [16-24] 16 (11/02 1525) BP: (115-158)/(74-106) 131/92 (11/02 1525) SpO2:  [91 %-100 %] 100 % (11/02 1525) Weight:  [90.7 kg] 90.7 kg (11/02 1226)  General - Well nourished, well developed, in no apparent distress, mildly lethargic.  Ophthalmologic - fundi not visualized due to noncooperation.  Cardiovascular - Regular rhythm and rate.  Mental Status -  Level of arousal and orientation to time, place, and person were intact. Language including expression, naming, repetition, comprehension was assessed and found intact.  Moderate dysarthria Fund of Knowledge was assessed and was intact.  Cranial Nerves II - XII - II - Visual field intact OU. III, IV, VI - Extraocular movements intact. V - Facial sensation intact bilaterally. VII - left facial droop. VIII - Hearing &  vestibular intact bilaterally. X - Palate elevates symmetrically.  Moderate dysarthria XI - Chin turning & shoulder shrug intact bilaterally. XII - Tongue protrusion to the left.  Motor Strength - The patient's strength was normal in all extremities and pronator drift was absent.  Bulk was normal and fasciculations were absent.   Motor Tone - Muscle tone was assessed at the neck and appendages and was normal.  Reflexes - The patient's reflexes were symmetrical in all extremities and she had no pathological reflexes.  Sensory - Light touch, temperature/pinprick were assessed and were symmetrical.    Coordination - The patient had normal movements in the hands with no ataxia or dysmetria.   Tremor was absent.  Gait and Station - deferred.   ASSESSMENT/PLAN [name] is a 39 y.o. female with history of type 2 diabetes, hypertension, hyperlipidemia, obesity, sleep apnea, migraines (not hemiplegic), asthma, COVID-19 (January 2021, complicated by PE in February, no longer on anticoagulation) presenting with slurred speech in setting of R flank pain, nausea, diarrhea and chills.   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 ASPECTS 8    CTA head & neck no LVO. R M3 occlusion. Mild atherosclerosis L A1, R P2, L M2.  CT perfusion core infarct slightly larger than R insular and posterior frontal lobe infarct seen on CT.   MRI  Moderate R MCA infarct. Sub subacute L parieto-occipital infarcts.   MRA  R M3 branch occlusion   2D Echo EF 20-25%. Severe HK. LA moderately dilated. No source of embolus   TEE 11/2 no SOE seen, weak L to R shunt. Severe LV systolic dysfunction w/o thrombus. LAA nml. Thoracic aorta nml.   Hypercoagulable labs pending   LDL 126  HgbA1c 11.2  VTE prophylaxis - Lovenox 40 mg sq daily   Eliquis (apixaban) daily prior to admission, now on aspirin 325 mg daily. Agree with Dr. Jacinto Halim for anticoagulation. Given moderate sized infarct, recommend to start [facility] on 11/5 - 11/6 to avoid hemorrhagic conversion. Once AC started, no ASA needed from neuro standpoint.  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 Jacinto Halim  History of a Covid infection  COVID-19 long hauler - hospitalized 05/2019 and treated w/ Remdesivir and steroids. Persistent cough and fatique.  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 Long-term BP goal normotensive  Hyperlipidemia  Home meds:  crestor 10  Now on crestor 20  LDL 126, goal < 70  Continue statin at discharge  Diabetes type II Uncontrolled  HgbA1c 11.2, goal < 7.0  CBGs  SSI  DM education  On Lantus  Close PCP follow-up for better DM control  Other Stroke Risk Factors  ETOH use, alcohol level <10, advised to drink no more than 1 drink(s) a day  Obesity, Body mass index is 37.79 kg/m., BMI >/= 30 associated with increased stroke risk, recommend weight loss, diet and exercise as appropriate   Family hx stroke (father and on maternal side of family too)  Migraines  obstructive sleep apnea on CPAP, followed by Dr. Vickey Huger at [facility]   Other Active Problems  Asthma   Hospital day # 1   Marvel Plan, MD PhD Stroke Neurology 03/28/2020 7:22 PM    To contact Stroke Continuity provider, please refer to [email or web address]. After hours, contact General Neurology

## 2020-03-28 NOTE — Evaluation (Signed)
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 PMH HTN HLD DM2 obese OSA on cpap COVID with pulmonary embolism vaculitis   Clinical Impression   PT admitted with L side weakness and CVA (+ MRI). Pt currently with functional limitiations due to the deficits listed below (see OT problem list). Pt currently requires min (A) for most adls and decreased balance. Pt with expressive deficits and reports typing incorrect spelling on cell phone texting.  Pt will benefit from skilled OT to increase their independence and safety with adls and balance to allow discharge CIR to reach MOD I as she was working prior to this event.     Follow Up Recommendations  CIR    Equipment Recommendations  None recommended by OT    Recommendations for Other Services Rehab consult     Precautions / Restrictions Precautions Precautions: Fall      Mobility Bed Mobility               General bed mobility comments: oob in chair on arrival    Transfers Overall transfer level: Needs assistance Equipment used: None Transfers: Sit to/from Stand Sit to Stand: Min guard         General transfer comment: min-guard A for safety while pt gained balance    Balance Overall balance assessment: Needs assistance Sitting-balance support: Feet supported;No upper extremity supported Sitting balance-Leahy Scale: Good     Standing balance support: No upper extremity supported Standing balance-Leahy Scale: Fair                             ADL either performed or assessed with clinical judgement   ADL Overall ADL's : Needs assistance/impaired     Grooming: Oral care;Min guard;Standing Grooming Details (indicate cue type and reason): poor lip closure with draingage  from mouth coughing with rinsing  Upper Body Bathing: Min guard   Lower Body Bathing: Minimal assistance   Upper Body Dressing : Min guard   Lower Body Dressing: Minimal assistance   Toilet Transfer: Data processing manager and Hygiene: Min guard       Functional mobility during ADLs: Min guard General ADL Comments: pt demonstrates tearful looking in mirror during session . pt with facial drrop and poor lip closure. pt coughing during oral care and concerns for aspirations     Vision Baseline Vision/History: Wears glasses Wears Glasses: At all times Additional Comments: denies changes to be further assessed     Perception Perception Perception Tested?: Yes Perception Deficits: Inattention/neglect Inattention/Neglect: Does not attend to left visual field   Praxis      Pertinent Vitals/Pain Pain Assessment: Faces Faces Pain Scale: Hurts even more Pain Location: Arch of L foot (pre existing) Pain Descriptors / Indicators: Aching     Hand Dominance Right   Extremity/Trunk Assessment Upper Extremity Assessment Upper Extremity Assessment: LUE deficits/detail LUE Deficits / Details: decreased fine motor   Lower Extremity Assessment Lower Extremity Assessment: Defer to PT evaluation   Cervical / Trunk Assessment Cervical / Trunk Assessment: Normal   Communication Communication Communication: Expressive difficulties   Cognition Arousal/Alertness: Awake/alert Behavior During Therapy: WFL for tasks assessed/performed (emotional, tearful) Overall Cognitive Status: Within Functional Limits for tasks assessed  General Comments: demonstrated decreased attention to L side during mobility    General Comments       Exercises     Shoulder Instructions      Home Living Family/patient expects to be discharged to:: Private residence Living Arrangements: Other relatives Available Help at  Discharge: Family;Available PRN/intermittently Type of Home: House Home Access: Ramped entrance     Home Layout: Two level;Able to live on main level with bedroom/bathroom     Bathroom Shower/Tub: Chief Strategy Officer: Standard     Home Equipment: Gilmer Mor - single point   Additional Comments: lives with cousin and her children      Prior Functioning/Environment Level of Independence: Independent        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[employer]        OT Problem List: Decreased strength;Decreased activity tolerance;Impaired balance (sitting and/or standing);Decreased safety awareness;Decreased knowledge of use of DME or AE;Decreased knowledge of precautions      OT Treatment/Interventions: Self-care/ADL training;Therapeutic exercise;Energy conservation;Neuromuscular education;DME and/or AE instruction;Manual therapy;Therapeutic activities;Cognitive remediation/compensation;Patient/family education;Balance training    OT Goals(Current goals can be found in the care plan section) Acute Rehab OT Goals Patient Stated Goal: return to home and work OT Goal Formulation: With patient Time For Goal Achievement: 04/11/20 Potential to Achieve Goals: Good  OT Frequency: Min 3X/week   Barriers to D/C:            Co-evaluation              AM-PAC OT "6 Clicks" Daily Activity     Outcome Measure Help from another person eating meals?: A Little Help from another person taking care of personal grooming?: A Little Help from another person toileting, which includes using toliet, bedpan, or urinal?: A Little Help from another person bathing (including washing, rinsing, drying)?: A Little Help from another person to put on and taking off regular upper body clothing?: A Little Help from another person to put on and taking off regular lower body clothing?: A Little 6 Click Score: 18   End of Session Nurse Communication: Mobility  status;Precautions  Activity Tolerance: Patient tolerated treatment well Patient left: in chair;with call bell/phone within reach;with chair alarm set  OT Visit Diagnosis: Unsteadiness on feet (R26.81);Muscle weakness (generalized) (M62.81)                Time: 7341-9379 OT Time Calculation (min): 22 min Charges:  OT General Charges $OT Visit: 1 Visit OT Evaluation $OT Eval Moderate Complexity: 1 Mod   Brynn, OTR/L  Acute Rehabilitation Services Pager: [phone] Office: [phone] .   Mateo Flow 03/28/2020, 5:29 PM

## 2020-03-28 NOTE — CV Procedure (Signed)
Procedure performed: Transesophageal echocardiogram under MAC  Indication: Patient presenting with embolic CVA, TEE being performed to evaluate for cardiac source of cerebral emboli.  She was also found to have severe LV systolic dysfunction by echocardiogram.  LV: Dilated, global hypokinesis, EF 20%.  No mural thrombus. RV: Normal. Left atrium: Mildly dilated.  There is a small PFO with right to left shunting, weakly positive shunting. Left atrial appendage: Normal.  No thrombus.  Normal function. Right atrium: Normal. Mitral valve: Normal.  Trace MR. Tricuspid valve: Normal.  Trace TR. Pulmonary valve: Normal. Aortic valve: Normal.  No aortic regurgitation. Aorta: Normal.  No dissection.  Impression: No obvious cause for cardiac source of cerebral emboli, weakly positive left-to-right shunting by contrast study and also severe LV systolic dysfunction without mural thrombus.  Left atrial appendage is normal and thoracic aorta is normal.   Adrian Prows, MD, [facility] 03/28/2020, 1:41 PM Office: [phone] Pager: [phone]

## 2020-03-28 NOTE — Progress Notes (Signed)
I attempted ot see pt for CIR today x2- she was gone both times- will have consult done in AM/tomorrow

## 2020-03-28 NOTE — Evaluation (Signed)
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 PMH HTN HLD DM2 obese OSA on cpap COVID with pulmonary embolism vaculitis  Clinical Impression  Pt admitted with above diagnosis. Pt presents with mildly decreased attention to L side with mobility as well as L toe drag, needing min A during ambulation to prevent fall, especially when turning. Pt worked full time as Conservation officer, historic buildings, has been using a SPC for community distances due to SOB since covid. Noted 2/4 DOE on eval. Would benefot from challenging rehab environment to return to independence.  Pt currently with functional limitations due to the deficits listed below (see PT Problem List). Pt will benefit from skilled PT to increase their independence and safety with mobility to allow discharge to the venue listed below.       Follow Up Recommendations CIR    Equipment Recommendations  None recommended by PT    Recommendations for Other Services       Precautions / Restrictions Precautions Precautions: Fall Restrictions Weight Bearing Restrictions: No      Mobility  Bed Mobility Overal bed mobility: Needs Assistance Bed Mobility: Supine to Sit     Supine to sit: Supervision     General bed mobility comments: increased time, first time pt had been up    Transfers Overall transfer level: Needs assistance Equipment used: None Transfers: Sit to/from Stand Sit to Stand: Min guard         General transfer comment: min-guard A for safety while pt gained balance  Ambulation/Gait Ambulation/Gait assistance: Min assist Gait Distance (Feet): 40 Feet Assistive device: IV Pole Gait Pattern/deviations: Step-through pattern;Decreased weight shift to left;Decreased dorsiflexion - left Gait velocity:  decreased Gait velocity interpretation: <1.8 ft/sec, indicate of risk for recurrent falls General Gait Details: pt caught L toe on floor several times and L shoulder against doorway when leaving room. Had some difficulty turning around, min A for fall prevention.   Stairs            Wheelchair Mobility    Modified Rankin (Stroke Patients Only) Modified Rankin (Stroke Patients Only) Pre-Morbid Rankin Score: No significant disability Modified Rankin: Moderately severe disability     Balance Overall balance assessment: Needs assistance Sitting-balance support: Feet supported;No upper extremity supported Sitting balance-Leahy Scale: Good Sitting balance - Comments: pt able to reach to feet to put socks on but had difficulty maintaining balance when going to RLE, partly because sock kept sliding out of L hand   Standing balance support: No upper extremity supported Standing balance-Leahy Scale: Fair                               Pertinent Vitals/Pain Pain Assessment: Faces Faces Pain Scale: Hurts even more Pain Location: Arch of L foot (pre existing) Pain Descriptors / Indicators: Aching Pain Intervention(s): Limited activity within patient's tolerance;Monitored during session    Home Living Family/patient expects to be discharged to:: Private residence Living Arrangements: Other relatives Available Help at Discharge: Family;Available PRN/intermittently Type of Home: House Home Access: Ramped entrance     Home Layout: Two level;Able to live on main level with bedroom/bathroom Home Equipment: Gilmer Mor - single point Additional Comments: lives with cousin and her children    Prior Function Level of Independence:  Independent         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[employer]        Extremity/Trunk Assessment   Upper Extremity Assessment Upper Extremity Assessment: Defer to OT evaluation    Lower Extremity  Assessment Lower Extremity Assessment: LLE deficits/detail LLE Deficits / Details: grossly 4/5 hip flex and knee ext compared to 5/5 RLE LLE Sensation: decreased proprioception LLE Coordination: decreased gross motor    Cervical / Trunk Assessment Cervical / Trunk Assessment: Normal  Communication   Communication: Expressive difficulties  Cognition Arousal/Alertness: Awake/alert Behavior During Therapy: WFL for tasks assessed/performed (emotional, tearful) Overall Cognitive Status: Within Functional Limits for tasks assessed                                 General Comments: demonstrated decreased attention to L side during mobility       General Comments General comments (skin integrity, edema, etc.): 2/4 DOE with ambulation (this is why she has been using SPC since covid). pt with significant word finding problems and slurred speech as well as facial droop. Pt tearful about current situation and all of the health problems she has faced this past year    Exercises     Assessment/Plan    PT Assessment Patient needs continued PT services  PT Problem List Decreased strength;Decreased activity tolerance;Decreased balance;Decreased mobility;Decreased coordination;Pain;Cardiopulmonary status limiting activity       PT Treatment Interventions DME instruction;Gait training;Stair training;Functional mobility training;Therapeutic activities;Therapeutic exercise;Balance training;Neuromuscular re-education;Patient/family education    PT Goals (Current goals can be found in the Care Plan section)  Acute Rehab PT Goals Patient Stated Goal: return to home and work PT Goal Formulation: With patient Time For Goal Achievement: 04/11/20 Potential to Achieve Goals: Good Additional Goals Additional Goal #1: Pt to perform >19 on DGI    Frequency Min 4X/week   Barriers to discharge        Co-evaluation               AM-PAC PT "6 Clicks" Mobility  Outcome Measure Help  needed turning from your back to your side while in a flat bed without using bedrails?: None Help needed moving from lying on your back to sitting on the side of a flat bed without using bedrails?: None Help needed moving to and from a bed to a chair (including a wheelchair)?: A Little Help needed standing up from a chair using your arms (e.g., wheelchair or bedside chair)?: A Little Help needed to walk in hospital room?: A Little Help needed climbing 3-5 steps with a railing? : A Lot 6 Click Score: 19    End of Session Equipment Utilized During Treatment: Gait belt Activity Tolerance: Patient tolerated treatment well Patient left: in chair;with call bell/phone within reach;with chair alarm set Nurse Communication: Mobility status PT Visit Diagnosis: Unsteadiness on feet (R26.81);Hemiplegia and hemiparesis;Pain;Difficulty in walking, not elsewhere classified (R26.2) Hemiplegia - Right/Left: Left Hemiplegia - dominant/non-dominant: Non-dominant Hemiplegia - caused by: Cerebral infarction Pain - Right/Left: Left Pain - part of body: Ankle and joints of foot    Time: 7829-5621 PT Time Calculation (min) (ACUTE ONLY): 37 min   Charges:   PT Evaluation $PT Eval Moderate Complexity: 1 Mod PT Treatments $Gait Training: 8-22 mins        Lyanne Co, PT  Acute Rehab Services  Pager [phone] Office [phone]   [place] L   03/28/2020, 12:11 PM

## 2020-03-28 NOTE — Anesthesia Postprocedure Evaluation (Signed)
Anesthesia Post Note  Patient: Shelly Shields  Procedure(s) Performed: TRANSESOPHAGEAL ECHOCARDIOGRAM (TEE) (N/A )     Patient location during evaluation: PACU Anesthesia Type: MAC Level of consciousness: awake and alert Pain management: pain level controlled Vital Signs Assessment: post-procedure vital signs reviewed and stable Respiratory status: spontaneous breathing and respiratory function stable Cardiovascular status: stable Postop Assessment: no apparent nausea or vomiting Anesthetic complications: no   No complications documented.  Last Vitals:  Vitals:   03/28/20 1345 03/28/20 1355  BP: 126/85 127/74  Pulse: 99 93  Resp: (!) 24 (!) 22  Temp: (!) 36.3 C   SpO2: 97% 91%    Last Pain:  Vitals:   03/28/20 1416  TempSrc:   PainSc: 0-No pain                 Merlinda Frederick

## 2020-03-28 NOTE — Transfer of Care (Signed)
Immediate Anesthesia Transfer of Care Note  Patient: Shelly Shields  Procedure(s) Performed: TRANSESOPHAGEAL ECHOCARDIOGRAM (TEE) (N/A )  Patient Location: Endoscopy Unit  Anesthesia Type:MAC  Level of Consciousness: awake, alert  and sedated  Airway & Oxygen Therapy: Patient connected to nasal cannula oxygen  Post-op Assessment: Post -op Vital signs reviewed and stable  Post vital signs: stable  Last Vitals:  Vitals Value Taken Time  BP    Temp    Pulse    Resp    SpO2      Last Pain:  Vitals:   03/28/20 1226  TempSrc: Oral  PainSc: 0-No pain         Complications: No complications documented.

## 2020-03-28 NOTE — Progress Notes (Signed)
  Echocardiogram Echocardiogram Transesophageal has been performed.  Shelly Shields M 03/28/2020, 2:06 PM

## 2020-03-28 NOTE — Progress Notes (Signed)
Pt oriented to room. Call bell in reach. Bed locked and alarm placed. Tele placed. Pt is NPO. Pt is on RA. Skin assessed. No c/o pain. No numbness or tingling to extremities.

## 2020-03-28 NOTE — Plan of Care (Signed)
°  Problem: Education: °Goal: Knowledge of disease or condition will improve °Outcome: Progressing °Goal: Knowledge of secondary prevention will improve °Outcome: Progressing °Goal: Knowledge of patient specific risk factors addressed and post discharge goals established will improve °Outcome: Progressing °Goal: Individualized Educational Video(s) °Outcome: Progressing °  °Problem: Coping: °Goal: Will verbalize positive feelings about self °Outcome: Progressing °Goal: Will identify appropriate support needs °Outcome: Progressing °  °Problem: Health Behavior/Discharge Planning: °Goal: Ability to manage health-related needs will improve °Outcome: Progressing °  °Problem: Self-Care: °Goal: Ability to participate in self-care as condition permits will improve °Outcome: Progressing °Goal: Verbalization of feelings and concerns over difficulty with self-care will improve °Outcome: Progressing °Goal: Ability to communicate needs accurately will improve °Outcome: Progressing °  °Problem: Nutrition: °Goal: Risk of aspiration will decrease °Outcome: Progressing °Goal: Dietary intake will improve °Outcome: Progressing °  °Problem: Intracerebral Hemorrhage Tissue Perfusion: °Goal: Complications of Intracerebral Hemorrhage will be minimized °Outcome: Progressing °  °Problem: Ischemic Stroke/TIA Tissue Perfusion: °Goal: Complications of ischemic stroke/TIA will be minimized °Outcome: Progressing °  °

## 2020-03-28 NOTE — Anesthesia Preprocedure Evaluation (Addendum)
Anesthesia Evaluation  Patient identified by MRN, date of birth, ID band Patient awake    Reviewed: Allergy & Precautions, NPO status , Patient's Chart, lab work & pertinent test results  Airway Mallampati: III  TM Distance: >3 FB Neck ROM: Full  Mouth opening: Limited Mouth Opening Comment: Redundant neck tissue Dental no notable dental hx.    Pulmonary asthma , sleep apnea ,  H/o COVID (longhauler)    + decreased breath sounds      Cardiovascular hypertension,  Rhythm:Regular Rate:Tachycardia  PE 05/2019   Neuro/Psych  Headaches, CVA (speech and weakness), Residual Symptoms negative psych ROS   GI/Hepatic negative GI ROS, Neg liver ROS,   Endo/Other  diabetes, Type 2, Insulin Dependentobesity  Renal/GU negative Renal ROS  negative genitourinary   Musculoskeletal   Abdominal (+) + obese,   Peds negative pediatric ROS (+)  Hematology negative hematology ROS (+)   Anesthesia Other Findings   Reproductive/Obstetrics negative OB ROS                            Anesthesia Physical Anesthesia Plan  ASA: III  Anesthesia Plan: MAC   Post-op Pain Management:    Induction:   PONV Risk Score and Plan: 2 and Propofol infusion, TIVA and Treatment may vary due to age or medical condition  Airway Management Planned: Nasal Cannula and Natural Airway  Additional Equipment:   Intra-op Plan:   Post-operative Plan:   Informed Consent: I have reviewed the patients History and Physical, chart, labs and discussed the procedure including the risks, benefits and alternatives for the proposed anesthesia with the patient or authorized representative who has indicated his/her understanding and acceptance.       Plan Discussed with: Anesthesiologist and CRNA  Anesthesia Plan Comments:         Anesthesia Quick Evaluation

## 2020-03-28 NOTE — Progress Notes (Signed)
  Speech Language Pathology Treatment: Dysphagia  Patient Details Name: Shelly Shields MRN: [id number] DOB: [birth year] Today's Date: 03/28/2020 Time: 8341-9622 SLP Time Calculation (min) (ACUTE ONLY): 15 min  Assessment / Plan / Recommendation Clinical Impression  Pt's swallow function similar to bedside swallow assessment yesterday. Consumed approximately 2 oz water via straw consecutively without s/s aspiration and subsequent trial throughout the session. Left labial leakage with thin with pt aware and she independently removed pudding from lip. Oral clearance with regular texture. Reminded pt to periodically check using lingual sweep. SLP ordered regular texture/thin liquids, straws allowed, pills with thin and will follow briefly for efficiency. SLE is pending- pt adequately awake for swallow however want to defer until fully alert (plan to perform tomorrow)   HPI HPI: 39 year old female with history of diabetes, hypertension, asthma, COVID-19 infection with acute pulmonary embolus in February, no longer on anticoagulation, presents with concern for waking this morning with difficulty speaking. Last evening ER RN reassessed swallow screen due to pt's facial weakness- failed Yale due to hesitating during 3 oz water and throat clearing; made NPO. NPO status continued until TEE completed. Pt seen for diagnostic treatment for ability to re-initiate po's.      SLP Plan  Continue with current plan of care       Recommendations  Diet recommendations: Regular;Thin liquid Liquids provided via: Cup;Straw Medication Administration: Whole meds with liquid Supervision: Patient able to self feed Compensations: Lingual sweep for clearance of pocketing;Slow rate;Small sips/bites Postural Changes and/or Swallow Maneuvers: Seated upright 90 degrees                General recommendations: Rehab consult Oral Care Recommendations: Oral care BID Follow up Recommendations: Inpatient Rehab SLP  Visit Diagnosis: Dysphagia, unspecified (R13.10) Plan: Continue with current plan of care                       Royce Macadamia 03/28/2020, 4:20 PM  Breck Coons Lonell Face.Ed Nurse, children's [phone] Office [phone]

## 2020-03-28 NOTE — Progress Notes (Signed)
SLP Cancellation Note  Patient Details Name: Shelly Shields MRN: [id number] DOB: 08/17/1980   Cancelled treatment:        Will re attempt today as schedule allows for swallow re-assessment and speech-language-cognitive eval. Pt currently in TEE. RN reported pt had difficulty swallowing yesterday. The stroke swallow screen was re administered and she hesitated during 3 oz water and therefore failed the Yale and made NPO.     Royce Macadamia 03/28/2020, 12:37 PM  Breck Coons Lonell Face.Ed Nurse, children's [phone] Office [phone]

## 2020-03-29 DIAGNOSIS — I63411 Cerebral infarction due to embolism of right middle cerebral artery: Principal | ICD-10-CM

## 2020-03-29 DIAGNOSIS — G43709 Chronic migraine without aura, not intractable, without status migrainosus: Secondary | ICD-10-CM

## 2020-03-29 DIAGNOSIS — E119 Type 2 diabetes mellitus without complications: Secondary | ICD-10-CM

## 2020-03-29 LAB — GLUCOSE, CAPILLARY
Glucose-Capillary: 155 mg/dL — ABNORMAL HIGH (ref 70–99)
Glucose-Capillary: 143 mg/dL — ABNORMAL HIGH (ref 70–99)
Glucose-Capillary: 200 mg/dL — ABNORMAL HIGH (ref 70–99)
Glucose-Capillary: 193 mg/dL — ABNORMAL HIGH (ref 70–99)
Glucose-Capillary: 226 mg/dL — ABNORMAL HIGH (ref 70–99)

## 2020-03-29 LAB — BASIC METABOLIC PANEL
Anion gap: 10 (ref 5–15)
BUN: 9 mg/dL (ref 6–20)
CO2: 20 mmol/L — ABNORMAL LOW (ref 22–32)
Calcium: 8.7 mg/dL — ABNORMAL LOW (ref 8.9–10.3)
Chloride: 109 mmol/L (ref 98–111)
Creatinine, Ser: 0.81 mg/dL (ref 0.44–1.00)
GFR, Estimated: >60 mL/min (ref >60)
Glucose, Bld: 187 mg/dL — ABNORMAL HIGH (ref 70–99)
Potassium: 3 mmol/L — ABNORMAL LOW (ref 3.5–5.1)
Sodium: 139 mmol/L (ref 135–145)

## 2020-03-29 LAB — URINE CULTURE: Culture: 40000 — AB

## 2020-03-29 LAB — CBC
HCT: 38.3 % (ref 36.0–46.0)
Hemoglobin: 12.7 g/dL (ref 12.0–15.0)
MCH: 28.5 pg (ref 26.0–34.0)
MCHC: 33.2 g/dL (ref 30.0–36.0)
MCV: 85.9 fL (ref 80.0–100.0)
Platelets: 245 10*3/uL (ref 150–400)
RBC: 4.46 MIL/uL (ref 3.87–5.11)
RDW: 13.2 % (ref 11.5–15.5)
WBC: 10.4 10*3/uL (ref 4.0–10.5)
nRBC: 0 % (ref 0.0–0.2)

## 2020-03-29 LAB — CARDIOLIPIN ANTIBODIES, IGG, IGM, IGA
Anticardiolipin IgA: <9 APL U/mL (ref 0–11)
Anticardiolipin IgG: 9 GPL U/mL (ref 0–14)
Anticardiolipin IgM: 12 MPL U/mL (ref 0–12)

## 2020-03-29 LAB — HOMOCYSTEINE: Homocysteine: 4.2 umol/L (ref 0.0–14.5)

## 2020-03-29 LAB — PROTEIN C ACTIVITY: Protein C Activity: 89 % (ref 73–180)

## 2020-03-29 LAB — ANTITHROMBIN III: AntiThromb III Func: 100 % (ref 75–120)

## 2020-03-29 LAB — PROTEIN S, TOTAL: Protein S Ag, Total: 121 % (ref 60–150)

## 2020-03-29 LAB — PROTEIN S ACTIVITY: Protein S Activity: 69 % (ref 63–140)

## 2020-03-29 MED ORDER — POTASSIUM CHLORIDE CRYS ER 20 MEQ PO TBCR
40.0000 meq | EXTENDED_RELEASE_TABLET | Freq: Four times a day (QID) | ORAL | Status: AC
  Administered 2020-03-29 (×2): 40 meq via ORAL
  Filled 2020-03-29 (×2): qty 2

## 2020-03-29 MED ORDER — SACUBITRIL-VALSARTAN 24-26 MG PO TABS
1.0000 | ORAL_TABLET | Freq: Two times a day (BID) | ORAL | Status: DC
  Administered 2020-03-30: 1 via ORAL
  Filled 2020-03-29 (×2): qty 1

## 2020-03-29 MED ORDER — CARVEDILOL 3.125 MG PO TABS
3.1250 mg | ORAL_TABLET | Freq: Two times a day (BID) | ORAL | Status: DC
  Administered 2020-03-29 – 2020-03-30 (×2): 3.125 mg via ORAL
  Filled 2020-03-29 (×2): qty 1

## 2020-03-29 MED ORDER — BUTALBITAL-APAP-CAFFEINE 50-325-40 MG PO TABS
1.0000 | ORAL_TABLET | Freq: Once | ORAL | Status: AC
  Administered 2020-03-29: 1 via ORAL
  Filled 2020-03-29: qty 1

## 2020-03-29 MED ORDER — POTASSIUM CHLORIDE CRYS ER 20 MEQ PO TBCR: 40.0000 meq | EXTENDED_RELEASE_TABLET | Freq: Four times a day (QID) | ORAL | Status: DC

## 2020-03-29 NOTE — Plan of Care (Signed)
  Problem: Education: Goal: Knowledge of disease or condition will improve Outcome: Progressing   Problem: Self-Care: Goal: Verbalization of feelings and concerns over difficulty with self-care will improve Outcome: Progressing   Problem: Coping: Goal: Will verbalize positive feelings about self Outcome: Progressing   Problem: Education: Goal: Individualized Educational Video(s) Outcome: Progressing

## 2020-03-29 NOTE — Evaluation (Signed)
Speech Language Pathology Evaluation Patient Details Name: Shelly Shields MRN: [id number] DOB: July 14, 1980 Today's Date: 03/29/2020 Time: 0630-1601 SLP Time Calculation (min) (ACUTE ONLY): 24 min  Problem List:  Patient Active Problem List   Diagnosis Date Noted   Acute CVA (cerebrovascular accident) (HCC) 03/27/2020   Sleep apnea    Diabetes mellitus without complication (HCC)    COVID-19 long hauler    Obesity    Acute pulmonary embolism (HCC) 06/30/2019   Pulmonary embolism (HCC) 05/2019   Hypernatremia    Asthma, chronic    DKA (diabetic ketoacidoses) 11/27/2014   Diabetes mellitus, new onset (HCC)    Hyperglycemic hyperosmolar nonketotic coma (HCC)    Essential hypertension    Past Medical History:  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 hauler    Diabetes mellitus without complication (HCC)    Hypertension    Migraines    Obesity    Pulmonary embolism (HCC) 05/2019   with COVID   Sleep apnea    Past Surgical History:  Past Surgical History:  Procedure Laterality Date   BILATERAL CARPAL TUNNEL RELEASE     HPI:  39 year old female with history of diabetes, hypertension, asthma, COVID-19 infection with acute pulmonary embolus in February, no longer on anticoagulation, presents with concern for waking this morning with difficulty speaking. Last evening ER RN reassessed swallow screen due to pt's facial weakness- failed Yale due to hesitating during 3 oz water and throat clearing; made NPO. NPO status continued until TEE completed. Pt seen for diagnostic treatment for ability to re-initiate po's.   Assessment / Plan / Recommendation Clinical Impression  Pt was seen for SLE and was alert and OX4. Her speech is moderately dysarthric and impacts her overall intelligbility. Given her facial weakness, labial sounds were observed to be the most impaired. SLP educated on compensatory strategies (slow rate, overarticulation) and  pt demonstrated understanding. Cognitive impairments were observed in the areas of memory, awareness, and executive functioning. Her memory impairment is characterized by a mildly decreased recall of new information and a retreival deficit. She demonstrates impairment of both intellectual and anticipatory awareness. Areas of executive function impairment include sequencing and organizing. 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 Her verbal expression is fluent with mild agrammatism. Mild agrammatism was also observed during a repetition task. Further SLP treatment is warranted to address dysarthria and cognition.     SLP Assessment  SLP Recommendation/Assessment: Patient needs continued Speech Lanaguage Pathology Services SLP Visit Diagnosis: Dysarthria and anarthria (R47.1);Cognitive communication deficit (R41.841)    Follow Up Recommendations  Inpatient Rehab;Outpatient SLP    Frequency and Duration min 2x/week  2 weeks      SLP Evaluation Cognition  Overall Cognitive Status: Impaired/Different from baseline Arousal/Alertness: Awake/alert Orientation Level: Oriented X4 Attention: Focused;Sustained;Selective Focused Attention: Appears intact Sustained Attention: Appears intact Selective Attention: Appears intact Memory: Impaired Memory Impairment: Decreased recall of new information;Retrieval deficit Awareness: Impaired Awareness Impairment: Intellectual impairment;Anticipatory impairment Problem Solving: Appears intact Executive Function: Self Correcting;Sequencing;Organizing Sequencing: Impaired Sequencing Impairment: Verbal basic Organizing: Impaired Organizing Impairment: Verbal basic Self Correcting: Appears intact Safety/Judgment: Impaired       Comprehension  Auditory Comprehension Overall Auditory Comprehension: Appears within functional limits for tasks assessed Yes/No  Questions: Within Functional Limits Commands: Within Functional Limits Conversation: Simple Visual Recognition/Discrimination Discrimination: Not tested Reading Comprehension Reading Status: Not tested  Expression Expression Primary Mode of Expression: Verbal Verbal Expression Overall Verbal Expression: Impaired Initiation: No impairment Automatic Speech: Name;Day of week;Social Response Level of Generative/Spontaneous Verbalization: Phrase Repetition: Impaired Level of Impairment: Phrase level Naming: No impairment Pragmatics: Impairment Impairments: Eye contact;Monotone Interfering Components: Speech intelligibility   Oral / Motor  Oral Motor/Sensory Function Overall Oral Motor/Sensory Function: Moderate impairment Facial ROM: Suspected CN VII (facial) dysfunction Facial Symmetry: Abnormal symmetry left;Suspected CN VII (facial) dysfunction Facial Strength: Suspected CN VII (facial) dysfunction;Reduced left Lingual ROM: Within Functional Limits Lingual Symmetry: Abnormal symmetry left;Suspected CN XII (hypoglossal) dysfunction Motor Speech Overall Motor Speech: Impaired Respiration: Within functional limits Phonation: Normal Resonance: Within functional limits Articulation: Impaired Level of Impairment: Word Intelligibility: Intelligibility reduced Word: 75-100% accurate Phrase: 75-100% accurate Sentence: 75-100% accurate Conversation: 75-100% accurate Motor Planning: Not tested   GO                    Royetta Crochet 03/29/2020, 12:50 PM

## 2020-03-29 NOTE — Progress Notes (Signed)
Subjective:  Patient without specific complaints today.  Intake/Output from previous day:  I/O last 3 completed shifts: In: 1112.1 [P.O.:200; I.V.:912.1] Out: -  No intake/output data recorded.  Blood pressure (!) 121/95, pulse 98, temperature 98.5 F (36.9 C), temperature source Oral, resp. rate 16, height _0  (1.549 m), weight 90.7 kg, SpO2 93 %. Physical Exam Constitutional:      Appearance: She is obese.  Cardiovascular:     Rate and Rhythm: Normal rate and regular rhythm.     Pulses: Intact distal pulses.     Heart sounds: Normal heart sounds. No murmur heard.  No gallop.      Comments: No leg edema, no JVD. Pulmonary:     Effort: Pulmonary effort is normal.     Breath sounds: Normal breath sounds.  Abdominal:     General: Bowel sounds are normal.     Palpations: Abdomen is soft.  Neurological:     Mental Status: She is oriented to person, place, and time.     Cranial Nerves: Cranial nerve deficit present.     Gait: Gait abnormal.     Lab Results: BMP BNP (last 3 results) Recent Labs    07/01/19 0420 07/02/19 0500  BNP 66.4 51.1    ProBNP (last 3 results) No results for input(s): PROBNP in the last 8760 hours. BMP Latest Ref Rng & Units 03/29/2020 03/27/2020 03/27/2020  Glucose 70 - 99 mg/dL 187(H) 363(H) 373(H)  BUN 6 - 20 mg/dL 9 7 5(L)  Creatinine 0.44 - 1.00 mg/dL 0.81 0.70 0.88  Sodium 135 - 145 mmol/L 139 137 133(L)  Potassium 3.5 - 5.1 mmol/L 3.0(L) 3.7 3.6  Chloride 98 - 111 mmol/L 109 103 104  CO2 22 - 32 mmol/L 20(L) - 21(L)  Calcium 8.9 - 10.3 mg/dL 8.7(L) - 8.4(L)   Hepatic Function Latest Ref Rng & Units 03/27/2020 02/06/2020 07/02/2019  Total Protein 6.5 - 8.1 g/dL 6.2(L) 7.3 6.2(L)  Albumin 3.5 - 5.0 g/dL 3.1(L) 3.7 2.1(L)  AST 15 - 41 U/L 13(L) 18 16  ALT 0 - 44 U/L _1 Alk Phosphatase 38 - 126 U/L 82 85 77  Total Bilirubin 0.3 - 1.2 mg/dL 1.0 1.3(H) 0.9   CBC Latest Ref Rng & Units 03/29/2020 03/27/2020 03/27/2020  WBC 4.0 - 10.5  K/uL 10.4 - 11.7(H)  Hemoglobin 12.0 - 15.0 g/dL 12.7 12.9 12.8  Hematocrit 36 - 46 % 38.3 38.0 39.3  Platelets 150 - 400 K/uL 245 - 195   Lipid Panel     Component Value Date/Time   CHOL 176 03/28/2020 0415   TRIG 106 03/28/2020 0415   HDL 29 (L) 03/28/2020 0415   CHOLHDL 6.1 03/28/2020 0415   VLDL 21 03/28/2020 0415   LDLCALC 126 (H) 03/28/2020 0415   Cardiac Panel (last 3 results) No results for input(s): CKTOTAL, CKMB, TROPONINI, RELINDX in the last 72 hours.  HEMOGLOBIN A1C Lab Results  Component Value Date   HGBA1C 11.2 (H) 03/27/2020   MPG 274.74 03/27/2020   TSH Recent Labs    03/28/20 0415  TSH 3.896   Imaging: CT Code Stroke CTA Head W/WO contrast 03/27/2020 1. Negative for large vessel occlusion but positive for Right MCA M3 occlusion. CT Perfusion detects core infarct with minimally larger penumbra at the right insula/operculum corresponding to the plain CT finding.  2. Additionally, mild vessel irregularity is noted in multiple other circle-of-Willis branches, including the left A1, right P2, left M2. Although nonspecific this constellation of clinical  and imaging findings might indicate accelerated branch vessel atherosclerosis. Although other large vessels appear normal, with no atherosclerosis in the neck or at the aortic arch.  MR ANGIO HEAD WO CONTRAST  03/27/2020  1. Moderate-sized acute right MCA infarct. 2. Small subacute left parieto-occipital infarcts. 3. Right M3 branch occlusion, otherwise negative head [id number]  CT ABDOMEN PELVIS W CONTRAST  03/27/2020  1. New 18 mm hypoattenuating lesion upper pole right kidney with areas of subcapsular multifocal decreased enhancement in the lower pole right kidney. Imaging features are nonspecific and could be related to pyelonephritis and phlegmon/evolving abscess in the upper pole right kidney cannot be excluded. Alternatively, multifocal right renal infarct could have this appearance. Correlation with urinalysis  may prove helpful.  2. Relatively well-defined 2.2 cm lesion in the inferior lingula measures water density. This may be loculated pleural fluid or intraparenchymal fluid collection in this patient with a history of pulmonary embolus and bilateral ground-glass airspace disease on the previous study suspicious for multifocal atypical/viral pneumonia. Consider follow-up to ensure resolution.  Cardiac Studies:  ECHOCARDIOGRAM COMPLETE BUBBLE STUDY  03/27/2020 1. Left ventricular ejection fraction, by estimation, is 20 to 25%. The left ventricle has severely decreased function. There is severe hypokinesis of the basal-to-apical anteroseptum, inferoseptum and anterior LV walls . Left ventricular diastolic  parameters are consistent with Grade II diastolic dysfunction (pseudonormalization). 2. Right ventricular systolic function is normal. The right ventricular size is normal. 3. Left atrial size was moderately dilated. 4. The mitral valve is normal in structure. Trivial mitral valve regurgitation. 5. The aortic valve is tricuspid. There is mild thickening of the aortic valve. Aortic valve regurgitation is not visualized. 6. The inferior vena cava is normal in size with greater than 50% respiratory variability, suggesting right atrial pressure of 3 mmHg. 7. Agitated saline contrast bubble study was negative, with no evidence of any interatrial shunt.  Comparison(s): Compared to prior echo in 06/2019, the LVEF is now severely reduced to 20-25% with severe hypokinesis of the inferoseptal, anteroseptal and anterior LV walls.  EKG:  EKG 03/27/2020: Sinus tachycardia at rate of 102 bpm, normal axis, borderline criteria for LVH.  Borderline QT elongation.  Compared to 06/30/2019, sinus tachycardia new.  No change in QT elongation.  No results found for this or any previous visit (from the past 43800 hour(s)).  Scheduled Meds: . aspirin  300 mg Rectal Daily   Or  . aspirin  325 mg Oral Daily  .  budesonide  0.25 mg Inhalation BID  . enoxaparin (LOVENOX) injection  40 mg Subcutaneous Q24H  . escitalopram  10 mg Oral Daily  . insulin aspart  0-15 Units Subcutaneous TID WC  . insulin aspart  0-5 Units Subcutaneous QHS  . insulin glargine  80 Units Subcutaneous BID  . nortriptyline  10 mg Oral QHS  . pantoprazole  40 mg Oral Daily  . potassium chloride  40 mEq Oral Q6H  . rosuvastatin  20 mg Oral q1800  . topiramate  200 mg Oral QHS   Continuous Infusions: PRN Meds:.acetaminophen **OR** acetaminophen (TYLENOL) oral liquid 160 mg/5 mL **OR** acetaminophen, albuterol, senna-docusate  Assessment/Plan:  1.  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  Primary hypertension 3.  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  History of Covid pneumonia in January 2021 and also presented with pulmonary emboli  Recommendation: New onset dilated cardiomyopathy could be either stress cardiomyopathy versus significant coronary artery disease in view of  multiple cardiovascular factors including hypertension, hyperlipidemia and diabetes mellitus which is uncontrolled.  She will eventually need outpatient cardiac work-up either a stress test or a cardiac catheterization.  With regard to embolic complications, CVA and also renal infarcts all suggest cardiac etiology.  Question is whether the small PFO noted on the TEE is a culprit or a innocent bystander as it is very small, patient also has risk factors for atrial fibrillation including obesity, hypertension, severe obstructive sleep apnea, diabetes mellitus.  Anticoagulation will probably be best option in view of coagulopathy and hypercoagulable state.  I have also sent for ANA and hypercoagulable panel.  With regard to nonischemic versus ischemic cardiomyopathy with severe LV systolic dysfunction, would recommend starting her back on carvedilol, as he is hypertensive could start at  6.25 mg p.o. twice daily and also Entresto 24/26 mg twice daily.  I will leave it for the primary team to make changes to her antihypertensive medications as they are managing her stroke as well.  I have discussed with Dr. Phillips Climes.   Adrian Prows, MD, [facility] 03/29/2020, 10:11 AM Office: [phone] Pager: [phone]

## 2020-03-29 NOTE — Progress Notes (Signed)
STROKE TEAM PROGRESS NOTE   INTERVAL HISTORY Mom at the bedside.  Patient sitting in chair, still has dysarthria, however moving extremities symmetrically.  PT/OT recommend outpatient speech therapy.  Agree with Shelly Shields, recommend to start 11/5 to avoid hemorrhagic conversion.  Follow-up with Shelly Shields as outpatient.  Patient also has her own neurologist Shelly Shields in [facility] to follow.  Vitals:   03/29/20 0412 03/29/20 0819 03/29/20 0919 03/29/20 1358  BP: (!) 121/95  (!) 127/96 (!) 134/91  Pulse: 98  100 99  Resp: 16  20 18   Temp: 98.5 F (36.9 C)  98.2 F (36.8 C) 99.6 F (37.6 C)  TempSrc: Oral  Oral Oral  SpO2: 93% 98% 95% 99%  Weight:      Height:       CBC:  Recent Labs  Lab 03/27/20 0946 03/27/20 0946 03/27/20 1002 03/29/20 0229  WBC 11.7*  --   --  10.4  NEUTROABS 9.7*  --   --   --   HGB 12.8   < > 12.9 12.7  HCT 39.3   < > 38.0 38.3  MCV 86.6  --   --  85.9  PLT 195  --   --  245   < > = values in this interval not displayed.   Basic Metabolic Panel:  Recent Labs  Lab 03/27/20 0946 03/27/20 0946 03/27/20 1002 03/29/20 0229  NA 133*   < > 137 139  K 3.6   < > 3.7 3.0*  CL 104   < > 103 109  CO2 21*  --   --  20*  GLUCOSE 373*   < > 363* 187*  BUN 5*   < > 7 9  CREATININE 0.88   < > 0.70 0.81  CALCIUM 8.4*  --   --  8.7*   < > = values in this interval not displayed.   Lipid Panel:  Recent Labs  Lab 03/28/20 0415  CHOL 176  TRIG 106  HDL 29*  CHOLHDL 6.1  VLDL 21  LDLCALC 13/02/21*   HgbA1c:  Recent Labs  Lab 03/27/20 0946  HGBA1C 11.2*   Urine Drug Screen:  Recent Labs  Lab 03/27/20 1024  LABOPIA NONE DETECTED  COCAINSCRNUR NONE DETECTED  LABBENZ NONE DETECTED  AMPHETMU NONE DETECTED  THCU NONE DETECTED  LABBARB NONE DETECTED    Alcohol Level  Recent Labs  Lab 03/27/20 0946  ETH <10    IMAGING past 24 hours No results found.  TEE 03/28/2020 LV: Dilated, global hypokinesis, EF 20%.  No mural thrombus. RV: Normal. Left  atrium: Mildly dilated.  There is a small PFO with right to left shunting, weakly positive shunting. Left atrial appendage: Normal.  No thrombus.  Normal function. Right atrium: Normal. Mitral valve: Normal.  Trace MR. Tricuspid valve: Normal.  Trace TR. Pulmonary valve: Normal. Aortic valve: Normal.  No aortic regurgitation. Aorta: Normal.  No dissection. Impression: No obvious cause for cardiac source of cerebral emboli, weakly positive left-to-right shunting by contrast study and also severe LV systolic dysfunction without mural thrombus.  Left atrial appendage is normal and thoracic aorta is normal.   PHYSICAL EXAM   Temp:  [98.2 F (36.8 C)-100 F (37.8 C)] 99.6 F (37.6 C) (11/03 1358) Pulse Rate:  [96-109] 99 (11/03 1358) Resp:  [16-20] 18 (11/03 1358) BP: (121-143)/(86-98) 134/91 (11/03 1358) SpO2:  [93 %-100 %] 99 % (11/03 1358)  General - Well nourished, well developed, in no apparent distress.  Ophthalmologic -  fundi not visualized due to noncooperation.  Cardiovascular - Regular rhythm and rate.  Mental Status -  Level of arousal and orientation to time, place, and person were intact. Language including expression, naming, repetition, comprehension was assessed and found intact.  Moderate dysarthria Fund of Knowledge was assessed and was intact.  Cranial Nerves II - XII - II - Visual field intact OU. III, IV, VI - Extraocular movements intact. V - Facial sensation intact bilaterally. VII - left facial droop. VIII - Hearing & vestibular intact bilaterally. X - Palate elevates symmetrically.  Moderate dysarthria XI - Chin turning & shoulder shrug intact bilaterally. XII - Tongue protrusion to the left.  Motor Strength - The patient's strength was normal in all extremities and pronator drift was absent.  Bulk was normal and fasciculations were absent.   Motor Tone - Muscle tone was assessed at the neck and appendages and was normal.  Reflexes - The patient's  reflexes were symmetrical in all extremities and she had no pathological reflexes.  Sensory - Light touch, temperature/pinprick were assessed and were symmetrical.    Coordination - The patient had normal movements in the hands with no ataxia or dysmetria.  Tremor was absent.  Gait and Station - deferred.   ASSESSMENT/PLAN [name] is a 39 y.o. female with history of type 2 diabetes, hypertension, hyperlipidemia, obesity, sleep apnea, migraines (not hemiplegic), asthma, COVID-19 (January 2021, complicated by PE in February, no longer on anticoagulation) presenting with slurred speech in setting of R flank pain, nausea, diarrhea and chills.   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 ASPECTS 8    CTA head & neck no LVO. R M3 occlusion. Mild atherosclerosis L A1, R P2, L M2.  CT perfusion core infarct slightly larger than R insular and posterior frontal lobe infarct seen on CT.   MRI  Moderate R MCA infarct. Sub subacute L parieto-occipital infarcts.   MRA  R M3 branch occlusion   2D Echo EF 20-25%. Severe HK. LA moderately dilated. No source of embolus   TEE 11/2 no SOE seen, weak L to R shunt. Severe LV systolic dysfunction w/o thrombus. LAA nml. Thoracic aorta nml.   Hypercoagulable labs pending   LDL 126  HgbA1c 11.2  VTE prophylaxis - Lovenox 40 mg sq daily   Shelly Shields (apixaban) daily prior to admission, now on aspirin 325 mg daily. Agree with Shelly Shields for anticoagulation. Given moderate sized infarct, recommend to start Shelly Shields on 11/5 to avoid hemorrhagic conversion. Once AC started, no ASA needed from neuro standpoint.   Therapy recommendations:  OP speech  Disposition:  Pending  Follow up with her own neurologist Shelly Shields at [facility]   Cardiomyopathy  06/2019 EF 50 to 55%  This admission  EF 20 to 25% on TTE, 20% on TEE  Likely ischemic vs Covid myocarditis  Further cardiac work-up per Shelly Shields  New low dose coreg today and Ollen Barges tomorrow  Agree with Shelly Shields, recommend Shelly Shields to start 11/5  History of a Covid infection  COVID-19 long hauler - hospitalized 05/2019 and treated w/ Remdesivir and steroids. Persistent cough and fatique.  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 Long-term BP goal normotensive  Hyperlipidemia  Home meds:  crestor 10  Now on crestor 20  LDL 126, goal < 70  Continue statin at discharge  Diabetes type II Uncontrolled  HgbA1c 11.2, goal < 7.0  CBGs  SSI  DM education  On Lantus  Close PCP follow-up for better DM control  Chronic migraines  Followed with neurology Shelly Shields at [facility]  Given current stroke, recommend to avoid triptans  Fioricet as needed  Continue to follow-up with Shelly Shields  Other Stroke Risk Factors  ETOH use, alcohol level <10, advised to drink no more than 1 drink(s) a day  Obesity, Body mass index is 37.79 kg/m., BMI >/= 30 associated with increased stroke risk, recommend weight loss, diet and exercise as appropriate   Family hx stroke (father and on maternal side of family too)  obstructive sleep apnea on CPAP, was followed by Dr. Vickey Huger at [facility] in the past   Other Active Problems  Asthma   R flank pain, suspect embolic R renal infarct   Hospital day # 2  Neurology will sign off. Please call with questions. Pt will follow up with Shelly Shields at [facility] in 2 weeks. Thanks for the  consult.   Marvel Plan, MD PhD Stroke Neurology 03/29/2020 3:05 PM    To contact Stroke Continuity provider, please refer to [email or web address]. After hours, contact General Neurology

## 2020-03-29 NOTE — Progress Notes (Signed)
Inpatient Diabetes Program Recommendations  AACE/ADA: New Consensus Statement on Inpatient Glycemic Control (2015)  Target Ranges:  Prepandial:   less than 140 mg/dL      Peak postprandial:   less than 180 mg/dL (1-2 hours)      Critically ill patients:  140 - 180 mg/dL   Lab Results  Component Value Date   GLUCAP 143 (H) 03/29/2020   HGBA1C 11.2 (H) 03/27/2020    Review of Glycemic Control Results for GERLEAN, CID (MRN [id number]) as of 03/29/2020 11:39  Ref. Range 03/28/2020 08:26 03/28/2020 15:23 03/28/2020 21:10 03/29/2020 06:15 03/29/2020 07:29  Glucose-Capillary Latest Ref Range: 70 - 99 mg/dL 662 (H) 947 (H) 654 (H) 155 (H) 143 (H)   Diabetes history:  DM2 Outpatient Diabetes medications:  Lantus 80 units BID Humalog 75/25 50 units bid Current orders for Inpatient glycemic control:  80 units bid Novolog 0-20 units tid  Novolog 0-5 qhs   Note:  Diabetes consult for DM management.  CBG's mostly trending well on current regimen.  Attempted to call patient and discuss A1C of 11.2%. She is unable to hold a conversation over the phone. Will try at a later time.  Will continue to follow while inpatient.  Thank you, Dulce Sellar, RN, BSN Diabetes Coordinator Inpatient Diabetes Program [phone] (team pager from 8a-5p)

## 2020-03-29 NOTE — Progress Notes (Signed)
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 PMH HTN HLD DM2 obese OSA on cpap COVID with pulmonary embolism vaculitis    PT Comments    Pt tolerates treatment well with improved ambulation tolerance and quality. Pt without instance of L foot drag this session and is able to ambulate and perform ADL tasks without UE support. Pt does continue to demonstrate slowed processing, with increased time to communicate during session, but otherwise is appropriate during session. Pt will benefit from continued acute PT POC to improve activity tolerance and to continue progressing upon dynamic gait and balance. PT updates recommendations to discharge home with HHPT and continued use of the pt's cane.  Follow Up Recommendations  Home health PT;Supervision - Intermittent     Equipment Recommendations  None recommended by PT    Recommendations for Other Services       Precautions / Restrictions Precautions Precautions: Fall Restrictions Weight Bearing Restrictions: No    Mobility  Bed Mobility Overal bed mobility: Needs Assistance Bed Mobility: Supine to Sit     Supine to sit: Supervision        Transfers Overall transfer level: Needs assistance Equipment used: None Transfers: Sit to/from Stand Sit to Stand: Min guard            Ambulation/Gait Ambulation/Gait assistance: Supervision Gait Distance (Feet): 150 Feet Assistive device: None Gait Pattern/deviations: Step-to pattern Gait velocity: decreased Gait velocity interpretation: <1.8 ft/sec, indicate of risk for recurrent falls General Gait Details: pt with short step-to gait, no observable L foot drag this session   Stairs             Wheelchair Mobility    Modified Rankin (Stroke  Patients Only) Modified Rankin (Stroke Patients Only) Pre-Morbid Rankin Score: No significant disability Modified Rankin: Moderately severe disability     Balance Overall balance assessment: Needs assistance Sitting-balance support: No upper extremity supported;Feet supported Sitting balance-Leahy Scale: Good     Standing balance support: No upper extremity supported;During functional activity Standing balance-Leahy Scale: Good Standing balance comment: close supervision without UE support                            Cognition Arousal/Alertness: Awake/alert Behavior During Therapy: Flat affect Overall Cognitive Status: Impaired/Different from baseline Area of Impairment: Problem solving                             Problem Solving: Slow processing General Comments: increased time to respond      Exercises      General Comments General comments (skin integrity, edema, etc.): VSS on RA, pt with improved activity tolerance. Dyarthric and with short 1-2 word responses to this PT      Pertinent Vitals/Pain Pain Assessment: No/denies pain    Home Living                      Prior Function            PT Goals (current goals can now be found in the care plan section) Acute Rehab PT Goals Patient Stated Goal: return to home and work Progress towards PT goals: Progressing toward goals    Frequency    Min  4X/week      PT Plan Current plan remains appropriate    Co-evaluation              AM-PAC PT "6 Clicks" Mobility   Outcome Measure  Help needed turning from your back to your side while in a flat bed without using bedrails?: None Help needed moving from lying on your back to sitting on the side of a flat bed without using bedrails?: None Help needed moving to and from a bed to a chair (including a wheelchair)?: None Help needed standing up from a chair using your arms (e.g., wheelchair or bedside chair)?: None Help needed  to walk in hospital room?: None Help needed climbing 3-5 steps with a railing? : A Little 6 Click Score: 23    End of Session   Activity Tolerance: Patient tolerated treatment well Patient left: in chair;with call bell/phone within reach;with chair alarm set Nurse Communication: Mobility status PT Visit Diagnosis: Unsteadiness on feet (R26.81);Hemiplegia and hemiparesis;Pain;Difficulty in walking, not elsewhere classified (R26.2) Hemiplegia - Right/Left: Left Hemiplegia - dominant/non-dominant: Non-dominant Hemiplegia - caused by: Cerebral infarction     Time: 1022-1036 PT Time Calculation (min) (ACUTE ONLY): 14 min  Charges:  $Gait Training: 8-22 mins                     Arlyss Gandy, PT, DPT Acute Rehabilitation Pager: [phone]    Arlyss Gandy 03/29/2020, 11:21 AM

## 2020-03-29 NOTE — TOC Benefit Eligibility Note (Signed)
Transition of Care [facility]) Benefit Eligibility Note    Patient Details  Name: Shelly Shields MRN: [id number] Date of Birth: [birth year]   Medication/Dose: Delene Loll 24-26 MG BID  Covered?: Yes  Tier: 2 Drug  Prescription Coverage Preferred Pharmacy: [facility]  and  Roseanne Kaufman with Person/Company/Phone Number:: DIANA  @ [employer] LO # [phone]  Co-Pay: $15.00  Prior Approval: No  Deductible: Met       Memory [facility]ue Phone Number: 03/29/2020, 2:38 PM

## 2020-03-29 NOTE — Progress Notes (Signed)
PROGRESS NOTE   Shelly Shields  PPI:[id number]    DOB: 02-Apr-1981    DOA: 03/27/2020  PCP: Everlean Cherry, MD   I have briefly reviewed patients previous medical records in [facility] Link.  Chief Complaint  Patient presents with  . Code Stroke    Brief Narrative:  39 year old female, lives with her cousins and independent, PMH of obesity, OSA on CPAP, HTN, poorly controlled DM2/IDDM, asthma, COVID-19 PNA with PE in January 2021, off of anticoagulation since June, completed COVID-19 vaccine x2 in June, presented with complaints of dysarthria and left-sided weakness on day of admission and right flank pain the day prior.  Admitted for acute right brain stroke, suspecting embolic etiology, neurology consulting.  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  Still has residual dysarthria and left hemiparesis. CT head code stroke: Findings concerning for acute or early subacute infarct involving the right insula and posterior frontal lobe (MCA territory). CTA head and neck and CT perfusion brain: Negative for large vessel occlusion but positive for right MCA M3 occlusion.  CT perfusion detects core infarct with minimally larger penumbra at the right insula/operculum. MRI brain: Moderate sized acute right MCA infarct.  Small subacute left parieto-occipital infarcts. MRA brain: Right M3 branch occlusion, otherwise negative MRA. TTE: LVEF 20-25%.  Severe hypokinesis of the basal to apical anteroseptum, inferoseptum and anterior LV walls.  Grade 2 diastolic dysfunction.  Bubble study negative for  interatrial shunt. TEE: LVEF 20%, global hypokinesis, no mural thrombus, small PFO with right-to-left shunting.  No obvious cause for cardiac source of cerebral emboli LDL 126.  Goal <70, continue Crestor 20 mg daily. A1c 11.2.  Goal <7.  Pregnancy test negative. Currently on aspirin, await stroke MD follow-up regarding further evaluation and management and need to start anticoagulation. -Recommendation for full anticoagulation, to start on 11/5 to avoid hemoconversion.  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 S/p TEE without embolic source.  Continue monitoring on telemetry for potential arrhythmias/A. fib etc. Per cardiology, she will eventually need ischemic work-up in view of underlying cardiac risk factors including DM, HTN, HLD. -Cussed with cardiology and urology, overall she is beyond permissive hypertension.,  But her blood pressure is acceptable currently, so she will be started on cardiac meds gradually, will start on Coreg low-dose today, and interested tomorrow  Essential hypertension Allow permissive hypertension and treat BP only if >220/120 consistently. Holding ARB for now.  Hyperlipidemia LDL 126, goal <70.  Now on Crestor 20 mg daily.  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  Currently on home dose of Lantus Lantus 80 units twice daily and added SSI.  Mildly uncontrolled.  Monitor closely and adjust as needed.  Looking at her home meds, it appears confusing because she in addition to the Lantus is also on Humalog 75/25 which should not be.  We will need to optimize her DM control prior to DC.  Diabetes coordinator consulted.  Right flank pain/suspect embolic right renal infarct. CT abdomen and pelvis: New 18 mm hypoattenuating lesion upper pole  right kidney with areas of subcapsular multifocal decreased enhancement in the  lower pole right kidney-nonspecific features and could be related to pyelonephritis and phlegmon/evolving abscess in the upper pole right kidney cannot be excluded.  Alternatively multifocal right renal infarct. Urine microscopy not indicated above UTI: Rare bacteria and no significant pyuria or hematuria.  History of COVID-19 pneumonia in January 2021 with long-term sequelae Required hospitalization in January 2021 with treatment with remdesivir and steroids. Has persistent cough, fatigue and dyspnea on exertion and uses cane to ambulate. She had associated pulmonary embolism. Some question regarding Covid related hypercoagulability state that is persistent.  History of PE Previously on Eliquis for Covid associated PE -We will check venous Doppler to rule lower extremity DVT. -She is to be resumed back on Eliquis on 11/5.  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  No clinical bronchospasm  OSA -Continue CPAP  Abnormal finding on CT abdomen/2.2 cm lesion in the inferior lingula Relatively well-defined 2.2 cm lesion in the inferior lingula may be loculated pleural fluid or intraparenchymal fluid collection in this patient with history of pulmonary embolism and bilateral groundglass airspace disease on the previous study suspicious for multifocal atypical/viral pneumonia.  Recommend follow-up to ensure resolution  Body mass index is 37.79  kg/m./Obesity Weight loss needs to be encouraged and follow-up with outpatient PCP/bariatric medicine.  Migraine: Continue Topamax.   DVT prophylaxis: enoxaparin (LOVENOX) injection 40 mg Start: 03/27/20 1600     Code Status: Full Code Family Communication: None at bedside Disposition:  Status is: Inpatient  Remains inpatient appropriate because:Inpatient level of care appropriate due to severity of illness   Dispo: The patient is from: Home              Anticipated d/c is to: Home              Anticipated d/c date is: 1 day              Patient currently is not medically stable to d/c.        Consultants:   Neurology Cardiology  Procedures:   TEE 03/28/2020  Antimicrobials:    Anti-infectives (From admission, onward)   None        Subjective:  She denies any complaints this morning  Objective:   Vitals:   03/29/20 0022 03/29/20 0412 03/29/20 0819 03/29/20 0919  BP: (!) 143/98 (!) 121/95  (!) 127/96  Pulse: (!) 108 98  100  Resp: 19 16  20   Temp: 98.6 F (37 C) 98.5 F (36.9 C)  98.2 F (36.8 C)  TempSrc: Oral Oral  Oral  SpO2: 100% 93% 98% 95%  Weight:      Height:       Awake Alert, Oriented X 3, No new F.N deficits, Normal affect, she remains with significant edema, left upper extremity weakness, but this seems to be improving. Symmetrical Chest wall movement, Good air movement bilaterally, CTAB RRR,No Gallops,Rubs or new Murmurs, No Parasternal Heave +ve B.Sounds, Abd Soft, No tenderness, No  rebound - guarding or rigidity. No Cyanosis, Clubbing or edema, No new Rash or bruise      Data Reviewed:   I have personally reviewed following labs and imaging studies   CBC: Recent Labs  Lab 03/27/20 0946 03/27/20 1002 03/29/20 0229  WBC 11.7*  --  10.4  NEUTROABS 9.7*  --   --   HGB 12.8 12.9 12.7  HCT 39.3 38.0 38.3  MCV 86.6  --  85.9  PLT 195  --  245    Basic Metabolic Panel: Recent Labs  Lab 03/27/20 0946 03/27/20 1002  03/29/20 0229  NA 133* 137 139  K 3.6 3.7 3.0*  CL 104 103 109  CO2 21*  --  20*  GLUCOSE 373* 363* 187*  BUN 5* 7 9  CREATININE 0.88 0.70 0.81  CALCIUM 8.4*  --  8.7*    Liver Function Tests: Recent Labs  Lab 03/27/20 0946  AST 13*  ALT 18  ALKPHOS 82  BILITOT 1.0  PROT 6.2*  ALBUMIN 3.1*    CBG: Recent Labs  Lab 03/29/20 0615 03/29/20 0729 03/29/20 1214  GLUCAP 155* 143* 200*    Microbiology Studies:   Recent Results (from the past 240 hour(s))  Respiratory Panel by RT PCR (Flu A&B, Covid) - Nasopharyngeal Swab     Status: None   Collection Time: 03/27/20 10:04 AM   Specimen: Nasopharyngeal Swab  Result Value Ref Range Status   SARS Coronavirus 2 by RT PCR NEGATIVE NEGATIVE Final    Comment: (NOTE) SARS-CoV-2 target nucleic acids are NOT DETECTED.  The SARS-CoV-2 RNA is generally detectable in upper respiratoy specimens during the acute phase of infection. The lowest concentration of SARS-CoV-2 viral copies this assay can detect is 131 copies/mL. A negative result does not preclude SARS-Cov-2 infection and should not be used as the sole basis for treatment or other patient management decisions. A negative result may occur with  improper specimen collection/handling, submission of specimen other than nasopharyngeal swab, presence of viral mutation(s) within the areas targeted by this assay, and inadequate number of viral copies (<131 copies/mL). A negative result must be combined with clinical observations, patient history, and epidemiological information. The expected result is Negative.  Fact Sheet for Patients:  https://www.moore.com/  Fact Sheet for Healthcare Providers:  https://www.young.biz/  This test is no t yet approved or cleared by the [place] FDA and  has been authorized for detection and/or diagnosis of SARS-CoV-2 by FDA under an Emergency Use Authorization (EUA). This EUA will remain  in  effect (meaning this test can be used) for the duration of the COVID-19 declaration under Section 564(b)(1) of the Act, 21 U.S.C. section 360bbb-3(b)(1), unless the authorization is terminated or revoked sooner.     Influenza A by PCR NEGATIVE NEGATIVE Final   Influenza B by PCR NEGATIVE NEGATIVE Final    Comment: (NOTE) The Xpert Xpress SARS-CoV-2/FLU/RSV assay is intended as an aid in  the diagnosis of influenza from Nasopharyngeal swab specimens and  should not be used as a sole basis for treatment. Nasal washings and  aspirates are unacceptable for Xpert Xpress SARS-CoV-2/FLU/RSV  testing.  Fact Sheet for Patients: https://www.moore.com/  Fact Sheet for Healthcare Providers: https://www.young.biz/  This test is not yet approved or cleared by the [place] FDA and  has been authorized for detection and/or diagnosis of SARS-CoV-2 by  FDA under an Emergency Use Authorization (EUA). This EUA will remain  in effect (meaning this test can be used) for the duration  of the  Covid-19 declaration under Section 564(b)(1) of the Act, 21  U.S.C. section 360bbb-3(b)(1), unless the authorization is  terminated or revoked. Performed at [facility], [address] [place], [place] [address]      Radiology Studies:  MR ANGIO HEAD WO CONTRAST  Result Date: 03/27/2020 CLINICAL DATA:  Stroke follow-up. Slurred speech. Left-sided weakness. EXAM: MRI HEAD WITHOUT CONTRAST MRA HEAD WITHOUT CONTRAST TECHNIQUE: Multiplanar, multiecho pulse sequences of the brain and surrounding structures were obtained without intravenous contrast. Angiographic images of the head were obtained using MRA technique without contrast. COMPARISON:  Head CT, CTA, and CTP 03/27/2020 FINDINGS: MRI HEAD FINDINGS Brain: There is a moderate-sized acute right MCA territory infarct involving the posterior frontal lobe and insula with good correlation with the earlier CTP. there is  a small amount of associated petechial hemorrhage without malignant hemorrhagic transformation. Additionally, there are subcentimeter infarcts involving cortex and white matter in the left parietal and left occipital lobes which are largely subacute in appearance and which also have a small amount of associated petechial hemorrhage. There is no intracranial mass effect or extra-axial fluid collection. The ventricles are normal in size. Vascular: Major intracranial vascular flow voids are preserved. Skull and upper cervical spine: Unremarkable bone marrow signal. Sinuses/Orbits: Unremarkable orbits. Paranasal sinuses and mastoid air cells are clear. Other: None. MRA HEAD FINDINGS The visualized distal vertebral arteries are widely patent to the basilar. Patent PICA, AICA, and SCA origins are seen bilaterally. The basilar artery is widely patent. Posterior communicating arteries are not clearly identified and may be diminutive or absent. Both PCAs are patent without evidence of a significant proximal stenosis. The internal carotid arteries are widely patent from skull base to carotid termini. ACAs and MCAs are patent without evidence of a significant proximal stenosis. As seen on the earlier CTA, there is a right M3 branch occlusion with some distal reconstitution. The scattered areas of mild branch vessel irregularity involving anterior and posterior circulation elsewhere on CTA are not apparent on this MRA. No aneurysm is identified. IMPRESSION: 1. Moderate-sized acute right MCA infarct. 2. Small subacute left parieto-occipital infarcts. 3. Right M3 branch occlusion, otherwise negative head MRA. Electronically Signed   By: Sebastian Ache M.D.   On: 03/27/2020 17:45   MR BRAIN WO CONTRAST  Result Date: 03/27/2020 CLINICAL DATA:  Stroke follow-up. Slurred speech. Left-sided weakness. EXAM: MRI HEAD WITHOUT CONTRAST MRA HEAD WITHOUT CONTRAST TECHNIQUE: Multiplanar, multiecho pulse sequences of the brain and  surrounding structures were obtained without intravenous contrast. Angiographic images of the head were obtained using MRA technique without contrast. COMPARISON:  Head CT, CTA, and CTP 03/27/2020 FINDINGS: MRI HEAD FINDINGS Brain: There is a moderate-sized acute right MCA territory infarct involving the posterior frontal lobe and insula with good correlation with the earlier CTP. there is a small amount of associated petechial hemorrhage without malignant hemorrhagic transformation. Additionally, there are subcentimeter infarcts involving cortex and white matter in the left parietal and left occipital lobes which are largely subacute in appearance and which also have a small amount of associated petechial hemorrhage. There is no intracranial mass effect or extra-axial fluid collection. The ventricles are normal in size. Vascular: Major intracranial vascular flow voids are preserved. Skull and upper cervical spine: Unremarkable bone marrow signal. Sinuses/Orbits: Unremarkable orbits. Paranasal sinuses and mastoid air cells are clear. Other: None. MRA HEAD FINDINGS The visualized distal vertebral arteries are widely patent to the basilar. Patent PICA, AICA, and SCA origins are seen bilaterally. The basilar  artery is widely patent. Posterior communicating arteries are not clearly identified and may be diminutive or absent. Both PCAs are patent without evidence of a significant proximal stenosis. The internal carotid arteries are widely patent from skull base to carotid termini. ACAs and MCAs are patent without evidence of a significant proximal stenosis. As seen on the earlier CTA, there is a right M3 branch occlusion with some distal reconstitution. The scattered areas of mild branch vessel irregularity involving anterior and posterior circulation elsewhere on CTA are not apparent on this MRA. No aneurysm is identified. IMPRESSION: 1. Moderate-sized acute right MCA infarct. 2. Small subacute left parieto-occipital  infarcts. 3. Right M3 branch occlusion, otherwise negative head MRA. Electronically Signed   By: Sebastian Ache M.D.   On: 03/27/2020 17:45   ECHOCARDIOGRAM COMPLETE BUBBLE STUDY  Result Date: 03/27/2020    ECHOCARDIOGRAM REPORT   Patient Name:   Shelly Shields Date of Exam: 03/27/2020 Medical Rec #:  [id number]        Height:       61.0 in Accession #:    [id number]       Weight:       204.0 lb Date of Birth:  Feb 02, 1981        BSA:          1.905 m Patient Age:    39 years         BP:           135/87 mmHg Patient Gender: F                HR:           100 bpm. Exam Location:  Inpatient Procedure: 2D Echo, Cardiac Doppler, Color Doppler and Intracardiac            Opacification Agent Indications:    Stroke 434.91 / I63.9  History:        Patient has prior history of Echocardiogram examinations, most                 recent 06/30/2019. Risk Factors:Hypertension, Diabetes and Sleep                 Apnea. COVID-19.  Sonographer:    Elmarie Shiley Dance Referring Phys: [id number] SRISHTI L BHAGAT IMPRESSIONS  1. Left ventricular ejection fraction, by estimation, is 20 to 25%. The left ventricle has severely decreased function. There is severe hypokinesis of the basal-to-apical anteroseptum, inferoseptum and anterior LV walls . Left ventricular diastolic parameters are consistent with Grade II diastolic dysfunction (pseudonormalization).  2. Right ventricular systolic function is normal. The right ventricular size is normal.  3. Left atrial size was moderately dilated.  4. The mitral valve is normal in structure. Trivial mitral valve regurgitation.  5. The aortic valve is tricuspid. There is mild thickening of the aortic valve. Aortic valve regurgitation is not visualized.  6. The inferior vena cava is normal in size with greater than 50% respiratory variability, suggesting right atrial pressure of 3 mmHg.  7. Agitated saline contrast bubble study was negative, with no evidence of any interatrial shunt. Comparison(s): Compared to  prior echo in 06/2019, the LVEF is now severely reduced to 20-25% with severe hypokinesis of the inferoseptal, anteroseptal and anterior LV walls. Conclusion(s)/Recommendation(s): No intracardiac source of embolism detected on this transthoracic study. A transesophageal echocardiogram is recommended to exclude cardiac source of embolism if clinically indicated. FINDINGS  Left Ventricle: Left ventricular ejection fraction, by estimation, is 20 to 25%. The left  ventricle has severely decreased function. There is severe hypokinesis of the basal-to-apical anteroseptum, inferoseptum and anterior LV walls. Definity contrast agent was given IV to delineate the left ventricular endocardial borders. The left ventricular internal cavity size was normal in size. There is no left ventricular hypertrophy. Left ventricular diastolic parameters are consistent with Grade II diastolic  dysfunction (pseudonormalization). Right Ventricle: The right ventricular size is normal. No increase in right ventricular wall thickness. Right ventricular systolic function is normal. Left Atrium: Left atrial size was moderately dilated. Right Atrium: Right atrial size was normal in size. Pericardium: There is no evidence of pericardial effusion. Mitral Valve: The mitral valve is normal in structure. There is mild thickening of the mitral valve leaflet(s). Mild mitral annular calcification. Trivial mitral valve regurgitation. Tricuspid Valve: The tricuspid valve is normal in structure. Tricuspid valve regurgitation is trivial. Aortic Valve: The aortic valve is tricuspid. There is mild thickening of the aortic valve. Aortic valve regurgitation is not visualized. Pulmonic Valve: The pulmonic valve was normal in structure. Pulmonic valve regurgitation is trivial. Aorta: The aortic root and ascending aorta are structurally normal, with no evidence of dilitation. Venous: The inferior vena cava is normal in size with greater than 50% respiratory  variability, suggesting right atrial pressure of 3 mmHg. IAS/Shunts: No atrial level shunt detected by color flow Doppler. Agitated saline contrast was given intravenously to evaluate for intracardiac shunting. Agitated saline contrast bubble study was negative, with no evidence of any interatrial shunt.  LEFT VENTRICLE PLAX 2D LVIDd:         5.00 cm LVIDs:         4.50 cm LV PW:         1.30 cm LV IVS:        0.80 cm LVOT diam:     1.80 cm LV SV:         31 LV SV Index:   16 LVOT Area:     2.54 cm  RIGHT VENTRICLE             IVC RV Basal diam:  2.40 cm     IVC diam: 1.70 cm RV S prime:     13.20 cm/s TAPSE (M-mode): 1.7 cm LEFT ATRIUM             Index       RIGHT ATRIUM           Index LA diam:        4.50 cm 2.36 cm/m  RA Area:     14.10 cm LA Vol (A2C):   75.3 ml 39.53 ml/m RA Volume:   33.40 ml  17.54 ml/m LA Vol (A4C):   87.2 ml 45.78 ml/m LA Biplane Vol: 81.2 ml 42.63 ml/m  AORTIC VALVE LVOT Vmax:   74.20 cm/s LVOT Vmean:  49.200 cm/s LVOT VTI:    0.122 m  AORTA Ao Root diam: 2.90 cm Ao Asc diam:  2.80 cm MITRAL VALVE MV Area (PHT): 3.31 cm     SHUNTS MV Decel Time: 229 msec     Systemic VTI:  0.12 m MV E velocity: 120.00 cm/s  Systemic Diam: 1.80 cm MV A velocity: 85.90 cm/s MV E/A ratio:  1.40 Laurance Flatten MD Electronically signed by Laurance Flatten MD Signature Date/Time: 03/27/2020/4:25:55 PM    Final      Scheduled Meds:   . aspirin  300 mg Rectal Daily   Or  . aspirin  325 mg Oral Daily  . budesonide  0.25 mg  Inhalation BID  . carvedilol  3.125 mg Oral BID WC  . enoxaparin (LOVENOX) injection  40 mg Subcutaneous Q24H  . escitalopram  10 mg Oral Daily  . insulin aspart  0-15 Units Subcutaneous TID WC  . insulin aspart  0-5 Units Subcutaneous QHS  . insulin glargine  80 Units Subcutaneous BID  . nortriptyline  10 mg Oral QHS  . pantoprazole  40 mg Oral Daily  . potassium chloride  40 mEq Oral Q6H  . rosuvastatin  20 mg Oral q1800  . [START ON 03/30/2020]  sacubitril-valsartan  1 tablet Oral BID  . topiramate  200 mg Oral QHS    Continuous Infusions:      LOS: 2 days     Huey Bienenstock MD  To contact the attending provider between 7A-7P or the covering provider during after hours 7P-7A, please log into the web site [email or web address] and access using universal [place] password for that web site. If you do not have the password, please call the hospital operator.  03/29/2020, 1:24 PM

## 2020-03-29 NOTE — Progress Notes (Signed)
Inpatient Rehabilitation Admissions Coordinator  Inpatient rehab consult received. Discussed with Dr. Delice Lesch and then met with patient and her Mom at bedside. Therapy has updated their recommendation today to home with [facility]. Patient not in need of an inpt rehab admit prior to d/c home. Mom concerned about her SLP needs. I have updated Kelli, RN CM with TOC. We will sign off at this time.  Danne Baxter, RN, MSN Rehab Admissions Coordinator [phone] 03/29/2020 12:04 PM

## 2020-03-30 ENCOUNTER — Inpatient Hospital Stay (HOSPITAL_COMMUNITY): Payer: 59

## 2020-03-30 ENCOUNTER — Encounter (HOSPITAL_COMMUNITY): Payer: Self-pay | Admitting: Cardiology

## 2020-03-30 LAB — HOMOCYSTEINE: Homocysteine: 4.8 umol/L (ref 0.0–14.5)

## 2020-03-30 LAB — BETA-2-GLYCOPROTEIN I ABS, IGG/M/A
Beta-2 Glyco I IgG: <9 GPI IgG units (ref 0–20)
Beta-2-Glycoprotein I IgA: <9 GPI IgA units (ref 0–25)
Beta-2-Glycoprotein I IgM: <9 GPI IgM units (ref 0–32)

## 2020-03-30 LAB — LUPUS ANTICOAGULANT PANEL
DRVVT: 48.6 s — ABNORMAL HIGH (ref 0.0–47.0)
PTT Lupus Anticoagulant: 27.8 s (ref 0.0–51.9)

## 2020-03-30 LAB — ANTINUCLEAR ANTIBODIES, IFA: ANA Ab, IFA: NEGATIVE

## 2020-03-30 LAB — PROTEIN C, TOTAL: Protein C, Total: 93 % (ref 60–150)

## 2020-03-30 LAB — GLUCOSE, CAPILLARY
Glucose-Capillary: 178 mg/dL — ABNORMAL HIGH (ref 70–99)
Glucose-Capillary: 166 mg/dL — ABNORMAL HIGH (ref 70–99)

## 2020-03-30 LAB — DRVVT MIX: dRVVT Mix: 42 s — ABNORMAL HIGH (ref 0.0–40.4)

## 2020-03-30 LAB — DRVVT CONFIRM: dRVVT Confirm: 1.3 ratio — ABNORMAL HIGH (ref 0.8–1.2)

## 2020-03-30 MED ORDER — CARVEDILOL 3.125 MG PO TABS
3.1250 mg | ORAL_TABLET | Freq: Two times a day (BID) | ORAL | 1 refills | Status: DC
Start: 2020-03-30 — End: 2020-04-18

## 2020-03-30 MED ORDER — ROSUVASTATIN CALCIUM 20 MG PO TABS: 20.0000 mg | ORAL_TABLET | Freq: Every day | ORAL | 0 refills | Status: AC

## 2020-03-30 MED ORDER — BUTALBITAL-APAP-CAFFEINE 50-325-40 MG PO TABS: 1.0000 | ORAL_TABLET | Freq: Four times a day (QID) | ORAL | 0 refills | Status: AC | PRN

## 2020-03-30 MED ORDER — SACUBITRIL-VALSARTAN 24-26 MG PO TABS: 1.0000 | ORAL_TABLET | Freq: Two times a day (BID) | ORAL | 0 refills | Status: DC

## 2020-03-30 MED ORDER — INSULIN GLARGINE 100 UNIT/ML SOLOSTAR PEN
80.0000 [IU] | PEN_INJECTOR | Freq: Two times a day (BID) | SUBCUTANEOUS | Status: DC
Start: 2020-03-30 — End: 2021-03-05

## 2020-03-30 MED ORDER — APIXABAN 5 MG PO TABS: 5.0000 mg | ORAL_TABLET | Freq: Two times a day (BID) | ORAL | 0 refills | Status: DC

## 2020-03-30 NOTE — Discharge Summary (Signed)
Shelly Shields, is a 39 y.o. female  DOB [birth year]  MRN [id number].  Admission date:  03/27/2020  Admitting Physician  Karmen Bongo, MD  Discharge Date:  03/30/2020   Primary MD  [name], MD  Recommendations for primary care physician for things to follow:  -To follow with cardiology as an outpatient, to follow with Dr. Einar Gip regarding cardiomyopathy and adjusting of cardiac medications. -To follow with neurology as an outpatient. -Needs repeat CT chest as an outpatient in 6 to 8 weeks to follow on CT abdomen/2.2 cm lesion in the inferior lingula   Admission Diagnosis  Right flank pain [R10.9] Acute CVA (cerebrovascular accident) [facility]) [I63.9] Cerebrovascular accident (CVA) due to embolism of right middle cerebral artery [facility]) [I63.411]   Discharge Diagnosis  Right flank pain [R10.9] Acute CVA (cerebrovascular accident) [facility]) [I63.9] Cerebrovascular accident (CVA) due to embolism of right middle cerebral artery ([place]) [I63.411]    Principal Problem:   Acute CVA (cerebrovascular accident) ([place]) Active Problems:   Essential hypertension   Asthma, chronic   Pulmonary embolism ([place])   Sleep apnea   Diabetes mellitus without complication ([place])   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 Asthma   . COVID-19 long hauler   . Diabetes mellitus without complication ([place])   . Hypertension   . Migraines   . Obesity   . Pulmonary embolism ([place]) 05/2019   with COVID  . Sleep apnea     Past Surgical History:  Procedure Laterality Date  . BILATERAL CARPAL TUNNEL RELEASE    . TEE WITHOUT CARDIOVERSION N/A 03/28/2020   Procedure: TRANSESOPHAGEAL ECHOCARDIOGRAM (TEE);  Surgeon: Adrian Prows, MD;  Location: [place];  Service: Cardiovascular;  Laterality: N/A;       History of present illness and  Hospital Course:     Kindly see H&P for history of present  illness and admission details, please review complete Labs, Consult reports and Test reports for all details in brief  HPI  from the history and physical done on the day of admission 03/27/2020   HPI: Shelly Shields is a 39 y.o. female with medical history significant of obesity; OSA on CPAP; HTN; DM; asthma; and COVID with PE, off AC since June, presenting with dysarthria.  Dysarthria that started this AM.  She was having R flank pain yesterday.  She had urinary frequency as well.  No dysuria, hesitation.  She awoke this AM with dysarthria and difficulty dropping things.  She also had nausea and dizziness.    She had COVID in January and has had SOB since then - she uses a cane when she feels SOB.  They discussed pulmonary rehab but does not appear to have started it; she was due to see the doctor this week.     ED Course:  Possible CVA, R renal infarct.  LKW 8pm yesterday, here with L-sided weakness, dysarthria.  R internal capsule stroke on CT, not appropriate for intervention per neurology.  Also  R flank pain and nausea - CT with pyelo vs. Multiple infarcts and UA unremarkable.  Concern for embolic phenomenon.  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  Still has residual dysarthria and left hemiparesis. CT head code stroke: Findings concerning for acute or early subacute infarct involving the right insula and posterior frontal lobe (MCA territory). CTA head and neck and CT perfusion brain: Negative for large vessel occlusion but positive for right MCA M3 occlusion.  CT perfusion detects core infarct with minimally larger penumbra at the right insula/operculum. MRI brain: Moderate sized acute right MCA infarct.  Small subacute left parieto-occipital infarcts. MRA brain: Right M3 branch occlusion, otherwise negative MRA. TTE: LVEF 20-25%.  Severe hypokinesis of the basal to apical anteroseptum,  inferoseptum and anterior LV walls.  Grade 2 diastolic dysfunction.  Bubble study negative for interatrial shunt. TEE: LVEF 20%, global hypokinesis, no mural thrombus, small PFO with right-to-left shunting.  No obvious cause for cardiac source of cerebral emboli LDL 126.  Goal <70, continue Crestor 20 mg daily. A1c 11.2.  Goal <7.  Pregnancy test negative. - Treated with  Aspirin during hospital stay, Recommendation for full anticoagulation, to start on 11/5 to avoid hemoconversion, first dose of eliquis to start tomorrow.  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 S/p TEE without embolic source.  Continue monitoring on telemetry for potential arrhythmias/A. fib etc. Per cardiology, she will eventually need ischemic work-up in view of underlying cardiac risk factors including DM, HTN, HLD. -Discussed with cardiology and Neurology, overall she is beyond permissive hypertension.her blood pressure is acceptable currently, so she was started on cardiac meds gradually, started on Coreg yesterday, and Entresto today which she has been tolerating very well, to follow with cardiology as an outpatient regarding further adjustment and work-up for her cardiomyopathy.    Essential hypertension  Allow permissive hypertension initially on presentation, she is currently on Coreg and Entresto with acceptable blood pressure -Stop valsartan on discharge as she is on Entresto  Hyperlipidemia LDL 126, goal <70.  Increased Crestor 20 mg daily.  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  She is kept on her home dose Lantus 80 units twice daily with overall good control, she was instructed to follow diet compliance as an outpatient.  Right flank pain/suspect embolic right renal infarct. CT abdomen and pelvis: New 18 mm hypoattenuating lesion upper pole right kidney  with areas of subcapsular multifocal decreased enhancement in the lower pole right kidney-nonspecific features and could be related to pyelonephritis and phlegmon/evolving abscess in the upper pole right kidney cannot be excluded.  Alternatively multifocal right renal infarct. Urine microscopy not indicated above UTI: Rare bacteria and no significant pyuria or hematuria.  History of COVID-19 pneumonia in January 2021 with long-term sequelae Required hospitalization in January 2021 with treatment with remdesivir and steroids. Has persistent cough, fatigue and dyspnea on exertion and uses cane to ambulate. She had associated pulmonary embolism. Some question regarding Covid related hypercoagulability state that is persistent.  History of PE Previously on Eliquis for Covid associated PE -She is to be resumed back on Eliquis on 11/5. -Venous Doppler is negative for DVT during hospital stay.  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  No clinical bronchospasm  OSA -Continue CPAP  Abnormal finding on CT abdomen/2.2 cm lesion in the inferior lingula Relatively well-defined 2.2 cm lesion in the inferior lingula may be loculated pleural fluid or intraparenchymal fluid collection in this patient with history of pulmonary embolism and bilateral groundglass airspace disease on the previous study suspicious for multifocal atypical/viral pneumonia.  Recommend follow-up  to ensure resolution  Body mass index is 37.79 kg/m./Obesity Weight loss needs to be encouraged and follow-up with outpatient PCP/bariatric medicine.  Migraine: Continue Topamax.  I have stopped her sumatriptan, she was started on Fioricet instead.   Discharge Condition:    Follow UP   Follow-up Information    [facility] Follow up.   Specialty: Rehabilitation Why: The outpatient therapy will contact you for the first appointment Contact information: [address] [place] [phone]       Teresa Coombs, NP. Schedule an appointment as soon as possible for a visit in 2 week(s).   Specialty: Neurology Contact information: [place] [address] [phone]        Lawerance Cruel C, PA-C Follow up.   Specialty: Cardiology Contact information: [place] [address] [phone]        [name], MD Follow up on 04/03/2020.   Specialty: Family Medicine Why: Your appointment is at 11:30 am.  Contact information: [place] [address] [phone]                 Discharge Instructions  and  Discharge Medications   *  Discharge Instructions    Ambulatory referral to Occupational Therapy   Complete by: As directed    Ambulatory referral to Physical Therapy   Complete by: As directed    Ambulatory referral to Speech Therapy   Complete by: As directed    Discharge instructions   Complete by: As directed    Follow with Primary MD [name], MD in 7 days   Get CBC, CMP,checked  by Primary MD next visit.    Activity: As tolerated with Full fall precautions use walker/cane & assistance as needed  Disposition Home    Diet: Heart Healthy ,carb modified , with feeding assistance and aspiration precautions.  For Heart failure patients - Check your Weight same time  everyday, if you gain over 2 pounds, or you develop in leg swelling, experience more shortness of breath or chest pain, call your Primary MD immediately. Follow Cardiac Low Salt Diet and 1.5 lit/day fluid restriction.   On your next visit with your primary care physician please Get Medicines reviewed and adjusted.   Please request your Prim.MD to go over all Hospital Tests and Procedure/Radiological results at the follow up, please get all Hospital records sent to your Prim MD by signing hospital release before you go home.   If you experience worsening of your admission symptoms, develop shortness of breath, life threatening emergency, suicidal or homicidal thoughts you must seek medical attention immediately by calling 911 or calling your MD immediately  if symptoms less  severe.  You Must read complete instructions/literature along with all the possible adverse reactions/side effects for all the Medicines you take and that have been prescribed to you. Take any new Medicines after you have completely understood and accpet all the possible adverse reactions/side effects.   Do not drive, operating heavy machinery, perform activities at heights, swimming or participation in water activities or provide baby sitting services if your were admitted for syncope or siezures until you have seen by Primary MD or a Neurologist and advised to do so again.  Do not drive when taking Pain medications.    Do not take more than prescribed Pain, Sleep and Anxiety Medications  Special Instructions: If you have smoked or chewed Tobacco  in the last 2 yrs please stop smoking, stop any regular Alcohol  and or any Recreational drug use.  Wear Seat belts while driving.   Please note  You were cared for by a hospitalist during your hospital stay. If you have any questions about your discharge medications or the care you received while you were in the hospital after you are discharged, you can call the unit and  asked to speak with the hospitalist on call if the hospitalist that took care of you is not available. Once you are discharged, your primary care physician will handle any further medical issues. Please note that NO REFILLS for any discharge medications will be authorized once you are discharged, as it is imperative that you return to your primary care physician (or establish a relationship with a primary care physician if you do not have one) for your aftercare needs so that they can reassess your need for medications and monitor your lab values.   Increase activity slowly   Complete by: As directed      Allergies as of 03/30/2020      Reactions   Lisinopril Cough   Metformin Diarrhea   Ibuprofen Hives   Ketorolac Hives      Medication List    STOP taking these medications   medroxyPROGESTERone 150 MG/ML injection Commonly known as: DEPO-PROVERA   SUMAtriptan 100 MG tablet Commonly known as: IMITREX   valsartan 160 MG tablet Commonly known as: DIOVAN     TAKE these medications   Accu-Chek Nano SmartView w/Device Kit 1 Device by Does not apply route 4 (four) times daily - after meals and at bedtime.   accu-chek softclix lancets Use as instructed for blood glucose checks four times daily, before meals and at bedtime   apixaban 5 MG Tabs tablet Commonly known as: ELIQUIS Take 1 tablet (5 mg total) by mouth 2 (two) times daily. Start taking on: March 31, 2020 What changed: how much to take   Baclofen 5 MG Tabs TAKE 1 TABLET BY MOUTH THREE TIMES DAILY AS NEEDED FOR HEADACHE OR NECK PAIN   butalbital-acetaminophen-caffeine 50-325-40 MG tablet Commonly known as: FIORICET Take 1 tablet by mouth every 6 (six) hours as needed for headache.   carvedilol 3.125 MG tablet Commonly known as: COREG Take 1 tablet (3.125 mg total) by mouth 2 (two) times daily with a meal.   escitalopram 10 MG tablet Commonly known as: LEXAPRO Take 10 mg by mouth daily.   fexofenadine 180 MG  tablet Commonly known as: ALLEGRA Take 180 mg by mouth daily as needed.   Flovent HFA 110 MCG/ACT inhaler Generic drug: fluticasone Inhale 1 puff into the lungs daily.   fluticasone 50 MCG/ACT nasal spray Commonly known as: FLONASE Place 2 sprays into both  nostrils daily as needed for allergies.   glucose blood test strip Commonly known as: Accu-Chek SmartView Check sugar 6 x daily What changed: additional instructions   insulin glargine 100 UNIT/ML Solostar Pen Commonly known as: LANTUS Inject 80 Units into the skin 2 (two) times daily.   insulin lispro protamine-lispro (75-25) 100 UNIT/ML Susp injection Commonly known as: HUMALOG 75/25 MIX Inject 50 Units into the skin 2 (two) times daily with a meal.   Insulin Pen Needle 32G X 4 MM Misc Commonly known as: Insupen Pen Needles BD Pen Needles- brand specific. Inject insulin via insulin pen daily   nortriptyline 10 MG capsule Commonly known as: PAMELOR Take 10 mg by mouth at bedtime.   ondansetron 4 MG tablet Commonly known as: Zofran Take 1 tablet (4 mg total) by mouth every 8 (eight) hours as needed for nausea or vomiting.   pantoprazole 40 MG tablet Commonly known as: PROTONIX Take 40 mg by mouth daily.   rosuvastatin 20 MG tablet Commonly known as: CRESTOR Take 1 tablet (20 mg total) by mouth daily at 6 PM. What changed:   medication strength  how much to take  when to take this   sacubitril-valsartan 24-26 MG Commonly known as: ENTRESTO Take 1 tablet by mouth 2 (two) times daily.   topiramate 100 MG tablet Commonly known as: TOPAMAX Take 200 mg by mouth at bedtime.   TRUEplus Lancets 33G Misc USE TO CHECK BLOOD GLUCOSE QID - AFTER MEALS AND AT BEDTIME   Ventolin HFA 108 (90 Base) MCG/ACT inhaler Generic drug: albuterol Use as directed 2 puffs in the mouth or throat 4 (four) times daily as needed for shortness of breath.         Diet and Activity recommendation: See Discharge Instructions  above   Consults obtained -  Neurology Cardiology   Major procedures and Radiology Reports - PLEASE review detailed and final reports for all details, in brief -      CT Code Stroke CTA Head W/WO contrast  Result Date: 03/27/2020 CLINICAL DATA:  40 year old female code stroke presentation. Slurred speech. Plain head CT suspicious for right MCA infarct ASPECTS 8. History of diabetes, hypertension. Family history of stroke. Status post COVID-19 and PE in February. EXAM: CT ANGIOGRAPHY HEAD AND NECK CT PERFUSION BRAIN TECHNIQUE: Multidetector CT imaging of the head and neck was performed using the standard protocol during bolus administration of intravenous contrast. Multiplanar CT image reconstructions and MIPs were obtained to evaluate the vascular anatomy. Carotid stenosis measurements (when applicable) are obtained utilizing NASCET criteria, using the distal internal carotid diameter as the denominator. Multiphase CT imaging of the brain was performed following IV bolus contrast injection. Subsequent parametric perfusion maps were calculated using RAPID software. CONTRAST:  119mL OMNIPAQUE IOHEXOL 350 MG/ML SOLN COMPARISON:  plain head CT  0929 hours today. FINDINGS: CT Brain Perfusion Findings: ASPECTS: 8 CBF (<30%) Volume: 70mL Perfusion (Tmax>6.0s) volume: 12mL.  Hypoperfusion index 0.5. Mismatch Volume: 38mL Infarction Location:Right insula, operculum, largely corresponding to the plain CT findings. CTA NECK Skeleton: No acute osseous abnormality identified. Upper chest: Negative aside from mild motion artifact. Other neck: Negative. Aortic arch: 3 vessel arch configuration.  No arch atherosclerosis. Right carotid system: Negative aside from a partially retropharyngeal course. Left carotid system: Negative. Vertebral arteries: Detail of the proximal right subclavian artery and right vertebral origin partially obscured by dense right subclavian venous contrast. The right V1 segment appears normal.  The right vertebral is patent to the skull base with no  plaque or stenosis identified. Normal proximal left subclavian artery and left vertebral artery origin. Left vertebral artery is fairly codominant and within normal limits to the skull base. CTA HEAD Posterior circulation: Codominant distal vertebral arteries are within normal limits. Normal PICA origins and vertebrobasilar junction. Patent basilar artery without stenosis. AICA, SCA and PCA origins are within normal limits. Posterior communicating arteries are diminutive or absent. There is mild irregularity of the right PCA P2 segment (series 2, image 23. No significant stenosis. Otherwise bilateral PCA branches are within normal limits. Anterior circulation: Both ICA siphons are patent with no plaque or stenosis identified. Patent carotid termini. Normal MCA and ACA origins. Mildly dominant right A1. mild irregularity of the left A1 (series 10, image 22). Anterior communicating artery within normal limits. Other bilateral PCA branches are tortuous but within normal limits. Left MCA M1 segment and bifurcation are patent without stenosis. But thick MIP images on series 12 suggest mild irregularity of left MCA M2 and M3 branches. Right MCA M1 segment and right MCA trifurcation are patent without stenosis. No right MCA M2 branch occlusion or irregularity is identified. But a right M3 branch occlusion in the middle division is identified on series 12, image 14 and series 7 images 88 and 87. Other right MCA branches appear within normal limits. Venous sinuses: Early contrast timing, not evaluated. Anatomic variants: Mildly dominant right A1. Review of the MIP images confirms the above findings IMPRESSION: 1. Negative for large vessel occlusion but positive for Right MCA M3 occlusion. CT Perfusion detects core infarct with minimally larger penumbra at the right insula/operculum corresponding to the plain CT finding. Preliminary report of the above These results were  communicated to Dr. Curly [place] at 1008 hours on 03/27/2020 by text page via the [facility] messaging system. 2. Additionally, mild vessel irregularity is noted in multiple other circle-of-Willis branches, including the left A1, right P2, left M2. Although nonspecific this constellation of clinical and imaging findings might indicate accelerated branch vessel atherosclerosis. Although other large vessels appear normal, with no atherosclerosis in the neck or at the aortic arch. Electronically Signed   By: Genevie Ann M.D.   On: 03/27/2020 10:24   CT Code Stroke CTA Neck W/WO contrast  Result Date: 03/27/2020 CLINICAL DATA:  39 year old female code stroke presentation. Slurred speech. Plain head CT suspicious for right MCA infarct ASPECTS 8. History of diabetes, hypertension. Family history of stroke. Status post COVID-19 and PE in February. EXAM: CT ANGIOGRAPHY HEAD AND NECK CT PERFUSION BRAIN TECHNIQUE: Multidetector CT imaging of the head and neck was performed using the standard protocol during bolus administration of intravenous contrast. Multiplanar CT image reconstructions and MIPs were obtained to evaluate the vascular anatomy. Carotid stenosis measurements (when applicable) are obtained utilizing NASCET criteria, using the distal internal carotid diameter as the denominator. Multiphase CT imaging of the brain was performed following IV bolus contrast injection. Subsequent parametric perfusion maps were calculated using RAPID software. CONTRAST:  126mL OMNIPAQUE IOHEXOL 350 MG/ML SOLN COMPARISON:  plain head CT  0929 hours today. FINDINGS: CT Brain Perfusion Findings: ASPECTS: 8 CBF (<30%) Volume: 5mL Perfusion (Tmax>6.0s) volume: 37mL.  Hypoperfusion index 0.5. Mismatch Volume: 44mL Infarction Location:Right insula, operculum, largely corresponding to the plain CT findings. CTA NECK Skeleton: No acute osseous abnormality identified. Upper chest: Negative aside from mild motion artifact. Other neck: Negative. Aortic  arch: 3 vessel arch configuration.  No arch atherosclerosis. Right carotid system: Negative aside from a partially retropharyngeal course. Left carotid system: Negative. Vertebral arteries: Detail  of the proximal right subclavian artery and right vertebral origin partially obscured by dense right subclavian venous contrast. The right V1 segment appears normal. The right vertebral is patent to the skull base with no plaque or stenosis identified. Normal proximal left subclavian artery and left vertebral artery origin. Left vertebral artery is fairly codominant and within normal limits to the skull base. CTA HEAD Posterior circulation: Codominant distal vertebral arteries are within normal limits. Normal PICA origins and vertebrobasilar junction. Patent basilar artery without stenosis. AICA, SCA and PCA origins are within normal limits. Posterior communicating arteries are diminutive or absent. There is mild irregularity of the right PCA P2 segment (series 2, image 23. No significant stenosis. Otherwise bilateral PCA branches are within normal limits. Anterior circulation: Both ICA siphons are patent with no plaque or stenosis identified. Patent carotid termini. Normal MCA and ACA origins. Mildly dominant right A1. mild irregularity of the left A1 (series 10, image 22). Anterior communicating artery within normal limits. Other bilateral PCA branches are tortuous but within normal limits. Left MCA M1 segment and bifurcation are patent without stenosis. But thick MIP images on series 12 suggest mild irregularity of left MCA M2 and M3 branches. Right MCA M1 segment and right MCA trifurcation are patent without stenosis. No right MCA M2 branch occlusion or irregularity is identified. But a right M3 branch occlusion in the middle division is identified on series 12, image 14 and series 7 images 88 and 87. Other right MCA branches appear within normal limits. Venous sinuses: Early contrast timing, not evaluated. Anatomic  variants: Mildly dominant right A1. Review of the MIP images confirms the above findings IMPRESSION: 1. Negative for large vessel occlusion but positive for Right MCA M3 occlusion. CT Perfusion detects core infarct with minimally larger penumbra at the right insula/operculum corresponding to the plain CT finding. Preliminary report of the above These results were communicated to Dr. Curly [place] at 1008 hours on 03/27/2020 by text page via the [facility] messaging system. 2. Additionally, mild vessel irregularity is noted in multiple other circle-of-Willis branches, including the left A1, right P2, left M2. Although nonspecific this constellation of clinical and imaging findings might indicate accelerated branch vessel atherosclerosis. Although other large vessels appear normal, with no atherosclerosis in the neck or at the aortic arch. Electronically Signed   By: Genevie Ann M.D.   On: 03/27/2020 10:24   MR ANGIO HEAD WO CONTRAST  Result Date: 03/27/2020 CLINICAL DATA:  Stroke follow-up. Slurred speech. Left-sided weakness. EXAM: MRI HEAD WITHOUT CONTRAST MRA HEAD WITHOUT CONTRAST TECHNIQUE: Multiplanar, multiecho pulse sequences of the brain and surrounding structures were obtained without intravenous contrast. Angiographic images of the head were obtained using MRA technique without contrast. COMPARISON:  Head CT, CTA, and CTP 03/27/2020 FINDINGS: MRI HEAD FINDINGS Brain: There is a moderate-sized acute right MCA territory infarct involving the posterior frontal lobe and insula with good correlation with the earlier CTP. there is a small amount of associated petechial hemorrhage without malignant hemorrhagic transformation. Additionally, there are subcentimeter infarcts involving cortex and white matter in the left parietal and left occipital lobes which are largely subacute in appearance and which also have a small amount of associated petechial hemorrhage. There is no intracranial mass effect or extra-axial fluid  collection. The ventricles are normal in size. Vascular: Major intracranial vascular flow voids are preserved. Skull and upper cervical spine: Unremarkable bone marrow signal. Sinuses/Orbits: Unremarkable orbits. Paranasal sinuses and mastoid air cells are clear. Other: None. MRA HEAD FINDINGS The visualized distal  vertebral arteries are widely patent to the basilar. Patent PICA, AICA, and SCA origins are seen bilaterally. The basilar artery is widely patent. Posterior communicating arteries are not clearly identified and may be diminutive or absent. Both PCAs are patent without evidence of a significant proximal stenosis. The internal carotid arteries are widely patent from skull base to carotid termini. ACAs and MCAs are patent without evidence of a significant proximal stenosis. As seen on the earlier CTA, there is a right M3 branch occlusion with some distal reconstitution. The scattered areas of mild branch vessel irregularity involving anterior and posterior circulation elsewhere on CTA are not apparent on this MRA. No aneurysm is identified. IMPRESSION: 1. Moderate-sized acute right MCA infarct. 2. Small subacute left parieto-occipital infarcts. 3. Right M3 branch occlusion, otherwise negative head MRA. Electronically Signed   By: Sebastian Ache M.D.   On: 03/27/2020 17:45   MR BRAIN WO CONTRAST  Result Date: 03/27/2020 CLINICAL DATA:  Stroke follow-up. Slurred speech. Left-sided weakness. EXAM: MRI HEAD WITHOUT CONTRAST MRA HEAD WITHOUT CONTRAST TECHNIQUE: Multiplanar, multiecho pulse sequences of the brain and surrounding structures were obtained without intravenous contrast. Angiographic images of the head were obtained using MRA technique without contrast. COMPARISON:  Head CT, CTA, and CTP 03/27/2020 FINDINGS: MRI HEAD FINDINGS Brain: There is a moderate-sized acute right MCA territory infarct involving the posterior frontal lobe and insula with good correlation with the earlier CTP. there is a small  amount of associated petechial hemorrhage without malignant hemorrhagic transformation. Additionally, there are subcentimeter infarcts involving cortex and white matter in the left parietal and left occipital lobes which are largely subacute in appearance and which also have a small amount of associated petechial hemorrhage. There is no intracranial mass effect or extra-axial fluid collection. The ventricles are normal in size. Vascular: Major intracranial vascular flow voids are preserved. Skull and upper cervical spine: Unremarkable bone marrow signal. Sinuses/Orbits: Unremarkable orbits. Paranasal sinuses and mastoid air cells are clear. Other: None. MRA HEAD FINDINGS The visualized distal vertebral arteries are widely patent to the basilar. Patent PICA, AICA, and SCA origins are seen bilaterally. The basilar artery is widely patent. Posterior communicating arteries are not clearly identified and may be diminutive or absent. Both PCAs are patent without evidence of a significant proximal stenosis. The internal carotid arteries are widely patent from skull base to carotid termini. ACAs and MCAs are patent without evidence of a significant proximal stenosis. As seen on the earlier CTA, there is a right M3 branch occlusion with some distal reconstitution. The scattered areas of mild branch vessel irregularity involving anterior and posterior circulation elsewhere on CTA are not apparent on this MRA. No aneurysm is identified. IMPRESSION: 1. Moderate-sized acute right MCA infarct. 2. Small subacute left parieto-occipital infarcts. 3. Right M3 branch occlusion, otherwise negative head MRA. Electronically Signed   By: Sebastian Ache M.D.   On: 03/27/2020 17:45   CT ABDOMEN PELVIS W CONTRAST  Result Date: 03/27/2020 CLINICAL DATA:  Flank pain. EXAM: CT ABDOMEN AND PELVIS WITH CONTRAST TECHNIQUE: Multidetector CT imaging of the abdomen and pelvis was performed using the standard protocol following bolus administration  of intravenous contrast. CONTRAST:  5mL OMNIPAQUE IOHEXOL 300 MG/ML  SOLN COMPARISON:  CT pelvis 06/30/2019.  CT abdomen/pelvis 06/22/2019. FINDINGS: Lower chest: Relatively well-defined 2.2 cm lesion in the inferior lingula (image 4/series 5) measures water density. This may be loculated pleural fluid or intraparenchymal fluid collection in this patient with a history of pulmonary embolus and bilateral ground-glass airspace disease  on the previous study suspicious for multifocal atypical/viral pneumonia. Heart size upper normal. Hepatobiliary: No suspicious focal abnormality within the liver parenchyma. There is no evidence for gallstones, gallbladder wall thickening, or pericholecystic fluid. No intrahepatic or extrahepatic biliary dilation. Pancreas: No focal mass lesion. No dilatation of the main duct. No intraparenchymal cyst. No peripancreatic edema. Spleen: No splenomegaly. No focal mass lesion. Adrenals/Urinary Tract: No adrenal nodule or mass. New 18 mm hypoattenuating lesion identified upper pole right kidney with areas of subcapsular multifocal decreased enhancement in the lower pole right kidney. Left kidney unremarkable. No evidence for hydroureter. The urinary bladder appears normal for the degree of distention. Stomach/Bowel: Stomach is unremarkable. No gastric wall thickening. No evidence of outlet obstruction. Duodenum is normally positioned as is the ligament of Treitz. No small bowel wall thickening. No small bowel dilatation. The terminal ileum is normal. The appendix is normal. No gross colonic mass. No colonic wall thickening. Vascular/Lymphatic: No abdominal aortic aneurysm. No abdominal aortic atherosclerotic calcification. There is no gastrohepatic or hepatoduodenal ligament lymphadenopathy. No retroperitoneal or mesenteric lymphadenopathy. No pelvic sidewall lymphadenopathy. Reproductive: The uterus is unremarkable.  There is no adnexal mass. Other: No intraperitoneal free fluid.  Musculoskeletal: No worrisome lytic or sclerotic osseous abnormality. IMPRESSION: 1. New 18 mm hypoattenuating lesion upper pole right kidney with areas of subcapsular multifocal decreased enhancement in the lower pole right kidney. Imaging features are nonspecific and could be related to pyelonephritis and phlegmon/evolving abscess in the upper pole right kidney cannot be excluded. Alternatively, multifocal right renal infarct could have this appearance. Correlation with urinalysis may prove helpful. 2. Relatively well-defined 2.2 cm lesion in the inferior lingula measures water density. This may be loculated pleural fluid or intraparenchymal fluid collection in this patient with a history of pulmonary embolus and bilateral ground-glass airspace disease on the previous study suspicious for multifocal atypical/viral pneumonia. Consider follow-up to ensure resolution. Electronically Signed   By: Misty Stanley M.D.   On: 03/27/2020 12:03   CT Code Stroke Cerebral Perfusion with contrast  Result Date: 03/27/2020 CLINICAL DATA:  39 year old female code stroke presentation. Slurred speech. Plain head CT suspicious for right MCA infarct ASPECTS 8. History of diabetes, hypertension. Family history of stroke. Status post COVID-19 and PE in February. EXAM: CT ANGIOGRAPHY HEAD AND NECK CT PERFUSION BRAIN TECHNIQUE: Multidetector CT imaging of the head and neck was performed using the standard protocol during bolus administration of intravenous contrast. Multiplanar CT image reconstructions and MIPs were obtained to evaluate the vascular anatomy. Carotid stenosis measurements (when applicable) are obtained utilizing NASCET criteria, using the distal internal carotid diameter as the denominator. Multiphase CT imaging of the brain was performed following IV bolus contrast injection. Subsequent parametric perfusion maps were calculated using RAPID software. CONTRAST:  192mL OMNIPAQUE IOHEXOL 350 MG/ML SOLN COMPARISON:  plain  head CT  0929 hours today. FINDINGS: CT Brain Perfusion Findings: ASPECTS: 8 CBF (<30%) Volume: 40mL Perfusion (Tmax>6.0s) volume: 62mL.  Hypoperfusion index 0.5. Mismatch Volume: 63mL Infarction Location:Right insula, operculum, largely corresponding to the plain CT findings. CTA NECK Skeleton: No acute osseous abnormality identified. Upper chest: Negative aside from mild motion artifact. Other neck: Negative. Aortic arch: 3 vessel arch configuration.  No arch atherosclerosis. Right carotid system: Negative aside from a partially retropharyngeal course. Left carotid system: Negative. Vertebral arteries: Detail of the proximal right subclavian artery and right vertebral origin partially obscured by dense right subclavian venous contrast. The right V1 segment appears normal. The right vertebral is patent to the skull base  with no plaque or stenosis identified. Normal proximal left subclavian artery and left vertebral artery origin. Left vertebral artery is fairly codominant and within normal limits to the skull base. CTA HEAD Posterior circulation: Codominant distal vertebral arteries are within normal limits. Normal PICA origins and vertebrobasilar junction. Patent basilar artery without stenosis. AICA, SCA and PCA origins are within normal limits. Posterior communicating arteries are diminutive or absent. There is mild irregularity of the right PCA P2 segment (series 2, image 23. No significant stenosis. Otherwise bilateral PCA branches are within normal limits. Anterior circulation: Both ICA siphons are patent with no plaque or stenosis identified. Patent carotid termini. Normal MCA and ACA origins. Mildly dominant right A1. mild irregularity of the left A1 (series 10, image 22). Anterior communicating artery within normal limits. Other bilateral PCA branches are tortuous but within normal limits. Left MCA M1 segment and bifurcation are patent without stenosis. But thick MIP images on series 12 suggest mild  irregularity of left MCA M2 and M3 branches. Right MCA M1 segment and right MCA trifurcation are patent without stenosis. No right MCA M2 branch occlusion or irregularity is identified. But a right M3 branch occlusion in the middle division is identified on series 12, image 14 and series 7 images 88 and 87. Other right MCA branches appear within normal limits. Venous sinuses: Early contrast timing, not evaluated. Anatomic variants: Mildly dominant right A1. Review of the MIP images confirms the above findings IMPRESSION: 1. Negative for large vessel occlusion but positive for Right MCA M3 occlusion. CT Perfusion detects core infarct with minimally larger penumbra at the right insula/operculum corresponding to the plain CT finding. Preliminary report of the above These results were communicated to Dr. Iver Nestle at 1008 hours on 03/27/2020 by text page via the [facility] messaging system. 2. Additionally, mild vessel irregularity is noted in multiple other circle-of-Willis branches, including the left A1, right P2, left M2. Although nonspecific this constellation of clinical and imaging findings might indicate accelerated branch vessel atherosclerosis. Although other large vessels appear normal, with no atherosclerosis in the neck or at the aortic arch. Electronically Signed   By: Odessa Fleming M.D.   On: 03/27/2020 10:24   ECHOCARDIOGRAM COMPLETE BUBBLE STUDY  Result Date: 03/27/2020    ECHOCARDIOGRAM REPORT   Patient Name:   Shelly Shields Date of Exam: 03/27/2020 Medical Rec #:  [id number]        Height:       61.0 in Accession #:    [id number]       Weight:       204.0 lb Date of Birth:  11-06-1980        BSA:          1.905 m Patient Age:    39 years         BP:           135/87 mmHg Patient Gender: F                HR:           100 bpm. Exam Location:  Inpatient Procedure: 2D Echo, Cardiac Doppler, Color Doppler and Intracardiac            Opacification Agent Indications:    Stroke 434.91 / I63.9  History:        Patient  has prior history of Echocardiogram examinations, most                 recent 06/30/2019. Risk Factors:Hypertension, Diabetes and  Sleep                 Apnea. COVID-19.  Sonographer:    Jonelle Sidle Dance Referring Phys: [id number] [place]  1. Left ventricular ejection fraction, by estimation, is 20 to 25%. The left ventricle has severely decreased function. There is severe hypokinesis of the basal-to-apical anteroseptum, inferoseptum and anterior LV walls . Left ventricular diastolic parameters are consistent with Grade II diastolic dysfunction (pseudonormalization).  2. Right ventricular systolic function is normal. The right ventricular size is normal.  3. Left atrial size was moderately dilated.  4. The mitral valve is normal in structure. Trivial mitral valve regurgitation.  5. The aortic valve is tricuspid. There is mild thickening of the aortic valve. Aortic valve regurgitation is not visualized.  6. The inferior vena cava is normal in size with greater than 50% respiratory variability, suggesting right atrial pressure of 3 mmHg.  7. Agitated saline contrast bubble study was negative, with no evidence of any interatrial shunt. Comparison(s): Compared to prior echo in 06/2019, the LVEF is now severely reduced to 20-25% with severe hypokinesis of the inferoseptal, anteroseptal and anterior LV walls. Conclusion(s)/Recommendation(s): No intracardiac source of embolism detected on this transthoracic study. A transesophageal echocardiogram is recommended to exclude cardiac source of embolism if clinically indicated. FINDINGS  Left Ventricle: Left ventricular ejection fraction, by estimation, is 20 to 25%. The left ventricle has severely decreased function. There is severe hypokinesis of the basal-to-apical anteroseptum, inferoseptum and anterior LV walls. Definity contrast agent was given IV to delineate the left ventricular endocardial borders. The left ventricular internal cavity size was normal in  size. There is no left ventricular hypertrophy. Left ventricular diastolic parameters are consistent with Grade II diastolic  dysfunction (pseudonormalization). Right Ventricle: The right ventricular size is normal. No increase in right ventricular wall thickness. Right ventricular systolic function is normal. Left Atrium: Left atrial size was moderately dilated. Right Atrium: Right atrial size was normal in size. Pericardium: There is no evidence of pericardial effusion. Mitral Valve: The mitral valve is normal in structure. There is mild thickening of the mitral valve leaflet(s). Mild mitral annular calcification. Trivial mitral valve regurgitation. Tricuspid Valve: The tricuspid valve is normal in structure. Tricuspid valve regurgitation is trivial. Aortic Valve: The aortic valve is tricuspid. There is mild thickening of the aortic valve. Aortic valve regurgitation is not visualized. Pulmonic Valve: The pulmonic valve was normal in structure. Pulmonic valve regurgitation is trivial. Aorta: The aortic root and ascending aorta are structurally normal, with no evidence of dilitation. Venous: The inferior vena cava is normal in size with greater than 50% respiratory variability, suggesting right atrial pressure of 3 mmHg. IAS/Shunts: No atrial level shunt detected by color flow Doppler. Agitated saline contrast was given intravenously to evaluate for intracardiac shunting. Agitated saline contrast bubble study was negative, with no evidence of any interatrial shunt.  LEFT VENTRICLE PLAX 2D LVIDd:         5.00 cm LVIDs:         4.50 cm LV PW:         1.30 cm LV IVS:        0.80 cm LVOT diam:     1.80 cm LV SV:         31 LV SV Index:   16 LVOT Area:     2.54 cm  RIGHT VENTRICLE             IVC RV Basal diam:  2.40 cm  IVC diam: 1.70 cm RV S prime:     13.20 cm/s TAPSE (M-mode): 1.7 cm LEFT ATRIUM             Index       RIGHT ATRIUM           Index LA diam:        4.50 cm 2.36 cm/m  RA Area:     14.10 cm LA Vol  (A2C):   75.3 ml 39.53 ml/m RA Volume:   33.40 ml  17.54 ml/m LA Vol (A4C):   87.2 ml 45.78 ml/m LA Biplane Vol: 81.2 ml 42.63 ml/m  AORTIC VALVE LVOT Vmax:   74.20 cm/s LVOT Vmean:  49.200 cm/s LVOT VTI:    0.122 m  AORTA Ao Root diam: 2.90 cm Ao Asc diam:  2.80 cm MITRAL VALVE MV Area (PHT): 3.31 cm     SHUNTS MV Decel Time: 229 msec     Systemic VTI:  0.12 m MV E velocity: 120.00 cm/s  Systemic Diam: 1.80 cm MV A velocity: 85.90 cm/s MV E/A ratio:  1.40 Gwyndolyn Kaufman MD Electronically signed by Gwyndolyn Kaufman MD Signature Date/Time: 03/27/2020/4:25:55 PM    Final    CT HEAD CODE STROKE WO CONTRAST  Result Date: 03/27/2020 CLINICAL DATA:  Code stroke.  Slurred speech. EXAM: CT HEAD WITHOUT CONTRAST TECHNIQUE: Contiguous axial images were obtained from the base of the skull through the vertex without intravenous contrast. COMPARISON:  None. FINDINGS: Brain: There is loss of gray-white differentiation and subtle hypoattenuation involving the right insula and the right posterior frontal cortex and underlying white matter. Mild sulcal effacement without substantial mass effect. No midline shift. Basal cisterns are patent. No acute hemorrhage. No hydrocephalus. No mass lesion. Vascular: No definite hyperdense vessel identified. Skull: No acute fracture. Sinuses/Orbits: No acute finding. Other: No mastoid effusions. ASPECTS [facility] Stroke Program Early CT Score) - Ganglionic level infarction (caudate, lentiform nuclei, internal capsule, insula, M1-M3 cortex): 6 - Supraganglionic infarction (M4-M6 cortex): 2 Total score (0-10 with 10 being normal): 8 IMPRESSION: 1. Findings concerning for acute or early subacute infarct involving the right insula and posterior frontal lobe (MCA territory). ASPECTS is 8. 2. No substantial mass effect or acute hemorrhage. Code stroke imaging results were communicated on 03/27/2020 at 9:38 am to provider Dr. Curly Shores Via telephone, who verbally acknowledged these results.  Electronically Signed   By: Margaretha Sheffield MD   On: 03/27/2020 09:44   VAS [place] LOWER EXTREMITY VENOUS (DVT)  Result Date: 03/30/2020  Lower Venous DVT Study Indications: Stroke.  Limitations: Body habitus. Comparison Study: Priors study from 07/20/19 is available for comparison Performing Technologist: Sharion Dove RVS  Examination Guidelines: A complete evaluation includes B-mode imaging, spectral Doppler, color Doppler, and power Doppler as needed of all accessible portions of each vessel. Bilateral testing is considered an integral part of a complete examination. Limited examinations for reoccurring indications may be performed as noted. The reflux portion of the exam is performed with the patient in reverse Trendelenburg.  +---------+---------------+---------+-----------+----------+--------------+ RIGHT    CompressibilityPhasicitySpontaneityPropertiesThrombus Aging +---------+---------------+---------+-----------+----------+--------------+ CFV      Full           Yes      Yes                                 +---------+---------------+---------+-----------+----------+--------------+ SFJ      Full                                                        +---------+---------------+---------+-----------+----------+--------------+  FV Prox  Full                                                        +---------+---------------+---------+-----------+----------+--------------+ FV Mid   Full                                                        +---------+---------------+---------+-----------+----------+--------------+ FV DistalFull                                                        +---------+---------------+---------+-----------+----------+--------------+ PFV      Full                                                        +---------+---------------+---------+-----------+----------+--------------+ POP      Full           Yes      Yes                                  +---------+---------------+---------+-----------+----------+--------------+ PTV      Full                                                        +---------+---------------+---------+-----------+----------+--------------+ PERO     Full                                                        +---------+---------------+---------+-----------+----------+--------------+   +---------+---------------+---------+-----------+----------+--------------+ LEFT     CompressibilityPhasicitySpontaneityPropertiesThrombus Aging +---------+---------------+---------+-----------+----------+--------------+ CFV      Full           Yes      Yes                                 +---------+---------------+---------+-----------+----------+--------------+ SFJ      Full                                                        +---------+---------------+---------+-----------+----------+--------------+ FV Prox  Full                                                        +---------+---------------+---------+-----------+----------+--------------+  FV Mid   Full                                                        +---------+---------------+---------+-----------+----------+--------------+ FV DistalFull                                                        +---------+---------------+---------+-----------+----------+--------------+ PFV      Full                                                        +---------+---------------+---------+-----------+----------+--------------+ POP      Full           Yes      Yes                                 +---------+---------------+---------+-----------+----------+--------------+ PTV      Full                                                        +---------+---------------+---------+-----------+----------+--------------+ PERO     Full                                                         +---------+---------------+---------+-----------+----------+--------------+     Summary: BILATERAL: - No evidence of deep vein thrombosis seen in the lower extremities, bilaterally. - RIGHT: - Findings appear essentially unchanged compared to previous examination.  LEFT: - Findings appear essentially unchanged compared to previous examination.  *See table(s) above for measurements and observations.    Preliminary     Micro Results     Recent Results (from the past 240 hour(s))  Respiratory Panel by RT PCR (Flu A&B, Covid) - Nasopharyngeal Swab     Status: None   Collection Time: 03/27/20 10:04 AM   Specimen: Nasopharyngeal Swab  Result Value Ref Range Status   SARS Coronavirus 2 by RT PCR NEGATIVE NEGATIVE Final    Comment: (NOTE) SARS-CoV-2 target nucleic acids are NOT DETECTED.  The SARS-CoV-2 RNA is generally detectable in upper respiratoy specimens during the acute phase of infection. The lowest concentration of SARS-CoV-2 viral copies this assay can detect is 131 copies/mL. A negative result does not preclude SARS-Cov-2 infection and should not be used as the sole basis for treatment or other patient management decisions. A negative result may occur with  improper specimen collection/handling, submission of specimen other than nasopharyngeal swab, presence of viral mutation(s) within the areas targeted by this assay, and inadequate number of viral copies (<131 copies/mL). A negative result must be combined with clinical observations, patient history, and epidemiological information. The  expected result is Negative.  Fact Sheet for Patients:  PinkCheek.be  Fact Sheet for Healthcare Providers:  GravelBags.it  This test is no t yet approved or cleared by the [place] FDA and  has been authorized for detection and/or diagnosis of SARS-CoV-2 by FDA under an Emergency Use Authorization (EUA). This EUA will remain  in  effect (meaning this test can be used) for the duration of the COVID-19 declaration under Section 564(b)(1) of the Act, 21 U.S.C. section 360bbb-3(b)(1), unless the authorization is terminated or revoked sooner.     Influenza A by PCR NEGATIVE NEGATIVE Final   Influenza B by PCR NEGATIVE NEGATIVE Final    Comment: (NOTE) The Xpert Xpress SARS-CoV-2/FLU/RSV assay is intended as an aid in  the diagnosis of influenza from Nasopharyngeal swab specimens and  should not be used as a sole basis for treatment. Nasal washings and  aspirates are unacceptable for Xpert Xpress SARS-CoV-2/FLU/RSV  testing.  Fact Sheet for Patients: PinkCheek.be  Fact Sheet for Healthcare Providers: GravelBags.it  This test is not yet approved or cleared by the [place] FDA and  has been authorized for detection and/or diagnosis of SARS-CoV-2 by  FDA under an Emergency Use Authorization (EUA). This EUA will remain  in effect (meaning this test can be used) for the duration of the  Covid-19 declaration under Section 564(b)(1) of the Act, 21  U.S.C. section 360bbb-3(b)(1), unless the authorization is  terminated or revoked. Performed at [facility], [place] [address] [place], [place] [address]   Urine culture     Status: Abnormal   Collection Time: 03/27/20 10:24 AM   Specimen: Urine, Random  Result Value Ref Range Status   Specimen Description URINE, RANDOM  Final   Special Requests NONE  Final   Culture (A)  Final    40,000 COLONIES/mL GROUP B STREP(S.AGALACTIAE)ISOLATED TESTING AGAINST S. AGALACTIAE NOT ROUTINELY PERFORMED DUE TO PREDICTABILITY OF AMP/PEN/VAN SUSCEPTIBILITY. Performed at [facility], [place] [address] [place], [place] [address]    Report Status 03/29/2020 FINAL  Final       Today   Subjective:   Shelly Shields today has no headache,no chest abdominal pain, reports her weakness is improving, she is excited  about going home today.  .  Objective:   Blood pressure 124/84, pulse 94, temperature 98.3 F (36.8 C), temperature source Oral, resp. rate 18, height $RemoveBe'5\' 1"'hgyyeSATh$  (1.549 m), weight 90.7 kg, SpO2 96 %.   Intake/Output Summary (Last 24 hours) at 03/30/2020 1142 Last data filed at 03/29/2020 2112 Gross per 24 hour  Intake 240 ml  Output 600 ml  Net -360 ml    Exam Awake Alert, Oriented x 3, No new F.N deficits, Normal affect, she remains with left facial droop, and left side weakness upper more than lower.  But it did improve over last 24 hours. Symmetrical Chest wall movement, Good air movement bilaterally, CTAB RRR,No Gallops,Rubs or new Murmurs, No Parasternal Heave +ve B.Sounds, Abd Soft, Non tender, No organomegaly appriciated, No rebound -guarding or rigidity. No Cyanosis, Clubbing or edema, No new Rash or bruise  Data Review   CBC w Diff:  Lab Results  Component Value Date   WBC 10.4 03/29/2020   HGB 12.7 03/29/2020   HCT 38.3 03/29/2020   PLT 245 03/29/2020   LYMPHOPCT 12 03/27/2020   MONOPCT 4 03/27/2020   EOSPCT 0 03/27/2020   BASOPCT 0 03/27/2020    CMP:  Lab Results  Component Value Date  NA 139 03/29/2020   K 3.0 (L) 03/29/2020   CL 109 03/29/2020   CO2 20 (L) 03/29/2020   BUN 9 03/29/2020   CREATININE 0.81 03/29/2020   PROT 6.2 (L) 03/27/2020   ALBUMIN 3.1 (L) 03/27/2020   BILITOT 1.0 03/27/2020   ALKPHOS 82 03/27/2020   AST 13 (L) 03/27/2020   ALT 18 03/27/2020  .   Total Time in preparing paper work, data evaluation and todays exam - 8 minutes  Phillips Climes M.D on 03/30/2020 at 11:42 AM  [facility]   Office  [phone]

## 2020-03-30 NOTE — Plan of Care (Signed)
Patient discharged to go home today 

## 2020-03-30 NOTE — Progress Notes (Signed)
Occupational Therapy Treatment Patient Details Name: Shelly Shields MRN: [id number] DOB: June 24, 1980 Today's Date: 03/30/2020    History of present illness 39 yo female admitted with dysarthria L side weakness. 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 PMH HTN HLD DM2 obese OSA on cpap COVID with pulmonary embolism vaculitis   OT comments  Pt presents with apparent visual perceptual deficits, L inattention, cognitive and mild coordination deficits as noted below, decreasing independence with IADL tasks. Pt would greatly benefit from continued OT at the neuro outpt center to maximize functional level of independence. Mom present for session and educated on need for her daughter to have direct S with medication and financial management in addition to refraining from driving at this time. Mom/pt verbalized understanding.   Follow Up Recommendations  Outpatient OT;Supervision - Intermittent (direct S with IADL tasks - discussed with Mom)    Equipment Recommendations  None recommended by OT    Recommendations for Other Services      Precautions / Restrictions Precautions Precautions: Fall Precaution Comments: L inattention Restrictions Weight Bearing Restrictions: No       Mobility Bed Mobility Overal bed mobility: Modified Independent Bed Mobility: Supine to Sit          Transfers Overall transfer level: Modified independent Equipment used: None Transfers: Sit to/from Stand               Balance                     ADL either performed or assessed with clinical judgement   ADL                                         General ADL Comments: Completing bathing/dressing with set up/S. Educated Mom on need for direct S with medication and Landscape architect. Discussed IADL tasks, i.e. cooking -  needs direct S. Needs initial S with bathing/taking a shower for safety.     Vision   Additional Comments: recommend further assessemnt; able to hit targets during field tests; missing words when reading; decreased visual attention   Perception     Praxis      Cognition Arousal/Alertness: Awake/alert Behavior During Therapy: Flat affect Overall Cognitive Status: Impaired/Different from baseline Area of Impairment: Attention;Safety/judgement;Awareness;Problem solving                   Current Attention Level: Selective     Safety/Judgement: Decreased awareness of deficits Awareness: Emergent Problem Solving: Slow processing General Comments: Mom notes problems with testing and "something seems off"        Exercises     Shoulder Instructions       General Comments Apparent visual perceptual deficits; uses unorganized scanning pattern, starting on R side and working left on page half way before scanning back toward R. Skipped entire lines; unaware of moderate amount of missed targets, mostly located on L. Difficulty with clock draw task - poorly spaced numbers; hands offset. #4 inserted between # 10 and #11. Identified mistake once asked to review.     Pertinent Vitals/ Pain       Pain Assessment: No/denies pain  Home Living Family/patient expects to be discharged to:: Private residence Living Arrangements: Parent  Prior Functioning/Environment              Frequency  Min 3X/week        Progress Toward Goals  OT Goals(current goals can now be found in the care plan section)  Progress towards OT goals: Progressing toward goals  Acute Rehab OT Goals Patient Stated Goal: return to home and work OT Goal Formulation: With patient/family Time For Goal Achievement: 04/11/20 Potential to Achieve Goals: Good ADL Goals Pt Will Perform Upper Body Dressing: with modified independence Pt Will Perform Lower  Body Dressing: with modified independence Pt Will Transfer to Toilet: with modified independence Additional ADL Goal #1: pt will complete 5 step pathfinding with written instruction and three objects in L visual field during the task mod I  Plan Discharge plan needs to be updated    Co-evaluation                 AM-PAC OT "6 Clicks" Daily Activity     Outcome Measure   Help from another person eating meals?: None Help from another person taking care of personal grooming?: A Little Help from another person toileting, which includes using toliet, bedpan, or urinal?: A Little Help from another person bathing (including washing, rinsing, drying)?: A Little Help from another person to put on and taking off regular upper body clothing?: A Little Help from another person to put on and taking off regular lower body clothing?: A Little 6 Click Score: 19    End of Session    OT Visit Diagnosis: Unsteadiness on feet (R26.81);Other symptoms and signs involving cognitive function;Other symptoms and signs involving the nervous system (R29.898);Other (comment) (visual perceptual deficits)   Activity Tolerance Patient tolerated treatment well   Patient Left in bed;with call bell/phone within reach;with family/visitor present   Nurse Communication Other (comment) (DC needs)        Time: 0254-2706 OT Time Calculation (min): 30 min  Charges: OT General Charges $OT Visit: 1 Visit OT Treatments $Therapeutic Activity: 23-37 mins  Shelly Shields, OT/L   Acute OT Clinical Specialist Acute Rehabilitation Services Pager [phone] Office [phone]    [facility] 03/30/2020, 11:28 AM

## 2020-03-30 NOTE — Discharge Instructions (Signed)
Follow with Primary MD Everlean Cherry, MD in 7 days   Get CBC, CMP,checked  by Primary MD next visit.    Activity: As tolerated with Full fall precautions use walker/cane & assistance as needed  Disposition Home    Diet: Heart Healthy  , with feeding assistance and aspiration precautions.  For Heart failure patients - Check your Weight same time everyday, if you gain over 2 pounds, or you develop in leg swelling, experience more shortness of breath or chest pain, call your Primary MD immediately. Follow Cardiac Low Salt Diet and 1.5 lit/day fluid restriction.   On your next visit with your primary care physician please Get Medicines reviewed and adjusted.   Please request your Prim.MD to go over all Hospital Tests and Procedure/Radiological results at the follow up, please get all Hospital records sent to your Prim MD by signing hospital release before you go home.   If you experience worsening of your admission symptoms, develop shortness of breath, life threatening emergency, suicidal or homicidal thoughts you must seek medical attention immediately by calling 911 or calling your MD immediately  if symptoms less severe.  You Must read complete instructions/literature along with all the possible adverse reactions/side effects for all the Medicines you take and that have been prescribed to you. Take any new Medicines after you have completely understood and accpet all the possible adverse reactions/side effects.   Do not drive, operating heavy machinery, perform activities at heights, swimming or participation in water activities or provide baby sitting services if your were admitted for syncope or siezures until you have seen by Primary MD or a Neurologist and advised to do so again.  Do not drive when taking Pain medications.    Do not take more than prescribed Pain, Sleep and Anxiety Medications  Special Instructions: If you have smoked or chewed Tobacco  in the last 2 yrs please  stop smoking, stop any regular Alcohol  and or any Recreational drug use.  Wear Seat belts while driving.   Please note  You were cared for by a hospitalist during your hospital stay. If you have any questions about your discharge medications or the care you received while you were in the hospital after you are discharged, you can call the unit and asked to speak with the hospitalist on call if the hospitalist that took care of you is not available. Once you are discharged, your primary care physician will handle any further medical issues. Please note that NO REFILLS for any discharge medications will be authorized once you are discharged, as it is imperative that you return to your primary care physician (or establish a relationship with a primary care physician if you do not have one) for your aftercare needs so that they can reassess your need for medications and monitor your lab values.

## 2020-03-30 NOTE — Progress Notes (Signed)
Physical Therapy Treatment Patient Details Name: Shelly Shields MRN: [id number] DOB: November 26, 1980 Today's Date: 03/30/2020    History of Present Illness 39 yo female admitted with dysarthria L side weakness scans reveal R internal capsule and R renal multiple infarcts. PMH HTN HLD DM2 obese OSA on cpap COVID with pulmonary embolism vaculitis    PT Comments    Pt tolerates treatment well, performing multiple dynamic gait and balance tasks without loss of balance or the need for UE support. Pt does remain limited in her ability to significantly alter gait speed, but is able to complete other dynamic gait tasks as listed below. Pt will benefit from continued acute PT services to continue progressing dynamic gait and balance. Pt's mother presents concerns over possible return to work and driving, PT relays message to OT for higher level cognitive and driving assessment. PT also updated recommendations to no PT needs at the time of discharge as the pt reports her mobility feels close to baseline.   Follow Up Recommendations  No PT follow up     Equipment Recommendations  None recommended by PT    Recommendations for Other Services       Precautions / Restrictions Precautions Precautions: Fall Restrictions Weight Bearing Restrictions: No    Mobility  Bed Mobility Overal bed mobility: Modified Independent Bed Mobility: Supine to Sit     Supine to sit: Modified independent (Device/Increase time);HOB elevated        Transfers Overall transfer level: Independent Equipment used: None Transfers: Sit to/from Stand Sit to Stand: Independent            Ambulation/Gait Ambulation/Gait assistance: Modified independent (Device/Increase time) Gait Distance (Feet): 250 Feet Assistive device: None Gait Pattern/deviations: Step-through pattern Gait velocity: reduced Gait velocity interpretation: 1.31 - 2.62 ft/sec, indicative of limited community ambulator General Gait Details: pt  is able to perform head turns, stop abruptly, change direction, walk backward and sideways without LOB. Pt is only able to minimally change gait speed during session   Stairs             Wheelchair Mobility    Modified Rankin (Stroke Patients Only) Modified Rankin (Stroke Patients Only) Pre-Morbid Rankin Score: No significant disability Modified Rankin: Moderate disability     Balance Overall balance assessment: Needs assistance Sitting-balance support: No upper extremity supported;Feet supported Sitting balance-Leahy Scale: Good Sitting balance - Comments: pt able to pick up pen from floor         Single Leg Stance - Right Leg: 6 Single Leg Stance - Left Leg: 5                        Cognition Arousal/Alertness: Awake/alert Behavior During Therapy: Flat affect Overall Cognitive Status: Impaired/Different from baseline Area of Impairment: Problem solving                             Problem Solving: Slow processing        Exercises      General Comments General comments (skin integrity, edema, etc.): VSS on RA      Pertinent Vitals/Pain Pain Assessment: No/denies pain    Home Living                      Prior Function            PT Goals (current goals can now be found in the care plan section) Acute  Rehab PT Goals Patient Stated Goal: return to home and work Progress towards PT goals: Progressing toward goals    Frequency    Min 4X/week      PT Plan Discharge plan needs to be updated    Co-evaluation              AM-PAC PT "6 Clicks" Mobility   Outcome Measure  Help needed turning from your back to your side while in a flat bed without using bedrails?: None Help needed moving from lying on your back to sitting on the side of a flat bed without using bedrails?: None Help needed moving to and from a bed to a chair (including a wheelchair)?: None Help needed standing up from a chair using your arms  (e.g., wheelchair or bedside chair)?: None Help needed to walk in hospital room?: None Help needed climbing 3-5 steps with a railing? : None 6 Click Score: 24    End of Session   Activity Tolerance: Patient tolerated treatment well Patient left: Other (comment);with family/visitor present (in wheelchair with transport) Nurse Communication: Mobility status PT Visit Diagnosis: Unsteadiness on feet (R26.81);Hemiplegia and hemiparesis;Pain;Difficulty in walking, not elsewhere classified (R26.2) Hemiplegia - Right/Left: Left Hemiplegia - dominant/non-dominant: Non-dominant Hemiplegia - caused by: Cerebral infarction     Time: 7989-2119 PT Time Calculation (min) (ACUTE ONLY): 15 min  Charges:  $Gait Training: 8-22 mins                     Arlyss Gandy, PT, DPT Acute Rehabilitation Pager: [phone]    Arlyss Gandy 03/30/2020, 8:58 AM

## 2020-03-30 NOTE — Progress Notes (Signed)
Nsg Discharge Note  Admit Date:  03/27/2020 Discharge date: 03/30/2020   Shelly Shields to be D/C'd  per MD order.  AVS completed.   Patient/caregiver able to verbalize understanding.  Discharge Medication: Allergies as of 03/30/2020      Reactions   Lisinopril Cough   Metformin Diarrhea   Ibuprofen Hives   Ketorolac Hives      Medication List    STOP taking these medications   medroxyPROGESTERone 150 MG/ML injection Commonly known as: DEPO-PROVERA   SUMAtriptan 100 MG tablet Commonly known as: IMITREX   valsartan 160 MG tablet Commonly known as: DIOVAN     TAKE these medications   Accu-Chek Nano SmartView w/Device Kit 1 Device by Does not apply route 4 (four) times daily - after meals and at bedtime.   accu-chek softclix lancets Use as instructed for blood glucose checks four times daily, before meals and at bedtime   apixaban 5 MG Tabs tablet Commonly known as: ELIQUIS Take 1 tablet (5 mg total) by mouth 2 (two) times daily. Start taking on: March 31, 2020 What changed: how much to take   Baclofen 5 MG Tabs TAKE 1 TABLET BY MOUTH THREE TIMES DAILY AS NEEDED FOR HEADACHE OR NECK PAIN   butalbital-acetaminophen-caffeine 50-325-40 MG tablet Commonly known as: FIORICET Take 1 tablet by mouth every 6 (six) hours as needed for headache.   carvedilol 3.125 MG tablet Commonly known as: COREG Take 1 tablet (3.125 mg total) by mouth 2 (two) times daily with a meal.   escitalopram 10 MG tablet Commonly known as: LEXAPRO Take 10 mg by mouth daily.   fexofenadine 180 MG tablet Commonly known as: ALLEGRA Take 180 mg by mouth daily as needed.   Flovent HFA 110 MCG/ACT inhaler Generic drug: fluticasone Inhale 1 puff into the lungs daily.   fluticasone 50 MCG/ACT nasal spray Commonly known as: FLONASE Place 2 sprays into both nostrils daily as needed for allergies.   glucose blood test strip Commonly known as: Accu-Chek SmartView Check sugar 6 x daily What  changed: additional instructions   insulin glargine 100 UNIT/ML Solostar Pen Commonly known as: LANTUS Inject 80 Units into the skin 2 (two) times daily.   insulin lispro protamine-lispro (75-25) 100 UNIT/ML Susp injection Commonly known as: HUMALOG 75/25 MIX Inject 50 Units into the skin 2 (two) times daily with a meal.   Insulin Pen Needle 32G X 4 MM Misc Commonly known as: Insupen Pen Needles BD Pen Needles- brand specific. Inject insulin via insulin pen daily   nortriptyline 10 MG capsule Commonly known as: PAMELOR Take 10 mg by mouth at bedtime.   ondansetron 4 MG tablet Commonly known as: Zofran Take 1 tablet (4 mg total) by mouth every 8 (eight) hours as needed for nausea or vomiting.   pantoprazole 40 MG tablet Commonly known as: PROTONIX Take 40 mg by mouth daily.   rosuvastatin 20 MG tablet Commonly known as: CRESTOR Take 1 tablet (20 mg total) by mouth daily at 6 PM. What changed:   medication strength  how much to take  when to take this   sacubitril-valsartan 24-26 MG Commonly known as: ENTRESTO Take 1 tablet by mouth 2 (two) times daily.   topiramate 100 MG tablet Commonly known as: TOPAMAX Take 200 mg by mouth at bedtime.   TRUEplus Lancets 33G Misc USE TO CHECK BLOOD GLUCOSE QID - AFTER MEALS AND AT BEDTIME   Ventolin HFA 108 (90 Base) MCG/ACT inhaler Generic drug: albuterol Use as directed  2 puffs in the mouth or throat 4 (four) times daily as needed for shortness of breath.       Discharge Assessment: Vitals:   03/30/20 0943 03/30/20 1204  BP: 124/84 109/80  Pulse: 94 83  Resp:  18  Temp: 98.3 F (36.8 C) 98.3 F (36.8 C)  SpO2: 96% 98%   Skin clean, dry and intact without evidence of skin break down, no evidence of skin tears noted. IV catheter discontinued intact. Site without signs and symptoms of complications - no redness or edema noted at insertion site, patient denies c/o pain - only slight tenderness at site.  Dressing with  slight pressure applied.  D/c Instructions-Education: Discharge instructions given to patient/family with verbalized understanding. D/c education completed with patient/family including follow up instructions, medication list, d/c activities limitations if indicated, with other d/c instructions as indicated by MD - patient able to verbalize understanding, all questions fully answered. Patient instructed to return to ED, call 911, or call MD for any changes in condition.  Patient escorted via [place], and D/C home via private auto.  , Jolene Schimke, RN 03/30/2020 12:14 PM

## 2020-03-30 NOTE — Plan of Care (Signed)
  Problem: Education: Goal: Knowledge of disease or condition will improve Outcome: Progressing   Problem: Coping: Goal: Will verbalize positive feelings about self Outcome: Progressing   Problem: Self-Care: Goal: Ability to participate in self-care as condition permits will improve Outcome: Progressing   Problem: Self-Care: Goal: Verbalization of feelings and concerns over difficulty with self-care will improve Outcome: Progressing   Problem: Nutrition: Goal: Risk of aspiration will decrease Outcome: Progressing

## 2020-03-30 NOTE — Progress Notes (Signed)
Subjective:  Patient is currently resting comfortable without complaints today. Her mother is present at bedside.   Intake/Output from previous day:  I/O last 3 completed shifts: In: 440 [P.O.:440] Out: 600 [Urine:600] No intake/output data recorded.  Blood pressure (!) 120/91, pulse 96, temperature 98.9 F (37.2 C), temperature source Oral, resp. rate 18, height _0  (1.549 m), weight 90.7 kg, SpO2 99 %. Physical Exam Constitutional:      Appearance: She is obese.  Cardiovascular:     Rate and Rhythm: Normal rate and regular rhythm.     Pulses: Intact distal pulses.     Heart sounds: Normal heart sounds. No murmur heard.  No gallop.      Comments: No leg edema, no JVD. Pulmonary:     Effort: Pulmonary effort is normal.     Breath sounds: Normal breath sounds.  Abdominal:     General: Bowel sounds are normal.     Palpations: Abdomen is soft.  Neurological:     Mental Status: She is oriented to person, place, and time.     Cranial Nerves: Cranial nerve deficit present.     Gait: Gait abnormal.     Lab Results: BMP BNP (last 3 results) Recent Labs    07/01/19 0420 07/02/19 0500  BNP 66.4 51.1    ProBNP (last 3 results) No results for input(s): PROBNP in the last 8760 hours. BMP Latest Ref Rng & Units 03/29/2020 03/27/2020 03/27/2020  Glucose 70 - 99 mg/dL 187(H) 363(H) 373(H)  BUN 6 - 20 mg/dL 9 7 5(L)  Creatinine 0.44 - 1.00 mg/dL 0.81 0.70 0.88  Sodium 135 - 145 mmol/L 139 137 133(L)  Potassium 3.5 - 5.1 mmol/L 3.0(L) 3.7 3.6  Chloride 98 - 111 mmol/L 109 103 104  CO2 22 - 32 mmol/L 20(L) - 21(L)  Calcium 8.9 - 10.3 mg/dL 8.7(L) - 8.4(L)   Hepatic Function Latest Ref Rng & Units 03/27/2020 02/06/2020 07/02/2019  Total Protein 6.5 - 8.1 g/dL 6.2(L) 7.3 6.2(L)  Albumin 3.5 - 5.0 g/dL 3.1(L) 3.7 2.1(L)  AST 15 - 41 U/L 13(L) 18 16  ALT 0 - 44 U/L _1 Alk Phosphatase 38 - 126 U/L 82 85 77  Total Bilirubin 0.3 - 1.2 mg/dL 1.0 1.3(H) 0.9   CBC Latest Ref Rng  & Units 03/29/2020 03/27/2020 03/27/2020  WBC 4.0 - 10.5 K/uL 10.4 - 11.7(H)  Hemoglobin 12.0 - 15.0 g/dL 12.7 12.9 12.8  Hematocrit 36 - 46 % 38.3 38.0 39.3  Platelets 150 - 400 K/uL 245 - 195   Lipid Panel     Component Value Date/Time   CHOL 176 03/28/2020 0415   TRIG 106 03/28/2020 0415   HDL 29 (L) 03/28/2020 0415   CHOLHDL 6.1 03/28/2020 0415   VLDL 21 03/28/2020 0415   LDLCALC 126 (H) 03/28/2020 0415   Cardiac Panel (last 3 results) No results for input(s): CKTOTAL, CKMB, TROPONINI, RELINDX in the last 72 hours.  HEMOGLOBIN A1C Lab Results  Component Value Date   HGBA1C 11.2 (H) 03/27/2020   MPG 274.74 03/27/2020   TSH Recent Labs    03/28/20 0415  TSH 3.896   Imaging: CT Code Stroke CTA Head W/WO contrast 03/27/2020 1. Negative for large vessel occlusion but positive for Right MCA M3 occlusion. CT Perfusion detects core infarct with minimally larger penumbra at the right insula/operculum corresponding to the plain CT finding.  2. Additionally, mild vessel irregularity is noted in multiple other circle-of-Willis branches, including the left A1, right  P2, left M2. Although nonspecific this constellation of clinical and imaging findings might indicate accelerated branch vessel atherosclerosis. Although other large vessels appear normal, with no atherosclerosis in the neck or at the aortic arch.  MR ANGIO HEAD WO CONTRAST  03/27/2020  1. Moderate-sized acute right MCA infarct. 2. Small subacute left parieto-occipital infarcts. 3. Right M3 branch occlusion, otherwise negative head [id number]  CT ABDOMEN PELVIS W CONTRAST  03/27/2020  1. New 18 mm hypoattenuating lesion upper pole right kidney with areas of subcapsular multifocal decreased enhancement in the lower pole right kidney. Imaging features are nonspecific and could be related to pyelonephritis and phlegmon/evolving abscess in the upper pole right kidney cannot be excluded. Alternatively, multifocal right renal infarct  could have this appearance. Correlation with urinalysis may prove helpful.  2. Relatively well-defined 2.2 cm lesion in the inferior lingula measures water density. This may be loculated pleural fluid or intraparenchymal fluid collection in this patient with a history of pulmonary embolus and bilateral ground-glass airspace disease on the previous study suspicious for multifocal atypical/viral pneumonia. Consider follow-up to ensure resolution.  Cardiac Studies:  ECHOCARDIOGRAM COMPLETE BUBBLE STUDY  03/27/2020 1. Left ventricular ejection fraction, by estimation, is 20 to 25%. The left ventricle has severely decreased function. There is severe hypokinesis of the basal-to-apical anteroseptum, inferoseptum and anterior LV walls . Left ventricular diastolic  parameters are consistent with Grade II diastolic dysfunction (pseudonormalization). 2. Right ventricular systolic function is normal. The right ventricular size is normal. 3. Left atrial size was moderately dilated. 4. The mitral valve is normal in structure. Trivial mitral valve regurgitation. 5. The aortic valve is tricuspid. There is mild thickening of the aortic valve. Aortic valve regurgitation is not visualized. 6. The inferior vena cava is normal in size with greater than 50% respiratory variability, suggesting right atrial pressure of 3 mmHg. 7. Agitated saline contrast bubble study was negative, with no evidence of any interatrial shunt.  Comparison(s): Compared to prior echo in 06/2019, the LVEF is now severely reduced to 20-25% with severe hypokinesis of the inferoseptal, anteroseptal and anterior LV walls.  EKG:  EKG 03/27/2020: Sinus tachycardia at rate of 102 bpm, normal axis, borderline criteria for LVH.  Borderline QT elongation.  Compared to 06/30/2019, sinus tachycardia new.  No change in QT elongation.  No results found for this or any previous visit (from the past 43800 hour(s)).  Scheduled Meds: . aspirin  300 mg  Rectal Daily   Or  . aspirin  325 mg Oral Daily  . budesonide  0.25 mg Inhalation BID  . carvedilol  3.125 mg Oral BID WC  . enoxaparin (LOVENOX) injection  40 mg Subcutaneous Q24H  . escitalopram  10 mg Oral Daily  . insulin aspart  0-15 Units Subcutaneous TID WC  . insulin aspart  0-5 Units Subcutaneous QHS  . insulin glargine  80 Units Subcutaneous BID  . nortriptyline  10 mg Oral QHS  . pantoprazole  40 mg Oral Daily  . rosuvastatin  20 mg Oral q1800  . sacubitril-valsartan  1 tablet Oral BID  . topiramate  200 mg Oral QHS   Continuous Infusions: PRN Meds:.acetaminophen **OR** acetaminophen (TYLENOL) oral liquid 160 mg/5 mL **OR** acetaminophen, albuterol, senna-docusate  Assessment/Plan:  1.  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  Primary hypertension 3.  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  History of Covid pneumonia in January 2021 and also presented with pulmonary emboli  Recommendation:  Please see recommendations from yesterday.   I have reviewed labs, ANA and hypercoagulable panel are pending. She has been started on carvedilol and is tolerating well. Will start Entresto today.   Will follow up in the office after discharge as she will need further cardiac evaluation.

## 2020-03-30 NOTE — Progress Notes (Signed)
VASCULAR LAB    Bilateral lower extremity venous duplex has been performed.  See CV proc for preliminary results.   , , RVT 03/30/2020, 9:05 AM

## 2020-03-30 NOTE — TOC Transition Note (Signed)
Transition of Care [facility]) - CM/SW Discharge Note   Patient Details  Name: Shelly Shields MRN: [id number] Date of Birth: 25-May-1981  Transition of Care [facility] CM/SW Contact:  Kermit Balo, RN Phone Number: 03/30/2020, 11:16 AM   Clinical Narrative:    Pt discharging home with outpatient therapy at [facility]. Information on the AVS. Pt provided coupons for Eliquis and Entresto. PCP appt made for Monday with info on the AVS.  Pt has transport home.   Final next level of care: OP Rehab Barriers to Discharge: No Barriers Identified   Patient Goals and CMS Choice   CMS Medicare.gov Compare Post Acute Care list provided to:: Patient Represenative (must comment) Choice offered to / list presented to : Spouse  Discharge Placement                       Discharge Plan and Services                                     Social Determinants of Health (SDOH) Interventions     Readmission Risk Interventions No flowsheet data found.

## 2020-03-31 LAB — BETA-2-GLYCOPROTEIN I ABS, IGG/M/A
Beta-2 Glyco I IgG: <9 GPI IgG units (ref 0–20)
Beta-2-Glycoprotein I IgA: <9 GPI IgA units (ref 0–25)
Beta-2-Glycoprotein I IgM: <9 GPI IgM units (ref 0–32)

## 2020-03-31 LAB — LUPUS ANTICOAGULANT PANEL
DRVVT: 43.7 s (ref 0.0–47.0)
PTT Lupus Anticoagulant: 27.4 s (ref 0.0–51.9)

## 2020-03-31 LAB — FACTOR 5 LEIDEN

## 2020-03-31 LAB — PROTHROMBIN GENE MUTATION

## 2020-03-31 LAB — CARDIOLIPIN ANTIBODIES, IGG, IGM, IGA
Anticardiolipin IgA: <9 APL U/mL (ref 0–11)
Anticardiolipin IgG: <9 GPL U/mL (ref 0–14)
Anticardiolipin IgM: 13 MPL U/mL — ABNORMAL HIGH (ref 0–12)

## 2020-03-31 LAB — PROTEIN C ACTIVITY: Protein C Activity: 88 % (ref 73–180)

## 2020-03-31 LAB — PROTEIN C, TOTAL: Protein C, Total: 80 % (ref 60–150)

## 2020-03-31 LAB — PROTEIN S ACTIVITY: Protein S Activity: 77 % (ref 63–140)

## 2020-03-31 LAB — PROTEIN S, TOTAL: Protein S Ag, Total: 114 % (ref 60–150)

## 2020-04-04 LAB — PROTHROMBIN GENE MUTATION

## 2020-04-04 LAB — FACTOR 5 LEIDEN

## 2020-04-10 ENCOUNTER — Ambulatory Visit: Payer: 59 | Admitting: Occupational Therapy

## 2020-04-11 ENCOUNTER — Ambulatory Visit: Payer: 59 | Admitting: Occupational Therapy

## 2020-04-11 ENCOUNTER — Encounter: Payer: Self-pay | Admitting: Occupational Therapy

## 2020-04-11 ENCOUNTER — Ambulatory Visit: Payer: 59 | Attending: Internal Medicine

## 2020-04-11 ENCOUNTER — Other Ambulatory Visit: Payer: Self-pay

## 2020-04-11 VITALS — BP 150/84 | HR 92

## 2020-04-11 DIAGNOSIS — M6281 Muscle weakness (generalized): Secondary | ICD-10-CM

## 2020-04-11 DIAGNOSIS — R41842 Visuospatial deficit: Secondary | ICD-10-CM

## 2020-04-11 DIAGNOSIS — R278 Other lack of coordination: Secondary | ICD-10-CM

## 2020-04-11 DIAGNOSIS — R2689 Other abnormalities of gait and mobility: Secondary | ICD-10-CM | POA: Insufficient documentation

## 2020-04-11 DIAGNOSIS — R2681 Unsteadiness on feet: Secondary | ICD-10-CM | POA: Diagnosis present

## 2020-04-11 DIAGNOSIS — I69354 Hemiplegia and hemiparesis following cerebral infarction affecting left non-dominant side: Secondary | ICD-10-CM

## 2020-04-11 NOTE — Therapy (Signed)
[facility] [address] [place], [place], [address] Phone: [phone]   Fax:  [phone]  Occupational Therapy Evaluation  Patient Details  Name: CHANICE SNOKE MRN: [id number] Date of Birth: Oct 05, 1980 Referring Provider (OT): [facility] - referred - will send reports to PCP per patient's request - Dr Arlan Organ   Encounter Date: 04/11/2020   OT End of Session - 04/11/20 1846    Visit Number 1    Number of Visits 17    Date for OT Re-Evaluation 06/25/20    Authorization Type 60 combined visits OT/PT/SLP [facility])    OT Start Time 1615    OT Stop Time 1700    OT Time Calculation (min) 45 min    Activity Tolerance Patient tolerated treatment well    Behavior During Therapy [facility] for tasks assessed/performed           Past Medical History:  Diagnosis Date  . Asthma   . COVID-19 long hauler   . Diabetes mellitus without complication (HCC)   . Hypertension   . Migraines   . Obesity   . Pulmonary embolism (HCC) 05/2019   with COVID  . Sleep apnea     Past Surgical History:  Procedure Laterality Date  . BILATERAL CARPAL TUNNEL RELEASE    . TEE WITHOUT CARDIOVERSION N/A 03/28/2020   Procedure: TRANSESOPHAGEAL ECHOCARDIOGRAM (TEE);  Surgeon: Yates Decamp, MD;  Location: [facility] ENDOSCOPY;  Service: Cardiovascular;  Laterality: N/A;    There were no vitals filed for this visit.   Subjective Assessment - 04/11/20 1621    Subjective  I want to go back to work.  I don't want to sit at home - it's driving me crazy.    Pertinent History Covid, Migraines, DM, Sleep apnea    Currently in Pain? Yes    Pain Score 4     Pain Location Head    Pain Orientation Anterior    Pain Descriptors / Indicators Aching;Headache    Pain Type Chronic pain    Pain Onset In the past 7 days    Pain Frequency Intermittent    Aggravating Factors  Unsure    Pain Relieving Factors medication             [facility] OT Assessment -  04/11/20 1627      Assessment   Medical Diagnosis Acute R MCA CVA    Referring Provider (OT) Daewood Elgergawy - referred - will send reports to PCP per patient's request - Dr Arlan Organ    Onset Date/Surgical Date 03/27/20    Hand Dominance Right    Prior Therapy Acute PT/OT/SLP      Precautions   Precautions Fall      Restrictions   Weight Bearing Restrictions No      Balance Screen   Has the patient fallen in the past 6 months No      Prior Function   Level of Independence Independent with basic ADLs    Vocation Full time employment    Vocation Requirements Pharm Tech    Leisure [place] painting, [employer] basketball  games - [employer]       ADL   Eating/Feeding Independent    Grooming Independent    Therapist, sports -  Agricultural consultant with back      IADL   Prior Level of Function Shopping Independent    Shopping Needs to be accompanied on any shopping trip    Prior Level of Function Light Housekeeping Independent    Light Housekeeping Performs light daily tasks such as dishwashing, bed making    Prior Level of Function Meal Prep Independent    Meal Prep Does not utilize stove or oven;Needs to have meals prepared and served    Prior Level of Lawyer Relies on family or friends for transportation    Prior Level of Function Medication Managment Independent    Medication Management Is responsible for taking medication in correct dosages at correct time    Prior Level of Function Financial Management Independent    Financial Management Requires supervision/minimal cuing      Vision - History   Baseline Vision Wears  glasses all the time    Visual History --   astigmatism     Vision Assessment   Eye Alignment Impaired (comment)    Ocular Range of Motion Restricted on left    Alignment/Gaze Preference --   Left eye oriented medially   Tracking/Visual Pursuits Left eye does not track laterally    Saccades Decreased speed of saccadic movement    Visual Fields Left visual field deficit    Comment Reports bumping into things on left      Activity Tolerance   Activity Tolerance --   endurance limits participation - covid long hauler     Cognition   Overall Cognitive Status Impaired/Different from baseline      Posture/Postural Control   Posture/Postural Control No significant limitations    Posture Comments posterior pelvis, thoracic flexion      Sensation   Additional Comments Has had carpal tunnel surgery bilaterally - remaining numbness and tingling in palms      Coordination   Gross Motor Movements are Fluid and Coordinated Yes    Fine Motor Movements are Fluid and Coordinated No    9 Hole Peg Test Right;Left    Right 9 Hole Peg Test 28.53    Left 9 Hole Peg Test 42.16      Perception   Perception Within Functional Limits      Praxis   Praxis Intact      ROM / Strength   AROM / PROM / Strength AROM;Strength      AROM   Overall AROM  Within functional limits for tasks performed    Overall AROM Comments BUE      Strength   Overall Strength Within functional limits for tasks performed    Overall Strength Comments BUE 4/5      Hand Function   Right Hand Gross Grasp Impaired    Right Hand Grip (lbs) 65    Right Hand Lateral Pinch 10 lbs    Left Hand Gross Grasp Impaired    Left Hand Grip (lbs) 35    Left Hand Lateral Pinch 10 lbs                           OT Education - 04/11/20 1846    Education Details Summary of OT findings and potential goals    Person(s) Educated Patient    Methods Explanation    Comprehension Need further instruction;Verbalized  understanding  OT Short Term Goals - 04/11/20 1854      OT SHORT TERM GOAL #1   Title Patient will complete HEP designed to improve coordiantion in non dominant LUE    Time 4    Period Weeks    Status New    Target Date 05/26/20      OT SHORT TERM GOAL #2   Title Patient will type three full sentences using two hands on keyboard in less than 5 minutes    Time 4    Period Weeks    Status New      OT SHORT TERM GOAL #3   Title Patient will navigate through empty hallway in familiar env't without hitting any obstacles placed in left field    Time 4    Period Weeks    Status New      OT SHORT TERM GOAL #4   Title Patient will prepare a simple, familiar hot dish using stove top or oven with minimal cueing    Time 4    Period Weeks    Status New      OT SHORT TERM GOAL #5   Title Patient will demonstrate 3lb increase in grip strength in BUE's    Time 4    Period Weeks    Status New             OT Long Term Goals - 04/11/20 1900      OT LONG TERM GOAL #1   Title Patient will complete an updated HEP to address overall upper body strength and conditioning    Time 8    Period Weeks    Status New    Target Date 06/25/20      OT LONG TERM GOAL #2   Title Patient will demonstrate a 5 lb increase in BUE strength to aide in opening packages and carrying items    Time 8    Period Weeks    Status New      OT LONG TERM GOAL #3   Title Patient will demonstrate a 5 sec reduction in time on 9 hole peg test to aide in coordiantion tasks as needed for work - filling prescriptions    Time 8    Period Weeks    Status New      OT LONG TERM GOAL #4   Title Patient will demonstrate understanding of recommendations relating to returning to driving    Time 8    Period Weeks    Status New      OT LONG TERM GOAL #5   Title Patient will demonstrate understanding of recommendations relating to return to work    Time 8    Period Weeks    Status New                  Plan - 04/11/20 1848    Clinical Impression Statement Patient is a 39 yr old female admitted 03/27/20 with dysarthria and Left sided weakness, MRI indicated moderate acute R MCA CVAinfarct involving frontal and insula with small amount of associated petechial hemorrhage and small subacute left parieto-occipital infarcts. PMH HTN HLD DM2 obese OSA on cpap COVID with pulmonary embolism vaculitis.  Patient presents to OT evaluation with decreased coordiantion LUE, strength BUE, decreased visual motor skills / visual perceptual skills, and decreased cognition - attention, memory, processing.  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  OT Occupational Profile and History Detailed Assessment- Review of Records and additional review of physical, cognitive, psychosocial history related to current functional performance    Occupational performance deficits (Please refer to evaluation for details): ADL's;IADL's;Work;Leisure    Body Structure / Function / Physical Skills ADL;Coordination;Endurance;UE functional use;Balance;Decreased knowledge of use of DME;Flexibility;IADL;Vision;Dexterity;FMC;Strength    Cognitive Skills Attention;Emotional;Energy/Drive;Memory;Problem Solve;Sequencing;Safety Awareness    Rehab Potential Good    Clinical Decision Making Several treatment options, min-mod task modification necessary    Comorbidities Affecting Occupational Performance: May have comorbidities impacting occupational performance    Modification or Assistance to Complete Evaluation  Min-Moderate modification of tasks or assist with assess necessary to complete eval    OT Frequency 2x / week    OT Duration 8 weeks    OT Treatment/Interventions Self-care/ADL training;Therapeutic exercise;Visual/perceptual remediation/compensation;Patient/family education;Neuromuscular education;Therapeutic  activities;Balance training;DME and/or AE instruction;Cognitive remediation/compensation;Energy conservation;Functional Mobility Training    Plan Need further vision assessment - tabletop and environmental.  Needs coordiantion HEP    Consulted and Agree with Plan of Care Patient           Patient will benefit from skilled therapeutic intervention in order to improve the following deficits and impairments:   Body Structure / Function / Physical Skills: ADL, Coordination, Endurance, UE functional use, Balance, Decreased knowledge of use of DME, Flexibility, IADL, Vision, Dexterity, [facility], Strength Cognitive Skills: Attention, Emotional, Energy/Drive, Memory, Problem Solve, Sequencing, Safety Awareness     Visit Diagnosis: Visuospatial deficit - Plan: Ot plan of care cert/re-cert  Other lack of coordination - Plan: Ot plan of care cert/re-cert  Muscle weakness (generalized) - Plan: Ot plan of care cert/re-cert  Hemiplegia and hemiparesis following cerebral infarction affecting left non-dominant side (HCC) - Plan: Ot plan of care cert/re-cert  Unsteadiness on feet - Plan: Ot plan of care cert/re-cert    Problem List Patient Active Problem List   Diagnosis Date Noted  . Acute CVA (cerebrovascular accident) (HCC) 03/27/2020  . Sleep apnea   . Diabetes mellitus without complication (HCC)   . COVID-19 long hauler   . Obesity   . Acute pulmonary embolism (HCC) 06/30/2019  . Pulmonary embolism (HCC) 05/2019  . Hypernatremia   . Asthma, chronic   . DKA (diabetic ketoacidoses) 11/27/2014  . Diabetes mellitus, new onset (HCC)   . Hyperglycemic hyperosmolar nonketotic coma (HCC)   . Essential hypertension     Collier Salina, OTR/L 04/11/2020, 7:06 PM  [facility] [address] [place], [place], [address] Phone: [phone]   Fax:  [phone]  Name: RUBEE VEGA MRN: [id number] Date of Birth: [birth year]

## 2020-04-11 NOTE — Therapy (Signed)
[facility] [address] [place], [place], [address] Phone: [phone]   Fax:  [phone]  Physical Therapy Evaluation  Patient Details  Name: Shelly Shields MRN: [id number] Date of Birth: 01/02/1981 Referring Provider (PT): Huey Bienenstock referred (hospitalist) but PCP Arlan Organ   Encounter Date: 04/11/2020   PT End of Session - 04/11/20 1806    Visit Number 1    Number of Visits 9    Authorization Type [employer]    PT Start Time [id number]    PT Stop Time 0415    PT Time Calculation (min) 42 min    Equipment Utilized During Treatment Gait belt    Activity Tolerance Patient tolerated treatment well    Behavior During Therapy [facility] for tasks assessed/performed;Flat affect           Past Medical History:  Diagnosis Date  . Asthma   . COVID-19 long hauler   . Diabetes mellitus without complication (HCC)   . Hypertension   . Migraines   . Obesity   . Pulmonary embolism (HCC) 05/2019   with COVID  . Sleep apnea     Past Surgical History:  Procedure Laterality Date  . BILATERAL CARPAL TUNNEL RELEASE    . TEE WITHOUT CARDIOVERSION N/A 03/28/2020   Procedure: TRANSESOPHAGEAL ECHOCARDIOGRAM (TEE);  Surgeon: Yates Decamp, MD;  Location: [facility] ENDOSCOPY;  Service: Cardiovascular;  Laterality: N/A;    Vitals:   04/11/20 1815  BP: (!) 150/84  Pulse: 92      Subjective Assessment - 04/11/20 1807    Subjective Pt presents to [facility] s/p acute stroke with prior COVID complications inlcuding pulmonary embolism vaculitis. Pt presents with L inattention and some visual deficits. Pt reports she has been running into objects on the left side. Pt has some L-sided weakness as well. Pt reports wanting to get back to work and get back to PLOF.    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 PMH HTN HLD DM2  obese OSA on cpap COVID with pulmonary embolism vaculitis    Limitations Lifting;House hold activities    Currently in Pain? No/denies    Pain Score 4     Pain Location Head    Pain Orientation Anterior    Pain Descriptors / Indicators Headache;Aching    Pain Type Chronic pain    Pain Onset In the past 7 days    Pain Frequency Intermittent              [facility] PT Assessment - 04/11/20 1538      Assessment   Medical Diagnosis Acute CVA    Referring Provider (PT) Mliss Fritz Elgergawy referred (hospitalist) but PCP Arlan Organ    Onset Date/Surgical Date 03/27/20    Hand Dominance Right      Precautions   Precautions Fall      Balance Screen   Has the patient fallen in the past 6 months No    Has the patient had a decrease in activity level because of a fear of falling?  Yes    Is the patient reluctant to leave their home because of a fear of falling?  Yes      Home Environment   Living Environment Private residence    Living Arrangements Other relatives    Available Help at Discharge Family    Type of Home House  Home Access Ramped entrance    Home Layout Two level;Able to live on main level with bedroom/bathroom    Home Equipment Bedside commode;Cane - single point      Prior Function   Level of Independence Independent    Vocation Full time employment    Vocation Requirements Pharm Tech    Leisure [place] painting      Cognition   Overall Cognitive Status Impaired/Different from baseline      Observation/Other Assessments   Observations Pt with L neglect, difficulty with speech and swallowing, regular diet. Pt with visual deficits on Left.      Sensation   Light Touch Appears Intact      Coordination   Gross Motor Movements are Fluid and Coordinated Yes    Fine Motor Movements are Fluid and Coordinated Yes    Finger Nose Finger Test WNL    Heel Shin Test WNL      ROM / Strength   AROM / PROM / Strength AROM;Strength      AROM   Overall AROM  Within  functional limits for tasks performed      Strength   Overall Strength Deficits    Strength Assessment Site Hip;Knee;Ankle    Right/Left Hip Right;Left    Right Hip Flexion 4-/5    Right Hip ABduction 4/5    Right Hip ADduction 4/5    Left Hip Flexion 3/5    Left Hip ABduction 4/5    Left Hip ADduction 4-/5    Right/Left Knee Right;Left    Right Knee Flexion 4/5    Right Knee Extension 4/5    Left Knee Flexion 3/5    Left Knee Extension 3+/5    Right/Left Ankle Right;Left    Right Ankle Dorsiflexion 4+/5    Left Ankle Dorsiflexion 4/5      Transfers   Transfers Sit to Stand;Stand to Sit    Sit to Stand 5: Supervision    Five time sit to stand comments  14 s w/o UE support from standard arm chair.      Ambulation/Gait   Ambulation/Gait Yes    Ambulation/Gait Assistance 5: Supervision    Ambulation Distance (Feet) 230 Feet    Assistive device None    Gait Pattern Step-through pattern;Narrow base of support;Scissoring   RLE scissoring intermittently   Ambulation Surface Level;Indoor    Gait velocity 0.6 m/s    Stairs Yes    Stairs Assistance 4: Min guard      Functional Gait  Assessment   Gait assessed  Yes    Gait Level Surface Walks 20 ft in less than 7 sec but greater than 5.5 sec, uses assistive device, slower speed, mild gait deviations, or deviates 6-10 in outside of the 12 in walkway width.    Change in Gait Speed Makes only minor adjustments to walking speed, or accomplishes a change in speed with significant gait deviations, deviates 10-15 in outside the 12 in walkway width, or changes speed but loses balance but is able to recover and continue walking.    Gait with Horizontal Head Turns Performs head turns smoothly with slight change in gait velocity (eg, minor disruption to smooth gait path), deviates 6-10 in outside 12 in walkway width, or uses an assistive device.    Gait with Vertical Head Turns Performs task with slight change in gait velocity (eg, minor  disruption to smooth gait path), deviates 6 - 10 in outside 12 in walkway width or uses assistive device  Gait and Pivot Turn Pivot turns safely in greater than 3 sec and stops with no loss of balance, or pivot turns safely within 3 sec and stops with mild imbalance, requires small steps to catch balance.    Step Over Obstacle Is able to step over one shoe box (4.5 in total height) without changing gait speed. No evidence of imbalance.    Gait with Narrow Base of Support Is able to ambulate for 10 steps heel to toe with no staggering.    Gait with Eyes Closed Walks 20 ft, no assistive devices, good speed, no evidence of imbalance, normal gait pattern, deviates no more than 6 in outside 12 in walkway width. Ambulates 20 ft in less than 7 sec.    Ambulating Backwards Walks 20 ft, uses assistive device, slower speed, mild gait deviations, deviates 6-10 in outside 12 in walkway width.    Steps Alternating feet, must use rail.    Total Score 21    FGA comment: 21/30 indicating moderate fall risk                      Objective measurements completed on examination: See above findings.               PT Education - 04/11/20 1807    Education Details Pt educated on POC and findings upon evaluations.    Person(s) Educated Patient;Parent(s)    Methods Explanation;Demonstration    Comprehension Verbalized understanding            PT Short Term Goals - 04/11/20 1821      PT SHORT TERM GOAL #1   Title Pt will be independent with initial HEP.    Baseline NA    Time 4    Period Weeks    Status New    Target Date 05/09/20      PT SHORT TERM GOAL #2   Title Pt will increase FGA to >/= 25/30 for improved functional balance.    Baseline 21/30 04/11/20    Time 4    Period Weeks    Status New    Target Date 05/09/20      PT SHORT TERM GOAL #3   Title Pt will improve gait speed to 0.7 m/s for improved access to community.    Baseline 0.6 m/s 04/11/20    Time 4     Period Weeks    Status New    Target Date 05/09/20      PT SHORT TERM GOAL #4   Title Pt will be able to ambulate 500+ feet with <4 RPE with no AD and supervsion.    Baseline NA    Time 4    Period Weeks    Status New    Target Date 05/09/20             PT Long Term Goals - 04/11/20 1824      PT LONG TERM GOAL #1   Title Pt will be independent with progressive HEP.    Baseline NA    Time 8    Period Weeks    Status New    Target Date 06/06/20      PT LONG TERM GOAL #2   Title Pt will increase FOTO from 68 to >/= 80.    Baseline 68 04/11/20    Time 8    Period Weeks    Status New    Target Date 06/06/20      PT LONG TERM  GOAL #3   Title Pt will be able to ambulate 1,000+ feet with <4 RPE with no AD and supervsion.    Baseline NT    Time 8    Period Weeks    Status New    Target Date 06/06/20      PT LONG TERM GOAL #4   Title will be assessed with goal to be written.    Baseline NT    Time 8    Period Weeks    Status New    Target Date 06/06/20      PT LONG TERM GOAL #5   Title Pt will score >27 on FGA for improved dynamic balance.    Baseline 21/30 04/11/20    Time 8    Period Weeks    Status New    Target Date 06/06/20                  Plan - 04/11/20 1813    Clinical Impression Statement Pt presents to [facility] s/p acute stroke with prior COVID complications inlcuding pulmonary embolism vaculitis. Pt presents with L inattention and some visual deficits. Pt reports she has been running into objects on the left side. Pt has some L-sided weakness as well. Pt reports wanting to get back to work and get back to PLOF. Pt scores 21/30 on the FGA indicating increased risk for falls. Pt requires 14 s on 5xSTS and gait speed is 0.6 m/s, indicating increased risk for falls. Pt presents with narrow BOS, RLE scissoring and step-through gait pattern with no AD. Pt exhibits good foot clearance bilaterally. PT to assess for endurance next visit. Pt limited  by fatigue. Pt would benefit from skilled PT services for 1x/week x 8 weeks in order to address LLE weakness, balance deficits, and endurance deficits.    Personal Factors and Comorbidities Past/Current Experience;Comorbidity 3+    Comorbidities PMH: HTN HLD DM2 obese OSA on cpap COVID with pulmonary embolism vaculitis    Examination-Activity Limitations Squat;Stairs;Locomotion Level;Lift    Examination-Participation Restrictions Occupation;Community Activity    Stability/Clinical Decision Making Evolving/Moderate complexity    Clinical Decision Making Moderate    Rehab Potential Good    PT Frequency 1x / week   plus eval   PT Duration 8 weeks    PT Treatment/Interventions Gait training;Stair training;Functional mobility training;Therapeutic activities;Therapeutic exercise;Balance training;Neuromuscular re-education;Patient/family education;ADLs/Self Care Home Management;Visual/perceptual remediation/compensation;Energy conservation;Vestibular;Manual techniques    PT Next Visit Plan Assess for endurance. Begin high level balance and endurance training. Initiate HEP.    Consulted and Agree with Plan of Care Patient           Patient will benefit from skilled therapeutic intervention in order to improve the following deficits and impairments:  Abnormal gait, Obesity, Decreased strength, Decreased balance, Improper body mechanics, Impaired vision/preception, Impaired perceived functional ability, Difficulty walking, Decreased coordination, Cardiopulmonary status limiting activity, Decreased activity tolerance, Decreased endurance  Visit Diagnosis: Hemiplegia and hemiparesis following cerebral infarction affecting left non-dominant side (HCC)  Muscle weakness (generalized)  Unsteadiness on feet  Other abnormalities of gait and mobility     Problem List Patient Active Problem List   Diagnosis Date Noted  . Acute CVA (cerebrovascular accident) (HCC) 03/27/2020  . Sleep apnea   .  Diabetes mellitus without complication (HCC)   . COVID-19 long hauler   . Obesity   . Acute pulmonary embolism (HCC) 06/30/2019  . Pulmonary embolism (HCC) 05/2019  . Hypernatremia   . Asthma, chronic   .  DKA (diabetic ketoacidoses) 11/27/2014  . Diabetes mellitus, new onset (HCC)   . Hyperglycemic hyperosmolar nonketotic coma (HCC)   . Essential hypertension     Isabella Bowens, SPT 04/12/2020, 8:29 AM  [facility] [address] [place], [place], [address] Phone: [phone]   Fax:  [phone]  Name: Shelly Shields MRN: [id number] Date of Birth: 01-23-1981

## 2020-04-13 ENCOUNTER — Encounter: Payer: Self-pay | Admitting: Occupational Therapy

## 2020-04-13 ENCOUNTER — Ambulatory Visit: Payer: 59 | Admitting: Occupational Therapy

## 2020-04-13 ENCOUNTER — Other Ambulatory Visit: Payer: Self-pay

## 2020-04-13 DIAGNOSIS — R41842 Visuospatial deficit: Secondary | ICD-10-CM

## 2020-04-13 DIAGNOSIS — I69354 Hemiplegia and hemiparesis following cerebral infarction affecting left non-dominant side: Secondary | ICD-10-CM | POA: Diagnosis not present

## 2020-04-13 DIAGNOSIS — M6281 Muscle weakness (generalized): Secondary | ICD-10-CM

## 2020-04-13 DIAGNOSIS — R278 Other lack of coordination: Secondary | ICD-10-CM

## 2020-04-13 NOTE — Therapy (Signed)
[facility] [address] [place], [place], [address] Phone: [phone]   Fax:  [phone]  Occupational Therapy Treatment  Patient Details  Name: Shelly Shields MRN: [id number] Date of Birth: August 18, 1980 Referring Provider (OT): [facility] - referred - will send reports to PCP per patient's request - Dr Arlan Organ   Encounter Date: 04/13/2020   OT End of Session - 04/13/20 1158    Visit Number 2    Number of Visits 17    Date for OT Re-Evaluation 06/25/20    Authorization Type 60 combined visits OT/PT/SLP [facility])    OT Start Time 1146    OT Stop Time 1228    OT Time Calculation (min) 42 min    Activity Tolerance Patient tolerated treatment well    Behavior During Therapy [facility] for tasks assessed/performed           Past Medical History:  Diagnosis Date  . Asthma   . COVID-19 long hauler   . Diabetes mellitus without complication (HCC)   . Hypertension   . Migraines   . Obesity   . Pulmonary embolism (HCC) 05/2019   with COVID  . Sleep apnea     Past Surgical History:  Procedure Laterality Date  . BILATERAL CARPAL TUNNEL RELEASE    . TEE WITHOUT CARDIOVERSION N/A 03/28/2020   Procedure: TRANSESOPHAGEAL ECHOCARDIOGRAM (TEE);  Surgeon: Yates Decamp, MD;  Location: [facility] ENDOSCOPY;  Service: Cardiovascular;  Laterality: N/A;    There were no vitals filed for this visit.   Subjective Assessment - 04/13/20 1147    Subjective  Denies pain    Currently in Pain? No/denies                 Treatment: Tabletop scanning number cancellation 1.5 M only 2/80 missed, pt demonstrates organized scan pattern. Copying small peg design with LUE for fine motor coordination with a visual component, min difficulty for coordination, pt copied design correctly. Environmental scanning task 9/ 14 missed first pass, pt located 3 more on second pass and the 3rd item on last pass. Majority of items were in inferior  visual field.               OT Education - 04/13/20 1159    Education Details coordination HEP    Person(s) Educated Patient    Methods Explanation;Demonstration;Handout    Comprehension Verbalized understanding;Returned demonstration;Verbal cues required            OT Short Term Goals - 04/11/20 1854      OT SHORT TERM GOAL #1   Title Patient will complete HEP designed to improve coordiantion in non dominant LUE    Time 4    Period Weeks    Status New    Target Date 05/26/20      OT SHORT TERM GOAL #2   Title Patient will type three full sentences using two hands on keyboard in less than 5 minutes    Time 4    Period Weeks    Status New      OT SHORT TERM GOAL #3   Title Patient will navigate through empty hallway in familiar env't without hitting any obstacles placed in left field    Time 4    Period Weeks    Status New      OT SHORT TERM GOAL #4   Title Patient will prepare a simple, familiar hot dish using stove top or oven with minimal cueing  Time 4    Period Weeks    Status New      OT SHORT TERM GOAL #5   Title Patient will demonstrate 3lb increase in grip strength in BUE's    Time 4    Period Weeks    Status New             OT Long Term Goals - 04/11/20 1900      OT LONG TERM GOAL #1   Title Patient will complete an updated HEP to address overall upper body strength and conditioning    Time 8    Period Weeks    Status New    Target Date 06/25/20      OT LONG TERM GOAL #2   Title Patient will demonstrate a 5 lb increase in BUE strength to aide in opening packages and carrying items    Time 8    Period Weeks    Status New      OT LONG TERM GOAL #3   Title Patient will demonstrate a 5 sec reduction in time on 9 hole peg test to aide in coordiantion 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 She demonstrates understanding of coordination HEP.    OT Occupational Profile and History Detailed Assessment- Review of Records and additional review of physical, cognitive, psychosocial history related to current functional performance    Occupational performance deficits (Please refer to evaluation for details): ADL's;IADL's;Work;Leisure    Body Structure / Function / Physical Skills ADL;Coordination;Endurance;UE functional use;Balance;Decreased knowledge of use of DME;Flexibility;IADL;Vision;Dexterity;FMC;Strength    OT Frequency 2x / week    OT Duration 8 weeks    OT Treatment/Interventions Self-care/ADL training;Therapeutic exercise;Visual/perceptual remediation/compensation;Patient/family education;Neuromuscular education;Therapeutic activities;Balance training;DME and/or AE instruction;Cognitive remediation/compensation;Energy conservation;Functional Mobility Training    Plan issue visual compensation strategies, pt may benefit from vision HEP, continue to work towards goals    Consulted and Agree with Plan of Care Patient           Patient will benefit from skilled therapeutic intervention in order to improve the following deficits and impairments:   Body Structure / Function / Physical Skills: ADL, Coordination, Endurance, UE functional use, Balance, Decreased knowledge of use of DME, Flexibility, IADL, Vision, Dexterity, FMC, Strength       Visit Diagnosis: Visuospatial deficit  Other lack of coordination  Muscle weakness (generalized)  Hemiplegia and hemiparesis following cerebral infarction affecting left non-dominant side  [facility])    Problem List Patient Active Problem List   Diagnosis Date Noted  . Acute CVA (cerebrovascular accident) (HCC) 03/27/2020  . Sleep apnea   . Diabetes mellitus without complication (HCC)   . COVID-19 long hauler   . Obesity   . Acute pulmonary embolism (HCC) 06/30/2019  . Pulmonary embolism (HCC) 05/2019  .  Hypernatremia   . Asthma, chronic   . DKA (diabetic ketoacidoses) 11/27/2014  . Diabetes mellitus, new onset (HCC)   . Hyperglycemic hyperosmolar nonketotic coma (HCC)   . Essential hypertension     , 04/13/2020, 12:27 PM Keene Breath, OTR/L [phone] Phone: [phone] 12:58 PM 04/13/20 [facility] [address] [place], [place], [address] Phone: [phone]   Fax:  [phone]  Name: Shelly Shields MRN: [id number] Date of Birth: 25-Mar-1981

## 2020-04-13 NOTE — Patient Instructions (Signed)
  Coordination Activities  Perform the following activities for 20 minutes 1 times per day with left hand(s).   Rotate ball in fingertips (clockwise and counter-clockwise).  Toss ball between hands.  Toss ball in air and catch with the same hand.  Flip cards 1 at a time as fast as you can.  Deal cards with your thumb (Hold deck in hand and push card off top with thumb).  Pick up coins and place in container or coin bank.  Pick up coins and stack.  Pick up coins one at a time until you get 5-10 in your hand, then move coins from palm to fingertips to stack one at a time. 

## 2020-04-18 ENCOUNTER — Other Ambulatory Visit: Payer: Self-pay

## 2020-04-18 ENCOUNTER — Ambulatory Visit: Payer: 59 | Admitting: Student

## 2020-04-18 ENCOUNTER — Encounter: Payer: Self-pay | Admitting: Student

## 2020-04-18 VITALS — BP 128/94 | HR 88 | Resp 16 | Ht 61.0 in | Wt 203.0 lb

## 2020-04-18 DIAGNOSIS — Z86711 Personal history of pulmonary embolism: Secondary | ICD-10-CM

## 2020-04-18 DIAGNOSIS — I420 Dilated cardiomyopathy: Secondary | ICD-10-CM

## 2020-04-18 DIAGNOSIS — I10 Essential (primary) hypertension: Secondary | ICD-10-CM

## 2020-04-18 DIAGNOSIS — Z8616 Personal history of COVID-19: Secondary | ICD-10-CM

## 2020-04-18 DIAGNOSIS — Z794 Long term (current) use of insulin: Secondary | ICD-10-CM

## 2020-04-18 DIAGNOSIS — E119 Type 2 diabetes mellitus without complications: Secondary | ICD-10-CM

## 2020-04-18 DIAGNOSIS — I6930 Unspecified sequelae of cerebral infarction: Secondary | ICD-10-CM

## 2020-04-18 DIAGNOSIS — E7800 Pure hypercholesterolemia, unspecified: Secondary | ICD-10-CM

## 2020-04-18 DIAGNOSIS — G4730 Sleep apnea, unspecified: Secondary | ICD-10-CM

## 2020-04-18 DIAGNOSIS — I5022 Chronic systolic (congestive) heart failure: Secondary | ICD-10-CM

## 2020-04-18 MED ORDER — CARVEDILOL 3.125 MG PO TABS: 6.2500 mg | ORAL_TABLET | Freq: Two times a day (BID) | ORAL | 1 refills | Status: DC

## 2020-04-18 NOTE — Progress Notes (Signed)
Primary Physician/Referring:  [name], MD  Patient ID: Shelly Shields, female    DOB: 03-06-1981, 39 y.o.   MRN: [id number]  Chief Complaint  Patient presents with  . New Patient (Initial Visit)  . Cardiomyopathy  . Hypertension  . Cerebrovascular Accident   HPI:    Shelly Shields  is a 39 y.o. African-American female with hypertension, hyperlipidemia, uncontrolled diabetes mellitus, obesity and obstructive sleep apnea on CPAP and history of Covid pneumonia in January 2021 and also had pulmonary embolism during Covid infection.  She is also being evaluated for vasculitis. 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 Cardiomyopathy etiology likely stress cardiomyopathy vs significant coronary artery disease vs small PFO noted on TEE vs atrial fibrillation.   She presents for hospital follow up. She is presently doing well and reports she feels much better. Denies chest pain, palpitations, dyspnea, leg swelling, dizziness, syncope, near syncope. Patient is presently working with occupational and speech therapy for residual defects following CVA.   Hospital Recommendations 03/29/2020 via Dr. Einar Gip:  New onset dilated cardiomyopathy could be either stress cardiomyopathy versus significant coronary artery disease in view of multiple cardiovascular factors including hypertension, hyperlipidemia and diabetes mellitus which is uncontrolled.  She will eventually need outpatient cardiac work-up either a stress test or a cardiac catheterization.  With regard to embolic complications, CVA and also renal infarcts all suggest cardiac etiology.  Question is whether the small PFO noted on the TEE is a culprit or a innocent bystander as it is very small, patient also has risk factors for atrial fibrillation including obesity, hypertension, severe obstructive sleep apnea, diabetes  mellitus.  Anticoagulation will probably be best option in view of coagulopathy and hypercoagulable state.  I have also sent for ANA and hypercoagulable panel.  With regard to nonischemic versus ischemic cardiomyopathy with severe LV systolic dysfunction, would recommend starting her back on carvedilol, as he is hypertensive could start at 6.25 mg p.o. twice daily and also Entresto 24/26 mg twice daily.  I will leave it for the primary team to make changes to her antihypertensive medications as they are managing her stroke as well.  I have discussed with Dr. Phillips Climes.  Past Medical History:  Diagnosis Date  . Asthma   . COVID-19 long hauler   . Diabetes mellitus without complication ([place])   . Hypertension   . Migraines   . Obesity   . Pulmonary embolism ([place]) 05/2019   with COVID  . Sleep apnea    Past Surgical History:  Procedure Laterality Date  . BILATERAL CARPAL TUNNEL RELEASE    . TEE WITHOUT CARDIOVERSION N/A 03/28/2020   Procedure: TRANSESOPHAGEAL ECHOCARDIOGRAM (TEE);  Surgeon: Adrian Prows, MD;  Location: [facility] ENDOSCOPY;  Service: Cardiovascular;  Laterality: N/A;   Family History  Problem Relation Age of Onset  . Hypertension Mother   . Thyroid disease Mother   . Hypertension Father   . Stroke Father 17  . Diabetes Father   . Sleep apnea Father   . CVA Paternal Uncle 90    Social History   Tobacco Use  . Smoking status: Never Smoker  . Smokeless tobacco: Never Used  Substance Use Topics  . Alcohol use: Yes    Alcohol/week: 0.0 standard drinks    Comment: occasional    Marital Status: Single   ROS  Review of Systems  Constitutional: Negative for malaise/fatigue and weight gain.  Cardiovascular:  Negative for chest pain, claudication, leg swelling, near-syncope, orthopnea, palpitations, paroxysmal nocturnal dyspnea and syncope.  Respiratory: Negative for shortness of breath.   Hematologic/Lymphatic: Does not bruise/bleed easily.  Gastrointestinal:  Negative for melena.  Neurological: Negative for dizziness and weakness.    Objective  Blood pressure (!) 128/94, pulse 88, resp. rate 16, height _0  (1.549 m), weight 203 lb (92.1 kg), SpO2 100 %.  Vitals with BMI 04/18/2020 04/11/2020 03/30/2020  Height _1  - -  Weight 203 lbs - -  BMI 16.10 - -  Systolic 960 454 098  Diastolic 94 84 80  Pulse 88 92 83      Physical Exam Vitals reviewed.  Constitutional:      Appearance: She is obese.  HENT:     Head: Normocephalic and atraumatic.  Cardiovascular:     Rate and Rhythm: Normal rate and regular rhythm.     Pulses: Intact distal pulses.     Heart sounds: S1 normal and S2 normal. No murmur heard.  No gallop.      Comments: Trace bilateral lower leg edema. No JVD.  Pulmonary:     Effort: Pulmonary effort is normal. No respiratory distress.     Breath sounds: No wheezing, rhonchi or rales.  Musculoskeletal:     Right lower leg: No edema.     Left lower leg: No edema.  Neurological:     Mental Status: She is alert.     Cranial Nerves: Cranial nerve deficit present.     Gait: Gait abnormal (Ambulates with cane ).     Laboratory examination:   Recent Labs    07/01/19 0420 07/01/19 0420 07/02/19 0500 07/02/19 0500 02/06/20 2118 02/06/20 2118 03/27/20 0946 03/27/20 1002 03/29/20 0229  NA 137   < > 138   < > 139   < > 133* 137 139  K 3.3*   < > 3.5   < > 4.0   < > 3.6 3.7 3.0*  CL 101   < > 108   < > 108   < > 104 103 109  CO2 23   < > 20*   < > 21*  --  21*  --  20*  GLUCOSE 195*   < > 153*   < > 238*   < > 373* 363* 187*  BUN 12   < > 12   < > 14   < > 5* 7 9  CREATININE 0.77   < > 0.75   < > 1.13*   < > 0.88 0.70 0.81  CALCIUM 9.0   < > 9.1   < > 9.0  --  8.4*  --  8.7*  GFRNONAA >60   < > >60   < > >60  --  >60  --  >60  GFRAA >60  --  >60  --  >60  --   --   --   --    < > = values in this interval not displayed.   estimated creatinine clearance is 95.5 mL/min (by C-G formula based on SCr of 0.81 mg/dL).   CMP Latest Ref Rng & Units 03/29/2020 03/27/2020 03/27/2020  Glucose 70 - 99 mg/dL 187(H) 363(H) 373(H)  BUN 6 - 20 mg/dL 9 7 5(L)  Creatinine 0.44 - 1.00 mg/dL 0.81 0.70 0.88  Sodium 135 - 145 mmol/L 139 137 133(L)  Potassium 3.5 - 5.1 mmol/L 3.0(L) 3.7 3.6  Chloride 98 - 111 mmol/L 109 103 104  CO2 22 -  32 mmol/L 20(L) - 21(L)  Calcium 8.9 - 10.3 mg/dL 8.7(L) - 8.4(L)  Total Protein 6.5 - 8.1 g/dL - - 6.2(L)  Total Bilirubin 0.3 - 1.2 mg/dL - - 1.0  Alkaline Phos 38 - 126 U/L - - 82  AST 15 - 41 U/L - - 13(L)  ALT 0 - 44 U/L - - 18   CBC Latest Ref Rng & Units 03/29/2020 03/27/2020 03/27/2020  WBC 4.0 - 10.5 K/uL 10.4 - 11.7(H)  Hemoglobin 12.0 - 15.0 g/dL 12.7 12.9 12.8  Hematocrit 36 - 46 % 38.3 38.0 39.3  Platelets 150 - 400 K/uL 245 - 195    Lipid Panel Recent Labs    03/28/20 0415  CHOL 176  TRIG 106  LDLCALC 126*  VLDL 21  HDL 29*  CHOLHDL 6.1    HEMOGLOBIN A1C Lab Results  Component Value Date   HGBA1C 11.2 (H) 03/27/2020   MPG 274.74 03/27/2020   TSH Recent Labs    03/28/20 0415  TSH 3.896    External labs:  None   Medications and allergies   Allergies  Allergen Reactions  . Lisinopril Cough  . Metformin Diarrhea  . Ibuprofen Hives  . Ketorolac Hives     Outpatient Medications Prior to Visit  Medication Sig Dispense Refill  . apixaban (ELIQUIS) 5 MG TABS tablet Take 1 tablet (5 mg total) by mouth 2 (two) times daily. 60 tablet 0  . Baclofen 5 MG TABS TAKE 1 TABLET BY MOUTH THREE TIMES DAILY AS NEEDED FOR HEADACHE OR NECK PAIN    . Blood Glucose Monitoring Suppl (ACCU-CHEK NANO SMARTVIEW) W/DEVICE KIT 1 Device by Does not apply route 4 (four) times daily - after meals and at bedtime. 1 kit 0  . butalbital-acetaminophen-caffeine (FIORICET) 50-325-40 MG tablet Take 1 tablet by mouth every 6 (six) hours as needed for headache. 20 tablet 0  . escitalopram (LEXAPRO) 10 MG tablet Take 10 mg by mouth daily.    . fexofenadine (ALLEGRA) 180 MG tablet  Take 180 mg by mouth daily as needed.     . fluticasone (FLONASE) 50 MCG/ACT nasal spray Place 2 sprays into both nostrils daily as needed for allergies.   3  . fluticasone (FLOVENT HFA) 110 MCG/ACT inhaler Inhale 1 puff into the lungs daily.     Marland Kitchen glucose blood (ACCU-CHEK SMARTVIEW) test strip Check sugar 6 x daily (Patient taking differently: Check sugar 6 x daily. Uses true test instead) 200 each 3  . insulin glargine (LANTUS) 100 UNIT/ML Solostar Pen Inject 80 Units into the skin 2 (two) times daily.    . insulin lispro protamine-lispro (HUMALOG 75/25 MIX) (75-25) 100 UNIT/ML SUSP injection Inject 50 Units into the skin 3 (three) times daily. Sliding scale    . Insulin Pen Needle (INSUPEN PEN NEEDLES) 32G X 4 MM MISC BD Pen Needles- brand specific. Inject insulin via insulin pen daily 200 each 3  . Lancet Devices (ACCU-CHEK SOFTCLIX) lancets Use as instructed for blood glucose checks four times daily, before meals and at bedtime 100 each 5  . medroxyPROGESTERone (DEPO-PROVERA) 150 MG/ML injection Inject 150 mg into the muscle every 3 (three) months.    . montelukast (SINGULAIR) 10 MG tablet Take 10 mg by mouth at bedtime.    . nortriptyline (PAMELOR) 10 MG capsule Take 10 mg by mouth at bedtime.    . ondansetron (ZOFRAN) 4 MG tablet Take 1 tablet (4 mg total) by mouth every 8 (eight) hours as needed for nausea or vomiting.  20 tablet 0  . pantoprazole (PROTONIX) 40 MG tablet Take 40 mg by mouth daily.    . rosuvastatin (CRESTOR) 20 MG tablet Take 1 tablet (20 mg total) by mouth daily at 6 PM. 30 tablet 0  . sacubitril-valsartan (ENTRESTO) 24-26 MG Take 1 tablet by mouth 2 (two) times daily. 60 tablet 0  . topiramate (TOPAMAX) 100 MG tablet Take 200 mg by mouth at bedtime.     . TRUEPLUS LANCETS 33G MISC USE TO CHECK BLOOD GLUCOSE QID - AFTER MEALS AND AT BEDTIME  3  . VENTOLIN HFA 108 (90 BASE) MCG/ACT inhaler Use as directed 2 puffs in the mouth or throat 4 (four) times daily as needed for  shortness of breath.   3  . carvedilol (COREG) 3.125 MG tablet Take 1 tablet (3.125 mg total) by mouth 2 (two) times daily with a meal. 60 tablet 1   No facility-administered medications prior to visit.     Radiology:   No results found. CT Code Stroke CTA Head W/WO contrast 03/27/2020 1. Negative for large vessel occlusion but positive for Right MCA M3 occlusion. CT Perfusion detects core infarct with minimally larger penumbra at the right insula/operculum corresponding to the plain CT finding.  2. Additionally, mild vessel irregularity is noted in multiple other circle-of-Willis branches, including the left A1, right P2, left M2. Although nonspecific this constellation of clinical and imaging findings might indicate accelerated branch vessel atherosclerosis. Although other large vessels appear normal, with no atherosclerosis in the neck or at the aortic arch.  MR ANGIO HEAD WO CONTRAST 03/27/2020 1. Moderate-sized acute right MCA infarct. 2. Small subacute left parieto-occipital infarcts. 3. Right M3 branch occlusion, otherwise negative head [id number]  CT ABDOMEN PELVIS W CONTRAST 03/27/2020 1. New 18 mm hypoattenuating lesion upper pole right kidney with areas of subcapsular multifocal decreased enhancement in the lower pole right kidney. Imaging features are nonspecific and could be related to pyelonephritis and phlegmon/evolving abscess in the upper pole right kidney cannot be excluded. Alternatively, multifocal right renal infarct could have this appearance. Correlation with urinalysis may prove helpful.  2. Relatively well-defined 2.2 cm lesion in the inferior lingula measures water density. This may be loculated pleural fluid or intraparenchymal fluid collection in this patient with a history of pulmonary embolus and bilateral ground-glass airspace disease on the previous study suspicious for multifocal atypical/viral pneumonia. Consider follow-up to ensure resolution.  Cardiac  Studies:   ECHOCARDIOGRAM COMPLETE BUBBLE STUDY 03/27/2020 1. Left ventricular ejection fraction, by estimation, is 20 to 25%. The left ventricle has severely decreased function. There is severe hypokinesis of the basal-to-apical anteroseptum, inferoseptum and anterior LV walls . Left ventricular diastolic  parameters are consistent with Grade II diastolic dysfunction (pseudonormalization). 2. Right ventricular systolic function is normal. The right ventricular size is normal. 3. Left atrial size was moderately dilated. 4. The mitral valve is normal in structure. Trivial mitral valve regurgitation. 5. The aortic valve is tricuspid. There is mild thickening of the aortic valve. Aortic valve regurgitation is not visualized. 6. The inferior vena cava is normal in size with greater than 50% respiratory variability, suggesting right atrial pressure of 3 mmHg. 7. Agitated saline contrast bubble study was negative, with no evidence of any interatrial shunt.  Comparison(s): Compared to prior echo in 06/2019, the LVEF is now severely reduced to 20-25% with severe hypokinesis of the inferoseptal, anteroseptal and anterior LV walls.  EKG:   EKG 04/18/2020: Sinus rhythm at a rate of 90 bpm. Left axis deviation.  PRWP, cannot exclude anteroseptal infarct old. Non-specific T wave abnormality.   EKG 03/27/2020: Sinus tachycardia at rate of 102 bpm, normal axis, borderline criteria for LVH. Borderline QT elongation. Compared to 06/30/2019, sinus tachycardia new. No change in QT elongation.  Assessment     ICD-10-CM   1. Dilated cardiomyopathy (HCC)  I42.0 EKG 12-Lead    carvedilol (COREG) 3.125 MG tablet    Basic metabolic panel    Pro b natriuretic peptide  2. Chronic systolic heart failure (HCC)  I50.22 carvedilol (COREG) 3.125 MG tablet    Basic metabolic panel    Pro b natriuretic peptide  3. Essential hypertension  I10   4. History of CVA with residual deficit  I69.30 EKG 12-Lead  5.  History of pulmonary embolus (PE)  Z86.711   6. History of COVID-19  Z86.16   7. Hypercholesteremia  E78.00   8. Sleep apnea with use of continuous positive airway pressure (CPAP)  G47.30   9. Type 2 diabetes mellitus without complication, with long-term current use of insulin (HCC)  E11.9    Z79.4      Medications Discontinued During This Encounter  Medication Reason  . carvedilol (COREG) 3.125 MG tablet Reorder    Meds ordered this encounter  Medications  . carvedilol (COREG) 3.125 MG tablet    Sig: Take 2 tablets (6.25 mg total) by mouth 2 (two) times daily with a meal.    Dispense:  60 tablet    Refill:  1    Recommendations:   Shelly Shields is a 39 y.o. African-American female with hypertension, hyperlipidemia, uncontrolled diabetes mellitus, obesity and obstructive sleep apnea on CPAP and history of Covid pneumonia in January 2021 and also had pulmonary embolism during Covid infection.  She is also being evaluated for vasculitis. 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 Cardiomyopathy etiology likely stress cardiomyopathy vs significant coronary artery disease vs small PFO noted on TEE vs atrial fibrillation.   Patient presents for follow up from the hospital for new onset dilated cardiomyopathy with severe LV dysfunction. Patient is presently feeling well and without clinical signs of acute decompensated heart failure. Discussed at length patient's complex medical conditions and education patient regarding risks and precautions of anticoagulation use as well as pathophysiology of dilated cardiomyopathy and heart failure. Patient's heart rate >70 bpm, will increase carvedilol from 3.125 mg to 6.25 mg twice daily. Will repeat BMP and BNP prior to next visit. Patient will need ischemic evaluation, however at this time will first titrate heart failure medications and allow her time to  recover from hospitalization. She will also need outpatient cardiac monitoring with Zio patch to evaluate for atrial fibrillation, will do this at follow up visit. Patient's hypercoagulabilty workup is negative, however will continue Eliquis due to recurrent thromboembolic events.   Follow up in 2 weeks for heart failure medication titration and dilated cardiomyopathy.   Patient was seen in collaboration with Dr. Einar Gip. He also reviewed patient's chart and Dr. Einar Gip is in agreement of the plan.   During this visit I reviewed and updated: Tobacco history  allergies medication reconciliation  medical history  surgical history  family history  social history.  This note was created using a voice recognition software as a result there may be grammatical errors inadvertently enclosed that do not reflect the nature of this encounter. Every attempt is made to correct such errors.   Alethia Berthold, PA-C  04/18/2020, 6:18 PM Office: [phone]

## 2020-04-19 ENCOUNTER — Ambulatory Visit: Payer: 59

## 2020-04-19 DIAGNOSIS — M6281 Muscle weakness (generalized): Secondary | ICD-10-CM

## 2020-04-19 DIAGNOSIS — I69354 Hemiplegia and hemiparesis following cerebral infarction affecting left non-dominant side: Secondary | ICD-10-CM | POA: Diagnosis not present

## 2020-04-19 DIAGNOSIS — R2681 Unsteadiness on feet: Secondary | ICD-10-CM

## 2020-04-19 DIAGNOSIS — R2689 Other abnormalities of gait and mobility: Secondary | ICD-10-CM

## 2020-04-19 NOTE — Patient Instructions (Signed)
Access Code: [email or web address] URL: [email or web address] Date: 04/19/2020 Prepared by: Jethro Bastos  Exercises Sit to Stand without Arm Support - 1 x daily - 5 x weekly - 2 sets - 10 reps Standing Balance with Eyes Closed on Pillow - 1 x daily - 5 x weekly - 1 sets - 3 reps - 30 hold Standing with Head Rotation on Pillow - 1 x daily - 5 x weekly - 2 sets - 10 reps Standing with Head Nod on Pillow - 1 x daily - 5 x weekly - 2 sets - 10 reps

## 2020-04-19 NOTE — Therapy (Signed)
[facility] [address] [place], [place], [address] Phone: [phone]   Fax:  [phone]  Physical Therapy Treatment  Patient Details  Name: Shelly Shields MRN: [id number] Date of Birth: [birth year] Referring Provider (PT): Huey Bienenstock referred (hospitalist) but PCP Arlan Organ   Encounter Date: 04/19/2020   PT End of Session - 04/19/20 1403    Visit Number 2    Number of Visits 9    Authorization Type [employer]    PT Start Time 1401    PT Stop Time 1443    PT Time Calculation (min) 42 min    Equipment Utilized During Treatment Gait belt    Activity Tolerance Patient tolerated treatment well    Behavior During Therapy [facility] for tasks assessed/performed;Flat affect           Past Medical History:  Diagnosis Date  . Asthma   . COVID-19 long hauler   . Diabetes mellitus without complication (HCC)   . Hypertension   . Migraines   . Obesity   . Pulmonary embolism (HCC) 05/2019   with COVID  . Sleep apnea     Past Surgical History:  Procedure Laterality Date  . BILATERAL CARPAL TUNNEL RELEASE    . TEE WITHOUT CARDIOVERSION N/A 03/28/2020   Procedure: TRANSESOPHAGEAL ECHOCARDIOGRAM (TEE);  Surgeon: Yates Decamp, MD;  Location: [facility] ENDOSCOPY;  Service: Cardiovascular;  Laterality: N/A;    There were no vitals filed for this visit.   Subjective Assessment - 04/19/20 1404    Subjective Patietn reports no new changes/complaints since last visit. No pain to report. No falls.    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 PMH HTN HLD DM2 obese OSA on cpap COVID with pulmonary embolism vaculitis    Limitations Lifting;House hold activities    Currently in Pain? No/denies    Pain Onset In the past 7 days              [facility] PT Assessment - 04/19/20 1405      6 Minute Walk-  Baseline   6 Minute Walk- Baseline yes    BP (mmHg) 100/78    HR (bpm) 87    02 Sat (%RA) 99 %    Modified Borg Scale for Dyspnea 1- Very mild shortness of breath      6 Minute walk- Post Test   6 Minute Walk Post Test yes    BP (mmHg) 124/78    HR (bpm) 95    02 Sat (%RA) 99 %    Modified Borg Scale for Dyspnea 7- Severe shortness of breath or very hard breathing    Perceived Rate of Exertion (Borg) 16-      6 minute walk test results    Aerobic Endurance Distance Walked 847    Endurance additional comments patient reports fatigue in lower extremity, no standing rest breaks required.             Completed all of the following exercises as established initial HEP focused on strengthening/balance. PT educating on completion of balance exercises in a corner for improved safety. Patient verbalizing understanding.     Access Code: [email or web address] URL: [email or web address] Date: 04/19/2020 Prepared by: Jethro Bastos  Exercises Sit to Stand without Arm Support - 1 x daily - 5 x weekly - 2 sets - 10 reps Standing Balance with Eyes Closed on Pillow -  1 x daily - 5 x weekly - 1 sets - 3 reps - 30 hold Standing with Head Rotation on Pillow - 1 x daily - 5 x weekly - 2 sets - 10 reps Standing with Head Nod on Pillow - 1 x daily - 5 x weekly - 2 sets - 10 reps    PT also educating on beginning walking program starting with 5 minutes/per day, and gradually increase by 1-2 minutes every 4-5 days to promote improved endurance.     PT Education - 04/19/20 1435    Education Details Educated on results; Initial HEP; Walking Program (5 minutes/per day, will gradually progress)    Person(s) Educated Patient;Parent(s)    Methods Explanation;Demonstration;Handout    Comprehension Verbalized understanding;Returned demonstration            PT Short Term Goals - 04/11/20 1821      PT SHORT TERM GOAL #1   Title Pt will be independent with initial HEP.    Baseline NA     Time 4    Period Weeks    Status New    Target Date 05/09/20      PT SHORT TERM GOAL #2   Title Pt will increase FGA to >/= 25/30 for improved functional balance.    Baseline 21/30 04/11/20    Time 4    Period Weeks    Status New    Target Date 05/09/20      PT SHORT TERM GOAL #3   Title Pt will improve gait speed to 0.7 m/s for improved access to community.    Baseline 0.6 m/s 04/11/20    Time 4    Period Weeks    Status New    Target Date 05/09/20      PT SHORT TERM GOAL #4   Title Pt will be able to ambulate 500+ feet with <4 RPE with no AD and supervsion.    Baseline NA    Time 4    Period Weeks    Status New    Target Date 05/09/20             PT Long Term Goals - 04/19/20 1553      PT LONG TERM GOAL #1   Title Pt will be independent with progressive HEP.    Baseline NA    Time 8    Period Weeks    Status New      PT LONG TERM GOAL #2   Title Pt will increase FOTO from 68 to >/= 80.    Baseline 68 04/11/20    Time 8    Period Weeks    Status New      PT LONG TERM GOAL #3   Title Pt will be able to ambulate 1,000+ feet with <4 RPE with no AD and supervsion.    Baseline NT    Time 8    Period Weeks    Status New      PT LONG TERM GOAL #4   Title Patient will be able to complete >/= 1,000 ft with test to demonstrate improved endurance.    Baseline 846    Time 8    Period Weeks    Status Revised      PT LONG TERM GOAL #5   Title Pt will score >27 on FGA for improved dynamic balance.    Baseline 21/30 04/11/20    Time 8    Period Weeks    Status New  Plan - 04/19/20 1448    Clinical Impression Statement Today's skilled PT session included further assessment of endurance with completion of , patient able to ambulate x 847 ft today. Patient did have increased SOB and lower extremity fatigue at end of test. Rest of session spent establishing initial HEP focused on strengthening/balance as tolerated by patient.  Patient will continue to benefit from skilled PT services to progress toward all goals.    Personal Factors and Comorbidities Past/Current Experience;Comorbidity 3+    Comorbidities PMH: HTN HLD DM2 obese OSA on cpap COVID with pulmonary embolism vaculitis    Examination-Activity Limitations Squat;Stairs;Locomotion Level;Lift    Examination-Participation Restrictions Occupation;Community Activity    Stability/Clinical Decision Making Evolving/Moderate complexity    Rehab Potential Good    PT Frequency 1x / week   plus eval   PT Duration 8 weeks    PT Treatment/Interventions Gait training;Stair training;Functional mobility training;Therapeutic activities;Therapeutic exercise;Balance training;Neuromuscular re-education;Patient/family education;ADLs/Self Care Home Management;Visual/perceptual remediation/compensation;Energy conservation;Vestibular;Manual techniques    PT Next Visit Plan How was HEP? Update if Needed. Continue high level balance/strengthening    Consulted and Agree with Plan of Care Patient           Patient will benefit from skilled therapeutic intervention in order to improve the following deficits and impairments:  Abnormal gait, Obesity, Decreased strength, Decreased balance, Improper body mechanics, Impaired vision/preception, Impaired perceived functional ability, Difficulty walking, Decreased coordination, Cardiopulmonary status limiting activity, Decreased activity tolerance, Decreased endurance  Visit Diagnosis: Muscle weakness (generalized)  Hemiplegia and hemiparesis following cerebral infarction affecting left non-dominant side (HCC)  Unsteadiness on feet  Other abnormalities of gait and mobility     Problem List Patient Active Problem List   Diagnosis Date Noted  . Acute CVA (cerebrovascular accident) (HCC) 03/27/2020  . Sleep apnea   . Diabetes mellitus without complication (HCC)   . COVID-19 long hauler   . Obesity   . Acute pulmonary embolism (HCC)  06/30/2019  . Pulmonary embolism (HCC) 05/2019  . Hypernatremia   . Asthma, chronic   . DKA (diabetic ketoacidoses) 11/27/2014  . Diabetes mellitus, new onset (HCC)   . Hyperglycemic hyperosmolar nonketotic coma (HCC)   . Essential hypertension     Tempie Donning, PT, DPT 04/19/2020, 3:55 PM  [facility] [address] [place], [place], [address] Phone: [phone]   Fax:  [phone]  Name: Shelly Shields MRN: [id number] Date of Birth: March 04, 1981

## 2020-04-25 ENCOUNTER — Ambulatory Visit: Payer: 59 | Admitting: Occupational Therapy

## 2020-04-25 ENCOUNTER — Other Ambulatory Visit: Payer: Self-pay

## 2020-04-25 ENCOUNTER — Encounter: Payer: Self-pay | Admitting: Occupational Therapy

## 2020-04-25 DIAGNOSIS — R2681 Unsteadiness on feet: Secondary | ICD-10-CM

## 2020-04-25 DIAGNOSIS — I69354 Hemiplegia and hemiparesis following cerebral infarction affecting left non-dominant side: Secondary | ICD-10-CM

## 2020-04-25 DIAGNOSIS — R41842 Visuospatial deficit: Secondary | ICD-10-CM

## 2020-04-25 DIAGNOSIS — R278 Other lack of coordination: Secondary | ICD-10-CM

## 2020-04-25 DIAGNOSIS — R2689 Other abnormalities of gait and mobility: Secondary | ICD-10-CM

## 2020-04-25 DIAGNOSIS — M6281 Muscle weakness (generalized): Secondary | ICD-10-CM

## 2020-04-25 NOTE — Therapy (Signed)
[facility] [address] [place], [place], [address] Phone: [phone]   Fax:  [phone]  Occupational Therapy Treatment  Patient Details  Name: Shelly Shields MRN: [id number] Date of Birth: [birth year] Referring Provider (OT): [facility] - referred - will send reports to PCP per patient's request - Dr Arlan Organ   Encounter Date: 04/25/2020   OT End of Session - 04/25/20 1439    Visit Number 3    Number of Visits 17    Date for OT Re-Evaluation 06/25/20    Authorization Type 60 combined visits OT/PT/SLP [facility])    OT Start Time 1451    OT Stop Time 1530    OT Time Calculation (min) 39 min    Activity Tolerance Patient tolerated treatment well    Behavior During Therapy [facility] for tasks assessed/performed;Flat affect           Past Medical History:  Diagnosis Date  . Asthma   . COVID-19 long hauler   . Diabetes mellitus without complication (HCC)   . Hypertension   . Migraines   . Obesity   . Pulmonary embolism (HCC) 05/2019   with COVID  . Sleep apnea     Past Surgical History:  Procedure Laterality Date  . BILATERAL CARPAL TUNNEL RELEASE    . TEE WITHOUT CARDIOVERSION N/A 03/28/2020   Procedure: TRANSESOPHAGEAL ECHOCARDIOGRAM (TEE);  Surgeon: Yates Decamp, MD;  Location: [facility] ENDOSCOPY;  Service: Cardiovascular;  Laterality: N/A;    There were no vitals filed for this visit.   Subjective Assessment - 04/25/20 1524    Subjective  Pt reports mild headache    Pertinent History Covid, Migraines, DM, Sleep apnea    Currently in Pain? Yes    Pain Score 2     Pain Location Head    Pain Type Chronic pain    Pain Onset More than a month ago    Pain Frequency Intermittent    Aggravating Factors  unknown    Pain Relieving Factors tylenol                     Treatment: Environmental scanning to locate items in sequential order 80% for first pass. Therapist reviewed vision compensations  verbally, handout would not print, plan to issue next visit. Pipe tree design x 2, min v.c to organize pieces, then pt was able to correctly complete design. Typing test 13 wpm, 46% accuracy Typing activities for speed and accuracy, bubble and word tris, min difficulty.             OT Short Term Goals - 04/11/20 1854      OT SHORT TERM GOAL #1   Title Patient will complete HEP designed to improve coordiantion in non dominant LUE    Time 4    Period Weeks    Status New    Target Date 05/26/20      OT SHORT TERM GOAL #2   Title Patient will type three full sentences using two hands on keyboard in less than 5 minutes    Time 4    Period Weeks    Status New      OT SHORT TERM GOAL #3   Title Patient will navigate through empty hallway in familiar env't without hitting any obstacles placed in left field    Time 4    Period Weeks    Status New      OT SHORT TERM GOAL #4   Title Patient  will prepare a simple, familiar hot dish using stove top or oven with minimal cueing    Time 4    Period Weeks    Status New      OT SHORT TERM GOAL #5   Title Patient will demonstrate 3lb increase in grip strength in BUE's    Time 4    Period Weeks    Status New             OT Long Term Goals - 04/11/20 1900      OT LONG TERM GOAL #1   Title Patient will complete an updated HEP to address overall upper body strength and conditioning    Time 8    Period Weeks    Status New    Target Date 06/25/20      OT LONG TERM GOAL #2   Title Patient will demonstrate a 5 lb increase in BUE strength to aide in opening packages and carrying items    Time 8    Period Weeks    Status New      OT LONG TERM GOAL #3   Title Patient will demonstrate a 5 sec reduction in time on 9 hole peg test to aide in coordiantion 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 She demonstrates imrpoving environmental scanning.    OT Occupational Profile and History Detailed Assessment- Review of Records and additional review of physical, cognitive, psychosocial history related to current functional performance    Occupational performance deficits (Please refer to evaluation for details): ADL's;IADL's;Work;Leisure    Body Structure / Function / Physical Skills ADL;Coordination;Endurance;UE functional use;Balance;Decreased knowledge of use of DME;Flexibility;IADL;Vision;Dexterity;FMC;Strength    OT Frequency 2x / week    OT Duration 8 weeks    OT Treatment/Interventions Self-care/ADL training;Therapeutic exercise;Visual/perceptual remediation/compensation;Patient/family education;Neuromuscular education;Therapeutic activities;Balance training;DME and/or AE instruction;Cognitive remediation/compensation;Energy conservation;Functional Mobility Training    Plan issue visual compensation strategies handout, consider vision HEP, address coordination and grip strength    Consulted and Agree with Plan of Care Patient           Patient will benefit from skilled therapeutic intervention in order to improve the following deficits and impairments:   Body Structure / Function / Physical Skills: ADL, Coordination, Endurance, UE functional use, Balance, Decreased knowledge of use of DME, Flexibility, IADL, Vision, Dexterity, FMC, Strength       Visit Diagnosis: Muscle weakness (generalized)  Hemiplegia and hemiparesis following cerebral infarction affecting left non-dominant side (HCC)  Unsteadiness on  feet  Other abnormalities of gait and mobility  Visuospatial deficit  Other lack of coordination    Problem List Patient Active Problem List   Diagnosis Date Noted  . Acute CVA (cerebrovascular accident) (HCC) 03/27/2020  . Sleep apnea   . Diabetes mellitus without complication (  HCC)   . COVID-19 long hauler   . Obesity   . Acute pulmonary embolism (HCC) 06/30/2019  . Pulmonary embolism (HCC) 05/2019  . Hypernatremia   . Asthma, chronic   . DKA (diabetic ketoacidoses) 11/27/2014  . Diabetes mellitus, new onset (HCC)   . Hyperglycemic hyperosmolar nonketotic coma (HCC)   . Essential hypertension     , 04/25/2020, 3:27 PM Keene Breath, OTR/L [phone] Phone: [phone] 3:50 PM 04/25/20 [facility] [address] [place], [place], [address] Phone: [phone]   Fax:  [phone]  Name: Shelly Shields MRN: [id number] Date of Birth: December 09, 1980

## 2020-04-25 NOTE — Patient Instructions (Signed)
1. Look for the edge of objects (to the left and/or right) so that you make sure you are seeing all of an object 2. Turn your head when walking, scan from side to side, particularly in busy environments 3. Use an organized scanning pattern. It's usually easier to scan from top to bottom, and left to right (like you are reading) 4. Double check yourself 5. Use a line guide (like a blank piece of paper) or your finger when reading 6. If necessary, place brightly colored tape or mark as a reminder to always look until you see the tape.

## 2020-04-27 ENCOUNTER — Ambulatory Visit: Payer: 59 | Attending: Internal Medicine

## 2020-04-27 ENCOUNTER — Ambulatory Visit: Payer: 59 | Admitting: Occupational Therapy

## 2020-04-27 ENCOUNTER — Other Ambulatory Visit: Payer: Self-pay

## 2020-04-27 VITALS — BP 116/84 | HR 73

## 2020-04-27 DIAGNOSIS — R41842 Visuospatial deficit: Secondary | ICD-10-CM

## 2020-04-27 DIAGNOSIS — R2689 Other abnormalities of gait and mobility: Secondary | ICD-10-CM

## 2020-04-27 DIAGNOSIS — M6281 Muscle weakness (generalized): Secondary | ICD-10-CM | POA: Diagnosis not present

## 2020-04-27 DIAGNOSIS — R471 Dysarthria and anarthria: Secondary | ICD-10-CM | POA: Diagnosis present

## 2020-04-27 DIAGNOSIS — I69354 Hemiplegia and hemiparesis following cerebral infarction affecting left non-dominant side: Secondary | ICD-10-CM

## 2020-04-27 DIAGNOSIS — R2681 Unsteadiness on feet: Secondary | ICD-10-CM | POA: Diagnosis present

## 2020-04-27 DIAGNOSIS — R4701 Aphasia: Secondary | ICD-10-CM | POA: Diagnosis present

## 2020-04-27 DIAGNOSIS — R278 Other lack of coordination: Secondary | ICD-10-CM

## 2020-04-27 DIAGNOSIS — R41841 Cognitive communication deficit: Secondary | ICD-10-CM | POA: Diagnosis present

## 2020-04-27 NOTE — Patient Instructions (Addendum)
1. Look for the edge of objects (to the left and/or right) so that you make sure you are seeing all of an object 2. Turn your head when walking, scan from side to side, particularly in busy environments 3. Use an organized scanning pattern. It's usually easier to scan from top to bottom, and left to right (like you are reading) 4. Double check yourself 5. Use a line guide (like a blank piece of paper) or your finger when reading 6. If necessary, place brightly colored tape at end of table or work area as a reminder to always look until you see the tape.             1. Grip Strengthening (Resistive Putty)   Squeeze putty using thumb and all fingers. Repeat _20___ times. Do __2__ sessions per day.   2. Roll putty into tube on table and pinch between each finger and thumb x 10 reps each. (can do ring and small finger together)     Copyright  [employer]. All rights reserved.

## 2020-04-27 NOTE — Therapy (Signed)
[facility] [address] [place], [place], [address] Phone: [phone]   Fax:  [phone]  Physical Therapy Treatment  Patient Details  Name: Shelly Shields MRN: [id number] Date of Birth: Apr 09, 1981 Referring Provider (PT): Huey Bienenstock referred (hospitalist) but PCP Arlan Organ   Encounter Date: 04/27/2020   PT End of Session - 04/27/20 1403    Visit Number 3    Number of Visits 9    Authorization Type [employer]    PT Start Time 1401    PT Stop Time 1444    PT Time Calculation (min) 43 min    Equipment Utilized During Treatment Gait belt    Activity Tolerance Patient tolerated treatment well    Behavior During Therapy [facility] for tasks assessed/performed;Flat affect           Past Medical History:  Diagnosis Date  . Asthma   . COVID-19 long hauler   . Diabetes mellitus without complication (HCC)   . Hypertension   . Migraines   . Obesity   . Pulmonary embolism (HCC) 05/2019   with COVID  . Sleep apnea     Past Surgical History:  Procedure Laterality Date  . BILATERAL CARPAL TUNNEL RELEASE    . TEE WITHOUT CARDIOVERSION N/A 03/28/2020   Procedure: TRANSESOPHAGEAL ECHOCARDIOGRAM (TEE);  Surgeon: Yates Decamp, MD;  [facility] ENDOSCOPY;  Service: Cardiovascular;  Laterality: N/A;    Vitals:   04/27/20 1408  BP: 116/84  Pulse: 73     Subjective Assessment - 04/27/20 1404    Subjective Patient reports no new changes. No pain or falls. Patient reports HEP went well.    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 PMH HTN HLD DM2 obese OSA on cpap COVID with pulmonary embolism vaculitis    Limitations Lifting;House hold activities    Currently in Pain? No/denies    Pain Onset In the past 7 days              [facility] Adult PT Treatment/Exercise - 04/27/20 0001       Transfers   Transfers Sit to Stand;Stand to Sit    Sit to Stand 5: Supervision    Stand to Sit 5: Supervision    Number of Reps 10 reps    Comments completed sit <> stand with RLE positioned on 2" step to promote improved weight shift to LLE and improved strengthening. completed 2 sets x 10 reps. verbal cues for control.       Ambulation/Gait   Ambulation/Gait Yes    Ambulation/Gait Assistance 5: Supervision    Ambulation/Gait Assistance Details throughout therapy session, and in/out of therapy gym    Assistive device None    Gait Pattern Step-through pattern;Narrow base of support;Scissoring    Ambulation Surface Level;Indoor      Exercises   Exercises Knee/Hip      Knee/Hip Exercises: Aerobic   Other Aerobic Completed SciFit on Level 2.5 with BUE/BLE x 5 minutes for improved strengthening/endurance training.       Knee/Hip Exercises: Standing   Heel Raises Both;1 set;10 reps;3 seconds;Limitations    Heel Raises Limitations compelted with BUE support    Hip Abduction Stengthening;Both;1 set;10 reps;Knee straight    Abduction Limitations completed x 10 reps on BLE, PT providing verbal cues for posture with stnace on LLE. light UE support during completion.  Lateral Step Up Left;1 set;10 reps;Hand Hold: 0;Step Height: 6";Limitations    Lateral Step Up Limitations completed in // bars, no UE support    Forward Step Up Both;1 set;10 reps;Hand Hold: 0;Step Height: 6";Limitations    Forward Step Up Limitations compelted in // bars, no UE support. verbal cues for posture and standing tall on LLE when leading with LLE    Other Standing Knee Exercises Completed toe raises in standing light BUE support from // bars, completed x 10 reps. verbal cues for technique/form.              Balance Exercises - 04/27/20 0001      Balance Exercises: Standing   Standing Eyes Opened Narrow base of support (BOS);Head turns;Foam/compliant surface;Limitations    Standing Eyes Opened Limitations  completed horiozntal/verticla head turns 2 x10 reps of each, incrased challenge with horizontal > Vertical .     Standing Eyes Closed Narrow base of support (BOS);Foam/compliant surface;3 reps;30 secs;Limitations    Standing Eyes Closed Limitations on airex, narrow BOS. increased challenge with vision removed    Tandem Stance Intermittent upper extremity support;2 reps;30 secs;Eyes closed;Limitations    Tandem Stance Time completed 2 x 30 seconds bilaterally with vision removed. intermittent UE support required.     Marching Solid surface;Intermittent upper extremity assist;Forwards;5 reps;Limitations    Marching Limitations completed forward marching x 5 laps without UE support.                PT Short Term Goals - 04/11/20 1821      PT SHORT TERM GOAL #1   Title Pt will be independent with initial HEP.    Baseline NA    Time 4    Period Weeks    Status New    Target Date 05/09/20      PT SHORT TERM GOAL #2   Title Pt will increase FGA to >/= 25/30 for improved functional balance.    Baseline 21/30 04/11/20    Time 4    Period Weeks    Status New    Target Date 05/09/20      PT SHORT TERM GOAL #3   Title Pt will improve gait speed to 0.7 m/s for improved access to community.    Baseline 0.6 m/s 04/11/20    Time 4    Period Weeks    Status New    Target Date 05/09/20      PT SHORT TERM GOAL #4   Title Pt will be able to ambulate 500+ feet with <4 RPE with no AD and supervsion.    Baseline NA    Time 4    Period Weeks    Status New    Target Date 05/09/20             PT Long Term Goals - 04/19/20 1553      PT LONG TERM GOAL #1   Title Pt will be independent with progressive HEP.    Baseline NA    Time 8    Period Weeks    Status New      PT LONG TERM GOAL #2   Title Pt will increase FOTO from 68 to >/= 80.    Baseline 68 04/11/20    Time 8    Period Weeks    Status New      PT LONG TERM GOAL #3   Title Pt will be able to ambulate 1,000+ feet with  <4 RPE with no AD and supervsion.  Baseline NT    Time 8    Period Weeks    Status New      PT LONG TERM GOAL #4   Title Patient will be able to complete >/= 1,000 ft with test to demonstrate improved endurance.    Baseline 846    Time 8    Period Weeks    Status Revised      PT LONG TERM GOAL #5   Title Pt will score >27 on FGA for improved dynamic balance.    Baseline 21/30 04/11/20    Time 8    Period Weeks    Status New                 Plan - 04/27/20 1438    Clinical Impression Statement Today's session focused on continued strengthening/balance exercises as tolerated by patient. Intermittent rest breaks required throughout session due to fatigue, overall patient doing well throughout session. Will continue per POC, and progress toward all goals.    Personal Factors and Comorbidities Past/Current Experience;Comorbidity 3+    Comorbidities PMH: HTN HLD DM2 obese OSA on cpap COVID with pulmonary embolism vaculitis    Examination-Activity Limitations Squat;Stairs;Locomotion Level;Lift    Examination-Participation Restrictions Occupation;Community Activity    Stability/Clinical Decision Making Evolving/Moderate complexity    Rehab Potential Good    PT Frequency 1x / week   plus eval   PT Duration 8 weeks    PT Treatment/Interventions Gait training;Stair training;Functional mobility training;Therapeutic activities;Therapeutic exercise;Balance training;Neuromuscular re-education;Patient/family education;ADLs/Self Care Home Management;Visual/perceptual remediation/compensation;Energy conservation;Vestibular;Manual techniques    PT Next Visit Plan Continue high level balance/strengthening. Scifit/Nustep for endurance. Continue to progress HEP as able.    Consulted and Agree with Plan of Care Patient           Patient will benefit from skilled therapeutic intervention in order to improve the following deficits and impairments:  Abnormal gait, Obesity, Decreased  strength, Decreased balance, Improper body mechanics, Impaired vision/preception, Impaired perceived functional ability, Difficulty walking, Decreased coordination, Cardiopulmonary status limiting activity, Decreased activity tolerance, Decreased endurance  Visit Diagnosis: Muscle weakness (generalized)  Hemiplegia and hemiparesis following cerebral infarction affecting left non-dominant side (HCC)  Unsteadiness on feet  Other abnormalities of gait and mobility     Problem List Patient Active Problem List   Diagnosis Date Noted  . Acute CVA (cerebrovascular accident) (HCC) 03/27/2020  . Sleep apnea   . Diabetes mellitus without complication (HCC)   . COVID-19 long hauler   . Obesity   . Acute pulmonary embolism (HCC) 06/30/2019  . Pulmonary embolism (HCC) 05/2019  . Hypernatremia   . Asthma, chronic   . DKA (diabetic ketoacidoses) 11/27/2014  . Diabetes mellitus, new onset (HCC)   . Hyperglycemic hyperosmolar nonketotic coma (HCC)   . Essential hypertension     Tempie Donning, PT, DPT 04/27/2020, 2:43 PM  [facility] [address] [place], [place], [address] Phone: [phone]   Fax:  [phone]  Name: Shelly Shields MRN: [id number] Date of Birth: 1981-01-06

## 2020-04-27 NOTE — Therapy (Signed)
[facility] [address] [place], [name], [address] Phone: [phone]   Fax:  [phone]  Occupational Therapy Treatment  Patient Details  Name: Shelly Shields MRN: [id number] Date of Birth: [birth year] Referring Provider (OT): [facility] - referred - will send reports to PCP per patient's request - Dr Salvadore Oxford   Encounter Date: 04/27/2020   OT End of Session - 04/27/20 1334    Visit Number 4    Number of Visits 17    Date for OT Re-Evaluation 06/25/20    Authorization Type 60 combined visits OT/PT/SLP [facility])    OT Start Time 1320    OT Stop Time 1400    OT Time Calculation (min) 40 min    Activity Tolerance Patient tolerated treatment well    Behavior During Therapy [facility] for tasks assessed/performed;Flat affect           Past Medical History:  Diagnosis Date  . Asthma   . COVID-19 long hauler   . Diabetes mellitus without complication ([place])   . Hypertension   . Migraines   . Obesity   . Pulmonary embolism ([place]) 05/2019   with COVID  . Sleep apnea     Past Surgical History:  Procedure Laterality Date  . BILATERAL CARPAL TUNNEL RELEASE    . TEE WITHOUT CARDIOVERSION N/A 03/28/2020   Procedure: TRANSESOPHAGEAL ECHOCARDIOGRAM (TEE);  Surgeon: Adrian Prows, MD;  Location: [place];  Service: Cardiovascular;  Laterality: N/A;    There were no vitals filed for this visit.        Treatment: Simple cooking task with supervision to scramble and egg, pt located items and performed safely. She turned off the stove without prompting. Gripper level 1 for sustained grip 1/2 blocks with RUE and the other half with LUE, min difficulty/ drops. Grooved pegboard with LUE and RUE for increased fine motor coordination and in hand manipulation. Min difficulty, drops. Arm bike x 5 mins level 3 for conditioning                  OT Education - 04/27/20 1324    Education Details reveiwed and  issued vision compensation strategies.    Person(s) Educated Patient    Methods Explanation;Demonstration;Handout    Comprehension Verbalized understanding;Returned demonstration;Verbal cues required            OT Short Term Goals - 04/27/20 1339      OT SHORT TERM GOAL #1   Title Patient will complete HEP designed to improve coordiantion in non dominant LUE    Time 4    Period Weeks    Status On-going    Target Date 05/26/20      OT SHORT TERM GOAL #2   Title Patient will type three full sentences using two hands on keyboard in less than 5 minutes    Time 4    Period Weeks    Status On-going      OT SHORT TERM GOAL #3   Title Patient will navigate through empty hallway in familiar env't without hitting any obstacles placed in left field    Time 4    Period Weeks    Status On-going      OT SHORT TERM GOAL #4   Title Patient will prepare a simple, familiar hot dish using stove top or oven with minimal cueing    Time 4    Period Weeks    Status Achieved   met pt performed with supervision  OT SHORT TERM GOAL #5   Title Patient will demonstrate 3lb increase in grip strength in BUE's    Time 4    Period Weeks    Status On-going             OT Long Term Goals - 04/11/20 1900      OT LONG TERM GOAL #1   Title Patient will complete an updated HEP to address overall upper body strength and conditioning    Time 8    Period Weeks    Status New    Target Date 06/25/20      OT LONG TERM GOAL #2   Title Patient will demonstrate a 5 lb increase in BUE strength to aide in opening packages and carrying items    Time 8    Period Weeks    Status New      OT LONG TERM GOAL #3   Title Patient will demonstrate a 5 sec reduction in time on 9 hole peg test to aide in 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    OT Occupational Profile and History Detailed Assessment- Review of Records and additional review of physical, cognitive, psychosocial history related to current functional performance    Occupational performance deficits (Please refer to evaluation for details): ADL's;IADL's;Work;Leisure    Body Structure / Function / Physical Skills ADL;Coordination;Endurance;UE functional use;Balance;Decreased knowledge of use of DME;Flexibility;IADL;Vision;Dexterity;FMC;Strength    OT Frequency 2x / week    OT Duration 8 weeks    OT Treatment/Interventions Self-care/ADL training;Therapeutic exercise;Visual/perceptual remediation/compensation;Patient/family education;Neuromuscular education;Therapeutic activities;Balance training;DME and/or AE instruction;Cognitive remediation/compensation;Energy conservation;Functional Mobility Training    Plan check on putty HEP, continue to address envisonmental scanning, add a cognitive component    Consulted and Agree with Plan of Care Patient           Patient will benefit from skilled therapeutic intervention in order to improve the following deficits and impairments:   Body Structure / Function / Physical Skills: ADL, Coordination, Endurance, UE functional use, Balance, Decreased knowledge of use of DME, Flexibility, IADL, Vision, Dexterity, FMC, Strength       Visit Diagnosis: Muscle weakness (generalized)  Hemiplegia and hemiparesis following cerebral infarction affecting left non-dominant side (HCC)  Other lack of coordination  Visuospatial deficit    Problem  List Patient Active Problem List   Diagnosis Date Noted  . Acute CVA (cerebrovascular accident) ([place]) 03/27/2020  . Sleep apnea   . Diabetes mellitus without complication ([place])   . COVID-19 long hauler   . Obesity   . Acute pulmonary embolism ([place]) 06/30/2019  . Pulmonary embolism ([place]) 05/2019  . Hypernatremia   . Asthma, chronic   . DKA (diabetic ketoacidoses) 11/27/2014  . Diabetes mellitus, new onset (  [place])   . Hyperglycemic hyperosmolar nonketotic coma ([place])   . Essential hypertension     , 04/27/2020, 1:49 PM  [facility] [address] [place], [name], [address] Phone: [phone]   Fax:  [phone]  Name: ELIS RAWLINSON MRN: [id number] Date of Birth: March 02, 1981

## 2020-05-02 ENCOUNTER — Encounter: Payer: Self-pay | Admitting: Occupational Therapy

## 2020-05-02 ENCOUNTER — Ambulatory Visit: Payer: 59 | Admitting: Occupational Therapy

## 2020-05-02 ENCOUNTER — Ambulatory Visit: Payer: 59

## 2020-05-02 ENCOUNTER — Other Ambulatory Visit: Payer: Self-pay

## 2020-05-02 DIAGNOSIS — M6281 Muscle weakness (generalized): Secondary | ICD-10-CM

## 2020-05-02 DIAGNOSIS — I69354 Hemiplegia and hemiparesis following cerebral infarction affecting left non-dominant side: Secondary | ICD-10-CM

## 2020-05-02 DIAGNOSIS — R2681 Unsteadiness on feet: Secondary | ICD-10-CM

## 2020-05-02 DIAGNOSIS — R2689 Other abnormalities of gait and mobility: Secondary | ICD-10-CM

## 2020-05-02 DIAGNOSIS — R278 Other lack of coordination: Secondary | ICD-10-CM

## 2020-05-02 DIAGNOSIS — R41842 Visuospatial deficit: Secondary | ICD-10-CM

## 2020-05-02 LAB — BASIC METABOLIC PANEL
BUN/Creatinine Ratio: 13 (ref 9–23)
BUN: 13 mg/dL (ref 6–24)
CO2: 19 mmol/L — ABNORMAL LOW (ref 20–29)
Calcium: 9.1 mg/dL (ref 8.7–10.2)
Chloride: 110 mmol/L — ABNORMAL HIGH (ref 96–106)
Creatinine, Ser: 1.03 mg/dL — ABNORMAL HIGH (ref 0.57–1.00)
GFR calc Af Amer: 79 mL/min/{1.73_m2} (ref >59)
GFR calc non Af Amer: 68 mL/min/{1.73_m2} (ref >59)
Glucose: 189 mg/dL — ABNORMAL HIGH (ref 65–99)
Potassium: 4 mmol/L (ref 3.5–5.2)
Sodium: 144 mmol/L (ref 134–144)

## 2020-05-02 LAB — PRO B NATRIURETIC PEPTIDE: NT-Pro BNP: 367 pg/mL — ABNORMAL HIGH (ref 0–130)

## 2020-05-02 NOTE — Progress Notes (Signed)
Called pt, no answer. left vm requesting call back. AD/S

## 2020-05-02 NOTE — Progress Notes (Signed)
Please inform patient that her electrolytes are stable. However, her creatinine (measure of kidney function) is slightly elevated. Please instruct patient to increase her water intake. We will monitor and discuss further at upcoming visit.

## 2020-05-02 NOTE — Therapy (Signed)
[facility] [address] [place], [name], [address] Phone: [phone]   Fax:  [phone]  Occupational Therapy Treatment  Patient Details  Name: Shelly Shields MRN: [id number] Date of Birth: 30-Jan-1981 Referring Provider (OT): [facility] - referred - will send reports to PCP per patient's request - Dr Salvadore Oxford   Encounter Date: 05/02/2020   OT End of Session - 05/02/20 1534    Visit Number 5    Number of Visits 17    Date for OT Re-Evaluation 06/25/20    Authorization Type 60 combined visits OT/PT/SLP [facility])    OT Start Time 1533    OT Stop Time 1615    OT Time Calculation (min) 42 min    Activity Tolerance Patient tolerated treatment well    Behavior During Therapy [facility] for tasks assessed/performed;Flat affect           Past Medical History:  Diagnosis Date  . Asthma   . COVID-19 long hauler   . Diabetes mellitus without complication ([place])   . Hypertension   . Migraines   . Obesity   . Pulmonary embolism ([place]) 05/2019   with COVID  . Sleep apnea     Past Surgical History:  Procedure Laterality Date  . BILATERAL CARPAL TUNNEL RELEASE    . TEE WITHOUT CARDIOVERSION N/A 03/28/2020   Procedure: TRANSESOPHAGEAL ECHOCARDIOGRAM (TEE);  Surgeon: Adrian Prows, MD;  Location: [place];  Service: Cardiovascular;  Laterality: N/A;    There were no vitals filed for this visit.   Subjective Assessment - 05/02/20 1534    Subjective  Pt denies any pain. "Loss of strength in my hand but not too much"    Pertinent History Covid, Migraines, DM, Sleep apnea    Currently in Pain? No/denies                        OT Treatments/Exercises (OP) - 05/02/20 1541      Exercises   Exercises Hand;Work Hardening      Cognitive Exercises   Keyboarding animal clouds game with min difficulty and score of approx 1500      Work Hardening Exercises   UBE (Upper Arm Bike) 6 minutes on level 2        Visual/Perceptual Exercises   Copy this Image Pegboard    Pegboard no difficulty with copying pattern    Other Exercises Environmental Scanning with cognitive factor - environmental scanning for numbers in descending order 15-1 with min difficulty and approx 90% accuracy. Pt with good navigation and with recall of some numbers throughout the room       Fine Motor Coordination (Hand/Wrist)   Fine Motor Coordination Small Pegboard    Small Pegboard w LUE with min drops and no difficulty with manipulation                    OT Short Term Goals - 04/27/20 1339      OT SHORT TERM GOAL #1   Title Patient will complete HEP designed to improve coordiantion in non dominant LUE    Time 4    Period Weeks    Status On-going    Target Date 05/26/20      OT SHORT TERM GOAL #2   Title Patient will type three full sentences using two hands on keyboard in less than 5 minutes    Time 4    Period Weeks    Status On-going  OT SHORT TERM GOAL #3   Title Patient will navigate through empty hallway in familiar env't without hitting any obstacles placed in left field    Time 4    Period Weeks    Status On-going      OT SHORT TERM GOAL #4   Title Patient will prepare a simple, familiar hot dish using stove top or oven with minimal cueing    Time 4    Period Weeks    Status Achieved   met pt performed with supervision     OT SHORT TERM GOAL #5   Title Patient will demonstrate 3lb increase in grip strength in BUE's    Time 4    Period Weeks    Status On-going             OT Long Term Goals - 04/11/20 1900      OT LONG TERM GOAL #1   Title Patient will complete an updated HEP to address overall upper body strength and conditioning    Time 8    Period Weeks    Status New    Target Date 06/25/20      OT LONG TERM GOAL #2   Title Patient will demonstrate a 5 lb increase in BUE strength to aide in opening packages and carrying items    Time 8    Period Weeks    Status  New      OT LONG TERM GOAL #3   Title Patient will demonstrate a 5 sec reduction in time on 9 hole peg test to aide in 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 Pt continues to benefit from OT to target listed areas of deficit.    OT Occupational Profile and History Detailed Assessment- Review of Records and additional review of physical, cognitive, psychosocial history related to current functional performance    Occupational performance deficits (Please refer to evaluation for details): ADL's;IADL's;Work;Leisure    Body Structure / Function / Physical Skills ADL;Coordination;Endurance;UE functional use;Balance;Decreased knowledge of use of DME;Flexibility;IADL;Vision;Dexterity;FMC;Strength    OT Frequency 2x / week    OT Duration 8 weeks    OT Treatment/Interventions Self-care/ADL training;Therapeutic exercise;Visual/perceptual remediation/compensation;Patient/family education;Neuromuscular education;Therapeutic activities;Balance training;DME and/or AE instruction;Cognitive remediation/compensation;Energy conservation;Functional Mobility Training    Plan grip strengthening, LUE coordination    Consulted and Agree with Plan of Care Patient           Patient will benefit from skilled therapeutic intervention in order to improve the following deficits and impairments:   Body Structure / Function / Physical Skills: ADL, Coordination, Endurance, UE functional use, Balance, Decreased knowledge of  use of DME, Flexibility, IADL, Vision, Dexterity, FMC, Strength       Visit Diagnosis: Muscle weakness (generalized)  Hemiplegia and hemiparesis following cerebral  infarction affecting left non-dominant side ([place])  Other lack of coordination  Visuospatial deficit    Problem List Patient Active Problem List   Diagnosis Date Noted  . Acute CVA (cerebrovascular accident) ([place]) 03/27/2020  . Sleep apnea   . Diabetes mellitus without complication ([place])   . COVID-19 long hauler   . Obesity   . Acute pulmonary embolism ([place]) 06/30/2019  . Pulmonary embolism ([place]) 05/2019  . Hypernatremia   . Asthma, chronic   . DKA (diabetic ketoacidoses) 11/27/2014  . Diabetes mellitus, new onset ([place])   . Hyperglycemic hyperosmolar nonketotic coma ([place])   . Essential hypertension     Zachery Conch MOT, OTR/L  05/02/2020, 4:11 PM  [facility] [address] [place], [name], [address] Phone: [phone]   Fax:  [phone]  Name: ROSS HEFFERAN MRN: [id number] Date of Birth: [birth year]

## 2020-05-02 NOTE — Therapy (Signed)
[facility] [address] [place], [place], [address] Phone: [phone]   Fax:  [phone]  Physical Therapy Treatment  Patient Details  Name: Shelly Shields MRN: [id number] Date of Birth: November 18, 1980 Referring Provider (PT): Huey Bienenstock referred (hospitalist) but PCP Arlan Organ   Encounter Date: 05/02/2020   PT End of Session - 05/02/20 1616    Visit Number 4    Number of Visits 9    Authorization Type [employer]    PT Start Time [id number]   pt request use bathroom prior to session   PT Stop Time 1658    PT Time Calculation (min) 41 min    Equipment Utilized During Treatment Gait belt    Activity Tolerance Patient tolerated treatment well    Behavior During Therapy [facility] for tasks assessed/performed;Flat affect           Past Medical History:  Diagnosis Date  . Asthma   . COVID-19 long hauler   . Diabetes mellitus without complication (HCC)   . Hypertension   . Migraines   . Obesity   . Pulmonary embolism (HCC) 05/2019   with COVID  . Sleep apnea     Past Surgical History:  Procedure Laterality Date  . BILATERAL CARPAL TUNNEL RELEASE    . TEE WITHOUT CARDIOVERSION N/A 03/28/2020   Procedure: TRANSESOPHAGEAL ECHOCARDIOGRAM (TEE);  Surgeon: Yates Decamp, MD;  Location: [facility] ENDOSCOPY;  Service: Cardiovascular;  Laterality: N/A;    There were no vitals filed for this visit.   Subjective Assessment - 05/02/20 1617    Subjective Patietn report mild SOB after finishing with OT on bicycle machine. Patient reports no changes/complaints since last visit. No falls. No pain.    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 PMH HTN HLD DM2 obese OSA on cpap COVID with pulmonary embolism vaculitis    Limitations Lifting;House hold activities    Currently in Pain? No/denies    Pain Onset In the  past 7 days                 [facility] Adult PT Treatment/Exercise - 05/02/20 0001      Transfers   Transfers Sit to Stand;Stand to Sit    Sit to Stand 5: Supervision    Stand to Sit 5: Supervision    Number of Reps 10 reps;2 sets    Comments completed sit <> stand with BLE placed on airex pad. supervision throughout.        Ambulation/Gait   Ambulation/Gait Yes    Ambulation/Gait Assistance 5: Supervision    Ambulation/Gait Assistance Details completed ambulation x 5 minutes (approx 550 ft) working on improved endurance and distance ambulation. Patient able to ambulate x 5 minutes prior to needing rest break. Mild SOB at end of ambulation 4/10.     Ambulation Distance (Feet) 550 Feet    Assistive device None    Gait Pattern Step-through pattern;Narrow base of support;Scissoring    Ambulation Surface Level;Indoor      High Level Balance   High Level Balance Activities Tandem walking;Negotiating over obstacles    High Level Balance Comments completed obstacle course including negotiating/stepping over orange hurdles followed by tandem walking, completed x 2 laps down and back. Completed x 2 laps down and back with addition of dual task including counting backwards by 2's and naming vegetables/fruits. Increased challenge noted.  Neuro Re-ed    Neuro Re-ed Details  Standing on airex pad completing alternaitng toe taps to cone forward x 10 reps each. Progressed to completing crossover toe taps x 10 reps. Increased balance challenge requiring intermittent UE support from chair. Verbal cues from PT for slowed pace required.       Exercises   Exercises Knee/Hip      Knee/Hip Exercises: Aerobic   Other Aerobic Completed SciFit on Level 2.5 with BLE only x 6 minutes for improved endurance and activity tolerance. short rest break required due to increased pace today with completion.              Balance Exercises - 05/02/20 0001      Balance Exercises: Standing   Rockerboard  Anterior/posterior;EO;EC;Lateral;Intermittent UE support;Limitations    Rockerboard Limitations Standing on rockerboard positionjed A/P standing with eyes open holding steady x 1 minute, then progressed to EC 2 x 30 seconds. Completed anterior/posterior weight shift with light UE support required for completion, increased challenge with posterior weight shift, completed x 10 reps. With board positioned laterally completed the following: standing with eyes open holding steady x 1 minute, then progressed to EC 2 x 30 seconds. Completed lateral weight shift x 10 reps each direction. Increased challenge with EC with board positioned laterally.                PT Short Term Goals - 04/11/20 1821      PT SHORT TERM GOAL #1   Title Pt will be independent with initial HEP.    Baseline NA    Time 4    Period Weeks    Status New    Target Date 05/09/20      PT SHORT TERM GOAL #2   Title Pt will increase FGA to >/= 25/30 for improved functional balance.    Baseline 21/30 04/11/20    Time 4    Period Weeks    Status New    Target Date 05/09/20      PT SHORT TERM GOAL #3   Title Pt will improve gait speed to 0.7 m/s for improved access to community.    Baseline 0.6 m/s 04/11/20    Time 4    Period Weeks    Status New    Target Date 05/09/20      PT SHORT TERM GOAL #4   Title Pt will be able to ambulate 500+ feet with <4 RPE with no AD and supervsion.    Baseline NA    Time 4    Period Weeks    Status New    Target Date 05/09/20             PT Long Term Goals - 04/19/20 1553      PT LONG TERM GOAL #1   Title Pt will be independent with progressive HEP.    Baseline NA    Time 8    Period Weeks    Status New      PT LONG TERM GOAL #2   Title Pt will increase FOTO from 68 to >/= 80.    Baseline 68 04/11/20    Time 8    Period Weeks    Status New      PT LONG TERM GOAL #3   Title Pt will be able to ambulate 1,000+ feet with <4 RPE with no AD and supervsion.    Baseline  NT    Time 8    Period Weeks  Status New      PT LONG TERM GOAL #4   Title Patient will be able to complete >/= 1,000 ft with test to demonstrate improved endurance.    Baseline 846    Time 8    Period Weeks    Status Revised      PT LONG TERM GOAL #5   Title Pt will score >27 on FGA for improved dynamic balance.    Baseline 21/30 04/11/20    Time 8    Period Weeks    Status New                 Plan - 05/02/20 1654    Clinical Impression Statement Today's skilled PT session focused on continued gait training working toward improved ambulation distances. Patient able to ambulate for 5 minutes without rest break today today during session, mild SOB noted. Continued rest of session focused on BLE strengthening and high-level balance activities, intermittent rest breaks required due to fatigue. Will continue per POC, and progress toward all LTGs.    Personal Factors and Comorbidities Past/Current Experience;Comorbidity 3+    Comorbidities PMH: HTN HLD DM2 obese OSA on cpap COVID with pulmonary embolism vaculitis    Examination-Activity Limitations Squat;Stairs;Locomotion Level;Lift    Examination-Participation Restrictions Occupation;Community Activity    Stability/Clinical Decision Making Evolving/Moderate complexity    Rehab Potential Good    PT Frequency 1x / week   plus eval   PT Duration 8 weeks    PT Treatment/Interventions Gait training;Stair training;Functional mobility training;Therapeutic activities;Therapeutic exercise;Balance training;Neuromuscular re-education;Patient/family education;ADLs/Self Care Home Management;Visual/perceptual remediation/compensation;Energy conservation;Vestibular;Manual techniques    PT Next Visit Plan Continue high level balance/strengthening. Scifit/Nustep for endurance. Continue to progress HEP as able.    Consulted and Agree with Plan of Care Patient           Patient will benefit from skilled therapeutic intervention in order  to improve the following deficits and impairments:  Abnormal gait, Obesity, Decreased strength, Decreased balance, Improper body mechanics, Impaired vision/preception, Impaired perceived functional ability, Difficulty walking, Decreased coordination, Cardiopulmonary status limiting activity, Decreased activity tolerance, Decreased endurance  Visit Diagnosis: Muscle weakness (generalized)  Hemiplegia and hemiparesis following cerebral infarction affecting left non-dominant side (HCC)  Unsteadiness on feet  Other abnormalities of gait and mobility     Problem List Patient Active Problem List   Diagnosis Date Noted  . Acute CVA (cerebrovascular accident) (HCC) 03/27/2020  . Sleep apnea   . Diabetes mellitus without complication (HCC)   . COVID-19 long hauler   . Obesity   . Acute pulmonary embolism (HCC) 06/30/2019  . Pulmonary embolism (HCC) 05/2019  . Hypernatremia   . Asthma, chronic   . DKA (diabetic ketoacidoses) 11/27/2014  . Diabetes mellitus, new onset (HCC)   . Hyperglycemic hyperosmolar nonketotic coma (HCC)   . Essential hypertension     Tempie Donning, PT, DPT 05/02/2020, 5:02 PM  [facility] [address] [place], [place], [address] Phone: [phone]   Fax:  [phone]  Name: Shelly Shields MRN: [id number] Date of Birth: May 07, 1981

## 2020-05-02 NOTE — Progress Notes (Signed)
Pt called back, pt is aware of metabolic panel results. AD/S

## 2020-05-04 ENCOUNTER — Ambulatory Visit: Payer: 59 | Admitting: Occupational Therapy

## 2020-05-04 ENCOUNTER — Encounter: Payer: Self-pay | Admitting: Occupational Therapy

## 2020-05-04 ENCOUNTER — Other Ambulatory Visit: Payer: Self-pay

## 2020-05-04 DIAGNOSIS — M6281 Muscle weakness (generalized): Secondary | ICD-10-CM | POA: Diagnosis not present

## 2020-05-04 DIAGNOSIS — R278 Other lack of coordination: Secondary | ICD-10-CM

## 2020-05-04 DIAGNOSIS — I69354 Hemiplegia and hemiparesis following cerebral infarction affecting left non-dominant side: Secondary | ICD-10-CM

## 2020-05-04 DIAGNOSIS — R41842 Visuospatial deficit: Secondary | ICD-10-CM

## 2020-05-04 NOTE — Therapy (Signed)
[facility] [address] [place], [name], [address] Phone: [phone]   Fax:  [phone]  Occupational Therapy Treatment  Patient Details  Name: Shelly Shields MRN: [id number] Date of Birth: [birth year] Referring Provider (OT): [facility] - referred - will send reports to PCP per patient's request - Dr Salvadore Oxford   Encounter Date: 05/04/2020   OT End of Session - 05/04/20 1603    Visit Number 6    Number of Visits 17    Date for OT Re-Evaluation 06/25/20    Authorization Type 60 combined visits OT/PT/SLP [facility])    OT Start Time 1535    OT Stop Time 1615    OT Time Calculation (min) 40 min    Activity Tolerance Patient tolerated treatment well    Behavior During Therapy [facility] for tasks assessed/performed;Flat affect           Past Medical History:  Diagnosis Date  . Asthma   . COVID-19 long hauler   . Diabetes mellitus without complication ([place])   . Hypertension   . Migraines   . Obesity   . Pulmonary embolism ([place]) 05/2019   with COVID  . Sleep apnea     Past Surgical History:  Procedure Laterality Date  . BILATERAL CARPAL TUNNEL RELEASE    . TEE WITHOUT CARDIOVERSION N/A 03/28/2020   Procedure: TRANSESOPHAGEAL ECHOCARDIOGRAM (TEE);  Surgeon: Adrian Prows, MD;  Location: [facility];  Service: Cardiovascular;  Laterality: N/A;    There were no vitals filed for this visit.   Subjective Assessment - 05/04/20 1535    Pertinent History Covid, Migraines, DM, Sleep apnea    Currently in Pain? No/denies              Treatment: Purdue pegboard for LUE fince motor coordination and in hand manipulation, min difficulty overall. (Pt had difficulty picking up washer pieces due to artifical nails) Typing activities: animals, with good speed and accuracy using bilateral UE's,  Pt typed 8 sentences in 5 mins using bilateral UE's with only 5 errors. Arm bike x 5 mins level 3 for UE strength and  conditioning                     OT Short Term Goals - 05/04/20 1540      OT SHORT TERM GOAL #1   Title Patient will complete HEP designed to improve coordiantion in non dominant LUE    Time 4    Period Weeks    Status On-going    Target Date 05/26/20      OT SHORT TERM GOAL #2   Title Patient will type three full sentences using two hands on keyboard in less than 5 minutes    Time 4    Period Weeks    Status On-going      OT SHORT TERM GOAL #3   Title Patient will navigate through empty hallway in familiar env't without hitting any obstacles placed in left field    Time 4    Period Weeks    Status On-going      OT SHORT TERM GOAL #4   Title Patient will prepare a simple, familiar hot dish using stove top or oven with minimal cueing    Time 4    Period Weeks    Status Achieved   met pt performed with supervision     OT SHORT TERM GOAL #5   Title Patient will demonstrate 3lb increase in grip strength  in BUE's    Time 4    Period Weeks    Status On-going   met for RUE R 69.8 lbs, LUE 34.6 lbs            OT Long Term Goals - 04/11/20 1900      OT LONG TERM GOAL #1   Title Patient will complete an updated HEP to address overall upper body strength and conditioning    Time 8    Period Weeks    Status New    Target Date 06/25/20      OT LONG TERM GOAL #2   Title Patient will demonstrate a 5 lb increase in BUE strength to aide in opening packages and carrying items    Time 8    Period Weeks    Status New      OT LONG TERM GOAL #3   Title Patient will demonstrate a 5 sec reduction in time on 9 hole peg test to aide in coordiantion 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 Pt demonstrates improving coordination and hand strength.    OT Occupational Profile and History Detailed Assessment- Review of Records and additional review of physical, cognitive, psychosocial history related to current functional performance    Occupational performance deficits (Please refer to evaluation for details): ADL's;IADL's;Work;Leisure    Body Structure / Function / Physical Skills ADL;Coordination;Endurance;UE functional use;Balance;Decreased knowledge of use of DME;Flexibility;IADL;Vision;Dexterity;FMC;Strength    OT Frequency 2x / week    OT Duration 8 weeks    OT Treatment/Interventions Self-care/ADL training;Therapeutic exercise;Visual/perceptual remediation/compensation;Patient/family education;Neuromuscular education;Therapeutic activities;Balance training;DME and/or AE instruction;Cognitive remediation/compensation;Energy conservation;Functional Mobility Training    Plan environmental scanning, typing, work Chief Executive Officer and Agree with Plan of Care Patient           Patient will benefit from skilled therapeutic intervention in order to improve the following deficits and impairments:   Body Structure / Function / Physical Skills: ADL,Coordination,Endurance,UE functional use,Balance,Decreased knowledge of use of DME,Flexibility,IADL,Vision,Dexterity,FMC,Strength       Visit Diagnosis: Muscle weakness (generalized)  Hemiplegia and hemiparesis following cerebral infarction affecting left non-dominant side (HCC)  Other lack of coordination  Visuospatial deficit    Problem List Patient Active Problem List   Diagnosis Date Noted  . Acute CVA (cerebrovascular accident) ([place]) 03/27/2020  . Sleep apnea   . Diabetes mellitus without complication ([place])    . COVID-19 long hauler   . Obesity   . Acute pulmonary embolism ([place]) 06/30/2019  . Pulmonary embolism ([place]) 05/2019  . Hypernatremia   . Asthma, chronic   . DKA (diabetic ketoacidoses) 11/27/2014  . Diabetes mellitus, new onset ([place])   . Hyperglycemic hyperosmolar nonketotic coma ([place])   . Essential hypertension     , 05/04/2020, 4:04 PM  [facility] [address] [place], [name], [address] Phone: [phone]   Fax:  [phone]  Name: Shelly Shields MRN: [id number] Date of Birth: May 22, 1981

## 2020-05-09 ENCOUNTER — Ambulatory Visit: Payer: 59

## 2020-05-09 ENCOUNTER — Ambulatory Visit: Payer: 59 | Admitting: Occupational Therapy

## 2020-05-09 ENCOUNTER — Other Ambulatory Visit: Payer: Self-pay

## 2020-05-09 DIAGNOSIS — R278 Other lack of coordination: Secondary | ICD-10-CM

## 2020-05-09 DIAGNOSIS — R41842 Visuospatial deficit: Secondary | ICD-10-CM

## 2020-05-09 DIAGNOSIS — M6281 Muscle weakness (generalized): Secondary | ICD-10-CM | POA: Diagnosis not present

## 2020-05-09 DIAGNOSIS — R2681 Unsteadiness on feet: Secondary | ICD-10-CM

## 2020-05-09 DIAGNOSIS — I69354 Hemiplegia and hemiparesis following cerebral infarction affecting left non-dominant side: Secondary | ICD-10-CM

## 2020-05-09 DIAGNOSIS — R41841 Cognitive communication deficit: Secondary | ICD-10-CM

## 2020-05-09 DIAGNOSIS — R4701 Aphasia: Secondary | ICD-10-CM

## 2020-05-09 DIAGNOSIS — R2689 Other abnormalities of gait and mobility: Secondary | ICD-10-CM

## 2020-05-09 DIAGNOSIS — R471 Dysarthria and anarthria: Secondary | ICD-10-CM

## 2020-05-09 NOTE — Therapy (Signed)
[facility]helly Shield[facility] [address] [place], [name], [address] Phone: [phone]   Fax:  [phone]  Occupational Therapy Treatment  Patient Details  Name: Shelly Shields MRN: [id number] Date of Birth: [birth year] Referring Provider (OT): [facility] - referred - will send reports to PCP per patient's request - Dr Salvadore Oxford   Encounter Date: 05/09/2020   OT End of Session - 05/09/20 1233    Visit Number 7    Number of Visits 17    Date for OT Re-Evaluation 06/25/20    Authorization Type 60 combined visits OT/PT/SLP [facility])    Authorization Time Period Week 4 of 8 (12/14)    OT Start Time 1233    OT Stop Time 1315    OT Time Calculation (min) 42 min    Activity Tolerance Patient tolerated treatment well    Behavior During Therapy [facility] for tasks assessed/performed;Flat affect           Past Medical History:  Diagnosis Date  . Asthma   . COVID-19 long hauler   . Diabetes mellitus without complication ([place])   . Hypertension   . Migraines   . Obesity   . Pulmonary embolism ([place]) 05/2019   with COVID  . Sleep apnea     Past Surgical History:  Procedure Laterality Date  . BILATERAL CARPAL TUNNEL RELEASE    . TEE WITHOUT CARDIOVERSION N/A 03/28/2020   Procedure: TRANSESOPHAGEAL ECHOCARDIOGRAM (TEE);  Surgeon: Shelly Prows, MD;  Location: [facility];  Service: Cardiovascular;  Laterality: N/A;    There were no vitals filed for this visit.   Subjective Assessment - 05/09/20 1233    Subjective  I have some pain where my carpal tunnel surgery was (from 2019)    Pertinent History Covid, Migraines, DM, Sleep apnea    Currently in Pain? Yes    Pain Score 3     Pain Location Hand    Pain Orientation Left;Right    Pain Descriptors / Indicators Nagging;Numbness    Pain Type Chronic pain;Neuropathic pain    Pain Onset More than a month ago    Pain Frequency Constant                        OT  Treatments/Exercises (OP) - 05/09/20 1243      Exercises   Exercises Work Hardening      Cognitive Exercises   Keyboarding typing practice with inputting informtaion (name and phone number) into spreadsheet with increaesd time required      Work Geophysical data processor Bike UEB x 6 minutes - 3 forward and 3 backward for conditioning on level 2      Visual/Perceptual Exercises   Other Exercises Environmental Scanning 93% accuracy this day with missing one on the left side in max distracting environment      Fine Motor Coordination (Hand/Wrist)   Fine Motor Coordination Grooved pegs    Grooved pegs w/ LUE with increased time but no drops                    OT Short Term Goals - 05/04/20 1540      OT SHORT TERM GOAL #1   Title Patient will complete HEP designed to improve coordiantion in non dominant LUE    Time 4    Period Weeks    Status On-going    Target Date 05/26/20      OT SHORT TERM GOAL #2  Title Patient will type three full sentences using two hands on keyboard in less than 5 minutes    Time 4    Period Weeks    Status On-going      OT SHORT TERM GOAL #3   Title Patient will navigate through empty hallway in familiar env't without hitting any obstacles placed in left field    Time 4    Period Weeks    Status On-going      OT SHORT TERM GOAL #4   Title Patient will prepare a simple, familiar hot dish using stove top or oven with minimal cueing    Time 4    Period Weeks    Status Achieved   met pt performed with supervision     OT SHORT TERM GOAL #5   Title Patient will demonstrate 3lb increase in grip strength in BUE's    Time 4    Period Weeks    Status On-going   met for RUE R 69.8 lbs, LUE 34.6 lbs            OT Long Term Goals - 04/11/20 1900      OT LONG TERM GOAL #1   Title Patient will complete an updated HEP to address overall upper body strength and conditioning    Time 8    Period Weeks    Status New    Target Date  06/25/20      OT LONG TERM GOAL #2   Title Patient will demonstrate a 5 lb increase in BUE strength to aide in opening packages and carrying items    Time 8    Period Weeks    Status New      OT LONG TERM GOAL #3   Title Patient will demonstrate a 5 sec reduction in time on 9 hole peg test to aide in coordiantion 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 Pt continues to demonstrate slower typing speed and accuracy impeding back to work functioning    OT Occupational Profile and History Detailed Assessment- Review of Records and additional review of physical, cognitive, psychosocial history related to current functional performance    Occupational performance deficits (Please refer to evaluation for details): ADL's;IADL's;Work;Leisure    Body Structure / Function / Physical Skills ADL;Coordination;Endurance;UE functional use;Balance;Decreased knowledge of use of DME;Flexibility;IADL;Vision;Dexterity;FMC;Strength    OT Frequency 2x / week    OT Duration 8 weeks    OT Treatment/Interventions Self-care/ADL training;Therapeutic exercise;Visual/perceptual remediation/compensation;Patient/family education;Neuromuscular education;Therapeutic activities;Balance training;DME and/or AE instruction;Cognitive remediation/compensation;Energy conservation;Functional Mobility Training    Plan environmental scanning w cog component, typing, work skills    Consulted and Agree with Plan of Care Patient           Patient  will benefit from skilled therapeutic intervention in  order to improve the following deficits and impairments:   Body Structure / Function / Physical Skills: ADL,Coordination,Endurance,UE functional use,Balance,Decreased knowledge of use of DME,Flexibility,IADL,Vision,Dexterity,FMC,Strength       Visit Diagnosis: Hemiplegia and hemiparesis following cerebral infarction affecting left non-dominant side (HCC)  Muscle weakness (generalized)  Unsteadiness on feet  Other abnormalities of gait and mobility  Other lack of coordination  Visuospatial deficit    Problem List Patient Active Problem List   Diagnosis Date Noted  . Acute CVA (cerebrovascular accident) ([place]) 03/27/2020  . Sleep apnea   . Diabetes mellitus without complication ([place])   . COVID-19 long hauler   . Obesity   . Acute pulmonary embolism ([place]) 06/30/2019  . Pulmonary embolism ([place]) 05/2019  . Hypernatremia   . Asthma, chronic   . DKA (diabetic ketoacidoses) 11/27/2014  . Diabetes mellitus, new onset ([place])   . Hyperglycemic hyperosmolar nonketotic coma ([place])   . Essential hypertension     Zachery Conch MOT, OTR/L  05/09/2020, 1:08 PM  [facility] [address] [place], [name], [address] Phone: [phone]   Fax:  [phone]  Name: Shelly Shields MRN: [id number] Date of Birth: [birth year]

## 2020-05-09 NOTE — Patient Instructions (Signed)
Access Code: [email or web address] URL: [email or web address] Date: 05/09/2020 Prepared by: Jethro Bastos  Exercises Sit to Stand without Arm Support - 1 x daily - 5 x weekly - 2 sets - 10 reps Standing Balance with Eyes Closed on Foam - 1 x daily - 5 x weekly - 1 sets - 3 reps - 30 hold Standing with Head Rotation - 1 x daily - 5 x weekly - 2 sets - 10 reps Standing with Head Nod - 1 x daily - 5 x weekly - 2 sets - 10 reps Heel rises with counter support - 1 x daily - 5 x weekly - 2 sets - 10 reps Standing Hip Extension with Counter Support - 1 x daily - 5 x weekly - 3 sets - 10 reps Standing Hip Abduction with Counter Support - 1 x daily - 5 x weekly - 3 sets - 10 reps

## 2020-05-09 NOTE — Patient Instructions (Signed)
   The problem you are having with word finding is called APHASIA.

## 2020-05-09 NOTE — Therapy (Signed)
[facility] [address] [place], [place], [address] Phone: [phone]   Fax:  [phone]  Speech Language Pathology Evaluation  Patient Details  Name: Shelly Shields MRN: [id number] Date of Birth: [birth year] Referring Provider (SLP): (ref) Dr. Ernest Mallick, (Documentation PCP) Arlan Organ, MD   Encounter Date: 05/09/2020   End of Session - 05/09/20 2330    Visit Number 1    Number of Visits 17    Date for SLP Re-Evaluation 08/07/20    Authorization - Number of Visits 60    SLP Start Time 1319    SLP Stop Time  1400    SLP Time Calculation (min) 41 min    Activity Tolerance Patient tolerated treatment well           Past Medical History:  Diagnosis Date  . Asthma   . COVID-19 long hauler   . Diabetes mellitus without complication (HCC)   . Hypertension   . Migraines   . Obesity   . Pulmonary embolism (HCC) 05/2019   with COVID  . Sleep apnea     Past Surgical History:  Procedure Laterality Date  . BILATERAL CARPAL TUNNEL RELEASE    . TEE WITHOUT CARDIOVERSION N/A 03/28/2020   Procedure: TRANSESOPHAGEAL ECHOCARDIOGRAM (TEE);  Surgeon: Yates Decamp, MD;  Location: [facility];  Service: Cardiovascular;  Laterality: N/A;    There were no vitals filed for this visit.   Subjective Assessment - 05/09/20 1325    Subjective "My talking is different, and my thinking isn't straight, and sometimes I can't find the words I want to say."    Currently in Pain? Yes    Pain Score 3     Pain Location Hand    Pain Orientation Right;Left    Pain Descriptors / Indicators Numbness;Nagging    Pain Type Chronic pain;Neuropathic pain    Pain Onset More than a month ago    Pain Frequency Constant              SLP Evaluation [facility] - 05/09/20 1325      SLP Visit Information   SLP Received On 05/09/20    Referring Provider (SLP) (ref) Dr. 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 PMH HTN HLD DM2 obese OSA on cpap COVID with pulmonary embolism vaculitis.       Subjective   Patient/Family Stated Goal "I want to be able to speak clearer, I want to think better"      Balance Screen   Has the patient fallen in the past 6 months No      Prior Functional Status   Cognitive/Linguistic Baseline Within functional limits    Type of Home House     Lives With Family    Available Support Family    Education B.S. public health; minor- biology    Vocation --   Paediatric nurse at [employer]   Overall Cognitive Status Impaired/Different from baseline    Area of Impairment Attention    Attention Comments "I lose my train of thought" a couple of times a day      Auditory Comprehension   Overall  Auditory Comprehension Appears within functional limits for tasks assessed      Verbal Expression   Overall Verbal Expression Other (comment)   pt reports difficulty with anomia "a few times a day";worse when emotional     Oral Motor/Sensory Function   Overall Oral Motor/Sensory Function Impaired    Labial ROM Within Functional Limits    Labial Symmetry Within Functional Limits    Labial Strength Within Functional Limits    Labial Coordination Reduced    Lingual Symmetry Abnormal symmetry left    Lingual Strength Reduced   slight(?)   Lingual Coordination Reduced      Motor Speech   Overall Motor Speech Impaired    Articulation Impaired    Level of Impairment Word    Intelligibility Intelligibility reduced   can be as low as 90%; intermittent   Word --   nearly 100%   Sentence --   nearly 100%   Conversation --   90-100% depending  on topic and/or emotion                          SLP Education - 05/09/20 2329    Education Details aphasia tips and definition, possible goals    Person(s) Educated Patient    Methods Explanation    Comprehension Verbalized understanding            SLP Short Term Goals - 05/09/20 2338      SLP SHORT TERM GOAL #1   Title pt will perform dysarhthria HEP with rare min x2 consecutive sessions    Time 4    Period Weeks   or 9 total sessions   Status New      SLP SHORT TERM GOAL #2   Title pt will complete aphasia testing PRN and goals added PRN    Time 2    Period Weeks    Status New      SLP SHORT TERM GOAL #3   Title pt will undergo cognitive communication testing and goals added PRN    Time 2    Period Weeks    Status New      SLP SHORT TERM GOAL #4   Title pt will use compensatory measures for speech intelligibilty of 95%+ in 10 minutes simple conversation x3 sessions    Time 4    Period Weeks    Status New            SLP Long Term Goals - 05/09/20 2342      SLP LONG TERM GOAL #1   Title pt will use compensatory measures for speech intelligibilty of 95%+ in 10 minutes mod complex conversation x3 sessions    Time 8    Period Weeks   or 17 total sessions, for all LTGs   Status New      SLP LONG TERM GOAL #2   Title pt will use compensations for speech intelligiblity in 19/20 sentence responses resulting in 100% intelligbility in 3 sessions    Time 8    Period Weeks    Status New            Plan - 05/09/20 2331    Clinical Impression Statement 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 She reports anomia "A few times a day" - testing for anomia and cognitive communication deficis to occur in teh first 2 weeks of therapy. 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    Speech Therapy Frequency 2x / week    Duration 8 weeks   or 17 total sessions   Treatment/Interventions Language facilitation;Cueing hierarchy;Cognitive reorganization;Internal/external aids;Patient/family education;Compensatory strategies;SLP instruction and feedback;Multimodal communcation approach;Functional tasks;Oral motor exercises    Potential to Achieve Goals Good    Consulted and Agree with Plan of Care Patient           Patient will benefit from skilled therapeutic intervention in order to improve the following deficits and impairments:   Dysarthria and anarthria - Plan: SLP plan of care cert/re-cert  Cognitive communication deficit - Plan: SLP plan of care cert/re-cert  Aphasia - Plan: SLP plan of care cert/re-cert    Problem List Patient Active Problem List   Diagnosis Date Noted  . Acute CVA (cerebrovascular accident) (HCC) 03/27/2020  . Sleep apnea   . Diabetes mellitus without complication (HCC)   . COVID-19 long hauler   . Obesity   . Acute pulmonary embolism (HCC) 06/30/2019  . Pulmonary embolism (HCC) 05/2019  . Hypernatremia   . Asthma, chronic   . DKA (diabetic ketoacidoses) 11/27/2014  . Diabetes mellitus, new onset (HCC)   . Hyperglycemic hyperosmolar nonketotic coma (HCC)   . Essential hypertension     , ,MS, CCC-SLP  05/09/2020, 11:48 PM  [facility] [address] [place], [place], [address] Phone: [phone]   Fax:  [phone]  Name: Shelly Shields MRN: [id number] Date of Birth: 06-Apr-1981

## 2020-05-09 NOTE — Therapy (Signed)
[facility] [address] [place], [name], [address] Phone: [phone]   Fax:  [phone]  Physical Therapy Treatment  Patient Details  Name: Shelly Shields MRN: [id number] Date of Birth: [birth year] Referring Provider (PT): Phillips Climes referred (hospitalist) but PCP Salvadore Oxford   Encounter Date: 05/09/2020   PT End of Session - 05/09/20 1150    Visit Number 5    Number of Visits [place]    PT Start Time 0017    PT Stop Time 1230    PT Time Calculation (min) 43 min    Equipment Utilized During Treatment Gait belt    Activity Tolerance Patient tolerated treatment well    Behavior During Therapy [facility] for tasks assessed/performed;Flat affect           Past Medical History:  Diagnosis Date  . Asthma   . COVID-19 long hauler   . Diabetes mellitus without complication ([place])   . Hypertension   . Migraines   . Obesity   . Pulmonary embolism ([place]) 05/2019   with COVID  . Sleep apnea     Past Surgical History:  Procedure Laterality Date  . BILATERAL CARPAL TUNNEL RELEASE    . TEE WITHOUT CARDIOVERSION N/A 03/28/2020   Procedure: TRANSESOPHAGEAL ECHOCARDIOGRAM (TEE);  Surgeon: Adrian Prows, MD;  Location: [place];  Service: Cardiovascular;  Laterality: N/A;    There were no vitals filed for this visit.   Subjective Assessment - 05/09/20 1150    Subjective No new changes since last visit. Reports has appt with Cardiologist on Thursday. No falls.    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 PMH HTN HLD DM2 obese OSA on cpap COVID with pulmonary embolism vaculitis    Limitations Lifting;House hold activities    Currently in Pain? No/denies    Pain Onset In the past 7 days              [facility] PT Assessment - 05/09/20 1202      Functional Gait   Assessment   Gait assessed  Yes    Gait Level Surface Walks 20 ft in less than 7 sec but greater than 5.5 sec, uses assistive device, slower speed, mild gait deviations, or deviates 6-10 in outside of the 12 in walkway width.    Change in Gait Speed Able to change speed, demonstrates mild gait deviations, deviates 6-10 in outside of the 12 in walkway width, or no gait deviations, unable to achieve a major change in velocity, or uses a change in velocity, or uses an assistive device.    Gait with Horizontal Head Turns Performs head turns smoothly with slight change in gait velocity (eg, minor disruption to smooth gait path), deviates 6-10 in outside 12 in walkway width, or uses an assistive device.    Gait with Vertical Head Turns Performs head turns with no change in gait. Deviates no more than 6 in outside 12 in walkway width.    Gait and Pivot Turn Pivot turns safely within 3 sec and stops quickly with no loss of balance.    Step Over Obstacle Is able to step over one shoe box (4.5 in total height) without changing gait speed. No evidence of imbalance.    Gait with Narrow Base of Support Is able to ambulate for 10 steps heel to toe with  no staggering.    Gait with Eyes Closed Walks 20 ft, no assistive devices, good speed, no evidence of imbalance, normal gait pattern, deviates no more than 6 in outside 12 in walkway width. Ambulates 20 ft in less than 7 sec.    Ambulating Backwards Walks 20 ft, uses assistive device, slower speed, mild gait deviations, deviates 6-10 in outside 12 in walkway width.    Steps Alternating feet, must use rail.    Total Score 24    FGA comment: 24/30             [facility] Adult PT Treatment/Exercise - 05/09/20 0001      Transfers   Transfers Sit to Stand;Stand to Sit    Sit to Stand 6: Modified independent (Device/Increase time)    Stand to Sit 6: Modified independent (Device/Increase time)      Ambulation/Gait   Ambulation/Gait Yes    Ambulation/Gait Assistance  5: Supervision    Ambulation/Gait Assistance Details Completed ambulation x 580 ft without AD and supervision. At end of completion patient report SOB 6/10, RPE 7/10. Patient reports feeling like she was working pretty hard during completion.    Ambulation Distance (Feet) 580 Feet    Assistive device None    Gait Pattern Step-through pattern;Narrow base of support;Scissoring    Ambulation Surface Level;Indoor    Gait velocity 1.07 m/s      Exercises   Exercises Other Exercises    Other Exercises  Completed review of current HEP and progressed as tolerated. See medbridge program for details.           Access Code: [email or web address] URL: [email or web address] Date: 05/09/2020 Prepared by: Baldomero Lamy  Exercises Sit to Stand without Arm Support - 1 x daily - 5 x weekly - 2 sets - 10 reps Standing Balance with Eyes Closed on Foam - 1 x daily - 5 x weekly - 1 sets - 3 reps - 30 hold Standing with Head Rotation - 1 x daily - 5 x weekly - 2 sets - 10 reps Standing with Head Nod - 1 x daily - 5 x weekly - 2 sets - 10 reps  New Additions to HEP on 12/14:  Heel rises with counter support - 1 x daily - 5 x weekly - 2 sets - 10 reps Standing Hip Extension with Counter Support - 1 x daily - 5 x weekly - 3 sets - 10 reps Standing Hip Abduction with Counter Support - 1 x daily - 5 x weekly - 2 sets - 10 reps      PT Short Term Goals - 05/09/20 1152      PT SHORT TERM GOAL #1   Title Pt will be independent with initial HEP.    Baseline reports independence    Time 4    Period Weeks    Status Achieved    Target Date 05/09/20      PT SHORT TERM GOAL #2   Title Pt will increase FGA to >/= 25/30 for improved functional balance.    Baseline 21/30 04/11/20; 24/30 on 12/14    Time 4    Period Weeks    Status On-going    Target Date 05/09/20      PT SHORT TERM GOAL #3   Title Pt will improve gait speed to 0.7 m/s for improved access to community.    Baseline 0.6 m/s 04/11/20;  1.07 m/s    Time 4    Period Weeks    Status Achieved  Target Date 05/09/20      PT SHORT TERM GOAL #4   Title Pt will be able to ambulate 500+ feet with <4 RPE with no AD and supervsion.    Baseline 580 ft no AD supervision, RPE 7/10    Time 4    Period Weeks    Status Partially Met    Target Date 05/09/20             PT Long Term Goals - 04/19/20 1553      PT LONG TERM GOAL #1   Title Pt will be independent with progressive HEP.    Baseline NA    Time 8    Period Weeks    Status New      PT LONG TERM GOAL #2   Title Pt will increase FOTO from 68 to >/= 80.    Baseline 68 04/11/20    Time 8    Period Weeks    Status New      PT LONG TERM GOAL #3   Title Pt will be able to ambulate 1,000+ feet with <4 RPE with no AD and supervsion.    Baseline NT    Time 8    Period Weeks    Status New      PT LONG TERM GOAL #4   Title Patient will be able to complete >/= 1,000 ft with 6MWT test to demonstrate improved endurance.    Baseline 846    Time 8    Period Weeks    Status Revised      PT LONG TERM GOAL #5   Title Pt will score >27 on FGA for improved dynamic balance.    Baseline 21/30 04/11/20    Time 8    Period Weeks    Status New                 Plan - 05/09/20 1207    Clinical Impression Statement Today's skilled PT session included assessment of patient's progress toward STG. Patient able to meet STG #1 and 3. Patient demonstrating progress with balance, scoring 24/30 on FGA today. Patient continue to require intermittent short rest breaks throughout session due to increased fatigue. Patient will continue to benefit from skilled PT services to progress toward all LTGs.    Personal Factors and Comorbidities Past/Current Experience;Comorbidity 3+    Comorbidities PMH: HTN HLD DM2 obese OSA on cpap COVID with pulmonary embolism vaculitis    Examination-Activity Limitations Squat;Stairs;Locomotion Level;Lift    Examination-Participation Restrictions  Occupation;Community Activity    Stability/Clinical Decision Making Evolving/Moderate complexity    Rehab Potential Good    PT Frequency 1x / week   plus eval   PT Duration 8 weeks    PT Treatment/Interventions Gait training;Stair training;Functional mobility training;Therapeutic activities;Therapeutic exercise;Balance training;Neuromuscular re-education;Patient/family education;ADLs/Self Care Home Management;Visual/perceptual remediation/compensation;Energy conservation;Vestibular;Manual techniques    PT Next Visit Plan Continue high level balance/strengthening. Scifit/Nustep for endurance. Continue to progress HEP as able.    Consulted and Agree with Plan of Care Patient           Patient will benefit from skilled therapeutic intervention in order to improve the following deficits and impairments:  Abnormal gait,Obesity,Decreased strength,Decreased balance,Improper body mechanics,Impaired vision/preception,Impaired perceived functional ability,Difficulty walking,Decreased coordination,Cardiopulmonary status limiting activity,Decreased activity tolerance,Decreased endurance  Visit Diagnosis: Hemiplegia and hemiparesis following cerebral infarction affecting left non-dominant side (HCC)  Muscle weakness (generalized)  Unsteadiness on feet  Other abnormalities of gait and mobility     Problem List Patient Active  Problem List   Diagnosis Date Noted  . Acute CVA (cerebrovascular accident) ([place]) 03/27/2020  . Sleep apnea   . Diabetes mellitus without complication ([place])   . COVID-19 long hauler   . Obesity   . Acute pulmonary embolism ([place]) 06/30/2019  . Pulmonary embolism ([place]) 05/2019  . Hypernatremia   . Asthma, chronic   . DKA (diabetic ketoacidoses) 11/27/2014  . Diabetes mellitus, new onset ([place])   . Hyperglycemic hyperosmolar nonketotic coma ([place])   . Essential hypertension     Jones Bales 05/09/2020, 12:32 PM  [facility] [address] [place], [name], [address] Phone: [phone]   Fax:  [phone]  Name: DEANDREA VANPELT MRN: [id number] Date of Birth: 03-10-1981

## 2020-05-10 NOTE — Progress Notes (Addendum)
Primary Physician/Referring:  [name], MD  Patient ID: Shelly Shields, female    DOB: 1981-03-15, 39 y.o.   MRN: [id number]  Chief Complaint  Patient presents with  . Dilated cardiomyopathy    HPI:    Shelly Shields  is a 39 y.o. African-American female with hypertension, hyperlipidemia, uncontrolled diabetes mellitus, obesity and obstructive sleep apnea on CPAP and history of Covid pneumonia in January 2021 and also had pulmonary embolism during Covid infection.  She is also being evaluated for vasculitis. 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 Cardiomyopathy etiology likely stress cardiomyopathy vs significant coronary artery disease vs small PFO noted on TEE vs atrial fibrillation.   Patient presents for 2 week follow up of heart failure and medication titration.  At last visit increased carvedilol from 3.125 mg to 6.25 mg twice daily.  Patient continues to follow with neurology as well as with outpatient occupational and speech therapy, and physical therapy.  She is presently doing well without chest pain, palpitations, dyspnea, leg swelling, dizziness.   Past Medical History:  Diagnosis Date  . Asthma   . COVID-19 long hauler   . Diabetes mellitus without complication ([place])   . Hypertension   . Migraines   . Obesity   . Pulmonary embolism ([place]) 05/2019   with COVID  . Sleep apnea    Past Surgical History:  Procedure Laterality Date  . BILATERAL CARPAL TUNNEL RELEASE    . TEE WITHOUT CARDIOVERSION N/A 03/28/2020   Procedure: TRANSESOPHAGEAL ECHOCARDIOGRAM (TEE);  Surgeon: Adrian Prows, MD;  Location[facility] ENDOSCOPY;  Service: Cardiovascular;  Laterality: N/A;   Family History  Problem Relation Age of Onset  . Hypertension Mother   . Thyroid disease Mother   . Hypertension Father   . Stroke Father 65  . Diabetes Father   . Sleep apnea Father   . CVA Paternal Uncle  66    Social History   Tobacco Use  . Smoking status: Never Smoker  . Smokeless tobacco: Never Used  Substance Use Topics  . Alcohol use: Yes    Alcohol/week: 0.0 standard drinks    Comment: occasional    Marital Status: Single   ROS  Review of Systems  Constitutional: Negative for malaise/fatigue and weight gain.  Cardiovascular: Negative for chest pain, claudication, leg swelling, near-syncope, orthopnea, palpitations, paroxysmal nocturnal dyspnea and syncope.  Respiratory: Negative for shortness of breath.   Hematologic/Lymphatic: Does not bruise/bleed easily.  Gastrointestinal: Negative for melena.  Neurological: Negative for dizziness and weakness.    Objective  Blood pressure 124/84, pulse 100, height $RemoveBef'5\' 1"'mAQCJfMGYu$  (1.549 m), weight 222 lb (100.7 kg).  Vitals with BMI 05/17/2020 05/11/2020 04/27/2020  Height - $Remove'5\' 1"'ukRrbnE$  -  Weight - 222 lbs -  BMI - 64.33 -  Systolic 295 188 416  Diastolic 87 84 84  Pulse 606 100 73      Physical Exam Vitals reviewed.  Constitutional:      Appearance: She is obese.  HENT:     Head: Normocephalic and atraumatic.  Cardiovascular:     Rate and Rhythm: Normal rate and regular rhythm.     Pulses: Intact distal pulses.     Heart sounds: S1 normal and S2 normal. No murmur heard. No gallop.      Comments: No leg edema.  No JVD.  Pulmonary:     Effort: Pulmonary effort is normal. No respiratory distress.  Breath sounds: No wheezing, rhonchi or rales.  Musculoskeletal:     Right lower leg: No edema.     Left lower leg: No edema.  Neurological:     Mental Status: She is alert.     Cranial Nerves: Cranial nerve deficit present.     Gait: Gait abnormal (Ambulates with cane ).     Laboratory examination:   Recent Labs    02/06/20 2118 03/27/20 0946 03/29/20 0229 05/01/20 1612 05/22/20 1624  NA 139   < > 139 144 141  K 4.0   < > 3.0* 4.0 3.6  CL 108   < > 109 110* 111*  CO2 21*   < > 20* 19* 17*  GLUCOSE 238*   < > 187* 189* 251*   BUN 14   < > $R'9 13 11  'ye$ CREATININE 1.13*   < > 0.81 1.03* 0.95  CALCIUM 9.0   < > 8.7* 9.1 8.8  GFRNONAA >60   < > >60 68 75  GFRAA >60  --   --  79 87   < > = values in this interval not displayed.   estimated creatinine clearance is 85.7 mL/min (by C-G formula based on SCr of 0.95 mg/dL).  CMP Latest Ref Rng & Units 05/22/2020 05/01/2020 03/29/2020  Glucose 65 - 99 mg/dL 251(H) 189(H) 187(H)  BUN 6 - 24 mg/dL $Remove'11 13 9  'sEGkzIW$ Creatinine 0.57 - 1.00 mg/dL 0.95 1.03(H) 0.81  Sodium 134 - 144 mmol/L 141 144 139  Potassium 3.5 - 5.2 mmol/L 3.6 4.0 3.0(L)  Chloride 96 - 106 mmol/L 111(H) 110(H) 109  CO2 20 - 29 mmol/L 17(L) 19(L) 20(L)  Calcium 8.7 - 10.2 mg/dL 8.8 9.1 8.7(L)  Total Protein 6.5 - 8.1 g/dL - - -  Total Bilirubin 0.3 - 1.2 mg/dL - - -  Alkaline Phos 38 - 126 U/L - - -  AST 15 - 41 U/L - - -  ALT 0 - 44 U/L - - -   CBC Latest Ref Rng & Units 03/29/2020 03/27/2020 03/27/2020  WBC 4.0 - 10.5 K/uL 10.4 - 11.7(H)  Hemoglobin 12.0 - 15.0 g/dL 12.7 12.9 12.8  Hematocrit 36.0 - 46.0 % 38.3 38.0 39.3  Platelets 150 - 400 K/uL 245 - 195    Lipid Panel Recent Labs    03/28/20 0415  CHOL 176  TRIG 106  LDLCALC 126*  VLDL 21  HDL 29*  CHOLHDL 6.1    HEMOGLOBIN A1C Lab Results  Component Value Date   HGBA1C 11.2 (H) 03/27/2020   MPG 274.74 03/27/2020   TSH Recent Labs    03/28/20 0415  TSH 3.896    External labs:  None   Medications and allergies   Allergies  Allergen Reactions  . Lisinopril Cough  . Metformin Diarrhea  . Ibuprofen Hives  . Ketorolac Hives     Outpatient Medications Prior to Visit  Medication Sig Dispense Refill  . apixaban (ELIQUIS) 5 MG TABS tablet Take 1 tablet (5 mg total) by mouth 2 (two) times daily. 60 tablet 0  . Baclofen 5 MG TABS TAKE 1 TABLET BY MOUTH THREE TIMES DAILY AS NEEDED FOR HEADACHE OR NECK PAIN    . Blood Glucose Monitoring Suppl (ACCU-CHEK NANO SMARTVIEW) W/DEVICE KIT 1 Device by Does not apply route 4 (four) times daily  - after meals and at bedtime. 1 kit 0  . butalbital-acetaminophen-caffeine (FIORICET) 50-325-40 MG tablet Take 1 tablet by mouth every 6 (six) hours as needed for headache.  20 tablet 0  . escitalopram (LEXAPRO) 10 MG tablet Take 10 mg by mouth daily.    . fexofenadine (ALLEGRA) 180 MG tablet Take 180 mg by mouth daily as needed.     . fluticasone (FLONASE) 50 MCG/ACT nasal spray Place 2 sprays into both nostrils daily as needed for allergies.   3  . fluticasone (FLOVENT HFA) 110 MCG/ACT inhaler Inhale 1 puff into the lungs daily.     Marland Kitchen glucose blood (ACCU-CHEK SMARTVIEW) test strip Check sugar 6 x daily (Patient taking differently: Check sugar 6 x daily. Uses true test instead) 200 each 3  . insulin glargine (LANTUS) 100 UNIT/ML Solostar Pen Inject 80 Units into the skin 2 (two) times daily.    . insulin lispro protamine-lispro (HUMALOG 75/25 MIX) (75-25) 100 UNIT/ML SUSP injection Inject 50 Units into the skin 3 (three) times daily. Sliding scale    . Insulin Pen Needle (INSUPEN PEN NEEDLES) 32G X 4 MM MISC BD Pen Needles- brand specific. Inject insulin via insulin pen daily 200 each 3  . Lancet Devices (ACCU-CHEK SOFTCLIX) lancets Use as instructed for blood glucose checks four times daily, before meals and at bedtime 100 each 5  . medroxyPROGESTERone (DEPO-PROVERA) 150 MG/ML injection Inject 150 mg into the muscle every 3 (three) months.    . montelukast (SINGULAIR) 10 MG tablet Take 10 mg by mouth at bedtime.    . nortriptyline (PAMELOR) 10 MG capsule Take 10 mg by mouth at bedtime.    . ondansetron (ZOFRAN) 4 MG tablet Take 1 tablet (4 mg total) by mouth every 8 (eight) hours as needed for nausea or vomiting. 20 tablet 0  . pantoprazole (PROTONIX) 40 MG tablet Take 40 mg by mouth daily.    . rosuvastatin (CRESTOR) 20 MG tablet Take 1 tablet (20 mg total) by mouth daily at 6 PM. 30 tablet 0  . sacubitril-valsartan (ENTRESTO) 24-26 MG Take 1 tablet by mouth 2 (two) times daily. 60 tablet 0  .  topiramate (TOPAMAX) 100 MG tablet Take 200 mg by mouth at bedtime.     . VENTOLIN HFA 108 (90 BASE) MCG/ACT inhaler Use as directed 2 puffs in the mouth or throat 4 (four) times daily as needed for shortness of breath.   3  . carvedilol (COREG) 3.125 MG tablet Take 2 tablets (6.25 mg total) by mouth 2 (two) times daily with a meal. 60 tablet 1  . TRUEPLUS LANCETS 33G MISC USE TO CHECK BLOOD GLUCOSE QID - AFTER MEALS AND AT BEDTIME  3   No facility-administered medications prior to visit.     Radiology:   No results found. CT Code Stroke CTA Head W/WO contrast 03/27/2020 1. Negative for large vessel occlusion but positive for Right MCA M3 occlusion. CT Perfusion detects core infarct with minimally larger penumbra at the right insula/operculum corresponding to the plain CT finding.  2. Additionally, mild vessel irregularity is noted in multiple other circle-of-Willis branches, including the left A1, right P2, left M2. Although nonspecific this constellation of clinical and imaging findings might indicate accelerated branch vessel atherosclerosis. Although other large vessels appear normal, with no atherosclerosis in the neck or at the aortic arch.  MR ANGIO HEAD WO CONTRAST 03/27/2020 1. Moderate-sized acute right MCA infarct. 2. Small subacute left parieto-occipital infarcts. 3. Right M3 branch occlusion, otherwise negative head [id number]  CT ABDOMEN PELVIS W CONTRAST 03/27/2020 1. New 18 mm hypoattenuating lesion upper pole right kidney with areas of subcapsular multifocal decreased enhancement in the lower pole  right kidney. Imaging features are nonspecific and could be related to pyelonephritis and phlegmon/evolving abscess in the upper pole right kidney cannot be excluded. Alternatively, multifocal right renal infarct could have this appearance. Correlation with urinalysis may prove helpful.  2. Relatively well-defined 2.2 cm lesion in the inferior lingula measures water density. This may  be loculated pleural fluid or intraparenchymal fluid collection in this patient with a history of pulmonary embolus and bilateral ground-glass airspace disease on the previous study suspicious for multifocal atypical/viral pneumonia. Consider follow-up to ensure resolution.  Cardiac Studies:   ECHOCARDIOGRAM COMPLETE BUBBLE STUDY 03/27/2020 1. Left ventricular ejection fraction, by estimation, is 20 to 25%. The left ventricle has severely decreased function. There is severe hypokinesis of the basal-to-apical anteroseptum, inferoseptum and anterior LV walls . Left ventricular diastolic  parameters are consistent with Grade II diastolic dysfunction (pseudonormalization). 2. Right ventricular systolic function is normal. The right ventricular size is normal. 3. Left atrial size was moderately dilated. 4. The mitral valve is normal in structure. Trivial mitral valve regurgitation. 5. The aortic valve is tricuspid. There is mild thickening of the aortic valve. Aortic valve regurgitation is not visualized. 6. The inferior vena cava is normal in size with greater than 50% respiratory variability, suggesting right atrial pressure of 3 mmHg. 7. Agitated saline contrast bubble study was negative, with no evidence of any interatrial shunt.  Comparison(s): Compared to prior echo in 06/2019, the LVEF is now severely reduced to 20-25% with severe hypokinesis of the inferoseptal, anteroseptal and anterior LV walls.  EKG:   EKG 04/18/2020: Sinus rhythm at a rate of 90 bpm. Left axis deviation. PRWP, cannot exclude anteroseptal infarct old. Non-specific T wave abnormality.   EKG 03/27/2020: Sinus tachycardia at rate of 102 bpm, normal axis, borderline criteria for LVH. Borderline QT elongation. Compared to 06/30/2019, sinus tachycardia new. No change in QT elongation.  Assessment     ICD-10-CM   1. History of CVA with residual deficit  I69.30 LONG TERM MONITOR (3-14 DAYS)  2. Dilated cardiomyopathy  (HCC)  I42.0 carvedilol (COREG) 25 MG tablet    Basic metabolic panel  3. Chronic systolic heart failure (HCC)  I50.22 carvedilol (COREG) 25 MG tablet    Basic metabolic panel     Medications Discontinued During This Encounter  Medication Reason  . carvedilol (COREG) 3.125 MG tablet     Meds ordered this encounter  Medications  . carvedilol (COREG) 25 MG tablet    Sig: Take 0.5 tablets (12.5 mg total) by mouth 2 (two) times daily with a meal.    Dispense:  60 tablet    Refill:  3    Recommendations:   Shelly Shields is a 39 y.o. African-American female with hypertension, hyperlipidemia, uncontrolled diabetes mellitus, obesity and obstructive sleep apnea on CPAP and history of Covid pneumonia in January 2021 and also had pulmonary embolism during Covid infection.  She is also being evaluated for vasculitis. 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 Cardiomyopathy etiology likely stress cardiomyopathy vs significant coronary artery disease vs small PFO noted on TEE vs atrial fibrillation.   Patient presents for 2-week follow-up of heart failure and medication titration.  She is presently doing well without clinical signs of acute decompensated heart failure.  She has tolerated increased dose of carvedilol without adverse effects.  Reviewed and discussed recent lab results.  BMP revealed mild elevation in serum creatinine.  Patient reports she only  drinks 1-2 glasses of water per day.  Encouraged her to increase fluid intake.  Will recheck BMP in 1 week.  Would like to increase Entresto dose in the future, however in view of recent mild elevation in creatinine we will hold off on up titration of Entresto at this time.  Patient's heart rate continues to be >70 bpm, will increase carvedilol from 6.25 mg to 12.5 mg twice daily for 1 week.  Patient will monitor blood pressure and heart rate at home.   After 1 week if patient is tolerating increased dose of carvedilol and heart rate is >70 bpm, and patient will increase to 25 mg of carvedilol twice daily.  Patient will need ischemic evaluation, however at this time will first titrate heart failure medications.  Will obtain 2-week Zio patch monitoring in order to evaluate for atrial fibrillation as potential underlying etiology of recent CVA. Patient's hypercoagulabilty workup is negative, however will continue Eliquis due to recurrent thromboembolic events. Will consider repeat echocardiogram likely in 2-3 months.   Follow-up in 2 weeks for heart failure and medication titration.   Alethia Berthold, PA-C 05/23/2020, 9:29 AM Office: [phone]

## 2020-05-11 ENCOUNTER — Other Ambulatory Visit: Payer: Self-pay

## 2020-05-11 ENCOUNTER — Ambulatory Visit: Payer: 59 | Admitting: Student

## 2020-05-11 ENCOUNTER — Inpatient Hospital Stay: Payer: 59

## 2020-05-11 ENCOUNTER — Encounter: Payer: Self-pay | Admitting: Student

## 2020-05-11 VITALS — BP 124/84 | HR 100 | Ht 61.0 in | Wt 222.0 lb

## 2020-05-11 DIAGNOSIS — I5022 Chronic systolic (congestive) heart failure: Secondary | ICD-10-CM

## 2020-05-11 DIAGNOSIS — I420 Dilated cardiomyopathy: Secondary | ICD-10-CM

## 2020-05-11 DIAGNOSIS — I6930 Unspecified sequelae of cerebral infarction: Secondary | ICD-10-CM

## 2020-05-11 MED ORDER — CARVEDILOL 25 MG PO TABS: 12.5000 mg | ORAL_TABLET | Freq: Two times a day (BID) | ORAL | 3 refills | Status: DC

## 2020-05-11 NOTE — Patient Instructions (Signed)
Please take carvedilol 12.5 mg twice daily for 1 week. If tolerating without symptoms and heart rate >70 beats per minute, then increase carvedilol to 25 mg twice daily.   Repeat blood work at [facility] in 1 week.   Follow up appointment in 2 weeks.   Drink more water.

## 2020-05-12 ENCOUNTER — Ambulatory Visit: Payer: 59

## 2020-05-12 ENCOUNTER — Ambulatory Visit: Payer: 59 | Admitting: Occupational Therapy

## 2020-05-12 ENCOUNTER — Encounter: Payer: Self-pay | Admitting: Occupational Therapy

## 2020-05-12 DIAGNOSIS — R4701 Aphasia: Secondary | ICD-10-CM

## 2020-05-12 DIAGNOSIS — R278 Other lack of coordination: Secondary | ICD-10-CM

## 2020-05-12 DIAGNOSIS — R41842 Visuospatial deficit: Secondary | ICD-10-CM

## 2020-05-12 DIAGNOSIS — R2681 Unsteadiness on feet: Secondary | ICD-10-CM

## 2020-05-12 DIAGNOSIS — R471 Dysarthria and anarthria: Secondary | ICD-10-CM

## 2020-05-12 DIAGNOSIS — M6281 Muscle weakness (generalized): Secondary | ICD-10-CM | POA: Diagnosis not present

## 2020-05-12 DIAGNOSIS — R41841 Cognitive communication deficit: Secondary | ICD-10-CM

## 2020-05-12 DIAGNOSIS — I69354 Hemiplegia and hemiparesis following cerebral infarction affecting left non-dominant side: Secondary | ICD-10-CM

## 2020-05-12 NOTE — Therapy (Signed)
[facility] [address] [place], [name], [address] Phone: [phone]   Fax:  [phone]  Occupational Therapy Treatment  Patient Details  Name: Shelly Shields MRN: [id number] Date of Birth: 1980/07/18 Referring Provider (OT): [facility] - referred - will send reports to PCP per patient's request - Dr Salvadore Oxford   Encounter Date: 05/12/2020   OT End of Session - 05/12/20 1436    Visit Number 8    Number of Visits 17    Date for OT Re-Evaluation 06/25/20    Authorization Type 60 combined visits OT/PT/SLP [facility])    Authorization Time Period Week 4 of 8 (12/14)    OT Start Time 1435    OT Stop Time 1515    OT Time Calculation (min) 40 min    Activity Tolerance Patient tolerated treatment well    Behavior During Therapy [facility] for tasks assessed/performed;Flat affect           Past Medical History:  Diagnosis Date  . Asthma   . COVID-19 long hauler   . Diabetes mellitus without complication ([place])   . Hypertension   . Migraines   . Obesity   . Pulmonary embolism ([place]) 05/2019   with COVID  . Sleep apnea     Past Surgical History:  Procedure Laterality Date  . BILATERAL CARPAL TUNNEL RELEASE    . TEE WITHOUT CARDIOVERSION N/A 03/28/2020   Procedure: TRANSESOPHAGEAL ECHOCARDIOGRAM (TEE);  Surgeon: Adrian Prows, MD;  Location: [facility];  Service: Cardiovascular;  Laterality: N/A;    There were no vitals filed for this visit.   Subjective Assessment - 05/12/20 1437    Subjective  Pt reports shes just about done with christmas shopping. Denies any pain.    Pertinent History Covid, Migraines, DM, Sleep apnea    Currently in Pain? 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 No assistance was required for task      Fine Motor Coordination (Hand/Wrist)   Fine Motor Coordination Small Pegboard;Picking up coins;Manipulating coins;Stacking coins    Small Pegboard w LUE with min drops (maybe d/t longer nails) and increased time    Picking up coins -    Manipulating coins in hand translation from palm to fingertips x 5 coins to place in piggy bank    Stacking coins stacking coins in stacks of 5                    OT Short Term Goals - 05/04/20 1540      OT SHORT TERM GOAL #1   Title Patient will complete HEP designed to improve coordiantion in non dominant LUE    Time 4    Period Weeks    Status On-going    Target Date 05/26/20      OT SHORT TERM GOAL #2   Title  Patient will type three full sentences using two hands on keyboard in less than 5 minutes    Time 4    Period Weeks    Status On-going      OT SHORT TERM GOAL #3   Title Patient will navigate through empty hallway in familiar env't without hitting any obstacles placed in left field    Time 4    Period Weeks    Status On-going      OT SHORT TERM GOAL #4   Title Patient will prepare a simple, familiar hot dish using stove top or oven with minimal cueing    Time 4    Period Weeks    Status Achieved   met pt performed with supervision     OT SHORT TERM GOAL #5   Title Patient will demonstrate 3lb increase in grip strength in BUE's    Time 4    Period Weeks    Status On-going   met for RUE R 69.8 lbs, LUE 34.6 lbs            OT Long Term Goals - 04/11/20 1900      OT LONG TERM GOAL #1   Title Patient will complete an updated HEP to address overall upper body strength and conditioning    Time 8    Period Weeks    Status New     Target Date 06/25/20      OT LONG TERM GOAL #2   Title Patient will demonstrate a 5 lb increase in BUE strength to aide in opening packages and carrying items    Time 8    Period Weeks    Status New      OT LONG TERM GOAL #3   Title Patient will demonstrate a 5 sec reduction in time on 9 hole peg test to aide in coordiantion tasks as needed for work - filling prescriptions    Time 8    Period Weeks    Status New      OT LONG TERM GOAL #4   Title Patient will demonstrate understanding of recommendations relating to returning to driving    Time 8    Period Weeks    Status New      OT LONG TERM GOAL #5   Title Patient will demonstrate understanding of recommendations relating to return to work    Time 8    Period Weeks    Status New                 Plan - 05/12/20 1505    Clinical Impression Statement Pt demonstrating improvement with cooridnation and grip strength    OT Occupational Profile and History Detailed Assessment- Review of Records and additional review of physical, cognitive, psychosocial history related to current functional performance    Occupational performance deficits (Please refer to evaluation for details): ADL's;IADL's;Work;Leisure    Body Structure / Function / Physical Skills ADL;Coordination;Endurance;UE functional use;Balance;Decreased knowledge of use of DME;Flexibility;IADL;Vision;Dexterity;FMC;Strength    OT Frequency 2x / week    OT Duration 8 weeks    OT Treatment/Interventions Self-care/ADL training;Therapeutic exercise;Visual/perceptual remediation/compensation;Patient/family education;Neuromuscular education;Therapeutic activities;Balance training;DME and/or AE instruction;Cognitive remediation/compensation;Energy conservation;Functional Mobility Training    Plan environmental scanning w cog component, typing, work skills    Consulted and Agree with Plan of Care Patient           Patient will benefit from skilled therapeutic intervention  in order to improve the following deficits and impairments:   Body Structure /  Function / Physical Skills: ADL,Coordination,Endurance,UE functional use,Balance,Decreased knowledge of use of DME,Flexibility,IADL,Vision,Dexterity,FMC,Strength       Visit Diagnosis: Other lack of coordination  Visuospatial deficit  Muscle weakness (generalized)  Unsteadiness on feet  Hemiplegia and hemiparesis following cerebral infarction affecting left non-dominant side [facility])    Problem List Patient Active Problem List   Diagnosis Date Noted  . Acute CVA (cerebrovascular accident) ([place]) 03/27/2020  . Sleep apnea   . Diabetes mellitus without complication ([place])   . COVID-19 long hauler   . Obesity   . Acute pulmonary embolism ([place]) 06/30/2019  . Pulmonary embolism ([place]) 05/2019  . Hypernatremia   . Asthma, chronic   . DKA (diabetic ketoacidoses) 11/27/2014  . Diabetes mellitus, new onset ([place])   . Hyperglycemic hyperosmolar nonketotic coma ([place])   . Essential hypertension     Zachery Conch MOT, OTR/L  05/12/2020, 3:17 PM  [facility] [address] [place], [name], [address] Phone: [phone]   Fax:  [phone]  Name: Shelly Shields MRN: [id number] Date of Birth: 12/01/1980

## 2020-05-12 NOTE — Therapy (Signed)
[facility] [address] [place], [place], [address] Phone: [phone]   Fax:  [phone]  Speech Language Pathology Treatment  Patient Details  Name: ADALAIDE JASKOLSKI MRN: [id number] Date of Birth: Mar 18, 1981 Referring Provider (SLP): (ref) Dr. Ernest Mallick, (Documentation PCP) Arlan Organ, MD   Encounter Date: 05/12/2020   End of Session - 05/12/20 1700    Visit Number 2    Number of Visits 17    Date for SLP Re-Evaluation 08/07/20    Authorization - Number of Visits 60    SLP Start Time 1534    SLP Stop Time  1620    SLP Time Calculation (min) 46 min    Activity Tolerance Patient tolerated treatment well           Past Medical History:  Diagnosis Date  . Asthma   . COVID-19 long hauler   . Diabetes mellitus without complication (HCC)   . Hypertension   . Migraines   . Obesity   . Pulmonary embolism (HCC) 05/2019   with COVID  . Sleep apnea     Past Surgical History:  Procedure Laterality Date  . BILATERAL CARPAL TUNNEL RELEASE    . TEE WITHOUT CARDIOVERSION N/A 03/28/2020   Procedure: TRANSESOPHAGEAL ECHOCARDIOGRAM (TEE);  Surgeon: Yates Decamp, MD;  Location: [facility] ENDOSCOPY;  Service: Cardiovascular;  Laterality: N/A;    There were no vitals filed for this visit.   Subjective Assessment - 05/12/20 1535    Subjective "She worked with me on the arm bike and (hand coordination tasks)."    Currently in Pain? No/denies                 ADULT SLP TREATMENT - 05/12/20 1536      General Information   Behavior/Cognition Alert;Cooperative;Pleasant mood      Treatment Provided   Treatment provided Cognitive-Linquistic      Cognitive-Linquistic Treatment   Treatment focused on Cognition    Skilled Treatment SLP completed Cognitive Linguistic Quick Test (CLQT+) and pt scored WNL for all domains. She scored "mild" deficit with clock drawing which suggests some high level cognitive deficits.  SLP saw some impulsivity during the CLQT and so provided pt some simple yet detailed tasks and she demonstrted decr'd attention to detail on both tasks - 60% success. DEspite pt's WNL socre on domains, SLP suspects some attention and awareness deificts. SLP to add goals.      Assessment / Recommendations / Plan   Plan Goals updated      Progression Toward Goals   Progression toward goals Progressing toward goals            SLP Education - 05/12/20 1700    Education Details updating goals to reflect awaress and attention deficits    Person(s) Educated Patient    Methods Explanation    Comprehension Verbalized understanding;Need further instruction            SLP Short Term Goals - 05/12/20 1703      SLP SHORT TERM GOAL #1   Title pt will perform dysarhthria HEP with rare min x2 consecutive sessions    Time 4    Period Weeks   or 9 total sessions   Status On-going      SLP SHORT TERM GOAL #2   Title pt will complete aphasia testing PRN and goals added PRN    Time 2    Period Weeks    Status On-going      SLP  SHORT TERM GOAL #3   Title pt will undergo cognitive communication testing and goals added PRN    Status Achieved      SLP SHORT TERM GOAL #4   Title pt will use compensatory measures for speech intelligibilty of 95%+ in 10 minutes simple conversation x3 sessions    Time 4    Period Weeks    Status On-going      SLP SHORT TERM GOAL #5   Title pt will demo awareness of errors, given self correction, in simple cognitive linguistic tasks in 3 sessions    Time 4    Period Weeks    Status New      Additional Short Term Goals   Additional Short Term Goals Yes      SLP SHORT TERM GOAL #6   Title pt will demonstrate appropriate compensatory measures for decr'd attention skills in simple cognitve linguistic tasks    Time 4    Period Weeks    Status New            SLP Long Term Goals - 05/12/20 1706      SLP LONG TERM GOAL #1   Title pt will use  compensatory measures for speech intelligibilty of 95%+ in 10 minutes mod complex conversation, using self correction PRN x3 sessions    Time 8    Period Weeks   or 17 total sessions, for all LTGs   Status Revised      SLP LONG TERM GOAL #2   Title pt will use compensations for speech intelligiblity in 19/20 sentence responses resulting in 100% intelligbility in 3 sessions    Time 8    Period Weeks    Status On-going      SLP LONG TERM GOAL #3   Title pt will demonstrate appropriate anticipatory awareness in mod complex cognitive linguistic tasks by spontaneously self-correcting or double checking work 100% of the time in 3 sessions    Time 8    Period Weeks    Status New      SLP LONG TERM GOAL #4   Title pt will have 100% accuracy in simultaneous communicative tasks (simple divided attention) or make compensations for situations where she would have normally performed divided attention, in 3 sessions    Time 8    Period Weeks    Status New            Plan - 05/12/20 1700    Clinical Impression Statement 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 She also demonstrated decr'd attention to details and decr'd error awareness in simple detailed tasks, and with her speech intelligibility. Haywood Lasso, when questioned by SLP, stated she thought she may have had "a little" attention difference/deficit prior to CVA, but she never got this assessed to confirm or deny diagnosis. She reports anomia "A few times a day" - testing for anomia to occur in teh first 2 weeks of therapy. 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 Skilled ST is necessary for pt to improve speech  clarity and cognition, as well as any lingering expressive aphasia.    Speech Therapy Frequency 2x / week    Duration 8 weeks   or 17 total  sessions   Treatment/Interventions Language facilitation;Cueing hierarchy;Cognitive reorganization;Internal/external aids;Patient/family education;Compensatory strategies;SLP instruction and feedback;Multimodal communcation approach;Functional tasks;Oral motor exercises    Potential to Achieve Goals Good    Consulted and Agree with Plan of Care Patient           Patient will benefit from skilled therapeutic intervention in order to improve the following deficits and impairments:   Dysarthria and anarthria  Cognitive communication deficit  Aphasia    Problem List Patient Active Problem List   Diagnosis Date Noted  . Acute CVA (cerebrovascular accident) (HCC) 03/27/2020  . Sleep apnea   . Diabetes mellitus without complication (HCC)   . COVID-19 long hauler   . Obesity   . Acute pulmonary embolism (HCC) 06/30/2019  . Pulmonary embolism (HCC) 05/2019  . Hypernatremia   . Asthma, chronic   . DKA (diabetic ketoacidoses) 11/27/2014  . Diabetes mellitus, new onset (HCC)   . Hyperglycemic hyperosmolar nonketotic coma (HCC)   . Essential hypertension     , ,MS, CCC-SLP  05/12/2020, 5:11 PM  [facility] [address] [place], [place], [address] Phone: [phone]   Fax:  [phone]   Name: AMABEL STMARIE MRN: [id number] Date of Birth: 1980/10/21

## 2020-05-15 ENCOUNTER — Ambulatory Visit: Payer: 59 | Admitting: Occupational Therapy

## 2020-05-15 ENCOUNTER — Encounter: Payer: Self-pay | Admitting: Occupational Therapy

## 2020-05-15 ENCOUNTER — Other Ambulatory Visit: Payer: Self-pay

## 2020-05-15 DIAGNOSIS — R2681 Unsteadiness on feet: Secondary | ICD-10-CM

## 2020-05-15 DIAGNOSIS — M6281 Muscle weakness (generalized): Secondary | ICD-10-CM | POA: Diagnosis not present

## 2020-05-15 DIAGNOSIS — I69354 Hemiplegia and hemiparesis following cerebral infarction affecting left non-dominant side: Secondary | ICD-10-CM

## 2020-05-15 DIAGNOSIS — R278 Other lack of coordination: Secondary | ICD-10-CM

## 2020-05-15 DIAGNOSIS — R41842 Visuospatial deficit: Secondary | ICD-10-CM

## 2020-05-15 NOTE — Therapy (Signed)
[facility] [address] [place], [place], [address] Phone: [phone]   Fax:  [phone]  Occupational Therapy Treatment  Patient Details  Name: Shelly Shields MRN: [id number] Date of Birth: 1980-12-04 Referring Provider (OT): [facility] [place] - referred - will send reports to PCP per patient's request - Dr Arlan Organ   Encounter Date: 05/15/2020   OT End of Session - 05/15/20 1317    Visit Number 9    Number of Visits 17    Date for OT Re-Evaluation 06/25/20    Authorization Type 60 combined visits OT/PT/SLP [facility])    Authorization Time Period Week 5 of 8 (12/20)    OT Start Time 1316    OT Stop Time 1400    OT Time Calculation (min) 44 min    Activity Tolerance Patient tolerated treatment well    Behavior During Therapy [facility] for tasks assessed/performed;Flat affect           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 hauler    Diabetes mellitus without complication (HCC)    Hypertension    Migraines    Obesity    Pulmonary embolism (HCC) 05/2019   with COVID   Sleep apnea     Past Surgical History:  Procedure Laterality Date   BILATERAL CARPAL TUNNEL RELEASE     TEE WITHOUT CARDIOVERSION N/A 03/28/2020   Procedure: TRANSESOPHAGEAL ECHOCARDIOGRAM (TEE);  Surgeon: Yates Decamp, MD;  [facility] ENDOSCOPY;  Service: Cardiovascular;  Laterality: N/A;    There were no vitals filed for this visit.   Subjective Assessment - 05/15/20 1317    Subjective  Pt denies any pain and reports no changes but has finished the christmas shopping!    Pertinent History Covid, Migraines, DM, Sleep apnea    Currently in Pain? No/denies             Treatment:  Typing Test - previous on 04/25/20 pt completed the typing test with 13 wpm, 46% accuracy. Pt completed today with 75% accuracy and 18 wpm.   Typed 3 complete sentences in 4 min and 50 seconds with 4-5 errors. Pt able to  self-correct.   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 Pt with min to no drops with small beads. Pt removed with in hand manipulation of 6 beads in palm and translation of one into fingertips to sort into correct bins. PT with mod drops with in hand manipulation.                 OT Short Term Goals - 05/15/20 1318      OT SHORT TERM GOAL #1   Title Patient will complete HEP designed to improve coordiantion in non dominant LUE    Time 4    Period Weeks    Status Achieved    Target Date 05/26/20      OT SHORT TERM GOAL #2   Title Patient will type three full sentences using two hands on keyboard in less than 5 minutes    Time 4    Period Weeks    Status On-going   Completed 3 sentences in 4 min 50 sec with 4-5 errors.     OT SHORT TERM GOAL #3   Title Patient will navigate through  empty hallway in familiar env't without hitting any obstacles placed in left field    Time 4    Period Weeks    Status Achieved      OT SHORT TERM GOAL #4   Title Patient will prepare a simple, familiar hot dish using stove top or oven with minimal cueing    Time 4    Period Weeks    Status Achieved   met pt performed with supervision     OT SHORT TERM GOAL #5   Title Patient will demonstrate 3lb increase in grip strength in BUE's    Time 4    Period Weeks    Status Achieved   met for RUE R 69.8 lbs, LUE 41.6 lbs            OT Long Term Goals - 05/15/20 1322      OT LONG TERM GOAL #1   Title Patient will complete an updated HEP to address overall upper body strength and conditioning    Time 8    Period Weeks    Status New      OT LONG TERM GOAL #2   Title Patient will demonstrate a 5 lb increase in BUE strength to aide in opening packages and carrying items    Time 8    Period Weeks     Status On-going   LUE 41.6 RUE 69.8     OT LONG TERM GOAL #3   Title Patient will demonstrate a 5 sec reduction in time on 9 hole peg test to aide in coordiantion tasks as needed for work - filling prescriptions    Time 8    Period Weeks    Status New      OT LONG TERM GOAL #4   Title Patient will demonstrate understanding of recommendations relating to returning to driving    Time 8    Period Weeks    Status New      OT LONG TERM GOAL #5   Title Patient will demonstrate understanding of recommendations relating to return to work    Time [place] - 05/15/20 1341    Clinical Impression Statement Pt continues to make progress towards goals. Pt has met 4/5 STGs and is continuing to progress towards LTGs    OT Occupational Profile and History Detailed Assessment- Review of Records and additional review of physical, cognitive, psychosocial history related to current functional performance    Occupational performance deficits (Please refer to evaluation for details): ADL's;IADL's;Work;Leisure    Body Structure / Function / Physical Skills ADL;Coordination;Endurance;UE functional use;Balance;Decreased knowledge of use of DME;Flexibility;IADL;Vision;Dexterity;FMC;Strength    OT Frequency 2x / week    OT Duration 8 weeks    OT Treatment/Interventions Self-care/ADL training;Therapeutic exercise;Visual/perceptual remediation/compensation;Patient/family education;Neuromuscular education;Therapeutic activities;Balance training;DME and/or AE instruction;Cognitive remediation/compensation;Energy conservation;Functional Mobility Training    Plan Continue working towards typing accuracy and work Producer, television/film/video with Plan of Care Patient           Patient will benefit from skilled therapeutic intervention in order to improve the following deficits and impairments:   Body Structure / Function / Physical Skills:  ADL,Coordination,Endurance,UE functional use,Balance,Decreased knowledge of use of DME,Flexibility,IADL,Vision,Dexterity,FMC,Strength       Visit Diagnosis: Other lack of coordination  Visuospatial deficit  Muscle weakness (generalized)  Unsteadiness on feet  Hemiplegia  and hemiparesis following cerebral infarction affecting left non-dominant side [facility])    Problem List Patient Active Problem List   Diagnosis Date Noted   Acute CVA (cerebrovascular accident) ([place]) 03/27/2020   Sleep apnea    Diabetes mellitus without complication ([place])    [id number] long hauler    Obesity    Acute pulmonary embolism ([place]) 06/30/2019   Pulmonary embolism ([place]) 05/2019   Hypernatremia    Asthma, chronic    DKA (diabetic ketoacidoses) 11/27/2014   Diabetes mellitus, new onset ([place])    Hyperglycemic hyperosmolar nonketotic coma [facility])    Essential hypertension     Zachery Conch MOT, OTR/L  05/15/2020, 1:59 PM  [facility] [address] [place], [name], [address] Phone: [phone]   Fax:  [phone]  Name: Shelly Shields MRN: [id number] Date of Birth: [birth year]

## 2020-05-17 ENCOUNTER — Other Ambulatory Visit: Payer: Self-pay

## 2020-05-17 ENCOUNTER — Ambulatory Visit: Payer: 59

## 2020-05-17 ENCOUNTER — Ambulatory Visit: Payer: 59 | Admitting: Occupational Therapy

## 2020-05-17 VITALS — BP 125/87 | HR 101

## 2020-05-17 DIAGNOSIS — R4701 Aphasia: Secondary | ICD-10-CM

## 2020-05-17 DIAGNOSIS — R41841 Cognitive communication deficit: Secondary | ICD-10-CM

## 2020-05-17 DIAGNOSIS — M6281 Muscle weakness (generalized): Secondary | ICD-10-CM

## 2020-05-17 DIAGNOSIS — R471 Dysarthria and anarthria: Secondary | ICD-10-CM

## 2020-05-17 DIAGNOSIS — R2681 Unsteadiness on feet: Secondary | ICD-10-CM

## 2020-05-17 DIAGNOSIS — I69354 Hemiplegia and hemiparesis following cerebral infarction affecting left non-dominant side: Secondary | ICD-10-CM

## 2020-05-17 DIAGNOSIS — R2689 Other abnormalities of gait and mobility: Secondary | ICD-10-CM

## 2020-05-17 DIAGNOSIS — R41842 Visuospatial deficit: Secondary | ICD-10-CM

## 2020-05-17 DIAGNOSIS — R278 Other lack of coordination: Secondary | ICD-10-CM

## 2020-05-17 NOTE — Therapy (Signed)
[facility] [address] [place], [place], [address] Phone: [phone]   Fax:  [phone]  Speech Language Pathology Treatment  Patient Details  Name: Shelly Shields MRN: [id number] Date of Birth: Feb 06, 1981 Referring Provider (SLP): (ref) Dr. Ernest Mallick, (Documentation PCP) Arlan Organ, MD   Encounter Date: 05/17/2020   End of Session - 05/17/20 1146    Visit Number 3    Number of Visits 17    Date for SLP Re-Evaluation 08/07/20    Authorization - Visit Number 3    Authorization - Number of Visits 60    SLP Start Time 1020    SLP Stop Time  1101    SLP Time Calculation (min) 41 min    Activity Tolerance Patient tolerated treatment well           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 hauler    Diabetes mellitus without complication (HCC)    Hypertension    Migraines    Obesity    Pulmonary embolism (HCC) 05/2019   with COVID   Sleep apnea     Past Surgical History:  Procedure Laterality Date   BILATERAL CARPAL TUNNEL RELEASE     TEE WITHOUT CARDIOVERSION N/A 03/28/2020   Procedure: TRANSESOPHAGEAL ECHOCARDIOGRAM (TEE);  Surgeon: Yates Decamp, MD;  Location: [facility] ENDOSCOPY;  Service: Cardiovascular;  Laterality: N/A;    There were no vitals filed for this visit.   Subjective Assessment - 05/17/20 1029    Subjective Pt only filled out 2 of 3 pages of Speech QOL measure.    Currently in Pain? No/denies                 ADULT SLP TREATMENT - 05/17/20 1031      General Information   Behavior/Cognition Alert;Cooperative;Pleasant mood      Treatment Provided   Treatment provided Cognitive-Linquistic      Cognitive-Linquistic Treatment   Treatment focused on Cognition;Dysarthria    Skilled Treatment (Cognitive tx - 20 minutes) Pt returned the Speech QOL measure today with a score of 95. Long term goal added for increased measure of QOL related to  speech. SLP had pt tally up her scores for QOL and she got a different answer all 4 times she added totals, mostly due to decr'd attention/impulsibity. SLP told pt that she may need to pay special attention to double checking all work because brain will want to speed up (impulsivity) and will miss things at this time. Pt acknowledged this. (Speech tx-individual) SLP guided pt through practice for fast speech rate by having her read conversational sentences - intitally pt read very roboctically and req'd cues for naturalness. She indicated to SLP it seemed like she was speaking too slowly when her rate sounded WNL to SLP and SLP told pt this. Told pt to read 1/2 of list once a day and the other half another time during the day.      Assessment / Recommendations / Plan   Plan Goals updated      Progression Toward Goals   Progression toward goals Progressing toward goals              SLP Short Term Goals - 05/17/20 1147      SLP SHORT TERM GOAL #1   Title pt will perform dysarhthria HEP with rare min x2 consecutive sessions    Time 3    Period Weeks   or 9 total sessions  Status On-going      SLP SHORT TERM GOAL #2   Title pt will complete aphasia testing PRN and goals added PRN    Time 1    Period Weeks    Status On-going      SLP SHORT TERM GOAL #3   Title pt will undergo cognitive communication testing and goals added PRN    Status Achieved      SLP SHORT TERM GOAL #4   Title pt will use compensatory measures for speech intelligibilty of 95%+ in 10 minutes simple conversation x3 sessions    Time 3    Period Weeks    Status On-going      SLP SHORT TERM GOAL #5   Title pt will demo awareness of errors, given self correction, in simple cognitive linguistic tasks in 3 sessions    Time 3    Period Weeks    Status On-going      SLP SHORT TERM GOAL #6   Title pt will demonstrate appropriate compensatory measures for decr'd attention skills in simple cognitve linguistic tasks     Time 3    Period Weeks    Status On-going            SLP Long Term Goals - 05/17/20 1034      SLP LONG TERM GOAL #1   Title pt will use compensatory measures for speech intelligibilty of 95%+ in 10 minutes mod complex conversation, using self correction PRN x3 sessions    Time 7    Period Weeks   or 17 total sessions, for all LTGs   Status On-going      SLP LONG TERM GOAL #2   Title pt will use compensations for speech intelligiblity in 19/20 sentence responses resulting in 100% intelligbility in 3 sessions    Time 7    Period Weeks    Status On-going      SLP LONG TERM GOAL #3   Title pt will demonstrate appropriate anticipatory awareness in mod complex cognitive linguistic tasks by spontaneously self-correcting or double checking work 100% of the time in 3 sessions    Time 7    Period Weeks    Status On-going      SLP LONG TERM GOAL #4   Title pt will have 100% accuracy in simultaneous communicative tasks (simple divided attention) or make compensations for situations where she would have normally performed divided attention, in 3 sessions    Time 7    Period Weeks    Status On-going      SLP LONG TERM GOAL #5   Title in her last 1-2 visits, pt will demonstrate improved QOL score (</= 80) on speech QOL measure    Time 7    Period Weeks    Status New            Plan - 05/17/20 1147    Clinical Impression Statement Lawsyn presents today with ataxic dysarthria c/b imprecise consonants, and cognitive communication deficits demonstrated by her performance on the clock drawing from CLQT+. She also demonstrated decr'd attention to details and decr'd error awareness in simple detailed tasks, and with her speech intelligibility. Haywood Lasso, when questioned by SLP, stated she thought she may have had "a little" attention difference/deficit prior to CVA, but she never got this assessed to confirm or deny diagnosis. She reports anomia "A few times a day" - testing for anomia to  occur in teh first 2 weeks of therapy. 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 Skilled ST is necessary for pt to improve speech clarity and cognition, as well as any lingering expressive aphasia.    Speech Therapy Frequency 2x / week    Duration 8 weeks   or 17 total sessions   Treatment/Interventions Language facilitation;Cueing hierarchy;Cognitive reorganization;Internal/external aids;Patient/family education;Compensatory strategies;SLP instruction and feedback;Multimodal communcation approach;Functional tasks;Oral motor exercises    Potential to Achieve Goals Good    Consulted and Agree with Plan of Care Patient           Patient will benefit from skilled therapeutic intervention in order to improve the following deficits and impairments:   Dysarthria and anarthria  Cognitive communication deficit  Aphasia    Problem List Patient Active Problem List   Diagnosis Date Noted   Acute CVA (cerebrovascular accident) (HCC) 03/27/2020   Sleep apnea    Diabetes mellitus without complication (HCC)    COVID-19 long hauler    Obesity    Acute pulmonary embolism (HCC) 06/30/2019   Pulmonary embolism (HCC) 05/2019   Hypernatremia    Asthma, chronic    DKA (diabetic ketoacidoses) 11/27/2014   Diabetes mellitus, new onset (HCC)    Hyperglycemic hyperosmolar nonketotic coma (HCC)    Essential hypertension     , ,MS, CCC-SLP  05/17/2020, 11:48 AM  [facility] [address] [place], [place], [address] Phone: [phone]   Fax:  [phone]   Name: ZAYNA TOSTE MRN: [id number] Date of Birth: Jan 25, 1981

## 2020-05-17 NOTE — Patient Instructions (Signed)
Access Code: [email or web address] URL: [email or web address] Date: 05/17/2020 Prepared by: Jethro Bastos  Exercises Sit to Stand without Arm Support - 1 x daily - 5 x weekly - 2 sets - 10 reps Standing Balance with Eyes Closed on Foam - 1 x daily - 5 x weekly - 1 sets - 3 reps - 30 hold Standing with Head Rotation - 1 x daily - 5 x weekly - 2 sets - 10 reps Standing with Head Nod - 1 x daily - 5 x weekly - 2 sets - 10 reps Heel rises with counter support - 1 x daily - 5 x weekly - 2 sets - 10 reps Standing Hip Extension with Counter Support - 1 x daily - 5 x weekly - 3 sets - 10 reps Standing Hip Abduction with Counter Support - 1 x daily - 5 x weekly - 2 sets - 10 reps Side Stepping with Resistance at Thighs - 1 x daily - 5 x weekly - 2 sets - 10 reps

## 2020-05-17 NOTE — Therapy (Signed)
Shelly Shields Phone: [phone]   Fax:  [phone]  Occupational Therapy Treatment  Patient Details  Name: Shelly Shields MRN: [id number] Date of Birth: 1980/08/13 Referring Provider (OT): [facility] - referred - will send reports to PCP per patient's request - Dr Salvadore Oxford   Encounter Date: 05/17/2020   OT End of Session - 05/17/20 1126    Visit Number 10    Number of Visits 17    Date for OT Re-Evaluation 06/25/20    Authorization Type 60 combined visits OT/PT/SLP [facility])    Authorization Time Period Week 5 of 8 (12/20)    OT Start Time 1103    OT Stop Time 1143    OT Time Calculation (min) 40 min    Activity Tolerance Patient tolerated treatment well    Behavior During Therapy [facility] for tasks assessed/performed;Flat affect           Past Medical History:  Diagnosis Date  . Asthma   . COVID-19 long hauler   . Diabetes mellitus without complication ([place])   . Hypertension   . Migraines   . Obesity   . Pulmonary embolism ([place]) 05/2019   with COVID  . Sleep apnea     Past Surgical History:  Procedure Laterality Date  . BILATERAL CARPAL TUNNEL RELEASE    . TEE WITHOUT CARDIOVERSION N/A 03/28/2020   Procedure: TRANSESOPHAGEAL ECHOCARDIOGRAM (TEE);  Surgeon: Adrian Prows, MD;  Location: [facility];  Service: Cardiovascular;  Laterality: N/A;    There were no vitals filed for this visit.           Treatment:Typing test 18 wpm, 81 % accuracy Typing activities for speed and accuracy, "clouds" Stringing beads on golf tee for increased fine motor coordination. Simulated filling pill box with LUE in hand manipulation, min difficulty/ v.c Placing grooved pegs into pegboard with LUE then removing with tweezers.min difficulty/ v.c for increased fine motor coordination,              OT Education - 05/17/20 1227    Education Details red theraband  HEP for bilateral UE's    Person(s) Educated Patient    Methods Explanation;Demonstration;Handout;Verbal cues    Comprehension Verbalized understanding;Returned demonstration;Verbal cues required            OT Short Term Goals - 05/15/20 1318      OT SHORT TERM GOAL #1   Title Patient will complete HEP designed to improve coordiantion in non dominant LUE    Time 4    Period Weeks    Status Achieved    Target Date 05/26/20      OT SHORT TERM GOAL #2   Title Patient will type three full sentences using two hands on keyboard in less than 5 minutes    Time 4    Period Weeks    Status On-going   Completed 3 sentences in 4 min 50 sec with 4-5 errors.     OT SHORT TERM GOAL #3   Title Patient will navigate through empty hallway in familiar env't without hitting any obstacles placed in left field    Time 4    Period Weeks    Status Achieved      OT SHORT TERM GOAL #4   Title Patient will prepare a simple, familiar hot dish using stove top or oven with minimal cueing    Time 4    Period Weeks    Status Achieved  met pt performed with supervision     OT SHORT TERM GOAL #5   Title Patient will demonstrate 3lb increase in grip strength in BUE's    Time 4    Period Weeks    Status Achieved   met for RUE R 69.8 lbs, LUE 41.6 lbs            OT Long Term Goals - 05/17/20 1126      OT LONG TERM GOAL #1   Title Patient will complete an updated HEP to address overall upper body strength and conditioning    Time 8    Period Weeks    Status On-going      OT LONG TERM GOAL #2   Title Patient will demonstrate a 5 lb increase in BUE strength to aide in opening packages and carrying items    Time 8    Period Weeks    Status On-going   LUE 41.6 RUE 69.8     OT LONG TERM GOAL #3   Title Patient will demonstrate a 5 sec reduction in time on 9 hole peg test to aide in coordiantion tasks as needed for work - filling prescriptions    Time 8    Period Weeks    Status On-going       OT LONG TERM GOAL #4   Title Patient will demonstrate understanding of recommendations relating to returning to driving    Time 8    Period Weeks    Status On-going      OT LONG TERM GOAL #5   Title Patient will demonstrate understanding of recommendations relating to return to work    Time 8    Period Weeks    Status On-going                  Patient will benefit from skilled therapeutic intervention in order to improve the following deficits and impairments:           Visit Diagnosis: Other lack of coordination  Visuospatial deficit  Muscle weakness (generalized)  Unsteadiness on feet  Hemiplegia and hemiparesis following cerebral infarction affecting left non-dominant side [facility])    Problem List Patient Active Problem List   Diagnosis Date Noted  . Acute CVA (cerebrovascular accident) ([place]) 03/27/2020  . Sleep apnea   . Diabetes mellitus without complication ([place])   . COVID-19 long hauler   . Obesity   . Acute pulmonary embolism ([place]) 06/30/2019  . Pulmonary embolism ([place]) 05/2019  . Hypernatremia   . Asthma, chronic   . DKA (diabetic ketoacidoses) 11/27/2014  . Diabetes mellitus, new onset ([place])   . Hyperglycemic hyperosmolar nonketotic coma ([place])   . Essential hypertension     , 05/17/2020, 12:34 PM  Shelly Shields Phone: [phone]   Fax:  [phone]  Name: Shelly Shields

## 2020-05-17 NOTE — Therapy (Signed)
[facility] [address] [place], [name], [address] Phone: [phone]   Fax:  [phone]  Physical Therapy Treatment  Patient Details  Name: Shelly Shields MRN: [id number] Date of Birth: Oct 11, 1980 Referring Provider (PT): Phillips Climes referred (hospitalist) but PCP Salvadore Oxford   Encounter Date: 05/17/2020   PT End of Session - 05/17/20 1234    Visit Number 6    Number of Visits 9    Authorization Type [place]    PT Start Time 1232    PT Stop Time 9381    PT Time Calculation (min) 41 min    Equipment Utilized During Treatment Gait belt    Activity Tolerance Patient tolerated treatment well    Behavior During Therapy [facility] for tasks assessed/performed;Flat affect           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 hauler    Diabetes mellitus without complication ([place])    Hypertension    Migraines    Obesity    Pulmonary embolism ([place]) 05/2019   with COVID   Sleep apnea     Past Surgical History:  Procedure Laterality Date   BILATERAL CARPAL TUNNEL RELEASE     TEE WITHOUT CARDIOVERSION N/A 03/28/2020   Procedure: TRANSESOPHAGEAL ECHOCARDIOGRAM (TEE);  Surgeon: Adrian Prows, MD;  Location: [facility] ENDOSCOPY;  Service: Cardiovascular;  Laterality: N/A;    Vitals:   05/17/20 1250  BP: 125/87  Pulse: (!) 101     Subjective Assessment - 05/17/20 1235    Subjective Patient had busy morning with OT/ST. Patient reports that she has been trying to increase water intake.    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 PMH HTN HLD DM2 obese OSA on cpap COVID with pulmonary embolism vaculitis    Limitations Lifting;House hold activities    Currently in Pain? No/denies    Pain Onset More than a month ago                              [facility] Adult PT Treatment/Exercise - 05/17/20 0001      Transfers   Transfers Sit to Stand;Stand to Sit    Sit to Stand 5: Supervision    Stand to Sit 5: Supervision    Number of Reps 10 reps;2 sets    Comments completed sit <> stand with BLE placed on red balance, completed 2 x 10 reps. intermittent touching to chair for stability required and CGA from PT.      Ambulation/Gait   Ambulation/Gait Yes    Ambulation/Gait Assistance 5: Supervision    Ambulation/Gait Assistance Details completed ambulation on treadmill, along with ambulation throughout therapy gym with session.    Assistive device None    Gait Pattern Step-through pattern;Narrow base of support;Scissoring    Ambulation Surface Level;Indoor    Gait Comments completed gait/endurance training on treadmill. Patient ambulating x 2 minutes on 1.2 mph followed by followed by 1 minute rest break. then completed x 3 minutes. Attempted 1.5 mph, but patient reports too fast speed, decreased to 1.2.With increased fatigue use of BUE on treadmill bars needed.      Exercises   Exercises Knee/Hip    Other Exercises  Completed sidestepping with red therband around thighs, completed 2 x 35' each direction.  Balance Exercises - 05/17/20 0001      Balance Exercises: Standing   SLS with Vectors Foam/compliant surface;Intermittent upper extremity assist;Limitations    SLS with Vectors Limitations on airex at bottom of stairs: completed alternating toe taps to 1st step x 10 reps bilaterally without UE support, progressed to completing alternating toe taps to 2nd step x 10 reps bilaterally initially completing wiht UE support and progressing to no UE support. CGA throughout    Tandem Gait Forward;Retro;2 reps;Limitations    Tandem Gait Limitations completed forward/retro tandem gait without UE support, completed 2 x 35'. increased CGA with retro.    Other Standing Exercises Comments intermittent rest breaks required throughout  completion, especially after retro tandem gait.          HEP Update:   Access Code: [email or web address] URL: [email or web address] Date: 05/17/2020 Prepared by: Baldomero Lamy  Exercises Sit to Stand without Arm Support - 1 x daily - 5 x weekly - 2 sets - 10 reps Standing Balance with Eyes Closed on Foam - 1 x daily - 5 x weekly - 1 sets - 3 reps - 30 hold Standing with Head Rotation - 1 x daily - 5 x weekly - 2 sets - 10 reps Standing with Head Nod - 1 x daily - 5 x weekly - 2 sets - 10 reps Heel rises with counter support - 1 x daily - 5 x weekly - 2 sets - 10 reps Standing Hip Extension with Counter Support - 1 x daily - 5 x weekly - 3 sets - 10 reps Standing Hip Abduction with Counter Support - 1 x daily - 5 x weekly - 2 sets - 10 reps Side Stepping with Resistance at Thighs - 1 x daily - 5 x weekly - 2 sets - 10 reps    PT Education - 05/17/20 1318    Education Details HEP Update (see bolded additions)    Person(s) Educated Patient    Methods Explanation;Demonstration;Handout    Comprehension Returned demonstration;Verbalized understanding            PT Short Term Goals - 05/09/20 1152      PT SHORT TERM GOAL #1   Title Pt will be independent with initial HEP.    Baseline reports independence    Time 4    Period Weeks    Status Achieved    Target Date 05/09/20      PT SHORT TERM GOAL #2   Title Pt will increase FGA to >/= 25/30 for improved functional balance.    Baseline 21/30 04/11/20; 24/30 on 12/14    Time 4    Period Weeks    Status On-going    Target Date 05/09/20      PT SHORT TERM GOAL #3   Title Pt will improve gait speed to 0.7 m/s for improved access to community.    Baseline 0.6 m/s 04/11/20; 1.07 m/s    Time 4    Period Weeks    Status Achieved    Target Date 05/09/20      PT SHORT TERM GOAL #4   Title Pt will be able to ambulate 500+ feet with <4 RPE with no AD and supervsion.    Baseline 580 ft no AD supervision, RPE 7/10    Time 4     Period Weeks    Status Partially Met    Target Date 05/09/20             PT Long Term Goals - 04/19/20  [place] #1   Title Pt will be independent with progressive HEP.    Baseline NA    Time 8    Period Weeks    Status New      PT LONG TERM GOAL #2   Title Pt will increase FOTO from 68 to >/= 80.    Baseline 68 04/11/20    Time 8    Period Weeks    Status New      PT LONG TERM GOAL #3   Title Pt will be able to ambulate 1,000+ feet with <4 RPE with no AD and supervsion.    Baseline NT    Time 8    Period Weeks    Status New      PT LONG TERM GOAL #4   Title Patient will be able to complete >/= 1,000 ft with 6MWT test to demonstrate improved endurance.    Baseline 846    Time 8    Period Weeks    Status Revised      PT LONG TERM GOAL #5   Title Pt will score >27 on FGA for improved dynamic balance.    Baseline 21/30 04/11/20    Time 8    Period Weeks    Status New                 Plan - 05/17/20 1245    Clinical Impression Statement Today's skilled PT session included continued endurance and BLE strengthening. Completed gait trianing on treadmill toady with increased fatigue noted with increased gait speed compared to ambulation on land. Intermittent seated rest breaks required throughout session. Will conitnue to progress toward all LTGs.    Personal Factors and Comorbidities Past/Current Experience;Comorbidity 3+    Comorbidities PMH: HTN HLD DM2 obese OSA on cpap COVID with pulmonary embolism vaculitis    Examination-Activity Limitations Squat;Stairs;Locomotion Level;Lift    Examination-Participation Restrictions Occupation;Community Activity    Stability/Clinical Decision Making Evolving/Moderate complexity    Rehab Potential Good    PT Frequency 1x / week   plus eval   PT Duration 8 weeks    PT Treatment/Interventions Gait training;Stair training;Functional mobility training;Therapeutic activities;Therapeutic exercise;Balance  training;Neuromuscular re-education;Patient/family education;ADLs/Self Care Home Management;Visual/perceptual remediation/compensation;Energy conservation;Vestibular;Manual techniques    PT Next Visit Plan How was HEP addition? Continue high level balance/strengthening. Scifit/Nustep for endurance. Complete gait training (land/treadmill) focused on improved distance    Consulted and Agree with Plan of Care Patient           Patient will benefit from skilled therapeutic intervention in order to improve the following deficits and impairments:  Abnormal gait,Obesity,Decreased strength,Decreased balance,Improper body mechanics,Impaired vision/preception,Impaired perceived functional ability,Difficulty walking,Decreased coordination,Cardiopulmonary status limiting activity,Decreased activity tolerance,Decreased endurance  Visit Diagnosis: Muscle weakness (generalized)  Unsteadiness on feet  Other abnormalities of gait and mobility  Hemiplegia and hemiparesis following cerebral infarction affecting left non-dominant side [facility])     Problem List Patient Active Problem List   Diagnosis Date Noted   Acute CVA (cerebrovascular accident) ([place]) 03/27/2020   Sleep apnea    Diabetes mellitus without complication ([place])    MVEHM-09 long hauler    Obesity    Acute pulmonary embolism ([place]) 06/30/2019   Pulmonary embolism ([place]) 05/2019   Hypernatremia    Asthma, chronic    DKA (diabetic ketoacidoses) 11/27/2014   Diabetes mellitus, new onset ([place])    Hyperglycemic hyperosmolar nonketotic coma ([place])    Essential hypertension     [facility]  Verdie Drown, PT, DPT 05/17/2020, 1:22 PM  [facility] [address] [place], [name], [address] Phone: [phone]   Fax:  [phone]  Name: KENEDI CILIA MRN: [id number] Date of Birth: 1980/09/23

## 2020-05-17 NOTE — Patient Instructions (Signed)
  Strengthening: Resisted Flexion   Hold tubing with _one____ arm(s) at side. Pull forward and up. Move shoulder through pain-free range of motion. Repeat __10__ times per set.  Do _1-2_ sessions per day , every other day   Strengthening: Resisted Extension   Hold tubing in _one____ hand(s), arm forward. Pull arm back, elbow straight. Repeat _10___ times per set. Do _1-2___ sessions per day, every other day.   Resisted Horizontal Abduction: Bilateral   Sit or stand, tubing in both hands, arms out in front. Keeping arms straight, pinch shoulder blades together and stretch arms out. Repeat _10___ times per set. Do _1-2___ sessions per day, every other day.   Elbow Flexion: Resisted   With tubing held in __one____ hand(s) and other end secured under foot, curl arm up as far as possible. Repeat _10___ times per set. Do _1-2___ sessions per day, every other day.    Elbow Extension: Resisted   Sit in chair with resistive band secured at armrest (or hold with other hand) and __one _____ elbow bent. Straighten elbow. Repeat _10___ times per set.  Do _1-2___ sessions per day, every other day.   Copyright  VHI. All rights reserved.   

## 2020-05-22 ENCOUNTER — Ambulatory Visit: Payer: 59 | Admitting: Speech Pathology

## 2020-05-22 ENCOUNTER — Other Ambulatory Visit: Payer: Self-pay

## 2020-05-22 ENCOUNTER — Encounter: Payer: Self-pay | Admitting: Speech Pathology

## 2020-05-22 ENCOUNTER — Ambulatory Visit: Payer: 59 | Admitting: Occupational Therapy

## 2020-05-22 ENCOUNTER — Encounter: Payer: Self-pay | Admitting: Occupational Therapy

## 2020-05-22 DIAGNOSIS — R471 Dysarthria and anarthria: Secondary | ICD-10-CM

## 2020-05-22 DIAGNOSIS — R2681 Unsteadiness on feet: Secondary | ICD-10-CM

## 2020-05-22 DIAGNOSIS — R41841 Cognitive communication deficit: Secondary | ICD-10-CM

## 2020-05-22 DIAGNOSIS — M6281 Muscle weakness (generalized): Secondary | ICD-10-CM | POA: Diagnosis not present

## 2020-05-22 DIAGNOSIS — R41842 Visuospatial deficit: Secondary | ICD-10-CM

## 2020-05-22 DIAGNOSIS — R278 Other lack of coordination: Secondary | ICD-10-CM

## 2020-05-22 DIAGNOSIS — I69354 Hemiplegia and hemiparesis following cerebral infarction affecting left non-dominant side: Secondary | ICD-10-CM

## 2020-05-22 DIAGNOSIS — R4701 Aphasia: Secondary | ICD-10-CM

## 2020-05-22 NOTE — Therapy (Signed)
[facility] [address] [place], [place], [address] Phone: [phone]   Fax:  [phone]  Speech Language Pathology Treatment  Patient Details  Name: Shelly Shields MRN: [id number] Date of Birth: [birth year] Referring Provider (SLP): (ref) Dr. Ernest Mallick, (Documentation PCP) Arlan Organ, MD   Encounter Date: 05/22/2020   End of Session - 05/22/20 1424    Visit Number 4    Number of Visits 17    Date for SLP Re-Evaluation 08/07/20    Authorization - Visit Number 4    Authorization - Number of Visits 60    SLP Start Time 1232    SLP Stop Time  1313    SLP Time Calculation (min) 41 min    Activity Tolerance Patient tolerated treatment well           Past Medical History:  Diagnosis Date  . Asthma   . COVID-19 long hauler   . Diabetes mellitus without complication ([facility])   . Hypertension   . Migraines   . Obesity   . Pulmonary embolism ([facility]) 05/2019   with COVID  . Sleep apnea     Past Surgical History:  Procedure Laterality Date  . BILATERAL CARPAL TUNNEL RELEASE    . TEE WITHOUT CARDIOVERSION N/A 03/28/2020   Procedure: TRANSESOPHAGEAL ECHOCARDIOGRAM (TEE);  Surgeon: Yates Decamp, MD;  Location: [facility] ENDOSCOPY;  Service: Cardiovascular;  Laterality: N/A;    There were no vitals filed for this visit.   Subjective Assessment - 05/22/20 1235    Subjective "I had some sentences I had to go over"    Currently in Pain? No/denies                 ADULT SLP TREATMENT - 05/22/20 1237      General Information   Behavior/Cognition Alert;Cooperative;Pleasant mood      Treatment Provided   Treatment provided Cognitive-Linquistic      Cognitive-Linquistic Treatment   Treatment focused on Cognition;Dysarthria;Patient/family/caregiver education    Skilled Treatment Shelly Shields completed HEP for dysarthria with rare min A. She verbalized strategies for dysarthria with mod I. In structured speech  task, Shelly Shields utilized strategies for WNL speech with rare min A. In 7 conversation she carried over strategies for intelligibility with occasional min A. Shelly Shields endorsed that fatiuge and emotions affect her speech and intelligilbity. We discusses strategies to carryover strategies when she is emotional. She reports loosing items in her house, setting them down, then not finding them. We generated strategy of verbalizing when she puts an item down as compenstion for attention in the moment. Shelly Shields has not cooked consistently, mainly microwave. Generate strategies for attention to prepare simple meal (spaghetti and meat sauce) this week. Shelly Shields is to share thses with her family (see pt instructions)      Assessment / Recommendations / Plan   Plan Continue with current plan of care      Progression Toward Goals   Progression toward goals Progressing toward goals            SLP Education - 05/22/20 1422    Education Details compensations for attention and dysarthria    Person(s) Educated Patient    Methods Explanation;Demonstration;Verbal cues;Handout    Comprehension Returned demonstration;Verbal cues required;Need further instruction            SLP Short Term Goals - 05/22/20 1422      SLP SHORT TERM GOAL #1   Title pt will perform dysarhthria HEP with rare min x2 consecutive  sessions    Time 2    Period Weeks   or 9 total sessions   Status On-going      SLP SHORT TERM GOAL #2   Title pt will complete aphasia testing PRN and goals added PRN    Time 1    Period Weeks    Status On-going      SLP SHORT TERM GOAL #3   Title pt will undergo cognitive communication testing and goals added PRN    Status Achieved      SLP SHORT TERM GOAL #4   Title pt will use compensatory measures for speech intelligibilty of 95%+ in 10 minutes simple conversation x3 sessions    Time 2    Period Weeks    Status On-going      SLP SHORT TERM GOAL #5   Title pt will demo awareness of  errors, given self correction, in simple cognitive linguistic tasks in 3 sessions    Time 2    Period Weeks    Status On-going      SLP SHORT TERM GOAL #6   Title pt will demonstrate appropriate compensatory measures for decr'd attention skills in simple cognitve linguistic tasks    Time 2    Period Weeks    Status On-going            SLP Long Term Goals - 05/22/20 1423      SLP LONG TERM GOAL #1   Title pt will use compensatory measures for speech intelligibilty of 95%+ in 10 minutes mod complex conversation, using self correction PRN x3 sessions    Time 6    Period Weeks   or 17 total sessions, for all LTGs   Status On-going      SLP LONG TERM GOAL #2   Title pt will use compensations for speech intelligiblity in 19/20 sentence responses resulting in 100% intelligbility in 3 sessions    Time 6    Period Weeks    Status On-going      SLP LONG TERM GOAL #3   Title pt will demonstrate appropriate anticipatory awareness in mod complex cognitive linguistic tasks by spontaneously self-correcting or double checking work 100% of the time in 3 sessions    Time 6    Period Weeks    Status On-going      SLP LONG TERM GOAL #4   Title pt will have 100% accuracy in simultaneous communicative tasks (simple divided attention) or make compensations for situations where she would have normally performed divided attention, in 3 sessions    Time 6    Period Weeks    Status On-going      SLP LONG TERM GOAL #5   Title in her last 1-2 visits, pt will demonstrate improved QOL score (</= 80) on speech QOL measure    Time 6    Period Weeks    Status New            Plan - 05/22/20 1422    Clinical Impression Statement 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 She also demonstrated decr'd attention to details and decr'd error awareness in simple detailed  tasks, and with her speech intelligibility. Shelly Shields, when questioned by SLP, stated she thought she may have had "a little" attention difference/deficit prior to CVA, but she never got this assessed to confirm or deny diagnosis. She reports anomia "A few times a  day" - testing for anomia to occur in teh first 2 weeks of therapy. 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 Skilled ST is necessary for pt to improve speech clarity and cognition, as well as any lingering expressive aphasia.    Speech Therapy Frequency 2x / week    Duration 8 weeks   17 visits   Treatment/Interventions Language facilitation;Cueing hierarchy;Cognitive reorganization;Internal/external aids;Patient/family education;Compensatory strategies;SLP instruction and feedback;Multimodal communcation approach;Functional tasks;Oral motor exercises    Potential to Achieve Goals Good           Patient will benefit from skilled therapeutic intervention in order to improve the following deficits and impairments:   Dysarthria and anarthria  Cognitive communication deficit  Aphasia    Problem List Patient Active Problem List   Diagnosis Date Noted  . Acute CVA (cerebrovascular accident) (H[facility]) 03/27/2020  . Sleep apnea   . Diabetes mellitus without complication ([facility])   . COVID-19 long hauler   . Obesity   . Acute pulmonary embolism ([facility]) 06/30/2019  . Pulmonary embolism ([facility]) 05/2019  . Hypernatremia   . Asthma, chronic   . DKA (diabetic ketoacidoses) 11/27/2014  . Diabetes mellitus, new onset ([facility])   . Hyperglycemic hyperosmolar nonketotic coma (H[facility])   . Essential hypertension     , Shelly Journey MS, CCC-SLP  05/22/2020, 2:25 PM  [facility] [address] [place], [place], [address] Phone: [phone]   Fax:  [phone]   Name: Shelly Shields MRN: [id number] Date of Birth: 1980/09/07

## 2020-05-22 NOTE — Therapy (Signed)
[facility] [address] [place], [name], [address] Phone: [phone]   Fax:  [phone]  Occupational Therapy Treatment  Patient Details  Name: Shelly Shields MRN: [id number] Date of Birth: [birth year] Referring Provider (OT): [facility] - referred - will send reports to PCP per patient's request - Dr Salvadore Oxford   Encounter Date: 05/22/2020   OT End of Session - 05/22/20 1148    Visit Number 11    Number of Visits 17    Date for OT Re-Evaluation 06/25/20    Authorization Type 60 combined visits OT/PT/SLP [facility])    Authorization Time Period Week 6 of 8 (12/20)    OT Start Time 1148    OT Stop Time 1230    OT Time Calculation (min) 42 min    Activity Tolerance Patient tolerated treatment well    Behavior During Therapy [facility] for tasks assessed/performed;Flat affect           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 hauler    Diabetes mellitus without complication ([place])    Hypertension    Migraines    Obesity    Pulmonary embolism ([place]) 05/2019   with COVID   Sleep apnea     Past Surgical History:  Procedure Laterality Date   BILATERAL CARPAL TUNNEL RELEASE     TEE WITHOUT CARDIOVERSION N/A 03/28/2020   Procedure: TRANSESOPHAGEAL ECHOCARDIOGRAM (TEE);  Surgeon: Adrian Prows, MD;  Location: [place];  Service: Cardiovascular;  Laterality: N/A;    There were no vitals filed for this visit.   Subjective Assessment - 05/22/20 1149    Subjective  Pt denies any pain. Pt reports her travels and her weekend was good    Pertinent History Covid, Migraines, DM, Sleep apnea    Currently in Pain? No/denies                        OT Treatments/Exercises (OP) - 05/22/20 1152      Cognitive Exercises   Deductive Reasoning Logic puzzle with min assistance and increased time. Pt completed with 83% and was able to understand mistakes.       Visual/Perceptual Exercises   Copy this Image Other    Other IQFit - copying pattern with min verbal cues for novel task. Pt completed and solved 3 x beginner level patterns with moderate increased time and min/mod verbal cues for solving                    OT Short Term Goals - 05/15/20 1318      OT SHORT TERM GOAL #1   Title Patient will complete HEP designed to improve coordiantion in non dominant LUE    Time 4    Period Weeks    Status Achieved    Target Date 05/26/20      OT SHORT TERM GOAL #2   Title Patient will type three full sentences using two hands on keyboard in less than 5 minutes    Time 4    Period Weeks    Status On-going   Completed 3 sentences in 4 min 50 sec with 4-5 errors.     OT SHORT TERM GOAL #3   Title Patient will navigate through empty hallway in familiar env't without hitting any obstacles placed in left field    Time 4    Period Weeks    Status Achieved  OT SHORT TERM GOAL #4   Title Patient will prepare a simple, familiar hot dish using stove top or oven with minimal cueing    Time 4    Period Weeks    Status Achieved   met pt performed with supervision     OT SHORT TERM GOAL #5   Title Patient will demonstrate 3lb increase in grip strength in BUE's    Time 4    Period Weeks    Status Achieved   met for RUE R 69.8 lbs, LUE 41.6 lbs            OT Long Term Goals - 05/17/20 1126      OT LONG TERM GOAL #1   Title Patient will complete an updated HEP to address overall upper body strength and conditioning    Time 8    Period Weeks    Status On-going      OT LONG TERM GOAL #2   Title Patient will demonstrate a 5 lb increase in BUE strength to aide in opening packages and carrying items    Time 8    Period Weeks    Status On-going   LUE 41.6 RUE 69.8     OT LONG TERM GOAL #3   Title Patient will demonstrate a 5 sec reduction in time on 9 hole peg test to aide in coordiantion tasks as needed for work - filling  prescriptions    Time 8    Period Weeks    Status On-going      OT LONG TERM GOAL #4   Title Patient will demonstrate understanding of recommendations relating to returning to driving    Time 8    Period Weeks    Status On-going      OT LONG TERM GOAL #5   Title Patient will demonstrate understanding of recommendations relating to return to work    Time 8    Period Weeks    Status On-going                 Plan - 05/22/20 1204    Clinical Impression Statement Pt is continuing to progress toward her goals. Pt demonstrated some difficulty today with higher level spatial awareness and problem solving.    OT Occupational Profile and History Detailed Assessment- Review of Records and additional review of physical, cognitive, psychosocial history related to current functional performance    Occupational performance deficits (Please refer to evaluation for details): ADL's;IADL's;Work;Leisure    Body Structure / Function / Physical Skills ADL;Coordination;Endurance;UE functional use;Balance;Decreased knowledge of use of DME;Flexibility;IADL;Vision;Dexterity;FMC;Strength    Cognitive Skills Attention;Emotional;Energy/Drive;Memory;Problem Solve;Sequencing;Safety Awareness    Rehab Potential Good    Clinical Decision Making Several treatment options, min-mod task modification necessary    Modification or Assistance to Complete Evaluation  Min-Moderate modification of tasks or assist with assess necessary to complete eval    OT Frequency 2x / week    OT Duration 8 weeks    OT Treatment/Interventions Self-care/ADL training;Therapeutic exercise;Visual/perceptual remediation/compensation;Patient/family education;Neuromuscular education;Therapeutic activities;Balance training;DME and/or AE instruction;Cognitive remediation/compensation;Energy conservation;Functional Mobility Training    Plan review HEP, return to work skills    Consulted and Agree with Plan of Care Patient            Patient will benefit from skilled therapeutic intervention in order to improve the following deficits and impairments:   Body Structure / Function / Physical Skills: ADL,Coordination,Endurance,UE functional use,Balance,Decreased knowledge of use of DME,Flexibility,IADL,Vision,Dexterity,FMC,Strength Cognitive Skills: Attention,Emotional,Energy/Drive,Memory,Problem Solve,Sequencing,Safety Awareness  Visit Diagnosis: Visuospatial deficit  Other lack of coordination  Muscle weakness (generalized)  Unsteadiness on feet  Hemiplegia and hemiparesis following cerebral infarction affecting left non-dominant side [facility])    Problem List Patient Active Problem List   Diagnosis Date Noted   Acute CVA (cerebrovascular accident) ([place]) 03/27/2020   Sleep apnea    Diabetes mellitus without complication ([place])    [id number] long hauler    Obesity    Acute pulmonary embolism ([place]) 06/30/2019   Pulmonary embolism ([place]) 05/2019   Hypernatremia    Asthma, chronic    DKA (diabetic ketoacidoses) 11/27/2014   Diabetes mellitus, new onset ([place])    Hyperglycemic hyperosmolar nonketotic coma [facility])    Essential hypertension     Zachery Conch MOT, OTR/L  05/22/2020, 2:57 PM  [place] [facility] [address] [place], [name], [address] Phone: [phone]   Fax:  [phone]  Name: Shelly Shields MRN: [id number] Date of Birth: 01-08-1981

## 2020-05-22 NOTE — Patient Instructions (Addendum)
   To help others understand you:  Get the persons attention before you speak  Use eye contact and face the person you are speaking to  Be in close proximity to the person you are speaking to  Turn down any noise in the environment such as the TV, walk away from loud appliances, air conditioners, fans, dish washers etc  Be aware of how emotion and stress affect your speech and word finding - try to take some breaths to calm down and slow your speech    Tips to help facilitate better attention, concentration, focus   Do harder, longer tasks when you are most alert/awake  Break down larger tasks into small parts  Limit distractions of TV, radio, conversation, e mails/texts, appliance noise, etc - if a job is important, do it in a quiet room  Be aware of how you are functioning in high stimulation environments such as large stores, parties, restaurants - any place with lots of lights, noise, signs etc  Group conversations may be more difficult to process than one on one conversations  Give yourself extra time to process conversation, reading materials, directions or information from your healthcare providers  Organization is key - clutters of laundry, mail, paperwork, dirty dishes - all make it more difficult to concentrate  Before you start a task, have all the needed supplies, directions, recipes ready and organized. This way you don't have to go looking for something in the middle of a task and become distracted.   Be aware of fatigue - take rests or breaks when needed to re-group and re-focus  When you set something down, verbalize what you are doing. "I'm setting the towel on the chair" to help you pay attention and be mindful of what you are doing.  Try to cook a meal with supervision - the family should be there, but not distracting you by conversation. Eliminate distractions of TV, phone, radio etc  It's OK to feel "robotic" when you are making effort to slow you speech -  it is better to be understood

## 2020-05-23 LAB — BASIC METABOLIC PANEL
BUN/Creatinine Ratio: 12 (ref 9–23)
BUN: 11 mg/dL (ref 6–24)
CO2: 17 mmol/L — ABNORMAL LOW (ref 20–29)
Calcium: 8.8 mg/dL (ref 8.7–10.2)
Chloride: 111 mmol/L — ABNORMAL HIGH (ref 96–106)
Creatinine, Ser: 0.95 mg/dL (ref 0.57–1.00)
GFR calc Af Amer: 87 mL/min/{1.73_m2} (ref >59)
GFR calc non Af Amer: 75 mL/min/{1.73_m2} (ref >59)
Glucose: 251 mg/dL — ABNORMAL HIGH (ref 65–99)
Potassium: 3.6 mmol/L (ref 3.5–5.2)
Sodium: 141 mmol/L (ref 134–144)

## 2020-05-23 NOTE — Progress Notes (Signed)
Spoke to patient regarding her results she is aware

## 2020-05-23 NOTE — Progress Notes (Signed)
Please inform patient her renal function has improved to normal.

## 2020-05-24 ENCOUNTER — Ambulatory Visit: Payer: 59 | Admitting: Occupational Therapy

## 2020-05-24 ENCOUNTER — Ambulatory Visit: Payer: 59

## 2020-05-24 ENCOUNTER — Other Ambulatory Visit: Payer: Self-pay

## 2020-05-24 ENCOUNTER — Encounter: Payer: 59 | Admitting: Speech Pathology

## 2020-05-24 ENCOUNTER — Encounter: Payer: Self-pay | Admitting: Occupational Therapy

## 2020-05-24 VITALS — HR 66

## 2020-05-24 DIAGNOSIS — M6281 Muscle weakness (generalized): Secondary | ICD-10-CM

## 2020-05-24 DIAGNOSIS — R2689 Other abnormalities of gait and mobility: Secondary | ICD-10-CM

## 2020-05-24 DIAGNOSIS — R41842 Visuospatial deficit: Secondary | ICD-10-CM

## 2020-05-24 DIAGNOSIS — R278 Other lack of coordination: Secondary | ICD-10-CM

## 2020-05-24 DIAGNOSIS — I69354 Hemiplegia and hemiparesis following cerebral infarction affecting left non-dominant side: Secondary | ICD-10-CM

## 2020-05-24 NOTE — Progress Notes (Deleted)
Primary Physician/Referring:  [name], MD  Patient ID: Shelly Shields, female    DOB: March 19, 1981, 39 y.o.   MRN: [id number]  No chief complaint on file.  HPI:    Shelly Shields  is a 39 y.o. African-American female with hypertension, hyperlipidemia, uncontrolled diabetes mellitus, obesity and obstructive sleep apnea on CPAP and history of Covid pneumonia in January 2021 and also had pulmonary embolism during Covid infection.  She is also being evaluated for vasculitis. 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 Cardiomyopathy etiology likely stress cardiomyopathy vs significant coronary artery disease vs small PFO noted on TEE vs atrial fibrillation.   Patient presents for 2 week follow up of heart failure and medication titration.  At last visit increased carvedilol from 3.125 mg to 6.25 mg twice daily.  Patient continues to follow with neurology as well as with outpatient occupational and speech therapy, and physical therapy.  She is presently doing well without chest pain, palpitations, dyspnea, leg swelling, dizziness.   ***Patient presents for 2 week follow up of heart failure and medication titration. At last visit increased carvedilol from 6.25 mg to 12.5 mg BID for 1 week, and advised patient to increase to 25 mg BID if she tolerated 12.5 mg well. Patient is presently taking *** carvedilol twice daily.  Past Medical History:  Diagnosis Date  . Asthma   . COVID-19 long hauler   . Diabetes mellitus without complication ([place])   . Hypertension   . Migraines   . Obesity   . Pulmonary embolism ([place]) 05/2019   with COVID  . Sleep apnea    Past Surgical History:  Procedure Laterality Date  . BILATERAL CARPAL TUNNEL RELEASE    . TEE WITHOUT CARDIOVERSION N/A 03/28/2020   Procedure: TRANSESOPHAGEAL ECHOCARDIOGRAM (TEE);  Surgeon: Adrian Prows, MD;  Location: [facility] ENDOSCOPY;   Service: Cardiovascular;  Laterality: N/A;   Family History  Problem Relation Age of Onset  . Hypertension Mother   . Thyroid disease Mother   . Hypertension Father   . Stroke Father 92  . Diabetes Father   . Sleep apnea Father   . CVA Paternal Uncle 77    Social History   Tobacco Use  . Smoking status: Never Smoker  . Smokeless tobacco: Never Used  Substance Use Topics  . Alcohol use: Yes    Alcohol/week: 0.0 standard drinks    Comment: occasional    Marital Status: Single   ROS  Review of Systems  Constitutional: Negative for malaise/fatigue and weight gain.  Cardiovascular: Negative for chest pain, claudication, leg swelling, near-syncope, orthopnea, palpitations, paroxysmal nocturnal dyspnea and syncope.  Respiratory: Negative for shortness of breath.   Hematologic/Lymphatic: Does not bruise/bleed easily.  Gastrointestinal: Negative for melena.  Neurological: Negative for dizziness and weakness.    Objective  There were no vitals taken for this visit.  Vitals with BMI 05/17/2020 05/11/2020 04/27/2020  Height - $Remove'5\' 1"'dibeskb$  -  Weight - 222 lbs -  BMI - 17.79 -  Systolic 390 300 923  Diastolic 87 84 84  Pulse 300 100 73      Physical Exam Vitals reviewed.  Constitutional:      Appearance: She is obese.  HENT:     Head: Normocephalic and atraumatic.  Cardiovascular:     Rate and Rhythm: Normal rate and regular rhythm.     Pulses: Intact distal pulses.     Heart sounds:  S1 normal and S2 normal. No murmur heard. No gallop.      Comments: No leg edema.  No JVD.  Pulmonary:     Effort: Pulmonary effort is normal. No respiratory distress.     Breath sounds: No wheezing, rhonchi or rales.  Musculoskeletal:     Right lower leg: No edema.     Left lower leg: No edema.  Neurological:     Mental Status: She is alert.     Cranial Nerves: Cranial nerve deficit present.     Gait: Gait abnormal (Ambulates with cane ).     Laboratory examination:   Recent Labs     02/06/20 2118 03/27/20 0946 03/29/20 0229 05/01/20 1612 05/22/20 1624  NA 139   < > 139 144 141  K 4.0   < > 3.0* 4.0 3.6  CL 108   < > 109 110* 111*  CO2 21*   < > 20* 19* 17*  GLUCOSE 238*   < > 187* 189* 251*  BUN 14   < > $R'9 13 11  'bV$ CREATININE 1.13*   < > 0.81 1.03* 0.95  CALCIUM 9.0   < > 8.7* 9.1 8.8  GFRNONAA >60   < > >60 68 75  GFRAA >60  --   --  79 87   < > = values in this interval not displayed.   estimated creatinine clearance is 85.7 mL/min (by C-G formula based on SCr of 0.95 mg/dL).  CMP Latest Ref Rng & Units 05/22/2020 05/01/2020 03/29/2020  Glucose 65 - 99 mg/dL 251(H) 189(H) 187(H)  BUN 6 - 24 mg/dL $Remove'11 13 9  'hcQNBFw$ Creatinine 0.57 - 1.00 mg/dL 0.95 1.03(H) 0.81  Sodium 134 - 144 mmol/L 141 144 139  Potassium 3.5 - 5.2 mmol/L 3.6 4.0 3.0(L)  Chloride 96 - 106 mmol/L 111(H) 110(H) 109  CO2 20 - 29 mmol/L 17(L) 19(L) 20(L)  Calcium 8.7 - 10.2 mg/dL 8.8 9.1 8.7(L)  Total Protein 6.5 - 8.1 g/dL - - -  Total Bilirubin 0.3 - 1.2 mg/dL - - -  Alkaline Phos 38 - 126 U/L - - -  AST 15 - 41 U/L - - -  ALT 0 - 44 U/L - - -   CBC Latest Ref Rng & Units 03/29/2020 03/27/2020 03/27/2020  WBC 4.0 - 10.5 K/uL 10.4 - 11.7(H)  Hemoglobin 12.0 - 15.0 g/dL 12.7 12.9 12.8  Hematocrit 36.0 - 46.0 % 38.3 38.0 39.3  Platelets 150 - 400 K/uL 245 - 195    Lipid Panel Recent Labs    03/28/20 0415  CHOL 176  TRIG 106  LDLCALC 126*  VLDL 21  HDL 29*  CHOLHDL 6.1    HEMOGLOBIN A1C Lab Results  Component Value Date   HGBA1C 11.2 (H) 03/27/2020   MPG 274.74 03/27/2020   TSH Recent Labs    03/28/20 0415  TSH 3.896    External labs:  None   Medications and allergies   Allergies  Allergen Reactions  . Lisinopril Cough  . Metformin Diarrhea  . Ibuprofen Hives  . Ketorolac Hives     Outpatient Medications Prior to Visit  Medication Sig Dispense Refill  . apixaban (ELIQUIS) 5 MG TABS tablet Take 1 tablet (5 mg total) by mouth 2 (two) times daily. 60 tablet 0  .  Baclofen 5 MG TABS TAKE 1 TABLET BY MOUTH THREE TIMES DAILY AS NEEDED FOR HEADACHE OR NECK PAIN    . Blood Glucose Monitoring Suppl (ACCU-CHEK NANO SMARTVIEW) W/DEVICE KIT  1 Device by Does not apply route 4 (four) times daily - after meals and at bedtime. 1 kit 0  . butalbital-acetaminophen-caffeine (FIORICET) 50-325-40 MG tablet Take 1 tablet by mouth every 6 (six) hours as needed for headache. 20 tablet 0  . carvedilol (COREG) 25 MG tablet Take 0.5 tablets (12.5 mg total) by mouth 2 (two) times daily with a meal. 60 tablet 3  . escitalopram (LEXAPRO) 10 MG tablet Take 10 mg by mouth daily.    . fexofenadine (ALLEGRA) 180 MG tablet Take 180 mg by mouth daily as needed.     . fluticasone (FLONASE) 50 MCG/ACT nasal spray Place 2 sprays into both nostrils daily as needed for allergies.   3  . fluticasone (FLOVENT HFA) 110 MCG/ACT inhaler Inhale 1 puff into the lungs daily.     Marland Kitchen glucose blood (ACCU-CHEK SMARTVIEW) test strip Check sugar 6 x daily (Patient taking differently: Check sugar 6 x daily. Uses true test instead) 200 each 3  . insulin glargine (LANTUS) 100 UNIT/ML Solostar Pen Inject 80 Units into the skin 2 (two) times daily.    . insulin lispro protamine-lispro (HUMALOG 75/25 MIX) (75-25) 100 UNIT/ML SUSP injection Inject 50 Units into the skin 3 (three) times daily. Sliding scale    . Insulin Pen Needle (INSUPEN PEN NEEDLES) 32G X 4 MM MISC BD Pen Needles- brand specific. Inject insulin via insulin pen daily 200 each 3  . Lancet Devices (ACCU-CHEK SOFTCLIX) lancets Use as instructed for blood glucose checks four times daily, before meals and at bedtime 100 each 5  . medroxyPROGESTERone (DEPO-PROVERA) 150 MG/ML injection Inject 150 mg into the muscle every 3 (three) months.    . montelukast (SINGULAIR) 10 MG tablet Take 10 mg by mouth at bedtime.    . nortriptyline (PAMELOR) 10 MG capsule Take 10 mg by mouth at bedtime.    . ondansetron (ZOFRAN) 4 MG tablet Take 1 tablet (4 mg total) by mouth  every 8 (eight) hours as needed for nausea or vomiting. 20 tablet 0  . pantoprazole (PROTONIX) 40 MG tablet Take 40 mg by mouth daily.    . rosuvastatin (CRESTOR) 20 MG tablet Take 1 tablet (20 mg total) by mouth daily at 6 PM. 30 tablet 0  . sacubitril-valsartan (ENTRESTO) 24-26 MG Take 1 tablet by mouth 2 (two) times daily. 60 tablet 0  . topiramate (TOPAMAX) 100 MG tablet Take 200 mg by mouth at bedtime.     . TRUEPLUS LANCETS 33G MISC USE TO CHECK BLOOD GLUCOSE QID - AFTER MEALS AND AT BEDTIME  3  . VENTOLIN HFA 108 (90 BASE) MCG/ACT inhaler Use as directed 2 puffs in the mouth or throat 4 (four) times daily as needed for shortness of breath.   3   No facility-administered medications prior to visit.     Radiology:   No results found. CT Code Stroke CTA Head W/WO contrast 03/27/2020 1. Negative for large vessel occlusion but positive for Right MCA M3 occlusion. CT Perfusion detects core infarct with minimally larger penumbra at the right insula/operculum corresponding to the plain CT finding.  2. Additionally, mild vessel irregularity is noted in multiple other circle-of-Willis branches, including the left A1, right P2, left M2. Although nonspecific this constellation of clinical and imaging findings might indicate accelerated branch vessel atherosclerosis. Although other large vessels appear normal, with no atherosclerosis in the neck or at the aortic arch.  MR ANGIO HEAD WO CONTRAST 03/27/2020 1. Moderate-sized acute right MCA infarct. 2. Small subacute  left parieto-occipital infarcts. 3. Right M3 branch occlusion, otherwise negative head [id number]  CT ABDOMEN PELVIS W CONTRAST 03/27/2020 1. New 18 mm hypoattenuating lesion upper pole right kidney with areas of subcapsular multifocal decreased enhancement in the lower pole right kidney. Imaging features are nonspecific and could be related to pyelonephritis and phlegmon/evolving abscess in the upper pole right kidney cannot be  excluded. Alternatively, multifocal right renal infarct could have this appearance. Correlation with urinalysis may prove helpful.  2. Relatively well-defined 2.2 cm lesion in the inferior lingula measures water density. This may be loculated pleural fluid or intraparenchymal fluid collection in this patient with a history of pulmonary embolus and bilateral ground-glass airspace disease on the previous study suspicious for multifocal atypical/viral pneumonia. Consider follow-up to ensure resolution.  Cardiac Studies:   Cardiac monitor: pending   ECHOCARDIOGRAM COMPLETE BUBBLE STUDY 03/27/2020 1. Left ventricular ejection fraction, by estimation, is 20 to 25%. The left ventricle has severely decreased function. There is severe hypokinesis of the basal-to-apical anteroseptum, inferoseptum and anterior LV walls . Left ventricular diastolic  parameters are consistent with Grade II diastolic dysfunction (pseudonormalization). 2. Right ventricular systolic function is normal. The right ventricular size is normal. 3. Left atrial size was moderately dilated. 4. The mitral valve is normal in structure. Trivial mitral valve regurgitation. 5. The aortic valve is tricuspid. There is mild thickening of the aortic valve. Aortic valve regurgitation is not visualized. 6. The inferior vena cava is normal in size with greater than 50% respiratory variability, suggesting right atrial pressure of 3 mmHg. 7. Agitated saline contrast bubble study was negative, with no evidence of any interatrial shunt.  Comparison(s): Compared to prior echo in 06/2019, the LVEF is now severely reduced to 20-25% with severe hypokinesis of the inferoseptal, anteroseptal and anterior LV walls.  EKG:   EKG 04/18/2020: Sinus rhythm at a rate of 90 bpm. Left axis deviation. PRWP, cannot exclude anteroseptal infarct old. Non-specific T wave abnormality.   EKG 03/27/2020: Sinus tachycardia at rate of 102 bpm, normal axis, borderline  criteria for LVH. Borderline QT elongation. Compared to 06/30/2019, sinus tachycardia new. No change in QT elongation.  Assessment     ICD-10-CM   1. Dilated cardiomyopathy ([place])  I42.0   2. Chronic systolic heart failure (HCC)  I50.22   3. Essential hypertension  I10   4. History of CVA with residual deficit  I69.30      There are no discontinued medications.  No orders of the defined types were placed in this encounter.   Recommendations:   Shelly Shields is a 39 y.o. African-American female with hypertension, hyperlipidemia, uncontrolled diabetes mellitus, obesity and obstructive sleep apnea on CPAP and history of Covid pneumonia in January 2021 and also had pulmonary embolism during Covid infection.  She is also being evaluated for vasculitis. 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 Cardiomyopathy etiology likely stress cardiomyopathy vs significant coronary artery disease vs small PFO noted on TEE vs atrial fibrillation.   Patient presents for 2-week follow-up of heart failure and medication titration.  She is presently doing well without clinical signs of acute decompensated heart failure.  She has tolerated increased dose of carvedilol without adverse effects.  Reviewed and discussed recent lab results.  BMP revealed mild elevation in serum creatinine.  Patient reports she only drinks 1-2 glasses of water per day.  Encouraged her to increase fluid intake.  Will recheck BMP in 1  week.  Would like to increase Entresto dose in the future, however in view of recent mild elevation in creatinine we will hold off on up titration of Entresto at this time.  Patient's heart rate continues to be >70 bpm, will increase carvedilol from 6.25 mg to 12.5 mg twice daily for 1 week.  Patient will monitor blood pressure and heart rate at home.  After 1 week if patient is tolerating increased dose of  carvedilol and heart rate is >70 bpm, and patient will increase to 25 mg of carvedilol twice daily.  Patient will need ischemic evaluation, however at this time will first titrate heart failure medications.  Will obtain 2-week Zio patch monitoring in order to evaluate for atrial fibrillation as potential underlying etiology of recent CVA. Patient's hypercoagulabilty workup is negative, however will continue Eliquis due to recurrent thromboembolic events. Will consider repeat echocardiogram likely in 2-3 months.   Follow-up in 2 weeks for heart failure and medication titration.  ***

## 2020-05-24 NOTE — Therapy (Signed)
[facility] [address] [place], [name], [address] Phone: [phone]   Fax:  [phone]  Physical Therapy Treatment  Patient Details  Name: Shelly Shields MRN: [id number] Date of Birth: 1980/06/24 Referring Provider (PT): Phillips Climes referred (hospitalist) but PCP Salvadore Oxford   Encounter Date: 05/24/2020   PT End of Session - 05/24/20 1400    Visit Number 7    Number of Visits 9    Authorization Type [employer]    PT Start Time 1400    PT Stop Time 1442    PT Time Calculation (min) 42 min    Equipment Utilized During Treatment Gait belt    Activity Tolerance Patient tolerated treatment well    Behavior During Therapy [facility] for tasks assessed/performed;Flat affect           Past Medical History:  Diagnosis Date  . Asthma   . COVID-19 long hauler   . Diabetes mellitus without complication ([place])   . Hypertension   . Migraines   . Obesity   . Pulmonary embolism ([place]) 05/2019   with COVID  . Sleep apnea     Past Surgical History:  Procedure Laterality Date  . BILATERAL CARPAL TUNNEL RELEASE    . TEE WITHOUT CARDIOVERSION N/A 03/28/2020   Procedure: TRANSESOPHAGEAL ECHOCARDIOGRAM (TEE);  Surgeon: Adrian Prows, MD;  Location: [facility] ENDOSCOPY;  Service: Cardiovascular;  Laterality: N/A;    Vitals:   05/24/20 1405  Pulse: 66     Subjective Assessment - 05/24/20 1400    Subjective Pt reports she is doing well. Energy getting better each day. Was tired after treadmill last time but not in to the next day.    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 PMH HTN HLD DM2 obese OSA on cpap COVID with pulmonary embolism vaculitis    Limitations Lifting;House hold activities    Currently in Pain? No/denies    Pain Onset More than a month ago                              [facility] Adult PT Treatment/Exercise - 05/24/20 1403      Ambulation/Gait   Ambulation/Gait Yes    Ambulation/Gait Assistance 5: Supervision    Ambulation/Gait Assistance Details gait on treadmill as discussed below as well as around in clinic between activities.    Assistive device None    Gait Pattern Step-through pattern    Ambulation Surface Level;Indoor    Gait Comments Pt ambulated on treadmill in intervals: 3 min at 1.58mh then 3 min at 1.3 mph x 2. 2 min standing rest breaks betwen each bout. 6-7/10 RPE after each bout. HR=96 after      Neuro Re-ed    Neuro Re-ed Details  Standing at bottom of step on soft blue foam beam with alternating taps on bottom step 10 x 2 each leg with brief standing break in between. Gait over red mat reciprocal steps over 3 cones tapping first to increase SLS time x 6 bouts. As pt fatigued she became less stable. Dynamic gait in hallway 40' x 2: tandem gait with close SBA, backwards gait. Pt noted to have increased staggering as fatigued towards the end. Sit to stand from mat without hands on soft blue foam beam 5 x 2.  PT Education - 05/24/20 1454    Education Details Discussed adding on 2 more visits as were in initial plan and pt would like to continue a bit more.    Person(s) Educated Patient    Methods Explanation    Comprehension Verbalized understanding            PT Short Term Goals - 05/09/20 1152      PT SHORT TERM GOAL #1   Title Pt will be independent with initial HEP.    Baseline reports independence    Time 4    Period Weeks    Status Achieved    Target Date 05/09/20      PT SHORT TERM GOAL #2   Title Pt will increase FGA to >/= 25/30 for improved functional balance.    Baseline 21/30 04/11/20; 24/30 on 12/14    Time 4    Period Weeks    Status On-going    Target Date 05/09/20      PT SHORT TERM GOAL #3   Title Pt will improve gait speed to 0.7 m/s for improved  access to community.    Baseline 0.6 m/s 04/11/20; 1.07 m/s    Time 4    Period Weeks    Status Achieved    Target Date 05/09/20      PT SHORT TERM GOAL #4   Title Pt will be able to ambulate 500+ feet with <4 RPE with no AD and supervsion.    Baseline 580 ft no AD supervision, RPE 7/10    Time 4    Period Weeks    Status Partially Met    Target Date 05/09/20             PT Long Term Goals - 04/19/20 1553      PT LONG TERM GOAL #1   Title Pt will be independent with progressive HEP.    Baseline NA    Time 8    Period Weeks    Status New      PT LONG TERM GOAL #2   Title Pt will increase FOTO from 68 to >/= 80.    Baseline 68 04/11/20    Time 8    Period Weeks    Status New      PT LONG TERM GOAL #3   Title Pt will be able to ambulate 1,000+ feet with <4 RPE with no AD and supervsion.    Baseline NT    Time 8    Period Weeks    Status New      PT LONG TERM GOAL #4   Title Patient will be able to complete >/= 1,000 ft with 6MWT test to demonstrate improved endurance.    Baseline 846    Time 8    Period Weeks    Status Revised      PT LONG TERM GOAL #5   Title Pt will score >27 on FGA for improved dynamic balance.    Baseline 21/30 04/11/20    Time 8    Period Weeks    Status New                 Plan - 05/24/20 1454    Clinical Impression Statement PT continued to work on treadmill today with interval training. Pt able to increase total overall time with RPE 7/10 at highest. Did well with intermittent rest breaks throughout session. PT did note some decreased stability as she would fatigue towards end of activities.  Personal Factors and Comorbidities Past/Current Experience;Comorbidity 3+    Comorbidities PMH: HTN HLD DM2 obese OSA on cpap COVID with pulmonary embolism vaculitis    Examination-Activity Limitations Squat;Stairs;Locomotion Level;Lift    Examination-Participation Restrictions Occupation;Community Activity    Stability/Clinical  Decision Making Evolving/Moderate complexity    Rehab Potential Good    PT Frequency 1x / week   plus eval   PT Duration 8 weeks    PT Treatment/Interventions Gait training;Stair training;Functional mobility training;Therapeutic activities;Therapeutic exercise;Balance training;Neuromuscular re-education;Patient/family education;ADLs/Self Care Home Management;Visual/perceptual remediation/compensation;Energy conservation;Vestibular;Manual techniques    PT Next Visit Plan Continue high level balance/strengthening. Scifit/Nustep for endurance. Complete gait training (land/treadmill) focused on improved distance    Consulted and Agree with Plan of Care Patient           Patient will benefit from skilled therapeutic intervention in order to improve the following deficits and impairments:  Abnormal gait,Obesity,Decreased strength,Decreased balance,Improper body mechanics,Impaired vision/preception,Impaired perceived functional ability,Difficulty walking,Decreased coordination,Cardiopulmonary status limiting activity,Decreased activity tolerance,Decreased endurance  Visit Diagnosis: Other abnormalities of gait and mobility  Muscle weakness (generalized)     Problem List Patient Active Problem List   Diagnosis Date Noted  . Acute CVA (cerebrovascular accident) (HCC) 03/27/2020  . Sleep apnea   . Diabetes mellitus without complication (HCC)   . COVID-19 long hauler   . Obesity   . Acute pulmonary embolism (HCC) 06/30/2019  . Pulmonary embolism (HCC) 05/2019  . Hypernatremia   . Asthma, chronic   . DKA (diabetic ketoacidoses) 11/27/2014  . Diabetes mellitus, new onset (HCC)   . Hyperglycemic hyperosmolar nonketotic coma (HCC)   . Essential hypertension      A , PT, DPT, NCS 05/24/2020, 2:56 PM  [facility] [address] [place], [place], [address] Phone: [phone]   Fax:  [phone]  Name: Shelly Shields MRN: [id number] Date of Birth: 12/10/1980   

## 2020-05-24 NOTE — Progress Notes (Signed)
Primary Physician/Referring:  [name], MD  Patient ID: Shelly Shields, female    DOB: 1980-08-22, 39 y.o.   MRN: [id number]  Chief Complaint  Patient presents with  . History of CVA with residual deficit  . Follow-up   HPI:    Shelly Shields  is a 39 y.o. African-American female with hypertension, hyperlipidemia, uncontrolled diabetes mellitus, obesity and obstructive sleep apnea on CPAP and history of Covid pneumonia in January 2021 and also had pulmonary embolism during Covid infection.  She is also being evaluated for vasculitis. 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 Cardiomyopathy etiology likely stress cardiomyopathy vs significant coronary artery disease vs small PFO noted on TEE vs atrial fibrillation.   Patient presents for 2-week follow-up of heart failure medication titration.  At last visit increase carvedilol from 6.25 mg to 12.5 mg twice daily.  Patient has tolerated increase of carvedilol.  She denies chest pain, palpitations, dyspnea, leg swelling, dizziness, PND, edema.  Patient is still wearing cardiac monitor.   Past Medical History:  Diagnosis Date  . Asthma   . COVID-19 long hauler   . Diabetes mellitus without complication ([place])   . Hypertension   . Migraines   . Obesity   . Pulmonary embolism ([place]) 05/2019   with COVID  . Sleep apnea    Past Surgical History:  Procedure Laterality Date  . BILATERAL CARPAL TUNNEL RELEASE    . TEE WITHOUT CARDIOVERSION N/A 03/28/2020   Procedure: TRANSESOPHAGEAL ECHOCARDIOGRAM (TEE);  Surgeon: Adrian Prows, MD;  Location: [facility] ENDOSCOPY;  Service: Cardiovascular;  Laterality: N/A;   Family History  Problem Relation Age of Onset  . Hypertension Mother   . Thyroid disease Mother   . Hypertension Father   . Stroke Father 66  . Diabetes Father   . Sleep apnea Father   . CVA Paternal Uncle 58    Social History    Tobacco Use  . Smoking status: Never Smoker  . Smokeless tobacco: Never Used  Substance Use Topics  . Alcohol use: Yes    Alcohol/week: 0.0 standard drinks    Comment: occasional    Marital Status: Single   ROS  Review of Systems  Constitutional: Negative for malaise/fatigue and weight gain.  Cardiovascular: Negative for chest pain, claudication, leg swelling, near-syncope, orthopnea, palpitations, paroxysmal nocturnal dyspnea and syncope.  Respiratory: Negative for shortness of breath.   Hematologic/Lymphatic: Does not bruise/bleed easily.  Gastrointestinal: Negative for melena.  Neurological: Negative for dizziness and weakness.    Objective  Blood pressure 128/85, pulse 90, resp. rate 16, height 5' 1" (1.549 m), weight 224 lb (101.6 kg), SpO2 96 %.  Vitals with BMI 05/25/2020 05/24/2020 05/17/2020  Height 5' 1" - -  Weight 224 lbs - -  BMI 37.85 - -  Systolic 885 - 027  Diastolic 85 - 87  Pulse 90 66 101      Physical Exam Vitals reviewed.  Constitutional:      Appearance: She is obese.  HENT:     Head: Normocephalic and atraumatic.  Cardiovascular:     Rate and Rhythm: Normal rate and regular rhythm.     Pulses: Intact distal pulses.     Heart sounds: S1 normal and S2 normal. No murmur heard. No gallop.      Comments: No leg edema.  No JVD.  Pulmonary:     Effort: Pulmonary effort is normal. No respiratory distress.  Breath sounds: No wheezing, rhonchi or rales.  Musculoskeletal:     Right lower leg: No edema.     Left lower leg: No edema.  Neurological:     Mental Status: She is alert.     Cranial Nerves: Cranial nerve deficit (Speech has improved) present.     Gait: Gait abnormal (no longer using cane to ambulate).     Laboratory examination:   Recent Labs    02/06/20 2118 03/27/20 0946 03/29/20 0229 05/01/20 1612 05/22/20 1624  NA 139   < > 139 144 141  K 4.0   < > 3.0* 4.0 3.6  CL 108   < > 109 110* 111*  CO2 21*   < > 20* 19* 17*   GLUCOSE 238*   < > 187* 189* 251*  BUN 14   < > _0 CREATININE 1.13*   < > 0.81 1.03* 0.95  CALCIUM 9.0   < > 8.7* 9.1 8.8  GFRNONAA >60   < > >60 68 75  GFRAA >60  --   --  79 87   < > = values in this interval not displayed.   estimated creatinine clearance is 86.1 mL/min (by C-G formula based on SCr of 0.95 mg/dL).  CMP Latest Ref Rng & Units 05/22/2020 05/01/2020 03/29/2020  Glucose 65 - 99 mg/dL 251(H) 189(H) 187(H)  BUN 6 - 24 mg/dL _1 Creatinine 0.57 - 1.00 mg/dL 0.95 1.03(H) 0.81  Sodium 134 - 144 mmol/L 141 144 139  Potassium 3.5 - 5.2 mmol/L 3.6 4.0 3.0(L)  Chloride 96 - 106 mmol/L 111(H) 110(H) 109  CO2 20 - 29 mmol/L 17(L) 19(L) 20(L)  Calcium 8.7 - 10.2 mg/dL 8.8 9.1 8.7(L)  Total Protein 6.5 - 8.1 g/dL - - -  Total Bilirubin 0.3 - 1.2 mg/dL - - -  Alkaline Phos 38 - 126 U/L - - -  AST 15 - 41 U/L - - -  ALT 0 - 44 U/L - - -   CBC Latest Ref Rng & Units 03/29/2020 03/27/2020 03/27/2020  WBC 4.0 - 10.5 K/uL 10.4 - 11.7(H)  Hemoglobin 12.0 - 15.0 g/dL 12.7 12.9 12.8  Hematocrit 36.0 - 46.0 % 38.3 38.0 39.3  Platelets 150 - 400 K/uL 245 - 195    Lipid Panel Recent Labs    03/28/20 0415  CHOL 176  TRIG 106  LDLCALC 126*  VLDL 21  HDL 29*  CHOLHDL 6.1    HEMOGLOBIN A1C Lab Results  Component Value Date   HGBA1C 11.2 (H) 03/27/2020   MPG 274.74 03/27/2020   TSH Recent Labs    03/28/20 0415  TSH 3.896    External labs:  None   Medications and allergies   Allergies  Allergen Reactions  . Lisinopril Cough  . Metformin Diarrhea  . Ibuprofen Hives  . Ketorolac Hives     Outpatient Medications Prior to Visit  Medication Sig Dispense Refill  . apixaban (ELIQUIS) 5 MG TABS tablet Take 1 tablet (5 mg total) by mouth 2 (two) times daily. 60 tablet 0  . Baclofen 5 MG TABS TAKE 1 TABLET BY MOUTH THREE TIMES DAILY AS NEEDED FOR HEADACHE OR NECK PAIN    . Blood Glucose Monitoring Suppl (ACCU-CHEK NANO SMARTVIEW) W/DEVICE KIT 1 Device by Does  not apply route 4 (four) times daily - after meals and at bedtime. 1 kit 0  . butalbital-acetaminophen-caffeine (FIORICET) 50-325-40 MG tablet Take 1 tablet by mouth every 6 (six)  hours as needed for headache. 20 tablet 0  . carvedilol (COREG) 25 MG tablet Take 0.5 tablets (12.5 mg total) by mouth 2 (two) times daily with a meal. 60 tablet 3  . escitalopram (LEXAPRO) 10 MG tablet Take 10 mg by mouth daily.    . fexofenadine (ALLEGRA) 180 MG tablet Take 180 mg by mouth daily as needed.     . fluticasone (FLONASE) 50 MCG/ACT nasal spray Place 2 sprays into both nostrils daily as needed for allergies.   3  . fluticasone (FLOVENT HFA) 110 MCG/ACT inhaler Inhale 1 puff into the lungs daily.     Marland Kitchen glucose blood (ACCU-CHEK SMARTVIEW) test strip Check sugar 6 x daily (Patient taking differently: Check sugar 6 x daily. Uses true test instead) 200 each 3  . insulin glargine (LANTUS) 100 UNIT/ML Solostar Pen Inject 80 Units into the skin 2 (two) times daily.    . insulin lispro protamine-lispro (HUMALOG 75/25 MIX) (75-25) 100 UNIT/ML SUSP injection Inject 50 Units into the skin 3 (three) times daily. Sliding scale    . Insulin Pen Needle (INSUPEN PEN NEEDLES) 32G X 4 MM MISC BD Pen Needles- brand specific. Inject insulin via insulin pen daily 200 each 3  . Lancet Devices (ACCU-CHEK SOFTCLIX) lancets Use as instructed for blood glucose checks four times daily, before meals and at bedtime 100 each 5  . medroxyPROGESTERone (DEPO-PROVERA) 150 MG/ML injection Inject 150 mg into the muscle every 3 (three) months.    . montelukast (SINGULAIR) 10 MG tablet Take 10 mg by mouth at bedtime.    . nortriptyline (PAMELOR) 10 MG capsule Take 10 mg by mouth at bedtime.    . ondansetron (ZOFRAN) 4 MG tablet Take 1 tablet (4 mg total) by mouth every 8 (eight) hours as needed for nausea or vomiting. 20 tablet 0  . pantoprazole (PROTONIX) 40 MG tablet Take 40 mg by mouth daily.    . rosuvastatin (CRESTOR) 20 MG tablet Take 1  tablet (20 mg total) by mouth daily at 6 PM. 30 tablet 0  . topiramate (TOPAMAX) 100 MG tablet Take 200 mg by mouth at bedtime.     . TRUEPLUS LANCETS 33G MISC USE TO CHECK BLOOD GLUCOSE QID - AFTER MEALS AND AT BEDTIME  3  . VENTOLIN HFA 108 (90 BASE) MCG/ACT inhaler Use as directed 2 puffs in the mouth or throat 4 (four) times daily as needed for shortness of breath.   3  . sacubitril-valsartan (ENTRESTO) 24-26 MG Take 1 tablet by mouth 2 (two) times daily. 60 tablet 0   No facility-administered medications prior to visit.     Radiology:   No results found. CT Code Stroke CTA Head W/WO contrast 03/27/2020 1. Negative for large vessel occlusion but positive for Right MCA M3 occlusion. CT Perfusion detects core infarct with minimally larger penumbra at the right insula/operculum corresponding to the plain CT finding.  2. Additionally, mild vessel irregularity is noted in multiple other circle-of-Willis branches, including the left A1, right P2, left M2. Although nonspecific this constellation of clinical and imaging findings might indicate accelerated branch vessel atherosclerosis. Although other large vessels appear normal, with no atherosclerosis in the neck or at the aortic arch.  MR ANGIO HEAD WO CONTRAST 03/27/2020 1. Moderate-sized acute right MCA infarct. 2. Small subacute left parieto-occipital infarcts. 3. Right M3 branch occlusion, otherwise negative head [id number]  CT ABDOMEN PELVIS W CONTRAST 03/27/2020 1. New 18 mm hypoattenuating lesion upper pole right kidney with areas of subcapsular multifocal decreased  enhancement in the lower pole right kidney. Imaging features are nonspecific and could be related to pyelonephritis and phlegmon/evolving abscess in the upper pole right kidney cannot be excluded. Alternatively, multifocal right renal infarct could have this appearance. Correlation with urinalysis may prove helpful.  2. Relatively well-defined 2.2 cm lesion in the inferior  lingula measures water density. This may be loculated pleural fluid or intraparenchymal fluid collection in this patient with a history of pulmonary embolus and bilateral ground-glass airspace disease on the previous study suspicious for multifocal atypical/viral pneumonia. Consider follow-up to ensure resolution.  Cardiac Studies:   Cardiac monitor: pending   ECHOCARDIOGRAM COMPLETE BUBBLE STUDY 03/27/2020 1. Left ventricular ejection fraction, by estimation, is 20 to 25%. The left ventricle has severely decreased function. There is severe hypokinesis of the basal-to-apical anteroseptum, inferoseptum and anterior LV walls . Left ventricular diastolic  parameters are consistent with Grade II diastolic dysfunction (pseudonormalization). 2. Right ventricular systolic function is normal. The right ventricular size is normal. 3. Left atrial size was moderately dilated. 4. The mitral valve is normal in structure. Trivial mitral valve regurgitation. 5. The aortic valve is tricuspid. There is mild thickening of the aortic valve. Aortic valve regurgitation is not visualized. 6. The inferior vena cava is normal in size with greater than 50% respiratory variability, suggesting right atrial pressure of 3 mmHg. 7. Agitated saline contrast bubble study was negative, with no evidence of any interatrial shunt.  Comparison(s): Compared to prior echo in 06/2019, the LVEF is now severely reduced to 20-25% with severe hypokinesis of the inferoseptal, anteroseptal and anterior LV walls.  EKG:   EKG 04/18/2020: Sinus rhythm at a rate of 90 bpm. Left axis deviation. PRWP, cannot exclude anteroseptal infarct old. Non-specific T wave abnormality.   EKG 03/27/2020: Sinus tachycardia at rate of 102 bpm, normal axis, borderline criteria for LVH. Borderline QT elongation. Compared to 06/30/2019, sinus tachycardia new. No change in QT elongation.  Assessment     ICD-10-CM   1. Dilated cardiomyopathy ([place])   J68.1 Basic metabolic panel  2. Chronic systolic heart failure (HCC)  L57.26 Basic metabolic panel  3. History of CVA with residual deficit  I69.30   4. Essential hypertension  I10      Medications Discontinued During This Encounter  Medication Reason  . sacubitril-valsartan (ENTRESTO) 24-26 MG Change in therapy    Meds ordered this encounter  Medications  . sacubitril-valsartan (ENTRESTO) 49-51 MG    Sig: Take 1 tablet by mouth 2 (two) times daily.    Dispense:  60 tablet    Refill:  2    Recommendations:   Shelly Shields is a 39 y.o. African-American female with hypertension, hyperlipidemia, uncontrolled diabetes mellitus, obesity and obstructive sleep apnea on CPAP and history of Covid pneumonia in January 2021 and also had pulmonary embolism during Covid infection.  She is also being evaluated for vasculitis. 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 Cardiomyopathy etiology likely stress cardiomyopathy vs significant coronary artery disease vs small PFO noted on TEE vs atrial fibrillation.   Patient presents for 2-week follow-up of her 2-week follow-up of heart failure medication.  Patient can is to tolerate present guideline directed medical therapy.  Upon recheck patient's renal function has stabilized.  Will increase Entresto from 24/26 mg to 49/51 mg twice daily.  We will repeat BMP in 1 week.  As patient's heart rate remains >70 would like to also further up  titrate her carvedilol.  Patient will continue to monitor her blood pressure and heart rate on a daily basis at home and keep a written log.  I will call patient in 2 weeks to review blood pressure and heart rate Will.  If she is tolerating increased dose of Entresto at that time, will then increase carvedilol from 12.5 mg to 25 mg twice daily.  Patient's hypercoagulabilty workup is negative, however will continue Eliquis due to  recurrent thromboembolic events. Will consider repeat echocardiogram likely in 2-3 months. Patient will need ischemic evaluation, however at this time will first titrate heart failure medications.   Follow-up in the office in 4 weeks for heart failure and continued medication titration.   Alethia Berthold, PA-C 05/25/2020, 5:27 PM Office: [phone]

## 2020-05-24 NOTE — Therapy (Signed)
[facility] [address] [place], [place], [address] Phone: [phone]   Fax:  [phone]  Occupational Therapy Treatment  Patient Details  Name: Shelly Shields MRN: [id number] Date of Birth: [birth year] Referring Provider (OT): [facility] - referred - will send reports to PCP per patient's request - Dr Arlan Organ   Encounter Date: 05/24/2020   OT End of Session - 05/24/20 1524    Visit Number 12    Number of Visits 17    Date for OT Re-Evaluation 06/25/20    Authorization Type 60 combined visits OT/PT/SLP [facility])    Authorization Time Period week 7 of 8    OT Start Time 1315    OT Stop Time 1400    OT Time Calculation (min) 45 min    Activity Tolerance Patient tolerated treatment well    Behavior During Therapy [facility] for tasks assessed/performed;Flat affect           Past Medical History:  Diagnosis Date  . Asthma   . COVID-19 long hauler   . Diabetes mellitus without complication (HCC)   . Hypertension   . Migraines   . Obesity   . Pulmonary embolism (HCC) 05/2019   with COVID  . Sleep apnea     Past Surgical History:  Procedure Laterality Date  . BILATERAL CARPAL TUNNEL RELEASE    . TEE WITHOUT CARDIOVERSION N/A 03/28/2020   Procedure: TRANSESOPHAGEAL ECHOCARDIOGRAM (TEE);  Surgeon: Yates Decamp, MD;  Location: [facility] ENDOSCOPY;  Service: Cardiovascular;  Laterality: N/A;    There were no vitals filed for this visit.   Subjective Assessment - 05/24/20 1320    Subjective  I got a DexCom today - latest and greatest to read blood sugar    Pertinent History Covid, Migraines, DM, Sleep apnea    Currently in Pain? No/denies    Pain Score 0-No pain                        OT Treatments/Exercises (OP) - 05/24/20 0001      ADLs   Writing Continuing to work on typing skills.  Reviewing typing as related to work expectations.  Patient would type to fill in fields, and demographic or  insurance information - versus full paragraphs.  Patient is typing slowly for accuracy.  With faster timed typing, increase errors.    Driving Patient reports that she is driving, and was told to drive in places that were not crowded with another driver.  Patient encouraged to speak with physician regarding returning to driving.    Work Patient is hoping to return to work, and reports that she does have the option of working modified / shorter schedule, and she is sure she would have initial supervision to ensure quality/accuracy.  .  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                    OT Short Term Goals - 05/24/20 1526      OT SHORT TERM GOAL #1   Title Patient will complete HEP designed to improve coordiantion in non dominant LUE    Period Weeks    Status Achieved    Target Date 05/26/20      OT SHORT TERM GOAL #2   Title Patient will type three full sentences using two hands on  keyboard in less than 5 minutes    Time 4    Period Weeks    Status Achieved      OT SHORT TERM GOAL #3   Title Patient will navigate through empty hallway in familiar env't without hitting any obstacles placed in left field    Time 4    Period Weeks    Status Achieved      OT SHORT TERM GOAL #4   Title Patient will prepare a simple, familiar hot dish using stove top or oven with minimal cueing    Time 4    Period Weeks    Status Achieved      OT SHORT TERM GOAL #5   Title Patient will demonstrate 3lb increase in grip strength in BUE's    Time 4    Period Weeks    Status Achieved             OT Long Term Goals - 05/24/20 1344      OT LONG TERM GOAL #1   Title Patient will complete an updated HEP to address overall upper body strength and conditioning    Time 8    Period Weeks    Status On-going      OT LONG TERM GOAL #2   Title Patient will demonstrate a 5  lb increase in BUE strength to aide in opening packages and carrying items    Time 8    Period Weeks    Status On-going   LUE 41.6 RUE 69.8     OT LONG TERM GOAL #3   Title Patient will demonstrate a 5 sec reduction in time on 9 hole peg test to aide in 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    Time 8    Period Weeks    Status Achieved      OT LONG TERM GOAL #4   Title Patient will demonstrate understanding of recommendations relating to returning to driving    Time 8    Period Weeks    Status On-going      OT LONG TERM GOAL #5   Title Patient will demonstrate understanding of recommendations relating to return to work    Time 8    Period Weeks    Status On-going                 Plan - 05/24/20 1525    Clinical Impression Statement Pt is continuing to progress toward her goals. Patient is showing steady improvement with physical goals.  Patient reports continued attentional and language impairments which may impede ability to return to work    OT Occupational Profile and History Detailed Assessment- Review of Records and additional review of physical, cognitive, psychosocial history related to current functional performance    Occupational performance deficits (Please refer to evaluation for details): ADL's;IADL's;Work;Leisure    Body Structure / Function / Physical Skills ADL;Coordination;Endurance;UE functional use;Balance;Decreased knowledge of use of DME;Flexibility;IADL;Vision;Dexterity;FMC;Strength    Cognitive Skills Attention;Emotional;Energy/Drive;Memory;Problem Solve;Sequencing;Safety Awareness    Rehab Potential Good    Clinical Decision Making Several treatment options, min-mod task modification necessary    Modification or Assistance to Complete Evaluation  Min-Moderate modification of tasks or assist with assess necessary to complete eval    OT Frequency 2x / week    OT Duration 8 weeks    OT  Treatment/Interventions Self-care/ADL training;Therapeutic exercise;Visual/perceptual remediation/compensation;Patient/family education;Neuromuscular education;Therapeutic activities;Balance training;DME and/or AE instruction;Cognitive remediation/compensation;Energy  conservation;Functional Mobility Training    Plan review HEP, return to work skills    Consulted and Agree with Plan of Care Patient           Patient will benefit from skilled therapeutic intervention in order to improve the following deficits and impairments:   Body Structure / Function / Physical Skills: ADL,Coordination,Endurance,UE functional use,Balance,Decreased knowledge of use of DME,Flexibility,IADL,Vision,Dexterity,FMC,Strength Cognitive Skills: Attention,Emotional,Energy/Drive,Memory,Problem Solve,Sequencing,Safety Awareness     Visit Diagnosis: Muscle weakness (generalized)  Visuospatial deficit  Other lack of coordination  Hemiplegia and hemiparesis following cerebral infarction affecting left non-dominant side [facility])    Problem List Patient Active Problem List   Diagnosis Date Noted  . Acute CVA (cerebrovascular accident) (HCC) 03/27/2020  . Sleep apnea   . Diabetes mellitus without complication (HCC)   . COVID-19 long hauler   . Obesity   . Acute pulmonary embolism (HCC) 06/30/2019  . Pulmonary embolism (HCC) 05/2019  . Hypernatremia   . Asthma, chronic   . DKA (diabetic ketoacidoses) 11/27/2014  . Diabetes mellitus, new onset (HCC)   . Hyperglycemic hyperosmolar nonketotic coma (HCC)   . Essential hypertension     Collier Salina, OTR/L 05/24/2020, 3:27 PM  [facility] [address] [place], [place], [address] Phone: [phone]   Fax:  [phone]  Name: Shelly Shields MRN: [id number] Date of Birth: 09/15/1980

## 2020-05-25 ENCOUNTER — Ambulatory Visit: Payer: 59 | Admitting: Student

## 2020-05-25 ENCOUNTER — Encounter: Payer: Self-pay | Admitting: Student

## 2020-05-25 VITALS — BP 128/85 | HR 90 | Resp 16 | Ht 61.0 in | Wt 224.0 lb

## 2020-05-25 DIAGNOSIS — I420 Dilated cardiomyopathy: Secondary | ICD-10-CM

## 2020-05-25 DIAGNOSIS — I6930 Unspecified sequelae of cerebral infarction: Secondary | ICD-10-CM

## 2020-05-25 DIAGNOSIS — I5022 Chronic systolic (congestive) heart failure: Secondary | ICD-10-CM

## 2020-05-25 DIAGNOSIS — I10 Essential (primary) hypertension: Secondary | ICD-10-CM

## 2020-05-25 MED ORDER — ENTRESTO 49-51 MG PO TABS: 1.0000 | ORAL_TABLET | Freq: Two times a day (BID) | ORAL | 2 refills | Status: DC

## 2020-05-30 ENCOUNTER — Encounter: Payer: Self-pay | Admitting: Occupational Therapy

## 2020-05-30 ENCOUNTER — Ambulatory Visit: Payer: 59

## 2020-05-30 ENCOUNTER — Other Ambulatory Visit: Payer: Self-pay

## 2020-05-30 ENCOUNTER — Ambulatory Visit: Payer: 59 | Attending: Internal Medicine | Admitting: Occupational Therapy

## 2020-05-30 DIAGNOSIS — I69354 Hemiplegia and hemiparesis following cerebral infarction affecting left non-dominant side: Secondary | ICD-10-CM | POA: Insufficient documentation

## 2020-05-30 DIAGNOSIS — R41842 Visuospatial deficit: Secondary | ICD-10-CM

## 2020-05-30 DIAGNOSIS — R41841 Cognitive communication deficit: Secondary | ICD-10-CM | POA: Diagnosis present

## 2020-05-30 DIAGNOSIS — R471 Dysarthria and anarthria: Secondary | ICD-10-CM | POA: Insufficient documentation

## 2020-05-30 DIAGNOSIS — R2689 Other abnormalities of gait and mobility: Secondary | ICD-10-CM | POA: Insufficient documentation

## 2020-05-30 DIAGNOSIS — M6281 Muscle weakness (generalized): Secondary | ICD-10-CM | POA: Diagnosis present

## 2020-05-30 DIAGNOSIS — R2681 Unsteadiness on feet: Secondary | ICD-10-CM | POA: Diagnosis present

## 2020-05-30 DIAGNOSIS — R4701 Aphasia: Secondary | ICD-10-CM | POA: Insufficient documentation

## 2020-05-30 DIAGNOSIS — R278 Other lack of coordination: Secondary | ICD-10-CM | POA: Diagnosis present

## 2020-05-30 NOTE — Therapy (Signed)
[place] [address] [place], [name], [address] Phone: [phone]   Fax:  [phone]  Occupational Therapy Treatment  Patient Details  Name: Shelly Shields MRN: [id number] Date of Birth: 1981/04/05 Referring Provider (OT): [facility] - referred - will send reports to PCP per patient's request - Dr Salvadore Oxford   Encounter Date: 05/30/2020   OT End of Session - 05/30/20 1743    Visit Number 13    Number of Visits 17    Date for OT Re-Evaluation 06/25/20    Authorization Type 60 combined visits OT/PT/SLP [facility])    Authorization Time Period week 8 of 8    OT Start Time 1315    OT Stop Time 1400    OT Time Calculation (min) 45 min    Activity Tolerance Patient tolerated treatment well    Behavior During Therapy [facility] for tasks assessed/performed;Flat affect           Past Medical History:  Diagnosis Date  . Asthma   . COVID-19 long hauler   . Diabetes mellitus without complication ([place])   . Hypertension   . Migraines   . Obesity   . Pulmonary embolism ([place]) 05/2019   with COVID  . Sleep apnea     Past Surgical History:  Procedure Laterality Date  . BILATERAL CARPAL TUNNEL RELEASE    . TEE WITHOUT CARDIOVERSION N/A 03/28/2020   Procedure: TRANSESOPHAGEAL ECHOCARDIOGRAM (TEE);  Surgeon: Adrian Prows, MD;  Location: [place];  Service: Cardiovascular;  Laterality: N/A;    There were no vitals filed for this visit.   Subjective Assessment - 05/30/20 1320    Subjective  I am tired - family member keeping her active (1.41 yr old)  Had a death in the family    Pertinent History Covid, Migraines, DM, Sleep apnea    Currently in Pain? No/denies    Pain Score 0-No pain                        OT Treatments/Exercises (OP) - 05/30/20 0001      ADLs   Writing Patient generated 5 work tasks, that we formed into sentences that she then typed.  She was able to type five sentences in under  five minutes, and correct errors.      Neurological Re-education Exercises   Other Exercises 1 Worked on fine motor coordiantion activities.  Patient making same error on multiple occasions, with little attempt to self correct. Able to accept and use verbal and visual feedback to improve performance.    Other Exercises 2 Worked on more gross motor coordiantion tasks, tossong and catching a large ball, tossing and catching smaller balls - one in each hand.  Patient became quite short of breath while walking and tossing ball.  Needed seated rest break, although able to recover within one minute.                    OT Short Term Goals - 05/30/20 1745      OT SHORT TERM GOAL #1   Title Patient will complete HEP designed to improve coordiantion in non dominant LUE    Period Weeks    Status Achieved    Target Date 05/26/20      OT SHORT TERM GOAL #2   Title Patient will type three full sentences using two hands on keyboard in less than 5 minutes    Time 4  Period Weeks    Status Achieved      OT SHORT TERM GOAL #3   Title Patient will navigate through empty hallway in familiar env't without hitting any obstacles placed in left field    Time 4    Period Weeks    Status Achieved      OT SHORT TERM GOAL #4   Title Patient will prepare a simple, familiar hot dish using stove top or oven with minimal cueing    Time 4    Period Weeks    Status Achieved      OT SHORT TERM GOAL #5   Title Patient will demonstrate 3lb increase in grip strength in BUE's    Time 4    Period Weeks    Status Achieved             OT Long Term Goals - 05/30/20 1745      OT LONG TERM GOAL #1   Title Patient will complete an updated HEP to address overall upper body strength and conditioning    Time 8    Period Weeks    Status On-going      OT LONG TERM GOAL #2   Title Patient will demonstrate a 5 lb increase in BUE strength to aide in opening packages and carrying items    Time 8     Period Weeks    Status On-going   LUE 41.6 RUE 69.8     OT LONG TERM GOAL #3   Title Patient will demonstrate a 5 sec reduction in time on 9 hole peg test to aide in 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    Time 8    Period Weeks    Status Achieved      OT LONG TERM GOAL #4   Title Patient will demonstrate understanding of recommendations relating to returning to driving    Time 8    Period Weeks    Status On-going      OT LONG TERM GOAL #5   Title Patient will demonstrate understanding of recommendations relating to return to work    Time 8    Period Weeks    Status On-going                 Plan - 05/30/20 1744    Clinical Impression Statement Pt is showing improved functional fine motor coordination, however is still limited by language and attentional impairments.    OT Occupational Profile and History Detailed Assessment- Review of Records and additional review of physical, cognitive, psychosocial history related to current functional performance    Occupational performance deficits (Please refer to evaluation for details): ADL's;IADL's;Work;Leisure    Body Structure / Function / Physical Skills ADL;Coordination;Endurance;UE functional use;Balance;Decreased knowledge of use of DME;Flexibility;IADL;Vision;Dexterity;FMC;Strength    Cognitive Skills Attention;Emotional;Energy/Drive;Memory;Problem Solve;Sequencing;Safety Awareness    Rehab Potential Good    Clinical Decision Making Several treatment options, min-mod task modification necessary    Modification or Assistance to Complete Evaluation  Min-Moderate modification of tasks or assist with assess necessary to complete eval    OT Frequency 2x / week    OT Duration 8 weeks    OT Treatment/Interventions Self-care/ADL training;Therapeutic exercise;Visual/perceptual remediation/compensation;Patient/family education;Neuromuscular education;Therapeutic  activities;Balance training;DME and/or AE instruction;Cognitive remediation/compensation;Energy conservation;Functional Mobility Training    Plan review remianing goals, return to work skills    Consulted and Agree with Plan of Care Patient  Patient will benefit from skilled therapeutic intervention in order to improve the following deficits and impairments:   Body Structure / Function / Physical Skills: ADL,Coordination,Endurance,UE functional use,Balance,Decreased knowledge of use of DME,Flexibility,IADL,Vision,Dexterity,FMC,Strength Cognitive Skills: Attention,Emotional,Energy/Drive,Memory,Problem Solve,Sequencing,Safety Awareness     Visit Diagnosis: Muscle weakness (generalized)  Visuospatial deficit  Other lack of coordination  Hemiplegia and hemiparesis following cerebral infarction affecting left non-dominant side [facility])    Problem List Patient Active Problem List   Diagnosis Date Noted  . Acute CVA (cerebrovascular accident) (HCC) 03/27/2020  . Sleep apnea   . Diabetes mellitus without complication (HCC)   . COVID-19 long hauler   . Obesity   . Acute pulmonary embolism (HCC) 06/30/2019  . Pulmonary embolism (HCC) 05/2019  . Hypernatremia   . Asthma, chronic   . DKA (diabetic ketoacidoses) 11/27/2014  . Diabetes mellitus, new onset (HCC)   . Hyperglycemic hyperosmolar nonketotic coma (HCC)   . Essential hypertension     Collier Salina, OTR/L 05/30/2020, 5:46 PM  [facility] [address] [place], [place], [address] Phone: [phone]   Fax:  [phone]  Name: Shelly Shields MRN: [id number] Date of Birth: [birth year]

## 2020-05-30 NOTE — Therapy (Signed)
[facility] [address] [place], [place], [address] Phone: [phone]   Fax:  [phone]  Speech Language Pathology Treatment  Patient Details  Name: Shelly Shields MRN: [id number] Date of Birth: 12-13-1980 Referring Provider (SLP): (ref) Dr. Ernest Mallick, (Documentation PCP) Arlan Organ, MD   Encounter Date: 05/30/2020   End of Session - 05/30/20 1449    Visit Number 5    Number of Visits 17    Date for SLP Re-Evaluation 08/07/20    Authorization - Visit Number 5    Authorization - Number of Visits 60    SLP Start Time 1404    SLP Stop Time  1445    SLP Time Calculation (min) 41 min    Activity Tolerance Patient tolerated treatment well           Past Medical History:  Diagnosis Date  . Asthma   . COVID-19 long hauler   . Diabetes mellitus without complication (HCC)   . Hypertension   . Migraines   . Obesity   . Pulmonary embolism (HCC) 05/2019   with COVID  . Sleep apnea     Past Surgical History:  Procedure Laterality Date  . BILATERAL CARPAL TUNNEL RELEASE    . TEE WITHOUT CARDIOVERSION N/A 03/28/2020   Procedure: TRANSESOPHAGEAL ECHOCARDIOGRAM (TEE);  Surgeon: Yates Decamp, MD;  Location: [facility] ENDOSCOPY;  Service: Cardiovascular;  Laterality: N/A;    There were no vitals filed for this visit.   Subjective Assessment - 05/30/20 1402    Subjective Pt arrives talking at a fast rate of speech upon explanation of OT.    Currently in Pain? No/denies                 ADULT SLP TREATMENT - 05/30/20 1407      General Information   Behavior/Cognition Alert;Cooperative;Pleasant mood      Treatment Provided   Treatment provided Cognitive-Linquistic      Cognitive-Linquistic Treatment   Treatment focused on Dysarthria;Patient/family/caregiver education    Skilled Treatment Pt forgot her HEP today. Haywood Lasso states she cooked spaghetti and meat sauce without incident - mother there watching  the whole time. During this explanation pt did not exhibit any spech compensations and SLP asked pt to repeat x3 during this 6 minute explanation. SLP highlighted this and pt immediately slowed her speech rate - SLP with two request for repeat in 15 minutes. Rare min A to have pt slow her rate. After this time pt req'd min A occasionally for rate reduction. SLP asked pt how she wanted to work on her speech clarity this week and she wants to be better with talking with her family using slower rate - she chose between 7 and 8 pm, and noon and 12:30 to be the times pt will focus on enunciating/overarticuation at home. No detected anomia during today's session.      Assessment / Recommendations / Plan   Plan Continue with current plan of care      Progression Toward Goals   Progression toward goals Progressing toward goals              SLP Short Term Goals - 05/30/20 1451      SLP SHORT TERM GOAL #1   Title pt will perform dysarhthria HEP with rare min x2 consecutive sessions    Time 1    Period Weeks   or 9 total sessions   Status On-going      SLP SHORT TERM GOAL #  2   Title pt will complete aphasia testing PRN and goals added PRN    Status Deferred      SLP SHORT TERM GOAL #3   Title pt will undergo cognitive communication testing and goals added PRN    Status Achieved      SLP SHORT TERM GOAL #4   Title pt will use compensatory measures for speech intelligibilty of 95%+ in 10 minutes simple conversation x3 sessions    Time 1    Period Weeks    Status On-going      SLP SHORT TERM GOAL #5   Title pt will demo awareness of errors, given self correction, in simple cognitive linguistic tasks in 3 sessions    Time 1    Period Weeks    Status On-going      SLP SHORT TERM GOAL #6   Title pt will demonstrate appropriate compensatory measures for decr'd attention skills in simple cognitve linguistic tasks    Time 1    Period Weeks    Status On-going            SLP Long Term  Goals - 05/30/20 1452      SLP LONG TERM GOAL #1   Title pt will use compensatory measures for speech intelligibilty of 95%+ in 10 minutes mod complex conversation, using self correction PRN x3 sessions    Time 5    Period Weeks   or 17 total sessions, for all LTGs   Status On-going      SLP LONG TERM GOAL #2   Title pt will use compensations for speech intelligiblity in 19/20 sentence responses resulting in 100% intelligbility in 3 sessions    Time 5    Period Weeks    Status On-going      SLP LONG TERM GOAL #3   Title pt will demonstrate appropriate anticipatory awareness in mod complex cognitive linguistic tasks by spontaneously self-correcting or double checking work 100% of the time in 3 sessions    Time 5    Period Weeks    Status On-going      SLP LONG TERM GOAL #4   Title pt will have 100% accuracy in simultaneous communicative tasks (simple divided attention) or make compensations for situations where she would have normally performed divided attention, in 3 sessions    Time 5    Period Weeks    Status On-going      SLP LONG TERM GOAL #5   Title in her last 1-2 visits, pt will demonstrate improved QOL score (</= 80) on speech QOL measure    Time 5    Period Weeks    Status On-going            Plan - 05/30/20 1450    Clinical Impression Statement 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 Skilled ST is necessary for pt to improve speech clarity and cognition, as well as any lingering expressive aphasia.    Speech  Therapy Frequency 2x / week    Duration 8 weeks   17 visits   Treatment/Interventions Language facilitation;Cueing hierarchy;Cognitive  reorganization;Internal/external aids;Patient/family education;Compensatory strategies;SLP instruction and feedback;Multimodal communcation approach;Functional tasks;Oral motor exercises    Potential to Achieve Goals Good           Patient will benefit from skilled therapeutic intervention in order to improve the following deficits and impairments:   Dysarthria and anarthria  Cognitive communication deficit    Problem List Patient Active Problem List   Diagnosis Date Noted  . Acute CVA (cerebrovascular accident) ([place]) 03/27/2020  . Sleep apnea   . Diabetes mellitus without complication ([place])   . COVID-19 long hauler   . Obesity   . Acute pulmonary embolism ([place]) 06/30/2019  . Pulmonary embolism ([place]) 05/2019  . Hypernatremia   . Asthma, chronic   . DKA (diabetic ketoacidoses) 11/27/2014  . Diabetes mellitus, new onset ([place])   . Hyperglycemic hyperosmolar nonketotic coma ([place])   . Essential hypertension     [place] ,[place], [place]  05/30/2020, 2:52 PM  [place] [address] [place], [name], [address] Phone: [phone]   Fax:  [phone]   Name: STARLENA BEIL MRN: [id number] Date of Birth: 23-Aug-1980

## 2020-06-01 ENCOUNTER — Encounter: Payer: 59 | Admitting: Occupational Therapy

## 2020-06-02 ENCOUNTER — Other Ambulatory Visit: Payer: Self-pay

## 2020-06-02 ENCOUNTER — Encounter: Payer: Self-pay | Admitting: Occupational Therapy

## 2020-06-02 ENCOUNTER — Ambulatory Visit: Payer: 59

## 2020-06-02 ENCOUNTER — Ambulatory Visit: Payer: 59 | Admitting: Occupational Therapy

## 2020-06-02 VITALS — BP 113/82 | HR 91

## 2020-06-02 DIAGNOSIS — I69354 Hemiplegia and hemiparesis following cerebral infarction affecting left non-dominant side: Secondary | ICD-10-CM

## 2020-06-02 DIAGNOSIS — M6281 Muscle weakness (generalized): Secondary | ICD-10-CM | POA: Diagnosis not present

## 2020-06-02 DIAGNOSIS — R41841 Cognitive communication deficit: Secondary | ICD-10-CM

## 2020-06-02 DIAGNOSIS — R2689 Other abnormalities of gait and mobility: Secondary | ICD-10-CM

## 2020-06-02 DIAGNOSIS — R471 Dysarthria and anarthria: Secondary | ICD-10-CM

## 2020-06-02 DIAGNOSIS — R278 Other lack of coordination: Secondary | ICD-10-CM

## 2020-06-02 DIAGNOSIS — R2681 Unsteadiness on feet: Secondary | ICD-10-CM

## 2020-06-02 DIAGNOSIS — R41842 Visuospatial deficit: Secondary | ICD-10-CM

## 2020-06-02 NOTE — Therapy (Signed)
[facility] [address] [place], [name], [address] Phone: [phone]   Fax:  [phone]  Physical Therapy Treatment  Patient Details  Name: Shelly Shields MRN: [id number] Date of Birth: [birth year] Referring Provider (PT): Phillips Climes referred (hospitalist) but PCP Salvadore Oxford   Encounter Date: 06/02/2020   PT End of Session - 06/02/20 1547    Visit Number 8    Number of Visits 9    Authorization Type [employer]    PT Start Time 8299    PT Stop Time 1540    PT Time Calculation (min) 47 min    Equipment Utilized During Treatment Gait belt    Activity Tolerance Patient tolerated treatment well    Behavior During Therapy [facility] for tasks assessed/performed;Flat affect           Past Medical History:  Diagnosis Date  . Asthma   . COVID-19 long hauler   . Diabetes mellitus without complication ([place])   . Hypertension   . Migraines   . Obesity   . Pulmonary embolism ([place]) 05/2019   with COVID  . Sleep apnea     Past Surgical History:  Procedure Laterality Date  . BILATERAL CARPAL TUNNEL RELEASE    . TEE WITHOUT CARDIOVERSION N/A 03/28/2020   Procedure: TRANSESOPHAGEAL ECHOCARDIOGRAM (TEE);  Surgeon: Adrian Prows, MD;  Location: [facility];  Service: Cardiovascular;  Laterality: N/A;    Vitals:   06/02/20 1509 06/02/20 1513 06/02/20 1546  BP: (!) 132/97 109/83 113/82  Pulse: (!) 103 88 91     Subjective Assessment - 06/02/20 1456    Subjective Patient reports no new changes. No falls.    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 PMH HTN HLD DM2 obese OSA on cpap COVID with pulmonary embolism vaculitis    Limitations Lifting;House hold activities    Currently in Pain? No/denies    Pain Onset More than a month ago                             [facility] Adult PT  Treatment/Exercise - 06/02/20 0001      Ambulation/Gait   Ambulation/Gait Yes    Ambulation/Gait Assistance 5: Supervision    Ambulation/Gait Assistance Details see below, gait on treadmill for endurance training    Assistive device None    Gait Pattern Step-through pattern    Ambulation Surface Level;Indoor    Gait Comments Completed ambulation on treadmill in intervals. Completed ambulation x 2 minutes on 1.2 mph, completed ambulation x 5 minutes on 1.3 mph. HR: 103. BP; 132/97. Patient reported SOB 6/10 at end of completion.      High Level Balance   High Level Balance Activities Backward walking;Marching forwards;Tandem walking;Other (comment)   [place]   High Level Balance Comments Completed the following activites listed above, completed x 2 laps down and back in therapy gym. With tandem walking completed forward followed by backwards, increased challenge with backwards requiring CGA from PT. Mild fatigue after completion.      Neuro Re-ed    Neuro Re-ed Details  BOSU activities in P bars including tandem standing, cerv f/e, R/L rot providing SBA/CGA. Advanced to single leg A/P stepping and M/L stepovers on domed BOSU.  10 reps each activity with SBA/GA.  Progressed to toe tapsand step over susing 4 cones  in p bars 3 bouts.  Added wobble board standing with ballcath/toss in A/P and M/L.                    PT Short Term Goals - 05/09/20 1152      PT SHORT TERM GOAL #1   Title Pt will be independent with initial HEP.    Baseline reports independence    Time 4    Period Weeks    Status Achieved    Target Date 05/09/20      PT SHORT TERM GOAL #2   Title Pt will increase FGA to >/= 25/30 for improved functional balance.    Baseline 21/30 04/11/20; 24/30 on 12/14    Time 4    Period Weeks    Status On-going    Target Date 05/09/20      PT SHORT TERM GOAL #3   Title Pt will improve gait speed to 0.7 m/s for improved access to community.    Baseline 0.6 m/s 04/11/20;  1.07 m/s    Time 4    Period Weeks    Status Achieved    Target Date 05/09/20      PT SHORT TERM GOAL #4   Title Pt will be able to ambulate 500+ feet with <4 RPE with no AD and supervsion.    Baseline 580 ft no AD supervision, RPE 7/10    Time 4    Period Weeks    Status Partially Met    Target Date 05/09/20             PT Long Term Goals - 04/19/20 1553      PT LONG TERM GOAL #1   Title Pt will be independent with progressive HEP.    Baseline NA    Time 8    Period Weeks    Status New      PT LONG TERM GOAL #2   Title Pt will increase FOTO from 68 to >/= 80.    Baseline 68 04/11/20    Time 8    Period Weeks    Status New      PT LONG TERM GOAL #3   Title Pt will be able to ambulate 1,000+ feet with <4 RPE with no AD and supervsion.    Baseline NT    Time 8    Period Weeks    Status New      PT LONG TERM GOAL #4   Title Patient will be able to complete >/= 1,000 ft with test to demonstrate improved endurance.    Baseline 846    Time 8    Period Weeks    Status Revised      PT LONG TERM GOAL #5   Title Pt will score >27 on FGA for improved dynamic balance.    Baseline 21/30 04/11/20    Time 8    Period Weeks    Status New                 Plan - 06/02/20 1548    Clinical Impression Statement Continued endurance/gait training on treadmill today continue to work toward improved ambulation time and speed, RPE at end 6/10. Required seated break after to allow for BP to return to baseline. Continued high level balance activites with increased challenge on BOSU. Will continue to progress toward all LTGs and continue per POC.    Personal Factors and Comorbidities Past/Current Experience;Comorbidity 3+    Comorbidities PMH: HTN  HLD DM2 obese OSA on cpap COVID with pulmonary embolism vaculitis    Examination-Activity Limitations Squat;Stairs;Locomotion Level;Lift    Examination-Participation Restrictions Occupation;Community Activity     Stability/Clinical Decision Making Evolving/Moderate complexity    Rehab Potential Good    PT Frequency 1x / week   plus eval   PT Duration 8 weeks    PT Treatment/Interventions Gait training;Stair training;Functional mobility training;Therapeutic activities;Therapeutic exercise;Balance training;Neuromuscular re-education;Patient/family education;ADLs/Self Care Home Management;Visual/perceptual remediation/compensation;Energy conservation;Vestibular;Manual techniques    PT Next Visit Plan Plan to check all LTGs and planned discharge. Update HEP upon discharge. Continue high level balance/strengthening. Scifit/Nustep for endurance. Complete gait training (land/treadmill) focused on improved distance    Consulted and Agree with Plan of Care Patient           Patient will benefit from skilled therapeutic intervention in order to improve the following deficits and impairments:  Abnormal gait,Obesity,Decreased strength,Decreased balance,Improper body mechanics,Impaired vision/preception,Impaired perceived functional ability,Difficulty walking,Decreased coordination,Cardiopulmonary status limiting activity,Decreased activity tolerance,Decreased endurance  Visit Diagnosis: Muscle weakness (generalized)  Other abnormalities of gait and mobility  Unsteadiness on feet     Problem List Patient Active Problem List   Diagnosis Date Noted  . Acute CVA (cerebrovascular accident) ([place]) 03/27/2020  . Sleep apnea   . Diabetes mellitus without complication ([place])   . COVID-19 long hauler   . Obesity   . Acute pulmonary embolism ([place]) 06/30/2019  . Pulmonary embolism ([place]) 05/2019  . Hypernatremia   . Asthma, chronic   . DKA (diabetic ketoacidoses) 11/27/2014  . Diabetes mellitus, new onset ([place])   . Hyperglycemic hyperosmolar nonketotic coma ([place])   . Essential hypertension     Jones Bales, PT, DPT 06/02/2020, 3:50 PM  [facility] [address] [place], [name], [address] Phone: [phone]   Fax:  [phone]  Name: JAQUELYNN WANAMAKER MRN: [id number] Date of Birth: December 30, 1980

## 2020-06-02 NOTE — Therapy (Signed)
[facility] [address] [place], [place], [address] Phone: [phone]   Fax:  [phone]  Occupational Therapy Treatment  Patient Details  Name: Shelly Shields MRN: [id number] Date of Birth: 01-23-1981 Referring Provider (OT): [facility] - referred - will send reports to PCP per patient's request - Dr Arlan Organ   Encounter Date: 06/02/2020   OT End of Session - 06/02/20 1414    Visit Number 14    Number of Visits 17    Date for OT Re-Evaluation 06/25/20    Authorization Type 60 combined visits OT/PT/SLP [facility])    Authorization Time Period week 7 of 8 (06/02/20)    OT Start Time 1413   late getting out of ST   OT Stop Time 1449    OT Time Calculation (min) 36 min    Activity Tolerance Patient tolerated treatment well    Behavior During Therapy [facility] for tasks assessed/performed;Flat affect           Past Medical History:  Diagnosis Date  . Asthma   . COVID-19 long hauler   . Diabetes mellitus without complication ([facility])   . Hypertension   . Migraines   . Obesity   . Pulmonary embolism ([facility]) 05/2019   with COVID  . Sleep apnea     Past Surgical History:  Procedure Laterality Date  . BILATERAL CARPAL TUNNEL RELEASE    . TEE WITHOUT CARDIOVERSION N/A 03/28/2020   Procedure: TRANSESOPHAGEAL ECHOCARDIOGRAM (TEE);  Surgeon: Yates Decamp, MD;  Location: [facility] ENDOSCOPY;  Service: Cardiovascular;  Laterality: N/A;    There were no vitals filed for this visit.   Subjective Assessment - 06/02/20 1417    Subjective  I see the doctor next week for driving and back to work.    Pertinent History Covid, Migraines, DM, Sleep apnea    Currently in Pain? No/denies                        OT Treatments/Exercises (OP) - 06/02/20 1423      Cognitive Exercises   Problem Solving functional problem solving with scheduling out a day - increased time required for task and had 1 error with forgetting to pick  up ice cream cake.    Sequencing Skills prob solving and sequencing with game of [employer] on ipad. Pt required increased time and no adidtional assistance for beginning levels. Pt did well with task and worked well sequencing to reduce number of steps t free the trapped car. Sequencing common tasks on ipad on [facility] this day with 89% accuracy.    Basic Math [facility] everyday math level 2 with 80% accuracy. Difficulty with attention to detail and pt reported most difficulty with "balancing a checkbook" as this is not an everyday occurance for her.                    OT Short Term Goals - 05/30/20 1745      OT SHORT TERM GOAL #1   Title Patient will complete HEP designed to improve coordiantion in non dominant LUE    Period Weeks    Status Achieved    Target Date 05/26/20      OT SHORT TERM GOAL #2   Title Patient will type three full sentences using two hands on keyboard in less than 5 minutes    Time 4    Period Weeks    Status Achieved  OT SHORT TERM GOAL #3   Title Patient will navigate through empty hallway in familiar env't without hitting any obstacles placed in left field    Time 4    Period Weeks    Status Achieved      OT SHORT TERM GOAL #4   Title Patient will prepare a simple, familiar hot dish using stove top or oven with minimal cueing    Time 4    Period Weeks    Status Achieved      OT SHORT TERM GOAL #5   Title Patient will demonstrate 3lb increase in grip strength in BUE's    Time 4    Period Weeks    Status Achieved             OT Long Term Goals - 05/30/20 1745      OT LONG TERM GOAL #1   Title Patient will complete an updated HEP to address overall upper body strength and conditioning    Time 8    Period Weeks    Status On-going      OT LONG TERM GOAL #2   Title Patient will demonstrate a 5 lb increase in BUE strength to aide in opening packages and carrying items    Time 8    Period Weeks    Status On-going    LUE 41.6 RUE 69.8     OT LONG TERM GOAL #3   Title Patient will demonstrate a 5 sec reduction in time on 9 hole peg test to aide in 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    Time 8    Period Weeks    Status Achieved      OT LONG TERM GOAL #4   Title Patient will demonstrate understanding of recommendations relating to returning to driving    Time 8    Period Weeks    Status On-going      OT LONG TERM GOAL #5   Title Patient will demonstrate understanding of recommendations relating to return to work    Time 8    Period Weeks    Status On-going                 Plan - 06/02/20 1421    Clinical Impression Statement Pt continues to progress towards goals. Pt working towards increasing problem solving and overall cognitive abilities for back to work.    OT Occupational Profile and History Detailed Assessment- Review of Records and additional review of physical, cognitive, psychosocial history related to current functional performance    Occupational performance deficits (Please refer to evaluation for details): ADL's;IADL's;Work;Leisure    Body Structure / Function / Physical Skills ADL;Coordination;Endurance;UE functional use;Balance;Decreased knowledge of use of DME;Flexibility;IADL;Vision;Dexterity;FMC;Strength    Cognitive Skills Attention;Emotional;Energy/Drive;Memory;Problem Solve;Sequencing;Safety Awareness    Rehab Potential Good    Clinical Decision Making Several treatment options, min-mod task modification necessary    Modification or Assistance to Complete Evaluation  Min-Moderate modification of tasks or assist with assess necessary to complete eval    OT Frequency 2x / week    OT Duration 8 weeks    OT Treatment/Interventions Self-care/ADL training;Therapeutic exercise;Visual/perceptual remediation/compensation;Patient/family education;Neuromuscular education;Therapeutic activities;Balance  training;DME and/or AE instruction;Cognitive remediation/compensation;Energy conservation;Functional Mobility Training    Plan renewal due next week (week of 1/10) or discharge, functional problem solving and everyday functions    Consulted and Agree with Plan of Care Patient  Patient will benefit from skilled therapeutic intervention in order to improve the following deficits and impairments:   Body Structure / Function / Physical Skills: ADL,Coordination,Endurance,UE functional use,Balance,Decreased knowledge of use of DME,Flexibility,IADL,Vision,Dexterity,FMC,Strength Cognitive Skills: Attention,Emotional,Energy/Drive,Memory,Problem Solve,Sequencing,Safety Awareness     Visit Diagnosis: Visuospatial deficit  Other lack of coordination  Hemiplegia and hemiparesis following cerebral infarction affecting left non-dominant side [facility])    Problem List Patient Active Problem List   Diagnosis Date Noted  . Acute CVA (cerebrovascular accident) ([place]) 03/27/2020  . Sleep apnea   . Diabetes mellitus without complication ([place])   . COVID-19 long hauler   . Obesity   . Acute pulmonary embolism ([place]) 06/30/2019  . Pulmonary embolism ([place]) 05/2019  . Hypernatremia   . Asthma, chronic   . DKA (diabetic ketoacidoses) 11/27/2014  . Diabetes mellitus, new onset ([place])   . Hyperglycemic hyperosmolar nonketotic coma ([place])   . Essential hypertension     Zachery Conch MOT, OTR/L  06/02/2020, 2:54 PM  [facility] [address] [place], [name], [address] Phone: [phone]   Fax:  [phone]  Name: Shelly Shields MRN: [id number] Date of Birth: [birth year]

## 2020-06-04 NOTE — Therapy (Signed)
[facility] [address] [place], [name], [address] Phone: [phone]   Fax:  [phone]  Speech Language Pathology Treatment  Patient Details  Name: Shelly Shields MRN: [id number] Date of Birth: Apr 19, 1981 Referring Provider (SLP): (ref) Dr. Arlyn Leak, (Documentation PCP) Salvadore Oxford, MD   Encounter Date: 06/02/2020   End of Session - 06/04/20 1618    Visit Number 6    Number of Visits 17    Date for SLP Re-Evaluation 08/07/20    Authorization - Visit Number 6    Authorization - Number of Visits 66    SLP Start Time 3664    SLP Stop Time  1410    SLP Time Calculation (min) 45 min    Activity Tolerance Patient tolerated treatment well           Past Medical History:  Diagnosis Date  . Asthma   . COVID-19 long hauler   . Diabetes mellitus without complication ([place])   . Hypertension   . Migraines   . Obesity   . Pulmonary embolism ([place]) 05/2019   with COVID  . Sleep apnea     Past Surgical History:  Procedure Laterality Date  . BILATERAL CARPAL TUNNEL RELEASE    . TEE WITHOUT CARDIOVERSION N/A 03/28/2020   Procedure: TRANSESOPHAGEAL ECHOCARDIOGRAM (TEE);  Surgeon: Adrian Prows, MD;  Location: [facility];  Service: Cardiovascular;  Laterality: N/A;    There were no vitals filed for this visit.          ADULT SLP TREATMENT - 06/04/20 0001      General Information   Behavior/Cognition Alert;Cooperative;Pleasant mood      Treatment Provided   Treatment provided Cognitive-Linquistic      Cognitive-Linquistic Treatment   Treatment focused on Cognition    Skilled Treatment Pt states first day of her 90 minute focus of slower speech was challenging but it made ther think of slowed rate more often during the day, outside of those specific times. In a detailed cognitive task, pt again req'd SLP cues to double check her answers. She found 2 errors and corrected them and then handed to SLP  who found another error. SLP reiterated some practical situations at home in her daily routine that may require her to stop and double check things prior to leaving the room - or leaving the house, etc. SLP provided pt another detailed task and pt spontaneously double checked her work - found two errors.      Assessment / Recommendations / Plan   Plan Continue with current plan of care      Progression Toward Goals   Progression toward goals Progressing toward goals            SLP Education - 06/04/20 1617    Education Details need to double check work more often, practical situations where she may have to check work    [employer]) Educated Patient    Methods Explanation    Comprehension Verbalized understanding            SLP Short Term Goals - 06/04/20 1623      SLP SHORT TERM GOAL #1   Title pt will perform dysarhthria HEP with rare min x2 consecutive sessions    Time 1    Period Weeks   or 9 total sessions   Status Partially Met      SLP SHORT TERM GOAL #2   Title pt will complete aphasia testing PRN and goals added PRN  Status Deferred      SLP SHORT TERM GOAL #3   Title pt will undergo cognitive communication testing and goals added PRN    Status Achieved      SLP SHORT TERM GOAL #4   Title pt will use compensatory measures for speech intelligibilty of 95%+ in 10 minutes simple conversation x3 sessions    Time 1    Period Weeks    Status Not Met      SLP SHORT TERM GOAL #5   Title pt will demo awareness of errors, given self correction, in simple cognitive linguistic tasks in 3 sessions    Time 1    Period Weeks    Status Not Met      SLP SHORT TERM GOAL #6   Title pt will demonstrate appropriate compensatory measures for decr'd attention skills in simple cognitve linguistic tasks    Time 1    Period Weeks    Status Deferred            SLP Long Term Goals - 06/04/20 1624      SLP LONG TERM GOAL #1   Title pt will use compensatory measures for  speech intelligibilty of 95%+ in 10 minutes mod complex conversation, using self correction PRN x3 sessions    Time 5    Period Weeks   or 17 total sessions, for all LTGs   Status On-going      SLP LONG TERM GOAL #2   Title pt will use compensations for speech intelligiblity in 19/20 sentence responses resulting in 100% intelligbility in 3 sessions    Time 5    Period Weeks    Status On-going      SLP LONG TERM GOAL #3   Title pt will demonstrate appropriate anticipatory awareness in mod complex cognitive linguistic tasks by spontaneously self-correcting or double checking work 100% of the time in 3 sessions    Time 5    Period Weeks    Status On-going      SLP LONG TERM GOAL #4   Title pt will have 100% accuracy in simultaneous communicative tasks (simple divided attention) or make compensations for situations where she would have normally performed divided attention, in 3 sessions    Time 5    Period Weeks    Status On-going      SLP LONG TERM GOAL #5   Title in her last 1-2 visits, pt will demonstrate improved QOL score (</= 80) on speech QOL measure    Time 5    Period Weeks    Status On-going            Plan - 06/04/20 1623    Clinical Impression Statement 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 Skilled ST is necessary for pt to improve speech clarity and cognition, as well as any lingering expressive aphasia.    Speech Therapy Frequency 2x / week    Duration 8 weeks   17 visits   Treatment/Interventions Language  facilitation;Cueing hierarchy;Cognitive reorganization;Internal/external aids;Patient/family education;Compensatory strategies;SLP instruction and feedback;Multimodal communcation approach;Functional tasks;Oral motor exercises  Potential to Achieve Goals Good           Patient will benefit from skilled therapeutic intervention in order to improve the following deficits and impairments:   Dysarthria and anarthria  Cognitive communication deficit    Problem List Patient Active Problem List   Diagnosis Date Noted  . Acute CVA (cerebrovascular accident) ([place]) 03/27/2020  . Sleep apnea   . Diabetes mellitus without complication ([place])   . COVID-19 long hauler   . Obesity   . Acute pulmonary embolism ([place]) 06/30/2019  . Pulmonary embolism ([place]) 05/2019  . Hypernatremia   . Asthma, chronic   . DKA (diabetic ketoacidoses) 11/27/2014  . Diabetes mellitus, new onset ([place])   . Hyperglycemic hyperosmolar nonketotic coma ([place])   . Essential hypertension     , ,MS, CCC-SLP  06/04/2020, 4:25 PM  [facility] [address] [place], [name], [address] Phone: [phone]   Fax:  [phone]   Name: Shelly Shields MRN: [id number] Date of Birth: 26-Jun-1980

## 2020-06-06 ENCOUNTER — Ambulatory Visit: Payer: 59 | Admitting: Occupational Therapy

## 2020-06-07 ENCOUNTER — Ambulatory Visit: Payer: 59

## 2020-06-07 ENCOUNTER — Other Ambulatory Visit: Payer: Self-pay

## 2020-06-07 DIAGNOSIS — I69354 Hemiplegia and hemiparesis following cerebral infarction affecting left non-dominant side: Secondary | ICD-10-CM

## 2020-06-07 DIAGNOSIS — M6281 Muscle weakness (generalized): Secondary | ICD-10-CM

## 2020-06-07 DIAGNOSIS — R2681 Unsteadiness on feet: Secondary | ICD-10-CM

## 2020-06-07 DIAGNOSIS — R2689 Other abnormalities of gait and mobility: Secondary | ICD-10-CM

## 2020-06-07 NOTE — Therapy (Signed)
[facility] [address] [place], [name], [address] Phone: [phone]   Fax:  [phone]  Physical Therapy Treatment/Discharge Summary  Patient Details  Name: Shelly Shields MRN: [id number] Date of Birth: 12-Dec-1980 Referring Provider (PT): Phillips Climes referred (hospitalist) but PCP [facility]  Visits from Start of Care: 9  Current functional level related to goals / functional outcomes: See Clinical Impression statement for details.    Remaining deficits: Decreased Endurance   Education / Equipment: Educated on [employer]  Plan: Patient agrees to discharge.  Patient goals were partially met. Patient is being discharged due to meeting the stated rehab goals.  ?????       Encounter Date: 06/07/2020   PT End of Session - 06/07/20 1233    Visit Number 9    Number of Visits 9    Authorization Type [employer]    PT Start Time 1232    PT Stop Time 1313    PT Time Calculation (min) 41 min    Equipment Utilized During Treatment Gait belt    Activity Tolerance Patient tolerated treatment well    Behavior During Therapy WFL for tasks assessed/performed;Flat affect           Past Medical History:  Diagnosis Date  . Asthma   . COVID-19 long hauler   . Diabetes mellitus without complication ([place])   . Hypertension   . Migraines   . Obesity   . Pulmonary embolism ([place]) 05/2019   with COVID  . Sleep apnea     Past Surgical History:  Procedure Laterality Date  . BILATERAL CARPAL TUNNEL RELEASE    . TEE WITHOUT CARDIOVERSION N/A 03/28/2020   Procedure: TRANSESOPHAGEAL ECHOCARDIOGRAM (TEE);  Surgeon: Adrian Prows, MD;  Location: [facility];  Service: Cardiovascular;  Laterality: N/A;    There were no vitals filed for this visit.   Subjective Assessment - 06/07/20 1235    Subjective Patient reports that doing well, no new changes. No falls.  Patient reports feeling comfortable wrapping things up today.    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 PMH HTN HLD DM2 obese OSA on cpap COVID with pulmonary embolism vaculitis    Limitations Lifting;House hold activities    Currently in Pain? No/denies              [facility] PT Assessment - 06/07/20 1236      Assessment   Medical Diagnosis Acute R MCA CVA    Referring Provider (PT) Dawood Elgergawy referred (hospitalist) but PCP Salvadore Oxford      Observation/Other Assessments   Focus on Therapeutic Outcomes (FOTO)  78%      6 Minute Walk- Baseline   6 Minute Walk- Baseline yes    BP (mmHg) 136/88    HR (bpm) 98    02 Sat (%RA) 100 %    Modified Borg Scale for Dyspnea 0- Nothing at all      6 Minute walk- Post Test   6 Minute Walk Post Test yes    BP (mmHg) 142/90    HR (bpm) 110    02 Sat (%RA) 100 %    Modified Borg Scale for Dyspnea 5- Strong or hard breathing    Perceived Rate of Exertion (Borg) 13- Somewhat hard      6 minute walk test results  Aerobic Endurance Distance Walked 1234    Endurance additional comments mild fatigue after completion.      Functional Gait  Assessment   Gait assessed  Yes    Gait Level Surface Walks 20 ft in less than 7 sec but greater than 5.5 sec, uses assistive device, slower speed, mild gait deviations, or deviates 6-10 in outside of the 12 in walkway width.    Change in Gait Speed Able to change speed, demonstrates mild gait deviations, deviates 6-10 in outside of the 12 in walkway width, or no gait deviations, unable to achieve a major change in velocity, or uses a change in velocity, or uses an assistive device.    Gait with Horizontal Head Turns Performs head turns smoothly with no change in gait. Deviates no more than 6 in outside 12 in walkway width    Gait with Vertical Head Turns Performs head turns with no  change in gait. Deviates no more than 6 in outside 12 in walkway width.    Gait and Pivot Turn Pivot turns safely within 3 sec and stops quickly with no loss of balance.    Step Over Obstacle Is able to step over 2 stacked shoe boxes taped together (9 in total height) without changing gait speed. No evidence of imbalance.    Gait with Narrow Base of Support Is able to ambulate for 10 steps heel to toe with no staggering.    Gait with Eyes Closed Walks 20 ft, no assistive devices, good speed, no evidence of imbalance, normal gait pattern, deviates no more than 6 in outside 12 in walkway width. Ambulates 20 ft in less than 7 sec.    Ambulating Backwards Walks 20 ft, uses assistive device, slower speed, mild gait deviations, deviates 6-10 in outside 12 in walkway width.    Steps Alternating feet, no rail.    Total Score 27    FGA comment: 27/30                         [facility] Adult PT Treatment/Exercise - 06/07/20 0001      Ambulation/Gait   Ambulation/Gait Yes    Ambulation/Gait Assistance 6: Modified independent (Device/Increase time)    Ambulation/Gait Assistance Details with completion of , see assessment tab      Exercises   Exercises Other Exercises    Other Exercises  Verbal review to ensure no questions/concerns with current HEP. Encouraging patient to continue completion upn discharge.                  PT Education - 06/07/20 1344    Education Details educated on progress toward LTG; encourage to continue HEP upon discharge    Person(s) Educated Patient    Methods Explanation    Comprehension Verbalized understanding            PT Short Term Goals - 06/07/20 1318      PT SHORT TERM GOAL #1   Title Pt will be independent with initial HEP.    Baseline reports independence    Time 4    Period Weeks    Status Achieved    Target Date 05/09/20      PT SHORT TERM GOAL #2   Title Pt will increase FGA to >/= 25/30 for improved functional balance.     Baseline 21/30 04/11/20; 24/30 on 12/14    Time 4    Period Weeks    Status On-going    Target Date  05/09/20      PT SHORT TERM GOAL #3   Title Pt will improve gait speed to 0.7 m/s for improved access to community.    Baseline 0.6 m/s 04/11/20; 1.07 m/s    Time 4    Period Weeks    Status Achieved    Target Date 05/09/20      PT SHORT TERM GOAL #4   Title Pt will be able to ambulate 500+ feet with <4 RPE with no AD and supervsion.    Baseline 580 ft no AD supervision, RPE 7/10    Time 4    Period Weeks    Status Partially Met    Target Date 05/09/20             PT Long Term Goals - 06/07/20 1244      PT LONG TERM GOAL #1   Title Pt will be independent with progressive HEP.    Baseline Patient reports independence/complaint with final HEP    Time 8    Period Weeks    Status Achieved      PT LONG TERM GOAL #2   Title Pt will increase FOTO from 68 to >/= 80.    Baseline 68 04/11/20; 78% on 06/07/20    Time 8    Period Weeks    Status Not Met      PT LONG TERM GOAL #3   Title Pt will be able to ambulate 1,000+ feet with <4 RPE with no AD and supervsion.    Baseline 06/07/20: 1234 ft, RPE 6/10    Time 8    Period Weeks    Status Partially Met      PT LONG TERM GOAL #4   Title Patient will be able to complete >/= 1,000 ft with 6MWT test to demonstrate improved endurance.    Baseline 1234 ft on 1/12    Time 8    Period Weeks    Status Achieved      PT LONG TERM GOAL #5   Title Pt will score >27 on FGA for improved dynamic balance.    Baseline 21/30 04/11/20; 27/30 on 1/12    Time 8    Period Weeks    Status Achieved                 Plan - 06/07/20 1320    Clinical Impression Statement Completed assessment of patient's progress toward all LTGs. Patient able to meet all LTG today except LTG #2. Patient demonstrating improvements in balance and reduced fall risk with FGA score of 27/30. Patient also demonstrating improved endurance with ability to  ambulate 1234 ft with 6MWT. Patient continues to report increased fatigue/exertion level upon completion of ambulation, but believe is due to asthma. Patient reporting feeling ready for discharge at this time, with PT verbalizing agreement. PT educating to continue HEP and walking program upon discharge.    Personal Factors and Comorbidities Past/Current Experience;Comorbidity 3+    Comorbidities PMH: HTN HLD DM2 obese OSA on cpap COVID with pulmonary embolism vaculitis    Examination-Activity Limitations Squat;Stairs;Locomotion Level;Lift    Examination-Participation Restrictions Occupation;Community Activity    Stability/Clinical Decision Making Evolving/Moderate complexity    Rehab Potential Good    PT Frequency 1x / week   plus eval   PT Duration 8 weeks    PT Treatment/Interventions Gait training;Stair training;Functional mobility training;Therapeutic activities;Therapeutic exercise;Balance training;Neuromuscular re-education;Patient/family education;ADLs/Self Care Home Management;Visual/perceptual remediation/compensation;Energy conservation;Vestibular;Manual techniques    PT Next Visit Plan --  Consulted and Agree with Plan of Care Patient           Patient will benefit from skilled therapeutic intervention in order to improve the following deficits and impairments:  Abnormal gait,Obesity,Decreased strength,Decreased balance,Improper body mechanics,Impaired vision/preception,Impaired perceived functional ability,Difficulty walking,Decreased coordination,Cardiopulmonary status limiting activity,Decreased activity tolerance,Decreased endurance  Visit Diagnosis: Muscle weakness (generalized)  Hemiplegia and hemiparesis following cerebral infarction affecting left non-dominant side (HCC)  Other abnormalities of gait and mobility  Unsteadiness on feet     Problem List Patient Active Problem List   Diagnosis Date Noted  . Acute CVA (cerebrovascular accident) ([place]) 03/27/2020  .  Sleep apnea   . Diabetes mellitus without complication ([place])   . COVID-19 long hauler   . Obesity   . Acute pulmonary embolism ([place]) 06/30/2019  . Pulmonary embolism ([place]) 05/2019  . Hypernatremia   . Asthma, chronic   . DKA (diabetic ketoacidoses) 11/27/2014  . Diabetes mellitus, new onset ([place])   . Hyperglycemic hyperosmolar nonketotic coma ([place])   . Essential hypertension     Jones Bales, PT, DPT 06/07/2020, 1:51 PM  [facility] [address] [place], [name], [address] Phone: [phone]   Fax:  [phone]  Name: Shelly Shields MRN: [id number] Date of Birth: [birth year]

## 2020-06-08 ENCOUNTER — Telehealth: Payer: Self-pay | Admitting: Student

## 2020-06-08 ENCOUNTER — Ambulatory Visit: Payer: 59 | Admitting: Occupational Therapy

## 2020-06-08 ENCOUNTER — Encounter: Payer: Self-pay | Admitting: Occupational Therapy

## 2020-06-08 DIAGNOSIS — R278 Other lack of coordination: Secondary | ICD-10-CM

## 2020-06-08 DIAGNOSIS — R41842 Visuospatial deficit: Secondary | ICD-10-CM

## 2020-06-08 DIAGNOSIS — R2681 Unsteadiness on feet: Secondary | ICD-10-CM

## 2020-06-08 DIAGNOSIS — M6281 Muscle weakness (generalized): Secondary | ICD-10-CM | POA: Diagnosis not present

## 2020-06-08 DIAGNOSIS — I69354 Hemiplegia and hemiparesis following cerebral infarction affecting left non-dominant side: Secondary | ICD-10-CM

## 2020-06-08 LAB — BASIC METABOLIC PANEL
BUN/Creatinine Ratio: 13 (ref 9–23)
BUN: 12 mg/dL (ref 6–24)
CO2: 21 mmol/L (ref 20–29)
Calcium: 9.2 mg/dL (ref 8.7–10.2)
Chloride: 110 mmol/L — ABNORMAL HIGH (ref 96–106)
Creatinine, Ser: 0.96 mg/dL (ref 0.57–1.00)
GFR calc Af Amer: 86 mL/min/{1.73_m2} (ref >59)
GFR calc non Af Amer: 74 mL/min/{1.73_m2} (ref >59)
Glucose: 116 mg/dL — ABNORMAL HIGH (ref 65–99)
Potassium: 4 mmol/L (ref 3.5–5.2)
Sodium: 143 mmol/L (ref 134–144)

## 2020-06-08 NOTE — Patient Instructions (Signed)
Local Driver Evaluation Programs: ° °Comprehensive Evaluation: includes clinical and in vehicle behind the wheel testing by OCCUPATIONAL THERAPIST. Programs have varying levels of adaptive controls available for trial.  ° °[facility] °[address] °[place], [place]  [address] °[phone] or [phone] °http://www.driver-rehab.com °Evaluator:  Cyndee Crompton, OT/CDRS/CDI/SCDCM/Low Vision Certification ° °[facility] °[address] °[place], [place] [address] °[phone] °https://www.[facility].org/home/services/rehabilitation.aspx °Evaluators:  Shannon Sheek, OT and Jill Tucker, OT ° °[facility] [place] [facility] (ONLY SERVES VETERANS!!) °[facility] °[address] °[place], [place]  [address] °[phone] [phone] °http://www.[place].va.gov/services/Physical_Medicine_Rehabilitation_Services.asp °Evaluators:  Eric Andrews, KT; Heidi Harris, KT;  Gary Whitaker, KT (KT=kiniesotherapist) ° ° °Clinical evaluations only:  Includes clinical testing, refers to other programs or local certified driving instructor for behind the wheel testing. ° °[facility] (outpatient Rehab) °[facility] °[address] °[place], [place] [address] °[phone] for scheduling °http://www.[facility].edu/Outpatient-Rehabilitation/Neurorehabilitation-Therapy.htm °Evaluators:  Kelly Lambeth, OT; Kate Phillips, OT ° °Other area clinical evaluators available upon request including [facility], [facility] and [facility]. ° ° °    Resource List °What is a Driver Evaluation: °Your Road Ahead - A Guide to Comprehensive Driving Evaluations °http://www.thehartford.com/resources/mature-market-excellence/publications-on-aging ° °Association for [employer] Rehabilitation Services - Disability and Driving Fact Sheets °http://www.aded.net/?page=510 ° °Driving after a Brain  Injury: °[employer] °http://www.biausa.org/tbims-abstracts/if-there-is-an-effective-way-to-determine-if-someone-is-ready-to-drive-after-tbi?A=SearchResult&SearchID=9495675&ObjectID=2758842&ObjectType=35 ° °Driving with Adaptive Equipment: °[facility] Process °http://www.driver-rehab.com/adaptive-equipment ° °[employer] Mob[employer] °http://www.nmeda.com/ ° ° ° ° ° ° °  °

## 2020-06-08 NOTE — Therapy (Signed)
[facility] [address] [place], [name], [address] Phone: [phone]   Fax:  [phone]  Occupational Therapy Treatment  Patient Details  Name: Shelly Shields MRN: [id number] Date of Birth: 04/01/1981 Referring Provider (OT): [facility] - referred - will send reports to PCP per patient's request - Dr Salvadore Oxford   Encounter Date: 06/08/2020   OT End of Session - 06/08/20 1359    OT Start Time 6811    OT Stop Time 1344    OT Time Calculation (min) 27 min    Activity Tolerance Patient tolerated treatment well    Behavior During Therapy [facility] for tasks assessed/performed           Past Medical History:  Diagnosis Date  . Asthma   . COVID-19 long hauler   . Diabetes mellitus without complication ([place])   . Hypertension   . Migraines   . Obesity   . Pulmonary embolism ([place]) 05/2019   with COVID  . Sleep apnea     Past Surgical History:  Procedure Laterality Date  . BILATERAL CARPAL TUNNEL RELEASE    . TEE WITHOUT CARDIOVERSION N/A 03/28/2020   Procedure: TRANSESOPHAGEAL ECHOCARDIOGRAM (TEE);  Surgeon: Adrian Prows, MD;  Location: [facility];  Service: Cardiovascular;  Laterality: N/A;    There were no vitals filed for this visit.   Subjective Assessment - 06/08/20 1321    Subjective  I feel like I am maing decisions faster    Pertinent History Covid, Migraines, DM, Sleep apnea    Currently in Pain? No/denies    Pain Score 0-No pain              [facility] OT Assessment - 06/08/20 1328      Hand Function   Right Hand Grip (lbs) 73    Left Hand Grip (lbs) 57                    OT Treatments/Exercises (OP) - 06/08/20 0001      ADLs   ADL Comments Reviewed remaining OT goals.  Patient has met all goals and is agreeable to OT discharge.  Patient given instructions for how to access formal driving evaluation.  Patient feels she is unable to pay prvately for comprehensive eval.   Sevreal options given.  Discussed goal relating to return to work, patient is still seeing SLP services.  She will wait until she completes this program for decision regarding return to work, as remianing impairments are more cognitive and language based.                  OT Education - 06/08/20 1356    Education Details red theraband HEP for bilateral UE's, putty exercisess,  return to work considerations - e.g. after SLP, Partial days, Driver evaluation    Person(s) Educated Patient    Methods Explanation;Demonstration;Handout    Comprehension Verbalized understanding            OT Short Term Goals - 06/08/20 1358      OT SHORT TERM GOAL #1   Title Patient will complete HEP designed to improve coordiantion in non dominant LUE    Period Weeks    Status Achieved    Target Date 05/26/20      OT SHORT TERM GOAL #2   Title Patient will type three full sentences using two hands on keyboard in less than 5 minutes    Time 4    Period Weeks  Status Achieved      OT SHORT TERM GOAL #3   Title Patient will navigate through empty hallway in familiar env't without hitting any obstacles placed in left field    Time 4    Period Weeks    Status Achieved      OT SHORT TERM GOAL #4   Title Patient will prepare a simple, familiar hot dish using stove top or oven with minimal cueing    Time 4    Period Weeks    Status Achieved      OT SHORT TERM GOAL #5   Title Patient will demonstrate 3lb increase in grip strength in BUE's    Time 4    Period Weeks    Status Achieved             OT Long Term Goals - 06/08/20 1341      OT LONG TERM GOAL #1   Title Patient will complete an updated HEP to address overall upper body strength and conditioning    Time 8    Period Weeks    Status Achieved      OT LONG TERM GOAL #2   Title Patient will demonstrate a 5 lb increase in BUE strength to aide in opening packages and carrying items    Time 8    Period Weeks    Status Achieved    LUE 41.6 RUE 69.8     OT LONG TERM GOAL #3   Title Patient will demonstrate a 5 sec reduction in time on 9 hole peg test to aide in 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    Time 8    Period Weeks    Status Achieved      OT LONG TERM GOAL #4   Title Patient will demonstrate understanding of recommendations relating to returning to driving    Time 8    Period Weeks    Status Achieved      OT LONG TERM GOAL #5   Title Patient will demonstrate understanding of recommendations relating to return to work    Time 8    Period Weeks    Status Deferred                 Plan - 06/08/20 1357    Clinical Impression Statement Patient has met OT goals and is agreeable to discharge    OT Occupational Profile and History Detailed Assessment- Review of Records and additional review of physical, cognitive, psychosocial history related to current functional performance    Occupational performance deficits (Please refer to evaluation for details): ADL's;IADL's;Work;Leisure    Body Structure / Function / Physical Skills ADL;Coordination;Endurance;UE functional use;Balance;Decreased knowledge of use of DME;Flexibility;IADL;Vision;Dexterity;FMC;Strength    Cognitive Skills Attention;Emotional;Energy/Drive;Memory;Problem Solve;Sequencing;Safety Awareness    Rehab Potential Good    Clinical Decision Making Several treatment options, min-mod task modification necessary    Modification or Assistance to Complete Evaluation  Min-Moderate modification of tasks or assist with assess necessary to complete eval    OT Frequency 2x / week    OT Duration 8 weeks    OT Treatment/Interventions Self-care/ADL training;Therapeutic exercise;Visual/perceptual remediation/compensation;Patient/family education;Neuromuscular education;Therapeutic activities;Balance training;DME and/or AE instruction;Cognitive remediation/compensation;Energy  conservation;Functional Mobility Training    Plan discharge    Consulted and Agree with Plan of Care Patient           Patient will benefit from skilled therapeutic intervention in order to improve the  following deficits and impairments:   Body Structure / Function / Physical Skills: ADL,Coordination,Endurance,UE functional use,Balance,Decreased knowledge of use of DME,Flexibility,IADL,Vision,Dexterity,FMC,Strength Cognitive Skills: Attention,Emotional,Energy/Drive,Memory,Problem Solve,Sequencing,Safety Awareness     Visit Diagnosis: Hemiplegia and hemiparesis following cerebral infarction affecting left non-dominant side (HCC)  Muscle weakness (generalized)  Other lack of coordination  Unsteadiness on feet  Visuospatial deficit    Problem List Patient Active Problem List   Diagnosis Date Noted  . Acute CVA (cerebrovascular accident) ([place]) 03/27/2020  . Sleep apnea   . Diabetes mellitus without complication ([place])   . COVID-19 long hauler   . Obesity   . Acute pulmonary embolism ([place]) 06/30/2019  . Pulmonary embolism ([place]) 05/2019  . Hypernatremia   . Asthma, chronic   . DKA (diabetic ketoacidoses) 11/27/2014  . Diabetes mellitus, new onset ([place])   . Hyperglycemic hyperosmolar nonketotic coma ([place])   . Essential hypertension    OCCUPATIONAL THERAPY DISCHARGE SUMMARY  Visits from Start of Care: 15  Current functional level related to goals / functional outcomes: Improved strength, coordination, visual   Remaining deficits: Language, cognition   Education / Equipment: HEP, driving,  Plan: Patient agrees to discharge.  Patient goals were met. Patient is being discharged due to meeting the stated rehab goals.  ?????     Mariah Milling , OTR/L 06/08/2020, 2:00 PM  [facility] [address] [place], [name], [address] Phone: [phone]   Fax:  [phone]  Name: Shelly Shields MRN:  [id number] Date of Birth: 1980/12/11

## 2020-06-09 NOTE — Telephone Encounter (Signed)
Called patient and discussed lab results, informed her that renal function is stable. She is tolerating increased Entresto well and has been tracking blood pressure and heart rate. She noted about 3 days ago that her heart rate was still consistently >70 bpm, she therefore increased carvedilol from 12.5 mg to 25 mg twice daily, she is tolerating this well. Will follow up as previously scheduled.

## 2020-06-09 NOTE — Progress Notes (Signed)
Call patient, she is aware of results.

## 2020-06-13 ENCOUNTER — Ambulatory Visit: Payer: 59 | Admitting: Occupational Therapy

## 2020-06-14 ENCOUNTER — Ambulatory Visit: Payer: 59 | Admitting: Speech Pathology

## 2020-06-14 ENCOUNTER — Other Ambulatory Visit: Payer: Self-pay

## 2020-06-14 DIAGNOSIS — M6281 Muscle weakness (generalized): Secondary | ICD-10-CM | POA: Diagnosis not present

## 2020-06-14 DIAGNOSIS — R41841 Cognitive communication deficit: Secondary | ICD-10-CM

## 2020-06-14 DIAGNOSIS — R471 Dysarthria and anarthria: Secondary | ICD-10-CM

## 2020-06-14 DIAGNOSIS — R4701 Aphasia: Secondary | ICD-10-CM

## 2020-06-14 NOTE — Patient Instructions (Signed)
   Practice recalling 3-5 random words (1-3 syllables)  Practice naming road signs and review driving test - find online practice test for [place]    Checkers [employer] games Jig saw puzzles Easy cross words Memory match Board games [employer] Learn a new game!  Listen to and discuss Ted Talks or Podcasts Read and discuss short articles of interest to you- Take notes on these if memory is a challenge Discuss social media posts Keep up with e mails   The best activities to improve cognition are functional, real life activities that are important to you:  Plan a menu Participate in household chores and decisions (with supervision) Participate in managing finances Plan a party, trip or tailgate with all of the details (even if you aren't really going to carry it out) Participate in your hobby as you are able with assistance Manage your texts, emails with supervision if needed. Google search for items (even if you're not really going to buy anything) and compare prices and features Socialize -  however, too many visitors can be overwhelming, so set limits "My doctor said I should only visit (or talk) for 20 minutes" or "I do better when I visit with just 1-2 people at a time for 20 minutes"    It's good to use real in-person games, not just apps  Apps:  [employer] Elevate There are apps for most of the games listed above

## 2020-06-15 ENCOUNTER — Telehealth: Payer: Self-pay

## 2020-06-15 ENCOUNTER — Ambulatory Visit: Payer: 59 | Admitting: Occupational Therapy

## 2020-06-15 NOTE — Therapy (Signed)
[facility] [address] [place], [name], [address] Phone: [phone]   Fax:  [phone]  Speech Language Pathology Treatment  Patient Details  Name: Shelly Shields MRN: [id number] Date of Birth: 02/27/1981 Referring Provider (SLP): (ref) Dr. Arlyn Leak, (Documentation PCP) Salvadore Oxford, MD   Encounter Date: 06/14/2020   End of Session - 06/15/20 1527    Visit Number 7    Number of Visits 17    Date for SLP Re-Evaluation 08/07/20    Authorization - Visit Number 7    Authorization - Number of Visits 44    SLP Start Time 1517    SLP Stop Time  1445    SLP Time Calculation (min) 40 min    Activity Tolerance Patient tolerated treatment well           Past Medical History:  Diagnosis Date  . Asthma   . COVID-19 long hauler   . Diabetes mellitus without complication ([place])   . Hypertension   . Migraines   . Obesity   . Pulmonary embolism ([place]) 05/2019   with COVID  . Sleep apnea     Past Surgical History:  Procedure Laterality Date  . BILATERAL CARPAL TUNNEL RELEASE    . TEE WITHOUT CARDIOVERSION N/A 03/28/2020   Procedure: TRANSESOPHAGEAL ECHOCARDIOGRAM (TEE);  Surgeon: Adrian Prows, MD;  Location: [facility];  Service: Cardiovascular;  Laterality: N/A;    There were no vitals filed for this visit.   Subjective Assessment - 06/14/20 1415    Subjective "They want to take the driving test in [place]"    Currently in Pain? No/denies                 ADULT SLP TREATMENT - 06/14/20 1416      General Information   Behavior/Cognition Alert;Cooperative;Pleasant mood      Cognitive-Linquistic Treatment   Treatment focused on Cognition;Patient/family/caregiver education    Skilled Treatment Sashia enters with concern re: rehab driving test scheduled .in March. Instructed her to practice naming traffic signs and self advocate due to speech impairment. In mock cognitive exam similar to  driving cognitive screen, Shelly Shields double checked herself on trail making task, figure copying and clock drawing with mod I. She found  errors in figure copying and self corrected independenlty after double checking. In clock drawing, she required cues to ID and correct error despite double checking (hand length was the same) she corrected with min verbal cue. In word recall, she rerqiured usual mod A to recall 5 words. Series 7 subtractions she double checked mental math as she was completing it and was correct. We discussed and I demonstrated strategies for word recall. She contiues to required min to mod A to reduce rate when stressed, as she was today re: driving eval. In simple conversation, after initial verbal cues, Shelly Shields self corrected speech errors by reducing rate with rare min A. She reports she is loosing items less frequently. She is doing OT/PT HEP, however watching TV most of the day. I provided a list of cognitive activities she is to incorporate into her day.      Assessment / Recommendations / Plan   Plan Continue with current plan of care      Progression Toward Goals   Progression toward goals Progressing toward goals            SLP Education - 06/15/20 1523    Education Details strategies to for word recall and error awareness on driving  test; practice written portion of driving test;l cognitive activities to do at home    Person(s) Educated Patient    Methods Explanation;Demonstration;Verbal cues;Handout    Comprehension Verbalized understanding;Verbal cues required;Need further instruction            SLP Short Term Goals - 06/15/20 1526      SLP [place] #1   Title pt will perform dysarhthria HEP with rare min x2 consecutive sessions    Time 1    Period Weeks   or 9 total sessions   Status Partially Met      SLP SHORT TERM GOAL #2   Title pt will complete aphasia testing PRN and goals added PRN    Status Deferred      SLP SHORT TERM GOAL #3   Title  pt will undergo cognitive communication testing and goals added PRN    Status Achieved      SLP SHORT TERM GOAL #4   Title pt will use compensatory measures for speech intelligibilty of 95%+ in 10 minutes simple conversation x3 sessions    Time 1    Period Weeks    Status Not Met      SLP SHORT TERM GOAL #5   Title pt will demo awareness of errors, given self correction, in simple cognitive linguistic tasks in 3 sessions    Time 1    Period Weeks    Status Not Met      SLP SHORT TERM GOAL #6   Title pt will demonstrate appropriate compensatory measures for decr'd attention skills in simple cognitve linguistic tasks    Time 1    Period Weeks    Status Deferred            SLP Long Term Goals - 06/15/20 1526      SLP LONG TERM GOAL #1   Title pt will use compensatory measures for speech intelligibilty of 95%+ in 10 minutes mod complex conversation, using self correction PRN x3 sessions    Time 4    Period Weeks   or 17 total sessions, for all LTGs   Status On-going      SLP LONG TERM GOAL #2   Title pt will use compensations for speech intelligiblity in 19/20 sentence responses resulting in 100% intelligbility in 3 sessions    Time 4    Period Weeks    Status On-going      SLP LONG TERM GOAL #3   Title pt will demonstrate appropriate anticipatory awareness in mod complex cognitive linguistic tasks by spontaneously self-correcting or double checking work 100% of the time in 3 sessions    Time 4    Period Weeks    Status On-going      SLP LONG TERM GOAL #4   Title pt will have 100% accuracy in simultaneous communicative tasks (simple divided attention) or make compensations for situations where she would have normally performed divided attention, in 3 sessions    Time 4    Period Weeks    Status On-going      SLP LONG TERM GOAL #5   Title in her last 1-2 visits, pt will demonstrate improved QOL score (</= 80) on speech QOL measure    Time 4    Period Weeks    Status  On-going            Plan - 06/15/20 1524    Clinical Impression Statement Shelly Shields continues to present with improving mild ataxic dysarthira and improving  mild high level cognitive impairments of attention, memoyr, and awareness. Ongoing training in compensations for dysarthira and cognition. 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    Speech Therapy Frequency 2x / week    Duration 8 weeks   17 visitis   Treatment/Interventions Language facilitation;Cueing hierarchy;Cognitive reorganization;Internal/external aids;Patient/family education;Compensatory strategies;SLP instruction and feedback;Multimodal communcation approach;Functional tasks;Oral motor exercises    Potential to Achieve Goals Good           Patient will benefit from skilled therapeutic intervention in order to improve the following deficits and impairments:   Dysarthria and anarthria  Cognitive communication deficit  Aphasia    Problem List Patient Active Problem List   Diagnosis Date Noted  . Acute CVA (cerebrovascular accident) ([place]) 03/27/2020  . Sleep apnea   . Diabetes mellitus without complication ([place])   . COVID-19 long hauler   . Obesity   . Acute pulmonary embolism ([facility]) 06/30/2019  . Pulmonary embolism ([place]) 05/2019  . Hypernatremia   . Asthma, chronic   . DKA (diabetic ketoacidoses) 11/27/2014  . Diabetes mellitus, new onset ([place])   . Hyperglycemic hyperosmolar nonketotic coma ([place])   . Essential hypertension     , Shelly Rusk MS, [place] 06/15/2020, 3:28 PM  Shelly Shields [address] [place], [name], [address] Phone: [phone]   Fax:  [phone]   Name: Shelly Shields MRN: [id number] Date of Birth: 08-16-1980

## 2020-06-15 NOTE — Telephone Encounter (Signed)
Spoke to patient regarding her monitor she is aware no data was gotten and she is getting another one whenever her next ov is

## 2020-06-18 ENCOUNTER — Encounter: Payer: Self-pay | Admitting: Emergency Medicine

## 2020-06-18 ENCOUNTER — Ambulatory Visit
Admission: EM | Admit: 2020-06-18 | Discharge: 2020-06-18 | Disposition: A | Discharge status: Home / Self Care | Payer: 59 | Attending: Urgent Care | Admitting: Urgent Care

## 2020-06-18 ENCOUNTER — Other Ambulatory Visit: Payer: Self-pay

## 2020-06-18 DIAGNOSIS — R109 Unspecified abdominal pain: Secondary | ICD-10-CM

## 2020-06-18 LAB — POCT URINALYSIS DIP (MANUAL ENTRY)
Bilirubin, UA: NEGATIVE
Blood, UA: NEGATIVE
Glucose, UA: NEGATIVE mg/dL
Ketones, POC UA: NEGATIVE mg/dL
Leukocytes, UA: NEGATIVE
Nitrite, UA: NEGATIVE
Protein Ur, POC: NEGATIVE mg/dL
Spec Grav, UA: 1.015 (ref 1.010–1.025)
Urobilinogen, UA: 0.2 E.U./dL
pH, UA: 6 (ref 5.0–8.0)

## 2020-06-18 MED ORDER — METHOCARBAMOL 500 MG PO TABS: 500.0000 mg | ORAL_TABLET | Freq: Three times a day (TID) | ORAL | 0 refills | Status: DC | PRN

## 2020-06-18 NOTE — ED Triage Notes (Signed)
Pt sts right sided flank pain to pain under right breast x 2 days; denies other urinary sx

## 2020-06-18 NOTE — ED Provider Notes (Signed)
Shelly Shields   MRN: [id number] DOB: Oct 05, 1980  Subjective:   Shelly Shields is a 40 y.o. female presenting for 2 day history of acute onset recurrent right lateral chest pain, right thoracic back pain. Has had multiple work up for this and ultimately had an abnormal finding on her CT scan. Has followed up with her doctor and would like a repeat scan but has not gotten it.   No current facility-administered medications for this encounter.  Current Outpatient Medications:  .  apixaban (ELIQUIS) 5 MG TABS tablet, Take 1 tablet (5 mg total) by mouth 2 (two) times daily., Disp: 60 tablet, Rfl: 0 .  Baclofen 5 MG TABS, TAKE 1 TABLET BY MOUTH THREE TIMES DAILY AS NEEDED FOR HEADACHE OR NECK PAIN, Disp: , Rfl:  .  Blood Glucose Monitoring Suppl (ACCU-CHEK NANO SMARTVIEW) W/DEVICE KIT, 1 Device by Does not apply route 4 (four) times daily - after meals and at bedtime., Disp: 1 kit, Rfl: 0 .  butalbital-acetaminophen-caffeine (FIORICET) 50-325-40 MG tablet, Take 1 tablet by mouth every 6 (six) hours as needed for headache., Disp: 20 tablet, Rfl: 0 .  carvedilol (COREG) 25 MG tablet, Take 0.5 tablets (12.5 mg total) by mouth 2 (two) times daily with a meal., Disp: 60 tablet, Rfl: 3 .  escitalopram (LEXAPRO) 10 MG tablet, Take 10 mg by mouth daily., Disp: , Rfl:  .  fexofenadine (ALLEGRA) 180 MG tablet, Take 180 mg by mouth daily as needed. , Disp: , Rfl:  .  fluticasone (FLONASE) 50 MCG/ACT nasal spray, Place 2 sprays into both nostrils daily as needed for allergies. , Disp: , Rfl: 3 .  fluticasone (FLOVENT HFA) 110 MCG/ACT inhaler, Inhale 1 puff into the lungs daily. , Disp: , Rfl:  .  glucose blood (ACCU-CHEK SMARTVIEW) test strip, Check sugar 6 x daily (Patient taking differently: Check sugar 6 x daily. Uses true test instead), Disp: 200 each, Rfl: 3 .  insulin glargine (LANTUS) 100 UNIT/ML Solostar Pen, Inject 80 Units into the skin 2 (two) times daily., Disp: , Rfl:  .  insulin  lispro protamine-lispro (HUMALOG 75/25 MIX) (75-25) 100 UNIT/ML SUSP injection, Inject 50 Units into the skin 3 (three) times daily. Sliding scale, Disp: , Rfl:  .  Insulin Pen Needle (INSUPEN PEN NEEDLES) 32G X 4 MM MISC, BD Pen Needles- brand specific. Inject insulin via insulin pen daily, Disp: 200 each, Rfl: 3 .  Lancet Devices (ACCU-CHEK SOFTCLIX) lancets, Use as instructed for blood glucose checks four times daily, before meals and at bedtime, Disp: 100 each, Rfl: 5 .  medroxyPROGESTERone (DEPO-PROVERA) 150 MG/ML injection, Inject 150 mg into the muscle every 3 (three) months., Disp: , Rfl:  .  montelukast (SINGULAIR) 10 MG tablet, Take 10 mg by mouth at bedtime., Disp: , Rfl:  .  nortriptyline (PAMELOR) 10 MG capsule, Take 10 mg by mouth at bedtime., Disp: , Rfl:  .  ondansetron (ZOFRAN) 4 MG tablet, Take 1 tablet (4 mg total) by mouth every 8 (eight) hours as needed for nausea or vomiting., Disp: 20 tablet, Rfl: 0 .  pantoprazole (PROTONIX) 40 MG tablet, Take 40 mg by mouth daily., Disp: , Rfl:  .  rosuvastatin (CRESTOR) 20 MG tablet, Take 1 tablet (20 mg total) by mouth daily at 6 PM., Disp: 30 tablet, Rfl: 0 .  sacubitril-valsartan (ENTRESTO) 49-51 MG, Take 1 tablet by mouth 2 (two) times daily., Disp: 60 tablet, Rfl: 2 .  topiramate (TOPAMAX) 100 MG tablet, Take 200 mg by  mouth at bedtime. , Disp: , Rfl:  .  TRUEPLUS LANCETS 33G MISC, USE TO CHECK BLOOD GLUCOSE QID - AFTER MEALS AND AT BEDTIME, Disp: , Rfl: 3 .  VENTOLIN HFA 108 (90 BASE) MCG/ACT inhaler, Use as directed 2 puffs in the mouth or throat 4 (four) times daily as needed for shortness of breath. , Disp: , Rfl: 3   Allergies  Allergen Reactions  . Lisinopril Cough  . Metformin Diarrhea  . Ibuprofen Hives  . Ketorolac Hives    Past Medical History:  Diagnosis Date  . Asthma   . COVID-19 long hauler   . Diabetes mellitus without complication ([place])   . Hypertension   . Migraines   . Obesity   . Pulmonary embolism ([place])  05/2019   with COVID  . Sleep apnea      Past Surgical History:  Procedure Laterality Date  . BILATERAL CARPAL TUNNEL RELEASE    . TEE WITHOUT CARDIOVERSION N/A 03/28/2020   Procedure: TRANSESOPHAGEAL ECHOCARDIOGRAM (TEE);  Surgeon: Adrian Prows, MD;  Location: [facility] ENDOSCOPY;  Service: Cardiovascular;  Laterality: N/A;    Family History  Problem Relation Age of Onset  . Hypertension Mother   . Thyroid disease Mother   . Hypertension Father   . Stroke Father 27  . Diabetes Father   . Sleep apnea Father   . CVA Paternal Uncle 67    Social History   Tobacco Use  . Smoking status: Never Smoker  . Smokeless tobacco: Never Used  Vaping Use  . Vaping Use: Never used  Substance Use Topics  . Alcohol use: Yes    Alcohol/week: 0.0 standard drinks    Comment: occasional   . Drug use: No    ROS   Objective:   Vitals: Vitals reviewed during exam and were all within normal limits.   Physical Exam Constitutional:      General: She is not in acute distress.    Appearance: Normal appearance. She is well-developed. She is not ill-appearing, toxic-appearing or diaphoretic.  HENT:     Head: Normocephalic and atraumatic.     Nose: Nose normal.     Mouth/Throat:     Mouth: Mucous membranes are moist.     Pharynx: Oropharynx is clear.  Eyes:     General: No scleral icterus.    Extraocular Movements: Extraocular movements intact.     Pupils: Pupils are equal, round, and reactive to light.  Cardiovascular:     Rate and Rhythm: Normal rate and regular rhythm.     Pulses: Normal pulses.     Heart sounds: Normal heart sounds. No murmur heard. No friction rub. No gallop.   Pulmonary:     Effort: Pulmonary effort is normal. No respiratory distress.     Breath sounds: Normal breath sounds. No stridor. No wheezing, rhonchi or rales.  Abdominal:     Tenderness: There is no right CVA tenderness or left CVA tenderness.  Musculoskeletal:       Arms:       Back:  Skin:    General:  Skin is warm and dry.     Findings: No rash.  Neurological:     General: No focal deficit present.     Mental Status: She is alert and oriented to person, place, and time.  Psychiatric:        Mood and Affect: Mood normal.        Behavior: Behavior normal.        Thought Content: Thought content  normal.        Judgment: Judgment normal.     Results for orders placed or performed during the hospital encounter of 06/18/20 (from the past 24 hour(s))  POCT urinalysis dipstick     Status: None   Collection Time: 06/18/20  4:01 PM  Result Value Ref Range   Color, UA yellow yellow   Clarity, UA clear clear   Glucose, UA negative negative mg/dL   Bilirubin, UA negative negative   Ketones, POC UA negative negative mg/dL   Spec Grav, UA 1.015 1.010 - 1.025   Blood, UA negative negative   pH, UA 6.0 5.0 - 8.0   Protein Ur, POC negative negative mg/dL   Urobilinogen, UA 0.2 0.2 or 1.0 E.U./dL   Nitrite, UA Negative Negative   Leukocytes, UA Negative Negative   CT Code Stroke CTA Head W/WO contrast  Result Date: 03/27/2020 CLINICAL DATA:  40 year old female code stroke presentation. Slurred speech. Plain head CT suspicious for right MCA infarct ASPECTS 8. History of diabetes, hypertension. Family history of stroke. Status post COVID-19 and PE in February. EXAM: CT ANGIOGRAPHY HEAD AND NECK CT PERFUSION BRAIN TECHNIQUE: Multidetector CT imaging of the head and neck was performed using the standard protocol during bolus administration of intravenous contrast. Multiplanar CT image reconstructions and MIPs were obtained to evaluate the vascular anatomy. Carotid stenosis measurements (when applicable) are obtained utilizing NASCET criteria, using the distal internal carotid diameter as the denominator. Multiphase CT imaging of the brain was performed following IV bolus contrast injection. Subsequent parametric perfusion maps were calculated using RAPID software. CONTRAST:  159mL OMNIPAQUE IOHEXOL 350  MG/ML SOLN COMPARISON:  plain head CT  0929 hours today. FINDINGS: CT Brain Perfusion Findings: ASPECTS: 8 CBF (<30%) Volume: 23mL Perfusion (Tmax>6.0s) volume: 69mL.  Hypoperfusion index 0.5. Mismatch Volume: 36mL Infarction Location:Right insula, operculum, largely corresponding to the plain CT findings. CTA NECK Skeleton: No acute osseous abnormality identified. Upper chest: Negative aside from mild motion artifact. Other neck: Negative. Aortic arch: 3 vessel arch configuration.  No arch atherosclerosis. Right carotid system: Negative aside from a partially retropharyngeal course. Left carotid system: Negative. Vertebral arteries: Detail of the proximal right subclavian artery and right vertebral origin partially obscured by dense right subclavian venous contrast. The right V1 segment appears normal. The right vertebral is patent to the skull base with no plaque or stenosis identified. Normal proximal left subclavian artery and left vertebral artery origin. Left vertebral artery is fairly codominant and within normal limits to the skull base. CTA HEAD Posterior circulation: Codominant distal vertebral arteries are within normal limits. Normal PICA origins and vertebrobasilar junction. Patent basilar artery without stenosis. AICA, SCA and PCA origins are within normal limits. Posterior communicating arteries are diminutive or absent. There is mild irregularity of the right PCA P2 segment (series 2, image 23. No significant stenosis. Otherwise bilateral PCA branches are within normal limits. Anterior circulation: Both ICA siphons are patent with no plaque or stenosis identified. Patent carotid termini. Normal MCA and ACA origins. Mildly dominant right A1. mild irregularity of the left A1 (series 10, image 22). Anterior communicating artery within normal limits. Other bilateral PCA branches are tortuous but within normal limits. Left MCA M1 segment and bifurcation are patent without stenosis. But thick MIP images on  series 12 suggest mild irregularity of left MCA M2 and M3 branches. Right MCA M1 segment and right MCA trifurcation are patent without stenosis. No right MCA M2 branch occlusion or irregularity is identified. But a right  M3 branch occlusion in the middle division is identified on series 12, image 14 and series 7 images 88 and 87. Other right MCA branches appear within normal limits. Venous sinuses: Early contrast timing, not evaluated. Anatomic variants: Mildly dominant right A1. Review of the MIP images confirms the above findings IMPRESSION: 1. Negative for large vessel occlusion but positive for Right MCA M3 occlusion. CT Perfusion detects core infarct with minimally larger penumbra at the right insula/operculum corresponding to the plain CT finding. Preliminary report of the above These results were communicated to Dr. Curly Shores at 1008 hours on 03/27/2020 by text page via the [facility] messaging system. 2. Additionally, mild vessel irregularity is noted in multiple other circle-of-Willis branches, including the left A1, right P2, left M2. Although nonspecific this constellation of clinical and imaging findings might indicate accelerated branch vessel atherosclerosis. Although other large vessels appear normal, with no atherosclerosis in the neck or at the aortic arch. Electronically Signed   By: Genevie Ann M.D.   On: 03/27/2020 10:24   CT Code Stroke CTA Neck W/WO contrast  Result Date: 03/27/2020 CLINICAL DATA:  40 year old female code stroke presentation. Slurred speech. Plain head CT suspicious for right MCA infarct ASPECTS 8. History of diabetes, hypertension. Family history of stroke. Status post COVID-19 and PE in February. EXAM: CT ANGIOGRAPHY HEAD AND NECK CT PERFUSION BRAIN TECHNIQUE: Multidetector CT imaging of the head and neck was performed using the standard protocol during bolus administration of intravenous contrast. Multiplanar CT image reconstructions and MIPs were obtained to evaluate the vascular  anatomy. Carotid stenosis measurements (when applicable) are obtained utilizing NASCET criteria, using the distal internal carotid diameter as the denominator. Multiphase CT imaging of the brain was performed following IV bolus contrast injection. Subsequent parametric perfusion maps were calculated using RAPID software. CONTRAST:  19mL OMNIPAQUE IOHEXOL 350 MG/ML SOLN COMPARISON:  plain head CT  0929 hours today. FINDINGS: CT Brain Perfusion Findings: ASPECTS: 8 CBF (<30%) Volume: 41mL Perfusion (Tmax>6.0s) volume: 34mL.  Hypoperfusion index 0.5. Mismatch Volume: 56mL Infarction Location:Right insula, operculum, largely corresponding to the plain CT findings. CTA NECK Skeleton: No acute osseous abnormality identified. Upper chest: Negative aside from mild motion artifact. Other neck: Negative. Aortic arch: 3 vessel arch configuration.  No arch atherosclerosis. Right carotid system: Negative aside from a partially retropharyngeal course. Left carotid system: Negative. Vertebral arteries: Detail of the proximal right subclavian artery and right vertebral origin partially obscured by dense right subclavian venous contrast. The right V1 segment appears normal. The right vertebral is patent to the skull base with no plaque or stenosis identified. Normal proximal left subclavian artery and left vertebral artery origin. Left vertebral artery is fairly codominant and within normal limits to the skull base. CTA HEAD Posterior circulation: Codominant distal vertebral arteries are within normal limits. Normal PICA origins and vertebrobasilar junction. Patent basilar artery without stenosis. AICA, SCA and PCA origins are within normal limits. Posterior communicating arteries are diminutive or absent. There is mild irregularity of the right PCA P2 segment (series 2, image 23. No significant stenosis. Otherwise bilateral PCA branches are within normal limits. Anterior circulation: Both ICA siphons are patent with no plaque or  stenosis identified. Patent carotid termini. Normal MCA and ACA origins. Mildly dominant right A1. mild irregularity of the left A1 (series 10, image 22). Anterior communicating artery within normal limits. Other bilateral PCA branches are tortuous but within normal limits. Left MCA M1 segment and bifurcation are patent without stenosis. But thick MIP images on series 12  suggest mild irregularity of left MCA M2 and M3 branches. Right MCA M1 segment and right MCA trifurcation are patent without stenosis. No right MCA M2 branch occlusion or irregularity is identified. But a right M3 branch occlusion in the middle division is identified on series 12, image 14 and series 7 images 88 and 87. Other right MCA branches appear within normal limits. Venous sinuses: Early contrast timing, not evaluated. Anatomic variants: Mildly dominant right A1. Review of the MIP images confirms the above findings IMPRESSION: 1. Negative for large vessel occlusion but positive for Right MCA M3 occlusion. CT Perfusion detects core infarct with minimally larger penumbra at the right insula/operculum corresponding to the plain CT finding. Preliminary report of the above These results were communicated to Dr. Curly Shores at 1008 hours on 03/27/2020 by text page via the [facility] messaging system. 2. Additionally, mild vessel irregularity is noted in multiple other circle-of-Willis branches, including the left A1, right P2, left M2. Although nonspecific this constellation of clinical and imaging findings might indicate accelerated branch vessel atherosclerosis. Although other large vessels appear normal, with no atherosclerosis in the neck or at the aortic arch. Electronically Signed   By: Genevie Ann M.D.   On: 03/27/2020 10:24   MR ANGIO HEAD WO CONTRAST  Result Date: 03/27/2020 CLINICAL DATA:  Stroke follow-up. Slurred speech. Left-sided weakness. EXAM: MRI HEAD WITHOUT CONTRAST MRA HEAD WITHOUT CONTRAST TECHNIQUE: Multiplanar, multiecho pulse sequences  of the brain and surrounding structures were obtained without intravenous contrast. Angiographic images of the head were obtained using MRA technique without contrast. COMPARISON:  Head CT, CTA, and CTP 03/27/2020 FINDINGS: MRI HEAD FINDINGS Brain: There is a moderate-sized acute right MCA territory infarct involving the posterior frontal lobe and insula with good correlation with the earlier CTP. there is a small amount of associated petechial hemorrhage without malignant hemorrhagic transformation. Additionally, there are subcentimeter infarcts involving cortex and white matter in the left parietal and left occipital lobes which are largely subacute in appearance and which also have a small amount of associated petechial hemorrhage. There is no intracranial mass effect or extra-axial fluid collection. The ventricles are normal in size. Vascular: Major intracranial vascular flow voids are preserved. Skull and upper cervical spine: Unremarkable bone marrow signal. Sinuses/Orbits: Unremarkable orbits. Paranasal sinuses and mastoid air cells are clear. Other: None. MRA HEAD FINDINGS The visualized distal vertebral arteries are widely patent to the basilar. Patent PICA, AICA, and SCA origins are seen bilaterally. The basilar artery is widely patent. Posterior communicating arteries are not clearly identified and may be diminutive or absent. Both PCAs are patent without evidence of a significant proximal stenosis. The internal carotid arteries are widely patent from skull base to carotid termini. ACAs and MCAs are patent without evidence of a significant proximal stenosis. As seen on the earlier CTA, there is a right M3 branch occlusion with some distal reconstitution. The scattered areas of mild branch vessel irregularity involving anterior and posterior circulation elsewhere on CTA are not apparent on this MRA. No aneurysm is identified. IMPRESSION: 1. Moderate-sized acute right MCA infarct. 2. Small subacute left  parieto-occipital infarcts. 3. Right M3 branch occlusion, otherwise negative head MRA. Electronically Signed   By: Logan Bores M.D.   On: 03/27/2020 17:45   MR BRAIN WO CONTRAST  Result Date: 03/27/2020 CLINICAL DATA:  Stroke follow-up. Slurred speech. Left-sided weakness. EXAM: MRI HEAD WITHOUT CONTRAST MRA HEAD WITHOUT CONTRAST TECHNIQUE: Multiplanar, multiecho pulse sequences of the brain and surrounding structures were obtained without intravenous contrast.  Angiographic images of the head were obtained using MRA technique without contrast. COMPARISON:  Head CT, CTA, and CTP 03/27/2020 FINDINGS: MRI HEAD FINDINGS Brain: There is a moderate-sized acute right MCA territory infarct involving the posterior frontal lobe and insula with good correlation with the earlier CTP. there is a small amount of associated petechial hemorrhage without malignant hemorrhagic transformation. Additionally, there are subcentimeter infarcts involving cortex and white matter in the left parietal and left occipital lobes which are largely subacute in appearance and which also have a small amount of associated petechial hemorrhage. There is no intracranial mass effect or extra-axial fluid collection. The ventricles are normal in size. Vascular: Major intracranial vascular flow voids are preserved. Skull and upper cervical spine: Unremarkable bone marrow signal. Sinuses/Orbits: Unremarkable orbits. Paranasal sinuses and mastoid air cells are clear. Other: None. MRA HEAD FINDINGS The visualized distal vertebral arteries are widely patent to the basilar. Patent PICA, AICA, and SCA origins are seen bilaterally. The basilar artery is widely patent. Posterior communicating arteries are not clearly identified and may be diminutive or absent. Both PCAs are patent without evidence of a significant proximal stenosis. The internal carotid arteries are widely patent from skull base to carotid termini. ACAs and MCAs are patent without evidence of  a significant proximal stenosis. As seen on the earlier CTA, there is a right M3 branch occlusion with some distal reconstitution. The scattered areas of mild branch vessel irregularity involving anterior and posterior circulation elsewhere on CTA are not apparent on this MRA. No aneurysm is identified. IMPRESSION: 1. Moderate-sized acute right MCA infarct. 2. Small subacute left parieto-occipital infarcts. 3. Right M3 branch occlusion, otherwise negative head MRA. Electronically Signed   By: Logan Bores M.D.   On: 03/27/2020 17:45   CT ABDOMEN PELVIS W CONTRAST  Result Date: 03/27/2020 CLINICAL DATA:  Flank pain. EXAM: CT ABDOMEN AND PELVIS WITH CONTRAST TECHNIQUE: Multidetector CT imaging of the abdomen and pelvis was performed using the standard protocol following bolus administration of intravenous contrast. CONTRAST:  58mL OMNIPAQUE IOHEXOL 300 MG/ML  SOLN COMPARISON:  CT pelvis 06/30/2019.  CT abdomen/pelvis 06/22/2019. FINDINGS: Lower chest: Relatively well-defined 2.2 cm lesion in the inferior lingula (image 4/series 5) measures water density. This may be loculated pleural fluid or intraparenchymal fluid collection in this patient with a history of pulmonary embolus and bilateral ground-glass airspace disease on the previous study suspicious for multifocal atypical/viral pneumonia. Heart size upper normal. Hepatobiliary: No suspicious focal abnormality within the liver parenchyma. There is no evidence for gallstones, gallbladder wall thickening, or pericholecystic fluid. No intrahepatic or extrahepatic biliary dilation. Pancreas: No focal mass lesion. No dilatation of the main duct. No intraparenchymal cyst. No peripancreatic edema. Spleen: No splenomegaly. No focal mass lesion. Adrenals/Urinary Tract: No adrenal nodule or mass. New 18 mm hypoattenuating lesion identified upper pole right kidney with areas of subcapsular multifocal decreased enhancement in the lower pole right kidney. Left kidney  unremarkable. No evidence for hydroureter. The urinary bladder appears normal for the degree of distention. Stomach/Bowel: Stomach is unremarkable. No gastric wall thickening. No evidence of outlet obstruction. Duodenum is normally positioned as is the ligament of Treitz. No small bowel wall thickening. No small bowel dilatation. The terminal ileum is normal. The appendix is normal. No gross colonic mass. No colonic wall thickening. Vascular/Lymphatic: No abdominal aortic aneurysm. No abdominal aortic atherosclerotic calcification. There is no gastrohepatic or hepatoduodenal ligament lymphadenopathy. No retroperitoneal or mesenteric lymphadenopathy. No pelvic sidewall lymphadenopathy. Reproductive: The uterus is unremarkable.  There is  no adnexal mass. Other: No intraperitoneal free fluid. Musculoskeletal: No worrisome lytic or sclerotic osseous abnormality. IMPRESSION: 1. New 18 mm hypoattenuating lesion upper pole right kidney with areas of subcapsular multifocal decreased enhancement in the lower pole right kidney. Imaging features are nonspecific and could be related to pyelonephritis and phlegmon/evolving abscess in the upper pole right kidney cannot be excluded. Alternatively, multifocal right renal infarct could have this appearance. Correlation with urinalysis may prove helpful. 2. Relatively well-defined 2.2 cm lesion in the inferior lingula measures water density. This may be loculated pleural fluid or intraparenchymal fluid collection in this patient with a history of pulmonary embolus and bilateral ground-glass airspace disease on the previous study suspicious for multifocal atypical/viral pneumonia. Consider follow-up to ensure resolution. Electronically Signed   By: Misty Stanley M.D.   On: 03/27/2020 12:03   CT Code Stroke Cerebral Perfusion with contrast  Result Date: 03/27/2020 CLINICAL DATA:  40 year old female code stroke presentation. Slurred speech. Plain head CT suspicious for right MCA  infarct ASPECTS 8. History of diabetes, hypertension. Family history of stroke. Status post COVID-19 and PE in February. EXAM: CT ANGIOGRAPHY HEAD AND NECK CT PERFUSION BRAIN TECHNIQUE: Multidetector CT imaging of the head and neck was performed using the standard protocol during bolus administration of intravenous contrast. Multiplanar CT image reconstructions and MIPs were obtained to evaluate the vascular anatomy. Carotid stenosis measurements (when applicable) are obtained utilizing NASCET criteria, using the distal internal carotid diameter as the denominator. Multiphase CT imaging of the brain was performed following IV bolus contrast injection. Subsequent parametric perfusion maps were calculated using RAPID software. CONTRAST:  162mL OMNIPAQUE IOHEXOL 350 MG/ML SOLN COMPARISON:  plain head CT  0929 hours today. FINDINGS: CT Brain Perfusion Findings: ASPECTS: 8 CBF (<30%) Volume: 74mL Perfusion (Tmax>6.0s) volume: 75mL.  Hypoperfusion index 0.5. Mismatch Volume: 24mL Infarction Location:Right insula, operculum, largely corresponding to the plain CT findings. CTA NECK Skeleton: No acute osseous abnormality identified. Upper chest: Negative aside from mild motion artifact. Other neck: Negative. Aortic arch: 3 vessel arch configuration.  No arch atherosclerosis. Right carotid system: Negative aside from a partially retropharyngeal course. Left carotid system: Negative. Vertebral arteries: Detail of the proximal right subclavian artery and right vertebral origin partially obscured by dense right subclavian venous contrast. The right V1 segment appears normal. The right vertebral is patent to the skull base with no plaque or stenosis identified. Normal proximal left subclavian artery and left vertebral artery origin. Left vertebral artery is fairly codominant and within normal limits to the skull base. CTA HEAD Posterior circulation: Codominant distal vertebral arteries are within normal limits. Normal PICA origins  and vertebrobasilar junction. Patent basilar artery without stenosis. AICA, SCA and PCA origins are within normal limits. Posterior communicating arteries are diminutive or absent. There is mild irregularity of the right PCA P2 segment (series 2, image 23. No significant stenosis. Otherwise bilateral PCA branches are within normal limits. Anterior circulation: Both ICA siphons are patent with no plaque or stenosis identified. Patent carotid termini. Normal MCA and ACA origins. Mildly dominant right A1. mild irregularity of the left A1 (series 10, image 22). Anterior communicating artery within normal limits. Other bilateral PCA branches are tortuous but within normal limits. Left MCA M1 segment and bifurcation are patent without stenosis. But thick MIP images on series 12 suggest mild irregularity of left MCA M2 and M3 branches. Right MCA M1 segment and right MCA trifurcation are patent without stenosis. No right MCA M2 branch occlusion or irregularity is identified. But  a right M3 branch occlusion in the middle division is identified on series 12, image 14 and series 7 images 88 and 87. Other right MCA branches appear within normal limits. Venous sinuses: Early contrast timing, not evaluated. Anatomic variants: Mildly dominant right A1. Review of the MIP images confirms the above findings IMPRESSION: 1. Negative for large vessel occlusion but positive for Right MCA M3 occlusion. CT Perfusion detects core infarct with minimally larger penumbra at the right insula/operculum corresponding to the plain CT finding. Preliminary report of the above These results were communicated to Dr. Curly Shores at 1008 hours on 03/27/2020 by text page via the [facility] messaging system. 2. Additionally, mild vessel irregularity is noted in multiple other circle-of-Willis branches, including the left A1, right P2, left M2. Although nonspecific this constellation of clinical and imaging findings might indicate accelerated branch vessel  atherosclerosis. Although other large vessels appear normal, with no atherosclerosis in the neck or at the aortic arch. Electronically Signed   By: Genevie Ann M.D.   On: 03/27/2020 10:24   ECHO TEE  Result Date: 04/01/2020    TRANSESOPHOGEAL ECHO REPORT   Patient Name:   Shelly Shields Date of Exam: 03/28/2020 Medical Rec #:  [id number]        Height:       61.0 in Accession #:    [id number]       Weight:       200.0 lb Date of Birth:  [birth year]        BSA:          1.889 m Patient Age:    79 years         BP:           127/74 mmHg Patient Gender: F                HR:           93 bpm. Exam Location:  Inpatient Procedure: Transesophageal Echo, Color Doppler and Cardiac Doppler Indications:     Stroke 434.91 / I63.9  History:         Patient has prior history of Echocardiogram examinations, most                  recent 03/27/2020. Risk Factors:Diabetes and Hypertension.                  [place].  Sonographer:     Darlina Sicilian RDCS Referring Phys:  [place] Diagnosing Phys: Adrian Prows MD PROCEDURE: After discussion of the risks and benefits of a TEE, an informed consent was obtained from the patient. The transesophogeal probe was passed without difficulty through the esophogus of the patient. Imaged were obtained with the patient in a left lateral decubitus position. Local oropharyngeal anesthetic was provided with Cetacaine. Sedation performed by different physician. The patient's vital signs; including heart rate, blood pressure, and oxygen saturation; remained stable throughout the  procedure. The patient developed no complications during the procedure. IMPRESSIONS  1. Left ventricular ejection fraction, by estimation, is 20 to 25%. The left ventricle has severely decreased function. The left ventricle demonstrates global hypokinesis. The left ventricular internal cavity size was mildly dilated. Left ventricular diastolic function could not be evaluated.  2. Right ventricular systolic function  is normal. The right ventricular size is normal.  3. Left atrial size was moderately dilated. No left atrial/left atrial appendage thrombus was detected.  4. The mitral valve is normal in structure. Trivial mitral valve  regurgitation. No evidence of mitral stenosis.  5. The aortic valve is normal in structure. Aortic valve regurgitation is not visualized. No aortic stenosis is present.  6. Evidence of atrial level shunting detected by color flow Doppler. Agitated saline contrast bubble study was positive with shunting observed within 3-6 cardiac cycles suggestive of interatrial shunt. There is a small patent foramen ovale with predominantly right to left shunting across the atrial septum. Conclusion(s)/Recommendation(s): No mural thrombus noted in the LV, no regional wall motion abnormality. Findings consistent with dilated cardiomyopathy. There is a very small PFO with mildly positive right to left shunting. There is no obvious source of cerebral emboli. If DVT present, consider paradoxical embolism. FINDINGS  Left Ventricle: Left ventricular ejection fraction, by estimation, is 20 to 25%. The left ventricle has severely decreased function. The left ventricle demonstrates global hypokinesis. The left ventricular internal cavity size was mildly dilated. There is no left ventricular hypertrophy. Left ventricular diastolic function could not be evaluated. Right Ventricle: The right ventricular size is normal. No increase in right ventricular wall thickness. Right ventricular systolic function is normal. Left Atrium: Left atrial size was moderately dilated. No left atrial/left atrial appendage thrombus was detected. Right Atrium: Right atrial size was normal in size. Pericardium: There is no evidence of pericardial effusion. Mitral Valve: The mitral valve is normal in structure. Trivial mitral valve regurgitation. No evidence of mitral valve stenosis. Tricuspid Valve: The tricuspid valve is normal in structure.  Tricuspid valve regurgitation is not demonstrated. No evidence of tricuspid stenosis. Aortic Valve: The aortic valve is normal in structure. Aortic valve regurgitation is not visualized. No aortic stenosis is present. Pulmonic Valve: The pulmonic valve was normal in structure. Pulmonic valve regurgitation is not visualized. No evidence of pulmonic stenosis. Aorta: The aortic root is normal in size and structure. IAS/Shunts: Evidence of atrial level shunting detected by color flow Doppler. Agitated saline contrast was given intravenously to evaluate for intracardiac shunting. Agitated saline contrast bubble study was positive with shunting observed within 3-6 cardiac cycles suggestive of interatrial shunt. A small patent foramen ovale is detected with predominantly right to left shunting across the atrial septum. Adrian Prows MD Electronically signed by Adrian Prows MD Signature Date/Time: 04/01/2020/3:13:34 PM    Final    ECHOCARDIOGRAM COMPLETE BUBBLE STUDY  Result Date: 03/27/2020    ECHOCARDIOGRAM REPORT   Patient Name:   LTANYA BAYLEY Date of Exam: 03/27/2020 Medical Rec #:  [id number]        Height:       61.0 in Accession #:    [id number]       Weight:       204.0 lb Date of Birth:  [birth year]        BSA:          1.905 m Patient Age:    33 years         BP:           135/87 mmHg Patient Gender: F                HR:           100 bpm. Exam Location:  Inpatient Procedure: 2D Echo, Cardiac Doppler, Color Doppler and Intracardiac            Opacification Agent Indications:    Stroke 434.91 / I63.9  History:        Patient has prior history of Echocardiogram examinations, most  recent 06/30/2019. Risk Factors:Hypertension, Diabetes and Sleep                 Apnea. COVID-19.  Sonographer:    Jonelle Sidle Dance Referring Phys: [id number] [place]  1. Left ventricular ejection fraction, by estimation, is 20 to 25%. The left ventricle has severely decreased function. There is severe hypokinesis  of the basal-to-apical anteroseptum, inferoseptum and anterior LV walls . Left ventricular diastolic parameters are consistent with Grade II diastolic dysfunction (pseudonormalization).  2. Right ventricular systolic function is normal. The right ventricular size is normal.  3. Left atrial size was moderately dilated.  4. The mitral valve is normal in structure. Trivial mitral valve regurgitation.  5. The aortic valve is tricuspid. There is mild thickening of the aortic valve. Aortic valve regurgitation is not visualized.  6. The inferior vena cava is normal in size with greater than 50% respiratory variability, suggesting right atrial pressure of 3 mmHg.  7. Agitated saline contrast bubble study was negative, with no evidence of any interatrial shunt. Comparison(s): Compared to prior echo in 06/2019, the LVEF is now severely reduced to 20-25% with severe hypokinesis of the inferoseptal, anteroseptal and anterior LV walls. Conclusion(s)/Recommendation(s): No intracardiac source of embolism detected on this transthoracic study. A transesophageal echocardiogram is recommended to exclude cardiac source of embolism if clinically indicated. FINDINGS  Left Ventricle: Left ventricular ejection fraction, by estimation, is 20 to 25%. The left ventricle has severely decreased function. There is severe hypokinesis of the basal-to-apical anteroseptum, inferoseptum and anterior LV walls. Definity contrast agent was given IV to delineate the left ventricular endocardial borders. The left ventricular internal cavity size was normal in size. There is no left ventricular hypertrophy. Left ventricular diastolic parameters are consistent with Grade II diastolic  dysfunction (pseudonormalization). Right Ventricle: The right ventricular size is normal. No increase in right ventricular wall thickness. Right ventricular systolic function is normal. Left Atrium: Left atrial size was moderately dilated. Right Atrium: Right atrial size was  normal in size. Pericardium: There is no evidence of pericardial effusion. Mitral Valve: The mitral valve is normal in structure. There is mild thickening of the mitral valve leaflet(s). Mild mitral annular calcification. Trivial mitral valve regurgitation. Tricuspid Valve: The tricuspid valve is normal in structure. Tricuspid valve regurgitation is trivial. Aortic Valve: The aortic valve is tricuspid. There is mild thickening of the aortic valve. Aortic valve regurgitation is not visualized. Pulmonic Valve: The pulmonic valve was normal in structure. Pulmonic valve regurgitation is trivial. Aorta: The aortic root and ascending aorta are structurally normal, with no evidence of dilitation. Venous: The inferior vena cava is normal in size with greater than 50% respiratory variability, suggesting right atrial pressure of 3 mmHg. IAS/Shunts: No atrial level shunt detected by color flow Doppler. Agitated saline contrast was given intravenously to evaluate for intracardiac shunting. Agitated saline contrast bubble study was negative, with no evidence of any interatrial shunt.  LEFT VENTRICLE PLAX 2D LVIDd:         5.00 cm LVIDs:         4.50 cm LV PW:         1.30 cm LV IVS:        0.80 cm LVOT diam:     1.80 cm LV SV:         31 LV SV Index:   16 LVOT Area:     2.54 cm  RIGHT VENTRICLE             IVC  RV Basal diam:  2.40 cm     IVC diam: 1.70 cm RV S prime:     13.20 cm/s TAPSE (M-mode): 1.7 cm LEFT ATRIUM             Index       RIGHT ATRIUM           Index LA diam:        4.50 cm 2.36 cm/m  RA Area:     14.10 cm LA Vol (A2C):   75.3 ml 39.53 ml/m RA Volume:   33.40 ml  17.54 ml/m LA Vol (A4C):   87.2 ml 45.78 ml/m LA Biplane Vol: 81.2 ml 42.63 ml/m  AORTIC VALVE LVOT Vmax:   74.20 cm/s LVOT Vmean:  49.200 cm/s LVOT VTI:    0.122 m  AORTA Ao Root diam: 2.90 cm Ao Asc diam:  2.80 cm MITRAL VALVE MV Area (PHT): 3.31 cm     SHUNTS MV Decel Time: 229 msec     Systemic VTI:  0.12 m MV E velocity: 120.00 cm/s   Systemic Diam: 1.80 cm MV A velocity: 85.90 cm/s MV E/A ratio:  1.40 Gwyndolyn Kaufman MD Electronically signed by Gwyndolyn Kaufman MD Signature Date/Time: 03/27/2020/4:25:55 PM    Final    CT HEAD CODE STROKE WO CONTRAST  Result Date: 03/27/2020 CLINICAL DATA:  Code stroke.  Slurred speech. EXAM: CT HEAD WITHOUT CONTRAST TECHNIQUE: Contiguous axial images were obtained from the base of the skull through the vertex without intravenous contrast. COMPARISON:  None. FINDINGS: Brain: There is loss of gray-white differentiation and subtle hypoattenuation involving the right insula and the right posterior frontal cortex and underlying white matter. Mild sulcal effacement without substantial mass effect. No midline shift. Basal cisterns are patent. No acute hemorrhage. No hydrocephalus. No mass lesion. Vascular: No definite hyperdense vessel identified. Skull: No acute fracture. Sinuses/Orbits: No acute finding. Other: No mastoid effusions. ASPECTS [facility] Stroke Program Early CT Score) - Ganglionic level infarction (caudate, lentiform nuclei, internal capsule, insula, M1-M3 cortex): 6 - Supraganglionic infarction (M4-M6 cortex): 2 Total score (0-10 with 10 being normal): 8 IMPRESSION: 1. Findings concerning for acute or early subacute infarct involving the right insula and posterior frontal lobe (MCA territory). ASPECTS is 8. 2. No substantial mass effect or acute hemorrhage. Code stroke imaging results were communicated on 03/27/2020 at 9:38 am to provider Dr. Curly Shores Via telephone, who verbally acknowledged these results. Electronically Signed   By: Margaretha Sheffield MD   On: 03/27/2020 09:44   VAS [place] LOWER EXTREMITY VENOUS (DVT)  Result Date: 03/30/2020  Lower Venous DVT Study Indications: Stroke.  Limitations: Body habitus. Comparison Study: Priors study from 07/20/19 is available for comparison Performing Technologist: Sharion Dove RVS  Examination Guidelines: A complete evaluation includes B-mode imaging,  spectral Doppler, color Doppler, and power Doppler as needed of all accessible portions of each vessel. Bilateral testing is considered an integral part of a complete examination. Limited examinations for reoccurring indications may be performed as noted. The reflux portion of the exam is performed with the patient in reverse Trendelenburg.  +---------+---------------+---------+-----------+----------+--------------+ RIGHT    CompressibilityPhasicitySpontaneityPropertiesThrombus Aging +---------+---------------+---------+-----------+----------+--------------+ CFV      Full           Yes      Yes                                 +---------+---------------+---------+-----------+----------+--------------+ SFJ      Full                                                        +---------+---------------+---------+-----------+----------+--------------+  FV Prox  Full                                                        +---------+---------------+---------+-----------+----------+--------------+ FV Mid   Full                                                        +---------+---------------+---------+-----------+----------+--------------+ FV DistalFull                                                        +---------+---------------+---------+-----------+----------+--------------+ PFV      Full                                                        +---------+---------------+---------+-----------+----------+--------------+ POP      Full           Yes      Yes                                 +---------+---------------+---------+-----------+----------+--------------+ PTV      Full                                                        +---------+---------------+---------+-----------+----------+--------------+ PERO     Full                                                        +---------+---------------+---------+-----------+----------+--------------+    +---------+---------------+---------+-----------+----------+--------------+ LEFT     CompressibilityPhasicitySpontaneityPropertiesThrombus Aging +---------+---------------+---------+-----------+----------+--------------+ CFV      Full           Yes      Yes                                 +---------+---------------+---------+-----------+----------+--------------+ SFJ      Full                                                        +---------+---------------+---------+-----------+----------+--------------+ FV Prox  Full                                                        +---------+---------------+---------+-----------+----------+--------------+  FV Mid   Full                                                        +---------+---------------+---------+-----------+----------+--------------+ FV DistalFull                                                        +---------+---------------+---------+-----------+----------+--------------+ PFV      Full                                                        +---------+---------------+---------+-----------+----------+--------------+ POP      Full           Yes      Yes                                 +---------+---------------+---------+-----------+----------+--------------+ PTV      Full                                                        +---------+---------------+---------+-----------+----------+--------------+ PERO     Full                                                        +---------+---------------+---------+-----------+----------+--------------+     Summary: BILATERAL: - No evidence of deep vein thrombosis seen in the lower extremities, bilaterally. - RIGHT: - Findings appear essentially unchanged compared to previous examination.  LEFT: - Findings appear essentially unchanged compared to previous examination.  *See table(s) above for measurements and observations. Electronically signed by  Jamelle Haring on 03/30/2020 at 7:15:01 PM.    Final     Assessment and Plan :   PDMP not reviewed this encounter.  1. Right flank pain     Recommended conservative management and follow up with her regular doctor for consideration of an outpatient CT scan. Patient unable to take NSAIDs, recommend APAP, Robaxin. Counseled patient on potential for adverse effects with medications prescribed/recommended today, ER and return-to-clinic precautions discussed, patient verbalized understanding.    Jaynee Eagles, PA-C 06/21/20 1244

## 2020-06-18 NOTE — Discharge Instructions (Signed)
Do not use any nonsteroidal anti-inflammatories (NSAIDs) like ibuprofen, Motrin, naproxen, Aleve, etc. which are all available over-the-counter.  Please just use Tylenol at a dose of 500mg-650mg once every 6 hours as needed for your aches, pains, fevers. 

## 2020-06-20 ENCOUNTER — Ambulatory Visit: Payer: 59 | Admitting: Occupational Therapy

## 2020-06-20 NOTE — Progress Notes (Signed)
Primary Physician/Referring:  [name], MD  Patient ID: Shelly Shields, female    DOB: [birth year], 40 y.o.   MRN: [id number]  Chief Complaint  Patient presents with   Congestive Heart Failure   Follow-up   HPI:    Shelly Shields  is a 40 y.o. African-American female with hypertension, hyperlipidemia, uncontrolled diabetes mellitus, obesity and obstructive sleep apnea on CPAP and history of Covid pneumonia in January 2021 and also had pulmonary embolism during Covid infection.  She is also being evaluated for vasculitis. 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 Cardiomyopathy etiology likely stress cardiomyopathy vs significant coronary artery disease vs small PFO noted on TEE vs atrial fibrillation.   Patient presents for 2 week follow up of heart failure and medication titration. At last visit increased carvedilol from 6.25 mg to 12.5 mg BID for 1 week, and advised patient to increase to 25 mg BID if she tolerated 12.5 mg well. Patient is presently taking 25 mg carvedilol twice daily. Of note patient's previously placed cardiac monitor malfunctioned and therefore no data available for review, will place new cardiac monitor.   Patient was seen 06/18/2020 in urgent care for right flank pain and recommended she have follow up ultrasound for renal cyst, patient is following with PCP for this. She is presently feeling well. Denies chest pain, palpitations, dyspnea, leg swelling, dizziness. Patient is now walking without a cane, she has completed physical and occupational therapy, still attending speech therapy. Overall patient is recuperating well. She is tolerating anticoagulation without bleeding diathesis.   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 hauler    Diabetes mellitus without complication ([place])    Hypertension    Migraines    Obesity     Pulmonary embolism ([place]) 05/2019   with COVID   Sleep apnea    Past Surgical History:  Procedure Laterality Date   BILATERAL CARPAL TUNNEL RELEASE     TEE WITHOUT CARDIOVERSION N/A 03/28/2020   Procedure: TRANSESOPHAGEAL ECHOCARDIOGRAM (TEE);  Surgeon: Adrian Prows, MD;  Location: [facility] ENDOSCOPY;  Service: Cardiovascular;  Laterality: N/A;   Family History  Problem Relation Age of Onset   Hypertension Mother    Thyroid disease Mother    Hypertension Father    Stroke Father 25   Diabetes Father    Sleep apnea Father    CVA Paternal Uncle 31    Social History   Tobacco Use   Smoking status: Never Smoker   Smokeless tobacco: Never Used  Substance Use Topics   Alcohol use: Yes    Alcohol/week: 0.0 standard drinks    Comment: occasional    Marital Status: Single   ROS  Review of Systems  Constitutional: Negative for malaise/fatigue and weight gain.  Cardiovascular: Negative for chest pain, claudication, leg swelling, near-syncope, orthopnea, palpitations, paroxysmal nocturnal dyspnea and syncope.  Respiratory: Negative for shortness of breath.   Hematologic/Lymphatic: Does not bruise/bleed easily.  Gastrointestinal: Negative for melena.  Neurological: Negative for dizziness and weakness.    Objective  Blood pressure 120/82, pulse 98, temperature (!) 97.3 F (36.3 C), resp. rate 16, height _0  (1.549 m), weight 228 lb (103.4 kg), SpO2 99 %.  Vitals with BMI 06/22/2020 06/02/2020 06/02/2020  Height _1  - -  Weight 228 lbs - -  BMI 34.2 - -  Systolic 876 811 572  Diastolic 82 82 83  Pulse 98 91 88      Physical Exam Vitals reviewed.  Constitutional:      Appearance: She is obese.  HENT:     Head: Normocephalic and atraumatic.  Cardiovascular:     Rate and Rhythm: Normal rate and regular rhythm.     Pulses: Intact distal pulses.     Heart sounds: S1 normal and S2 normal. No murmur heard. No gallop.      Comments: No leg edema.  No JVD.  Pulmonary:      Effort: Pulmonary effort is normal. No respiratory distress.     Breath sounds: No wheezing, rhonchi or rales.  Musculoskeletal:     Right lower leg: No edema.     Left lower leg: No edema.  Neurological:     Mental Status: She is alert.     Cranial Nerves: Cranial nerve deficit present.     Gait: Gait abnormal (imprved, occasionally needs cane).     Laboratory examination:   Recent Labs    05/01/20 1612 05/22/20 1624 06/07/20 1545  NA 144 141 143  K 4.0 3.6 4.0  CL 110* 111* 110*  CO2 19* 17* 21  GLUCOSE 189* 251* 116*  BUN _0 CREATININE 1.03* 0.95 0.96  CALCIUM 9.1 8.8 9.2  GFRNONAA 68 75 74  GFRAA 79 87 86   estimated creatinine clearance is 86.1 mL/min (by C-G formula based on SCr of 0.96 mg/dL).  CMP Latest Ref Rng & Units 06/07/2020 05/22/2020 05/01/2020  Glucose 65 - 99 mg/dL 116(H) 251(H) 189(H)  BUN 6 - 24 mg/dL _1 Creatinine 0.57 - 1.00 mg/dL 0.96 0.95 1.03(H)  Sodium 134 - 144 mmol/L 143 141 144  Potassium 3.5 - 5.2 mmol/L 4.0 3.6 4.0  Chloride 96 - 106 mmol/L 110(H) 111(H) 110(H)  CO2 20 - 29 mmol/L 21 17(L) 19(L)  Calcium 8.7 - 10.2 mg/dL 9.2 8.8 9.1  Total Protein 6.5 - 8.1 g/dL - - -  Total Bilirubin 0.3 - 1.2 mg/dL - - -  Alkaline Phos 38 - 126 U/L - - -  AST 15 - 41 U/L - - -  ALT 0 - 44 U/L - - -   CBC Latest Ref Rng & Units 03/29/2020 03/27/2020 03/27/2020  WBC 4.0 - 10.5 K/uL 10.4 - 11.7(H)  Hemoglobin 12.0 - 15.0 g/dL 12.7 12.9 12.8  Hematocrit 36.0 - 46.0 % 38.3 38.0 39.3  Platelets 150 - 400 K/uL 245 - 195    Lipid Panel Recent Labs    03/28/20 0415  CHOL 176  TRIG 106  LDLCALC 126*  VLDL 21  HDL 29*  CHOLHDL 6.1    HEMOGLOBIN A1C Lab Results  Component Value Date   HGBA1C 11.2 (H) 03/27/2020   MPG 274.74 03/27/2020   TSH Recent Labs    03/28/20 0415  TSH 3.896    External labs:  None   Medications and allergies   Allergies  Allergen Reactions   Lisinopril Cough   Metformin Diarrhea   Ibuprofen  Hives   Ketorolac Hives     Outpatient Medications Prior to Visit  Medication Sig Dispense Refill   apixaban (ELIQUIS) 5 MG TABS tablet Take 1 tablet (5 mg total) by mouth 2 (two) times daily. 60 tablet 0   Baclofen 5 MG TABS TAKE 1 TABLET BY MOUTH THREE TIMES DAILY AS NEEDED FOR HEADACHE OR NECK PAIN     Blood Glucose Monitoring Suppl (ACCU-CHEK NANO SMARTVIEW) W/DEVICE KIT 1 Device by Does  not apply route 4 (four) times daily - after meals and at bedtime. 1 kit 0   butalbital-acetaminophen-caffeine (FIORICET) 50-325-40 MG tablet Take 1 tablet by mouth every 6 (six) hours as needed for headache. 20 tablet 0   escitalopram (LEXAPRO) 10 MG tablet Take 10 mg by mouth daily.     fexofenadine (ALLEGRA) 180 MG tablet Take 180 mg by mouth daily as needed.      fluticasone (FLONASE) 50 MCG/ACT nasal spray Place 2 sprays into both nostrils daily as needed for allergies.   3   fluticasone (FLOVENT HFA) 110 MCG/ACT inhaler Inhale 1 puff into the lungs daily.      glucose blood (ACCU-CHEK SMARTVIEW) test strip Check sugar 6 x daily (Patient taking differently: Check sugar 6 x daily. Uses true test instead) 200 each 3   insulin glargine (LANTUS) 100 UNIT/ML Solostar Pen Inject 80 Units into the skin 2 (two) times daily.     insulin lispro (HUMALOG) 100 UNIT/ML injection Inject into the skin. Sliding scale     insulin lispro protamine-lispro (HUMALOG 75/25 MIX) (75-25) 100 UNIT/ML SUSP injection Inject 50 Units into the skin 3 (three) times daily. Sliding scale     Insulin Pen Needle (INSUPEN PEN NEEDLES) 32G X 4 MM MISC BD Pen Needles- brand specific. Inject insulin via insulin pen daily 200 each 3   Lancet Devices (ACCU-CHEK SOFTCLIX) lancets Use as instructed for blood glucose checks four times daily, before meals and at bedtime 100 each 5   medroxyPROGESTERone (DEPO-PROVERA) 150 MG/ML injection Inject 150 mg into the muscle every 3 (three) months.     methocarbamol (ROBAXIN) 500 MG  tablet Take 1 tablet (500 mg total) by mouth every 8 (eight) hours as needed for muscle spasms. 30 tablet 0   montelukast (SINGULAIR) 10 MG tablet Take 10 mg by mouth at bedtime.     nortriptyline (PAMELOR) 10 MG capsule Take 10 mg by mouth at bedtime.     ondansetron (ZOFRAN) 4 MG tablet Take 1 tablet (4 mg total) by mouth every 8 (eight) hours as needed for nausea or vomiting. 20 tablet 0   pantoprazole (PROTONIX) 40 MG tablet Take 40 mg by mouth daily.     rosuvastatin (CRESTOR) 20 MG tablet Take 1 tablet (20 mg total) by mouth daily at 6 PM. 30 tablet 0   topiramate (TOPAMAX) 100 MG tablet Take 200 mg by mouth at bedtime.      TRUEPLUS LANCETS 33G MISC USE TO CHECK BLOOD GLUCOSE QID - AFTER MEALS AND AT BEDTIME  3   TRULICITY 4.07 WK/0.8UP SOPN Inject 0.75 mg into the skin once a week.     VENTOLIN HFA 108 (90 BASE) MCG/ACT inhaler Use as directed 2 puffs in the mouth or throat 4 (four) times daily as needed for shortness of breath.   3   carvedilol (COREG) 25 MG tablet Take 0.5 tablets (12.5 mg total) by mouth 2 (two) times daily with a meal. 60 tablet 3   sacubitril-valsartan (ENTRESTO) 49-51 MG Take 1 tablet by mouth 2 (two) times daily. 60 tablet 2   No facility-administered medications prior to visit.     Radiology:   No results found. CT Code Stroke CTA Head W/WO contrast 03/27/2020 1. Negative for large vessel occlusion but positive for Right MCA M3 occlusion. CT Perfusion detects core infarct with minimally larger penumbra at the right insula/operculum corresponding to the plain CT finding.  2. Additionally, mild vessel irregularity is noted in multiple other circle-of-Willis branches,  including the left A1, right P2, left M2. Although nonspecific this constellation of clinical and imaging findings might indicate accelerated branch vessel atherosclerosis. Although other large vessels appear normal, with no atherosclerosis in the neck or at the aortic arch.  MR ANGIO  HEAD WO CONTRAST 03/27/2020 1. Moderate-sized acute right MCA infarct. 2. Small subacute left parieto-occipital infarcts. 3. Right M3 branch occlusion, otherwise negative head MRA.0200  CT ABDOMEN PELVIS W CONTRAST 03/27/2020 1. New 18 mm hypoattenuating lesion upper pole right kidney with areas of subcapsular multifocal decreased enhancement in the lower pole right kidney. Imaging features are nonspecific and could be related to pyelonephritis and phlegmon/evolving abscess in the upper pole right kidney cannot be excluded. Alternatively, multifocal right renal infarct could have this appearance. Correlation with urinalysis may prove helpful.  2. Relatively well-defined 2.2 cm lesion in the inferior lingula measures water density. This may be loculated pleural fluid or intraparenchymal fluid collection in this patient with a history of pulmonary embolus and bilateral ground-glass airspace disease on the previous study suspicious for multifocal atypical/viral pneumonia. Consider follow-up to ensure resolution.  Cardiac Studies:   Cardiac monitor: pending   ECHOCARDIOGRAM COMPLETE BUBBLE STUDY 03/27/2020 1. Left ventricular ejection fraction, by estimation, is 20 to 25%. The left ventricle has severely decreased function. There is severe hypokinesis of the basal-to-apical anteroseptum, inferoseptum and anterior LV walls . Left ventricular diastolic  parameters are consistent with Grade II diastolic dysfunction (pseudonormalization). 2. Right ventricular systolic function is normal. The right ventricular size is normal. 3. Left atrial size was moderately dilated. 4. The mitral valve is normal in structure. Trivial mitral valve regurgitation. 5. The aortic valve is tricuspid. There is mild thickening of the aortic valve. Aortic valve regurgitation is not visualized. 6. The inferior vena cava is normal in size with greater than 50% respiratory variability, suggesting right atrial pressure of 3  mmHg. 7. Agitated saline contrast bubble study was negative, with no evidence of any interatrial shunt.  Comparison(s): Compared to prior echo in 06/2019, the LVEF is now severely reduced to 20-25% with severe hypokinesis of the inferoseptal, anteroseptal and anterior LV walls.  EKG:   EKG 04/18/2020: Sinus rhythm at a rate of 90 bpm. Left axis deviation. PRWP, cannot exclude anteroseptal infarct old. Non-specific T wave abnormality.   EKG 03/27/2020: Sinus tachycardia at rate of 102 bpm, normal axis, borderline criteria for LVH. Borderline QT elongation. Compared to 06/30/2019, sinus tachycardia new. No change in QT elongation.  Assessment     ICD-10-CM   1. Dilated cardiomyopathy (HCC)  I42.0 carvedilol (COREG) 25 MG tablet    sacubitril-valsartan (ENTRESTO) 97-103 MG    ivabradine (CORLANOR) 5 MG TABS tablet    PCV ECHOCARDIOGRAM COMPLETE    LONG TERM MONITOR (3-14 DAYS)    Basic Metabolic Panel (BMET)  2. Chronic systolic heart failure (HCC)  I50.22 carvedilol (COREG) 25 MG tablet    sacubitril-valsartan (ENTRESTO) 97-103 MG    ivabradine (CORLANOR) 5 MG TABS tablet    PCV ECHOCARDIOGRAM COMPLETE    LONG TERM MONITOR (3-14 DAYS)    Basic Metabolic Panel (BMET)  3. History of CVA with residual deficit  I69.30 LONG TERM MONITOR (3-14 DAYS)  4. History of COVID-19  Z86.16      Medications Discontinued During This Encounter  Medication Reason   sacubitril-valsartan (ENTRESTO) 49-51 MG Dose change   carvedilol (COREG) 25 MG tablet Reorder    Meds ordered this encounter  Medications   carvedilol (COREG) 25 MG tablet  Sig: Take 1 tablet (25 mg total) by mouth 2 (two) times daily with a meal.    Dispense:  180 tablet    Refill:  3   sacubitril-valsartan (ENTRESTO) 97-103 MG    Sig: Take 1 tablet by mouth 2 (two) times daily.    Dispense:  180 tablet    Refill:  3   ivabradine (CORLANOR) 5 MG TABS tablet    Sig: Take 0.5 tablets (2.5 mg total) by mouth 2 (two) times  daily with a meal.    Dispense:  90 tablet    Refill:  3    Recommendations:   Jenesys B Cedillos is a 40 y.o. African-American female with hypertension, hyperlipidemia, uncontrolled diabetes mellitus, obesity and obstructive sleep apnea on CPAP and history of Covid pneumonia in January 2021 and also had pulmonary embolism during Covid infection.  She is also being evaluated for vasculitis. 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 Cardiomyopathy etiology likely stress cardiomyopathy vs significant coronary artery disease vs small PFO noted on TEE vs atrial fibrillation.   Patient presents for follow up of heart failure and medication titration. She is presently doing well without clinical signs of acute decompensated heart failure. She tolerating current guideline direct medical therapy well, however patient's heart rate remains >70 bpm despite 25 mg carvedilol twice daily. Therefore will add Corlanor 2.5 mg twice daily. Patient will continue to monitor heart rate at home. Will notify the office in 2 weeks if heart rate continues to be >70 bpm. Patient's renal function has returned to baseline, therefore will increase Entresto to 97/103 mg twice daily, will repeat BMP in 1 week.   As patient has been on guideline directed medical therapy for heart failure since hospitalization, will repeat echocardiogram in 1 month. Unfortunately previous cardiac monitor malfunctioned and therefore will place new monitor today. Patient will continue to refrain from driving pending results of cardiac event monitor. Will obtain 2-week Zio patch monitoring in order to evaluate for atrial fibrillation as potential underlying etiology of recent CVA. Patient's hypercoagulabilty workup is negative, however will continue Eliquis due to recurrent thromboembolic events.  If patient remains stable with medication titration, will plan  for ischemic evaluation following next office visit.    Alethia Berthold, PA-C 06/22/2020, 7:32 PM Office: [phone]

## 2020-06-22 ENCOUNTER — Ambulatory Visit: Payer: 59 | Admitting: Student

## 2020-06-22 ENCOUNTER — Encounter: Payer: Self-pay | Admitting: Student

## 2020-06-22 ENCOUNTER — Inpatient Hospital Stay: Payer: 59

## 2020-06-22 ENCOUNTER — Encounter: Payer: 59 | Admitting: Occupational Therapy

## 2020-06-22 ENCOUNTER — Other Ambulatory Visit: Payer: Self-pay

## 2020-06-22 ENCOUNTER — Ambulatory Visit: Payer: 59

## 2020-06-22 VITALS — BP 120/82 | HR 98 | Temp 97.3°F | Resp 16 | Ht 61.0 in | Wt 228.0 lb

## 2020-06-22 DIAGNOSIS — I6930 Unspecified sequelae of cerebral infarction: Secondary | ICD-10-CM

## 2020-06-22 DIAGNOSIS — R4701 Aphasia: Secondary | ICD-10-CM

## 2020-06-22 DIAGNOSIS — I5022 Chronic systolic (congestive) heart failure: Secondary | ICD-10-CM

## 2020-06-22 DIAGNOSIS — I420 Dilated cardiomyopathy: Secondary | ICD-10-CM

## 2020-06-22 DIAGNOSIS — Z8616 Personal history of COVID-19: Secondary | ICD-10-CM

## 2020-06-22 DIAGNOSIS — M6281 Muscle weakness (generalized): Secondary | ICD-10-CM | POA: Diagnosis not present

## 2020-06-22 DIAGNOSIS — R471 Dysarthria and anarthria: Secondary | ICD-10-CM

## 2020-06-22 DIAGNOSIS — R41841 Cognitive communication deficit: Secondary | ICD-10-CM

## 2020-06-22 MED ORDER — SACUBITRIL-VALSARTAN 97-103 MG PO TABS: 1.0000 | ORAL_TABLET | Freq: Two times a day (BID) | ORAL | 3 refills | Status: DC

## 2020-06-22 MED ORDER — CARVEDILOL 25 MG PO TABS: 25.0000 mg | ORAL_TABLET | Freq: Two times a day (BID) | ORAL | 3 refills | Status: DC

## 2020-06-22 MED ORDER — IVABRADINE HCL 5 MG PO TABS: 2.5000 mg | ORAL_TABLET | Freq: Two times a day (BID) | ORAL | 3 refills | Status: DC

## 2020-06-22 NOTE — Therapy (Addendum)
[facility] [address] [place], [name], [address] Phone: [phone]   Fax:  [phone]  Speech Language Pathology Treatment  Patient Details  Name: BRITTIN BELNAP MRN: [id number] Date of Birth: November 29, 1980 Referring Provider (SLP): (ref) Dr. Arlyn Leak, (Documentation PCP) Salvadore Oxford, MD   Encounter Date: 06/22/2020   End of Session - 06/22/20 1436    Visit Number 8    Number of Visits 17    Date for SLP Re-Evaluation 08/07/20    Authorization - Visit Number 8    Authorization - Number of Visits 10    SLP Start Time 7001    SLP Stop Time  1400    SLP Time Calculation (min) 42 min    Activity Tolerance Patient tolerated treatment well           Past Medical History:  Diagnosis Date  . Asthma   . COVID-19 long hauler   . Diabetes mellitus without complication ([place])   . Hypertension   . Migraines   . Obesity   . Pulmonary embolism ([place]) 05/2019   with COVID  . Sleep apnea     Past Surgical History:  Procedure Laterality Date  . BILATERAL CARPAL TUNNEL RELEASE    . TEE WITHOUT CARDIOVERSION N/A 03/28/2020   Procedure: TRANSESOPHAGEAL ECHOCARDIOGRAM (TEE);  Surgeon: Adrian Prows, MD;  Location: [facility];  Service: Cardiovascular;  Laterality: N/A;    There were no vitals filed for this visit.   Subjective Assessment - 06/22/20 1321    Subjective "We talked about the driving test. She told me what to do to remember some things."    Currently in Pain? No/denies                 ADULT SLP TREATMENT - 06/22/20 1322      General Information   Behavior/Cognition Alert;Cooperative;Pleasant mood      Treatment Provided   Treatment provided Cognitive-Linquistic      Cognitive-Linquistic Treatment   Treatment focused on Cognition, dysarthria   Skilled Treatment Cognition (17 minutes)Lynette recalls that one memory strategy was to associate and picture/visualization. SLP [place] also  suggested some cognitive tasks which were practical - pt has not been doing any of these practical tasks but has thought about organizing her email. Pt has been playing phone games like sudoku, wood sudoku, candy crush. (speech tx-individual) SLP had pt read common phrases and she self corrected 75% of the time at appropriate times with intelligibility at 80-85%- clarity improved to 90-100% as task progressed. SLP then had pt respond with 2-3 sentence responses and intelligibility was approx 90%. In conversation of non-emotional topics pt intelligilbiy was 95%, and decr'd in emotional topics, to 90%.      Assessment / Recommendations / Plan   Plan Continue with current plan of care      Progression Toward Goals   Progression toward goals Progressing toward goals            SLP Education - 06/22/20 1433    Education Details do other functional, practical cognitive tasks other than phone games    Person(s) Educated Patient    Methods Explanation    Comprehension Verbalized understanding            SLP Short Term Goals - 06/15/20 1526      SLP SHORT TERM GOAL #1   Title pt will perform dysarhthria HEP with rare min x2 consecutive sessions    Time 1    Period  Weeks   or 9 total sessions   Status Partially Met      SLP SHORT TERM GOAL #2   Title pt will complete aphasia testing PRN and goals added PRN    Status Deferred      SLP SHORT TERM GOAL #3   Title pt will undergo cognitive communication testing and goals added PRN    Status Achieved      SLP SHORT TERM GOAL #4   Title pt will use compensatory measures for speech intelligibilty of 95%+ in 10 minutes simple conversation x3 sessions    Time 1    Period Weeks    Status Not Met      SLP SHORT TERM GOAL #5   Title pt will demo awareness of errors, given self correction, in simple cognitive linguistic tasks in 3 sessions    Time 1    Period Weeks    Status Not Met      SLP SHORT TERM GOAL #6   Title pt will demonstrate  appropriate compensatory measures for decr'd attention skills in simple cognitve linguistic tasks    Time 1    Period Weeks    Status Deferred            SLP Long Term Goals - 06/22/20 1437      SLP LONG TERM GOAL #1   Title pt will use compensatory measures for speech intelligibilty of 95%+ in 10 minutes mod complex conversation, using self correction PRN x3 sessions    Time 3    Period Weeks   or 17 total sessions, for all LTGs   Status On-going      SLP LONG TERM GOAL #2   Title pt will use compensations for speech intelligiblity in 19/20 sentence responses resulting in 100% intelligbility in 3 sessions    Time 3    Period Weeks    Status On-going      SLP LONG TERM GOAL #3   Title pt will demonstrate appropriate anticipatory awareness in mod complex cognitive linguistic tasks by spontaneously self-correcting or double checking work 100% of the time in 3 sessions    Time 3    Period Weeks    Status On-going      SLP LONG TERM GOAL #4   Title pt will have 100% accuracy in simultaneous communicative tasks (simple divided attention) or make compensations for situations where she would have normally performed divided attention, in 3 sessions    Time 3    Period Weeks    Status On-going      SLP LONG TERM GOAL #5   Title in her last 1-2 visits, pt will demonstrate improved QOL score (</= 80) on speech QOL measure    Time 3    Period Weeks    Status On-going            Plan - 06/22/20 1437    Clinical Impression Statement Jenny continues to present with improving mild ataxic dysarthira and improving mild high level cognitive impairments of attention, memoyr, and awareness. Ongoing training in compensations for dysarthira and cognition. 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    Speech Therapy Frequency 2x / week     Duration 8 weeks   17 visitis   Treatment/Interventions Language facilitation;Cueing hierarchy;Cognitive reorganization;Internal/external aids;Patient/family education;Compensatory strategies;SLP instruction and feedback;Multimodal communcation approach;Functional tasks;Oral motor exercises  Potential to Achieve Goals Good           Patient will benefit from skilled therapeutic intervention in order to improve the following deficits and impairments:   Dysarthria and anarthria  Cognitive communication deficit  Aphasia    Problem List Patient Active Problem List   Diagnosis Date Noted  . Acute CVA (cerebrovascular accident) ([place]) 03/27/2020  . Sleep apnea   . Diabetes mellitus without complication ([place])   . COVID-19 long hauler   . Obesity   . Acute pulmonary embolism ([place]) 06/30/2019  . Pulmonary embolism ([place]) 05/2019  . Hypernatremia   . Asthma, chronic   . DKA (diabetic ketoacidoses) 11/27/2014  . Diabetes mellitus, new onset ([place])   . Hyperglycemic hyperosmolar nonketotic coma ([place])   . Essential hypertension     [place] ,[place], [place]  06/22/2020, 2:40 PM  [facility] [address] [place], [name], [address] Phone: [phone]   Fax:  [phone]   Name: HANIN DECOOK MRN: [id number] Date of Birth: 01-03-1981

## 2020-06-22 NOTE — Patient Instructions (Signed)
   Do some of those tasks Vernona Rieger put on that sheet - plan a weekend vacation, work on a meal plan, organize your inbox, etc - - instead of doing games on your phone.

## 2020-06-26 ENCOUNTER — Ambulatory Visit: Payer: 59

## 2020-06-26 ENCOUNTER — Other Ambulatory Visit: Payer: Self-pay

## 2020-06-26 DIAGNOSIS — R471 Dysarthria and anarthria: Secondary | ICD-10-CM

## 2020-06-26 DIAGNOSIS — R41841 Cognitive communication deficit: Secondary | ICD-10-CM

## 2020-06-26 DIAGNOSIS — M6281 Muscle weakness (generalized): Secondary | ICD-10-CM | POA: Diagnosis not present

## 2020-06-26 NOTE — Therapy (Signed)
[facility] [address] [place], [name], [address] Phone: [phone]   Fax:  [phone]  Speech Language Pathology Treatment  Patient Details  Name: Shelly Shields MRN: [id number] Date of Birth: 1980/10/04 Referring Provider (SLP): (ref) Dr. Arlyn Leak, (Documentation PCP) Salvadore Oxford, MD   Encounter Date: 06/26/2020   End of Session - 06/26/20 1403    Visit Number 9    Number of Visits 17    Date for SLP Re-Evaluation 08/07/20    Authorization - Visit Number 9    Authorization - Number of Visits 66    SLP Start Time 5643    SLP Stop Time  3295    SLP Time Calculation (min) 44 min    Activity Tolerance Patient tolerated treatment well           Past Medical History:  Diagnosis Date  . Asthma   . COVID-19 long hauler   . Diabetes mellitus without complication ([facility])   . Hypertension   . Migraines   . Obesity   . Pulmonary embolism ([facility]) 05/2019   with COVID  . Sleep apnea     Past Surgical History:  Procedure Laterality Date  . BILATERAL CARPAL TUNNEL RELEASE    . TEE WITHOUT CARDIOVERSION N/A 03/28/2020   Procedure: TRANSESOPHAGEAL ECHOCARDIOGRAM (TEE);  Surgeon: Adrian Prows, MD;  Location: [facility];  Service: Cardiovascular;  Laterality: N/A;    There were no vitals filed for this visit.   Subjective Assessment - 06/26/20 1324    Subjective "I did a lot of the sudoku."    Currently in Pain? No/denies                 ADULT SLP TREATMENT - 06/26/20 1325      General Information   Behavior/Cognition Alert;Cooperative;Pleasant mood      Treatment Provided   Treatment provided Cognitive-Linquistic      Cognitive-Linquistic Treatment   Treatment focused on Cognition;Dysarthria    Skilled Treatment (cognition 28 minutes) - SLP worked through detailed tasks with pt. Pt did not double check her work and did not note she missed three details leading to performing 3 errors until  SLP directed her back to task to look for errors, On the second task. pt worked slower and more deliberately however she did not double check errors closely enough to find errors on the second task until SLP asked pt if she would like to review her responses with SLP prior to writing the responses down. Even then pt still req'd cue to find a remaining error. 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 Pt stated that the system is built to detect errors so it would not matter. Decr'd reasoning/insight seen as pt then said the pharmacist wouldn't mind hearing thequality control alarm sound multiple times/day.  (dysarthria - speech tx ind) During pt reading of these detailed tasks pt made multiple articulation errors despite SLP modeling and consistent SLP cues to slow rate and overarticulate within each task.      Assessment / Recommendations / Plan   Plan Continue with current plan of care      Progression Toward Goals   Progression toward goals Not progressing toward goals (comment)   ? pt reasoning/insight into errors  SLP Education - 06/26/20 1402    Education Details how errors would affect pt at work    [employer]) Educated Patient    Methods Explanation    Comprehension Need further instruction            SLP Short Term Goals - 06/15/20 1526      SLP SHORT TERM GOAL #1   Title pt will perform dysarhthria HEP with rare min x2 consecutive sessions    Time 1    Period Weeks   or 9 total sessions   Status Partially Met      SLP SHORT TERM GOAL #2   Title pt will complete aphasia testing PRN and goals added PRN    Status Deferred      SLP SHORT TERM GOAL #3   Title pt will undergo cognitive communication testing and goals added PRN    Status Achieved      SLP SHORT TERM GOAL #4   Title pt will use  compensatory measures for speech intelligibilty of 95%+ in 10 minutes simple conversation x3 sessions    Time 1    Period Weeks    Status Not Met      SLP SHORT TERM GOAL #5   Title pt will demo awareness of errors, given self correction, in simple cognitive linguistic tasks in 3 sessions    Time 1    Period Weeks    Status Not Met      SLP SHORT TERM GOAL #6   Title pt will demonstrate appropriate compensatory measures for decr'd attention skills in simple cognitve linguistic tasks    Time 1    Period Weeks    Status Deferred            SLP Long Term Goals - 06/26/20 1407      SLP LONG TERM GOAL #1   Title pt will use compensatory measures for speech intelligibilty of 95%+ in 10 minutes mod complex conversation, using self correction PRN x3 sessions    Time 2    Period Weeks   or 17 total sessions, for all LTGs   Status On-going      SLP LONG TERM GOAL #2   Title pt will use compensations for speech intelligiblity in 19/20 sentence responses resulting in 100% intelligbility in 3 sessions    Time 2    Period Weeks    Status On-going      SLP LONG TERM GOAL #3   Title pt will demonstrate appropriate anticipatory awareness in mod complex cognitive linguistic tasks by spontaneously self-correcting or double checking work 100% of the time in 3 sessions    Time 2    Period Weeks    Status On-going      SLP LONG TERM GOAL #4   Title pt will have 100% accuracy in simultaneous communicative tasks (simple divided attention) or make compensations for situations where she would have normally performed divided attention, in 3 sessions    Time 2    Period Weeks    Status On-going      SLP LONG TERM GOAL #5   Title in her last 1-2 visits, pt will demonstrate improved QOL score (</= 80) on speech QOL measure    Time 2    Period Weeks    Status On-going            Plan - 06/26/20 1403    Clinical Impression Statement 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 Ongoing training in compensations for dysarthira and cognition. Progress in error awareness in practical tasks is more challenging for pt and she does not always double check her work. She cont to require cues for intelligibility (reduced error awarneess). 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    Speech Therapy Frequency 2x / week    Duration 8 weeks   17 visitis   Treatment/Interventions Language facilitation;Cueing hierarchy;Cognitive reorganization;Internal/external aids;Patient/family education;Compensatory strategies;SLP instruction and feedback;Multimodal communcation approach;Functional tasks;Oral motor exercises    Potential to Achieve Goals Good           Patient will benefit from skilled therapeutic intervention in order to improve the following deficits and impairments:   Cognitive communication deficit  Dysarthria and anarthria    Problem List Patient Active Problem List   Diagnosis Date Noted  . Acute CVA (cerebrovascular accident) ([facility]) 03/27/2020  . Sleep apnea   . Diabetes mellitus without complication ([facility])   . COVID-19 long hauler   . Obesity   . Acute pulmonary embolism ([facility]) 06/30/2019  . Pulmonary embolism ([facility]) 05/2019  . Hypernatremia   . Asthma, chronic   . DKA (diabetic ketoacidoses) 11/27/2014  . Diabetes mellitus, new onset ([facility])   . Hyperglycemic hyperosmolar nonketotic coma ([facility])   . Essential hypertension     [place] ,[place], [place]  06/26/2020, 2:08 PM  [facility] [address] [place], [name], [address] Phone: [phone]   Fax:  [phone]   Name: Shelly Shields MRN: [id number] Date of Birth: 04/22/1981

## 2020-06-26 NOTE — Patient Instructions (Signed)
  Please complete the assigned speech therapy homework prior to your next session and return it to the speech therapist at your next visit.  

## 2020-06-28 ENCOUNTER — Encounter: Payer: Self-pay | Admitting: Speech Pathology

## 2020-06-28 ENCOUNTER — Ambulatory Visit: Payer: 59 | Attending: Internal Medicine | Admitting: Speech Pathology

## 2020-06-28 ENCOUNTER — Other Ambulatory Visit: Payer: Self-pay

## 2020-06-28 DIAGNOSIS — R471 Dysarthria and anarthria: Secondary | ICD-10-CM

## 2020-06-28 DIAGNOSIS — R41841 Cognitive communication deficit: Secondary | ICD-10-CM | POA: Diagnosis present

## 2020-06-28 NOTE — Therapy (Signed)
[facility] [address] [place], [name], [address] Phone: [phone]   Fax:  [phone]  Speech Language Pathology Treatment  Patient Details  Name: Shelly Shields MRN: [id number] Date of Birth: Oct 29, 1980 Referring Provider (SLP): (ref) Dr. Arlyn Leak, (Documentation PCP) Salvadore Oxford, MD   Encounter Date: 06/28/2020   End of Session - 06/28/20 1402    Visit Number 10    Number of Visits 17    Date for SLP Re-Evaluation 08/07/20    Authorization - Visit Number 10    Authorization - Number of Visits 37    SLP Start Time 8786    SLP Stop Time  1400    SLP Time Calculation (min) 43 min    Activity Tolerance Patient tolerated treatment well           Past Medical History:  Diagnosis Date  . Asthma   . COVID-19 long hauler   . Diabetes mellitus without complication ([facility])   . Hypertension   . Migraines   . Obesity   . Pulmonary embolism ([facility]) 05/2019   with COVID  . Sleep apnea     Past Surgical History:  Procedure Laterality Date  . BILATERAL CARPAL TUNNEL RELEASE    . TEE WITHOUT CARDIOVERSION N/A 03/28/2020   Procedure: TRANSESOPHAGEAL ECHOCARDIOGRAM (TEE);  Surgeon: Adrian Prows, MD;  Location: [facility];  Service: Cardiovascular;  Laterality: N/A;    There were no vitals filed for this visit.   Subjective Assessment - 06/28/20 1325    Subjective "It was a lot" re: HW    Currently in Pain? No/denies                 ADULT SLP TREATMENT - 06/28/20 1327      General Information   Behavior/Cognition Alert;Cooperative;Pleasant mood      Treatment Provided   Treatment provided Cognitive-Linquistic      Cognitive-Linquistic Treatment   Treatment focused on Cognition;Dysarthria;Patient/family/caregiver education    Skilled Treatment Speech: Deshea reports carryover of strategies unless she gets upset. In structured tasks, Ravneet carried over strategie with rare min A. In  simple conversation over 5 minutes, Kelsy was intelligible with rare min A to reduce rate. She is IDing and self correcting speech errors with supervision cues. Cognition: Targeted divided attention between simple card sort and simple conversation, pt was 100% accurate in sort and self corrected speech with supervision cues simultaneously. She is verbalizing that she has to "double check" Nariah verbalized need to double check her HW.      Assessment / Recommendations / Plan   Plan Continue with current plan of care      Progression Toward Goals   Progression toward goals Progressing toward goals            SLP Education - 06/28/20 1400    Education Details double check work, reduce rate    Person(s) Educated Patient    Methods Explanation;Verbal cues    Comprehension Verbalized understanding;Returned demonstration;Verbal cues required            SLP Short Term Goals - 06/28/20 1402      SLP SHORT TERM GOAL #1   Title pt will perform dysarhthria HEP with rare min x2 consecutive sessions    Time 1    Period Weeks   or 9 total sessions   Status Partially Met      SLP SHORT TERM GOAL #2   Title pt will complete aphasia testing PRN and goals  added PRN    Status Deferred      SLP SHORT TERM GOAL #3   Title pt will undergo cognitive communication testing and goals added PRN    Status Achieved      SLP SHORT TERM GOAL #4   Title pt will use compensatory measures for speech intelligibilty of 95%+ in 10 minutes simple conversation x3 sessions    Time 1    Period Weeks    Status Not Met      SLP SHORT TERM GOAL #5   Title pt will demo awareness of errors, given self correction, in simple cognitive linguistic tasks in 3 sessions    Time 1    Period Weeks    Status Not Met      SLP SHORT TERM GOAL #6   Title pt will demonstrate appropriate compensatory measures for decr'd attention skills in simple cognitve linguistic tasks    Time 1    Period Weeks    Status Deferred             SLP Long Term Goals - 06/28/20 1402      SLP LONG TERM GOAL #1   Title pt will use compensatory measures for speech intelligibilty of 95%+ in 10 minutes mod complex conversation, using self correction PRN x3 sessions    Time 2    Period Weeks   or 17 total sessions, for all LTGs   Status On-going      SLP LONG TERM GOAL #2   Title pt will use compensations for speech intelligiblity in 19/20 sentence responses resulting in 100% intelligbility in 3 sessions    Time 2    Period Weeks    Status On-going      SLP LONG TERM GOAL #3   Title pt will demonstrate appropriate anticipatory awareness in mod complex cognitive linguistic tasks by spontaneously self-correcting or double checking work 100% of the time in 3 sessions    Time 2    Period Weeks    Status On-going      SLP LONG TERM GOAL #4   Title pt will have 100% accuracy in simultaneous communicative tasks (simple divided attention) or make compensations for situations where she would have normally performed divided attention, in 3 sessions    Time 2    Period Weeks    Status On-going      SLP LONG TERM GOAL #5   Title in her last 1-2 visits, pt will demonstrate improved QOL score (</= 80) on speech QOL measure    Time 2    Period Weeks    Status On-going            Plan - 06/28/20 1401    Clinical Impression Statement Mariena 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 Ongoing training in compensations for dysarthira and cognition. Progress in error awareness in practical tasks is more challenging for pt and she does not always double check her work. She cont to require cues for intelligibility (reduced error awarneess). 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    Speech Therapy Frequency 2x / week    Duration 8 weeks   17 visits    Treatment/Interventions Language facilitation;Cueing hierarchy;Cognitive reorganization;Internal/external aids;Patient/family education;Compensatory strategies;SLP instruction and feedback;Multimodal communcation approach;Functional tasks;Oral motor exercises    Potential to Achieve Goals Good  Patient will benefit from skilled therapeutic intervention in order to improve the following deficits and impairments:   Dysarthria and anarthria  Cognitive communication deficit    Problem List Patient Active Problem List   Diagnosis Date Noted  . Acute CVA (cerebrovascular accident) ([facility]) 03/27/2020  . Sleep apnea   . Diabetes mellitus without complication ([facility])   . COVID-19 long hauler   . Obesity   . Acute pulmonary embolism ([facility]) 06/30/2019  . Pulmonary embolism ([facility]) 05/2019  . Hypernatremia   . Asthma, chronic   . DKA (diabetic ketoacidoses) 11/27/2014  . Diabetes mellitus, new onset ([facility])   . Hyperglycemic hyperosmolar nonketotic coma ([facility])   . Essential hypertension     , Annye Rusk MS, [facility] 06/28/2020, 2:03 PM  [facility] [address] [place], [name], [address] Phone: [phone]   Fax:  [phone]   Name: TIANA SIVERTSON MRN: [id number] Date of Birth: [birth year]

## 2020-07-03 ENCOUNTER — Ambulatory Visit: Payer: 59

## 2020-07-03 ENCOUNTER — Other Ambulatory Visit: Payer: Self-pay

## 2020-07-03 DIAGNOSIS — R471 Dysarthria and anarthria: Secondary | ICD-10-CM | POA: Diagnosis not present

## 2020-07-03 DIAGNOSIS — R41841 Cognitive communication deficit: Secondary | ICD-10-CM

## 2020-07-03 NOTE — Patient Instructions (Signed)
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  Double check all bills, check writing, etc.

## 2020-07-03 NOTE — Therapy (Signed)
[facility] [address] [place], [name], [address] Phone: [phone]   Fax:  [phone]  Speech Language Pathology Treatment  Patient Details  Name: Shelly Shields MRN: [id number] Date of Birth: November 15, 1980 Referring Provider (SLP): (ref) Dr. Arlyn Leak, (Documentation PCP) Salvadore Oxford, MD   Encounter Date: 07/03/2020   End of Session - 07/03/20 1411    Authorization - Visit Number 11    Authorization - Number of Visits 53           Past Medical History:  Diagnosis Date  . Asthma   . COVID-19 long hauler   . Diabetes mellitus without complication ([facility])   . Hypertension   . Migraines   . Obesity   . Pulmonary embolism ([facility]) 05/2019   with COVID  . Sleep apnea     Past Surgical History:  Procedure Laterality Date  . BILATERAL CARPAL TUNNEL RELEASE    . TEE WITHOUT CARDIOVERSION N/A 03/28/2020   Procedure: TRANSESOPHAGEAL ECHOCARDIOGRAM (TEE);  Surgeon: Adrian Prows, MD;  Location: [facility];  Service: Cardiovascular;  Laterality: N/A;    There were no vitals filed for this visit.   Subjective Assessment - 07/03/20 1316    Subjective "I worry about going back to work" re:  speech demands    Currently in Pain? 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 SLP targeted functional scripting and role play for someone refilling prescription. Pt was approximately ~90% intelligible with occassional verbal and  visual cues provided to slow rate of speech. SLP targeted functional problem solving for medicine management at work, in which pt able to identify appropriate solutions with min A. SLP educated patient on attention techniques and importance of double checking work, in which pt verbalized understanding.      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    Person(s) Educated Patient    Methods Explanation;Demonstration;Verbal cues    Comprehension Verbalized understanding;Returned demonstration;Verbal cues required            SLP Short Term Goals - 07/03/20 1408      SLP SHORT TERM GOAL #1   Title pt will perform dysarhthria HEP with rare min x2 consecutive sessions    Time 1    Period Weeks   or 9 total sessions   Status Partially Met      SLP SHORT TERM GOAL #2   Title pt will complete aphasia testing PRN and goals added PRN    Status Deferred      SLP SHORT TERM GOAL #3   Title pt will undergo cognitive communication testing and goals added PRN  Status Achieved      SLP SHORT TERM GOAL #4   Title pt will use compensatory measures for speech intelligibilty of 95%+ in 10 minutes simple conversation x3 sessions    Time 1    Period Weeks    Status On-going      SLP SHORT TERM GOAL #5   Title pt will demo awareness of errors, given self correction, in simple cognitive linguistic tasks in 3 sessions    Time 1    Period Weeks    Status On-going      SLP SHORT TERM GOAL #6   Title pt will demonstrate appropriate compensatory measures for decr'd attention skills in simple cognitve linguistic tasks    Time 1    Period Weeks    Status Deferred            SLP Long Term Goals - 07/03/20  1409      SLP LONG TERM GOAL #1   Title pt will use compensatory measures for speech intelligibilty of 95%+ in 10 minutes mod complex conversation, using self correction PRN x3 sessions    Time 2    Period Weeks   or 17 total sessions, for all LTGs   Status On-going      SLP LONG TERM GOAL #2   Title pt will use compensations for speech intelligiblity in 19/20 sentence responses resulting in 100% intelligbility in 3 sessions    Time 2    Period Weeks    Status On-going      SLP LONG TERM GOAL #3   Title pt will demonstrate appropriate anticipatory awareness in mod complex cognitive linguistic tasks by spontaneously self-correcting or double checking work 100% of the time in 3 sessions    Time 2    Period Weeks    Status On-going      SLP LONG TERM GOAL #4   Title pt will have 100% accuracy in simultaneous communicative tasks (simple divided attention) or make compensations for situations where she would have normally performed divided attention, in 3 sessions    Time 2    Period Weeks    Status On-going      SLP LONG TERM GOAL #5   Title in her last 1-2 visits, pt will demonstrate improved QOL score (</= 80) on speech QOL measure    Time 2    Period Weeks    Status On-going            Plan - 07/03/20 1415    Clinical Impression Statement Shelly Shields 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 Pt able to recall compensatory speech strategies from previous ST sessions. 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 Some reduced functional problem solving and error awareness exhibited for targeted work scenarios. Pt would continue to benefit from skilled ST to address dysarthria and cognition to maximize functional independence.           Patient will benefit from skilled therapeutic intervention in order to improve the  following deficits and impairments:   Dysarthria and anarthria  Cognitive communication deficit    Problem List Patient Active Problem List   Diagnosis Date Noted  . Acute CVA (cerebrovascular accident) ([facility]) 03/27/2020  . Sleep apnea   . Diabetes mellitus without complication ([facility])   . COVID-19 long hauler   . Obesity   . Acute pulmonary embolism ([facility])  06/30/2019  . Pulmonary embolism ([facility]) 05/2019  . Hypernatremia   . Asthma, chronic   . DKA (diabetic ketoacidoses) 11/27/2014  . Diabetes mellitus, new onset ([facility])   . Hyperglycemic hyperosmolar nonketotic coma ([facility])   . Essential hypertension    Alinda Deem, MA CCC-SLP  Alinda Deem 07/03/2020, 2:17 PM  [facility] [address] [place], [name], [address] Phone: [phone]   Fax:  [phone]   Name: Shelly Shields MRN: [id number] Date of Birth: 1980-10-13

## 2020-07-04 ENCOUNTER — Other Ambulatory Visit: Payer: Self-pay | Admitting: Student

## 2020-07-04 ENCOUNTER — Telehealth: Payer: Self-pay

## 2020-07-04 DIAGNOSIS — I5022 Chronic systolic (congestive) heart failure: Secondary | ICD-10-CM

## 2020-07-04 DIAGNOSIS — I420 Dilated cardiomyopathy: Secondary | ICD-10-CM

## 2020-07-04 NOTE — Telephone Encounter (Signed)
Patient Corlan[facility] has been approved through 07/04/21

## 2020-07-05 ENCOUNTER — Other Ambulatory Visit: Payer: Self-pay

## 2020-07-05 ENCOUNTER — Ambulatory Visit: Payer: 59

## 2020-07-05 DIAGNOSIS — R471 Dysarthria and anarthria: Secondary | ICD-10-CM

## 2020-07-05 DIAGNOSIS — R41841 Cognitive communication deficit: Secondary | ICD-10-CM

## 2020-07-05 NOTE — Therapy (Signed)
[facility] [address] [place], [name], [address] Phone: [phone]   Fax:  [phone]  Speech Language Pathology Treatment  Patient Details  Name: Shelly Shields MRN: [id number] Date of Birth: 1981-04-28 Referring Provider (SLP): (ref) Dr. Arlyn Leak, (Documentation PCP) Salvadore Oxford, MD   Encounter Date: 07/05/2020   End of Session - 07/05/20 1358    Visit Number 12    Number of Visits 17    Date for SLP Re-Evaluation 08/07/20    Authorization - Visit Number 12    Authorization - Number of Visits 23    SLP Start Time 1315    SLP Stop Time  1400    SLP Time Calculation (min) 45 min    Activity Tolerance Patient tolerated treatment well           Past Medical History:  Diagnosis Date  . Asthma   . COVID-19 long hauler   . Diabetes mellitus without complication ([place])   . Hypertension   . Migraines   . Obesity   . Pulmonary embolism ([place]) 05/2019   with COVID  . Sleep apnea     Past Surgical History:  Procedure Laterality Date  . BILATERAL CARPAL TUNNEL RELEASE    . TEE WITHOUT CARDIOVERSION N/A 03/28/2020   Procedure: TRANSESOPHAGEAL ECHOCARDIOGRAM (TEE);  Surgeon: Adrian Prows, MD;  Location: [facility];  Service: Cardiovascular;  Laterality: N/A;    There were no vitals filed for this visit.   Subjective Assessment - 07/05/20 1316    Subjective "I have been playing some games that make me strategize" re: games on phone    Currently in Pain? No/denies                 ADULT SLP TREATMENT - 07/05/20 1324      General Information   Behavior/Cognition Alert;Cooperative;Pleasant mood      Treatment Provided   Treatment provided Cognitive-Linquistic      Cognitive-Linquistic Treatment   Treatment focused on Cognition;Dysarthria;Patient/family/caregiver education    Skilled Treatment Pt reported having completed dysathria HWK for role playing functional conversations with good  sucess reported for rate. SLP targeted functional phrases for home/work to practice speech compensations at sentence level and at conversation level. Pt deemed to be approximately ~100% intellgible at phrase/sentence level and ~90-95% intellgible in conversation. Good rate of speech noted without cues. Occasional imprecise articulation noted for reading aloud task. Pt also stated "I sound robotic." SLP provided recommendations to improve natural flow of speech and education re: over-articulation. Pt noted with inattention x1 and reduced recall of auditory information x1. SLP to target more cognitive tasks in upcoming sessions. Pt reports independent use of pill organizer at home with success.      Assessment / Recommendations / Plan   Plan Continue with current plan of care      Progression Toward Goals   Progression toward goals Progressing toward goals            SLP Education - 07/05/20 1347    Education Details double check med management, speak slowly, practice functional phrases    Person(s) Educated Patient    Methods Explanation;Demonstration;Verbal cues    Comprehension Verbalized understanding;Returned demonstration            SLP Short Term Goals - 07/05/20 1416      SLP SHORT TERM GOAL #1   Title pt will perform dysarhthria HEP with rare min x2 consecutive sessions    Time 1  Period Weeks   or 9 total sessions   Status Partially Met      SLP SHORT TERM GOAL #2   Title pt will complete aphasia testing PRN and goals added PRN    Status Deferred      SLP SHORT TERM GOAL #3   Title pt will undergo cognitive communication testing and goals added PRN    Status Achieved      SLP SHORT TERM GOAL #4   Title pt will use compensatory measures for speech intelligibilty of 95%+ in 10 minutes simple conversation x3 sessions    Baseline 07-04-20    Time 1    Period Weeks    Status On-going      SLP SHORT TERM GOAL #5   Title pt will demo awareness of errors, given self  correction, in simple cognitive linguistic tasks in 3 sessions    Time 1    Period Weeks    Status On-going      SLP SHORT TERM GOAL #6   Title pt will demonstrate appropriate compensatory measures for decr'd attention skills in simple cognitve linguistic tasks    Time 1    Period Weeks    Status Deferred            SLP Long Term Goals - 07/05/20 1416      SLP LONG TERM GOAL #1   Title pt will use compensatory measures for speech intelligibilty of 95%+ in 10 minutes mod complex conversation, using self correction PRN x3 sessions    Baseline 07-04-20    Time 2    Period Weeks   or 17 total sessions, for all LTGs   Status On-going      SLP LONG TERM GOAL #2   Title pt will use compensations for speech intelligiblity in 19/20 sentence responses resulting in 100% intelligbility in 3 sessions    Baseline 07-04-20    Time 2    Period Weeks    Status On-going      SLP LONG TERM GOAL #3   Title pt will demonstrate appropriate anticipatory awareness in mod complex cognitive linguistic tasks by spontaneously self-correcting or double checking work 100% of the time in 3 sessions    Time 2    Period Weeks    Status On-going      SLP LONG TERM GOAL #4   Title pt will have 100% accuracy in simultaneous communicative tasks (simple divided attention) or make compensations for situations where she would have normally performed divided attention, in 3 sessions    Time 2    Period Weeks    Status On-going      SLP LONG TERM GOAL #5   Title in her last 1-2 visits, pt will demonstrate improved QOL score (</= 80) on speech QOL measure    Time 2    Period Weeks    Status On-going            Plan - 07/05/20 1410    Clinical Impression Statement 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 Pt presented with improved rate of speech this session at phrase/sentence levels and during mod complex conversation with rare min A.  Occasional imprecise articulation exhibited this session. MIld inattention and decreased recall noted this session, in which further cognitive tasks to be addressed in upcoming sessions. Pt would benefit from further skilled ST services to address dysathria and cognition to assist patient with return to PLOF.  Speech Therapy Frequency 2x / week    Duration 8 weeks    Treatment/Interventions Compensatory strategies;Functional tasks;Patient/family education;SLP instruction and feedback    Potential to Achieve Goals Good    Potential Considerations Ability to learn/carryover information;Cooperation/participation level    Consulted and Agree with Plan of Care Patient           Patient will benefit from skilled therapeutic intervention in order to improve the following deficits and impairments:   Dysarthria and anarthria  Cognitive communication deficit    Problem List Patient Active Problem List   Diagnosis Date Noted  . Acute CVA (cerebrovascular accident) ([facility]) 03/27/2020  . Sleep apnea   . Diabetes mellitus without complication ([facility])   . COVID-19 long hauler   . Obesity   . Acute pulmonary embolism ([place]) 06/30/2019  . Pulmonary embolism ([facility]) 05/2019  . Hypernatremia   . Asthma, chronic   . DKA (diabetic ketoacidoses) 11/27/2014  . Diabetes mellitus, new onset ([facility])   . Hyperglycemic hyperosmolar nonketotic coma ([place])   . Essential hypertension     Alinda Deem, MA CCC-SLP 07/05/2020, 2:32 PM  [facility] [address] [place], [name], [address] Phone: [phone]   Fax:  [phone]   Name: MCKINZE POIRIER MRN: [id number] Date of Birth: 03-Mar-1981

## 2020-07-05 NOTE — Patient Instructions (Signed)
Functional phrases to practice:   Home:  1) King, please come here so I can change your diaper. 2) Good morning, Brandon.  3) Please take the trash out.  4) Can you get the phone please? 5) Where are you going to? 6) Can you please put the baby to bed? 7) Can you feed the baby? 8) What's for dinner tonight? 9) Are you going to watch the football game? 10) When are you going to wash clothes?  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  2) What medication do you need to refill?   3) What is date of birth and last name?  4) When do you want to come back? 5) What is your address? 6) Do you have any questions for the pharmacist? 7) Do you have any allergies to medication?  8) Do you have any health conditions? 9) Do you have an insurance card? 10) Hope you have a great day!

## 2020-07-08 LAB — BASIC METABOLIC PANEL
BUN/Creatinine Ratio: 11 (ref 9–23)
BUN: 10 mg/dL (ref 6–24)
CO2: 16 mmol/L — ABNORMAL LOW (ref 20–29)
Calcium: 9 mg/dL (ref 8.7–10.2)
Chloride: 113 mmol/L — ABNORMAL HIGH (ref 96–106)
Creatinine, Ser: 0.95 mg/dL (ref 0.57–1.00)
GFR calc Af Amer: 87 mL/min/{1.73_m2} (ref >59)
GFR calc non Af Amer: 75 mL/min/{1.73_m2} (ref >59)
Glucose: 116 mg/dL — ABNORMAL HIGH (ref 65–99)
Potassium: 3.9 mmol/L (ref 3.5–5.2)
Sodium: 142 mmol/L (ref 134–144)

## 2020-07-10 ENCOUNTER — Other Ambulatory Visit: Payer: Self-pay

## 2020-07-10 ENCOUNTER — Ambulatory Visit: Payer: 59

## 2020-07-10 DIAGNOSIS — R471 Dysarthria and anarthria: Secondary | ICD-10-CM | POA: Diagnosis not present

## 2020-07-10 DIAGNOSIS — R41841 Cognitive communication deficit: Secondary | ICD-10-CM

## 2020-07-10 NOTE — Progress Notes (Signed)
Please inform patient renal function is stable with increased Entresto, advise her to continue.

## 2020-07-10 NOTE — Progress Notes (Signed)
Called and spoke with pt regarding lbs results. Pt voiced understanding.

## 2020-07-10 NOTE — Therapy (Signed)
[facility] [address] [place], [name], [address] Phone: [phone]   Fax:  [phone]  Speech Language Pathology Treatment  Patient Details  Name: Shelly Shields MRN: [id number] Date of Birth: [birth year] Referring Provider (SLP): (ref) Dr. Arlyn Leak, (Documentation PCP) Salvadore Oxford, MD   Encounter Date: 07/10/2020   End of Session - 07/10/20 1422    Visit Number 13    Number of Visits 17    Date for SLP Re-Evaluation 08/07/20    Authorization - Visit Number 68    Authorization - Number of Visits 53    SLP Start Time 9794    SLP Stop Time  1402    SLP Time Calculation (min) 40 min    Activity Tolerance Patient tolerated treatment well           Past Medical History:  Diagnosis Date  . Asthma   . COVID-19 long hauler   . Diabetes mellitus without complication ([facility])   . Hypertension   . Migraines   . Obesity   . Pulmonary embolism ([facility]) 05/2019   with COVID  . Sleep apnea     Past Surgical History:  Procedure Laterality Date  . BILATERAL CARPAL TUNNEL RELEASE    . TEE WITHOUT CARDIOVERSION N/A 03/28/2020   Procedure: TRANSESOPHAGEAL ECHOCARDIOGRAM (TEE);  Surgeon: Adrian Prows, MD;  Location: [facility];  Service: Cardiovascular;  Laterality: N/A;    There were no vitals filed for this visit.   Subjective Assessment - 07/10/20 1330    Subjective "Like doing the games on my phone? Yes, I'm doing that."    Currently in Pain? No/denies                 ADULT SLP TREATMENT - 07/10/20 1336      General Information   Behavior/Cognition Alert;Cooperative;Pleasant mood      Treatment Provided   Treatment provided Cognitive-Linquistic      Cognitive-Linquistic Treatment   Treatment focused on Cognition;Dysarthria;Patient/family/caregiver education    Skilled Treatment SLP began with pt reading 10+ word sentences targeting slower and more exaggerated speech. Pt with notably slowed  rate necessary for improved articulation 65% of the time. SLP usual verbal and modeling cues to reduce rate did not appear to help pt incr her intellligibility. Pt omitting syllables, phonemes regularly.SLP cut pt a reading window and pt agreed with SLP that her reading was more fluid using the window because it forced her to slow down and read every word. SLP provided homework for pt to practice slower rate and more articulate speaking.      Assessment / Recommendations / Plan   Plan Continue with current plan of care      Progression Toward Goals   Progression toward goals Progressing toward goals            SLP Education - 07/10/20 1421    Education Details using reading window slows pt down and this is helpful in her reading every word in a sentence instead of skipping phonemes and words -and reading words incorrectly    Person(s) Educated Patient    Methods Explanation;Demonstration    Comprehension Verbalized understanding;Need further instruction            SLP Short Term Goals - 07/10/20 1424      SLP SHORT TERM GOAL #1   Title pt will perform dysarhthria HEP with rare min x2 consecutive sessions    Period --   or 9 total sessions  Status Partially Met      SLP SHORT TERM GOAL #2   Title pt will complete aphasia testing PRN and goals added PRN    Status Deferred      SLP SHORT TERM GOAL #3   Title pt will undergo cognitive communication testing and goals added PRN    Status Achieved      SLP SHORT TERM GOAL #4   Title pt will use compensatory measures for speech intelligibilty of 95%+ in 10 minutes simple conversation x3 sessions    Baseline 07-04-20    Status Partially Met      SLP SHORT TERM GOAL #5   Title pt will demo awareness of errors, given self correction, in simple cognitive linguistic tasks in 3 sessions    Status Partially Met      SLP SHORT TERM GOAL #6   Title pt will demonstrate appropriate compensatory measures for decr'd attention skills in  simple cognitve linguistic tasks    Time 1    Period Weeks    Status Deferred            SLP Long Term Goals - 07/10/20 1424      SLP LONG TERM GOAL #1   Title pt will use compensatory measures for speech intelligibilty of 95%+ in 10 minutes mod complex conversation, using self correction PRN x3 sessions    Baseline 07-04-20    Time 1    Period Weeks   or 17 total sessions, for all LTGs   Status On-going      SLP LONG TERM GOAL #2   Title pt will use compensations for speech intelligiblity in 19/20 sentence responses resulting in 100% intelligbility in 3 sessions    Baseline 07-04-20    Time 1    Period Weeks    Status On-going      SLP LONG TERM GOAL #3   Title pt will demonstrate appropriate anticipatory awareness in mod complex cognitive linguistic tasks by spontaneously self-correcting or double checking work 100% of the time in 3 sessions    Time 1    Period Weeks    Status On-going      SLP LONG TERM GOAL #4   Title pt will have 100% accuracy in simultaneous communicative tasks (simple divided attention) or make compensations for situations where she would have normally performed divided attention, in 3 sessions    Time 1    Period Weeks    Status On-going      SLP LONG TERM GOAL #5   Title in her last 1-2 visits, pt will demonstrate improved QOL score (</= 80) on speech QOL measure    Time 1    Period Weeks    Status On-going            Plan - 07/10/20 1423    Clinical Impression Statement 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 Occasional imprecise articulation exhibited this session. MIld inattention and decreased recall noted this session, however pt states she is more concerned about her speech at this time. Pt would benefit from further skilled ST services to address dysathria and cognition to assist patient with return to PLOF.    Speech Therapy Frequency 2x / week    Duration 8 weeks     Treatment/Interventions Compensatory strategies;Functional tasks;Patient/family education;SLP instruction and feedback    Potential to Achieve Goals Good    Potential Considerations Ability to learn/carryover information;Cooperation/participation level  Consulted and Agree with Plan of Care Patient           Patient will benefit from skilled therapeutic intervention in order to improve the following deficits and impairments:   Dysarthria and anarthria  Cognitive communication deficit    Problem List Patient Active Problem List   Diagnosis Date Noted  . Acute CVA (cerebrovascular accident) ([facility]) 03/27/2020  . Sleep apnea   . Diabetes mellitus without complication ([facility])   . COVID-19 long hauler   . Obesity   . Acute pulmonary embolism ([facility]) 06/30/2019  . Pulmonary embolism ([facility]) 05/2019  . Hypernatremia   . Asthma, chronic   . DKA (diabetic ketoacidoses) 11/27/2014  . Diabetes mellitus, new onset ([place])   . Hyperglycemic hyperosmolar nonketotic coma ([facility])   . Essential hypertension     [place] ,[place], [place]  07/10/2020, 2:26 PM  [facility] [address] [place], [name], [address] Phone: [phone]   Fax:  [phone]   Name: Shelly Shields MRN: [id number] Date of Birth: 1981-04-06

## 2020-07-10 NOTE — Patient Instructions (Signed)
  Please complete the assigned speech therapy homework prior to your next session and return it to the speech therapist at your next visit.  

## 2020-07-12 ENCOUNTER — Ambulatory Visit: Payer: 59

## 2020-07-12 ENCOUNTER — Other Ambulatory Visit: Payer: Self-pay

## 2020-07-12 DIAGNOSIS — R41841 Cognitive communication deficit: Secondary | ICD-10-CM

## 2020-07-12 DIAGNOSIS — R471 Dysarthria and anarthria: Secondary | ICD-10-CM | POA: Diagnosis not present

## 2020-07-12 NOTE — Therapy (Signed)
[facility] [address] [place], [name], [address] Phone: [phone]   Fax:  [phone]  Speech Language Pathology Treatment  Patient Details  Name: Shelly Shields MRN: [id number] Date of Birth: 16-Apr-1981 Referring Provider (SLP): (ref) Dr. Arlyn Leak, (Documentation PCP) Salvadore Oxford, MD   Encounter Date: 07/12/2020   End of Session - 07/12/20 1409    SLP Start Time 1315    SLP Stop Time  7654    SLP Time Calculation (min) 48 min           Past Medical History:  Diagnosis Date  . Asthma   . COVID-19 long hauler   . Diabetes mellitus without complication ([facility])   . Hypertension   . Migraines   . Obesity   . Pulmonary embolism ([facility]) 05/2019   with COVID  . Sleep apnea     Past Surgical History:  Procedure Laterality Date  . BILATERAL CARPAL TUNNEL RELEASE    . TEE WITHOUT CARDIOVERSION N/A 03/28/2020   Procedure: TRANSESOPHAGEAL ECHOCARDIOGRAM (TEE);  Surgeon: Adrian Prows, MD;  Location: [facility];  Service: Cardiovascular;  Laterality: N/A;    There were no vitals filed for this visit.   Subjective Assessment - 07/12/20 1311    Subjective "he recorded me doing reading sentences. I sounded more normal" re: speech    Currently in Pain? No/denies    Pain Score 0-No pain                 ADULT SLP TREATMENT - 07/12/20 1316      General Information   Behavior/Cognition Alert;Cooperative;Pleasant mood      Treatment Provided   Treatment provided Cognitive-Linquistic      Cognitive-Linquistic Treatment   Treatment focused on Cognition;Dysarthria;Patient/family/caregiver education    Skilled Treatment SLP targeted functional reading aloud with emphasis on reading slowly and accurately. Pt read aloud paragraphs x3 (5-8 sentences) with occasional imprecise articulation, omitted articles x2, and swapped word x1 (ex: "amount" for "number"). Pt indicated she had baseline reading deficits  (frequently swapped words & read fast) prior to COVID/CVA. Increased dysfluency and articulation errors exhibited in opening conversation, which mildly reduced with verbal cues for slower rate and processing. SLP targeted thought organization and processing for functional cognitive task related to scheduling, in which pt completed both tasks with 90% accuracy. Pt self-reported double checking both tasks without SLP prompting; however, errors x2 exhibited. Pt able to correct errors x2 given min SLP verbal prompt to attend to specific details. 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      Assessment / Recommendations / Plan   Plan Continue with current plan of care      Progression Toward Goals   Progression toward goals Progressing toward goals            SLP Education - 07/12/20 1345    Education Details double checking work, techniques to improve reading rate and accuracy    Person(s) Educated Patient    Methods Explanation;Demonstration;Verbal cues    Comprehension Verbalized understanding;Returned demonstration;Need further instruction            SLP Short Term Goals - 07/12/20 1309      SLP SHORT TERM GOAL #1   Title pt will perform dysarhthria HEP with rare min x2 consecutive sessions    Period --   or 9 total sessions   Status Partially Met      SLP SHORT  TERM GOAL #2   Title pt will complete aphasia testing PRN and goals added PRN    Status Deferred      SLP SHORT TERM GOAL #3   Title pt will undergo cognitive communication testing and goals added PRN    Status Achieved      SLP SHORT TERM GOAL #4   Title pt will use compensatory measures for speech intelligibilty of 95%+ in 10 minutes simple conversation x3 sessions    Baseline 07-04-20    Status Partially Met      SLP SHORT TERM GOAL #5   Title pt will demo awareness of errors, given self correction, in simple cognitive linguistic tasks in 3  sessions    Status Partially Met      SLP SHORT TERM GOAL #6   Title pt will demonstrate appropriate compensatory measures for decr'd attention skills in simple cognitve linguistic tasks    Time 1    Period Weeks    Status Deferred            SLP Long Term Goals - 07/12/20 1310      SLP LONG TERM GOAL #1   Title pt will use compensatory measures for speech intelligibilty of 95%+ in 10 minutes mod complex conversation, using self correction PRN x3 sessions    Baseline 07-04-20    Time 1    Period Weeks   or 17 total sessions, for all LTGs   Status On-going      SLP LONG TERM GOAL #2   Title pt will use compensations for speech intelligiblity in 19/20 sentence responses resulting in 100% intelligbility in 3 sessions    Baseline 07-04-20    Time 1    Period Weeks    Status On-going      SLP LONG TERM GOAL #3   Title pt will demonstrate appropriate anticipatory awareness in mod complex cognitive linguistic tasks by spontaneously self-correcting or double checking work 100% of the time in 3 sessions    Baseline 07-12-20    Time 1    Period Weeks    Status On-going      SLP LONG TERM GOAL #4   Title pt will have 100% accuracy in simultaneous communicative tasks (simple divided attention) or make compensations for situations where she would have normally performed divided attention, in 3 sessions    Time 1    Period Weeks    Status On-going      SLP LONG TERM GOAL #5   Title in her last 1-2 visits, pt will demonstrate improved QOL score (</= 80) on speech QOL measure    Time 1    Period Weeks    Status On-going            Plan - 07/12/20 1345    Clinical Impression Statement 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 Occasional imprecise articulation and swapped words x1 exhibited during for reading aloud tasks. Articulation errors and rushed speech exhibited in conversation intially, seemingly related to  anxiety. Cued slow rate of speech and breathing at commas/periods was mildly effective in reducing errors while reading. MIld inattention and decreased error awareness noted on cognitive task, despite patient double checking work. Pt would benefit from further skilled ST services to address dysathria and cognition to assist patient with return to PLOF.    Speech Therapy Frequency 2x / week    Duration 8 weeks    Treatment/Interventions Compensatory strategies;Functional  tasks;Patient/family education;SLP instruction and feedback    Potential to Achieve Goals Good    Potential Considerations Co-morbidities    Consulted and Agree with Plan of Care Patient           Patient will benefit from skilled therapeutic intervention in order to improve the following deficits and impairments:   Dysarthria and anarthria  Cognitive communication deficit    Problem List Patient Active Problem List   Diagnosis Date Noted  . Acute CVA (cerebrovascular accident) ([facility]) 03/27/2020  . Sleep apnea   . Diabetes mellitus without complication ([facility])   . COVID-19 long hauler   . Obesity   . Acute pulmonary embolism ([facility]) 06/30/2019  . Pulmonary embolism ([facility]) 05/2019  . Hypernatremia   . Asthma, chronic   . DKA (diabetic ketoacidoses) 11/27/2014  . Diabetes mellitus, new onset ([place])   . Hyperglycemic hyperosmolar nonketotic coma ([facility])   . Essential hypertension     Alinda Deem, MA CCC-SLP 07/12/2020, 2:10 PM  [facility] [address] [place], [name], [address] Phone: [phone]   Fax:  [phone]   Name: Shelly Shields MRN: [id number] Date of Birth: 1980/06/21

## 2020-07-12 NOTE — Patient Instructions (Signed)
  Complete calculation tasks with emphasis on accuracy and double checking work with increasing ability find errors by yourself.

## 2020-07-17 ENCOUNTER — Ambulatory Visit: Payer: 59

## 2020-07-17 ENCOUNTER — Other Ambulatory Visit: Payer: Self-pay

## 2020-07-17 DIAGNOSIS — R471 Dysarthria and anarthria: Secondary | ICD-10-CM | POA: Diagnosis not present

## 2020-07-17 DIAGNOSIS — R41841 Cognitive communication deficit: Secondary | ICD-10-CM

## 2020-07-17 NOTE — Therapy (Signed)
[facility] [address] [place], [name], [address] Phone: [phone]   Fax:  [phone]  Speech Language Pathology Treatment  Patient Details  Name: Shelly Shields MRN: [id number] Date of Birth: 05/17/1981 Referring Provider (SLP): (ref) Dr. Arlyn Leak, (Documentation PCP) Salvadore Oxford, MD   Encounter Date: 07/17/2020   End of Session - 07/17/20 1405    SLP Start Time 1315    SLP Stop Time  1324    SLP Time Calculation (min) 43 min           Past Medical History:  Diagnosis Date  . Asthma   . COVID-19 long hauler   . Diabetes mellitus without complication ([facility])   . Hypertension   . Migraines   . Obesity   . Pulmonary embolism ([facility]) 05/2019   with COVID  . Sleep apnea     Past Surgical History:  Procedure Laterality Date  . BILATERAL CARPAL TUNNEL RELEASE    . TEE WITHOUT CARDIOVERSION N/A 03/28/2020   Procedure: TRANSESOPHAGEAL ECHOCARDIOGRAM (TEE);  Surgeon: Adrian Prows, MD;  Location: [facility];  Service: Cardiovascular;  Laterality: N/A;    There were no vitals filed for this visit.   Subjective Assessment - 07/17/20 1315    Subjective "It went alright" re: HWK    Currently in Pain? No/denies    Pain Score 0-No pain                 ADULT SLP TREATMENT - 07/17/20 1318      General Information   Behavior/Cognition Alert;Cooperative;Pleasant mood      Treatment Provided   Treatment provided Cognitive-Linquistic      Cognitive-Linquistic Treatment   Treatment focused on Dysarthria;Cognition;Patient/family/caregiver education    Skilled Treatment SLP reviewed homework with pt achieving 90% accuracy even with double checking. 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 Pt stated "everybody says they can understand me better but I don't sound like myself." SLP recorded patient in conversation, with  intermittent fast rate and imprecise articulation noted. SLP provided dysarthria HEP with compensations and tongue twisters to address oral motor incoordination. Pt able to correct speech errors for practiced tongue twisters, with ~75% accuracy.      Assessment / Recommendations / Plan   Plan Continue with current plan of care      Progression Toward Goals   Progression toward goals Progressing toward goals            SLP Education - 07/17/20 1347    Education Details dysathria HEP, speech compensations    Person(s) Educated Patient    Methods Explanation;Demonstration;Verbal cues    Comprehension Verbalized understanding;Returned demonstration;Need further instruction            SLP Short Term Goals - 07/17/20 1311      SLP SHORT TERM GOAL #1   Title pt will perform dysarhthria HEP with rare min x2 consecutive sessions    Period --   or 9 total sessions   Status Partially Met      SLP SHORT TERM GOAL #2   Title pt will complete aphasia testing PRN and goals added PRN    Status Deferred      SLP SHORT TERM GOAL #3   Title pt will undergo cognitive communication testing and goals added PRN    Status Achieved      SLP SHORT TERM GOAL #4   Title pt will use  compensatory measures for speech intelligibilty of 95%+ in 10 minutes simple conversation x3 sessions    Baseline 07-04-20    Status Partially Met      SLP SHORT TERM GOAL #5   Title pt will demo awareness of errors, given self correction, in simple cognitive linguistic tasks in 3 sessions    Status Partially Met      SLP SHORT TERM GOAL #6   Title pt will demonstrate appropriate compensatory measures for decr'd attention skills in simple cognitve linguistic tasks    Time 1    Period Weeks    Status Deferred            SLP Long Term Goals - 07/17/20 1312      SLP LONG TERM GOAL #1   Title pt will use compensatory measures for speech intelligibilty of 95%+ in 10 minutes mod complex conversation, using self  correction PRN x3 sessions    Baseline 07-04-20    Time 1    Period Weeks   or 17 total sessions, for all LTGs   Status On-going      SLP LONG TERM GOAL #2   Title pt will use compensations for speech intelligiblity in 19/20 sentence responses resulting in 100% intelligbility in 3 sessions    Baseline 07-04-20    Time 1    Period Weeks    Status On-going      SLP LONG TERM GOAL #3   Title pt will demonstrate appropriate anticipatory awareness in mod complex cognitive linguistic tasks by spontaneously self-correcting or double checking work 100% of the time in 3 sessions    Baseline 07-12-20    Time 1    Period Weeks    Status On-going      SLP LONG TERM GOAL #4   Title pt will have 100% accuracy in simultaneous communicative tasks (simple divided attention) or make compensations for situations where she would have normally performed divided attention, in 3 sessions    Time 1    Period Weeks    Status On-going      SLP LONG TERM GOAL #5   Title in her last 1-2 visits, pt will demonstrate improved QOL score (</= 80) on speech QOL measure    Time 1    Period Weeks    Status On-going            Plan - 07/17/20 1357    Clinical Impression Statement 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 Pt reports how unnatural voice sounds while she's speaking. SLP provided dysarthria HEP to address imprecise articulation and fluency of speech. Repeition and cued slow speech was effective in reducing errors while reading. MIld inattention and decreased error awareness noted on cognitive tasks, despite patient double checking work. Pt would benefit from further skilled ST services to address dysathria and cognition to assist patient with return to PLOF.    Speech Therapy Frequency 2x / week    Duration 8 weeks    Treatment/Interventions Compensatory strategies;Functional tasks;Patient/family education;SLP instruction and feedback     Potential to Achieve Goals Good    Potential Considerations Co-morbidities    SLP Home Exercise Plan dysarthria HEP    Consulted and Agree with Plan of Care Patient           Patient will benefit from skilled therapeutic intervention in order to improve the following deficits and impairments:   Dysarthria and anarthria  Cognitive communication deficit  Problem List Patient Active Problem List   Diagnosis Date Noted  . Acute CVA (cerebrovascular accident) ([facility]) 03/27/2020  . Sleep apnea   . Diabetes mellitus without complication ([facility])   . COVID-19 long hauler   . Obesity   . Acute pulmonary embolism ([facility]) 06/30/2019  . Pulmonary embolism ([facility]) 05/2019  . Hypernatremia   . Asthma, chronic   . DKA (diabetic ketoacidoses) 11/27/2014  . Diabetes mellitus, new onset ([facility])   . Hyperglycemic hyperosmolar nonketotic coma ([facility])   . Essential hypertension     Alinda Deem, MA CCC-SLP 07/17/2020, 2:06 PM  [facility] [address] [place], [name], [address] Phone: [phone]   Fax:  [phone]   Name: Shelly Shields MRN: [id number] Date of Birth: 09-13-1980

## 2020-07-17 NOTE — Patient Instructions (Addendum)
 -  Bring in a video of you speaking (before CVA) to help [place] identify the changes your voice that are concerning you.    SLOW LOUD OVER-ENNUNCIATE PAUSE  Speech Exercises  Say 3 times in a row, 2 times a day  Call the cat "Buttercup" A calendar of [place], [place] Four floors to cover Yellow oil ointment Fellow lovers of felines Catastrophe in [place] Plump plumbers' plums The church's chimes chimed Telling time 'til eleven Five valve levers Keep the gate closed Go see that guy Fat cows give milk Automatic Data Gophers Fat frogs flip freely [employer] into bed Get that game to [employer] Thick thistles stick together Cinnamon aluminum linoleum Black bugs blood Lovely lemon linament Red leather, yellow leather  Big grocery buggy    Purple baby carriage [facility] Proper copper coffee pot Ripe purple cabbage Three free throws [employer] tackled  [employer] dipped the dessert  [employer] Buckle that [employer] of [employer] Shirts shrink, shells shouldn't [place] [employer] Take the tackle box File the flash message Give me five flapjacks Fundamental relatives Dye the pets purple Talking [place] time after time Dark chocolate chunks Political landscape of the kingdom Actuary genius We played yo-yos yesterday

## 2020-07-19 ENCOUNTER — Other Ambulatory Visit: Payer: Self-pay

## 2020-07-19 ENCOUNTER — Ambulatory Visit: Payer: 59

## 2020-07-19 DIAGNOSIS — R471 Dysarthria and anarthria: Secondary | ICD-10-CM

## 2020-07-19 DIAGNOSIS — R41841 Cognitive communication deficit: Secondary | ICD-10-CM

## 2020-07-19 NOTE — Therapy (Signed)
[facility] [address] [place], [name], [address] Phone: [phone]   Fax:  [phone]  Speech Language Pathology Treatment  Patient Details  Name: KYM Shields MRN: [id number] Date of Birth: 10-15-1980 Referring Provider (SLP): (ref) Dr. Arlyn Leak, (Documentation PCP) Salvadore Oxford, MD   Encounter Date: 07/19/2020   End of Session - 07/19/20 1740    Visit Number 16    Number of Visits 17    Date for SLP Re-Evaluation 08/07/20    Authorization - Visit Number 51    Authorization - Number of Visits 40    SLP Start Time 1319    SLP Stop Time  1400    SLP Time Calculation (min) 41 min    Activity Tolerance Patient tolerated treatment well           Past Medical History:  Diagnosis Date  . Asthma   . COVID-19 long hauler   . Diabetes mellitus without complication ([facility])   . Hypertension   . Migraines   . Obesity   . Pulmonary embolism ([facility]) 05/2019   with COVID  . Sleep apnea     Past Surgical History:  Procedure Laterality Date  . BILATERAL CARPAL TUNNEL RELEASE    . TEE WITHOUT CARDIOVERSION N/A 03/28/2020   Procedure: TRANSESOPHAGEAL ECHOCARDIOGRAM (TEE);  Surgeon: Adrian Prows, MD;  Location: [facility];  Service: Cardiovascular;  Laterality: N/A;    There were no vitals filed for this visit.   Subjective Assessment - 07/19/20 1325    Subjective "It went fine." (re: attention to detail work and dysarthria work with other SLP)    Currently in Pain? No/denies                 ADULT SLP TREATMENT - 07/19/20 1330      General Information   Behavior/Cognition Alert;Cooperative;Pleasant mood      Treatment Provided   Treatment provided Cognitive-Linquistic      Cognitive-Linquistic Treatment   Treatment focused on Dysarthria;Patient/family/caregiver education    Skilled Treatment Pt arrived with fast rate of speech which decreased her intelligiblity, not unlike session this SLP  saw pt on 06-26-20 and 07-10-20. SLP targeted pt's copmensatory speech measures in sentence responses with structured tasks - pt demonstrated noted slowed rate 75% of the time with occasional mod A from SLP including modeling. In conversation outdoors pt generated slowed rate ~30% of the time with occasional mod cues from SLP, including modeling. SLP provided pt with some sentence level tasks she could cont to practice at home after d/c. Pt agrees with d/c next week.      Assessment / Recommendations / Plan   Plan Continue with current plan of care      Progression Toward Goals   Progression toward goals Not progressing toward goals (comment)   progress has plateaued at this time           SLP Education - 07/19/20 1740    Education Details d/c next week    Person(s) Educated Patient    Methods Explanation    Comprehension Verbalized understanding            SLP Short Term Goals - 07/17/20 1311      SLP SHORT TERM GOAL #1   Title pt will perform dysarhthria HEP with rare min x2 consecutive sessions    Period --   or 9 total sessions   Status Partially Met      SLP SHORT TERM GOAL #2  Title pt will complete aphasia testing PRN and goals added PRN    Status Deferred      SLP SHORT TERM GOAL #3   Title pt will undergo cognitive communication testing and goals added PRN    Status Achieved      SLP SHORT TERM GOAL #4   Title pt will use compensatory measures for speech intelligibilty of 95%+ in 10 minutes simple conversation x3 sessions    Baseline 07-04-20    Status Partially Met      SLP SHORT TERM GOAL #5   Title pt will demo awareness of errors, given self correction, in simple cognitive linguistic tasks in 3 sessions    Status Partially Met      SLP SHORT TERM GOAL #6   Title pt will demonstrate appropriate compensatory measures for decr'd attention skills in simple cognitve linguistic tasks    Time 1    Period Weeks    Status Deferred            SLP Long Term  Goals - 07/19/20 1742      SLP LONG TERM GOAL #1   Title pt will use compensatory measures for speech intelligibilty of 95%+ in 10 minutes mod complex conversation, using self correction PRN x3 sessions    Baseline 07-04-20    Time 1    Period Weeks   or 17 total sessions, for all LTGs   Status On-going      SLP LONG TERM GOAL #2   Title pt will use compensations for speech intelligiblity in 19/20 sentence responses resulting in 100% intelligbility in 3 sessions    Baseline 07-04-20    Time 1    Period Weeks    Status On-going      SLP LONG TERM GOAL #3   Title pt will demonstrate appropriate anticipatory awareness in mod complex cognitive linguistic tasks by spontaneously self-correcting or double checking work 100% of the time in 3 sessions    Baseline 07-12-20    Time 1    Period Weeks    Status On-going      SLP LONG TERM GOAL #4   Title pt will have 100% accuracy in simultaneous communicative tasks (simple divided attention) or make compensations for situations where she would have normally performed divided attention, in 3 sessions    Time 1    Period Weeks    Status On-going      SLP LONG TERM GOAL #5   Title in her last 1-2 visits, pt will demonstrate improved QOL score (</= 80) on speech QOL measure    Time 1    Period Weeks    Status On-going            Plan - 07/19/20 1741    Clinical Impression Statement 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 SLP believes pt will meet her rehab potential at this time. Will d/c in next 1-2 visits - pt agrees.  MIld inattention and decreased error awareness noted on cognitive tasks, despite patient double checking work - this makes monitoring her speech more challenging. Pt would benefit from 1-2 more sessions of skilled ST services to address dysathria and cognition to assist patient with return to PLOF.    Speech Therapy Frequency 2x / week    Duration 8 weeks     Treatment/Interventions Compensatory strategies;Functional tasks;Patient/family education;SLP instruction and feedback    Potential to Achieve Goals Good  Potential Considerations Co-morbidities    SLP Home Exercise Plan dysarthria HEP    Consulted and Agree with Plan of Care Patient           Patient will benefit from skilled therapeutic intervention in order to improve the following deficits and impairments:   Dysarthria and anarthria  Cognitive communication deficit    Problem List Patient Active Problem List   Diagnosis Date Noted  . Acute CVA (cerebrovascular accident) ([facility]) 03/27/2020  . Sleep apnea   . Diabetes mellitus without complication ([facility])   . COVID-19 long hauler   . Obesity   . Acute pulmonary embolism ([facility]) 06/30/2019  . Pulmonary embolism ([facility]) 05/2019  . Hypernatremia   . Asthma, chronic   . DKA (diabetic ketoacidoses) 11/27/2014  . Diabetes mellitus, new onset ([facility])   . Hyperglycemic hyperosmolar nonketotic coma ([facility])   . Essential hypertension     [place] ,[place], [place]  07/19/2020, 5:43 PM  [facility] [address] [place], [name], [address] Phone: [phone]   Fax:  [phone]   Name: BRYAN OMURA MRN: [id number] Date of Birth: 08-02-1980

## 2020-07-21 ENCOUNTER — Telehealth: Payer: Self-pay

## 2020-07-21 NOTE — Progress Notes (Signed)
Please inform patient her monitor did not show atrial fibrillation.  She did have 2 episodes of fast heart rate.  We will discuss further at upcoming office visit.

## 2020-07-21 NOTE — Telephone Encounter (Signed)
-----   Message from Rayford Halsted, [place] sent at 07/21/2020  4:31 PM EST ----- Please inform patient her monitor did not show atrial fibrillation.  She did have 2 episodes of fast heart rate.  We will discuss further at upcoming office visit.

## 2020-07-24 ENCOUNTER — Other Ambulatory Visit: Payer: Self-pay

## 2020-07-24 ENCOUNTER — Ambulatory Visit: Payer: 59

## 2020-07-24 DIAGNOSIS — R41841 Cognitive communication deficit: Secondary | ICD-10-CM

## 2020-07-24 DIAGNOSIS — R471 Dysarthria and anarthria: Secondary | ICD-10-CM | POA: Diagnosis not present

## 2020-07-24 NOTE — Therapy (Signed)
[facility] [address] [place], [name], [address] Phone: [phone]   Fax:  [phone]  Speech Language Pathology Treatment/Discharge Summary  Patient Details  Name: Shelly Shields MRN: [id number] Date of Birth: Jul 16, 1980 Referring Provider (SLP): (ref) Dr. Arlyn Leak, (Documentation PCP) Salvadore Oxford, MD   Encounter Date: 07/24/2020   End of Session - 07/24/20 1717    Visit Number 17    Number of Visits 17    Date for SLP Re-Evaluation 08/07/20    Authorization - Visit Number 60    Authorization - Number of Visits 7    SLP Start Time 1319    SLP Stop Time  1358    SLP Time Calculation (min) 39 min    Activity Tolerance Patient tolerated treatment well           Past Medical History:  Diagnosis Date  . Asthma   . COVID-19 long hauler   . Diabetes mellitus without complication ([facility])   . Hypertension   . Migraines   . Obesity   . Pulmonary embolism ([facility]) 05/2019   with COVID  . Sleep apnea     Past Surgical History:  Procedure Laterality Date  . BILATERAL CARPAL TUNNEL RELEASE    . TEE WITHOUT CARDIOVERSION N/A 03/28/2020   Procedure: TRANSESOPHAGEAL ECHOCARDIOGRAM (TEE);  Surgeon: Adrian Prows, MD;  Location: [facility];  Service: Cardiovascular;  Laterality: N/A;    There were no vitals filed for this visit.   SPEECH THERAPY DISCHARGE SUMMARY  Visits from Start of Care: 17  Current functional level related to goals / functional outcomes: See below   Remaining deficits: Mild dysarthria, mild cognitive deificts   Education / Equipment: Compensations for speech, cognition.   Plan: Patient agrees to discharge.  Patient goals were partially met. Patient is being discharged due to                                                     ?????reached max rehab potential at this time.         Subjective Assessment - 07/24/20 1402    Subjective "We didn't do much- sat in my rome  (room)."    Currently in Pain? No/denies                 ADULT SLP TREATMENT - 07/24/20 1321      General Information   Behavior/Cognition Alert;Cooperative;Pleasant mood      Treatment Provided   Treatment provided Cognitive-Linquistic      Cognitive-Linquistic Treatment   Treatment focused on Dysarthria;Patient/family/caregiver education    Skilled Treatment Shelly Shields entered the room with fast speech rate today which minimally decr'd her intelligibility in that SLP had to take focus off of pt's message and on to her articulation x2, for ~1 second. In structured tasks, pt had best demo of speech compensations with shorter answers. Her rate of compensation use decr'd as linguistic complexity incr'd - 75-80% use of compensations. In conversation outdoors for 10 mintues pt used slower rate and overarticulation for clearer speech 30-40% of the time. SLP encouraged Shelly Shields to cont to perfrom the structured speech tasks provided last session and work from long phrase answers to sentence and then 2-sentence answers - SLP told pt if she records her answers it will enhance her pratice experience, and that pt's  speech clarity may be more or less her new normal at this time. She demonstrated understanding of this. Pt states she double checked the oil in her car and then double-checked. She also reports doing the homework and double checking. Pt explained this with fast rate of speech which decr'd inteligibilty.      Assessment / Recommendations / Plan   Plan Continue with current plan of care      Progression Toward Goals   Progression toward goals Not progressing toward goals (comment)   plateau of progress           SLP Education - 07/24/20 1717    Education Details how to cont with ST practice    Person(s) Educated Patient    Methods Explanation    Comprehension Verbalized understanding            SLP Short Term Goals - 07/17/20 1311      SLP SHORT TERM GOAL #1   Title pt will  perform dysarhthria HEP with rare min x2 consecutive sessions    Period --   or 9 total sessions   Status Partially Met      SLP SHORT TERM GOAL #2   Title pt will complete aphasia testing PRN and goals added PRN    Status Deferred      SLP SHORT TERM GOAL #3   Title pt will undergo cognitive communication testing and goals added PRN    Status Achieved      SLP SHORT TERM GOAL #4   Title pt will use compensatory measures for speech intelligibilty of 95%+ in 10 minutes simple conversation x3 sessions    Baseline 07-04-20    Status Partially Met      SLP SHORT TERM GOAL #5   Title pt will demo awareness of errors, given self correction, in simple cognitive linguistic tasks in 3 sessions    Status Partially Met      SLP SHORT TERM GOAL #6   Title pt will demonstrate appropriate compensatory measures for decr'd attention skills in simple cognitve linguistic tasks    Time 1    Period Weeks    Status Deferred            SLP Long Term Goals - 07/24/20 1718      SLP LONG TERM GOAL #1   Title pt will use compensatory measures for speech intelligibilty of 95%+ in 10 minutes mod complex conversation, using self correction PRN x3 sessions    Baseline 07-04-20, 07-24-20    Period --   or 17 total sessions, for all LTGs   Status Partially Met      SLP LONG TERM GOAL #2   Title pt will use compensations for speech intelligiblity in 19/20 sentence responses resulting in 100% intelligbility in 3 sessions    Baseline 07-04-20    Status Partially Met      SLP LONG TERM GOAL #3   Title pt will demonstrate appropriate anticipatory awareness in mod complex cognitive linguistic tasks by spontaneously self-correcting or double checking work 100% of the time in 3 sessions    Baseline 07-12-20    Status Partially Met      SLP LONG TERM GOAL #4   Title pt will have 100% accuracy in simultaneous communicative tasks (simple divided attention) or make compensations for situations where she would have  normally performed divided attention, in 3 sessions    Status Not Met      SLP LONG TERM GOAL #5  Title in her last 1-2 visits, pt will demonstrate improved QOL score (</= 80) on speech QOL measure    Status Deferred   QOL not provided to pt in last session           Plan - 07/24/20 1718    Clinical Impression Statement 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 SLP believes pt has met her rehab potential at this time. MIld inattention and decreased error awareness noted on cognitive tasks, despite patient double checking work - this makes monitoring her speech more challenging. Pt agrees with d/c today.    Treatment/Interventions Compensatory strategies;Functional tasks;Patient/family education;SLP instruction and feedback    Potential to Achieve Goals Good    Potential Considerations Co-morbidities    SLP Home Exercise Plan dysarthria HEP    Consulted and Agree with Plan of Care Patient           Patient will benefit from skilled therapeutic intervention in order to improve the following deficits and impairments:   Dysarthria and anarthria  Cognitive communication deficit    Problem List Patient Active Problem List   Diagnosis Date Noted  . Acute CVA (cerebrovascular accident) ([facility]) 03/27/2020  . Sleep apnea   . Diabetes mellitus without complication ([facility])   . COVID-19 long hauler   . Obesity   . Acute pulmonary embolism ([facility]) 06/30/2019  . Pulmonary embolism ([facility]) 05/2019  . Hypernatremia   . Asthma, chronic   . DKA (diabetic ketoacidoses) 11/27/2014  . Diabetes mellitus, new onset ([facility])   . Hyperglycemic hyperosmolar nonketotic coma ([facility])   . Essential hypertension     [place] ,[place], [place]  07/24/2020, 5:20 PM  [facility] [address] [place], [name], [address] Phone: [phone]   Fax:  [phone]   Name: Shelly Shields MRN: [id number] Date of Birth: 1980/11/19

## 2020-07-25 ENCOUNTER — Ambulatory Visit: Payer: 59

## 2020-07-25 DIAGNOSIS — I420 Dilated cardiomyopathy: Secondary | ICD-10-CM

## 2020-07-25 DIAGNOSIS — I5022 Chronic systolic (congestive) heart failure: Secondary | ICD-10-CM

## 2020-07-26 ENCOUNTER — Ambulatory Visit: Payer: 59

## 2020-08-02 NOTE — Progress Notes (Signed)
Primary Physician/Referring:  [name], MD  Patient ID: Shelly Shields, female    DOB: June 26, 1980, 40 y.o.   MRN: [id number]  Chief Complaint  Patient presents with  . Congestive Heart Failure  . Follow-up    6 weeks   HPI:    Shelly Shields  is a 40 y.o. African-American female with hypertension, hyperlipidemia, uncontrolled diabetes mellitus, obesity and obstructive sleep apnea on CPAP and history of Covid pneumonia in January 2021 and also had pulmonary embolism during Covid infection.  She is also being evaluated for vasculitis. 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 Cardiomyopathy etiology likely stress cardiomyopathy vs significant coronary artery disease vs small PFO noted on TEE vs atrial fibrillation.   Patient presents for 6-week follow-up of heart failure.  At last office visit added Corlanor 2.5 mg twice daily and increased Entresto to 97/103 mg daily.  Patient also underwent 2-week cardiac monitoring, which revealed no episodes of atrial fibrillation. Denies chest pain, palpitations, orthopnea, PND, dizziness. She continues to have mild dyspnea on exertion and intermittent leg swelling when on her feet for long periods, improves when elevating her legs. She continues to monitor her heart rate at home, presently averaging in the 80s bpm since starting Corlanor.   Past Medical History:  Diagnosis Date  . Asthma   . COVID-19 long hauler   . Diabetes mellitus without complication ([place])   . Hypertension   . Migraines   . Obesity   . Pulmonary embolism ([place]) 05/2019   with COVID  . Sleep apnea    Past Surgical History:  Procedure Laterality Date  . BILATERAL CARPAL TUNNEL RELEASE    . TEE WITHOUT CARDIOVERSION N/A 03/28/2020   Procedure: TRANSESOPHAGEAL ECHOCARDIOGRAM (TEE);  Surgeon: Adrian Prows, MD;  Location: [facility] ENDOSCOPY;  Service: Cardiovascular;  Laterality:  N/A;   Family History  Problem Relation Age of Onset  . Hypertension Mother   . Thyroid disease Mother   . Hypertension Father   . Stroke Father 21  . Diabetes Father   . Sleep apnea Father   . CVA Paternal Uncle 89  . Heart failure Paternal Grandfather     Social History   Tobacco Use  . Smoking status: Never Smoker  . Smokeless tobacco: Never Used  Substance Use Topics  . Alcohol use: Yes    Alcohol/week: 0.0 standard drinks    Comment: occasional    Marital Status: Single   ROS  Review of Systems  Constitutional: Negative for malaise/fatigue and weight gain.  Cardiovascular: Positive for dyspnea on exertion and leg swelling (intermittent ). Negative for chest pain, claudication, near-syncope, orthopnea, palpitations, paroxysmal nocturnal dyspnea and syncope.  Respiratory: Negative for shortness of breath.   Hematologic/Lymphatic: Does not bruise/bleed easily.  Gastrointestinal: Negative for melena.  Neurological: Negative for dizziness and weakness.    Objective  Blood pressure 134/90, pulse 98, temperature 98 F (36.7 C), temperature source Temporal, resp. rate 16, height $RemoveBe'5\' 1"'aVmYYwwEK$  (1.549 m), weight 235 lb (106.6 kg), SpO2 98 %.  Vitals with BMI 08/03/2020 06/22/2020 06/02/2020  Height $Remov'5\' 1"'AFyAao$  $Remove'5\' 1"'AkPZBXG$  -  Weight 235 lbs 228 lbs -  BMI 85.02 77.4 -  Systolic 128 786 767  Diastolic 90 82 82  Pulse 98 98 91      Physical Exam Vitals reviewed.  Constitutional:      Appearance: She is obese.  HENT:     Head: Normocephalic  and atraumatic.  Cardiovascular:     Rate and Rhythm: Normal rate and regular rhythm.     Pulses: Intact distal pulses.     Heart sounds: S1 normal and S2 normal. No murmur heard. No gallop.      Comments: No leg edema.  No JVD.  Pulmonary:     Effort: Pulmonary effort is normal. No respiratory distress.     Breath sounds: No wheezing, rhonchi or rales.  Musculoskeletal:     Right lower leg: Edema (trace) present.     Left lower leg: Edema (trace)  present.  Skin:    General: Skin is warm and dry.  Neurological:     Mental Status: She is alert.     Cranial Nerves: No cranial nerve deficit.     Gait: Gait abnormal (imprved, occasionally needs cane).     Comments: Speech has significantly improved      Laboratory examination:   Recent Labs    05/22/20 1624 06/07/20 1545 07/07/20 1559  NA 141 143 142  K 3.6 4.0 3.9  CL 111* 110* 113*  CO2 17* 21 16*  GLUCOSE 251* 116* 116*  BUN $Re'11 12 10  'amM$ CREATININE 0.95 0.96 0.95  CALCIUM 8.8 9.2 9.0  GFRNONAA 75 74 75  GFRAA 87 86 87   CrCl cannot be calculated (Patient's most recent lab result is older than the maximum 21 days allowed.).  CMP Latest Ref Rng & Units 07/07/2020 06/07/2020 05/22/2020  Glucose 65 - 99 mg/dL 116(H) 116(H) 251(H)  BUN 6 - 24 mg/dL $Remove'10 12 11  'LkDphPt$ Creatinine 0.57 - 1.00 mg/dL 0.95 0.96 0.95  Sodium 134 - 144 mmol/L 142 143 141  Potassium 3.5 - 5.2 mmol/L 3.9 4.0 3.6  Chloride 96 - 106 mmol/L 113(H) 110(H) 111(H)  CO2 20 - 29 mmol/L 16(L) 21 17(L)  Calcium 8.7 - 10.2 mg/dL 9.0 9.2 8.8  Total Protein 6.5 - 8.1 g/dL - - -  Total Bilirubin 0.3 - 1.2 mg/dL - - -  Alkaline Phos 38 - 126 U/L - - -  AST 15 - 41 U/L - - -  ALT 0 - 44 U/L - - -   CBC Latest Ref Rng & Units 03/29/2020 03/27/2020 03/27/2020  WBC 4.0 - 10.5 K/uL 10.4 - 11.7(H)  Hemoglobin 12.0 - 15.0 g/dL 12.7 12.9 12.8  Hematocrit 36.0 - 46.0 % 38.3 38.0 39.3  Platelets 150 - 400 K/uL 245 - 195    Lipid Panel Recent Labs    03/28/20 0415  CHOL 176  TRIG 106  LDLCALC 126*  VLDL 21  HDL 29*  CHOLHDL 6.1    HEMOGLOBIN A1C Lab Results  Component Value Date   HGBA1C 11.2 (H) 03/27/2020   MPG 274.74 03/27/2020   TSH Recent Labs    03/28/20 0415  TSH 3.896    External labs:  None   Medications and allergies   Allergies  Allergen Reactions  . Lisinopril Cough  . Metformin Diarrhea  . Ibuprofen Hives  . Ketorolac Hives     Outpatient Medications Prior to Visit  Medication Sig  Dispense Refill  . apixaban (ELIQUIS) 5 MG TABS tablet Take 1 tablet (5 mg total) by mouth 2 (two) times daily. 60 tablet 0  . Baclofen 5 MG TABS TAKE 1 TABLET BY MOUTH THREE TIMES DAILY AS NEEDED FOR HEADACHE OR NECK PAIN    . Blood Glucose Monitoring Suppl (ACCU-CHEK NANO SMARTVIEW) W/DEVICE KIT 1 Device by Does not apply route 4 (four) times daily -  after meals and at bedtime. 1 kit 0  . butalbital-acetaminophen-caffeine (FIORICET) 50-325-40 MG tablet Take 1 tablet by mouth every 6 (six) hours as needed for headache. 20 tablet 0  . carvedilol (COREG) 25 MG tablet Take 1 tablet (25 mg total) by mouth 2 (two) times daily with a meal. 180 tablet 3  . escitalopram (LEXAPRO) 10 MG tablet Take 10 mg by mouth daily.    . fexofenadine (ALLEGRA) 180 MG tablet Take 180 mg by mouth daily as needed.     . fluticasone (FLONASE) 50 MCG/ACT nasal spray Place 2 sprays into both nostrils daily as needed for allergies.   3  . fluticasone (FLOVENT HFA) 110 MCG/ACT inhaler Inhale 1 puff into the lungs daily.     Marland Kitchen glucose blood (ACCU-CHEK SMARTVIEW) test strip Check sugar 6 x daily (Patient taking differently: Check sugar 6 x daily. Uses true test instead) 200 each 3  . insulin glargine (LANTUS) 100 UNIT/ML Solostar Pen Inject 80 Units into the skin 2 (two) times daily.    . insulin lispro (HUMALOG) 100 UNIT/ML injection Inject into the skin. Sliding scale    . Insulin Pen Needle (INSUPEN PEN NEEDLES) 32G X 4 MM MISC BD Pen Needles- brand specific. Inject insulin via insulin pen daily 200 each 3  . medroxyPROGESTERone (DEPO-PROVERA) 150 MG/ML injection Inject 150 mg into the muscle every 3 (three) months.    . methocarbamol (ROBAXIN) 500 MG tablet Take 1 tablet (500 mg total) by mouth every 8 (eight) hours as needed for muscle spasms. 30 tablet 0  . montelukast (SINGULAIR) 10 MG tablet Take 10 mg by mouth at bedtime.    . nortriptyline (PAMELOR) 10 MG capsule Take 10 mg by mouth at bedtime.    . ondansetron  (ZOFRAN) 4 MG tablet Take 1 tablet (4 mg total) by mouth every 8 (eight) hours as needed for nausea or vomiting. 20 tablet 0  . pantoprazole (PROTONIX) 40 MG tablet Take 40 mg by mouth daily.    . rosuvastatin (CRESTOR) 20 MG tablet Take 1 tablet (20 mg total) by mouth daily at 6 PM. 30 tablet 0  . sacubitril-valsartan (ENTRESTO) 97-103 MG Take 1 tablet by mouth 2 (two) times daily. 180 tablet 3  . topiramate (TOPAMAX) 100 MG tablet Take 200 mg by mouth at bedtime.     . TRUEPLUS LANCETS 33G MISC USE TO CHECK BLOOD GLUCOSE QID - AFTER MEALS AND AT BEDTIME  3  . TRULICITY 6.64 QI/3.4VQ SOPN Inject 0.75 mg into the skin once a week.    . VENTOLIN HFA 108 (90 BASE) MCG/ACT inhaler Use as directed 2 puffs in the mouth or throat 4 (four) times daily as needed for shortness of breath.   3  . CORLANOR 5 MG TABS tablet TAKE 1/2 TABLET BY MOUTH TWICE DAILY, WITH A MEAL 90 tablet 3  . Lancet Devices (ACCU-CHEK SOFTCLIX) lancets Use as instructed for blood glucose checks four times daily, before meals and at bedtime 100 each 5   No facility-administered medications prior to visit.     Radiology:   No results found. CT Code Stroke CTA Head W/WO contrast 03/27/2020 1. Negative for large vessel occlusion but positive for Right MCA M3 occlusion. CT Perfusion detects core infarct with minimally larger penumbra at the right insula/operculum corresponding to the plain CT finding.  2. Additionally, mild vessel irregularity is noted in multiple other circle-of-Willis branches, including the left A1, right P2, left M2. Although nonspecific this constellation of clinical and  imaging findings might indicate accelerated branch vessel atherosclerosis. Although other large vessels appear normal, with no atherosclerosis in the neck or at the aortic arch.  MR ANGIO HEAD WO CONTRAST 03/27/2020 1. Moderate-sized acute right MCA infarct. 2. Small subacute left parieto-occipital infarcts. 3. Right M3 branch occlusion,  otherwise negative head [id number]  CT ABDOMEN PELVIS W CONTRAST 03/27/2020 1. New 18 mm hypoattenuating lesion upper pole right kidney with areas of subcapsular multifocal decreased enhancement in the lower pole right kidney. Imaging features are nonspecific and could be related to pyelonephritis and phlegmon/evolving abscess in the upper pole right kidney cannot be excluded. Alternatively, multifocal right renal infarct could have this appearance. Correlation with urinalysis may prove helpful.  2. Relatively well-defined 2.2 cm lesion in the inferior lingula measures water density. This may be loculated pleural fluid or intraparenchymal fluid collection in this patient with a history of pulmonary embolus and bilateral ground-glass airspace disease on the previous study suspicious for multifocal atypical/viral pneumonia. Consider follow-up to ensure resolution.  Cardiac Studies:   PCV ECHOCARDIOGRAM COMPLETE 07/25/2020 Severely depressed LV systolic function with visual EF 25-30%. Hypokinetic global wall motion. Left ventricle cavity is dilated. Mild left ventricular hypertrophy. Doppler evidence of grade I (impaired) diastolic dysfunction, elevated LAP. Mild (Grade I) mitral regurgitation. Prior study dated 03/27/2020: LVEF 20-25%, regional wall motion abnormalities, grade 2 diastolic dysfunction, moderately dilated left atrium, trivial MR. Recommendations: May consider limited echo to reevaluate LVEF with Definity or consider MUGA scan to better evaluate LVEF.   LONG TERM MONITOR (8-14 DAYS) 06/22/20-07/06/20:  Predominant underlying rhythm was sinus with minimum heart rate of 71 bpm, maximum heart rate of 193 bpm average heart rate of 91 bpm.  There were no patient triggered events.  PACs and PVCs were rare.  Patient with intermittent right bundle branch block.  2 episodes of supraventricular tachycardia with the longest lasting 12 seconds and a maximum heart rate of 93 bpm.  No evidence of atrial  fibrillation.  No ventricular tachycardia, high degree AV block, pauses >3 seconds.  ECHOCARDIOGRAM COMPLETE BUBBLE STUDY 03/27/2020 1. Left ventricular ejection fraction, by estimation, is 20 to 25%. The left ventricle has severely decreased function. There is severe hypokinesis of the basal-to-apical anteroseptum, inferoseptum and anterior LV walls . Left ventricular diastolic  parameters are consistent with Grade II diastolic dysfunction (pseudonormalization). 2. Right ventricular systolic function is normal. The right ventricular size is normal. 3. Left atrial size was moderately dilated. 4. The mitral valve is normal in structure. Trivial mitral valve regurgitation. 5. The aortic valve is tricuspid. There is mild thickening of the aortic valve. Aortic valve regurgitation is not visualized. 6. The inferior vena cava is normal in size with greater than 50% respiratory variability, suggesting right atrial pressure of 3 mmHg. 7. Agitated saline contrast bubble study was negative, with no evidence of any interatrial shunt.  Comparison(s): Compared to prior echo in 06/2019, the LVEF is now severely reduced to 20-25% with severe hypokinesis of the inferoseptal, anteroseptal and anterior LV walls.  EKG:   EKG 04/18/2020: Sinus rhythm at a rate of 90 bpm. Left axis deviation. PRWP, cannot exclude anteroseptal infarct old. Non-specific T wave abnormality.   EKG 03/27/2020: Sinus tachycardia at rate of 102 bpm, normal axis, borderline criteria for LVH. Borderline QT elongation. Compared to 06/30/2019, sinus tachycardia new. No change in QT elongation.  Assessment     ICD-10-CM   1. Dilated cardiomyopathy (HCC)  I42.0 PCV MYOCARDIAL PERFUSION WITH LEXISCAN    ivabradine (CORLANOR) 5  MG TABS tablet  2. Chronic systolic heart failure (HCC)  I50.22 PCV MYOCARDIAL PERFUSION WITH LEXISCAN    ivabradine (CORLANOR) 5 MG TABS tablet  3. History of CVA with residual deficit  I69.30       Medications Discontinued During This Encounter  Medication Reason  . CORLANOR 5 MG TABS tablet     Meds ordered this encounter  Medications  . ivabradine (CORLANOR) 5 MG TABS tablet    Sig: Take 1 tablet (5 mg total) by mouth 2 (two) times daily with a meal.    Dispense:  90 tablet    Refill:  3    Recommendations:   Jania B Hoffert is a 40 y.o. African-American female with hypertension, hyperlipidemia, uncontrolled diabetes mellitus, obesity and obstructive sleep apnea on CPAP and history of Covid pneumonia in January 2021 and also had pulmonary embolism during Covid infection.  She is also being evaluated for vasculitis. 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 Cardiomyopathy etiology likely stress cardiomyopathy vs significant coronary artery disease vs small PFO noted on TEE vs atrial fibrillation.   Patient presents for 6-week follow-up of heart failure.  Reviewed and discussed with patient results of cardiac monitor as well as repeat echocardiogram, details above.  Cardiac monitor revealed no episodes of atrial fibrillation, therefore it is not likely to have been cause of patient's recent stroke. Echocardiogram with LVEF 25-30% and hypokinetic global wall motion with dilated LV. Patient is stable at this time will therefore proceed with nuclear stress test for evaluation of ischemic underlying etiology of cardiomyopathy. Due to her abnormal gait and use of a cane still, will do Lexiscan nuclear stress test.   Also as patient's heart rate remains >70 bpm will increase Corlanor to 5 mg twice daily.   Discussed at length with patient regarding the importance of diet compliance and weight loss. Provided nutritional guidance and answered all patient's and her mother's questions to their satisfaction.   Follow up in 3 months, sooner if needed, for heart failure and medication management.     This was a 35-minute encounter with face-to-face counseling, medical records review, coordination of care, explanation of complex medical issues, complex medical decision making.    Alethia Berthold, PA-C 08/04/2020, 7:40 AM Office: [phone]

## 2020-08-03 ENCOUNTER — Encounter: Payer: Self-pay | Admitting: Student

## 2020-08-03 ENCOUNTER — Ambulatory Visit: Payer: 59 | Admitting: Student

## 2020-08-03 ENCOUNTER — Other Ambulatory Visit: Payer: Self-pay

## 2020-08-03 VITALS — BP 134/90 | HR 98 | Temp 98.0°F | Resp 16 | Ht 61.0 in | Wt 235.0 lb

## 2020-08-03 DIAGNOSIS — I5022 Chronic systolic (congestive) heart failure: Secondary | ICD-10-CM

## 2020-08-03 DIAGNOSIS — I6930 Unspecified sequelae of cerebral infarction: Secondary | ICD-10-CM

## 2020-08-03 DIAGNOSIS — I420 Dilated cardiomyopathy: Secondary | ICD-10-CM

## 2020-08-03 MED ORDER — IVABRADINE HCL 5 MG PO TABS: 5.0000 mg | ORAL_TABLET | Freq: Two times a day (BID) | ORAL | 3 refills | Status: DC

## 2020-08-03 NOTE — Patient Instructions (Signed)
Calorie Counting for Weight Loss Calories are units of energy. Your body needs a certain number of calories from food to keep going throughout the day. When you eat or drink more calories than your body needs, your body stores the extra calories mostly as fat. When you eat or drink fewer calories than your body needs, your body burns fat to get the energy it needs. Calorie counting means keeping track of how many calories you eat and drink each day. Calorie counting can be helpful if you need to lose weight. If you eat fewer calories than your body needs, you should lose weight. Ask your health care provider what a healthy weight is for you. For calorie counting to work, you will need to eat the right number of calories each day to lose a healthy amount of weight per week. A dietitian can help you figure out how many calories you need in a day and will suggest ways to reach your calorie goal.  A healthy amount of weight to lose each week is usually 1-2 lb (0.5-0.9 kg). This usually means that your daily calorie intake should be reduced by 500-750 calories.  Eating 1,200-1,500 calories a day can help most women lose weight.  Eating 1,500-1,800 calories a day can help most men lose weight. What do I need to know about calorie counting? Work with your health care provider or dietitian to determine how many calories you should get each day. To meet your daily calorie goal, you will need to:  Find out how many calories are in each food that you would like to eat. Try to do this before you eat.  Decide how much of the food you plan to eat.  Keep a food log. Do this by writing down what you ate and how many calories it had. To successfully lose weight, it is important to balance calorie counting with a healthy lifestyle that includes regular activity. Where do I find calorie information? The number of calories in a food can be found on a Nutrition Facts label. If a food does not have a Nutrition Facts  label, try to look up the calories online or ask your dietitian for help. Remember that calories are listed per serving. If you choose to have more than one serving of a food, you will have to multiply the calories per serving by the number of servings you plan to eat. For example, the label on a package of bread might say that a serving size is 1 slice and that there are 90 calories in a serving. If you eat 1 slice, you will have eaten 90 calories. If you eat 2 slices, you will have eaten 180 calories.   How do I keep a food log? After each time that you eat, record the following in your food log as soon as possible:  What you ate. Be sure to include toppings, sauces, and other extras on the food.  How much you ate. This can be measured in cups, ounces, or number of items.  How many calories were in each food and drink.  The total number of calories in the food you ate. Keep your food log near you, such as in a pocket-sized notebook or on an app or website on your mobile phone. Some programs will calculate calories for you and show you how many calories you have left to meet your daily goal. What are some portion-control tips?  Know how many calories are in a serving. This will   help you know how many servings you can have of a certain food.  Use a measuring cup to measure serving sizes. You could also try weighing out portions on a kitchen scale. With time, you will be able to estimate serving sizes for some foods.  Take time to put servings of different foods on your favorite plates or in your favorite bowls and cups so you know what a serving looks like.  Try not to eat straight from a food's packaging, such as from a bag or box. Eating straight from the package makes it hard to see how much you are eating and can lead to overeating. Put the amount you would like to eat in a cup or on a plate to make sure you are eating the right portion.  Use smaller plates, glasses, and bowls for smaller  portions and to prevent overeating.  Try not to multitask. For example, avoid watching TV or using your computer while eating. If it is time to eat, sit down at a table and enjoy your food. This will help you recognize when you are full. It will also help you be more mindful of what and how much you are eating. What are tips for following this plan? Reading food labels  Check the calorie count compared with the serving size. The serving size may be smaller than what you are used to eating.  Check the source of the calories. Try to choose foods that are high in protein, fiber, and vitamins, and low in saturated fat, trans fat, and sodium. Shopping  Read nutrition labels while you shop. This will help you make healthy decisions about which foods to buy.  Pay attention to nutrition labels for low-fat or fat-free foods. These foods sometimes have the same number of calories or more calories than the full-fat versions. They also often have added sugar, starch, or salt to make up for flavor that was removed with the fat.  Make a grocery list of lower-calorie foods and stick to it. Cooking  Try to cook your favorite foods in a healthier way. For example, try baking instead of frying.  Use low-fat dairy products. Meal planning  Use more fruits and vegetables. One-half of your plate should be fruits and vegetables.  Include lean proteins, such as chicken, turkey, and fish. Lifestyle Each week, aim to do one of the following:  150 minutes of moderate exercise, such as walking.  75 minutes of vigorous exercise, such as running. General information  Know how many calories are in the foods you eat most often. This will help you calculate calorie counts faster.  Find a way of tracking calories that works for you. Get creative. Try different apps or programs if writing down calories does not work for you. What foods should I eat?  Eat nutritious foods. It is better to have a nutritious,  high-calorie food, such as an avocado, than a food with few nutrients, such as a bag of potato chips.  Use your calories on foods and drinks that will fill you up and will not leave you hungry soon after eating. ? Examples of foods that fill you up are nuts and nut butters, vegetables, lean proteins, and high-fiber foods such as whole grains. High-fiber foods are foods with more than 5 g of fiber per serving.  Pay attention to calories in drinks. Low-calorie drinks include water and unsweetened drinks. The items listed above may not be a complete list of foods and beverages you can eat.   Contact a dietitian for more information.   What foods should I limit? Limit foods or drinks that are not good sources of vitamins, minerals, or protein or that are high in unhealthy fats. These include:  Candy.  Other sweets.  Sodas, specialty coffee drinks, alcohol, and juice. The items listed above may not be a complete list of foods and beverages you should avoid. Contact a dietitian for more information. How do I count calories when eating out?  Pay attention to portions. Often, portions are much larger when eating out. Try these tips to keep portions smaller: ? Consider sharing a meal instead of getting your own. ? If you get your own meal, eat only half of it. Before you start eating, ask for a container and put half of your meal into it. ? When available, consider ordering smaller portions from the menu instead of full portions.  Pay attention to your food and drink choices. Knowing the way food is cooked and what is included with the meal can help you eat fewer calories. ? If calories are listed on the menu, choose the lower-calorie options. ? Choose dishes that include vegetables, fruits, whole grains, low-fat dairy products, and lean proteins. ? Choose items that are boiled, broiled, grilled, or steamed. Avoid items that are buttered, battered, fried, or served with cream sauce. Items labeled as  crispy are usually fried, unless stated otherwise. ? Choose water, low-fat milk, unsweetened iced tea, or other drinks without added sugar. If you want an alcoholic beverage, choose a lower-calorie option, such as a glass of wine or light beer. ? Ask for dressings, sauces, and syrups on the side. These are usually high in calories, so you should limit the amount you eat. ? If you want a salad, choose a garden salad and ask for grilled meats. Avoid extra toppings such as bacon, cheese, or fried items. Ask for the dressing on the side, or ask for olive oil and vinegar or lemon to use as dressing.  Estimate how many servings of a food you are given. Knowing serving sizes will help you be aware of how much food you are eating at restaurants. Where to find more information  Centers for Disease Control and Prevention: http://www.wolf.info/  [place] Department of Agriculture: http://www.wilson-mendoza.org/ Summary  Calorie counting means keeping track of how many calories you eat and drink each day. If you eat fewer calories than your body needs, you should lose weight.  A healthy amount of weight to lose per week is usually 1-2 lb (0.5-0.9 kg). This usually means reducing your daily calorie intake by 500-750 calories.  The number of calories in a food can be found on a Nutrition Facts label. If a food does not have a Nutrition Facts label, try to look up the calories online or ask your dietitian for help.  Use smaller plates, glasses, and bowls for smaller portions and to prevent overeating.  Use your calories on foods and drinks that will fill you up and not leave you hungry shortly after a meal. This information is not intended to replace advice given to you by your health care provider. Make sure you discuss any questions you have with your health care provider. Document Revised: 06/24/2019 Document Reviewed: 06/24/2019 Elsevier Patient Education  2021 [place]. [email or web address]">  DASH Eating Plan DASH stands for Dietary Approaches to Stop Hypertension. The DASH eating plan is a healthy eating plan that has been shown to:  Reduce high blood pressure (hypertension).  Reduce your  risk for type 2 diabetes, heart disease, and stroke.  Help with weight loss. What are tips for following this plan? Reading food labels  Check food labels for the amount of salt (sodium) per serving. Choose foods with less than 5 percent of the Daily Value of sodium. Generally, foods with less than 300 milligrams (mg) of sodium per serving fit into this eating plan.  To find whole grains, look for the word "whole" as the first word in the ingredient list. Shopping  Buy products labeled as "low-sodium" or "no salt added."  Buy fresh foods. Avoid canned foods and pre-made or frozen meals. Cooking  Avoid adding salt when cooking. Use salt-free seasonings or herbs instead of table salt or sea salt. Check with your health care provider or pharmacist before using salt substitutes.  Do not fry foods. Cook foods using healthy methods such as baking, boiling, grilling, roasting, and broiling instead.  Cook with heart-healthy oils, such as olive, canola, avocado, soybean, or sunflower oil. Meal planning  Eat a balanced diet that includes: ? 4 or more servings of fruits and 4 or more servings of vegetables each day. Try to fill one-half of your plate with fruits and vegetables. ? 6-8 servings of whole grains each day. ? Less than 6 oz (170 g) of lean meat, poultry, or fish each day. A 3-oz (85-g) serving of meat is about the same size as a deck of cards. One egg equals 1 oz (28 g). ? 2-3 servings of low-fat dairy each day. One serving is 1 cup (237 mL). ? 1 serving of nuts, seeds, or beans 5 times each week. ? 2-3 servings of heart-healthy fats. Healthy fats called omega-3 fatty acids are found in foods such as walnuts,  flaxseeds, fortified milks, and eggs. These fats are also found in cold-water fish, such as sardines, salmon, and mackerel.  Limit how much you eat of: ? Canned or prepackaged foods. ? Food that is high in trans fat, such as some fried foods. ? Food that is high in saturated fat, such as fatty meat. ? Desserts and other sweets, sugary drinks, and other foods with added sugar. ? Full-fat dairy products.  Do not salt foods before eating.  Do not eat more than 4 egg yolks a week.  Try to eat at least 2 vegetarian meals a week.  Eat more home-cooked food and less restaurant, buffet, and fast food.   Lifestyle  When eating at a restaurant, ask that your food be prepared with less salt or no salt, if possible.  If you drink alcohol: ? Limit how much you use to:  0-1 drink a day for women who are not pregnant.  0-2 drinks a day for men. ? Be aware of how much alcohol is in your drink. In the [place] one drink equals one 12 oz bottle of beer (355 mL), one 5 oz glass of wine (148 mL), or one 1 oz glass of hard liquor (44 mL). General information  Avoid eating more than 2,300 mg of salt a day. If you have hypertension, you may need to reduce your sodium intake to 1,500 mg a day.  Work with your health care provider to maintain a healthy body weight or to lose weight. Ask what an ideal weight is for you.  Get at least 30 minutes of exercise that causes your heart to beat faster (aerobic exercise) most days of the week. Activities may include walking, swimming, or biking.  Work with your health care  provider or dietitian to adjust your eating plan to your individual calorie needs. What foods should I eat? Fruits All fresh, dried, or frozen fruit. Canned fruit in natural juice (without added sugar). Vegetables Fresh or frozen vegetables (raw, steamed, roasted, or grilled). Low-sodium or reduced-sodium tomato and vegetable juice. Low-sodium or reduced-sodium tomato sauce and tomato paste.  Low-sodium or reduced-sodium canned vegetables. Grains Whole-grain or whole-wheat bread. Whole-grain or whole-wheat pasta. Brown rice. [place]. Bulgur. Whole-grain and low-sodium cereals. Pita bread. Low-fat, low-sodium crackers. Whole-wheat flour tortillas. Meats and other proteins Skinless chicken or [place]. Ground chicken or [place]. Pork with fat trimmed off. Fish and seafood. Egg whites. Dried beans, peas, or lentils. Unsalted nuts, nut butters, and seeds. Unsalted canned beans. Lean cuts of beef with fat trimmed off. Low-sodium, lean precooked or cured meat, such as sausages or meat loaves. Dairy Low-fat (1%) or fat-free (skim) milk. Reduced-fat, low-fat, or fat-free cheeses. Nonfat, low-sodium ricotta or cottage cheese. Low-fat or nonfat yogurt. Low-fat, low-sodium cheese. Fats and oils Soft margarine without trans fats. Vegetable oil. Reduced-fat, low-fat, or light mayonnaise and salad dressings (reduced-sodium). Canola, safflower, olive, avocado, soybean, and sunflower oils. Avocado. Seasonings and condiments Herbs. Spices. Seasoning mixes without salt. Other foods Unsalted popcorn and pretzels. Fat-free sweets. The items listed above may not be a complete list of foods and beverages you can eat. Contact a dietitian for more information. What foods should I avoid? Fruits Canned fruit in a light or heavy syrup. Fried fruit. Fruit in cream or butter sauce. Vegetables Creamed or fried vegetables. Vegetables in a cheese sauce. Regular canned vegetables (not low-sodium or reduced-sodium). Regular canned tomato sauce and paste (not low-sodium or reduced-sodium). Regular tomato and vegetable juice (not low-sodium or reduced-sodium). Angie Fava. Olives. Grains Baked goods made with fat, such as croissants, muffins, or some breads. Dry pasta or rice meal packs. Meats and other proteins Fatty cuts of meat. Ribs. Fried meat. Berniece Salines. Bologna, salami, and other precooked or cured meats, such as  sausages or meat loaves. Fat from the back of a pig (fatback). Bratwurst. Salted nuts and seeds. Canned beans with added salt. Canned or smoked fish. Whole eggs or egg yolks. Chicken or [place] with skin. Dairy Whole or 2% milk, cream, and half-and-half. Whole or full-fat cream cheese. Whole-fat or sweetened yogurt. Full-fat cheese. Nondairy creamers. Whipped toppings. Processed cheese and cheese spreads. Fats and oils Butter. Stick margarine. Lard. Shortening. Ghee. Bacon fat. Tropical oils, such as coconut, palm kernel, or palm oil. Seasonings and condiments Onion salt, garlic salt, seasoned salt, table salt, and sea salt. Worcestershire sauce. Tartar sauce. Barbecue sauce. Teriyaki sauce. Soy sauce, including reduced-sodium. Steak sauce. Canned and packaged gravies. Fish sauce. Oyster sauce. Cocktail sauce. Store-bought horseradish. Ketchup. Mustard. Meat flavorings and tenderizers. Bouillon cubes. Hot sauces. Pre-made or packaged marinades. Pre-made or packaged taco seasonings. Relishes. Regular salad dressings. Other foods Salted popcorn and pretzels. The items listed above may not be a complete list of foods and beverages you should avoid. Contact a dietitian for more information. Where to find more information  National Heart, Lung, and Blood Institute: https://wilson-eaton.com/  American Heart Association: www.heart.org  Academy of Nutrition and Dietetics: www.eatright.[place]: www.kidney.org Summary  The DASH eating plan is a healthy eating plan that has been shown to reduce high blood pressure (hypertension). It may also reduce your risk for type 2 diabetes, heart disease, and stroke.  When on the DASH eating plan, aim to eat more fresh fruits and vegetables, whole grains,  lean proteins, low-fat dairy, and heart-healthy fats.  With the DASH eating plan, you should limit salt (sodium) intake to 2,300 mg a day. If you have hypertension, you may need to reduce your  sodium intake to 1,500 mg a day.  Work with your health care provider or dietitian to adjust your eating plan to your individual calorie needs. This information is not intended to replace advice given to you by your health care provider. Make sure you discuss any questions you have with your health care provider. Document Revised: 04/16/2019 Document Reviewed: 04/16/2019 Elsevier Patient Education  2021 Elsevier Inc. Diabetes Mellitus and Nutrition, Adult When you have diabetes, or diabetes mellitus, it is very important to have healthy eating habits because your blood sugar (glucose) levels are greatly affected by what you eat and drink. Eating healthy foods in the right amounts, at about the same times every day, can help you:  Control your blood glucose.  Lower your risk of heart disease.  Improve your blood pressure.  Reach or maintain a healthy weight. What can affect my meal plan? Every person with diabetes is different, and each person has different needs for a meal plan. Your health care provider may recommend that you work with a dietitian to make a meal plan that is best for you. Your meal plan may vary depending on factors such as:  The calories you need.  The medicines you take.  Your weight.  Your blood glucose, blood pressure, and cholesterol levels.  Your activity level.  Other health conditions you have, such as heart or kidney disease. How do carbohydrates affect me? Carbohydrates, also called carbs, affect your blood glucose level more than any other type of food. Eating carbs naturally raises the amount of glucose in your blood. Carb counting is a method for keeping track of how many carbs you eat. Counting carbs is important to keep your blood glucose at a healthy level, especially if you use insulin or take certain oral diabetes medicines. It is important to know how many carbs you can safely have in each meal. This is different for every person. Your dietitian  can help you calculate how many carbs you should have at each meal and for each snack. How does alcohol affect me? Alcohol can cause a sudden decrease in blood glucose (hypoglycemia), especially if you use insulin or take certain oral diabetes medicines. Hypoglycemia can be a life-threatening condition. Symptoms of hypoglycemia, such as sleepiness, dizziness, and confusion, are similar to symptoms of having too much alcohol.  Do not drink alcohol if: ? Your health care provider tells you not to drink. ? You are pregnant, may be pregnant, or are planning to become pregnant.  If you drink alcohol: ? Do not drink on an empty stomach. ? Limit how much you use to:  0-1 drink a day for women.  0-2 drinks a day for men. ? Be aware of how much alcohol is in your drink. In the [place] one drink equals one 12 oz bottle of beer (355 mL), one 5 oz glass of wine (148 mL), or one 1 oz glass of hard liquor (44 mL). ? Keep yourself hydrated with water, diet soda, or unsweetened iced tea.  Keep in mind that regular soda, juice, and other mixers may contain a lot of sugar and must be counted as carbs. What are tips for following this plan? Reading food labels  Start by checking the serving size on the "Nutrition Facts" label of packaged foods and  drinks. The amount of calories, carbs, fats, and other nutrients listed on the label is based on one serving of the item. Many items contain more than one serving per package.  Check the total grams (g) of carbs in one serving. You can calculate the number of servings of carbs in one serving by dividing the total carbs by 15. For example, if a food has 30 g of total carbs per serving, it would be equal to 2 servings of carbs.  Check the number of grams (g) of saturated fats and trans fats in one serving. Choose foods that have a low amount or none of these fats.  Check the number of milligrams (mg) of salt (sodium) in one serving. Most people should limit total  sodium intake to less than 2,300 mg per day.  Always check the nutrition information of foods labeled as "low-fat" or "nonfat." These foods may be higher in added sugar or refined carbs and should be avoided.  Talk to your dietitian to identify your daily goals for nutrients listed on the label. Shopping  Avoid buying canned, pre-made, or processed foods. These foods tend to be high in fat, sodium, and added sugar.  Shop around the outside edge of the grocery store. This is where you will most often find fresh fruits and vegetables, bulk grains, fresh meats, and fresh dairy. Cooking  Use low-heat cooking methods, such as baking, instead of high-heat cooking methods like deep frying.  Cook using healthy oils, such as olive, canola, or sunflower oil.  Avoid cooking with butter, cream, or high-fat meats. Meal planning  Eat meals and snacks regularly, preferably at the same times every day. Avoid going long periods of time without eating.  Eat foods that are high in fiber, such as fresh fruits, vegetables, beans, and whole grains. Talk with your dietitian about how many servings of carbs you can eat at each meal.  Eat 4-6 oz (112-168 g) of lean protein each day, such as lean meat, chicken, fish, eggs, or tofu. One ounce (oz) of lean protein is equal to: ? 1 oz (28 g) of meat, chicken, or fish. ? 1 egg. ?  cup (62 g) of tofu.  Eat some foods each day that contain healthy fats, such as avocado, nuts, seeds, and fish.   What foods should I eat? Fruits Berries. Apples. Oranges. Peaches. Apricots. Plums. Grapes. Mango. Papaya. Pomegranate. Kiwi. Cherries. Vegetables Lettuce. Spinach. Leafy greens, including kale, chard, collard greens, and mustard greens. Beets. Cauliflower. Cabbage. Broccoli. Carrots. Green beans. Tomatoes. Peppers. Onions. Cucumbers. Brussels sprouts. Grains Whole grains, such as whole-wheat or whole-grain bread, crackers, tortillas, cereal, and pasta. Unsweetened  oatmeal. Quinoa. Brown or wild rice. Meats and other proteins Seafood. Poultry without skin. Lean cuts of poultry and beef. Tofu. Nuts. Seeds. Dairy Low-fat or fat-free dairy products such as milk, yogurt, and cheese. The items listed above may not be a complete list of foods and beverages you can eat. Contact a dietitian for more information. What foods should I avoid? Fruits Fruits canned with syrup. Vegetables Canned vegetables. Frozen vegetables with butter or cream sauce. Grains Refined white flour and flour products such as bread, pasta, snack foods, and cereals. Avoid all processed foods. Meats and other proteins Fatty cuts of meat. Poultry with skin. Breaded or fried meats. Processed meat. Avoid saturated fats. Dairy Full-fat yogurt, cheese, or milk. Beverages Sweetened drinks, such as soda or iced tea. The items listed above may not be a complete list of foods and  beverages you should avoid. Contact a dietitian for more information. Questions to ask a health care provider  Do I need to meet with a diabetes educator?  Do I need to meet with a dietitian?  What number can I call if I have questions?  When are the best times to check my blood glucose? Where to find more information:  American Diabetes Association: diabetes.org  Academy of Nutrition and Dietetics: www.eatright.[employer] of Diabetes and Digestive and Kidney Diseases: CarFlippers.tn  Association of Diabetes Care and Education Specialists: www.diabeteseducator.org Summary  It is important to have healthy eating habits because your blood sugar (glucose) levels are greatly affected by what you eat and drink.  A healthy meal plan will help you control your blood glucose and maintain a healthy lifestyle.  Your health care provider may recommend that you work with a dietitian to make a meal plan that is best for you.  Keep in mind that carbohydrates (carbs) and alcohol have immediate effects  on your blood glucose levels. It is important to count carbs and to use alcohol carefully. This information is not intended to replace advice given to you by your health care provider. Make sure you discuss any questions you have with your health care provider. Document Revised: 04/20/2019 Document Reviewed: 04/20/2019 Elsevier Patient Education  2021 [employer].

## 2020-08-07 ENCOUNTER — Other Ambulatory Visit: Payer: 59

## 2020-08-14 ENCOUNTER — Ambulatory Visit: Payer: 59

## 2020-08-14 ENCOUNTER — Other Ambulatory Visit: Payer: Self-pay

## 2020-08-14 DIAGNOSIS — I5022 Chronic systolic (congestive) heart failure: Secondary | ICD-10-CM

## 2020-08-14 DIAGNOSIS — I420 Dilated cardiomyopathy: Secondary | ICD-10-CM

## 2020-08-21 NOTE — Progress Notes (Signed)
Please notify patient her stress test showed some abnormalities which I would like to discuss sooner than 3 month follow up. Please schedule appt with me in the next 2 weeks.

## 2020-08-22 NOTE — Progress Notes (Signed)
Called and spoke with patient regarding her stress tests results and scheduling a follow up appointment.

## 2020-09-05 ENCOUNTER — Other Ambulatory Visit: Payer: Self-pay

## 2020-09-05 ENCOUNTER — Ambulatory Visit: Payer: 59 | Admitting: Student

## 2020-09-05 ENCOUNTER — Encounter: Payer: Self-pay | Admitting: Student

## 2020-09-05 VITALS — BP 134/95 | HR 97 | Temp 98.0°F | Ht 61.0 in | Wt 240.0 lb

## 2020-09-05 DIAGNOSIS — I420 Dilated cardiomyopathy: Secondary | ICD-10-CM

## 2020-09-05 DIAGNOSIS — I5022 Chronic systolic (congestive) heart failure: Secondary | ICD-10-CM

## 2020-09-05 NOTE — Progress Notes (Signed)
Primary Physician/Referring:  [name], MD  Patient ID: Shelly Shields, female    DOB: [birth year], 40 y.o.   MRN: [id number]  Chief Complaint  Patient presents with  . Dilated cardiomyopathy   . Follow-up  . Results   HPI:    Shelly Shields  is a 40 y.o. African-American female with hypertension, hyperlipidemia, uncontrolled diabetes mellitus, obesity and obstructive sleep apnea on CPAP and history of Covid pneumonia in January 2021 and also had pulmonary embolism during Covid infection.  She is also being evaluated for vasculitis. 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 Cardiomyopathy etiology likely stress cardiomyopathy vs significant coronary artery disease vs small PFO noted on TEE vs atrial fibrillation.   Patient presents for follow-up to discuss results of recent nuclear stress test which revealed large medium intensity fixed perfusion defect.  At last visit increase Corlanor to 5 mg twice daily.  Patient feels well without chest pain, palpitations, orthopnea, PND, dizziness.  She does continue to have mild dyspnea on exertion which is slowly improving as she is slowly increasing physical activity.  Average heart rate at home is 70 bpm and blood pressure readings average 120/80 mmHg.   Past Medical History:  Diagnosis Date  . Asthma   . COVID-19 long hauler   . Diabetes mellitus without complication ([place])   . Hypertension   . Migraines   . Obesity   . Pulmonary embolism ([place]) 05/2019   with COVID  . Sleep apnea    Past Surgical History:  Procedure Laterality Date  . BILATERAL CARPAL TUNNEL RELEASE    . TEE WITHOUT CARDIOVERSION N/A 03/28/2020   Procedure: TRANSESOPHAGEAL ECHOCARDIOGRAM (TEE);  Surgeon: Adrian Prows, MD;  Location: [facility];  Service: Cardiovascular;  Laterality: N/A;   Family History  Problem Relation Age of Onset  . Hypertension Mother   .  Thyroid disease Mother   . Hypertension Father   . Stroke Father 23  . Diabetes Father   . Sleep apnea Father   . CVA Paternal Uncle 47  . Heart failure Paternal Grandfather     Social History   Tobacco Use  . Smoking status: Never Smoker  . Smokeless tobacco: Never Used  Substance Use Topics  . Alcohol use: Yes    Alcohol/week: 0.0 standard drinks    Comment: occasional    Marital Status: Single   ROS  Review of Systems  Constitutional: Negative for malaise/fatigue and weight gain.  Cardiovascular: Positive for dyspnea on exertion (improving) and leg swelling (intermittent ). Negative for chest pain, claudication, near-syncope, orthopnea, palpitations, paroxysmal nocturnal dyspnea and syncope.  Respiratory: Negative for shortness of breath.   Hematologic/Lymphatic: Does not bruise/bleed easily.  Gastrointestinal: Negative for melena.  Neurological: Negative for dizziness and weakness.    Objective  Blood pressure (!) 134/95, pulse 97, temperature 98 F (36.7 C), height _0  (1.549 m), weight 240 lb (108.9 kg), SpO2 98 %.  Vitals with BMI 09/05/2020 08/03/2020 06/22/2020  Height _1  _2  _3   Weight 240 lbs 235 lbs 228 lbs  BMI 45.37 16.57 90.3  Systolic 833 383 291  Diastolic 95 90 82  Pulse 97 98 98      Physical Exam Vitals reviewed.  Constitutional:      Appearance: She is obese.  HENT:     Head: Normocephalic and atraumatic.  Cardiovascular:     Rate and Rhythm: Normal rate and  regular rhythm.     Pulses: Intact distal pulses.     Heart sounds: S1 normal and S2 normal. No murmur heard. No gallop.      Comments: No leg edema.  No JVD.  Pulmonary:     Effort: Pulmonary effort is normal. No respiratory distress.     Breath sounds: No wheezing, rhonchi or rales.  Musculoskeletal:     Right lower leg: No edema.     Left lower leg: No edema.  Skin:    General: Skin is warm and dry.  Neurological:     Mental Status: She is alert.     Cranial Nerves:  No cranial nerve deficit.     Gait: Gait abnormal (imprved, occasionally needs cane).     Laboratory examination:   Recent Labs    05/22/20 1624 06/07/20 1545 07/07/20 1559  NA 141 143 142  K 3.6 4.0 3.9  CL 111* 110* 113*  CO2 17* 21 16*  GLUCOSE 251* 116* 116*  BUN _0 CREATININE 0.95 0.96 0.95  CALCIUM 8.8 9.2 9.0  GFRNONAA 75 74 75  GFRAA 87 86 87   CrCl cannot be calculated (Patient's most recent lab result is older than the maximum 21 days allowed.).  CMP Latest Ref Rng & Units 07/07/2020 06/07/2020 05/22/2020  Glucose 65 - 99 mg/dL 116(H) 116(H) 251(H)  BUN 6 - 24 mg/dL _1 Creatinine 0.57 - 1.00 mg/dL 0.95 0.96 0.95  Sodium 134 - 144 mmol/L 142 143 141  Potassium 3.5 - 5.2 mmol/L 3.9 4.0 3.6  Chloride 96 - 106 mmol/L 113(H) 110(H) 111(H)  CO2 20 - 29 mmol/L 16(L) 21 17(L)  Calcium 8.7 - 10.2 mg/dL 9.0 9.2 8.8  Total Protein 6.5 - 8.1 g/dL - - -  Total Bilirubin 0.3 - 1.2 mg/dL - - -  Alkaline Phos 38 - 126 U/L - - -  AST 15 - 41 U/L - - -  ALT 0 - 44 U/L - - -   CBC Latest Ref Rng & Units 03/29/2020 03/27/2020 03/27/2020  WBC 4.0 - 10.5 K/uL 10.4 - 11.7(H)  Hemoglobin 12.0 - 15.0 g/dL 12.7 12.9 12.8  Hematocrit 36.0 - 46.0 % 38.3 38.0 39.3  Platelets 150 - 400 K/uL 245 - 195    Lipid Panel Recent Labs    03/28/20 0415  CHOL 176  TRIG 106  LDLCALC 126*  VLDL 21  HDL 29*  CHOLHDL 6.1    HEMOGLOBIN A1C Lab Results  Component Value Date   HGBA1C 11.2 (H) 03/27/2020   MPG 274.74 03/27/2020   TSH Recent Labs    03/28/20 0415  TSH 3.896    External labs:  None   Medications and allergies   Allergies  Allergen Reactions  . Lisinopril Cough  . Metformin Diarrhea  . Ibuprofen Hives  . Ketorolac Hives     Outpatient Medications Prior to Visit  Medication Sig Dispense Refill  . apixaban (ELIQUIS) 5 MG TABS tablet Take 1 tablet (5 mg total) by mouth 2 (two) times daily. 60 tablet 0  . Baclofen 5 MG TABS TAKE 1 TABLET BY MOUTH  THREE TIMES DAILY AS NEEDED FOR HEADACHE OR NECK PAIN    . Blood Glucose Monitoring Suppl (ACCU-CHEK NANO SMARTVIEW) W/DEVICE KIT 1 Device by Does not apply route 4 (four) times daily - after meals and at bedtime. 1 kit 0  . butalbital-acetaminophen-caffeine (FIORICET) 50-325-40 MG tablet Take 1 tablet by mouth every 6 (six) hours as  needed for headache. 20 tablet 0  . carvedilol (COREG) 25 MG tablet Take 1 tablet (25 mg total) by mouth 2 (two) times daily with a meal. 180 tablet 3  . escitalopram (LEXAPRO) 10 MG tablet Take 10 mg by mouth daily.    . fexofenadine (ALLEGRA) 180 MG tablet Take 180 mg by mouth daily as needed.     . fluticasone (FLONASE) 50 MCG/ACT nasal spray Place 2 sprays into both nostrils daily as needed for allergies.   3  . fluticasone (FLOVENT HFA) 110 MCG/ACT inhaler Inhale 1 puff into the lungs daily.     . insulin glargine (LANTUS) 100 UNIT/ML Solostar Pen Inject 80 Units into the skin 2 (two) times daily.    . insulin lispro (HUMALOG) 100 UNIT/ML injection Inject into the skin. Sliding scale    . Insulin Pen Needle (INSUPEN PEN NEEDLES) 32G X 4 MM MISC BD Pen Needles- brand specific. Inject insulin via insulin pen daily 200 each 3  . ivabradine (CORLANOR) 5 MG TABS tablet Take 1 tablet (5 mg total) by mouth 2 (two) times daily with a meal. 90 tablet 3  . Lancet Devices (ACCU-CHEK SOFTCLIX) lancets Use as instructed for blood glucose checks four times daily, before meals and at bedtime 100 each 5  . medroxyPROGESTERone (DEPO-PROVERA) 150 MG/ML injection Inject 150 mg into the muscle every 3 (three) months.    . methocarbamol (ROBAXIN) 500 MG tablet Take 1 tablet (500 mg total) by mouth every 8 (eight) hours as needed for muscle spasms. 30 tablet 0  . montelukast (SINGULAIR) 10 MG tablet Take 10 mg by mouth at bedtime.    . nortriptyline (PAMELOR) 10 MG capsule Take 10 mg by mouth at bedtime.    . ondansetron (ZOFRAN) 4 MG tablet Take 1 tablet (4 mg total) by mouth every 8  (eight) hours as needed for nausea or vomiting. 20 tablet 0  . pantoprazole (PROTONIX) 40 MG tablet Take 40 mg by mouth daily.    . rosuvastatin (CRESTOR) 20 MG tablet Take 1 tablet (20 mg total) by mouth daily at 6 PM. 30 tablet 0  . sacubitril-valsartan (ENTRESTO) 97-103 MG Take 1 tablet by mouth 2 (two) times daily. 180 tablet 3  . topiramate (TOPAMAX) 100 MG tablet Take 200 mg by mouth at bedtime.     . TRUEPLUS LANCETS 33G MISC USE TO CHECK BLOOD GLUCOSE QID - AFTER MEALS AND AT BEDTIME  3  . TRULICITY 3.30 QT/6.2UQ SOPN Inject 0.75 mg into the skin once a week.    . VENTOLIN HFA 108 (90 BASE) MCG/ACT inhaler Use as directed 2 puffs in the mouth or throat 4 (four) times daily as needed for shortness of breath.   3  . Continuous Blood Gluc Transmit (DEXCOM G6 TRANSMITTER) MISC See admin instructions.    Marland Kitchen glucose blood (ACCU-CHEK SMARTVIEW) test strip Check sugar 6 x daily (Patient taking differently: Check sugar 6 x daily. Uses true test instead) 200 each 3   No facility-administered medications prior to visit.     Radiology:   No results found. CT Code Stroke CTA Head W/WO contrast 03/27/2020 1. Negative for large vessel occlusion but positive for Right MCA M3 occlusion. CT Perfusion detects core infarct with minimally larger penumbra at the right insula/operculum corresponding to the plain CT finding.  2. Additionally, mild vessel irregularity is noted in multiple other circle-of-Willis branches, including the left A1, right P2, left M2. Although nonspecific this constellation of clinical and imaging findings might indicate  accelerated branch vessel atherosclerosis. Although other large vessels appear normal, with no atherosclerosis in the neck or at the aortic arch.  MR ANGIO HEAD WO CONTRAST 03/27/2020 1. Moderate-sized acute right MCA infarct. 2. Small subacute left parieto-occipital infarcts. 3. Right M3 branch occlusion, otherwise negative head [id number]  CT ABDOMEN PELVIS W  CONTRAST 03/27/2020 1. New 18 mm hypoattenuating lesion upper pole right kidney with areas of subcapsular multifocal decreased enhancement in the lower pole right kidney. Imaging features are nonspecific and could be related to pyelonephritis and phlegmon/evolving abscess in the upper pole right kidney cannot be excluded. Alternatively, multifocal right renal infarct could have this appearance. Correlation with urinalysis may prove helpful.  2. Relatively well-defined 2.2 cm lesion in the inferior lingula measures water density. This may be loculated pleural fluid or intraparenchymal fluid collection in this patient with a history of pulmonary embolus and bilateral ground-glass airspace disease on the previous study suspicious for multifocal atypical/viral pneumonia. Consider follow-up to ensure resolution.  Cardiac Studies:   PCV MYOCARDIAL PERFUSION WITH LEXISCAN 08/14/2020 Lexiscan nuclear stress test performed using 1-day protocol. Left ventricle is dilated. Severe decrease in global myocardial thickening and wall motion. Stress LVEF <20%. Large sized, medium intensity, fixed perfusion defect in anteroapical, apical, inferior, inferoseptal myocardium. High risk study,  PCV ECHOCARDIOGRAM COMPLETE 07/25/2020 Severely depressed LV systolic function with visual EF 25-30%. Hypokinetic global wall motion. Left ventricle cavity is dilated. Mild left ventricular hypertrophy. Doppler evidence of grade I (impaired) diastolic dysfunction, elevated LAP. Mild (Grade I) mitral regurgitation. Prior study dated 03/27/2020: LVEF 20-25%, regional wall motion abnormalities, grade 2 diastolic dysfunction, moderately dilated left atrium, trivial MR. Recommendations: May consider limited echo to reevaluate LVEF with Definity or consider MUGA scan to better evaluate LVEF.   LONG TERM MONITOR (8-14 DAYS) 06/22/20-07/06/20:  Predominant underlying rhythm was sinus with minimum heart rate of 71 bpm, maximum heart rate  of 193 bpm average heart rate of 91 bpm.  There were no patient triggered events.  PACs and PVCs were rare.  Patient with intermittent right bundle branch block.  2 episodes of supraventricular tachycardia with the longest lasting 12 seconds and a maximum heart rate of 93 bpm.  No evidence of atrial fibrillation.  No ventricular tachycardia, high degree AV block, pauses >3 seconds.  ECHOCARDIOGRAM COMPLETE BUBBLE STUDY 03/27/2020 1. Left ventricular ejection fraction, by estimation, is 20 to 25%. The left ventricle has severely decreased function. There is severe hypokinesis of the basal-to-apical anteroseptum, inferoseptum and anterior LV walls . Left ventricular diastolic  parameters are consistent with Grade II diastolic dysfunction (pseudonormalization). 2. Right ventricular systolic function is normal. The right ventricular size is normal. 3. Left atrial size was moderately dilated. 4. The mitral valve is normal in structure. Trivial mitral valve regurgitation. 5. The aortic valve is tricuspid. There is mild thickening of the aortic valve. Aortic valve regurgitation is not visualized. 6. The inferior vena cava is normal in size with greater than 50% respiratory variability, suggesting right atrial pressure of 3 mmHg. 7. Agitated saline contrast bubble study was negative, with no evidence of any interatrial shunt.  Comparison(s): Compared to prior echo in 06/2019, the LVEF is now severely reduced to 20-25% with severe hypokinesis of the inferoseptal, anteroseptal and anterior LV walls.  EKG:   EKG 04/18/2020: Sinus rhythm at a rate of 90 bpm. Left axis deviation. PRWP, cannot exclude anteroseptal infarct old. Non-specific T wave abnormality.   EKG 03/27/2020: Sinus tachycardia at rate of 102 bpm, normal axis, borderline  criteria for LVH. Borderline QT elongation. Compared to 06/30/2019, sinus tachycardia new. No change in QT elongation.  Assessment     ICD-10-CM   1. Dilated  cardiomyopathy ([place])  I42.0   2. Chronic systolic heart failure ([facility])  I50.22      There are no discontinued medications.  No orders of the defined types were placed in this encounter.   Recommendations:   Shelly Shields is a 40 y.o. African-American female with hypertension, hyperlipidemia, uncontrolled diabetes mellitus, obesity and obstructive sleep apnea on CPAP and history of Covid pneumonia in January 2021 and also had pulmonary embolism during Covid infection.  She is also being evaluated for vasculitis. 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 Cardiomyopathy etiology likely stress cardiomyopathy vs significant coronary artery disease vs small PFO noted on TEE vs atrial fibrillation.   Patient presents for follow-up to discuss results of recent nuclear stress test which revealed large medium intensity fixed perfusion defect.  At last visit increase Corlanor to 5 mg twice daily.  Patient is tolerating guideline directed medical therapy without issue, will not make changes to her medications at this time.  Reviewed and discussed with patient and her mother who is present at bedside results of nuclear stress test, details above.  Discussed management options including proceeding with cardiac catheterization in view of nuclear stress test consistent with apical, inferior, septal infarct.The left heart catheterization procedure was explained to the patient in detail. The indication, alternatives, risks and benefits were reviewed. Complications including but not limited to bleeding, infection, acute kidney injury, blood transfusion, heart rhythm disturbances, contrast (dye) reaction, damage to the arteries or nerves in the legs or hands, cerebrovascular accident, myocardial infarction, need for emergent bypass surgery, blood clots in the legs, possible need for emergent blood transfusion, and rarely  death were reviewed and discussed with the patient. The patient wishes to independently research and discuss with her family prior to proceeding with cardiac catheterization.  Counseled patient regarding signs and symptoms that would warrant urgent or emergent evaluation.  Patient verbalized understanding agreement.  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  She expresses significant motivation to return to work, even with restrictions in order to avoid further financial hardship.  I have advised patient she may return to work with restrictions, and counseled her regarding signs and symptoms of which to notify our office.  Patient will notify our office of her decision regarding cardiac catheterization.  Plan to follow-up as previously scheduled in 2 months, unless procedural timing changes this.    Alethia Berthold, PA-C 09/05/2020, 2:09 PM Office: [phone]

## 2020-10-02 ENCOUNTER — Telehealth: Payer: Self-pay

## 2020-10-02 ENCOUNTER — Other Ambulatory Visit: Payer: Self-pay | Admitting: Student

## 2020-10-02 MED ORDER — FUROSEMIDE 40 MG PO TABS: 40.0000 mg | ORAL_TABLET | Freq: Every day | ORAL | 3 refills | Status: DC | PRN

## 2020-10-02 MED ORDER — POTASSIUM CHLORIDE CRYS ER 20 MEQ PO TBCR: 20.0000 meq | EXTENDED_RELEASE_TABLET | Freq: Every day | ORAL | 3 refills | Status: DC

## 2020-10-02 NOTE — Progress Notes (Signed)
Patient called with worsening bilateral lower leg edema, have advised her to take Lasix 40 mg daily as needed for volume overload.  Also advised patient to take potassium supplement with Lasix.

## 2020-10-02 NOTE — Telephone Encounter (Signed)
I have sent orders for Lasix 40 mg daily as needed also potassium supplement to be taken with Lasix.  Please advise patient to take Lasix 40 mg once daily for the next 2 days, after that she may continue to take Lasix as needed for swelling or shortness of breath.  Is also advised patient to elevate her legs as much as possible.

## 2020-10-02 NOTE — Telephone Encounter (Signed)
Pt called and stated that she started work yesterday and her feet have swelled up, and they're still swollen today. Pt is taking her medication. Her bp is 115/81, Pts HR is 77. She is not on lasix. She would like to know what she can do to make the swelling go down.

## 2020-10-03 NOTE — Telephone Encounter (Signed)
Called and spoke to pt regarding lasix and potassium. Pt voiced understanding.

## 2020-10-03 NOTE — Telephone Encounter (Signed)
Attempted to call pt, no answer. Left vm requesting call back.

## 2020-11-02 NOTE — Progress Notes (Signed)
Primary Physician/Referring:  [name], MD  Patient ID: Shelly Shields, female    DOB: 1980/07/12, 40 y.o.   MRN: [id number]  Chief Complaint  Patient presents with   Dilated cardiomyopathy    Follow-up   Heart Failure   HPI:    Shelly Shields  is a 40 y.o. African-American female with hypertension, hyperlipidemia, uncontrolled diabetes mellitus, obesity and obstructive sleep apnea on CPAP and history of Covid pneumonia in January 2021 and also had pulmonary embolism during Covid infection.  She is also being evaluated for vasculitis. 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  Patient underwent nuclear stress test which revealed large medium intensity fixed perfusion defect, suggestive of ischemic etiology for cardiomyopathy.    Patient presents for 2 month follow up.  At last visit given abnormal nuclear stress test findings discussed management options including undergoing left heart catheterization, patient had opted to hold off on cardiac catheterization in first to independent research as she was feeling well at the time.  Following last visit patient was advised she could return to work with limited restrictions.  Following last visit patient called the office on 10/02/2020 with increased bilateral lower leg edema, therefore prescribed as needed furosemide.  Patient reports she has been doing well with return to work, however she has noticed increased swelling, particularly on the days that she is working and on her feet a significant amount.  She is back to working 30 hours/week.  Patient denies chest pain, palpitations, orthopnea, PND, dizziness.  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 hauler    Diabetes mellitus without complication ([place])    Hypertension    Migraines    Obesity    Pulmonary embolism ([place]) 05/2019   with COVID   Sleep apnea     Past Surgical History:  Procedure Laterality Date   BILATERAL CARPAL TUNNEL RELEASE     TEE WITHOUT CARDIOVERSION N/A 03/28/2020   Procedure: TRANSESOPHAGEAL ECHOCARDIOGRAM (TEE);  Surgeon: Adrian Prows, MD;  Location: [facility] ENDOSCOPY;  Service: Cardiovascular;  Laterality: N/A;   Family History  Problem Relation Age of Onset   Hypertension Mother    Thyroid disease Mother    Hypertension Father    Stroke Father 36   Diabetes Father    Sleep apnea Father    CVA Paternal Uncle 70   Heart failure Paternal Grandfather     Social History   Tobacco Use   Smoking status: Never   Smokeless tobacco: Never  Substance Use Topics   Alcohol use: Yes    Alcohol/week: 0.0 standard drinks    Comment: occasional    Marital Status: Single   ROS  Review of Systems  Constitutional: Negative for malaise/fatigue and weight gain.  Cardiovascular:  Positive for leg swelling (intermittent ). Negative for chest pain, claudication, dyspnea on exertion (resolved), near-syncope, orthopnea, palpitations, paroxysmal nocturnal dyspnea and syncope.  Respiratory:  Negative for shortness of breath.   Hematologic/Lymphatic: Does not bruise/bleed easily.  Gastrointestinal:  Negative for melena.  Neurological:  Negative for dizziness and weakness.   Objective  Blood pressure 130/72, pulse 87, temperature 98 F (36.7 C), height _0  (1.549 m), weight 228 lb (103.4 kg), SpO2 97 %.  Vitals with BMI 11/03/2020 09/05/2020 08/03/2020  Height _1  _2  _3   Weight 228 lbs 240 lbs 235 lbs  BMI 43.1 44.81 85.63  Systolic  329 924 268  Diastolic 72 95 90  Pulse 87 97 98      Physical Exam Vitals reviewed.  Constitutional:      Appearance: She is obese.  HENT:     Head: Normocephalic and atraumatic.  Cardiovascular:     Rate and Rhythm: Normal rate and regular rhythm.     Pulses: Intact distal pulses.     Heart sounds: S1 normal and S2 normal. No murmur heard.   No gallop.     Comments: No JVD.   Pulmonary:     Effort: Pulmonary effort is normal. No respiratory distress.     Breath sounds: No wheezing, rhonchi or rales.  Musculoskeletal:     Right lower leg: Edema (trace) present.     Left lower leg: Edema (trace) present.  Skin:    General: Skin is warm and dry.  Neurological:     Mental Status: She is alert.     Cranial Nerves: No cranial nerve deficit.     Gait: Gait abnormal (imprved, occasionally needs cane).    Laboratory examination:   Recent Labs    05/22/20 1624 06/07/20 1545 07/07/20 1559  NA 141 143 142  K 3.6 4.0 3.9  CL 111* 110* 113*  CO2 17* 21 16*  GLUCOSE 251* 116* 116*  BUN _0 CREATININE 0.95 0.96 0.95  CALCIUM 8.8 9.2 9.0  GFRNONAA 75 74 75  GFRAA 87 86 87   CrCl cannot be calculated (Patient's most recent lab result is older than the maximum 21 days allowed.).  CMP Latest Ref Rng & Units 07/07/2020 06/07/2020 05/22/2020  Glucose 65 - 99 mg/dL 116(H) 116(H) 251(H)  BUN 6 - 24 mg/dL _1 Creatinine 0.57 - 1.00 mg/dL 0.95 0.96 0.95  Sodium 134 - 144 mmol/L 142 143 141  Potassium 3.5 - 5.2 mmol/L 3.9 4.0 3.6  Chloride 96 - 106 mmol/L 113(H) 110(H) 111(H)  CO2 20 - 29 mmol/L 16(L) 21 17(L)  Calcium 8.7 - 10.2 mg/dL 9.0 9.2 8.8  Total Protein 6.5 - 8.1 g/dL - - -  Total Bilirubin 0.3 - 1.2 mg/dL - - -  Alkaline Phos 38 - 126 U/L - - -  AST 15 - 41 U/L - - -  ALT 0 - 44 U/L - - -   CBC Latest Ref Rng & Units 03/29/2020 03/27/2020 03/27/2020  WBC 4.0 - 10.5 K/uL 10.4 - 11.7(H)  Hemoglobin 12.0 - 15.0 g/dL 12.7 12.9 12.8  Hematocrit 36.0 - 46.0 % 38.3 38.0 39.3  Platelets 150 - 400 K/uL 245 - 195    Lipid Panel Recent Labs    03/28/20 0415  CHOL 176  TRIG 106  LDLCALC 126*  VLDL 21  HDL 29*  CHOLHDL 6.1    HEMOGLOBIN A1C Lab Results  Component Value Date   HGBA1C 11.2 (H) 03/27/2020   MPG 274.74 03/27/2020   TSH Recent Labs    03/28/20 0415  TSH 3.896    External labs:  None  Allergies   Allergies   Allergen Reactions   Lisinopril Cough   Metformin Diarrhea   Ibuprofen Hives   Ketorolac Hives      Medications Prior to Visit:   Outpatient Medications Prior to Visit  Medication Sig Dispense Refill   apixaban (ELIQUIS) 5 MG TABS tablet Take 1 tablet (5 mg total) by mouth 2 (two) times daily. 60 tablet 0   Baclofen 5 MG TABS TAKE 1 TABLET BY MOUTH THREE TIMES DAILY  AS NEEDED FOR HEADACHE OR NECK PAIN     Blood Glucose Monitoring Suppl (ACCU-CHEK NANO SMARTVIEW) W/DEVICE KIT 1 Device by Does not apply route 4 (four) times daily - after meals and at bedtime. 1 kit 0   butalbital-acetaminophen-caffeine (FIORICET) 50-325-40 MG tablet Take 1 tablet by mouth every 6 (six) hours as needed for headache. 20 tablet 0   Continuous Blood Gluc Sensor (DEXCOM G6 SENSOR) MISC SMARTSIG:1 Topical Every 10 Days     Continuous Blood Gluc Transmit (DEXCOM G6 TRANSMITTER) MISC See admin instructions.     escitalopram (LEXAPRO) 10 MG tablet Take 10 mg by mouth daily.     fexofenadine (ALLEGRA) 180 MG tablet Take 180 mg by mouth daily as needed.      fluticasone (FLONASE) 50 MCG/ACT nasal spray Place 2 sprays into both nostrils daily as needed for allergies.   3   fluticasone (FLOVENT HFA) 110 MCG/ACT inhaler Inhale 1 puff into the lungs daily.      furosemide (LASIX) 40 MG tablet Take 1 tablet (40 mg total) by mouth daily as needed for fluid or edema. 30 tablet 3   glucose blood (ACCU-CHEK SMARTVIEW) test strip Check sugar 6 x daily (Patient taking differently: Check sugar 6 x daily. Uses true test instead) 200 each 3   insulin glargine (LANTUS) 100 UNIT/ML Solostar Pen Inject 80 Units into the skin 2 (two) times daily.     insulin lispro (HUMALOG) 100 UNIT/ML injection Inject into the skin. Sliding scale     Insulin Pen Needle (INSUPEN PEN NEEDLES) 32G X 4 MM MISC BD Pen Needles- brand specific. Inject insulin via insulin pen daily 200 each 3   ivabradine (CORLANOR) 5 MG TABS tablet Take 1 tablet (5 mg  total) by mouth 2 (two) times daily with a meal. 90 tablet 3   Lancet Devices (ACCU-CHEK SOFTCLIX) lancets Use as instructed for blood glucose checks four times daily, before meals and at bedtime 100 each 5   medroxyPROGESTERone (DEPO-PROVERA) 150 MG/ML injection Inject 150 mg into the muscle every 3 (three) months.     montelukast (SINGULAIR) 10 MG tablet Take 10 mg by mouth at bedtime.     nortriptyline (PAMELOR) 10 MG capsule Take 10 mg by mouth at bedtime.     ondansetron (ZOFRAN) 4 MG tablet Take 1 tablet (4 mg total) by mouth every 8 (eight) hours as needed for nausea or vomiting. 20 tablet 0   pantoprazole (PROTONIX) 40 MG tablet Take 40 mg by mouth daily.     potassium chloride SA (KLOR-CON) 20 MEQ tablet Take 1 tablet (20 mEq total) by mouth daily. 30 tablet 3   promethazine (PHENERGAN) 25 MG tablet Take 25 mg by mouth every 6 (six) hours as needed.     rosuvastatin (CRESTOR) 20 MG tablet Take 1 tablet (20 mg total) by mouth daily at 6 PM. 30 tablet 0   sacubitril-valsartan (ENTRESTO) 97-103 MG Take 1 tablet by mouth 2 (two) times daily. 180 tablet 3   topiramate (TOPAMAX) 100 MG tablet Take 200 mg by mouth at bedtime.      TRUEPLUS LANCETS 33G MISC USE TO CHECK BLOOD GLUCOSE QID - AFTER MEALS AND AT BEDTIME  3   TRULICITY 1.10 YT/1.1NB SOPN Inject 0.75 mg into the skin once a week.     VENTOLIN HFA 108 (90 BASE) MCG/ACT inhaler Use as directed 2 puffs in the mouth or throat 4 (four) times daily as needed for shortness of breath.   3  carvedilol (COREG) 25 MG tablet Take 1 tablet (25 mg total) by mouth 2 (two) times daily with a meal. 180 tablet 3   methocarbamol (ROBAXIN) 500 MG tablet Take 1 tablet (500 mg total) by mouth every 8 (eight) hours as needed for muscle spasms. 30 tablet 0   FARXIGA 10 MG TABS tablet Take 10 mg by mouth daily. (Patient not taking: Reported on 11/03/2020)     No facility-administered medications prior to visit.     Final Medications at End of Visit     Current Meds  Medication Sig   apixaban (ELIQUIS) 5 MG TABS tablet Take 1 tablet (5 mg total) by mouth 2 (two) times daily.   Baclofen 5 MG TABS TAKE 1 TABLET BY MOUTH THREE TIMES DAILY AS NEEDED FOR HEADACHE OR NECK PAIN   Blood Glucose Monitoring Suppl (ACCU-CHEK NANO SMARTVIEW) W/DEVICE KIT 1 Device by Does not apply route 4 (four) times daily - after meals and at bedtime.   butalbital-acetaminophen-caffeine (FIORICET) 50-325-40 MG tablet Take 1 tablet by mouth every 6 (six) hours as needed for headache.   Continuous Blood Gluc Sensor (DEXCOM G6 SENSOR) MISC SMARTSIG:1 Topical Every 10 Days   Continuous Blood Gluc Transmit (DEXCOM G6 TRANSMITTER) MISC See admin instructions.   escitalopram (LEXAPRO) 10 MG tablet Take 10 mg by mouth daily.   fexofenadine (ALLEGRA) 180 MG tablet Take 180 mg by mouth daily as needed.    fluticasone (FLONASE) 50 MCG/ACT nasal spray Place 2 sprays into both nostrils daily as needed for allergies.    fluticasone (FLOVENT HFA) 110 MCG/ACT inhaler Inhale 1 puff into the lungs daily.    furosemide (LASIX) 40 MG tablet Take 1 tablet (40 mg total) by mouth daily as needed for fluid or edema.   glucose blood (ACCU-CHEK SMARTVIEW) test strip Check sugar 6 x daily (Patient taking differently: Check sugar 6 x daily. Uses true test instead)   insulin glargine (LANTUS) 100 UNIT/ML Solostar Pen Inject 80 Units into the skin 2 (two) times daily.   insulin lispro (HUMALOG) 100 UNIT/ML injection Inject into the skin. Sliding scale   Insulin Pen Needle (INSUPEN PEN NEEDLES) 32G X 4 MM MISC BD Pen Needles- brand specific. Inject insulin via insulin pen daily   ivabradine (CORLANOR) 5 MG TABS tablet Take 1 tablet (5 mg total) by mouth 2 (two) times daily with a meal.   Lancet Devices (ACCU-CHEK SOFTCLIX) lancets Use as instructed for blood glucose checks four times daily, before meals and at bedtime   medroxyPROGESTERone (DEPO-PROVERA) 150 MG/ML injection Inject 150 mg into the  muscle every 3 (three) months.   montelukast (SINGULAIR) 10 MG tablet Take 10 mg by mouth at bedtime.   nortriptyline (PAMELOR) 10 MG capsule Take 10 mg by mouth at bedtime.   ondansetron (ZOFRAN) 4 MG tablet Take 1 tablet (4 mg total) by mouth every 8 (eight) hours as needed for nausea or vomiting.   pantoprazole (PROTONIX) 40 MG tablet Take 40 mg by mouth daily.   potassium chloride SA (KLOR-CON) 20 MEQ tablet Take 1 tablet (20 mEq total) by mouth daily.   promethazine (PHENERGAN) 25 MG tablet Take 25 mg by mouth every 6 (six) hours as needed.   rosuvastatin (CRESTOR) 20 MG tablet Take 1 tablet (20 mg total) by mouth daily at 6 PM.   sacubitril-valsartan (ENTRESTO) 97-103 MG Take 1 tablet by mouth 2 (two) times daily.   topiramate (TOPAMAX) 100 MG tablet Take 200 mg by mouth at bedtime.  TRUEPLUS LANCETS 33G MISC USE TO CHECK BLOOD GLUCOSE QID - AFTER MEALS AND AT BEDTIME   TRULICITY 9.44 HQ/7.5FF SOPN Inject 0.75 mg into the skin once a week.   VENTOLIN HFA 108 (90 BASE) MCG/ACT inhaler Use as directed 2 puffs in the mouth or throat 4 (four) times daily as needed for shortness of breath.    [DISCONTINUED] carvedilol (COREG) 25 MG tablet Take 1 tablet (25 mg total) by mouth 2 (two) times daily with a meal.   [DISCONTINUED] methocarbamol (ROBAXIN) 500 MG tablet Take 1 tablet (500 mg total) by mouth every 8 (eight) hours as needed for muscle spasms.    Radiology:   No results found. CT Code Stroke CTA Head W/WO contrast 03/27/2020 1. Negative for large vessel occlusion but positive for Right MCA M3 occlusion. CT Perfusion detects core infarct with minimally larger penumbra at the right insula/operculum corresponding to the plain CT finding.  2. Additionally, mild vessel irregularity is noted in multiple other circle-of-Willis branches, including the left A1, right P2, left M2. Although nonspecific this constellation of clinical and imaging findings might indicate accelerated branch vessel  atherosclerosis. Although other large vessels appear normal, with no atherosclerosis in the neck or at the aortic arch.   MR ANGIO HEAD WO CONTRAST  03/27/2020  1. Moderate-sized acute right MCA infarct. 2. Small subacute left parieto-occipital infarcts. 3. Right M3 branch occlusion, otherwise negative head MRA.[id number]   CT ABDOMEN PELVIS W CONTRAST  03/27/2020  1. New 18 mm hypoattenuating lesion upper pole right kidney with areas of subcapsular multifocal decreased enhancement in the lower pole right kidney. Imaging features are nonspecific and could be related to pyelonephritis and phlegmon/evolving abscess in the upper pole right kidney cannot be excluded. Alternatively, multifocal right renal infarct could have this appearance. Correlation with urinalysis may prove helpful.  2. Relatively well-defined 2.2 cm lesion in the inferior lingula measures water density. This may be loculated pleural fluid or intraparenchymal fluid collection in this patient with a history of pulmonary embolus and bilateral ground-glass airspace disease on the previous study suspicious for multifocal atypical/viral pneumonia. Consider follow-up to ensure resolution.  Cardiac Studies:   PCV MYOCARDIAL PERFUSION WITH LEXISCAN 08/14/2020 Lexiscan nuclear stress test performed using 1-day protocol. Left ventricle is dilated. Severe decrease in global myocardial thickening and wall motion. Stress LVEF <20%. Large sized, medium intensity, fixed perfusion defect in anteroapical, apical, inferior, inferoseptal myocardium. High risk study  PCV ECHOCARDIOGRAM COMPLETE 07/25/2020 Severely depressed LV systolic function with visual EF 25-30%. Hypokinetic global wall motion. Left ventricle cavity is dilated. Mild left ventricular hypertrophy. Doppler evidence of grade I (impaired) diastolic dysfunction, elevated LAP. Mild (Grade I) mitral regurgitation. Prior study dated 03/27/2020: LVEF 20-25%, regional wall motion abnormalities,  grade 2 diastolic dysfunction, moderately dilated left atrium, trivial MR. Recommendations: May consider limited echo to reevaluate LVEF with Definity or consider MUGA scan to better evaluate LVEF.   LONG TERM MONITOR (8-14 DAYS) 06/22/20-07/06/20:  Predominant underlying rhythm was sinus with minimum heart rate of 71 bpm, maximum heart rate of 193 bpm average heart rate of 91 bpm.  There were no patient triggered events.  PACs and PVCs were rare.  Patient with intermittent right bundle branch block.  2 episodes of supraventricular tachycardia with the longest lasting 12 seconds and a maximum heart rate of 93 bpm.  No evidence of atrial fibrillation.  No ventricular tachycardia, high degree AV block, pauses >3 seconds.  ECHOCARDIOGRAM COMPLETE BUBBLE STUDY  03/27/2020 1. Left ventricular ejection fraction,  by estimation, is 20 to 25%. The left ventricle has severely decreased function. There is severe hypokinesis of the basal-to-apical anteroseptum, inferoseptum and anterior LV walls . Left ventricular diastolic  parameters are consistent with Grade II diastolic dysfunction (pseudonormalization).  2. Right ventricular systolic function is normal. The right ventricular size is normal.  3. Left atrial size was moderately dilated.  4. The mitral valve is normal in structure. Trivial mitral valve regurgitation.  5. The aortic valve is tricuspid. There is mild thickening of the aortic valve. Aortic valve regurgitation is not visualized.  6. The inferior vena cava is normal in size with greater than 50% respiratory variability, suggesting right atrial pressure of 3 mmHg.  7. Agitated saline contrast bubble study was negative, with no evidence of any interatrial shunt.   Comparison(s): Compared to prior echo in 06/2019, the LVEF is now severely reduced to 20-25% with severe hypokinesis of the inferoseptal, anteroseptal and anterior LV walls.  EKG:   EKG 04/18/2020: Sinus rhythm at a rate of 90 bpm. Left  axis deviation. PRWP, cannot exclude anteroseptal infarct old. Non-specific T wave abnormality.   EKG 03/27/2020: Sinus tachycardia at rate of 102 bpm, normal axis, borderline criteria for LVH.  Borderline QT elongation.  Compared to 06/30/2019, sinus tachycardia new.  No change in QT elongation.  Assessment     ICD-10-CM   1. Chronic systolic heart failure (HCC)  I50.22 EKG 12-Lead    carvedilol (COREG) 25 MG tablet    2. Dilated cardiomyopathy (HCC)  I42.0 EKG 12-Lead    carvedilol (COREG) 25 MG tablet    3. Ischemic cardiomyopathy  I25.5     4. Hypercholesteremia  E78.00     5. Essential hypertension  I10     6. History of pulmonary embolus (PE)  Z86.711        Medications Discontinued During This Encounter  Medication Reason   FARXIGA 10 MG TABS tablet Error   methocarbamol (ROBAXIN) 500 MG tablet Error   carvedilol (COREG) 25 MG tablet     Meds ordered this encounter  Medications   carvedilol (COREG) 25 MG tablet    Sig: Take 2 tablets (50 mg total) by mouth 2 (two) times daily with a meal.    Dispense:  180 tablet    Refill:  3    Recommendations:   Shelly Shields is a 40 y.o. African-American female with hypertension, hyperlipidemia, uncontrolled diabetes mellitus, obesity and obstructive sleep apnea on CPAP and history of Covid pneumonia in January 2021 and also had pulmonary embolism during Covid infection.  She is also being evaluated for vasculitis. 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 Patient underwent nuclear stress test which revealed large medium intensity fixed perfusion defect, suggestive of ischemic etiology for cardiomyopathy.    Patient presents for 2 month follow up.  At last visit given abnormal nuclear stress test findings discussed management options including undergoing left heart catheterization, patient had opted to hold off on cardiac  catheterization in first to independent research as she was feeling well at the time.  Following last visit patient was advised she could return to work with limited restrictions.  Following last visit patient called the office on 10/02/2020 with increased bilateral lower leg edema, therefore prescribed as needed furosemide.  Patient is doing relatively well without clinical evidence of acute decompensated heart failure at today's visit.  Heart rate remains >70 bpm and blood pressure is  borderline elevated.  We will therefore increase carvedilol from 25 mg to 50 mg twice daily.  Patient will continue to monitor blood pressure and heart rate at home and bring a written log to her next appointment.  In regard to leg swelling, encourage patient to wear support stockings, particularly when she is going to be working.  Also advised her regarding as needed Lasix dosing, she verbalized understanding agreement.  Patient is congratulated on weight loss, and again encouraged regarding diet and lifestyle modifications.  In regard to abnormal stress test, discussed again with patient recommendation to proceed with cardiac catheterization.  Discussed the risks versus benefits of the procedure as well as risks of postponing cardiac catheterization further.  Patient verbalized understanding of the risks, however she continues to prefer to hold off given lack of time off from work available.  Patient understands risk of further progression of coronary artery disease if she continues to postpone cardiac catheterization however she elects to postpone spite of these risks.  Counseled patient at length regarding signs and symptoms that would warrant urgent/emergent evaluation, she verbalized understanding and agreement.  Patient continues to tolerate anticoagulation without bleeding diathesis, will continue Eliquis.  Follow-up in 4 months, sooner if needed, for heart failure and discussion of cardiac catheterization.   Alethia Berthold, PA-C 11/03/2020, 2:39 PM Office: [phone]

## 2020-11-03 ENCOUNTER — Other Ambulatory Visit: Payer: Self-pay

## 2020-11-03 ENCOUNTER — Ambulatory Visit: Payer: 59 | Admitting: Student

## 2020-11-03 ENCOUNTER — Encounter: Payer: Self-pay | Admitting: Student

## 2020-11-03 VITALS — BP 130/72 | HR 87 | Temp 98.0°F | Ht 61.0 in | Wt 228.0 lb

## 2020-11-03 DIAGNOSIS — E7800 Pure hypercholesterolemia, unspecified: Secondary | ICD-10-CM

## 2020-11-03 DIAGNOSIS — I255 Ischemic cardiomyopathy: Secondary | ICD-10-CM

## 2020-11-03 DIAGNOSIS — Z86711 Personal history of pulmonary embolism: Secondary | ICD-10-CM

## 2020-11-03 DIAGNOSIS — I420 Dilated cardiomyopathy: Secondary | ICD-10-CM

## 2020-11-03 DIAGNOSIS — I5022 Chronic systolic (congestive) heart failure: Secondary | ICD-10-CM

## 2020-11-03 DIAGNOSIS — I10 Essential (primary) hypertension: Secondary | ICD-10-CM

## 2020-11-03 MED ORDER — CARVEDILOL 25 MG PO TABS
50.0000 mg | ORAL_TABLET | Freq: Two times a day (BID) | ORAL | 3 refills | Status: DC
Start: 2020-11-03 — End: 2021-01-16

## 2020-11-20 ENCOUNTER — Encounter: Payer: Self-pay | Admitting: Student

## 2020-12-21 ENCOUNTER — Ambulatory Visit
Admission: EM | Admit: 2020-12-21 | Discharge: 2020-12-21 | Disposition: A | Discharge status: Home / Self Care | Payer: 59 | Attending: Urgent Care | Admitting: Urgent Care

## 2020-12-21 ENCOUNTER — Other Ambulatory Visit: Payer: Self-pay

## 2020-12-21 ENCOUNTER — Ambulatory Visit (INDEPENDENT_AMBULATORY_CARE_PROVIDER_SITE_OTHER): Payer: 59

## 2020-12-21 DIAGNOSIS — M79672 Pain in left foot: Secondary | ICD-10-CM

## 2020-12-21 DIAGNOSIS — E139 Other specified diabetes mellitus without complications: Secondary | ICD-10-CM

## 2020-12-21 DIAGNOSIS — Z86711 Personal history of pulmonary embolism: Secondary | ICD-10-CM

## 2020-12-21 MED ORDER — PREDNISONE 20 MG PO TABS: 20.0000 mg | ORAL_TABLET | Freq: Every day | ORAL | 0 refills | Status: DC

## 2020-12-21 MED ORDER — TRAMADOL HCL 50 MG PO TABS: 50.0000 mg | ORAL_TABLET | Freq: Four times a day (QID) | ORAL | 0 refills | Status: DC | PRN

## 2020-12-21 NOTE — ED Triage Notes (Addendum)
Onset yesterday of throbbing and stabbing pain in the left plantar surface of her foot. No known injuries. Walking aggravates sxs. Pain is causing Pt to ambulate with a cane. No meds taken. No right foot pain. Pain does not radiate to her leg. Pt requesting XR, noting that her provider thought that she may have a stress fracture.

## 2020-12-21 NOTE — ED Provider Notes (Signed)
[place]   MRN: [id number] DOB: 09-Oct-1980  Subjective:   Shelly Shields is a 40 y.o. female presenting for 1 day history of acute onset moderate to severe left-sided foot pain over the arch and middle of her foot.  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  Denies fall, trauma, bruising, open wounds.  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  Has a history of pulmonary embolism and therefore cannot take NSAIDs.  Would like to have an x-ray done as her regular doctor recommended this for rule out of a stress fracture.  No current facility-administered medications for this encounter.  Current Outpatient Medications:    apixaban (ELIQUIS) 5 MG TABS tablet, Take 1 tablet (5 mg total) by mouth 2 (two) times daily., Disp: 60 tablet, Rfl: 0   Baclofen 5 MG TABS, TAKE 1 TABLET BY MOUTH THREE TIMES DAILY AS NEEDED FOR HEADACHE OR NECK PAIN, Disp: , Rfl:    Blood Glucose Monitoring Suppl (ACCU-CHEK NANO SMARTVIEW) W/DEVICE KIT, 1 Device by Does not apply route 4 (four) times daily - after meals and at bedtime., Disp: 1 kit, Rfl: 0   butalbital-acetaminophen-caffeine (FIORICET) 50-325-40 MG tablet, Take 1 tablet by mouth every 6 (six) hours as needed for headache., Disp: 20 tablet, Rfl: 0   carvedilol (COREG) 25 MG tablet, Take 2 tablets (50 mg total) by mouth 2 (two) times daily with a meal., Disp: 180 tablet, Rfl: 3   Continuous Blood Gluc Sensor (DEXCOM G6 SENSOR) MISC, SMARTSIG:1 Topical Every 10 Days, Disp: , Rfl:    Continuous Blood Gluc Transmit (DEXCOM G6 TRANSMITTER) MISC, See admin instructions., Disp: , Rfl:    escitalopram (LEXAPRO) 10 MG tablet, Take 10 mg by mouth daily., Disp: , Rfl:    fexofenadine (ALLEGRA) 180 MG tablet, Take 180 mg by mouth daily as needed. , Disp: , Rfl:    fluticasone (FLONASE) 50 MCG/ACT nasal spray, Place 2 sprays into both nostrils daily as needed for allergies. , Disp: , Rfl: 3   fluticasone (FLOVENT  HFA) 110 MCG/ACT inhaler, Inhale 1 puff into the lungs daily. , Disp: , Rfl:    furosemide (LASIX) 40 MG tablet, Take 1 tablet (40 mg total) by mouth daily as needed for fluid or edema., Disp: 30 tablet, Rfl: 3   glucose blood (ACCU-CHEK SMARTVIEW) test strip, Check sugar 6 x daily (Patient taking differently: Check sugar 6 x daily. Uses true test instead), Disp: 200 each, Rfl: 3   insulin glargine (LANTUS) 100 UNIT/ML Solostar Pen, Inject 80 Units into the skin 2 (two) times daily., Disp: , Rfl:    insulin lispro (HUMALOG) 100 UNIT/ML injection, Inject into the skin. Sliding scale, Disp: , Rfl:    Insulin Pen Needle (INSUPEN PEN NEEDLES) 32G X 4 MM MISC, BD Pen Needles- brand specific. Inject insulin via insulin pen daily, Disp: 200 each, Rfl: 3   ivabradine (CORLANOR) 5 MG TABS tablet, Take 1 tablet (5 mg total) by mouth 2 (two) times daily with a meal., Disp: 90 tablet, Rfl: 3   Lancet Devices (ACCU-CHEK SOFTCLIX) lancets, Use as instructed for blood glucose checks four times daily, before meals and at bedtime, Disp: 100 each, Rfl: 5   medroxyPROGESTERone (DEPO-PROVERA) 150 MG/ML injection, Inject 150 mg into the muscle every 3 (three) months., Disp: , Rfl:    montelukast (SINGULAIR) 10 MG tablet, Take 10 mg by mouth at bedtime., Disp: , Rfl:    nortriptyline (PAMELOR) 10 MG capsule,  Take 10 mg by mouth at bedtime., Disp: , Rfl:    ondansetron (ZOFRAN) 4 MG tablet, Take 1 tablet (4 mg total) by mouth every 8 (eight) hours as needed for nausea or vomiting., Disp: 20 tablet, Rfl: 0   pantoprazole (PROTONIX) 40 MG tablet, Take 40 mg by mouth daily., Disp: , Rfl:    potassium chloride SA (KLOR-CON) 20 MEQ tablet, Take 1 tablet (20 mEq total) by mouth daily., Disp: 30 tablet, Rfl: 3   promethazine (PHENERGAN) 25 MG tablet, Take 25 mg by mouth every 6 (six) hours as needed., Disp: , Rfl:    rosuvastatin (CRESTOR) 20 MG tablet, Take 1 tablet (20 mg total) by mouth daily at 6 PM., Disp: 30 tablet, Rfl: 0    sacubitril-valsartan (ENTRESTO) 97-103 MG, Take 1 tablet by mouth 2 (two) times daily., Disp: 180 tablet, Rfl: 3   topiramate (TOPAMAX) 100 MG tablet, Take 200 mg by mouth at bedtime. , Disp: , Rfl:    TRUEPLUS LANCETS 33G MISC, USE TO CHECK BLOOD GLUCOSE QID - AFTER MEALS AND AT BEDTIME, Disp: , Rfl: 3   TRULICITY 1.61 WR/6.0AV SOPN, Inject 0.75 mg into the skin once a week., Disp: , Rfl:    VENTOLIN HFA 108 (90 BASE) MCG/ACT inhaler, Use as directed 2 puffs in the mouth or throat 4 (four) times daily as needed for shortness of breath. 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 hauler    Diabetes mellitus without complication ([place])    Hypertension    Migraines    Obesity    Pulmonary embolism ([place]) 05/2019   with COVID   Sleep apnea      Past Surgical History:  Procedure Laterality Date   BILATERAL CARPAL TUNNEL RELEASE     TEE WITHOUT CARDIOVERSION N/A 03/28/2020   Procedure: TRANSESOPHAGEAL ECHOCARDIOGRAM (TEE);  Surgeon: Adrian Prows, MD;  Location: [facility] ENDOSCOPY;  Service: Cardiovascular;  Laterality: N/A;    Family History  Problem Relation Age of Onset   Hypertension Mother    Thyroid disease Mother    Hypertension Father    Stroke Father 56   Diabetes Father    Sleep apnea Father    CVA Paternal Uncle 74   Heart failure Paternal Grandfather     Social History   Tobacco Use   Smoking status: Never   Smokeless tobacco: Never  Vaping Use   Vaping Use: Never used  Substance Use Topics   Alcohol use: Yes    Alcohol/week: 0.0 standard drinks    Comment: occasional    Drug use: No    ROS   Objective:   Vitals: BP (!) 161/96 (BP Location: Left Arm)   Pulse 83   Temp 98.5 F (36.9 C) (Oral)   Resp 18   SpO2 98%   Physical Exam Constitutional:      General: She is not in acute distress.     Appearance: Normal appearance. She is well-developed. She is not ill-appearing.  HENT:     Head: Normocephalic and atraumatic.     Nose: Nose normal.     Mouth/Throat:     Mouth: Mucous membranes are moist.     Pharynx: Oropharynx is clear.  Eyes:     General: No scleral icterus.    Extraocular Movements: Extraocular movements  intact.     Pupils: Pupils are equal, round, and reactive to light.  Cardiovascular:     Rate and Rhythm: Normal rate.  Pulmonary:     Effort: Pulmonary effort is normal.  Musculoskeletal:       Feet:  Skin:    General: Skin is warm and dry.  Neurological:     General: No focal deficit present.     Mental Status: She is alert and oriented to person, place, and time.  Psychiatric:        Mood and Affect: Mood normal.        Behavior: Behavior normal.    DG Foot Complete Left  Result Date: 12/21/2020 CLINICAL DATA:  Left foot pain since yesterday, no reported injury EXAM: LEFT FOOT - COMPLETE 3+ VIEW COMPARISON:  None. FINDINGS: No fracture or dislocation. Tiny Achilles and plantar left calcaneal spurs. Mild degenerative changes in the dorsal tarsal metatarsal joints. No suspicious focal osseous lesions. No radiopaque foreign bodies. IMPRESSION: 1. No acute osseous abnormality. 2. Mild degenerative changes in the dorsal tarsometatarsal joints. 3. Tiny Achilles and plantar left calcaneal spurs. Electronically Signed   By: Ilona Sorrel M.D.   On: 12/21/2020 16:57     Assessment and Plan :   I have reviewed the PDMP during this encounter.  1. 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 History of pulmonary embolism     Will manage for inflammatory type pain such as plantar fasciitis with prednisone.  Recommended tramadol for breakthrough pain.  Follow-up with a podiatrist as soon as possible. Counseled patient on potential for adverse effects with medications prescribed/recommended today, ER and return-to-clinic precautions discussed,  patient verbalized understanding.    Jaynee Eagles, [place] 12/21/20 1744

## 2020-12-21 NOTE — Discharge Instructions (Addendum)
[facility] ([place]) Podiatrist in [place], [place] COVID-19 info: [email or web address] Get online care: [email or web address] Address: [address], [place], [place] [address] Phone: [phone] Appointments: [email or web address]   [facility], [place], [place] Doctor in [place], [place] Address: [address], [place], [place] [address] Phone: [phone]  

## 2021-01-16 ENCOUNTER — Other Ambulatory Visit: Payer: Self-pay | Admitting: Student

## 2021-01-16 ENCOUNTER — Other Ambulatory Visit: Payer: Self-pay

## 2021-01-16 ENCOUNTER — Telehealth: Payer: Self-pay | Admitting: Student

## 2021-01-16 DIAGNOSIS — I5022 Chronic systolic (congestive) heart failure: Secondary | ICD-10-CM

## 2021-01-16 DIAGNOSIS — I420 Dilated cardiomyopathy: Secondary | ICD-10-CM

## 2021-01-16 MED ORDER — POTASSIUM CHLORIDE CRYS ER 10 MEQ PO TBCR: 10.0000 meq | EXTENDED_RELEASE_TABLET | Freq: Two times a day (BID) | ORAL | 1 refills | Status: DC

## 2021-01-16 MED ORDER — CARVEDILOL 25 MG PO TABS: 50.0000 mg | ORAL_TABLET | Freq: Two times a day (BID) | ORAL | 3 refills | Status: DC

## 2021-01-16 NOTE — Telephone Encounter (Signed)
Patient was choking on it, hard to swallow. She cannot cut it because it is not scored and crumbles when she tries cutting them. That is the only reason why she would like to take the 10mg  2x day.

## 2021-01-16 NOTE — Telephone Encounter (Signed)
That is fine by me. Please send new prescription. Still 2o daily but can just take two 10 tablets.

## 2021-01-16 NOTE — Telephone Encounter (Signed)
Please advise 

## 2021-01-16 NOTE — Telephone Encounter (Signed)
Please figure out what issues she is having

## 2021-01-16 NOTE — Telephone Encounter (Signed)
Pt wants to know if she can have her potassium changed from 20mg  to 10mg . Says she is having issues w/20 mg dose.

## 2021-01-16 NOTE — Telephone Encounter (Signed)
Medication sent.  Patient aware  

## 2021-03-05 ENCOUNTER — Other Ambulatory Visit: Payer: Self-pay

## 2021-03-05 ENCOUNTER — Encounter: Payer: Self-pay | Admitting: Student

## 2021-03-05 ENCOUNTER — Ambulatory Visit: Payer: 59 | Admitting: Student

## 2021-03-05 VITALS — BP 141/74 | HR 66 | Temp 97.7°F | Ht 61.0 in | Wt 234.0 lb

## 2021-03-05 DIAGNOSIS — G4733 Obstructive sleep apnea (adult) (pediatric): Secondary | ICD-10-CM

## 2021-03-05 DIAGNOSIS — I420 Dilated cardiomyopathy: Secondary | ICD-10-CM

## 2021-03-05 DIAGNOSIS — I5022 Chronic systolic (congestive) heart failure: Secondary | ICD-10-CM

## 2021-03-05 MED ORDER — FUROSEMIDE 40 MG PO TABS: 40.0000 mg | ORAL_TABLET | Freq: Every day | ORAL | 3 refills | Status: DC | PRN

## 2021-03-05 MED ORDER — CARVEDILOL 25 MG PO TABS: 50.0000 mg | ORAL_TABLET | Freq: Two times a day (BID) | ORAL | 3 refills | Status: DC

## 2021-03-05 NOTE — Patient Instructions (Signed)

## 2021-03-05 NOTE — Progress Notes (Signed)
Primary Physician/Referring:  Shelly Berger, MD  Patient ID: Shelly Shields, female    DOB: Mar 29, 1981, 40 y.o.   MRN: [id number]  Chief Complaint  Patient presents with   Chronic systolic heart failure    Follow-up    4 month   HPI:    Shelly Shields  is a 40 y.o. African-American female with hypertension, hyperlipidemia, uncontrolled diabetes mellitus, obesity and obstructive sleep apnea on CPAP and history of Covid pneumonia in January 2021 and also had pulmonary embolism during Covid infection.  She is also being evaluated for vasculitis. 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  Patient underwent nuclear stress test which revealed large medium intensity fixed perfusion defect, suggestive of ischemic etiology for cardiomyopathy.    Patient presents for 41-month follow-up.  At last office visit she had again opted out of left heart catheterization despite abnormal stress test.  However patient states she is now open to proceeding with undergoing left heart catheterization.  Since last office visit she has gained 6 pounds and admits to high sodium intake and poor diet compliance.  She is presently taking Lasix 40 mg every day when she works as she notices lower extremity edema by the end of her shift, however she is not consistently wearing compression socks.  She also has not consistently on CPAP at this time.  Denies chest pain, palpitations, orthopnea, PND, syncope, near syncope, dizziness.  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  Heart rate is well controlled with up titration of carvedilol, however her blood pressure is mildly elevated in the office today.  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 hauler    Diabetes mellitus without complication ([place])    Hypertension    Migraines    Obesity     Pulmonary embolism ([place]) 05/2019   with COVID   Sleep apnea    Past Surgical History:  Procedure Laterality Date   BILATERAL CARPAL TUNNEL RELEASE     TEE WITHOUT CARDIOVERSION N/A 03/28/2020   Procedure: TRANSESOPHAGEAL ECHOCARDIOGRAM (TEE);  Surgeon: Adrian Prows, MD;  Location: [facility] ENDOSCOPY;  Service: Cardiovascular;  Laterality: N/A;   Family History  Problem Relation Age of Onset   Hypertension Mother    Thyroid disease Mother    Hypertension Father    Stroke Father 63   Diabetes Father    Sleep apnea Father    CVA Paternal Uncle 57   Heart failure Paternal Grandfather     Social History   Tobacco Use   Smoking status: Never   Smokeless tobacco: Never  Substance Use Topics   Alcohol use: Yes    Alcohol/week: 0.0 standard drinks    Comment: occasional    Marital Status: Single   ROS  Review of Systems  Constitutional: Negative for malaise/fatigue and weight gain.  Cardiovascular:  Positive for dyspnea on exertion (mild) and leg swelling (intermittent ). Negative for chest pain, claudication, near-syncope, orthopnea, palpitations, paroxysmal nocturnal dyspnea and syncope.  Respiratory:  Negative for shortness of breath.   Hematologic/Lymphatic: Does not bruise/bleed easily.  Gastrointestinal:  Negative for melena.  Neurological:  Negative for dizziness and weakness.   Objective  Blood pressure (!) 141/74, pulse 66, temperature 97.7 F (36.5 C), height $RemoveBe'5\' 1"'rmSqsrTgs$  (1.549 m), weight 234 lb (106.1 kg), SpO2 96 %.  Vitals with BMI 03/05/2021 12/21/2020 11/03/2020  Height '5\' 1"'$  - $'5\' 1"'m$   Weight 234 lbs - 228 lbs  BMI 17.40 - 81.4  Systolic 481 856 314  Diastolic 74 96 72  Pulse 66 83 87      Physical Exam Vitals reviewed.  Constitutional:      Appearance: She is obese.  HENT:     Head: Normocephalic and atraumatic.  Cardiovascular:     Rate and Rhythm: Normal rate and regular rhythm.     Pulses: Intact distal pulses.     Heart sounds: S1 normal and S2 normal. No  murmur heard.   No gallop.     Comments: No JVD.  Pulmonary:     Effort: Pulmonary effort is normal. No respiratory distress.     Breath sounds: No wheezing, rhonchi or rales.  Musculoskeletal:     Right lower leg: Edema (trace) present.     Left lower leg: Edema (trace) present.  Skin:    General: Skin is warm and dry.     Comments: Wounds on anterior shins bilaterally as noted in pictures below.  Neurological:     Mental Status: She is alert.     Cranial Nerves: No cranial nerve deficit.     Gait: Gait (Essentially normal) normal.  Right shin Left shin   Laboratory examination:   Recent Labs    05/22/20 1624 06/07/20 1545 07/07/20 1559  NA 141 143 142  K 3.6 4.0 3.9  CL 111* 110* 113*  CO2 17* 21 16*  GLUCOSE 251* 116* 116*  BUN $Re'11 12 10  'Utg$ CREATININE 0.95 0.96 0.95  CALCIUM 8.8 9.2 9.0  GFRNONAA 75 74 75  GFRAA 87 86 87   CrCl cannot be calculated (Patient's most recent lab result is older than the maximum 21 days allowed.).  CMP Latest Ref Rng & Units 07/07/2020 06/07/2020 05/22/2020  Glucose 65 - 99 mg/dL 116(H) 116(H) 251(H)  BUN 6 - 24 mg/dL $Remove'10 12 11  'XBLMJKK$ Creatinine 0.57 - 1.00 mg/dL 0.95 0.96 0.95  Sodium 134 - 144 mmol/L 142 143 141  Potassium 3.5 - 5.2 mmol/L 3.9 4.0 3.6  Chloride 96 - 106 mmol/L 113(H) 110(H) 111(H)  CO2 20 - 29 mmol/L 16(L) 21 17(L)  Calcium 8.7 - 10.2 mg/dL 9.0 9.2 8.8  Total Protein 6.5 - 8.1 g/dL - - -  Total Bilirubin 0.3 - 1.2 mg/dL - - -  Alkaline Phos 38 - 126 U/L - - -  AST 15 - 41 U/L - - -  ALT 0 - 44 U/L - - -   CBC Latest Ref Rng & Units 03/29/2020 03/27/2020 03/27/2020  WBC 4.0 - 10.5 K/uL 10.4 - 11.7(H)  Hemoglobin 12.0 - 15.0 g/dL 12.7 12.9 12.8  Hematocrit 36.0 - 46.0 % 38.3 38.0 39.3  Platelets 150 - 400 K/uL 245 - 195    Lipid Panel Recent Labs    03/28/20 0415  CHOL 176  TRIG 106  LDLCALC 126*  VLDL 21  HDL 29*  CHOLHDL 6.1    HEMOGLOBIN A1C Lab Results  Component Value Date   HGBA1C 11.2 (H) 03/27/2020    MPG 274.74 03/27/2020   TSH Recent Labs    03/28/20 0415  TSH 3.896   External labs:  None  Allergies   Allergies  Allergen Reactions   Lisinopril Cough   Metformin Diarrhea   Ibuprofen Hives   Ketorolac Hives    Medications Prior to Visit:   Outpatient Medications Prior to Visit  Medication Sig Dispense Refill   apixaban (  ELIQUIS) 5 MG TABS tablet Take 1 tablet (5 mg total) by mouth 2 (two) times daily. 60 tablet 0   Baclofen 5 MG TABS TAKE 1 TABLET BY MOUTH THREE TIMES DAILY AS NEEDED FOR HEADACHE OR NECK PAIN     butalbital-acetaminophen-caffeine (FIORICET) 50-325-40 MG tablet Take 1 tablet by mouth every 6 (six) hours as needed for headache. 20 tablet 0   Continuous Blood Gluc Sensor (DEXCOM G6 SENSOR) MISC SMARTSIG:1 Topical Every 10 Days     Continuous Blood Gluc Transmit (DEXCOM G6 TRANSMITTER) MISC See admin instructions.     escitalopram (LEXAPRO) 10 MG tablet Take 10 mg by mouth daily.     fexofenadine (ALLEGRA) 180 MG tablet Take 180 mg by mouth daily as needed.      fluticasone (FLONASE) 50 MCG/ACT nasal spray Place 2 sprays into both nostrils daily as needed for allergies.   3   fluticasone (FLOVENT HFA) 110 MCG/ACT inhaler Inhale 1 puff into the lungs daily.      Insulin Pen Needle (INSUPEN PEN NEEDLES) 32G X 4 MM MISC BD Pen Needles- brand specific. Inject insulin via insulin pen daily 200 each 3   ivabradine (CORLANOR) 5 MG TABS tablet Take 1 tablet (5 mg total) by mouth 2 (two) times daily with a meal. 90 tablet 3   Lancet Devices (ACCU-CHEK SOFTCLIX) lancets Use as instructed for blood glucose checks four times daily, before meals and at bedtime 100 each 5   medroxyPROGESTERone (DEPO-PROVERA) 150 MG/ML injection Inject 150 mg into the muscle every 3 (three) months.     montelukast (SINGULAIR) 10 MG tablet Take 10 mg by mouth at bedtime.     nortriptyline (PAMELOR) 10 MG capsule Take 10 mg by mouth at bedtime.     ondansetron (ZOFRAN) 4 MG tablet Take 1  tablet (4 mg total) by mouth every 8 (eight) hours as needed for nausea or vomiting. 20 tablet 0   pantoprazole (PROTONIX) 40 MG tablet Take 40 mg by mouth daily.     potassium chloride SA (KLOR-CON) 10 MEQ tablet Take 1 tablet (10 mEq total) by mouth 2 (two) times daily. 180 tablet 1   promethazine (PHENERGAN) 25 MG tablet Take 25 mg by mouth every 6 (six) hours as needed.     rosuvastatin (CRESTOR) 20 MG tablet Take 1 tablet (20 mg total) by mouth daily at 6 PM. 30 tablet 0   sacubitril-valsartan (ENTRESTO) 97-103 MG Take 1 tablet by mouth 2 (two) times daily. 180 tablet 3   topiramate (TOPAMAX) 100 MG tablet Take 200 mg by mouth at bedtime.      TRUEPLUS LANCETS 33G MISC USE TO CHECK BLOOD GLUCOSE QID - AFTER MEALS AND AT BEDTIME  3   TRULICITY 8.31 DV/7.6HY SOPN Inject 0.75 mg into the skin once a week.     VENTOLIN HFA 108 (90 BASE) MCG/ACT inhaler Use as directed 2 puffs in the mouth or throat 4 (four) times daily as needed for shortness of breath.   3   carvedilol (COREG) 25 MG tablet Take 2 tablets (50 mg total) by mouth 2 (two) times daily with a meal. 180 tablet 3   furosemide (LASIX) 40 MG tablet Take 1 tablet (40 mg total) by mouth daily as needed for fluid or edema. 30 tablet 3   traMADol (ULTRAM) 50 MG tablet Take 1 tablet (50 mg total) by mouth every 6 (six) hours as needed for severe pain. 15 tablet 0   Blood Glucose Monitoring Suppl (ACCU-CHEK NANO SMARTVIEW)  W/DEVICE KIT 1 Device by Does not apply route 4 (four) times daily - after meals and at bedtime. 1 kit 0   glucose blood (ACCU-CHEK SMARTVIEW) test strip Check sugar 6 x daily (Patient taking differently: Check sugar 6 x daily. Uses true test instead) 200 each 3   Insulin Disposable Pump (OMNIPOD DASH PODS, GEN 4,) MISC SMARTSIG:SUB-Q Every Other Day     insulin glargine (LANTUS) 100 UNIT/ML Solostar Pen Inject 80 Units into the skin 2 (two) times daily.     insulin lispro (HUMALOG) 100 UNIT/ML injection Inject into the skin.  Sliding scale     predniSONE (DELTASONE) 20 MG tablet Take 1 tablet (20 mg total) by mouth daily with breakfast. (Patient not taking: Reported on 03/05/2021) 5 tablet 0   No facility-administered medications prior to visit.   Final Medications at End of Visit    Current Meds  Medication Sig   apixaban (ELIQUIS) 5 MG TABS tablet Take 1 tablet (5 mg total) by mouth 2 (two) times daily.   Baclofen 5 MG TABS TAKE 1 TABLET BY MOUTH THREE TIMES DAILY AS NEEDED FOR HEADACHE OR NECK PAIN   butalbital-acetaminophen-caffeine (FIORICET) 50-325-40 MG tablet Take 1 tablet by mouth every 6 (six) hours as needed for headache.   Continuous Blood Gluc Sensor (DEXCOM G6 SENSOR) MISC SMARTSIG:1 Topical Every 10 Days   Continuous Blood Gluc Transmit (DEXCOM G6 TRANSMITTER) MISC See admin instructions.   escitalopram (LEXAPRO) 10 MG tablet Take 10 mg by mouth daily.   fexofenadine (ALLEGRA) 180 MG tablet Take 180 mg by mouth daily as needed.    fluticasone (FLONASE) 50 MCG/ACT nasal spray Place 2 sprays into both nostrils daily as needed for allergies.    fluticasone (FLOVENT HFA) 110 MCG/ACT inhaler Inhale 1 puff into the lungs daily.    Insulin Pen Needle (INSUPEN PEN NEEDLES) 32G X 4 MM MISC BD Pen Needles- brand specific. Inject insulin via insulin pen daily   ivabradine (CORLANOR) 5 MG TABS tablet Take 1 tablet (5 mg total) by mouth 2 (two) times daily with a meal.   Lancet Devices (ACCU-CHEK SOFTCLIX) lancets Use as instructed for blood glucose checks four times daily, before meals and at bedtime   medroxyPROGESTERone (DEPO-PROVERA) 150 MG/ML injection Inject 150 mg into the muscle every 3 (three) months.   montelukast (SINGULAIR) 10 MG tablet Take 10 mg by mouth at bedtime.   nortriptyline (PAMELOR) 10 MG capsule Take 10 mg by mouth at bedtime.   ondansetron (ZOFRAN) 4 MG tablet Take 1 tablet (4 mg total) by mouth every 8 (eight) hours as needed for nausea or vomiting.   pantoprazole (PROTONIX) 40 MG  tablet Take 40 mg by mouth daily.   potassium chloride SA (KLOR-CON) 10 MEQ tablet Take 1 tablet (10 mEq total) by mouth 2 (two) times daily.   promethazine (PHENERGAN) 25 MG tablet Take 25 mg by mouth every 6 (six) hours as needed.   rosuvastatin (CRESTOR) 20 MG tablet Take 1 tablet (20 mg total) by mouth daily at 6 PM.   sacubitril-valsartan (ENTRESTO) 97-103 MG Take 1 tablet by mouth 2 (two) times daily.   topiramate (TOPAMAX) 100 MG tablet Take 200 mg by mouth at bedtime.    TRUEPLUS LANCETS 33G MISC USE TO CHECK BLOOD GLUCOSE QID - AFTER MEALS AND AT BEDTIME   TRULICITY 8.91 QX/4.5WT SOPN Inject 0.75 mg into the skin once a week.   VENTOLIN HFA 108 (90 BASE) MCG/ACT inhaler Use as directed 2 puffs in the  mouth or throat 4 (four) times daily as needed for shortness of breath.    [DISCONTINUED] carvedilol (COREG) 25 MG tablet Take 2 tablets (50 mg total) by mouth 2 (two) times daily with a meal.   [DISCONTINUED] furosemide (LASIX) 40 MG tablet Take 1 tablet (40 mg total) by mouth daily as needed for fluid or edema.   [DISCONTINUED] traMADol (ULTRAM) 50 MG tablet Take 1 tablet (50 mg total) by mouth every 6 (six) hours as needed for severe pain.   Radiology:   No results found. CT Code Stroke CTA Head W/WO contrast 03/27/2020 1. Negative for large vessel occlusion but positive for Right MCA M3 occlusion. CT Perfusion detects core infarct with minimally larger penumbra at the right insula/operculum corresponding to the plain CT finding.  2. Additionally, mild vessel irregularity is noted in multiple other circle-of-Willis branches, including the left A1, right P2, left M2. Although nonspecific this constellation of clinical and imaging findings might indicate accelerated branch vessel atherosclerosis. Although other large vessels appear normal, with no atherosclerosis in the neck or at the aortic arch.   MR ANGIO HEAD WO CONTRAST  03/27/2020  1. Moderate-sized acute right MCA infarct. 2. Small  subacute left parieto-occipital infarcts. 3. Right M3 branch occlusion, otherwise negative head [id number].0200   CT ABDOMEN PELVIS W CONTRAST  03/27/2020  1. New 18 mm hypoattenuating lesion upper pole right kidney with areas of subcapsular multifocal decreased enhancement in the lower pole right kidney. Imaging features are nonspecific and could be related to pyelonephritis and phlegmon/evolving abscess in the upper pole right kidney cannot be excluded. Alternatively, multifocal right renal infarct could have this appearance. Correlation with urinalysis may prove helpful.  2. Relatively well-defined 2.2 cm lesion in the inferior lingula measures water density. This may be loculated pleural fluid or intraparenchymal fluid collection in this patient with a history of pulmonary embolus and bilateral ground-glass airspace disease on the previous study suspicious for multifocal atypical/viral pneumonia. Consider follow-up to ensure resolution.  Cardiac Studies:   PCV MYOCARDIAL PERFUSION WITH LEXISCAN 08/14/2020 Lexiscan nuclear stress test performed using 1-day protocol. Left ventricle is dilated. Severe decrease in global myocardial thickening and wall motion. Stress LVEF <20%. Large sized, medium intensity, fixed perfusion defect in anteroapical, apical, inferior, inferoseptal myocardium. High risk study  PCV ECHOCARDIOGRAM COMPLETE 07/25/2020 Severely depressed LV systolic function with visual EF 25-30%. Hypokinetic global wall motion. Left ventricle cavity is dilated. Mild left ventricular hypertrophy. Doppler evidence of grade I (impaired) diastolic dysfunction, elevated LAP. Mild (Grade I) mitral regurgitation. Prior study dated 03/27/2020: LVEF 20-25%, regional wall motion abnormalities, grade 2 diastolic dysfunction, moderately dilated left atrium, trivial MR. Recommendations: May consider limited echo to reevaluate LVEF with Definity or consider MUGA scan to better evaluate LVEF.   LONG TERM  MONITOR (8-14 DAYS) 06/22/20-07/06/20:  Predominant underlying rhythm was sinus with minimum heart rate of 71 bpm, maximum heart rate of 193 bpm average heart rate of 91 bpm.  There were no patient triggered events.  PACs and PVCs were rare.  Patient with intermittent right bundle branch block.  2 episodes of supraventricular tachycardia with the longest lasting 12 seconds and a maximum heart rate of 93 bpm.  No evidence of atrial fibrillation.  No ventricular tachycardia, high degree AV block, pauses >3 seconds.  ECHOCARDIOGRAM COMPLETE BUBBLE STUDY  03/27/2020 1. Left ventricular ejection fraction, by estimation, is 20 to 25%. The left ventricle has severely decreased function. There is severe hypokinesis of the basal-to-apical anteroseptum, inferoseptum and anterior LV  walls . Left ventricular diastolic  parameters are consistent with Grade II diastolic dysfunction (pseudonormalization).  2. Right ventricular systolic function is normal. The right ventricular size is normal.  3. Left atrial size was moderately dilated.  4. The mitral valve is normal in structure. Trivial mitral valve regurgitation.  5. The aortic valve is tricuspid. There is mild thickening of the aortic valve. Aortic valve regurgitation is not visualized.  6. The inferior vena cava is normal in size with greater than 50% respiratory variability, suggesting right atrial pressure of 3 mmHg.  7. Agitated saline contrast bubble study was negative, with no evidence of any interatrial shunt.   Comparison(s): Compared to prior echo in 06/2019, the LVEF is now severely reduced to 20-25% with severe hypokinesis of the inferoseptal, anteroseptal and anterior LV walls.  EKG:  03/05/2021: Sinus rhythm at a rate of 62 bpm, new left bundle branch block, no further analysis.  EKG 04/18/2020: Sinus rhythm at a rate of 90 bpm. Left axis deviation. PRWP, cannot exclude anteroseptal infarct old. Non-specific T wave abnormality.   EKG 03/27/2020:  Sinus tachycardia at rate of 102 bpm, normal axis, borderline criteria for LVH.  Borderline QT elongation.  Compared to 06/30/2019, sinus tachycardia new.  No change in QT elongation.  Assessment     ICD-10-CM   1. Chronic systolic heart failure (HCC)  I50.22 EKG 12-Lead    carvedilol (COREG) 25 MG tablet    Basic metabolic panel    CBC    PCV ECHOCARDIOGRAM COMPLETE    2. Dilated cardiomyopathy (HCC)  I42.0 carvedilol (COREG) 25 MG tablet    3. OSA (obstructive sleep apnea)  G47.33 Ambulatory referral to Sleep Studies       Medications Discontinued During This Encounter  Medication Reason   insulin glargine (LANTUS) 100 UNIT/ML Solostar Pen Error   insulin lispro (HUMALOG) 100 UNIT/ML injection Error   predniSONE (DELTASONE) 20 MG tablet Error   traMADol (ULTRAM) 50 MG tablet Error   furosemide (LASIX) 40 MG tablet Reorder   carvedilol (COREG) 25 MG tablet Reorder    Meds ordered this encounter  Medications   carvedilol (COREG) 25 MG tablet    Sig: Take 2 tablets (50 mg total) by mouth 2 (two) times daily with a meal.    Dispense:  180 tablet    Refill:  3   furosemide (LASIX) 40 MG tablet    Sig: Take 1 tablet (40 mg total) by mouth daily as needed for fluid or edema.    Dispense:  90 tablet    Refill:  3    Recommendations:   Shelly Shields is a 40 y.o. African-American female with hypertension, hyperlipidemia, uncontrolled diabetes mellitus, obesity and obstructive sleep apnea and history of Covid pneumonia in January 2021 and also had pulmonary embolism during Covid infection.  She is also being evaluated for vasculitis. 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 Patient underwent nuclear stress test which revealed large medium intensity fixed perfusion defect, suggestive of ischemic etiology for cardiomyopathy.    Patient is feeling relatively well overall although she  admits to recent dietary noncompliance and weight gain, which is likely contributing to increased blood pressure.  Discussed with patient option of initiating spironolactone given heart failure and elevated blood pressure, however patient prefers to hold off at this time and would first like to focus on making diet and lifestyle modifications including weight loss.  EKG today  shows new left bundle branch block.  Patient previously opted to hold off on cardiac catheterization despite heart failure and abnormal stress test findings.  However she is not open to proceeding with cardiac catheterization.  Given new left bundle branch block we will schedule this as soon as able in an outpatient basis as she is presently without symptoms.  The left heart catheterization procedure was explained to the patient in detail. The indication, alternatives, risks and benefits were reviewed. Complications including but not limited to bleeding, infection, acute kidney injury, blood transfusion, heart rhythm disturbances, contrast (dye) reaction, damage to the arteries or nerves in the legs or hands, cerebrovascular accident, myocardial infarction, need for emergent bypass surgery, blood clots in the legs, possible need for emergent blood transfusion, and rarely death were reviewed and discussed with the patient. The patient voices understanding and wishes to proceed.  Patient's heart rate is under better control today.  We will plan for repeat echocardiogram following cardiac catheterization.  Regarding patient's bilateral shin wounds she has follow-up appointment with PCP recommend she follow closely with them for further management.  Patient also reports she has not been compliant with CPAP lately and is requesting referral for reevaluation of sleep apnea.  No changes are made to patient's medications at this time.  She continues to tolerate anticoagulation without bleeding diathesis.  Follow-up 1 week after cardiac  catheterization.  Patient was seen in collaboration with Dr. Einar Gip and he is in agreement with the plan.   Alethia Berthold, PA-C 03/06/2021, 11:41 AM Office: [phone]

## 2021-03-15 DIAGNOSIS — Z0289 Encounter for other administrative examinations: Secondary | ICD-10-CM

## 2021-03-20 LAB — BASIC METABOLIC PANEL
BUN/Creatinine Ratio: 10 (ref 9–23)
BUN: 10 mg/dL (ref 6–24)
CO2: 18 mmol/L — ABNORMAL LOW (ref 20–29)
Calcium: 9 mg/dL (ref 8.7–10.2)
Chloride: 114 mmol/L — ABNORMAL HIGH (ref 96–106)
Creatinine, Ser: 1.01 mg/dL — ABNORMAL HIGH (ref 0.57–1.00)
Glucose: 109 mg/dL — ABNORMAL HIGH (ref 70–99)
Potassium: 3.7 mmol/L (ref 3.5–5.2)
Sodium: 143 mmol/L (ref 134–144)
eGFR: 72 mL/min/{1.73_m2} (ref >59)

## 2021-03-20 LAB — CBC
Hematocrit: 41.1 % (ref 34.0–46.6)
Hemoglobin: 13.3 g/dL (ref 11.1–15.9)
MCH: 28.7 pg (ref 26.6–33.0)
MCHC: 32.4 g/dL (ref 31.5–35.7)
MCV: 89 fL (ref 79–97)
Platelets: 276 10*3/uL (ref 150–450)
RBC: 4.63 x10E6/uL (ref 3.77–5.28)
RDW: 13.9 % (ref 11.7–15.4)
WBC: 8.3 10*3/uL (ref 3.4–10.8)

## 2021-03-26 DIAGNOSIS — I5022 Chronic systolic (congestive) heart failure: Secondary | ICD-10-CM

## 2021-03-26 HISTORY — DX: Chronic systolic (congestive) heart failure: I50.22

## 2021-03-27 ENCOUNTER — Other Ambulatory Visit: Payer: Self-pay

## 2021-03-27 ENCOUNTER — Encounter (HOSPITAL_COMMUNITY)
Admission: RE | Disposition: A | Discharge status: Home / Self Care | Payer: Self-pay | Source: Ambulatory Visit | Attending: Cardiology

## 2021-03-27 ENCOUNTER — Ambulatory Visit (HOSPITAL_COMMUNITY)
Admission: RE | Admit: 2021-03-27 | Discharge: 2021-03-27 | Disposition: A | Discharge status: Home / Self Care | Payer: 59 | Source: Ambulatory Visit | Attending: Cardiology | Admitting: Cardiology

## 2021-03-27 DIAGNOSIS — Z79899 Other long term (current) drug therapy: Secondary | ICD-10-CM | POA: Diagnosis not present

## 2021-03-27 DIAGNOSIS — Z8616 Personal history of COVID-19: Secondary | ICD-10-CM | POA: Diagnosis not present

## 2021-03-27 DIAGNOSIS — I110 Hypertensive heart disease with heart failure: Secondary | ICD-10-CM | POA: Insufficient documentation

## 2021-03-27 DIAGNOSIS — Z7985 Long-term (current) use of injectable non-insulin antidiabetic drugs: Secondary | ICD-10-CM | POA: Insufficient documentation

## 2021-03-27 DIAGNOSIS — E119 Type 2 diabetes mellitus without complications: Secondary | ICD-10-CM | POA: Diagnosis not present

## 2021-03-27 DIAGNOSIS — Z794 Long term (current) use of insulin: Secondary | ICD-10-CM | POA: Diagnosis not present

## 2021-03-27 DIAGNOSIS — Z6841 Body Mass Index (BMI) 40.0 and over, adult: Secondary | ICD-10-CM | POA: Insufficient documentation

## 2021-03-27 DIAGNOSIS — E669 Obesity, unspecified: Secondary | ICD-10-CM | POA: Insufficient documentation

## 2021-03-27 DIAGNOSIS — Z888 Allergy status to other drugs, medicaments and biological substances status: Secondary | ICD-10-CM | POA: Insufficient documentation

## 2021-03-27 DIAGNOSIS — G4733 Obstructive sleep apnea (adult) (pediatric): Secondary | ICD-10-CM | POA: Diagnosis not present

## 2021-03-27 DIAGNOSIS — I5022 Chronic systolic (congestive) heart failure: Secondary | ICD-10-CM | POA: Insufficient documentation

## 2021-03-27 DIAGNOSIS — E785 Hyperlipidemia, unspecified: Secondary | ICD-10-CM | POA: Diagnosis not present

## 2021-03-27 DIAGNOSIS — Z7901 Long term (current) use of anticoagulants: Secondary | ICD-10-CM | POA: Diagnosis not present

## 2021-03-27 HISTORY — PX: LEFT HEART CATH AND CORONARY ANGIOGRAPHY: CATH118249

## 2021-03-27 LAB — PREGNANCY, URINE: Preg Test, Ur: NEGATIVE

## 2021-03-27 LAB — GLUCOSE, CAPILLARY: Glucose-Capillary: 157 mg/dL — ABNORMAL HIGH (ref 70–99)

## 2021-03-27 SURGERY — LEFT HEART CATH AND CORONARY ANGIOGRAPHY
Anesthesia: LOCAL

## 2021-03-27 MED ORDER — HEPARIN SODIUM (PORCINE) 1000 UNIT/ML IJ SOLN
INTRAMUSCULAR | Status: AC
  Filled 2021-03-27: qty 1

## 2021-03-27 MED ORDER — SODIUM CHLORIDE 0.9 % IV SOLN: INTRAVENOUS | Status: DC

## 2021-03-27 MED ORDER — ASPIRIN 81 MG PO CHEW
CHEWABLE_TABLET | ORAL | Status: AC
  Administered 2021-03-27: 81 mg
  Filled 2021-03-27: qty 1

## 2021-03-27 MED ORDER — HEPARIN (PORCINE) IN NACL 1000-0.9 UT/500ML-% IV SOLN
INTRAVENOUS | Status: AC
  Filled 2021-03-27: qty 1000

## 2021-03-27 MED ORDER — FENTANYL CITRATE (PF) 100 MCG/2ML IJ SOLN
INTRAMUSCULAR | Status: AC
  Filled 2021-03-27: qty 2

## 2021-03-27 MED ORDER — LIDOCAINE HCL (PF) 1 % IJ SOLN
INTRAMUSCULAR | Status: AC
  Filled 2021-03-27: qty 30

## 2021-03-27 MED ORDER — MORPHINE SULFATE (PF) 2 MG/ML IV SOLN
INTRAVENOUS | Status: DC | PRN
  Administered 2021-03-27: 2 mg via INTRAVENOUS

## 2021-03-27 MED ORDER — LABETALOL HCL 5 MG/ML IV SOLN: 10.0000 mg | INTRAVENOUS | Status: DC | PRN

## 2021-03-27 MED ORDER — SODIUM CHLORIDE 0.9% FLUSH: 3.0000 mL | INTRAVENOUS | Status: DC | PRN

## 2021-03-27 MED ORDER — SODIUM CHLORIDE 0.9% FLUSH: 3.0000 mL | Freq: Two times a day (BID) | INTRAVENOUS | Status: DC

## 2021-03-27 MED ORDER — MIDAZOLAM HCL 2 MG/2ML IJ SOLN
INTRAMUSCULAR | Status: DC | PRN
  Administered 2021-03-27: 1 mg via INTRAVENOUS

## 2021-03-27 MED ORDER — SODIUM CHLORIDE 0.9 % IV SOLN: 250.0000 mL | INTRAVENOUS | Status: DC | PRN

## 2021-03-27 MED ORDER — ACETAMINOPHEN 325 MG PO TABS: 650.0000 mg | ORAL_TABLET | ORAL | Status: DC | PRN

## 2021-03-27 MED ORDER — HYDRALAZINE HCL 20 MG/ML IJ SOLN: 10.0000 mg | INTRAMUSCULAR | Status: DC | PRN

## 2021-03-27 MED ORDER — VERAPAMIL HCL 2.5 MG/ML IV SOLN
INTRAVENOUS | Status: AC
  Filled 2021-03-27: qty 2

## 2021-03-27 MED ORDER — ONDANSETRON HCL 4 MG/2ML IJ SOLN: 4.0000 mg | Freq: Four times a day (QID) | INTRAMUSCULAR | Status: DC | PRN

## 2021-03-27 MED ORDER — LIDOCAINE HCL (PF) 1 % IJ SOLN
INTRAMUSCULAR | Status: DC | PRN
  Administered 2021-03-27: 2 mL via SUBCUTANEOUS

## 2021-03-27 MED ORDER — APIXABAN 5 MG PO TABS: 5.0000 mg | ORAL_TABLET | Freq: Two times a day (BID) | ORAL | 0 refills | Status: AC

## 2021-03-27 MED ORDER — MIDAZOLAM HCL 2 MG/2ML IJ SOLN
INTRAMUSCULAR | Status: AC
  Filled 2021-03-27: qty 2

## 2021-03-27 MED ORDER — MORPHINE SULFATE (PF) 2 MG/ML IV SOLN
INTRAVENOUS | Status: AC
  Filled 2021-03-27: qty 1

## 2021-03-27 MED ORDER — IOHEXOL 350 MG/ML SOLN
INTRAVENOUS | Status: DC | PRN
  Administered 2021-03-27: 30 mL via INTRA_ARTERIAL

## 2021-03-27 MED ORDER — HEPARIN SODIUM (PORCINE) 1000 UNIT/ML IJ SOLN
INTRAMUSCULAR | Status: DC | PRN
  Administered 2021-03-27: 5000 [IU] via INTRAVENOUS

## 2021-03-27 SURGICAL SUPPLY — 12 items
CATH INFINITI 5 FR 3DRC (CATHETERS) ×1 IMPLANT
CATH OPTITORQUE TIG 4.0 5F (CATHETERS) ×1 IMPLANT
DEVICE RAD COMP TR BAND LRG (VASCULAR PRODUCTS) ×1 IMPLANT
GLIDESHEATH SLEND A-KIT 6F 22G (SHEATH) ×1 IMPLANT
GUIDEWIRE INQWIRE 1.5J.035X260 (WIRE) IMPLANT
INQWIRE 1.5J .035X260CM (WIRE) ×2
KIT HEART LEFT (KITS) ×2 IMPLANT
PACK CARDIAC CATHETERIZATION (CUSTOM PROCEDURE TRAY) ×2 IMPLANT
SHEATH PROBE COVER 6X72 (BAG) ×1 IMPLANT
SYR MEDRAD MARK 7 150ML (SYRINGE) ×2 IMPLANT
TRANSDUCER W/STOPCOCK (MISCELLANEOUS) ×2 IMPLANT
TUBING CIL FLEX 10 FLL-RA (TUBING) ×2 IMPLANT

## 2021-03-27 NOTE — H&P (Signed)
OV 10/102022 copied for documentation  Primary Physician/Referring:  [name], MD  Patient ID: Shelly Shields, female    DOB: 1980-09-15, 40 y.o.   MRN: [id number]  No chief complaint on file.  HPI:    Shelly Shields  is a 40 y.o. African-American female with hypertension, hyperlipidemia, uncontrolled diabetes mellitus, obesity and obstructive sleep apnea on CPAP and history of Covid pneumonia in January 2021 and also had pulmonary embolism during Covid infection.  She is also being evaluated for vasculitis. 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  Patient underwent nuclear stress test which revealed large medium intensity fixed perfusion defect, suggestive of ischemic etiology for cardiomyopathy.    Patient presents for 46-month follow-up.  At last office visit she had again opted out of left heart catheterization despite abnormal stress test.  However patient states she is now open to proceeding with undergoing left heart catheterization.  Since last office visit she has gained 6 pounds and admits to high sodium intake and poor diet compliance.  She is presently taking Lasix 40 mg every day when she works as she notices lower extremity edema by the end of her shift, however she is not consistently wearing compression socks.  She also has not consistently on CPAP at this time.  Denies chest pain, palpitations, orthopnea, PND, syncope, near syncope, dizziness.  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  Heart rate is well controlled with up titration of carvedilol, however her blood pressure is mildly elevated in the office today.  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 hauler    Diabetes mellitus without complication ([place])    Hypertension    Migraines    Obesity    Pulmonary embolism ([place]) 05/2019    with COVID   Sleep apnea    Past Surgical History:  Procedure Laterality Date   BILATERAL CARPAL TUNNEL RELEASE     TEE WITHOUT CARDIOVERSION N/A 03/28/2020   Procedure: TRANSESOPHAGEAL ECHOCARDIOGRAM (TEE);  Surgeon: Adrian Prows, MD;  Location: [facility] ENDOSCOPY;  Service: Cardiovascular;  Laterality: N/A;   Family History  Problem Relation Age of Onset   Hypertension Mother    Thyroid disease Mother    Hypertension Father    Stroke Father 66   Diabetes Father    Sleep apnea Father    CVA Paternal Uncle 62   Heart failure Paternal Grandfather     Social History   Tobacco Use   Smoking status: Never   Smokeless tobacco: Never  Substance Use Topics   Alcohol use: Yes    Alcohol/week: 0.0 standard drinks    Comment: occasional    Marital Status: Single   ROS  Review of Systems  Constitutional: Negative for malaise/fatigue and weight gain.  Cardiovascular:  Positive for dyspnea on exertion (mild) and leg swelling (intermittent ). Negative for chest pain, claudication, near-syncope, orthopnea, palpitations, paroxysmal nocturnal dyspnea and syncope.  Respiratory:  Negative for shortness of breath.   Hematologic/Lymphatic: Does not bruise/bleed easily.  Gastrointestinal:  Negative for melena.  Neurological:  Negative for dizziness and weakness.   Objective  Blood pressure 121/74, pulse 72, temperature 98.6 F (37 C), temperature source Oral, resp. rate 18, height 5\' 1"  (1.549 m), weight 104.3 kg, SpO2 100 %.  Vitals with BMI 03/27/2021 03/05/2021 12/21/2020  Height 5\' 1"  5\' 1"  -  Weight 230 lbs 234 lbs -  BMI 0000000 99991111 -  Systolic 123XX123 Q000111Q Q000111Q  Diastolic 74 74 96  Pulse 72 66 83      Physical Exam Vitals reviewed.  Constitutional:      Appearance: She is obese.  HENT:     Head: Normocephalic and atraumatic.  Cardiovascular:     Rate and Rhythm: Normal rate and regular rhythm.     Pulses: Intact distal pulses.     Heart sounds: S1 normal and S2 normal. No murmur  heard.   No gallop.     Comments: No JVD.  Pulmonary:     Effort: Pulmonary effort is normal. No respiratory distress.     Breath sounds: No wheezing, rhonchi or rales.  Musculoskeletal:     Right lower leg: Edema (trace) present.     Left lower leg: Edema (trace) present.  Skin:    General: Skin is warm and dry.     Comments: Wounds on anterior shins bilaterally as noted in pictures below.  Neurological:     Mental Status: She is alert.     Cranial Nerves: No cranial nerve deficit.     Gait: Gait (Essentially normal) normal.  Right shin Left shin   Laboratory examination:   Recent Labs    05/22/20 1624 06/07/20 1545 07/07/20 1559 03/19/21 1405  NA 141 143 142 143  K 3.6 4.0 3.9 3.7  CL 111* 110* 113* 114*  CO2 17* 21 16* 18*  GLUCOSE 251* 116* 116* 109*  BUN 11 12 10 10   CREATININE 0.95 0.96 0.95 1.01*  CALCIUM 8.8 9.2 9.0 9.0  GFRNONAA 75 74 75  --   GFRAA 87 86 87  --     estimated creatinine clearance is 81.5 mL/min (A) (by C-G formula based on SCr of 1.01 mg/dL (H)).  CMP Latest Ref Rng & Units 03/19/2021 07/07/2020 06/07/2020  Glucose 70 - 99 mg/dL 109(H) 116(H) 116(H)  BUN 6 - 24 mg/dL 10 10 12   Creatinine 0.57 - 1.00 mg/dL 1.01(H) 0.95 0.96  Sodium 134 - 144 mmol/L 143 142 143  Potassium 3.5 - 5.2 mmol/L 3.7 3.9 4.0  Chloride 96 - 106 mmol/L 114(H) 113(H) 110(H)  CO2 20 - 29 mmol/L 18(L) 16(L) 21  Calcium 8.7 - 10.2 mg/dL 9.0 9.0 9.2  Total Protein 6.5 - 8.1 g/dL - - -  Total Bilirubin 0.3 - 1.2 mg/dL - - -  Alkaline Phos 38 - 126 U/L - - -  AST 15 - 41 U/L - - -  ALT 0 - 44 U/L - - -   CBC Latest Ref Rng & Units 03/19/2021 03/29/2020 03/27/2020  WBC 3.4 - 10.8 x10E3/uL 8.3 10.4 -  Hemoglobin 11.1 - 15.9 g/dL 13.3 12.7 12.9  Hematocrit 34.0 - 46.6 % 41.1 38.3 38.0  Platelets 150 - 450 x10E3/uL 276 245 -    Lipid Panel Recent Labs    03/28/20 0415  CHOL 176  TRIG 106  LDLCALC 126*  VLDL 21  HDL 29*  CHOLHDL 6.1     HEMOGLOBIN A1C Lab  Results  Component Value Date   HGBA1C 11.2 (H) 03/27/2020   MPG 274.74 03/27/2020   TSH Recent Labs    03/28/20 0415  TSH 3.896    External labs:  None  Allergies   Allergies  Allergen Reactions   Lisinopril Cough   Metformin Diarrhea   Ibuprofen Hives   Ketorolac Hives    Medications Prior to Visit:   (Not  in an outpatient encounter)  Final Medications at End of Visit    Current Meds  Medication Sig   apixaban (ELIQUIS) 5 MG TABS tablet Take 1 tablet (5 mg total) by mouth 2 (two) times daily.   Baclofen 5 MG TABS Take 5-10 mg by mouth daily as needed (Migraines).   butalbital-acetaminophen-caffeine (FIORICET) 50-325-40 MG tablet Take 1 tablet by mouth every 6 (six) hours as needed for headache.   carvedilol (COREG) 25 MG tablet Take 2 tablets (50 mg total) by mouth 2 (two) times daily with a meal.   Continuous Blood Gluc Sensor (DEXCOM G6 SENSOR) MISC SMARTSIG:1 Topical Every 10 Days   Continuous Blood Gluc Transmit (DEXCOM G6 TRANSMITTER) MISC See admin instructions.   Dulaglutide 1.5 MG/0.5ML SOPN Inject 1.5 mg into the skin once a week.   escitalopram (LEXAPRO) 10 MG tablet Take 10 mg by mouth daily.   fexofenadine (ALLEGRA) 180 MG tablet Take 180 mg by mouth daily.   fluticasone (FLONASE) 50 MCG/ACT nasal spray Place 2 sprays into both nostrils daily as needed for allergies.    furosemide (LASIX) 40 MG tablet Take 1 tablet (40 mg total) by mouth daily as needed for fluid or edema. (Patient taking differently: Take 40 mg by mouth daily.)   Insulin Disposable Pump (OMNIPOD DASH PODS, GEN 4,) MISC 200 Units every other day. Humalog   Insulin Infusion Pump (DEXCOM G6 BASAL-IQ INSULIN PMP) DEVI by Does not apply route.   ivabradine (CORLANOR) 5 MG TABS tablet Take 1 tablet (5 mg total) by mouth 2 (two) times daily with a meal.   montelukast (SINGULAIR) 10 MG tablet Take 10 mg by mouth at bedtime.   nortriptyline (PAMELOR) 10 MG capsule Take 20 mg by mouth at bedtime.    pantoprazole (PROTONIX) 40 MG tablet Take 40 mg by mouth daily.   potassium chloride SA (KLOR-CON) 10 MEQ tablet Take 1 tablet (10 mEq total) by mouth 2 (two) times daily. (Patient taking differently: Take 20 mEq by mouth daily.)   rosuvastatin (CRESTOR) 20 MG tablet Take 1 tablet (20 mg total) by mouth daily at 6 PM.   sacubitril-valsartan (ENTRESTO) 97-103 MG Take 1 tablet by mouth 2 (two) times daily.   topiramate (TOPAMAX) 100 MG tablet Take 100 mg by mouth 2 (two) times daily.   Radiology:   No results found. CT Code Stroke CTA Head W/WO contrast 03/27/2020 1. Negative for large vessel occlusion but positive for Right MCA M3 occlusion. CT Perfusion detects core infarct with minimally larger penumbra at the right insula/operculum corresponding to the plain CT finding.  2. Additionally, mild vessel irregularity is noted in multiple other circle-of-Willis branches, including the left A1, right P2, left M2. Although nonspecific this constellation of clinical and imaging findings might indicate accelerated branch vessel atherosclerosis. Although other large vessels appear normal, with no atherosclerosis in the neck or at the aortic arch.   MR ANGIO HEAD WO CONTRAST  03/27/2020  1. Moderate-sized acute right MCA infarct. 2. Small subacute left parieto-occipital infarcts. 3. Right M3 branch occlusion, otherwise negative head MRA.0200   CT ABDOMEN PELVIS W CONTRAST  03/27/2020  1. New 18 mm hypoattenuating lesion upper pole right kidney with areas of subcapsular multifocal decreased enhancement in the lower pole right kidney. Imaging features are nonspecific and could be related to pyelonephritis and phlegmon/evolving abscess in the upper pole right kidney cannot be excluded. Alternatively, multifocal right renal infarct could have this appearance. Correlation with urinalysis may prove helpful.  2. Relatively well-defined  2.2 cm lesion in the inferior lingula measures water density. This may be loculated  pleural fluid or intraparenchymal fluid collection in this patient with a history of pulmonary embolus and bilateral ground-glass airspace disease on the previous study suspicious for multifocal atypical/viral pneumonia. Consider follow-up to ensure resolution.  Cardiac Studies:   PCV MYOCARDIAL PERFUSION WITH LEXISCAN 08/14/2020 Lexiscan nuclear stress test performed using 1-day protocol. Left ventricle is dilated. Severe decrease in global myocardial thickening and wall motion. Stress LVEF <20%. Large sized, medium intensity, fixed perfusion defect in anteroapical, apical, inferior, inferoseptal myocardium. High risk study  PCV ECHOCARDIOGRAM COMPLETE 07/25/2020 Severely depressed LV systolic function with visual EF 25-30%. Hypokinetic global wall motion. Left ventricle cavity is dilated. Mild left ventricular hypertrophy. Doppler evidence of grade I (impaired) diastolic dysfunction, elevated LAP. Mild (Grade I) mitral regurgitation. Prior study dated 03/27/2020: LVEF 20-25%, regional wall motion abnormalities, grade 2 diastolic dysfunction, moderately dilated left atrium, trivial MR. Recommendations: May consider limited echo to reevaluate LVEF with Definity or consider MUGA scan to better evaluate LVEF.   LONG TERM MONITOR (8-14 DAYS) 06/22/20-07/06/20:  Predominant underlying rhythm was sinus with minimum heart rate of 71 bpm, maximum heart rate of 193 bpm average heart rate of 91 bpm.  There were no patient triggered events.  PACs and PVCs were rare.  Patient with intermittent right bundle branch block.  2 episodes of supraventricular tachycardia with the longest lasting 12 seconds and a maximum heart rate of 93 bpm.  No evidence of atrial fibrillation.  No ventricular tachycardia, high degree AV block, pauses >3 seconds.  ECHOCARDIOGRAM COMPLETE BUBBLE STUDY  03/27/2020 1. Left ventricular ejection fraction, by estimation, is 20 to 25%. The left ventricle has severely decreased function. There  is severe hypokinesis of the basal-to-apical anteroseptum, inferoseptum and anterior LV walls . Left ventricular diastolic  parameters are consistent with Grade II diastolic dysfunction (pseudonormalization).  2. Right ventricular systolic function is normal. The right ventricular size is normal.  3. Left atrial size was moderately dilated.  4. The mitral valve is normal in structure. Trivial mitral valve regurgitation.  5. The aortic valve is tricuspid. There is mild thickening of the aortic valve. Aortic valve regurgitation is not visualized.  6. The inferior vena cava is normal in size with greater than 50% respiratory variability, suggesting right atrial pressure of 3 mmHg.  7. Agitated saline contrast bubble study was negative, with no evidence of any interatrial shunt.   Comparison(s): Compared to prior echo in 06/2019, the LVEF is now severely reduced to 20-25% with severe hypokinesis of the inferoseptal, anteroseptal and anterior LV walls.  EKG:  03/05/2021: Sinus rhythm at a rate of 62 bpm, new left bundle branch block, no further analysis.  EKG 04/18/2020: Sinus rhythm at a rate of 90 bpm. Left axis deviation. PRWP, cannot exclude anteroseptal infarct old. Non-specific T wave abnormality.   EKG 03/27/2020: Sinus tachycardia at rate of 102 bpm, normal axis, borderline criteria for LVH.  Borderline QT elongation.  Compared to 06/30/2019, sinus tachycardia new.  No change in QT elongation.  Assessment   No diagnosis found.    Medications Discontinued During This Encounter  Medication Reason   fluticasone (FLOVENT HFA) 110 MCG/ACT inhaler Patient Preference   promethazine (PHENERGAN) 25 MG tablet Patient Preference    Meds ordered this encounter  Medications   sodium chloride flush (NS) 0.9 % injection 3 mL   sodium chloride flush (NS) 0.9 % injection 3 mL   0.9 %  sodium chloride  infusion   0.9 %  sodium chloride infusion   aspirin 81 MG chewable tablet    Mahalia Longest   :  cabinet override    Recommendations:   Shelly Shields is a 40 y.o. African-American female with hypertension, hyperlipidemia, uncontrolled diabetes mellitus, obesity and obstructive sleep apnea and history of Covid pneumonia in January 2021 and also had pulmonary embolism during Covid infection.  She is also being evaluated for vasculitis. 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 Patient underwent nuclear stress test which revealed large medium intensity fixed perfusion defect, suggestive of ischemic etiology for cardiomyopathy.    Patient is feeling relatively well overall although she admits to recent dietary noncompliance and weight gain, which is likely contributing to increased blood pressure.  Discussed with patient option of initiating spironolactone given heart failure and elevated blood pressure, however patient prefers to hold off at this time and would first like to focus on making diet and lifestyle modifications including weight loss.  EKG today shows new left bundle branch block.  Patient previously opted to hold off on cardiac catheterization despite heart failure and abnormal stress test findings.  However she is not open to proceeding with cardiac catheterization.  Given new left bundle branch block we will schedule this as soon as able in an outpatient basis as she is presently without symptoms.  The left heart catheterization procedure was explained to the patient in detail. The indication, alternatives, risks and benefits were reviewed. Complications including but not limited to bleeding, infection, acute kidney injury, blood transfusion, heart rhythm disturbances, contrast (dye) reaction, damage to the arteries or nerves in the legs or hands, cerebrovascular accident, myocardial infarction, need for emergent bypass surgery, blood clots in the legs, possible need for emergent blood  transfusion, and rarely death were reviewed and discussed with the patient. The patient voices understanding and wishes to proceed.  Patient's heart rate is under better control today.  We will plan for repeat echocardiogram following cardiac catheterization.  Regarding patient's bilateral shin wounds she has follow-up appointment with PCP recommend she follow closely with them for further management.  Patient also reports she has not been compliant with CPAP lately and is requesting referral for reevaluation of sleep apnea.  No changes are made to patient's medications at this time.  She continues to tolerate anticoagulation without bleeding diathesis.  Follow-up 1 week after cardiac catheterization.  Patient was seen in collaboration with Dr. Jacinto Halim and he is in agreement with the plan.    Emiliano Dyer, PA-C 03/27/2021, 11:51 AM Office: [phone]

## 2021-03-27 NOTE — Interval H&P Note (Signed)
History and Physical Interval Note:  03/27/2021 11:53 AM  Shelly Shields  has presented today for surgery, with the diagnosis of heart failure - cardiomyopathy.  The various methods of treatment have been discussed with the patient and family. After consideration of risks, benefits and other options for treatment, the patient has consented to  Procedure(s): LEFT HEART CATH AND CORONARY ANGIOGRAPHY (N/A) as a surgical intervention.  The patient's history has been reviewed, patient examined, no change in status, stable for surgery.  I have reviewed the patient's chart and labs.  Questions were answered to the patient's satisfaction.    2012 Appropriate Use Criteria for Diagnostic Catheterization Evaluation for ischemic etiology for known heart failure without established etiology Indication:  WITHOUT Angina/ischemic equivalent syndrome A (Appropriate); HF Indication: 7    J 

## 2021-03-28 ENCOUNTER — Encounter (HOSPITAL_COMMUNITY): Payer: Self-pay | Admitting: Cardiology

## 2021-04-12 ENCOUNTER — Other Ambulatory Visit: Payer: Self-pay

## 2021-04-12 ENCOUNTER — Ambulatory Visit: Payer: 59

## 2021-04-12 DIAGNOSIS — I5022 Chronic systolic (congestive) heart failure: Secondary | ICD-10-CM

## 2021-04-12 DIAGNOSIS — I420 Dilated cardiomyopathy: Secondary | ICD-10-CM

## 2021-04-12 MED ORDER — SACUBITRIL-VALSARTAN 97-103 MG PO TABS: 1.0000 | ORAL_TABLET | Freq: Two times a day (BID) | ORAL | 3 refills | Status: DC

## 2021-04-12 MED ORDER — IVABRADINE HCL 5 MG PO TABS: 5.0000 mg | ORAL_TABLET | Freq: Two times a day (BID) | ORAL | 3 refills | Status: DC

## 2021-04-23 ENCOUNTER — Ambulatory Visit
Admission: EM | Admit: 2021-04-23 | Discharge: 2021-04-23 | Disposition: A | Discharge status: Home / Self Care | Payer: 59 | Attending: Internal Medicine | Admitting: Internal Medicine

## 2021-04-23 ENCOUNTER — Encounter: Payer: Self-pay | Admitting: Emergency Medicine

## 2021-04-23 ENCOUNTER — Other Ambulatory Visit: Payer: Self-pay

## 2021-04-23 DIAGNOSIS — J069 Acute upper respiratory infection, unspecified: Secondary | ICD-10-CM | POA: Diagnosis present

## 2021-04-23 DIAGNOSIS — J029 Acute pharyngitis, unspecified: Secondary | ICD-10-CM | POA: Insufficient documentation

## 2021-04-23 LAB — POCT RAPID STREP A (OFFICE): Rapid Strep A Screen: NEGATIVE

## 2021-04-23 MED ORDER — BENZONATATE 100 MG PO CAPS: 100.0000 mg | ORAL_CAPSULE | Freq: Three times a day (TID) | ORAL | 0 refills | Status: DC | PRN

## 2021-04-23 NOTE — ED Provider Notes (Signed)
[facility]    CSN: [id number] Arrival date & time: 04/23/21  1718      History   Chief Complaint No chief complaint on file.   HPI Shelly Shields is a 40 y.o. female.   Patient presents with headache, sore throat, bilateral ear pain,  nonproductive cough, nausea that has been present for 2 days.  Denies any known sick contacts or fever.  Denies chest pain, shortness of breath, nausea, vomiting, diarrhea, abdominal pain.  Patient has not yet taken any medications to help alleviate symptoms.    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 hauler    Diabetes mellitus without complication (HCC)    Hypertension    Migraines    Obesity    Pulmonary embolism (HCC) 05/2019   with COVID   Sleep apnea     Patient Active Problem List   Diagnosis Date Noted   Chronic systolic heart failure (HCC) 03/26/2021   Acute CVA (cerebrovascular accident) (HCC) 03/27/2020   Sleep apnea    Diabetes mellitus without complication (HCC)    COVID-19 long hauler    Obesity    Acute pulmonary embolism (HCC) 06/30/2019   Pulmonary embolism (HCC) 05/2019   Hypernatremia    Asthma, chronic    DKA (diabetic ketoacidoses) 11/27/2014   Diabetes mellitus, new onset (HCC)    Hyperglycemic hyperosmolar nonketotic coma (HCC)    Essential hypertension     Past Surgical History:  Procedure Laterality Date   BILATERAL CARPAL TUNNEL RELEASE     LEFT HEART CATH AND CORONARY ANGIOGRAPHY N/A 03/27/2021   Procedure: LEFT HEART CATH AND CORONARY ANGIOGRAPHY;  Surgeon: Elder Negus, MD;  Location: [facility];  Service: Cardiovascular;  Laterality: N/A;   TEE WITHOUT CARDIOVERSION N/A 03/28/2020   Procedure: TRANSESOPHAGEAL ECHOCARDIOGRAM (TEE);  Surgeon: Yates Decamp, MD;  Location: [facility] ENDOSCOPY;  Service: Cardiovascular;  Laterality: N/A;    OB History   No obstetric history on file.      Home Medications    Prior to Admission medications    Medication Sig Start Date End Date Taking? Authorizing Provider  benzonatate (TESSALON) 100 MG capsule Take 1 capsule (100 mg total) by mouth every 8 (eight) hours as needed for cough. 04/23/21  Yes Kyle Stansell, Acie Fredrickson, FNP  apixaban (ELIQUIS) 5 MG TABS tablet Take 1 tablet (5 mg total) by mouth 2 (two) times daily. Resume on 11/2 morning 03/27/21   Patwardhan, Anabel Bene, MD  Baclofen 5 MG TABS Take 5-10 mg by mouth daily as needed (Migraines). 08/09/19   [provider]  Blood Glucose Monitoring Suppl (ACCU-CHEK NANO SMARTVIEW) W/DEVICE KIT 1 Device by Does not apply route 4 (four) times daily - after meals and at bedtime. 11/29/14   Abram Sander, MD  carvedilol (COREG) 25 MG tablet Take 2 tablets (50 mg total) by mouth 2 (two) times daily with a meal. 03/05/21   Cantwell, Celeste C, PA-C  Continuous Blood Gluc Sensor (DEXCOM G6 SENSOR) MISC SMARTSIG:1 Topical Every 10 Days 10/18/20   [provider]  Continuous Blood Gluc Transmit (DEXCOM G6 TRANSMITTER) MISC See admin instructions. 08/16/20   [provider]  Dulaglutide 1.5 MG/0.5ML SOPN Inject 1.5 mg into the skin once a week. 06/05/20   [provider]  escitalopram (LEXAPRO) 10 MG tablet Take 10 mg by mouth daily. 03/01/20   [provider]  fexofenadine (ALLEGRA) 180 MG tablet Take 180 mg by mouth daily.  [provider]  fluticasone (FLONASE) 50 MCG/ACT nasal spray Place 2 sprays into both nostrils daily as needed for allergies.  09/22/14   [provider]  furosemide (LASIX) 40 MG tablet Take 1 tablet (40 mg total) by mouth daily as needed for fluid or edema. Patient taking differently: Take 40 mg by mouth daily. 03/05/21 03/05/22  Cantwell, Celeste C, PA-C  glucose blood (ACCU-CHEK SMARTVIEW) test strip Check sugar 6 x daily Patient taking differently: Check sugar 6 x daily. Uses true test instead 11/29/14   Frazier Richards, MD  Insulin Disposable Pump (OMNIPOD DASH PODS, GEN 4,) MISC 200  Units every other day. Humalog 02/23/21   [provider]  Insulin Infusion Pump (DEXCOM G6 BASAL-IQ INSULIN PMP) DEVI by Does not apply route.    [provider]  Insulin Pen Needle (INSUPEN PEN NEEDLES) 32G X 4 MM MISC BD Pen Needles- brand specific. Inject insulin via insulin pen daily 11/29/14   Frazier Richards, MD  ivabradine (CORLANOR) 5 MG TABS tablet Take 1 tablet (5 mg total) by mouth 2 (two) times daily with a meal. 04/12/21   Cantwell, Gideon C, PA-C  Lancet Devices (ACCU-CHEK [place]) lancets Use as instructed for blood glucose checks four times daily, before meals and at bedtime 11/29/14   Frazier Richards, MD  medroxyPROGESTERone (DEPO-PROVERA) 150 MG/ML injection Inject 150 mg into the muscle every 3 (three) months.    [provider]  montelukast (SINGULAIR) 10 MG tablet Take 10 mg by mouth at bedtime.    [provider]  nortriptyline (PAMELOR) 10 MG capsule Take 20 mg by mouth at bedtime.    [provider]  ondansetron (ZOFRAN) 4 MG tablet Take 1 tablet (4 mg total) by mouth every 8 (eight) hours as needed for nausea or vomiting. 07/03/19   Thurnell Lose, MD  pantoprazole (PROTONIX) 40 MG tablet Take 40 mg by mouth daily.    [provider]  potassium chloride SA (KLOR-CON) 10 MEQ tablet Take 1 tablet (10 mEq total) by mouth 2 (two) times daily. Patient taking differently: Take 20 mEq by mouth daily. 01/16/21   Cantwell, Celeste C, PA-C  rosuvastatin (CRESTOR) 20 MG tablet Take 1 tablet (20 mg total) by mouth daily at 6 PM. 03/30/20   Elgergawy, Silver Huguenin, MD  sacubitril-valsartan (ENTRESTO) 97-103 MG Take 1 tablet by mouth 2 (two) times daily. 04/12/21   Cantwell, Celeste C, PA-C  topiramate (TOPAMAX) 100 MG tablet Take 100 mg by mouth 2 (two) times daily.    [provider]  TRUEPLUS LANCETS 33G MISC USE TO CHECK BLOOD GLUCOSE QID - AFTER MEALS AND AT BEDTIME 12/22/14   [provider]  VENTOLIN HFA 108 (90 BASE)  MCG/ACT inhaler Use as directed 2 puffs in the mouth or throat 4 (four) times daily as needed for shortness of breath.  12/21/14   [provider]    Family History Family History  Problem Relation Age of Onset   Hypertension Mother    Thyroid disease Mother    Hypertension Father    Stroke Father 70   Diabetes Father    Sleep apnea Father    CVA Paternal Uncle 64   Heart failure Paternal Grandfather     Social History Social History   Tobacco Use   Smoking status: Never   Smokeless tobacco: Never  Vaping Use   Vaping Use: Never used  Substance Use Topics   Alcohol use: Yes    Alcohol/week: 0.0  standard drinks    Comment: occasional    Drug use: No     Allergies   Lisinopril, Metformin, Ibuprofen, and Ketorolac   Review of Systems Review of Systems Per HPI  Physical Exam Triage Vital Signs ED Triage Vitals [04/23/21 1923]  Enc Vitals Group     BP 138/80     Pulse Rate 95     Resp 16     Temp 98.9 F (37.2 C)     Temp Source Oral     SpO2 99 %     Weight      Height      Head Circumference      Peak Flow      Pain Score 2     Pain Loc      Pain Edu?      Excl. in [place]?    No data found.  Updated Vital Signs BP 138/80 (BP Location: Left Arm)   Pulse 95   Temp 98.9 F (37.2 C) (Oral)   Resp 16   LMP  (LMP Unknown) Comment: been ob depo shot for years  SpO2 99%   Visual Acuity Right Eye Distance:   Left Eye Distance:   Bilateral Distance:    Right Eye Near:   Left Eye Near:    Bilateral Near:     Physical Exam Constitutional:      General: She is not in acute distress.    Appearance: Normal appearance. She is not toxic-appearing or diaphoretic.  HENT:     Head: Normocephalic and atraumatic.     Right Ear: Ear canal normal. No drainage, swelling or tenderness. A middle ear effusion is present. Tympanic membrane is not perforated, erythematous or bulging.     Left Ear: Ear canal normal. No drainage or swelling. A middle ear  effusion is present. Tympanic membrane is not perforated, erythematous or bulging.     Nose: Congestion present.     Mouth/Throat:     Mouth: Mucous membranes are moist.     Pharynx: Posterior oropharyngeal erythema present.  Eyes:     Extraocular Movements: Extraocular movements intact.     Conjunctiva/sclera: Conjunctivae normal.     Pupils: Pupils are equal, round, and reactive to light.  Cardiovascular:     Rate and Rhythm: Normal rate and regular rhythm.     Pulses: Normal pulses.     Heart sounds: Normal heart sounds.  Pulmonary:     Effort: Pulmonary effort is normal. No respiratory distress.     Breath sounds: Normal breath sounds. No stridor. No wheezing, rhonchi or rales.  Abdominal:     General: Abdomen is flat. Bowel sounds are normal.     Palpations: Abdomen is soft.  Musculoskeletal:        General: Normal range of motion.     Cervical back: Normal range of motion.  Skin:    General: Skin is warm and dry.  Neurological:     General: No focal deficit present.     Mental Status: She is alert and oriented to person, place, and time. Mental status is at baseline.  Psychiatric:        Mood and Affect: Mood normal.        Behavior: Behavior normal.     UC Treatments / Results  Labs (all labs ordered are listed, but only abnormal results are displayed) Labs Reviewed  CULTURE, GROUP A STREP ([place])  COVID-19, FLU A+B NAA  POCT RAPID STREP A (OFFICE)  EKG   Radiology No results found.  Procedures Procedures (including critical care time)  Medications Ordered in UC Medications - No data to display  Initial Impression / Assessment and Plan / UC Course  I have reviewed the triage vital signs and the nursing notes.  Pertinent labs & imaging results that were available during my care of the patient were reviewed by me and considered in my medical decision making (see chart for details).     Patient presents with symptoms likely from a viral upper  respiratory infection. Differential includes bacterial pneumonia, sinusitis, allergic rhinitis, Covid 19, flu. Do not suspect underlying cardiopulmonary process. Symptoms seem unlikely related to ACS, CHF or COPD exacerbations, pneumonia, pneumothorax. Patient is nontoxic appearing and not in need of emergent medical intervention.  Rapid strep test was negative.  Throat culture and COVID-19 and flu swab are pending.  Limited options on symptom management given patient's history.  Suggested Coricidin HBP.  Will prescribe benzonatate to take as needed for cough.  Suggested Flonase in addition to antihistamine to help alleviate fluid in the ears.  Return if symptoms fail to improve in 1-2 weeks or you develop shortness of breath, chest pain, severe headache. Patient states understanding and is agreeable.  Discharged with PCP followup.  Final Clinical Impressions(s) / UC Diagnoses   Final diagnoses:  Viral upper respiratory tract infection with cough  Sore throat     Discharge Instructions      It appears that you have a viral upper respiratory infection that should resolve in the next few days with symptomatic treatment.  Your rapid strep test was negative.  Throat culture, COVID-19, flu test is pending.  We will call if it is positive.  You have been prescribed a cough medication take as needed.  Also recommend Coricidin HBP as this will not raise your blood pressure.    ED Prescriptions     Medication Sig Dispense Auth. Provider   benzonatate (TESSALON) 100 MG capsule Take 1 capsule (100 mg total) by mouth every 8 (eight) hours as needed for cough. 21 capsule [place], Michele Rockers, [place]      PDMP not reviewed this encounter.   Teodora Medici, [place] 04/23/21 2012

## 2021-04-23 NOTE — Discharge Instructions (Signed)
It appears that you have a viral upper respiratory infection that should resolve in the next few days with symptomatic treatment.  Your rapid strep test was negative.  Throat culture, COVID-19, flu test is pending.  We will call if it is positive.  You have been prescribed a cough medication take as needed.  Also recommend Coricidin HBP as this will not raise your blood pressure.

## 2021-04-23 NOTE — ED Triage Notes (Signed)
Headache, sore throat, bilateral ear pain, cough, nausea  x 2 days. Denies fever, diarrhea, facial pain

## 2021-04-25 LAB — COVID-19, FLU A+B NAA
Influenza A, NAA: NOT DETECTED
Influenza B, NAA: NOT DETECTED
SARS-CoV-2, NAA: NOT DETECTED

## 2021-04-26 NOTE — Progress Notes (Signed)
Primary Physician/Referring:  [name], MD  Patient ID: Shelly Shields, female    DOB: 03-Apr-1981, 40 y.o.   MRN: [id number]  Chief Complaint  Patient presents with   chronic systolic heart failure   Follow-up   Results   HPI:    Shelly Shields  is a 40 y.o. African-American female with hypertension, hyperlipidemia, uncontrolled diabetes mellitus, obesity and obstructive sleep apnea on CPAP and history of Covid pneumonia in January 2021 and also had pulmonary embolism during Covid infection.  She is also being evaluated for vasculitis. 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  Patient underwent nuclear stress test which revealed large medium intensity fixed perfusion defect, suggestive of ischemic etiology for cardiomyopathy.  She subsequently underwent cardiac catheterization which revealed no significant CAD, therefore myopathy is nonischemic.  Notably in the past patient has been unable tolerate [place] due to recurrent yeast infections.  Patient presents for follow-up.  She is presently being treated for upper respiratory infection by PCP.  Patient continues to have cough congestion, and rhinorrhea.  Notably her heart rate is elevated at today's office visit likely secondary to URI, however on home monitoring it is typically well controlled at 70 bpm. Denies chest pain, palpitations, orthopnea, PND, syncope, near syncope, dizziness.  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   She is now following with wound management for bilateral anterior shin wounds.  Currently taking Lasix 2-3 times per week.  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 hauler    Diabetes mellitus without complication ([place])    Hypertension    Migraines    Obesity    Pulmonary embolism ([place]) 05/2019   with COVID   Sleep apnea     Past Surgical History:  Procedure Laterality Date   BILATERAL CARPAL TUNNEL RELEASE     LEFT HEART CATH AND CORONARY ANGIOGRAPHY N/A 03/27/2021   Procedure: LEFT HEART CATH AND CORONARY ANGIOGRAPHY;  Surgeon: Nigel Mormon, MD;  Location: [facility];  Service: Cardiovascular;  Laterality: N/A;   TEE WITHOUT CARDIOVERSION N/A 03/28/2020   Procedure: TRANSESOPHAGEAL ECHOCARDIOGRAM (TEE);  Surgeon: Adrian Prows, MD;  Location: [facility];  Service: Cardiovascular;  Laterality: N/A;   Family History  Problem Relation Age of Onset   Hypertension Mother    Thyroid disease Mother    Hypertension Father    Stroke Father 75   Diabetes Father    Sleep apnea Father    CVA Paternal Uncle 33   Heart failure Paternal Grandfather     Social History   Tobacco Use   Smoking status: Never   Smokeless tobacco: Never  Substance Use Topics   Alcohol use: Yes    Alcohol/week: 0.0 standard drinks    Comment: occasional    Marital Status: Single   ROS  Review of Systems  Constitutional: Negative for malaise/fatigue and weight gain.  HENT:  Positive for congestion.   Cardiovascular:  Positive for dyspnea on exertion (mild) and leg swelling (intermittent ). Negative for chest pain, claudication, near-syncope, orthopnea, palpitations, paroxysmal nocturnal dyspnea and syncope.  Respiratory:  Positive for cough. Negative for shortness of breath.   Hematologic/Lymphatic: Does not bruise/bleed easily.  Gastrointestinal:  Negative for melena.  Neurological:  Negative for dizziness and weakness.   Objective  Blood pressure 136/79, pulse 92, temperature 98.7 F (37.1  C), temperature source Temporal, height $RemoveBeforeDE'5\' 1"'zLAKxbjaoHsANuL$  (1.549 m), weight 234 lb (106.1 kg), SpO2 99 %.  Vitals with BMI 04/27/2021 04/23/2021 03/27/2021  Height $Remov'5\' 1"'eAGbMW$  - -  Weight 234 lbs - -  BMI 32.67 - -  Systolic 124 580 998  Diastolic 79 80 61  Pulse 92 95 69      Physical Exam Vitals reviewed.  Constitutional:       Appearance: She is obese.  Cardiovascular:     Rate and Rhythm: Normal rate and regular rhythm.     Pulses: Intact distal pulses.     Heart sounds: S1 normal and S2 normal. No murmur heard.   No gallop.     Comments: No JVD.  Pulmonary:     Effort: Pulmonary effort is normal. No respiratory distress.     Breath sounds: No wheezing, rhonchi or rales.  Musculoskeletal:     Right lower leg: Edema (trace) present.     Left lower leg: Edema (trace) present.  Skin:    Comments: Wounds on anterior shins bilaterally as noted in pictures below.  Neurological:     Mental Status: She is alert.     Cranial Nerves: No cranial nerve deficit.     Gait: Gait (Essentially normal) normal.    Laboratory examination:   Recent Labs    05/22/20 1624 06/07/20 1545 07/07/20 1559 03/19/21 1405  NA 141 143 142 143  K 3.6 4.0 3.9 3.7  CL 111* 110* 113* 114*  CO2 17* 21 16* 18*  GLUCOSE 251* 116* 116* 109*  BUN $Re'11 12 10 10  'DoK$ CREATININE 0.95 0.96 0.95 1.01*  CALCIUM 8.8 9.2 9.0 9.0  GFRNONAA 75 74 75  --   GFRAA 87 86 87  --    CrCl cannot be calculated (Patient's most recent lab result is older than the maximum 21 days allowed.).  CMP Latest Ref Rng & Units 03/19/2021 07/07/2020 06/07/2020  Glucose 70 - 99 mg/dL 109(H) 116(H) 116(H)  BUN 6 - 24 mg/dL $Remove'10 10 12  'TeswdAx$ Creatinine 0.57 - 1.00 mg/dL 1.01(H) 0.95 0.96  Sodium 134 - 144 mmol/L 143 142 143  Potassium 3.5 - 5.2 mmol/L 3.7 3.9 4.0  Chloride 96 - 106 mmol/L 114(H) 113(H) 110(H)  CO2 20 - 29 mmol/L 18(L) 16(L) 21  Calcium 8.7 - 10.2 mg/dL 9.0 9.0 9.2  Total Protein 6.5 - 8.1 g/dL - - -  Total Bilirubin 0.3 - 1.2 mg/dL - - -  Alkaline Phos 38 - 126 U/L - - -  AST 15 - 41 U/L - - -  ALT 0 - 44 U/L - - -   CBC Latest Ref Rng & Units 03/19/2021 03/29/2020 03/27/2020  WBC 3.4 - 10.8 x10E3/uL 8.3 10.4 -  Hemoglobin 11.1 - 15.9 g/dL 13.3 12.7 12.9  Hematocrit 34.0 - 46.6 % 41.1 38.3 38.0  Platelets 150 - 450 x10E3/uL 276 245 -    Lipid  Panel No results for input(s): CHOL, TRIG, LDLCALC, VLDL, HDL, CHOLHDL, LDLDIRECT in the last 8760 hours.   HEMOGLOBIN A1C Lab Results  Component Value Date   HGBA1C 11.2 (H) 03/27/2020   MPG 274.74 03/27/2020   TSH No results for input(s): TSH in the last 8760 hours.  External labs:  None  Allergies   Allergies  Allergen Reactions   Lisinopril Cough   Metformin Diarrhea   Farxiga [Dapagliflozin]     Recurrent yeast infections   Ibuprofen Hives   Ketorolac Hives    Medications Prior to Visit:  Outpatient Medications Prior to Visit  Medication Sig Dispense Refill   apixaban (ELIQUIS) 5 MG TABS tablet Take 1 tablet (5 mg total) by mouth 2 (two) times daily. Resume on 11/2 morning 60 tablet 0   Baclofen 5 MG TABS Take 5-10 mg by mouth daily as needed (Migraines).     benzonatate (TESSALON) 100 MG capsule Take 1 capsule (100 mg total) by mouth every 8 (eight) hours as needed for cough. 21 capsule 0   Blood Glucose Monitoring Suppl (ACCU-CHEK NANO SMARTVIEW) W/DEVICE KIT 1 Device by Does not apply route 4 (four) times daily - after meals and at bedtime. 1 kit 0   carvedilol (COREG) 25 MG tablet Take 2 tablets (50 mg total) by mouth 2 (two) times daily with a meal. 180 tablet 3   Continuous Blood Gluc Sensor (DEXCOM G6 SENSOR) MISC SMARTSIG:1 Topical Every 10 Days     Continuous Blood Gluc Transmit (DEXCOM G6 TRANSMITTER) MISC See admin instructions.     Dulaglutide 1.5 MG/0.5ML SOPN Inject 1.5 mg into the skin once a week.     escitalopram (LEXAPRO) 10 MG tablet Take 10 mg by mouth daily.     fexofenadine (ALLEGRA) 180 MG tablet Take 180 mg by mouth daily.     fluticasone (FLONASE) 50 MCG/ACT nasal spray Place 2 sprays into both nostrils daily as needed for allergies.   3   furosemide (LASIX) 40 MG tablet Take 1 tablet (40 mg total) by mouth daily as needed for fluid or edema. (Patient taking differently: Take 40 mg by mouth daily.) 90 tablet 3   glucose blood (ACCU-CHEK  SMARTVIEW) test strip Check sugar 6 x daily (Patient taking differently: Check sugar 6 x daily. Uses true test instead) 200 each 3   Insulin Disposable Pump (OMNIPOD DASH PODS, GEN 4,) MISC 200 Units every other day. Humalog     Insulin Infusion Pump (DEXCOM G6 BASAL-IQ INSULIN PMP) DEVI by Does not apply route.     Insulin Pen Needle (INSUPEN PEN NEEDLES) 32G X 4 MM MISC BD Pen Needles- brand specific. Inject insulin via insulin pen daily 200 each 3   ivabradine (CORLANOR) 5 MG TABS tablet Take 1 tablet (5 mg total) by mouth 2 (two) times daily with a meal. 90 tablet 3   Lancet Devices (ACCU-CHEK SOFTCLIX) lancets Use as instructed for blood glucose checks four times daily, before meals and at bedtime 100 each 5   medroxyPROGESTERone (DEPO-PROVERA) 150 MG/ML injection Inject 150 mg into the muscle every 3 (three) months.     montelukast (SINGULAIR) 10 MG tablet Take 10 mg by mouth at bedtime.     nortriptyline (PAMELOR) 10 MG capsule Take 20 mg by mouth at bedtime.     ondansetron (ZOFRAN) 4 MG tablet Take 1 tablet (4 mg total) by mouth every 8 (eight) hours as needed for nausea or vomiting. 20 tablet 0   pantoprazole (PROTONIX) 40 MG tablet Take 40 mg by mouth daily.     predniSONE (DELTASONE) 10 MG tablet Take 10 mg by mouth 2 (two) times daily.     rosuvastatin (CRESTOR) 20 MG tablet Take 1 tablet (20 mg total) by mouth daily at 6 PM. 30 tablet 0   topiramate (TOPAMAX) 100 MG tablet Take 100 mg by mouth 2 (two) times daily.     VENTOLIN HFA 108 (90 BASE) MCG/ACT inhaler Use as directed 2 puffs in the mouth or throat 4 (four) times daily as needed for shortness of breath.   3  potassium chloride SA (KLOR-CON) 10 MEQ tablet Take 1 tablet (10 mEq total) by mouth 2 (two) times daily. (Patient taking differently: Take 20 mEq by mouth daily.) 180 tablet 1   sacubitril-valsartan (ENTRESTO) 97-103 MG Take 1 tablet by mouth 2 (two) times daily. 180 tablet 3   FLOVENT HFA 110 MCG/ACT inhaler Inhale 2  puffs into the lungs 2 (two) times daily.     NURTEC 75 MG TBDP Take 1 tablet by mouth daily.     TRUEPLUS LANCETS 33G MISC USE TO CHECK BLOOD GLUCOSE QID - AFTER MEALS AND AT BEDTIME  3   No facility-administered medications prior to visit.   Final Medications at End of Visit    Current Meds  Medication Sig   apixaban (ELIQUIS) 5 MG TABS tablet Take 1 tablet (5 mg total) by mouth 2 (two) times daily. Resume on 11/2 morning   Baclofen 5 MG TABS Take 5-10 mg by mouth daily as needed (Migraines).   benzonatate (TESSALON) 100 MG capsule Take 1 capsule (100 mg total) by mouth every 8 (eight) hours as needed for cough.   Blood Glucose Monitoring Suppl (ACCU-CHEK NANO SMARTVIEW) W/DEVICE KIT 1 Device by Does not apply route 4 (four) times daily - after meals and at bedtime.   carvedilol (COREG) 25 MG tablet Take 2 tablets (50 mg total) by mouth 2 (two) times daily with a meal.   Continuous Blood Gluc Sensor (DEXCOM G6 SENSOR) MISC SMARTSIG:1 Topical Every 10 Days   Continuous Blood Gluc Transmit (DEXCOM G6 TRANSMITTER) MISC See admin instructions.   Dulaglutide 1.5 MG/0.5ML SOPN Inject 1.5 mg into the skin once a week.   escitalopram (LEXAPRO) 10 MG tablet Take 10 mg by mouth daily.   fexofenadine (ALLEGRA) 180 MG tablet Take 180 mg by mouth daily.   fluticasone (FLONASE) 50 MCG/ACT nasal spray Place 2 sprays into both nostrils daily as needed for allergies.    furosemide (LASIX) 40 MG tablet Take 1 tablet (40 mg total) by mouth daily as needed for fluid or edema. (Patient taking differently: Take 40 mg by mouth daily.)   glucose blood (ACCU-CHEK SMARTVIEW) test strip Check sugar 6 x daily (Patient taking differently: Check sugar 6 x daily. Uses true test instead)   Insulin Disposable Pump (OMNIPOD DASH PODS, GEN 4,) MISC 200 Units every other day. Humalog   Insulin Infusion Pump (DEXCOM G6 BASAL-IQ INSULIN PMP) DEVI by Does not apply route.   Insulin Pen Needle (INSUPEN PEN NEEDLES) 32G X 4 MM  MISC BD Pen Needles- brand specific. Inject insulin via insulin pen daily   ivabradine (CORLANOR) 5 MG TABS tablet Take 1 tablet (5 mg total) by mouth 2 (two) times daily with a meal.   Lancet Devices (ACCU-CHEK SOFTCLIX) lancets Use as instructed for blood glucose checks four times daily, before meals and at bedtime   medroxyPROGESTERone (DEPO-PROVERA) 150 MG/ML injection Inject 150 mg into the muscle every 3 (three) months.   montelukast (SINGULAIR) 10 MG tablet Take 10 mg by mouth at bedtime.   nortriptyline (PAMELOR) 10 MG capsule Take 20 mg by mouth at bedtime.   ondansetron (ZOFRAN) 4 MG tablet Take 1 tablet (4 mg total) by mouth every 8 (eight) hours as needed for nausea or vomiting.   pantoprazole (PROTONIX) 40 MG tablet Take 40 mg by mouth daily.   predniSONE (DELTASONE) 10 MG tablet Take 10 mg by mouth 2 (two) times daily.   rosuvastatin (CRESTOR) 20 MG tablet Take 1 tablet (20 mg total) by  mouth daily at 6 PM.   sacubitril-valsartan (ENTRESTO) 97-103 MG Take 1 tablet by mouth 2 (two) times daily.   spironolactone (ALDACTONE) 25 MG tablet Take 0.5 tablets (12.5 mg total) by mouth daily.   topiramate (TOPAMAX) 100 MG tablet Take 100 mg by mouth 2 (two) times daily.   VENTOLIN HFA 108 (90 BASE) MCG/ACT inhaler Use as directed 2 puffs in the mouth or throat 4 (four) times daily as needed for shortness of breath.    [DISCONTINUED] potassium chloride SA (KLOR-CON) 10 MEQ tablet Take 1 tablet (10 mEq total) by mouth 2 (two) times daily. (Patient taking differently: Take 20 mEq by mouth daily.)   [DISCONTINUED] sacubitril-valsartan (ENTRESTO) 97-103 MG Take 1 tablet by mouth 2 (two) times daily.   Radiology:   No results found. CT Code Stroke CTA Head W/WO contrast 03/27/2020 1. Negative for large vessel occlusion but positive for Right MCA M3 occlusion. CT Perfusion detects core infarct with minimally larger penumbra at the right insula/operculum corresponding to the plain CT finding.  2.  Additionally, mild vessel irregularity is noted in multiple other circle-of-Willis branches, including the left A1, right P2, left M2. Although nonspecific this constellation of clinical and imaging findings might indicate accelerated branch vessel atherosclerosis. Although other large vessels appear normal, with no atherosclerosis in the neck or at the aortic arch.   MR ANGIO HEAD WO CONTRAST  03/27/2020  1. Moderate-sized acute right MCA infarct. 2. Small subacute left parieto-occipital infarcts. 3. Right M3 branch occlusion, otherwise negative head [id number]   CT ABDOMEN PELVIS W CONTRAST  03/27/2020  1. New 18 mm hypoattenuating lesion upper pole right kidney with areas of subcapsular multifocal decreased enhancement in the lower pole right kidney. Imaging features are nonspecific and could be related to pyelonephritis and phlegmon/evolving abscess in the upper pole right kidney cannot be excluded. Alternatively, multifocal right renal infarct could have this appearance. Correlation with urinalysis may prove helpful.  2. Relatively well-defined 2.2 cm lesion in the inferior lingula measures water density. This may be loculated pleural fluid or intraparenchymal fluid collection in this patient with a history of pulmonary embolus and bilateral ground-glass airspace disease on the previous study suspicious for multifocal atypical/viral pneumonia. Consider follow-up to ensure resolution.  Cardiac Studies:  Left heart catheterization 03/27/2021:  LM: Normal LAD: Normal         Prox diag 1 30% disease Lcx: Normal RCA: Normal   Mildly decompensated nonischemic cardiomyopathy Continue GDMT for HFrEF  PCV ECHOCARDIOGRAM COMPLETE 04/12/2021 Left ventricle cavity is normal in size. Normal left ventricular wall thickness. Severe global hypokinesis. LVEF 25-30%. Indeterminate diastolic filling pattern. No significant valvular abnormality. Normal right atrial pressure. No significant change compared to  previous study on 07/25/2020.  PCV MYOCARDIAL PERFUSION WITH LEXISCAN 08/14/2020 Lexiscan nuclear stress test performed using 1-day protocol. Left ventricle is dilated. Severe decrease in global myocardial thickening and wall motion. Stress LVEF <20%. Large sized, medium intensity, fixed perfusion defect in anteroapical, apical, inferior, inferoseptal myocardium. High risk study   LONG TERM MONITOR (8-14 DAYS) 06/22/20-07/06/20:  Predominant underlying rhythm was sinus with minimum heart rate of 71 bpm, maximum heart rate of 193 bpm average heart rate of 91 bpm.  There were no patient triggered events.  PACs and PVCs were rare.  Patient with intermittent right bundle branch block.  2 episodes of supraventricular tachycardia with the longest lasting 12 seconds and a maximum heart rate of 93 bpm.  No evidence of atrial fibrillation.  No ventricular tachycardia, high  degree AV block, pauses >3 seconds.  EKG:  03/05/2021: Sinus rhythm at a rate of 62 bpm, new left bundle branch block, no further analysis.  EKG 04/18/2020: Sinus rhythm at a rate of 90 bpm. Left axis deviation. PRWP, cannot exclude anteroseptal infarct old. Non-specific T wave abnormality.   EKG 03/27/2020: Sinus tachycardia at rate of 102 bpm, normal axis, borderline criteria for LVH.  Borderline QT elongation.  Compared to 06/30/2019, sinus tachycardia new.  No change in QT elongation.  Assessment     ICD-10-CM   1. Chronic systolic heart failure (HCC)  I50.38 Basic metabolic panel    Basic metabolic panel    2. Nonischemic cardiomyopathy (HCC)  I42.8     3. Essential hypertension  I10        Medications Discontinued During This Encounter  Medication Reason   potassium chloride SA (KLOR-CON) 10 MEQ tablet Discontinued by provider   sacubitril-valsartan (ENTRESTO) 97-103 MG Discontinued by provider    Meds ordered this encounter  Medications   spironolactone (ALDACTONE) 25 MG tablet    Sig: Take 0.5 tablets (12.5 mg total)  by mouth daily.    Dispense:  45 tablet    Refill:  3   sacubitril-valsartan (ENTRESTO) 97-103 MG    Sig: Take 1 tablet by mouth 2 (two) times daily.    Dispense:  60 tablet    Refill:  3    Recommendations:   Shelly Shields is a 40 y.o. African-American female with hypertension, hyperlipidemia, uncontrolled diabetes mellitus, obesity and obstructive sleep apnea and history of Covid pneumonia in January 2021 and also had pulmonary embolism during Covid infection.  She is also being evaluated for vasculitis. 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 Patient underwent nuclear stress test which revealed large medium intensity fixed perfusion defect, suggestive of ischemic etiology for cardiomyopathy.  She subsequently underwent cardiac catheterization which revealed no significant CAD, therefore myopathy is nonischemic.  Notably in the past patient has been unable tolerate [place] due to recurrent yeast infections.  Patient presents for follow-up of nonischemic cardiomyopathy.  There is no clinical evidence of acute decompensated heart failure.  Reviewed and discussed results of echocardiogram and cardiac catheterization, details above.  Patient reports improved compliance with CPAP.  He is tolerating anticoagulation without bleeding diathesis.  She is tolerating present guideline directed medical therapy. unfortunately she is unable to tolerate SGLT2 inhibitors due to recurrent yeast infections.  Patient is now willing to start spironolactone, will add spironolactone 12.5 mg p.o. daily.  We will repeat BMP in 1 week.  Continue Entresto, carvedilol, Corlanor.   Follow-up in 6 months, sooner if needed, for nonischemic cardiomyopathy, hypertension, hyperlipidemia.   Alethia Berthold, PA-C 04/27/2021, 3:13 PM Office: [phone]

## 2021-04-27 ENCOUNTER — Ambulatory Visit: Payer: 59 | Admitting: Student

## 2021-04-27 ENCOUNTER — Encounter: Payer: Self-pay | Admitting: Student

## 2021-04-27 ENCOUNTER — Other Ambulatory Visit: Payer: Self-pay

## 2021-04-27 VITALS — BP 136/79 | HR 92 | Temp 98.7°F | Ht 61.0 in | Wt 234.0 lb

## 2021-04-27 DIAGNOSIS — I428 Other cardiomyopathies: Secondary | ICD-10-CM

## 2021-04-27 DIAGNOSIS — I5022 Chronic systolic (congestive) heart failure: Secondary | ICD-10-CM

## 2021-04-27 DIAGNOSIS — I10 Essential (primary) hypertension: Secondary | ICD-10-CM

## 2021-04-27 MED ORDER — ENTRESTO 97-103 MG PO TABS: 1.0000 | ORAL_TABLET | Freq: Two times a day (BID) | ORAL | 3 refills | Status: DC

## 2021-04-27 MED ORDER — SPIRONOLACTONE 25 MG PO TABS: 12.5000 mg | ORAL_TABLET | Freq: Every day | ORAL | 3 refills | Status: DC

## 2021-04-28 LAB — CULTURE, GROUP A STREP (THRC)

## 2021-04-30 ENCOUNTER — Telehealth: Payer: Self-pay | Admitting: Student

## 2021-04-30 NOTE — Telephone Encounter (Signed)
Pt has been sickness possibly effecting her heart. Requesting to speak to a MA

## 2021-05-01 NOTE — Telephone Encounter (Signed)
Patient was just seen and is stable from cardiac standpoint. Advise patient to follow up with PCP for further evaluation of the cough.

## 2021-05-01 NOTE — Telephone Encounter (Signed)
Called pt to see what was going on. Pt mention she has been cough for awhile and has gotten worse since last week. And is scare that the cough will affect her heart. Pt mention she went to her lung Dr. Please advise.

## 2021-05-01 NOTE — Telephone Encounter (Signed)
Pt has been informed about message above

## 2021-05-16 ENCOUNTER — Telehealth: Payer: Self-pay

## 2021-05-16 NOTE — Telephone Encounter (Signed)
Patient called and stated that another provider is wanting her to start on a new medication called Nuedexta and she wants to know if it will interfere with her other heart medications or her heart. Please advise.

## 2021-05-18 NOTE — Telephone Encounter (Signed)
I have no problem with her taking it.

## 2021-06-27 ENCOUNTER — Other Ambulatory Visit: Payer: Self-pay

## 2021-06-27 ENCOUNTER — Ambulatory Visit
Admission: EM | Admit: 2021-06-27 | Discharge: 2021-06-27 | Disposition: A | Discharge status: Home / Self Care | Payer: 59 | Attending: Internal Medicine | Admitting: Internal Medicine

## 2021-06-27 DIAGNOSIS — J069 Acute upper respiratory infection, unspecified: Secondary | ICD-10-CM | POA: Insufficient documentation

## 2021-06-27 DIAGNOSIS — J029 Acute pharyngitis, unspecified: Secondary | ICD-10-CM | POA: Insufficient documentation

## 2021-06-27 LAB — POCT INFLUENZA A/B
Influenza A, POC: NEGATIVE
Influenza B, POC: NEGATIVE

## 2021-06-27 LAB — POCT RAPID STREP A (OFFICE): Rapid Strep A Screen: NEGATIVE

## 2021-06-27 MED ORDER — BENZONATATE 100 MG PO CAPS: 100.0000 mg | ORAL_CAPSULE | Freq: Three times a day (TID) | ORAL | 0 refills | Status: DC | PRN

## 2021-06-27 NOTE — ED Provider Notes (Signed)
[facility]    CSN: [id number] Arrival date & time: 06/27/21  1004      History   Chief Complaint Chief Complaint  Patient presents with   Generalized Body Aches    HPI Shelly Shields is a 41 y.o. female.   Patient presents with 1 day history of body aches, chills, nasal congestion, sore throat, cough.  Denies any known sick contacts.  Patient not sure of T-max at home.  Taking Coricidin HBP with some improvement in symptoms.  Denies chest pain, shortness of breath, nausea, vomiting, diarrhea, abdominal pain.    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    Obesity    Acute pulmonary embolism ([place]) 06/30/2019   Pulmonary embolism ([place]) 05/2019   Hypernatremia    Asthma, chronic    DKA (diabetic ketoacidoses) 11/27/2014   Diabetes mellitus, new onset ([place])    Hyperglycemic hyperosmolar nonketotic coma ([place])    Essential hypertension     Past Surgical History:  Procedure Laterality Date   BILATERAL CARPAL TUNNEL RELEASE     LEFT HEART CATH AND CORONARY ANGIOGRAPHY N/A 03/27/2021   Procedure: LEFT HEART CATH AND CORONARY ANGIOGRAPHY;  Surgeon: Nigel Mormon, MD;  Location: [facility];  Service: Cardiovascular;  Laterality: N/A;   TEE WITHOUT CARDIOVERSION N/A 03/28/2020   Procedure: TRANSESOPHAGEAL ECHOCARDIOGRAM (TEE);  Surgeon: Adrian Prows, MD;  Location: [facility];  Service: Cardiovascular;  Laterality: N/A;    OB History   No obstetric history on file.      Home Medications    Prior to Admission  medications   Medication Sig Start Date End Date Taking? Authorizing Provider  benzonatate (TESSALON) 100 MG capsule Take 1 capsule (100 mg total) by mouth every 8 (eight) hours as needed for cough. 06/27/21  Yes Jadyn Barge, Michele Rockers, FNP  apixaban (ELIQUIS) 5 MG TABS tablet Take 1 tablet (5 mg total) by mouth 2 (two) times daily. Resume on 11/2 morning 03/27/21   Patwardhan, Reynold Bowen, MD  Baclofen 5 MG TABS Take 5-10 mg by mouth daily as needed (Migraines). 08/09/19   [provider]  Blood Glucose Monitoring Suppl (ACCU-CHEK NANO SMARTVIEW) W/DEVICE KIT 1 Device by Does not apply route 4 (four) times daily - after meals and at bedtime. 11/29/14   Frazier Richards, MD  carvedilol (COREG) 25 MG tablet Take 2 tablets (50 mg total) by mouth 2 (two) times daily with a meal. 03/05/21   Cantwell, Celeste C, PA-C  Continuous Blood Gluc Sensor (DEXCOM G6 SENSOR) MISC SMARTSIG:1 Topical Every 10 Days 10/18/20   [provider]  Continuous Blood Gluc Transmit (DEXCOM G6 TRANSMITTER) MISC See admin instructions. 08/16/20   [provider]  Dulaglutide 1.5 MG/0.5ML SOPN Inject 1.5 mg into the skin once a week. 06/05/20   [provider]  escitalopram (LEXAPRO) 10 MG tablet Take 10 mg by mouth daily. 03/01/20   [provider]  fexofenadine (ALLEGRA) 180 MG tablet Take 180  mg by mouth daily.    [provider]  FLOVENT HFA 110 MCG/ACT inhaler Inhale 2 puffs into the lungs 2 (two) times daily. 04/26/21   [provider]  fluticasone (FLONASE) 50 MCG/ACT nasal spray Place 2 sprays into both nostrils daily as needed for allergies.  09/22/14   [provider]  furosemide (LASIX) 40 MG tablet Take 1 tablet (40 mg total) by mouth daily as needed for fluid or edema. Patient taking differently: Take 40 mg by mouth daily. 03/05/21 03/05/22  Cantwell, Celeste C, PA-C  glucose blood (ACCU-CHEK SMARTVIEW) test strip Check sugar 6 x daily Patient taking differently: Check  sugar 6 x daily. Uses true test instead 11/29/14   Abram Sander, MD  Insulin Disposable Pump (OMNIPOD DASH PODS, GEN 4,) MISC 200 Units every other day. Humalog 02/23/21   [provider]  Insulin Infusion Pump (DEXCOM G6 BASAL-IQ INSULIN PMP) DEVI by Does not apply route.    [provider]  Insulin Pen Needle (INSUPEN PEN NEEDLES) 32G X 4 MM MISC BD Pen Needles- brand specific. Inject insulin via insulin pen daily 11/29/14   Abram Sander, MD  ivabradine (CORLANOR) 5 MG TABS tablet Take 1 tablet (5 mg total) by mouth 2 (two) times daily with a meal. 04/12/21   Cantwell, Bastian C, PA-C  Lancet Devices (ACCU-CHEK [place]) lancets Use as instructed for blood glucose checks four times daily, before meals and at bedtime 11/29/14   Abram Sander, MD  medroxyPROGESTERone (DEPO-PROVERA) 150 MG/ML injection Inject 150 mg into the muscle every 3 (three) months.    [provider]  montelukast (SINGULAIR) 10 MG tablet Take 10 mg by mouth at bedtime.    [provider]  nortriptyline (PAMELOR) 10 MG capsule Take 20 mg by mouth at bedtime.    [provider]  NURTEC 75 MG TBDP Take 1 tablet by mouth daily. 04/02/21   [provider]  ondansetron (ZOFRAN) 4 MG tablet Take 1 tablet (4 mg total) by mouth every 8 (eight) hours as needed for nausea or vomiting. 07/03/19   Leroy Sea, MD  pantoprazole (PROTONIX) 40 MG tablet Take 40 mg by mouth daily.    [provider]  rosuvastatin (CRESTOR) 20 MG tablet Take 1 tablet (20 mg total) by mouth daily at 6 PM. 03/30/20   Elgergawy, Leana Roe, MD  sacubitril-valsartan (ENTRESTO) 97-103 MG Take 1 tablet by mouth 2 (two) times daily. 04/27/21   Cantwell, Celeste C, PA-C  spironolactone (ALDACTONE) 25 MG tablet Take 0.5 tablets (12.5 mg total) by mouth daily. 04/27/21 04/22/22  Cantwell, Celeste C, PA-C  topiramate (TOPAMAX) 100 MG tablet Take 100 mg by mouth 2 (two) times daily.    [provider]   TRUEPLUS LANCETS 33G MISC USE TO CHECK BLOOD GLUCOSE QID - AFTER MEALS AND AT BEDTIME 12/22/14   [provider]  VENTOLIN HFA 108 (90 BASE) MCG/ACT inhaler Use as directed 2 puffs in the mouth or throat 4 (four) times daily as needed for shortness of breath.  12/21/14   [provider]    Family History Family History  Problem Relation Age of Onset   Hypertension Mother    Thyroid disease Mother    Hypertension Father    Stroke Father 57   Diabetes Father    Sleep apnea Father    CVA Paternal Uncle 65   Heart failure Paternal Grandfather     Social History Social History   Tobacco Use  Smoking status: Never   Smokeless tobacco: Never  Vaping Use   Vaping Use: Never used  Substance Use Topics   Alcohol use: Yes    Alcohol/week: 0.0 standard drinks    Comment: occasional    Drug use: No     Allergies   Lisinopril, Metformin, Farxiga [dapagliflozin], Ibuprofen, and Ketorolac   Review of Systems Review of Systems Per HPI  Physical Exam Triage Vital Signs ED Triage Vitals  Enc Vitals Group     BP 06/27/21 1114 (!) 138/103     Pulse Rate 06/27/21 1114 99     Resp 06/27/21 1114 18     Temp 06/27/21 1114 (!) 100.7 F (38.2 C)     Temp Source 06/27/21 1114 Oral     SpO2 06/27/21 1114 99 %     Weight --      Height --      Head Circumference --      Peak Flow --      Pain Score 06/27/21 1112 0     Pain Loc --      Pain Edu? --      Excl. in [place]? --    No data found.  Updated Vital Signs BP (!) 138/103 (BP Location: Right Arm)    Pulse 99    Temp (!) 100.7 F (38.2 C) (Oral)    Resp 18    SpO2 99%   Visual Acuity Right Eye Distance:   Left Eye Distance:   Bilateral Distance:    Right Eye Near:   Left Eye Near:    Bilateral Near:     Physical Exam Constitutional:      General: She is not in acute distress.    Appearance: Normal appearance. She is not toxic-appearing or diaphoretic.  HENT:     Head: Normocephalic and atraumatic.      Right Ear: Tympanic membrane and ear canal normal.     Left Ear: Tympanic membrane and ear canal normal.     Nose: Congestion present.     Mouth/Throat:     Mouth: Mucous membranes are moist.     Pharynx: Posterior oropharyngeal erythema present.  Eyes:     Extraocular Movements: Extraocular movements intact.     Conjunctiva/sclera: Conjunctivae normal.     Pupils: Pupils are equal, round, and reactive to light.  Cardiovascular:     Rate and Rhythm: Normal rate and regular rhythm.     Pulses: Normal pulses.     Heart sounds: Normal heart sounds.  Pulmonary:     Effort: Pulmonary effort is normal. No respiratory distress.     Breath sounds: Normal breath sounds. No stridor. No wheezing, rhonchi or rales.  Abdominal:     General: Abdomen is flat. Bowel sounds are normal.     Palpations: Abdomen is soft.  Musculoskeletal:        General: Normal range of motion.     Cervical back: Normal range of motion.  Skin:    General: Skin is warm and dry.  Neurological:     General: No focal deficit present.     Mental Status: She is alert and oriented to person, place, and time. Mental status is at baseline.  Psychiatric:        Mood and Affect: Mood normal.        Behavior: Behavior normal.     UC Treatments / Results  Labs (all labs ordered are listed, but only abnormal results are displayed) Labs Reviewed  CULTURE, GROUP  A STREP ([place])  NOVEL CORONAVIRUS, NAA  POCT INFLUENZA A/B  POCT RAPID STREP A (OFFICE)    EKG   Radiology No results found.  Procedures Procedures (including critical care time)  Medications Ordered in UC Medications - No data to display  Initial Impression / Assessment and Plan / UC Course  I have reviewed the triage vital signs and the nursing notes.  Pertinent labs & imaging results that were available during my care of the patient were reviewed by me and considered in my medical decision making (see chart for details).     Patient  presents with symptoms likely from a viral upper respiratory infection. Differential includes bacterial pneumonia, sinusitis, allergic rhinitis, COVID-19, flu. Do not suspect underlying cardiopulmonary process. Symptoms seem unlikely related to ACS, CHF or COPD exacerbations, pneumonia, pneumothorax. Patient is nontoxic appearing and not in need of emergent medical intervention.  Rapid flu and rapid strep test were negative.  Throat culture and COVID-19 viral swab are pending.  Recommended symptom control with over the counter medications.  Patient sent benzonatate.  Return if symptoms fail to improve in 1-2 weeks or you develop shortness of breath, chest pain, severe headache. Patient states understanding and is agreeable.  Discharged with PCP followup.  Final Clinical Impressions(s) / UC Diagnoses   Final diagnoses:  Viral upper respiratory tract infection with cough  Sore throat     Discharge Instructions      Your rapid flu and rapid strep test were negative.  It appears that you have a viral upper respiratory infection that should resolve in the next few days on its own.  You have been prescribed cough medication to take as needed.  COVID-19 test is pending.  We will call if it is positive.     ED Prescriptions     Medication Sig Dispense Auth. Provider   benzonatate (TESSALON) 100 MG capsule Take 1 capsule (100 mg total) by mouth every 8 (eight) hours as needed for cough. 21 capsule [place], Michele Rockers, [place]      PDMP not reviewed this encounter.   Teodora Medici,  06/27/21 [phone]

## 2021-06-27 NOTE — Discharge Instructions (Signed)
Your rapid flu and rapid strep test were negative.  It appears that you have a viral upper respiratory infection that should resolve in the next few days on its own.  You have been prescribed cough medication to take as needed.  COVID-19 test is pending.  We will call if it is positive.

## 2021-06-27 NOTE — ED Triage Notes (Signed)
Pt c/o body aches, low grade fever at home, cough, nasal drainage, earache, headache, diarrhea   Denies nausea, vomiting, constipation  Onset yesterday. Concerned for covid.

## 2021-06-28 LAB — NOVEL CORONAVIRUS, NAA: SARS-CoV-2, NAA: DETECTED — AB

## 2021-06-28 LAB — SARS-COV-2, NAA 2 DAY TAT

## 2021-06-29 LAB — CULTURE, GROUP A STREP (THRC)

## 2021-08-07 ENCOUNTER — Other Ambulatory Visit: Payer: Self-pay

## 2021-08-07 DIAGNOSIS — I5022 Chronic systolic (congestive) heart failure: Secondary | ICD-10-CM

## 2021-08-07 DIAGNOSIS — I420 Dilated cardiomyopathy: Secondary | ICD-10-CM

## 2021-08-07 MED ORDER — IVABRADINE HCL 5 MG PO TABS: 5.0000 mg | ORAL_TABLET | Freq: Two times a day (BID) | ORAL | 3 refills | Status: DC

## 2021-08-13 ENCOUNTER — Encounter: Payer: Self-pay | Admitting: Student

## 2021-08-14 ENCOUNTER — Other Ambulatory Visit: Payer: Self-pay

## 2021-08-14 DIAGNOSIS — I420 Dilated cardiomyopathy: Secondary | ICD-10-CM

## 2021-08-14 DIAGNOSIS — I5022 Chronic systolic (congestive) heart failure: Secondary | ICD-10-CM

## 2021-08-14 MED ORDER — IVABRADINE HCL 5 MG PO TABS: 5.0000 mg | ORAL_TABLET | Freq: Two times a day (BID) | ORAL | 3 refills | Status: DC

## 2021-09-09 ENCOUNTER — Other Ambulatory Visit: Payer: Self-pay

## 2021-09-09 ENCOUNTER — Encounter (HOSPITAL_COMMUNITY): Payer: Self-pay

## 2021-09-09 ENCOUNTER — Emergency Department (HOSPITAL_COMMUNITY): Payer: 59

## 2021-09-09 ENCOUNTER — Emergency Department (HOSPITAL_COMMUNITY)
Admission: EM | Admit: 2021-09-09 | Discharge: 2021-09-09 | Disposition: A | Discharge status: Home / Self Care | Payer: 59 | Attending: Emergency Medicine | Admitting: Emergency Medicine

## 2021-09-09 DIAGNOSIS — R110 Nausea: Secondary | ICD-10-CM | POA: Insufficient documentation

## 2021-09-09 DIAGNOSIS — R109 Unspecified abdominal pain: Secondary | ICD-10-CM

## 2021-09-09 DIAGNOSIS — Z7901 Long term (current) use of anticoagulants: Secondary | ICD-10-CM | POA: Insufficient documentation

## 2021-09-09 LAB — COMPREHENSIVE METABOLIC PANEL
ALT: 26 U/L (ref 0–44)
AST: 18 U/L (ref 15–41)
Albumin: 3.5 g/dL (ref 3.5–5.0)
Alkaline Phosphatase: 65 U/L (ref 38–126)
Anion gap: 7 (ref 5–15)
BUN: 9 mg/dL (ref 6–20)
CO2: 24 mmol/L (ref 22–32)
Calcium: 8.8 mg/dL — ABNORMAL LOW (ref 8.9–10.3)
Chloride: 108 mmol/L (ref 98–111)
Creatinine, Ser: 0.92 mg/dL (ref 0.44–1.00)
GFR, Estimated: >60 mL/min (ref >60)
Glucose, Bld: 244 mg/dL — ABNORMAL HIGH (ref 70–99)
Potassium: 3.6 mmol/L (ref 3.5–5.1)
Sodium: 139 mmol/L (ref 135–145)
Total Bilirubin: 0.6 mg/dL (ref 0.3–1.2)
Total Protein: 6.5 g/dL (ref 6.5–8.1)

## 2021-09-09 LAB — CBC WITH DIFFERENTIAL/PLATELET
Abs Immature Granulocytes: 0.02 10*3/uL (ref 0.00–0.07)
Basophils Absolute: 0.1 10*3/uL (ref 0.0–0.1)
Basophils Relative: 1 %
Eosinophils Absolute: 0.1 10*3/uL (ref 0.0–0.5)
Eosinophils Relative: 1 %
HCT: 40.3 % (ref 36.0–46.0)
Hemoglobin: 13.2 g/dL (ref 12.0–15.0)
Immature Granulocytes: 0 %
Lymphocytes Relative: 31 %
Lymphs Abs: 3 10*3/uL (ref 0.7–4.0)
MCH: 29.9 pg (ref 26.0–34.0)
MCHC: 32.8 g/dL (ref 30.0–36.0)
MCV: 91.4 fL (ref 80.0–100.0)
Monocytes Absolute: 0.5 10*3/uL (ref 0.1–1.0)
Monocytes Relative: 6 %
Neutro Abs: 5.8 10*3/uL (ref 1.7–7.7)
Neutrophils Relative %: 61 %
Platelets: 264 10*3/uL (ref 150–400)
RBC: 4.41 MIL/uL (ref 3.87–5.11)
RDW: 13.6 % (ref 11.5–15.5)
WBC: 9.5 10*3/uL (ref 4.0–10.5)
nRBC: 0 % (ref 0.0–0.2)

## 2021-09-09 LAB — URINALYSIS, ROUTINE W REFLEX MICROSCOPIC
Bilirubin Urine: NEGATIVE
Glucose, UA: NEGATIVE mg/dL
Hgb urine dipstick: NEGATIVE
Ketones, ur: NEGATIVE mg/dL
Leukocytes,Ua: NEGATIVE
Nitrite: NEGATIVE
Protein, ur: NEGATIVE mg/dL
Specific Gravity, Urine: 1.027 (ref 1.005–1.030)
pH: 5 (ref 5.0–8.0)

## 2021-09-09 LAB — LIPASE, BLOOD: Lipase: 35 U/L (ref 11–51)

## 2021-09-09 LAB — POC URINE PREG, ED: Preg Test, Ur: NEGATIVE

## 2021-09-09 MED ORDER — IOHEXOL 300 MG/ML  SOLN
100.0000 mL | Freq: Once | INTRAMUSCULAR | Status: AC | PRN
  Administered 2021-09-09: 100 mL via INTRAVENOUS

## 2021-09-09 MED ORDER — ONDANSETRON HCL 4 MG/2ML IJ SOLN
4.0000 mg | Freq: Once | INTRAMUSCULAR | Status: AC
  Administered 2021-09-09: 4 mg via INTRAVENOUS
  Filled 2021-09-09: qty 2

## 2021-09-09 MED ORDER — ONDANSETRON 4 MG PO TBDP: 4.0000 mg | ORAL_TABLET | Freq: Three times a day (TID) | ORAL | 0 refills | Status: AC | PRN

## 2021-09-09 MED ORDER — MORPHINE SULFATE (PF) 4 MG/ML IV SOLN
4.0000 mg | Freq: Once | INTRAVENOUS | Status: AC
  Administered 2021-09-09: 4 mg via INTRAVENOUS
  Filled 2021-09-09: qty 1

## 2021-09-09 MED ORDER — HYDROCODONE-ACETAMINOPHEN 5-325 MG PO TABS: 2.0000 | ORAL_TABLET | ORAL | 0 refills | Status: DC | PRN

## 2021-09-09 NOTE — Discharge Instructions (Addendum)
Your work-up today was reassuring.  Did not show any concern for kidney stones, or any abdominal infection leading to your symptoms.  Your urine does not show any evidence of UTI.  I have sent a few doses of pain medication to the pharmacy for you to use for breakthrough pain until you follow-up with your PCP.  If you have worsening symptoms you can return to the emergency room. ?

## 2021-09-09 NOTE — ED Notes (Signed)
Patient transported to CT 

## 2021-09-09 NOTE — ED Triage Notes (Signed)
Patient complains of right flank pain x 2 days with movement, denies urinary symptoms, denies trauma ?

## 2021-09-09 NOTE — ED Provider Notes (Signed)
?[facility] ?Provider Note ? ? ?CSN: [id number] ?Arrival date & time: 09/09/21  1251 ? ?  ? ?History ? ?No chief complaint on file. ? ? ?Shelly Shields is a 41 y.o. female who presents to the ED today with complaint of gradual onset, constant, sharp, flank pain that began 10 days ago.  Patient also complains of associated nausea.  She reports history of renal cyst on right side prior to her having her stroke in 2021.  Has been taking over-the-counter medications without relief.  Denies any urinary symptoms.  No vomiting.  Denies any radiation of pain into abdomen.  No fevers or chills. ? ?Per chart review patient presented to the ED in November 2021 as a code stroke.  She was also complaining of some right flank pain at that time.  She had CT scan done which found abnormality to right kidney which was ultimately believed to be right renal infarct.  ? ?The history is provided by the patient and medical records.  ? ?  ? ?Home Medications ?Prior to Admission medications   ?Medication Sig Start Date End Date Taking? Authorizing Provider  ?apixaban (ELIQUIS) 5 MG TABS tablet Take 1 tablet (5 mg total) by mouth 2 (two) times daily. Resume on 11/2 morning 03/27/21   Patwardhan, Reynold Bowen, MD  ?Baclofen 5 MG TABS Take 5-10 mg by mouth daily as needed (Migraines). 08/09/19   [provider]  ?benzonatate (TESSALON) 100 MG capsule Take 1 capsule (100 mg total) by mouth every 8 (eight) hours as needed for cough. 06/27/21   Teodora Medici, FNP  ?Blood Glucose Monitoring Suppl (ACCU-CHEK NANO SMARTVIEW) W/DEVICE KIT 1 Device by Does not apply route 4 (four) times daily - after meals and at bedtime. 11/29/14   Frazier Richards, MD  ?carvedilol (COREG) 25 MG tablet Take 2 tablets (50 mg total) by mouth 2 (two) times daily with a meal. 03/05/21   Cantwell, Celeste C, PA-C  ?Continuous Blood Gluc Sensor (DEXCOM G6 SENSOR) MISC SMARTSIG:1 Topical Every 10 Days 10/18/20   [provider]   ?Continuous Blood Gluc Transmit (DEXCOM G6 TRANSMITTER) MISC See admin instructions. 08/16/20   [provider]  ?Dulaglutide 1.5 MG/0.5ML SOPN Inject 1.5 mg into the skin once a week. 06/05/20   [provider]  ?escitalopram (LEXAPRO) 10 MG tablet Take 10 mg by mouth daily. 03/01/20   [provider]  ?fexofenadine (ALLEGRA) 180 MG tablet Take 180 mg by mouth daily.    [provider]  ?FLOVENT HFA 110 MCG/ACT inhaler Inhale 2 puffs into the lungs 2 (two) times daily. 04/26/21   [provider]  ?fluticasone (FLONASE) 50 MCG/ACT nasal spray Place 2 sprays into both nostrils daily as needed for allergies.  09/22/14   [provider]  ?furosemide (LASIX) 40 MG tablet Take 1 tablet (40 mg total) by mouth daily as needed for fluid or edema. ?Patient taking differently: Take 40 mg by mouth daily. 03/05/21 03/05/22  Cantwell, Celeste C, PA-C  ?glucose blood (ACCU-CHEK SMARTVIEW) test strip Check sugar 6 x daily ?Patient taking differently: Check sugar 6 x daily. Uses true test instead 11/29/14   Frazier Richards, MD  ?Insulin Disposable Pump (OMNIPOD DASH PODS, GEN 4,) MISC 200 Units every other day. Humalog 02/23/21   [provider]  ?Insulin Infusion Pump (DEXCOM G6 BASAL-IQ INSULIN PMP) DEVI by Does not apply route.    [provider]  ?Insulin Pen Needle (INSUPEN PEN NEEDLES) 32G  X 4 MM MISC BD Pen Needles- brand specific. Inject insulin via insulin pen daily 11/29/14   Frazier Richards, MD  ?ivabradine (CORLANOR) 5 MG TABS tablet Take 1 tablet (5 mg total) by mouth 2 (two) times daily with a meal. 08/14/21   Cantwell, Celeste C, PA-C  ?Lancet Devices (ACCU-CHEK [place]) lancets Use as instructed for blood glucose checks four times daily, before meals and at bedtime 11/29/14   Frazier Richards, MD  ?medroxyPROGESTERone (DEPO-PROVERA) 150 MG/ML injection Inject 150 mg into the muscle every 3 (three) months.    [provider]  ?montelukast (SINGULAIR)  10 MG tablet Take 10 mg by mouth at bedtime.    [provider]  ?nortriptyline (PAMELOR) 10 MG capsule Take 20 mg by mouth at bedtime.    [provider]  ?NURTEC 75 MG TBDP Take 1 tablet by mouth daily. 04/02/21   [provider]  ?ondansetron (ZOFRAN) 4 MG tablet Take 1 tablet (4 mg total) by mouth every 8 (eight) hours as needed for nausea or vomiting. 07/03/19   Thurnell Lose, MD  ?pantoprazole (PROTONIX) 40 MG tablet Take 40 mg by mouth daily.    [provider]  ?rosuvastatin (CRESTOR) 20 MG tablet Take 1 tablet (20 mg total) by mouth daily at 6 PM. 03/30/20   Elgergawy, Silver Huguenin, MD  ?sacubitril-valsartan (ENTRESTO) 97-103 MG Take 1 tablet by mouth 2 (two) times daily. 04/27/21   Cantwell, Celeste C, PA-C  ?spironolactone (ALDACTONE) 25 MG tablet Take 0.5 tablets (12.5 mg total) by mouth daily. 04/27/21 04/22/22  Cantwell, Celeste C, PA-C  ?topiramate (TOPAMAX) 100 MG tablet Take 100 mg by mouth 2 (two) times daily.    [provider]  ?TRUEPLUS LANCETS 33G MISC USE TO CHECK BLOOD GLUCOSE QID - AFTER MEALS AND AT BEDTIME 12/22/14   [provider]  ?VENTOLIN HFA 108 (90 BASE) MCG/ACT inhaler Use as directed 2 puffs in the mouth or throat 4 (four) times daily as needed for shortness of breath.  12/21/14   [provider]  ?   ? ?Allergies    ?Lisinopril, Metformin, Farxiga [dapagliflozin], Ibuprofen, and Ketorolac   ? ?Review of Systems   ?Review of Systems  ?Constitutional:  Negative for chills and fever.  ?Gastrointestinal:  Positive for nausea. Negative for abdominal pain, constipation, diarrhea and vomiting.  ?Genitourinary:  Positive for flank pain. Negative for difficulty urinating, dysuria, frequency and hematuria.  ?All other systems reviewed and are negative. ? ?Physical Exam ?Updated Vital Signs ?BP 136/90 (BP Location: Left Arm)   Pulse 100   Temp 99.1 ?F (37.3 ?C) (Oral)   Resp 18   SpO2 100%  ? ?Physical Exam ?Vitals and nursing note  reviewed.  ?Constitutional:   ?   Appearance: She is not ill-appearing.  ?HENT:  ?   Head: Normocephalic and atraumatic.  ?Eyes:  ?   Conjunctiva/sclera: Conjunctivae normal.  ?Cardiovascular:  ?   Rate and Rhythm: Normal rate and regular rhythm.  ?   Pulses: Normal pulses.  ?Pulmonary:  ?   Effort: Pulmonary effort is normal.  ?   Breath sounds: Normal breath sounds. No wheezing, rhonchi or rales.  ?Abdominal:  ?   Palpations: Abdomen is soft.  ?   Tenderness: There is abdominal tenderness. There is right CVA tenderness. There is no guarding or rebound.  ?Musculoskeletal:  ?   Cervical back: Neck supple.  ?Skin: ?   General: Skin is warm and dry.  ?Neurological:  ?  Mental Status: She is alert.  ? ? ?ED Results / Procedures / Treatments   ?Labs ?(all labs ordered are listed, but only abnormal results are displayed) ?Labs Reviewed  ?COMPREHENSIVE METABOLIC PANEL - Abnormal; Notable for the following components:  ?    Result Value  ? Glucose, Bld 244 (*)   ? Calcium 8.8 (*)   ? All other components within normal limits  ?CBC WITH DIFFERENTIAL/PLATELET  ?LIPASE, BLOOD  ?URINALYSIS, ROUTINE W REFLEX MICROSCOPIC  ?POC URINE PREG, ED  ? ? ?EKG ?None ? ?Radiology ?No results found. ? ?Procedures ?Procedures  ? ? ?Medications Ordered in ED ?Medications  ?ondansetron (ZOFRAN) injection 4 mg (4 mg Intravenous Given 09/09/21 1457)  ?morphine (PF) 4 MG/ML injection 4 mg (4 mg Intravenous Given 09/09/21 1458)  ?iohexol (OMNIPAQUE) 300 MG/ML solution 100 mL (100 mLs Intravenous Contrast Given 09/09/21 1507)  ? ? ?ED Course/ Medical Decision Making/ A&P ?  ?                        ?Medical Decision Making ?41 year old female who presents to the ED today with complaint of atraumatic right flank pain that began 10 days ago with associated nausea.  On arrival to the ED today vitals are stable.  He is noted to have right CVA tenderness palpation as well as right upper quadrant tenderness palpation however she denies any abdominal pain  during initial evaluation.  She does have history of renal infarct on same side when she presented for her stroke in November 2021.  No strokelike symptoms at this time.  She did have lab work done while

## 2021-09-09 NOTE — ED Provider Notes (Addendum)
41 year old female presents today for evaluation of 10-day duration of right flank pain associated with nausea without vomiting.  She denies dysuria, vaginal discharge, vaginal bleeding, hematuria.  Denies any known injury over this timeframe.  Patient has taken Tylenol without relief.  States she cannot tolerate NSAIDs and has been told by her PCP not to take NSAIDs.  At the time of signout patient is awaiting CT abdomen pelvis with contrast.  If this is negative patient is appropriate for discharge to follow-up with PCP. ? ?Physical Exam  ?BP 136/90 (BP Location: Left Arm)   Pulse 100   Temp 99.1 ?F (37.3 ?C) (Oral)   Resp 18   SpO2 100%  ? ? ? ?Procedures  ?Procedures ? ?ED Course / MDM  ?  ?Medical Decision Making ?Amount and/or Complexity of Data Reviewed ?Radiology: ordered. ? ?Risk ?Prescription drug management. ? ? ?CT abdomen pelvis with contrast negative for acute findings.  UA without evidence of UTI.  Reported mild improvement with pain medication she received earlier.  Patient is appropriate for discharge to follow-up with her PCP.  She appears comfortable and without acute distress on exam.  We will provide her a few doses of Norco until she follows up with PCP. ? ? ? ? ?  ?Marita [place], PA-C ?09/09/21 1737 ? ?  ?Marita [place], PA-C ?09/09/21 1737 ? ?  ?Glendora Score, MD ?09/09/21 2043 ? ?

## 2021-09-09 NOTE — ED Notes (Signed)
Patient Alert and oriented to baseline. Stable and ambulatory to baseline. Patient verbalized understanding of the discharge instructions.  Patient belongings were taken by the patient.   

## 2021-09-09 NOTE — ED Provider Triage Note (Signed)
Emergency Medicine Provider Triage Evaluation Note ? ?Shelly Shields , a 41 y.o. female  was evaluated in triage.  Pt complains of right low back/right flank pain onset 2 days ago denies any clear preceding event, describes aching constant pain worsened with movement and palpation no alleviating factors.  Of note patient reports history of CVA currently on Eliquis ? ?Review of Systems  ?Positive: Back pain, nausea ?Negative: Fever, chills, fall, injury, abdominal pain, vomiting, dysuria/hematuria, numbness, weakness or any additional concerns. ? ?Physical Exam  ?BP 136/90 (BP Location: Left Arm)   Pulse 100   Temp 99.1 ?F (37.3 ?C) (Oral)   Resp 18   SpO2 100%  ?Gen:   Awake, no distress   ?Resp:  Normal effort \ ?MSK:   Moves extremities without difficulty  ?Other:  TTP of the right paraspinal muscles.  No midline spinal tenderness palpation.  No evidence of trauma.  No abdominal tenderness palpation ? ?Medical Decision Making  ?Medically screening exam initiated at 1:41 PM.  Appropriate orders placed.  Adrianne B Meiners was informed that the remainder of the evaluation will be completed by another provider, this initial triage assessment does not replace that evaluation, and the importance of remaining in the ED until their evaluation is complete. ? ? ? ?Note: Portions of this report may have been transcribed using voice recognition software. Every effort was made to ensure accuracy; however, inadvertent computerized transcription errors may still be present. ? ?  ?Bill Salinas, PA-C ?09/09/21 1345 ? ?

## 2021-09-19 NOTE — Progress Notes (Signed)
?Primary Physician/Referring:  [name], MD ? ?Patient ID: Shelly Shields, female    DOB: 03-18-1981, 41 y.o.   MRN: [id number] ? ?Chief Complaint  ?Patient presents with  ? Shortness of Breath  ? swelling  ? ?HPI:   ? ?Shelly Shields  is a 41 y.o. African-American female with hypertension, hyperlipidemia, uncontrolled diabetes mellitus, obesity and obstructive sleep apnea on CPAP and history of Covid pneumonia in January 2021 and also had pulmonary embolism during Covid infection.  She is also being evaluated for vasculitis. 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  Patient underwent nuclear stress test which revealed large medium intensity fixed perfusion defect, suggestive of ischemic etiology for cardiomyopathy.  She subsequently underwent cardiac catheterization which revealed no significant CAD, therefore myopathy is nonischemic. ? ?Notably in the past patient has been unable tolerate [place] due to recurrent yeast infections. ? ?Patient presents today for urgent visit at her request with complaints of dyspnea on exertion and ankle swelling for the last 2 weeks.  Denies orthopnea, PND.  She is also noted episodes of lightheadedness over the last 2 weeks, primarily when walking.  Patient blood pressure and heart rate have been well controlled at home.  For the last 1 week patient has been taking Lasix 40 mg once daily.  Upon further questioning patient states she has no leg edema in the mornings when she wakes up, however by the end of the day her ankles are swollen. ? ?Of note patient's weight has trended up approximately 9 pounds since December. She admits to some dietary non-compliance.  ? ?Past Medical History:  ?Diagnosis Date  ? Asthma   ? COVID-19 long hauler   ? Diabetes mellitus without complication ([place])   ? Hypertension   ? Migraines   ? Obesity   ? Pulmonary embolism ([place]) 05/2019  ? with  COVID  ? Sleep apnea   ? ?Past Surgical History:  ?Procedure Laterality Date  ? BILATERAL CARPAL TUNNEL RELEASE    ? LEFT HEART CATH AND CORONARY ANGIOGRAPHY N/A 03/27/2021  ? Procedure: LEFT HEART CATH AND CORONARY ANGIOGRAPHY;  Surgeon: Nigel Mormon, MD;  Location: [facility];  Service: Cardiovascular;  Laterality: N/A;  ? TEE WITHOUT CARDIOVERSION N/A 03/28/2020  ? Procedure: TRANSESOPHAGEAL ECHOCARDIOGRAM (TEE);  Surgeon: Adrian Prows, MD;  Location: [facility];  Service: Cardiovascular;  Laterality: N/A;  ? ?Family History  ?Problem Relation Age of Onset  ? Hypertension Mother   ? Thyroid disease Mother   ? Hypertension Father   ? Stroke Father 58  ? Diabetes Father   ? Sleep apnea Father   ? Aneurysm Paternal Aunt 68  ? CVA Paternal Uncle 12  ? Heart failure Paternal Grandfather   ?  ?Social History  ? ?Tobacco Use  ? Smoking status: Never  ? Smokeless tobacco: Never  ?Substance Use Topics  ? Alcohol use: Yes  ?  Alcohol/week: 0.0 standard drinks  ?  Comment: occasional   ? ?Marital Status: Single  ? ?ROS  ?Review of Systems  ?Constitutional: Negative for malaise/fatigue and weight gain.  ?Cardiovascular:  Positive for dyspnea on exertion (mild) and leg swelling (ankles, worse at end of the day). Negative for chest pain, claudication, near-syncope, orthopnea, palpitations, paroxysmal nocturnal dyspnea and syncope.  ?Respiratory:  Negative for shortness of breath.   ?Hematologic/Lymphatic: Does not bruise/bleed easily.  ?Gastrointestinal:  Negative for melena.  ?Neurological:  Negative for  dizziness and weakness.  ? ?Objective  ?Blood pressure 100/70, pulse 74, temperature (!) 97.4 ?F (36.3 ?C), temperature source Temporal, resp. rate 17, height $RemoveBe'5\' 1"'OULJTuxFP$  (1.549 m), weight 243 lb (110.2 kg), SpO2 100 %.  ? ?  09/20/2021  ?  8:30 AM 09/09/2021  ?  5:32 PM 09/09/2021  ?  1:07 PM  ?Vitals with BMI  ?Height $Remove'5\' 1"'MjHQqPR$     ?Weight 243 lbs    ?BMI 45.94    ?Systolic 244 628 638  ?Diastolic 70 58 90  ?Pulse 74 82 100   ?  ? ? Physical Exam ?Vitals reviewed.  ?Constitutional:   ?   Appearance: She is obese.  ?Cardiovascular:  ?   Rate and Rhythm: Normal rate and regular rhythm.  ?   Pulses: Intact distal pulses.  ?   Heart sounds: S1 normal and S2 normal. No murmur heard. ?  No gallop.  ?   Comments: No JVD.  ?Pulmonary:  ?   Effort: Pulmonary effort is normal. No respiratory distress.  ?   Breath sounds: No wheezing, rhonchi or rales.  ?Musculoskeletal:  ?   Right lower leg: Edema (trace, ankle) present.  ?   Left lower leg: Edema (trace, ankle) present.  ?Skin: ?   Comments: Wounds on anterior shins bilaterally well healed  ?Neurological:  ?   Mental Status: She is alert.  ?   Cranial Nerves: No cranial nerve deficit.  ?   Gait: Gait (Essentially normal) normal.  ? ? ?Laboratory examination:  ? ?Recent Labs  ?  03/19/21 ?1405 09/09/21 ?1347  ?NA 143 139  ?K 3.7 3.6  ?CL 114* 108  ?CO2 18* 24  ?GLUCOSE 109* 244*  ?BUN 10 9  ?CREATININE 1.01* 0.92  ?CALCIUM 9.0 8.8*  ?GFRNONAA  --  >60  ? ?estimated creatinine clearance is 92.5 mL/min (by C-G formula based on SCr of 0.92 mg/dL).  ? ?  Latest Ref Rng & Units 09/09/2021  ?  1:47 PM 03/19/2021  ?  2:05 PM 07/07/2020  ?  3:59 PM  ?CMP  ?Glucose 70 - 99 mg/dL 244   109   116    ?BUN 6 - 20 mg/dL $Remove'9   10   10    'FbKFeqm$ ?Creatinine 0.44 - 1.00 mg/dL 0.92   1.01   0.95    ?Sodium 135 - 145 mmol/L 139   143   142    ?Potassium 3.5 - 5.1 mmol/L 3.6   3.7   3.9    ?Chloride 98 - 111 mmol/L 108   114   113    ?CO2 22 - 32 mmol/L $RemoveB'24   18   16    'SvVOxkuQ$ ?Calcium 8.9 - 10.3 mg/dL 8.8   9.0   9.0    ?Total Protein 6.5 - 8.1 g/dL 6.5      ?Total Bilirubin 0.3 - 1.2 mg/dL 0.6      ?Alkaline Phos 38 - 126 U/L 65      ?AST 15 - 41 U/L 18      ?ALT 0 - 44 U/L 26      ? ? ?  Latest Ref Rng & Units 09/09/2021  ?  1:47 PM 03/19/2021  ?  2:05 PM 03/29/2020  ?  2:29 AM  ?CBC  ?WBC 4.0 - 10.5 K/uL 9.5   8.3   10.4    ?Hemoglobin 12.0 - 15.0 g/dL 13.2   13.3   12.7    ?Hematocrit 36.0 - 46.0 % 40.3  41.1   38.3    ?Platelets  150 - 400 K/uL 264   276   245    ? ? ?Lipid Panel ?No results for input(s): CHOL, TRIG, LDLCALC, VLDL, HDL, CHOLHDL, LDLDIRECT in the last 8760 hours. ? ? ?HEMOGLOBIN A1C ?Lab Results  ?Component Value Date  ? HGBA1C 11.2 (H) 03/27/2020  ? MPG 274.74 03/27/2020  ? ?TSH ?No results for input(s): TSH in the last 8760 hours. ? ?External labs:  ?None  ?Allergies  ? ?Allergies  ?Allergen Reactions  ? Lisinopril Cough  ? Metformin Diarrhea  ? Wilder Glade [Dapagliflozin]   ?  Recurrent yeast infections  ? Ibuprofen Hives  ? Ketorolac Hives  ?  ?Medications Prior to Visit:  ? ?Outpatient Medications Prior to Visit  ?Medication Sig Dispense Refill  ? apixaban (ELIQUIS) 5 MG TABS tablet Take 1 tablet (5 mg total) by mouth 2 (two) times daily. Resume on 11/2 morning 60 tablet 0  ? Baclofen 5 MG TABS Take 5-10 mg by mouth daily as needed (Migraines).    ? Blood Glucose Monitoring Suppl (ACCU-CHEK NANO SMARTVIEW) W/DEVICE KIT 1 Device by Does not apply route 4 (four) times daily - after meals and at bedtime. 1 kit 0  ? carvedilol (COREG) 25 MG tablet Take 2 tablets (50 mg total) by mouth 2 (two) times daily with a meal. 180 tablet 3  ? Continuous Blood Gluc Sensor (DEXCOM G6 SENSOR) MISC SMARTSIG:1 Topical Every 10 Days    ? Continuous Blood Gluc Transmit (DEXCOM G6 TRANSMITTER) MISC See admin instructions.    ? Dulaglutide 1.5 MG/0.5ML SOPN Inject 1.5 mg into the skin once a week.    ? escitalopram (LEXAPRO) 10 MG tablet Take 10 mg by mouth daily.    ? fexofenadine (ALLEGRA) 180 MG tablet Take 180 mg by mouth daily.    ? FLOVENT HFA 110 MCG/ACT inhaler Inhale 2 puffs into the lungs 2 (two) times daily.    ? fluticasone (FLONASE) 50 MCG/ACT nasal spray Place 2 sprays into both nostrils daily as needed for allergies.   3  ? furosemide (LASIX) 40 MG tablet Take 1 tablet (40 mg total) by mouth daily as needed for fluid or edema. (Patient taking differently: Take 40 mg by mouth daily.) 90 tablet 3  ? glucose blood (ACCU-CHEK SMARTVIEW)  test strip Check sugar 6 x daily (Patient taking differently: Check sugar 6 x daily. Uses true test instead) 200 each 3  ? HUMALOG KWIKPEN 200 UNIT/ML KwikPen SMARTSIG:10-50 Unit(s) SUB-Q    ? [place]

## 2021-09-20 ENCOUNTER — Encounter: Payer: Self-pay | Admitting: Student

## 2021-09-20 ENCOUNTER — Ambulatory Visit: Payer: 59 | Admitting: Student

## 2021-09-20 VITALS — BP 100/70 | HR 74 | Temp 97.4°F | Resp 17 | Ht 61.0 in | Wt 243.0 lb

## 2021-09-20 DIAGNOSIS — I10 Essential (primary) hypertension: Secondary | ICD-10-CM

## 2021-09-20 DIAGNOSIS — I5022 Chronic systolic (congestive) heart failure: Secondary | ICD-10-CM

## 2021-09-20 DIAGNOSIS — R0609 Other forms of dyspnea: Secondary | ICD-10-CM

## 2021-09-20 DIAGNOSIS — I428 Other cardiomyopathies: Secondary | ICD-10-CM

## 2021-09-20 MED ORDER — ENTRESTO 97-103 MG PO TABS: 0.5000 | ORAL_TABLET | Freq: Two times a day (BID) | ORAL | 3 refills | Status: DC

## 2021-09-21 LAB — BASIC METABOLIC PANEL
BUN/Creatinine Ratio: 14 (ref 9–23)
BUN: 14 mg/dL (ref 6–24)
CO2: 19 mmol/L — ABNORMAL LOW (ref 20–29)
Calcium: 9.2 mg/dL (ref 8.7–10.2)
Chloride: 107 mmol/L — ABNORMAL HIGH (ref 96–106)
Creatinine, Ser: 0.98 mg/dL (ref 0.57–1.00)
Glucose: 149 mg/dL — ABNORMAL HIGH (ref 70–99)
Potassium: 4 mmol/L (ref 3.5–5.2)
Sodium: 140 mmol/L (ref 134–144)
eGFR: 74 mL/min/{1.73_m2} (ref >59)

## 2021-09-21 LAB — PRO B NATRIURETIC PEPTIDE: NT-Pro BNP: 114 pg/mL (ref 0–130)

## 2021-09-26 ENCOUNTER — Telehealth: Payer: Self-pay | Admitting: Student

## 2021-09-26 NOTE — Progress Notes (Signed)
Called and spoke with patient regarding her recent lab resutls. Patient stated that she had already spoken to CC.

## 2021-09-26 NOTE — Telephone Encounter (Signed)
Reviewed and discussed lab results, no evidence of volume overload with normal BNP.  Patient reports dyspnea and lightheadedness have resolved and swelling has significantly improved.  However she is having facial swelling due to tooth infection. ? ?Advised patient that she may proceed with tooth extraction with relatively low cardiovascular risk.  She may hold Eliquis prior to procedure for 1 to 2 days. ?

## 2021-10-03 NOTE — Progress Notes (Signed)
?Primary Physician/Referring:  [name], MD ? ?Patient ID: Shelly Shields, female    DOB: 09-06-1980, 41 y.o.   MRN: [id number] ? ?Chief Complaint  ?Patient presents with  ? Congestive Heart Failure  ? Follow-up  ?  2 weeks  ? ?HPI:   ? ?Shelly Shields  is a 41 y.o. African-American female with hypertension, hyperlipidemia, uncontrolled diabetes mellitus, obesity and obstructive sleep apnea on CPAP and history of Covid pneumonia in January 2021 and also had pulmonary embolism during Covid infection.  She is also being evaluated for vasculitis. 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  Patient underwent nuclear stress test which revealed large medium intensity fixed perfusion defect, suggestive of ischemic etiology for cardiomyopathy.  She subsequently underwent cardiac catheterization which revealed no significant CAD, therefore myopathy is nonischemic. ? ?Notably in the past patient has been unable tolerate [place] due to recurrent yeast infections. ? ?Patient presented 09/20/2021 for urgent visit with complaints of dyspnea on exertion and ankle swelling.  At that time patient's blood pressure was soft, therefore reduce Entresto to half tablet twice daily and advised her to take Lasix 40 mg twice daily for 3 days.  Repeat BMP remained stable and proBNP was within normal limits.  Patient now presents for 2-week follow-up.  Patient reports since last office visit dyspnea on exertion has improved.  She underwent tooth extraction without issue.  Blood pressure has also improved with reduced Entresto dosing.  She states ankle edema is relatively unchanged.  She continues to wear compression socks particularly while working.  Denies orthopnea, chest pain.  Lightheadedness is also essentially resolved. ? ?Past Medical History:  ?Diagnosis Date  ? Asthma   ? COVID-19 long hauler   ? Diabetes mellitus without  complication ([place])   ? Hypertension   ? Migraines   ? Obesity   ? Pulmonary embolism ([place]) 05/2019  ? with COVID  ? Sleep apnea   ? ?Past Surgical History:  ?Procedure Laterality Date  ? BILATERAL CARPAL TUNNEL RELEASE    ? LEFT HEART CATH AND CORONARY ANGIOGRAPHY N/A 03/27/2021  ? Procedure: LEFT HEART CATH AND CORONARY ANGIOGRAPHY;  Surgeon: Nigel Mormon, MD;  Location: [facility];  Service: Cardiovascular;  Laterality: N/A;  ? TEE WITHOUT CARDIOVERSION N/A 03/28/2020  ? Procedure: TRANSESOPHAGEAL ECHOCARDIOGRAM (TEE);  Surgeon: Adrian Prows, MD;  Location: [facility];  Service: Cardiovascular;  Laterality: N/A;  ? ?Family History  ?Problem Relation Age of Onset  ? Hypertension Mother   ? Thyroid disease Mother   ? Hypertension Father   ? Stroke Father 69  ? Diabetes Father   ? Sleep apnea Father   ? Aneurysm Paternal Aunt 68  ? CVA Paternal Uncle 48  ? Heart failure Paternal Grandfather   ?  ?Social History  ? ?Tobacco Use  ? Smoking status: Never  ? Smokeless tobacco: Never  ?Substance Use Topics  ? Alcohol use: Yes  ?  Alcohol/week: 0.0 standard drinks  ?  Comment: occasional   ? ?Marital Status: Single  ? ?ROS  ?Review of Systems  ?Constitutional: Negative for malaise/fatigue and weight gain.  ?Cardiovascular:  Positive for dyspnea on exertion (improved) and leg swelling (ankles, worse at end of the day). Negative for chest pain, claudication, near-syncope, orthopnea, palpitations, paroxysmal nocturnal dyspnea and syncope.  ?Respiratory:  Negative for shortness of breath.   ?Hematologic/Lymphatic: Does not bruise/bleed easily.  ?Gastrointestinal:  Negative for  melena.  ?Neurological:  Negative for dizziness and weakness.  ? ?Objective  ?Blood pressure 122/83, pulse 80, temperature 98.1 ?F (36.7 ?C), temperature source Temporal, resp. rate 16, height _0  (1.549 m), weight 242 lb (109.8 kg), SpO2 99 %.  ? ?  10/04/2021  ?  8:31 AM 09/20/2021  ?  8:30 AM 09/09/2021  ?  5:32 PM  ?Vitals with BMI   ?Height _1  _2    ?Weight 242 lbs 243 lbs   ?BMI 45.75 45.94   ?Systolic 615 379 432  ?Diastolic 83 70 58  ?Pulse 80 74 82  ?  ? ? Physical Exam ?Vitals reviewed.  ?Constitutional:   ?   Appearance: She is obese.  ?Cardiovascular:  ?   Rate and Rhythm: Normal rate and regular rhythm.  ?   Pulses: Intact distal pulses.  ?   Heart sounds: S1 normal and S2 normal. No murmur heard. ?  No gallop.  ?   Comments: No JVD.  ?Pulmonary:  ?   Effort: Pulmonary effort is normal. No respiratory distress.  ?   Breath sounds: No wheezing, rhonchi or rales.  ?Musculoskeletal:  ?   Right lower leg: Edema (trace, ankle) present.  ?   Left lower leg: Edema (trace, ankle) present.  ?   Comments: Compression stockings in place  ?Skin: ?   Comments: Wounds on anterior shins bilaterally well healed  ?Neurological:  ?   Mental Status: She is alert.  ?   Cranial Nerves: No cranial nerve deficit.  ?   Gait: Gait (Essentially normal) normal.  ? ? ?Laboratory examination:  ? ?Recent Labs  ?  03/19/21 ?1405 09/09/21 ?1347 09/20/21 ?0959  ?NA 143 139 140  ?K 3.7 3.6 4.0  ?CL 114* 108 107*  ?CO2 18* 24 19*  ?GLUCOSE 109* 244* 149*  ?BUN _3 ?CREATININE 1.01* 0.92 0.98  ?CALCIUM 9.0 8.8* 9.2  ?GFRNONAA  --  >60  --   ? ?estimated creatinine clearance is 86.6 mL/min (by C-G formula based on SCr of 0.98 mg/dL).  ? ?  Latest Ref Rng & Units 09/20/2021  ?  9:59 AM 09/09/2021  ?  1:47 PM 03/19/2021  ?  2:05 PM  ?CMP  ?Glucose 70 - 99 mg/dL 149   244   109    ?BUN 6 - 24 mg/dL _4 ?Creatinine 0.57 - 1.00 mg/dL 0.98   0.92   1.01    ?Sodium 134 - 144 mmol/L 140   139   143    ?Potassium 3.5 - 5.2 mmol/L 4.0   3.6   3.7    ?Chloride 96 - 106 mmol/L 107   108   114    ?CO2 20 - 29 mmol/L _5 ?Calcium 8.7 - 10.2 mg/dL 9.2   8.8   9.0    ?Total Protein 6.5 - 8.1 g/dL  6.5     ?Total Bilirubin 0.3 - 1.2 mg/dL  0.6     ?Alkaline Phos 38 - 126 U/L  65     ?AST 15 - 41 U/L  18     ?ALT 0 - 44 U/L  26     ? ? ?  Latest Ref Rng &  Units 09/09/2021  ?  1:47 PM 03/19/2021  ?  2:05 PM 03/29/2020  ?  2:29 AM  ?CBC  ?WBC 4.0 - 10.5 K/uL 9.5  8.3   10.4    ?Hemoglobin 12.0 - 15.0 g/dL 13.2   13.3   12.7    ?Hematocrit 36.0 - 46.0 % 40.3   41.1   38.3    ?Platelets 150 - 400 K/uL 264   276   245    ? ? ?Lipid Panel ?No results for input(s): CHOL, TRIG, LDLCALC, VLDL, HDL, CHOLHDL, LDLDIRECT in the last 8760 hours. ? ? ?HEMOGLOBIN A1C ?Lab Results  ?Component Value Date  ? HGBA1C 11.2 (H) 03/27/2020  ? MPG 274.74 03/27/2020  ? ?TSH ?No results for input(s): TSH in the last 8760 hours. ? ?BNP ?   ?Component Value Date/Time  ? BNP 51.1 07/02/2019 0500  ? ? ?ProBNP ?   ?Component Value Date/Time  ? PROBNP 114 09/20/2021 0959  ? ?External labs:  ?None  ? ?Allergies  ? ?Allergies  ?Allergen Reactions  ? Lisinopril Cough  ? Metformin Diarrhea  ? Wilder Glade [Dapagliflozin]   ?  Recurrent yeast infections  ? Ibuprofen Hives  ? Ketorolac Hives  ?  ?Medications Prior to Visit:  ? ?Outpatient Medications Prior to Visit  ?Medication Sig Dispense Refill  ? apixaban (ELIQUIS) 5 MG TABS tablet Take 1 tablet (5 mg total) by mouth 2 (two) times daily. Resume on 11/2 morning 60 tablet 0  ? Baclofen 5 MG TABS Take 5-10 mg by mouth daily as needed (Migraines).    ? Blood Glucose Monitoring Suppl (ACCU-CHEK NANO SMARTVIEW) W/DEVICE KIT 1 Device by Does not apply route 4 (four) times daily - after meals and at bedtime. 1 kit 0  ? carvedilol (COREG) 25 MG tablet Take 2 tablets (50 mg total) by mouth 2 (two) times daily with a meal. 180 tablet 3  ? Continuous Blood Gluc Sensor (DEXCOM G6 SENSOR) MISC SMARTSIG:1 Topical Every 10 Days    ? Continuous Blood Gluc Transmit (DEXCOM G6 TRANSMITTER) MISC See admin instructions.    ? Dulaglutide 1.5 MG/0.5ML SOPN Inject 1.5 mg into the skin once a week.    ? escitalopram (LEXAPRO) 10 MG tablet Take 10 mg by mouth daily.    ? fexofenadine (ALLEGRA) 180 MG tablet Take 180 mg by mouth daily.    ? FLOVENT HFA 110 MCG/ACT inhaler Inhale 2  puffs into the lungs 2 (two) times daily.    ? fluticasone (FLONASE) 50 MCG/ACT nasal spray Place 2 sprays into both nostrils daily as needed for allergies.   3  ? furosemide (LASIX) 40 MG tablet Take 1 tablet (40 mg

## 2021-10-04 ENCOUNTER — Ambulatory Visit: Payer: 59 | Admitting: Student

## 2021-10-04 ENCOUNTER — Encounter: Payer: Self-pay | Admitting: Student

## 2021-10-04 VITALS — BP 122/83 | HR 80 | Temp 98.1°F | Resp 16 | Ht 61.0 in | Wt 242.0 lb

## 2021-10-04 DIAGNOSIS — R0609 Other forms of dyspnea: Secondary | ICD-10-CM

## 2021-10-04 DIAGNOSIS — I428 Other cardiomyopathies: Secondary | ICD-10-CM

## 2021-10-04 DIAGNOSIS — I5022 Chronic systolic (congestive) heart failure: Secondary | ICD-10-CM

## 2021-10-26 ENCOUNTER — Ambulatory Visit: Payer: 59 | Admitting: Student

## 2021-12-11 ENCOUNTER — Emergency Department (HOSPITAL_COMMUNITY): Payer: 59

## 2021-12-11 ENCOUNTER — Emergency Department (HOSPITAL_COMMUNITY)
Admission: EM | Admit: 2021-12-11 | Discharge: 2021-12-11 | Disposition: A | Discharge status: Home / Self Care | Payer: 59 | Attending: Emergency Medicine | Admitting: Emergency Medicine

## 2021-12-11 ENCOUNTER — Other Ambulatory Visit: Payer: Self-pay

## 2021-12-11 DIAGNOSIS — R0602 Shortness of breath: Secondary | ICD-10-CM | POA: Diagnosis present

## 2021-12-11 DIAGNOSIS — R059 Cough, unspecified: Secondary | ICD-10-CM | POA: Diagnosis not present

## 2021-12-11 DIAGNOSIS — Z7901 Long term (current) use of anticoagulants: Secondary | ICD-10-CM | POA: Diagnosis not present

## 2021-12-11 DIAGNOSIS — I110 Hypertensive heart disease with heart failure: Secondary | ICD-10-CM | POA: Diagnosis not present

## 2021-12-11 DIAGNOSIS — Z79899 Other long term (current) drug therapy: Secondary | ICD-10-CM | POA: Diagnosis not present

## 2021-12-11 DIAGNOSIS — R600 Localized edema: Secondary | ICD-10-CM | POA: Diagnosis not present

## 2021-12-11 DIAGNOSIS — R7989 Other specified abnormal findings of blood chemistry: Secondary | ICD-10-CM | POA: Insufficient documentation

## 2021-12-11 DIAGNOSIS — E119 Type 2 diabetes mellitus without complications: Secondary | ICD-10-CM | POA: Diagnosis not present

## 2021-12-11 DIAGNOSIS — I5020 Unspecified systolic (congestive) heart failure: Secondary | ICD-10-CM | POA: Diagnosis not present

## 2021-12-11 DIAGNOSIS — Z794 Long term (current) use of insulin: Secondary | ICD-10-CM | POA: Diagnosis not present

## 2021-12-11 DIAGNOSIS — R42 Dizziness and giddiness: Secondary | ICD-10-CM | POA: Insufficient documentation

## 2021-12-11 DIAGNOSIS — Z20822 Contact with and (suspected) exposure to covid-19: Secondary | ICD-10-CM | POA: Diagnosis not present

## 2021-12-11 LAB — BASIC METABOLIC PANEL
Anion gap: 8 (ref 5–15)
BUN: 13 mg/dL (ref 6–20)
CO2: 21 mmol/L — ABNORMAL LOW (ref 22–32)
Calcium: 8.8 mg/dL — ABNORMAL LOW (ref 8.9–10.3)
Chloride: 109 mmol/L (ref 98–111)
Creatinine, Ser: 1.35 mg/dL — ABNORMAL HIGH (ref 0.44–1.00)
GFR, Estimated: 51 mL/min — ABNORMAL LOW (ref >60)
Glucose, Bld: 152 mg/dL — ABNORMAL HIGH (ref 70–99)
Potassium: 3.9 mmol/L (ref 3.5–5.1)
Sodium: 138 mmol/L (ref 135–145)

## 2021-12-11 LAB — CBC
HCT: 41.2 % (ref 36.0–46.0)
Hemoglobin: 13.2 g/dL (ref 12.0–15.0)
MCH: 29.5 pg (ref 26.0–34.0)
MCHC: 32 g/dL (ref 30.0–36.0)
MCV: 92 fL (ref 80.0–100.0)
Platelets: 295 10*3/uL (ref 150–400)
RBC: 4.48 MIL/uL (ref 3.87–5.11)
RDW: 13 % (ref 11.5–15.5)
WBC: 8.6 10*3/uL (ref 4.0–10.5)
nRBC: 0 % (ref 0.0–0.2)

## 2021-12-11 LAB — I-STAT BETA HCG BLOOD, ED (MC, WL, AP ONLY): I-stat hCG, quantitative: <5 m[IU]/mL (ref <5)

## 2021-12-11 LAB — RESP PANEL BY RT-PCR (FLU A&B, COVID) ARPGX2
Influenza A by PCR: NEGATIVE
Influenza B by PCR: NEGATIVE
SARS Coronavirus 2 by RT PCR: NEGATIVE

## 2021-12-11 LAB — TROPONIN I (HIGH SENSITIVITY)
Troponin I (High Sensitivity): 100 ng/L (ref <18)
Troponin I (High Sensitivity): 88 ng/L — ABNORMAL HIGH (ref <18)

## 2021-12-11 LAB — BRAIN NATRIURETIC PEPTIDE: B Natriuretic Peptide: 132 pg/mL — ABNORMAL HIGH (ref 0.0–100.0)

## 2021-12-11 MED ORDER — IOHEXOL 350 MG/ML SOLN
65.0000 mL | Freq: Once | INTRAVENOUS | Status: AC | PRN
  Administered 2021-12-11: 65 mL via INTRAVENOUS

## 2021-12-11 MED ORDER — SODIUM CHLORIDE 0.9 % IV BOLUS
500.0000 mL | Freq: Once | INTRAVENOUS | Status: AC
  Administered 2021-12-11: 500 mL via INTRAVENOUS

## 2021-12-11 NOTE — ED Notes (Signed)
Pt to CT scanner at this time 

## 2021-12-11 NOTE — ED Triage Notes (Signed)
Pt c/o constant dizziness, intermittent SHOB x "a couple days." Spoke to cardiologist, given option to increase lasix, but wanted to be "thoroughly evaluated."  Hx CVA, cardiomyopathy, CHF, HTN, asthma

## 2021-12-11 NOTE — ED Provider Notes (Signed)
[facility] EMERGENCY DEPARTMENT Provider Note   CSN: [id number] Arrival date & time: 12/11/21  1629     History  Chief Complaint  Patient presents with   Shortness of Breath   Dizziness    Shelly Shields is a 41 y.o. female.  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  Patient states that symptoms began 2 days ago.  She was at work when she felt hot and dizzy.  She states that they do not have air conditioning at work and felt that she was getting dizzy from the heat.  She then began having shortness of breath.  States that her symptoms got worse today so she presented to the emergency department.  She feels as if her chest is tight but denies chest pain, palpitations.  She does have a nonproductive cough which she states has been intermittent and preceding her acute symptoms.  She does have bilateral lower extremity swelling which she states is constant.  She denies any nausea, vomiting or diaphoresis.  States that she has been taking her medications as prescribed.  States that her previous PE in 2021 was due to [place].  Continues to be anticoagulated on Eliquis and has not missed any doses.   Shortness of Breath Associated symptoms: cough   Associated symptoms: no chest pain, no diaphoresis and no fever   Dizziness Associated symptoms: shortness of breath   Associated symptoms: no chest pain, no nausea and no palpitations        Home Medications Prior to Admission medications   Medication Sig Start Date End Date Taking? Authorizing Provider  apixaban (ELIQUIS) 5 MG TABS tablet Take 1 tablet (5 mg total) by mouth 2 (two) times daily. Resume on 11/2 morning 03/27/21   Patwardhan, Reynold Bowen, MD  Baclofen 5 MG TABS Take 5-10 mg by mouth daily as needed (Migraines). 08/09/19   [provider]  Blood Glucose Monitoring Suppl (ACCU-CHEK [place]ANO SMARTVIEW) W/DEVICE KIT 1 Device by Does not apply route 4 (four) times daily - after meals and at bedtime. 11/29/14   Frazier Richards, MD  carvedilol (COREG) 25 MG tablet Take 2 tablets (50 mg total) by mouth 2 (two) times daily with a meal. 03/05/21   Cantwell, Celeste C, PA-C  Continuous Blood Gluc Sensor (DEXCOM G6 SENSOR) MISC SMARTSIG:1 Topical Every 10 Days 10/18/20   [provider]  Continuous Blood Gluc Transmit (DEXCOM G6 TRANSMITTER) MISC See admin instructions. 08/16/20   [provider]  Dulaglutide 1.5 MG/0.5ML SOPN Inject 1.5 mg into the skin once a week. 06/05/20   [provider]  escitalopram (LEXAPRO) 10 MG tablet Take 10 mg by mouth daily. 03/01/20   [provider]  fexofenadine (ALLEGRA) 180 MG tablet Take 180 mg by mouth daily.    [provider]  FLOVENT HFA 110 MCG/ACT inhaler Inhale 2 puffs into the lungs 2 (two) times daily. 04/26/21   [provider]  fluticasone (FLONASE) 50 MCG/ACT nasal spray Place 2 sprays into both nostrils daily as needed for allergies.  09/22/14   [provider]  furosemide (LASIX) 40 MG tablet Take 1 tablet (40 mg total) by mouth daily as needed for fluid or edema. Patient taking differently: Take 40 mg by mouth daily. 03/05/21 03/05/22  Cantwell, Celeste C, PA-C  glucose blood (  ACCU-CHEK SMARTVIEW) test strip Check sugar 6 x daily Patient taking differently: Check sugar 6 x daily. Uses true test instead 11/29/14   Frazier Richards, MD  HUMALOG KWIKPEN 200 UNIT/ML KwikPen SMARTSIG:10-50 Unit(s) SUB-Q 09/06/21   [provider]  Insulin Disposable Pump (OMNIPOD DASH PODS, GEN 4,) MISC 200 Units every other day. Humalog 02/23/21   [provider]  Insulin Infusion Pump (DEXCOM G6 BASAL-IQ INSULIN PMP) DEVI by Does not apply route.    [provider]  Insulin Pen Needle (INSUPEN PEN NEEDLES) 32G X 4 MM MISC BD  Pen Needles- brand specific. Inject insulin via insulin pen daily 11/29/14   Frazier Richards, MD  ivabradine (CORLANOR) 5 MG TABS tablet Take 1 tablet (5 mg total) by mouth 2 (two) times daily with a meal. 08/14/21   Cantwell, Tonsina C, PA-C  Lancet Devices (ACCU-CHEK [place]) lancets Use as instructed for blood glucose checks four times daily, before meals and at bedtime 11/29/14   Frazier Richards, MD  medroxyPROGESTERone (DEPO-PROVERA) 150 MG/ML injection Inject 150 mg into the muscle every 3 (three) months.    [provider]  montelukast (SINGULAIR) 10 MG tablet Take 10 mg by mouth at bedtime.    [provider]  Multiple Vitamin (MULTI-VITAMIN) tablet Take 1 tablet by mouth daily.    [provider]  nortriptyline (PAMELOR) 10 MG capsule Take 20 mg by mouth at bedtime.    [provider]  NURTEC 75 MG TBDP Take 1 tablet by mouth daily. 04/02/21   [provider]  ondansetron (ZOFRAN-ODT) 4 MG disintegrating tablet Take 1 tablet (4 mg total) by mouth every 8 (eight) hours as needed for nausea or vomiting. 09/09/21   Deatra Canter, Amjad, PA-C  pantoprazole (PROTONIX) 40 MG tablet Take 40 mg by mouth daily.    [provider]  potassium chloride (KLOR-CON M) 10 MEQ tablet Take 1 tablet by mouth as needed. 01/16/21   [provider]  rosuvastatin (CRESTOR) 20 MG tablet Take 1 tablet (20 mg total) by mouth daily at 6 PM. 03/30/20   Elgergawy, Silver Huguenin, MD  sacubitril-valsartan (ENTRESTO) 97-103 MG Take 0.5 tablets by mouth 2 (two) times daily. 09/20/21   Cantwell, Celeste C, PA-C  spironolactone (ALDACTONE) 25 MG tablet Take 0.5 tablets (12.5 mg total) by mouth daily. 04/27/21 04/22/22  Cantwell, Celeste C, PA-C  topiramate (TOPAMAX) 100 MG tablet Take 100 mg by mouth 2 (two) times daily.    [provider]  TRUEPLUS LANCETS 33G MISC USE TO CHECK BLOOD GLUCOSE QID - AFTER MEALS AND AT BEDTIME 12/22/14   [provider]  VENTOLIN HFA 108 (90  BASE) MCG/ACT inhaler Use as directed 2 puffs in the mouth or throat 4 (four) times daily as needed for shortness of breath.  12/21/14   [provider]      Allergies    Lisinopril, Metformin, Farxiga [dapagliflozin], Ibuprofen, and Ketorolac    Review of Systems   Review of Systems  Constitutional:  Negative for diaphoresis and fever.  Respiratory:  Positive for cough and shortness of breath.   Cardiovascular:  Positive for leg swelling. Negative for chest pain and palpitations.  Gastrointestinal:  Negative for nausea.  Neurological:  Positive for dizziness.  All other systems reviewed and are negative.   Physical Exam Updated Vital Signs BP 109/78 (BP Location: Right Arm)   Pulse 63   Temp 98.8 F (37.1 C) (Oral)   Resp 16   SpO2 100%  Physical  Exam Vitals and nursing note reviewed.  Constitutional:      General: She is not in acute distress.    Appearance: Normal appearance. She is obese. She is ill-appearing. She is not toxic-appearing or diaphoretic.  HENT:     Head: Normocephalic and atraumatic.  Eyes:     General: No scleral icterus.    Extraocular Movements: Extraocular movements intact.     Conjunctiva/sclera: Conjunctivae normal.     Pupils: Pupils are equal, round, and reactive to light.  Cardiovascular:     Rate and Rhythm: Normal rate and regular rhythm.     Pulses: Normal pulses.     Heart sounds: Heart sounds are distant.  Pulmonary:     Effort: Pulmonary effort is normal. Tachypnea present. No accessory muscle usage or respiratory distress.     Breath sounds: Examination of the right-lower field reveals rales. Examination of the left-lower field reveals rales. Decreased breath sounds and rales present.  Chest:     Chest wall: No tenderness.  Abdominal:     General: Bowel sounds are normal.     Palpations: Abdomen is soft.  Musculoskeletal:     Cervical back: Neck supple.     Right lower leg: No tenderness. 1+ Edema present.     Left lower  leg: No tenderness. 1+ Edema present.  Skin:    General: Skin is warm and dry.     Capillary Refill: Capillary refill takes less than 2 seconds.  Neurological:     General: No focal deficit present.     Mental Status: She is alert and oriented to person, place, and time. Mental status is at baseline.     Comments: Baseline expressive aphasia   Psychiatric:        Mood and Affect: Mood normal.        Behavior: Behavior normal.        Thought Content: Thought content normal.        Judgment: Judgment normal.     ED Results / Procedures / Treatments   Labs (all labs ordered are listed, but only abnormal results are displayed) Labs Reviewed  BASIC METABOLIC PANEL - Abnormal; Notable for the following components:      Result Value   CO2 21 (*)    Glucose, Bld 152 (*)    Creatinine, Ser 1.35 (*)    Calcium 8.8 (*)    GFR, Estimated 51 (*)    All other components within normal limits  BRAIN NATRIURETIC PEPTIDE - Abnormal; Notable for the following components:   B Natriuretic Peptide 132.0 (*)    All other components within normal limits  TROPONIN I (HIGH SENSITIVITY) - Abnormal; Notable for the following components:   Troponin I (High Sensitivity) 100 (*)    All other components within normal limits  TROPONIN I (HIGH SENSITIVITY) - Abnormal; Notable for the following components:   Troponin I (High Sensitivity) 88 (*)    All other components within normal limits  RESP PANEL BY RT-PCR (FLU A&B, COVID) ARPGX2  CBC  I-STAT BETA HCG BLOOD, ED ([facility], [facility], [facility] ONLY)   EKG EKG Interpretation  Date/Time:  Tuesday December 11 2021 16:52:03 EDT Ventricular Rate:  63 PR Interval:  166 QRS Duration: 140 QT Interval:  512 QTC Calculation: 523 R Axis:   153 Text Interpretation: Normal sinus rhythm Right axis deviation Non-specific intra-ventricular conduction block Abnormal ECG Confirmed by Godfrey Pick [phone]) on 12/11/2021 10:57:48 PM  Radiology CT Angio Chest PE W/Cm &/Or Wo Cm  Result Date:  12/11/2021 CLINICAL DATA:  Dizziness and intermittent shortness of breath for the past few days. History of pulmonary embolism. EXAM: CT ANGIOGRAPHY CHEST WITH CONTRAST TECHNIQUE: Multidetector CT imaging of the chest was performed using the standard protocol during bolus administration of intravenous contrast. Multiplanar CT image reconstructions and MIPs were obtained to evaluate the vascular anatomy. RADIATION DOSE REDUCTION: This exam was performed according to the departmental dose-optimization program which includes automated exposure control, adjustment of the mA and/or kV according to patient size and/or use of iterative reconstruction technique. CONTRAST:  48m OMNIPAQUE IOHEXOL 350 MG/ML SOLN COMPARISON:  Chest x-ray from same day. CT chest dated December 06, 2021. FINDINGS: Cardiovascular: Satisfactory opacification of the pulmonary arteries to the segmental level. No evidence of pulmonary embolism. Unchanged mild cardiomegaly. No pericardial effusion. No thoracic aortic aneurysm. Reflux of contrast into the IVC and hepatic veins. Mediastinum/Nodes: Anterior mediastinal soft tissue is not significantly changed. No enlarged mediastinal, hilar, or axillary lymph nodes. Thyroid gland, trachea, and esophagus demonstrate no significant findings. Lungs/Pleura: Previously seen scattered ground-glass opacities and interlobular septal thickening have resolved. Mild mosaic attenuation. Unchanged scarring in the lingula. No focal consolidation, pleural effusion, or pneumothorax. Upper Abdomen: No acute abnormality. Musculoskeletal: No chest wall abnormality. No acute or significant osseous findings. Review of the MIP images confirms the above findings. IMPRESSION: 1. No evidence of pulmonary embolism. 2. Previously seen mild pulmonary edema has resolved. 3. 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 Electronically Signed   By: WTitus DubinM.D.   On: 12/11/2021 20:47   DG Chest  2 View  Result Date: 12/11/2021 CLINICAL DATA:  Shortness of breath.  Dizziness. EXAM: CHEST - 2 VIEW COMPARISON:  Chest CT 12/06/2021, 5 days ago FINDINGS: Mild cardiomegaly. Mediastinal mass on chest CT is not well seen by radiograph. No focal airspace disease. No pneumothorax or pleural effusion. There is slight vascular congestion. No acute osseous abnormalities. IMPRESSION: Mild cardiomegaly and vascular congestion. Electronically Signed   By: MKeith RakeM.D.   On: 12/11/2021 18:26    Procedures Procedures   Medications Ordered in ED Medications  iohexol (OMNIPAQUE) 350 MG/ML injection 65 mL (65 mLs Intravenous Contrast Given 12/11/21 2034)  sodium chloride 0.9 % bolus 500 mL (0 mLs Intravenous Stopped 12/11/21 2226)    ED Course/ Medical Decision Making/ A&P                           Medical Decision Making Amount and/or Complexity of Data Reviewed Labs: ordered. Radiology: ordered.  Risk Prescription drug management.  This patient presents to the ED for concern of shortness of breath, this involves an extensive number of treatment options, and is a complaint that carries with it a high risk of complications and morbidity.  The differential diagnosis includes ACS, CHF, effusion or tamponade, PE, pneumothorax, asthma, COPD, pneumonia, sepsis, etc.  Co morbidities that complicate the patient evaluation Heart failure, CVA, hypertension, diabetes  Additional history obtained:  Additional history obtained from: Mother at bedside External records from outside source obtained and reviewed including: Most recent cardiology physician note  EKG: EKG: normal EKG, normal sinus rhythm, right axis deviation.   Cardiac Monitoring: The patient was maintained on a cardiac monitor.  I personally viewed and interpreted the cardiac monitored which showed an underlying rhythm of: Sinus rhythm  Lab Results: I personally ordered, reviewed, and interpreted labs. Pertinent results  include: CBC within normal limits BMP with mildly elevated  creatinine 1.35 BNP 132 Troponin 100, 88, delta -12 COVID and flu negative  Imaging Studies ordered:  I ordered imaging studies which included x-ray and CT.  I independently reviewed & interpreted imaging & am in agreement with radiology impression. Imaging shows: X-ray of the chest shows mild cardiomegaly and vascular congestion CTA PE study shows no PE and previously seen mild pulmonary edema has resolved  Medications  I ordered medication including 500 mL fluid bolus for possible dehydration Reevaluation of the patient after medication shows that patient stayed the same -I reviewed the patient's home medications and did not make adjustments. -I did not prescribe new home medications.  Tests Considered: No further testing at this time  Critical Interventions: N/A  Consultations: I requested consultation with the cardiologist, Dr. Terri Skains,  and discussed lab and imaging findings as well as pertinent plan - they recommend: orthostatic vitals after 512m fluid. I then relayed orthostatic vitals to him which were negative. He recommends no medication changes and will see her in clinic tomorrow morning for ongoing CHF symptoms. Feels she can be discharged rather than admission.   SDH None identified   ED Course:  41year old female who presents to the emergency department with shortness of breath.  She does have a significant history of CHF with a EF of 20 to 25% as well as hypertension and previous PE. Work-up started in triage which resulted in a troponin of 100 by the time she was roomed.  On my exam she is mildly tachypneic and complains of shortness of breath.  She is not tachycardic or hypoxic.  Lung sounds are distant.  Initial differential concerning for ACS versus PE versus CHF exacerbation given her troponin of 100.    While delta troponin was pending, I ordered BNP and CTA PE study. BNP resulted at 132, not  significantly elevated but she does not have previously elevated numbers. CTA PE study is negative for acute PE. EKG is without ischemia or infarction.  I do not think this is ACS at this time Chest x-ray without pneumothorax, pneumonia.  She has no history of COPD or asthma to contribute.  Presentation also not consistent with pericardial effusion or tamponade.  She was mildly dizzy upon standing.  We gave her a small 500 mL fluid bolus.  After this we obtained orthostatic vital signs which were negative.  I consulted with cardiology and spoke with Dr. TTerri Skains  He believes this may just be mild CHF.  He does not feel that she needs to be admitted at this time.  Given her significant heart failure history and low EF, he will see her tomorrow in clinic.  He does not recommend any changes to her medications at this time.  We did discuss whether she was overly diuresed given her elevated creatinine and working in a hot work environment.  Given that her orthostatics were negative he does not feel that she is overly diuresed and does not recommend changing medicines.  I have discussed these findings with the patient.  They verbalized understanding.  They understand to present to the clinic tomorrow to have ongoing management of her symptoms.  Although she is not completely asymptomatic she feels improved from presentation.  Overall she is well-appearing and not in any acute distress.  She is able to speak in complete sentences.  Not hypoxic or tachypneic on reevaluation.  Feel that she is safe for discharge with strict return precautions should her symptoms worsen.  She is agreeable to this plan.  After consideration of the diagnostic results and the patients response to treatment, I feel that the patent would benefit from discharge.  Considered admission for troponin elevation and possible ACS and CHF workup. However, after discussion with cardiology, and patient clinical improvement, will discharge with close  follow-up tomorrow. The patient has been appropriately medically screened and/or stabilized in the ED. I have low suspicion for any other emergent medical condition which would require further screening, evaluation or treatment in the ED or require inpatient management. The patient is overall well appearing and non-toxic in appearance. They are hemodynamically stable at time of discharge.   Final Clinical Impression(s) / ED Diagnoses Final diagnoses:  Shortness of breath    Rx / DC Orders ED Discharge Orders     None         Mickie Hillier, PA-C 12/11/21 2309    Godfrey Pick, MD 12/11/21 [phone]

## 2021-12-11 NOTE — ED Notes (Signed)
Pt ambulated to BR with cane, NAD noted, steady gait noted

## 2021-12-11 NOTE — ED Notes (Signed)
Pt arrived to treatment RM 28 from [facility] at this time, NAD noted

## 2021-12-11 NOTE — Discharge Instructions (Addendum)
You were seen in the emergency department today for shortness of breath and dizziness.  While you were here we did labs, EKG, chest x-ray and a CT to evaluate for pulmonary embolism.  He did not have a pulmonary embolism.  He also did not have pneumonia.  As we discussed, your troponin was mildly elevated but decreased on her second lab drawing.  We talked with the cardiologist Dr. Odis Hollingshead who would like to see you tomorrow morning in clinic. You may also be seen by Elvin So, PA-C that works with Dr. Odis Hollingshead. Please call the office in the morning before you head to the clinic. Please return for any worsening shortness of breath or chest pain.

## 2021-12-11 NOTE — ED Notes (Signed)
Pt return from CT scanner, NAD noted 

## 2021-12-12 ENCOUNTER — Ambulatory Visit: Payer: 59 | Admitting: Student

## 2021-12-12 ENCOUNTER — Encounter: Payer: Self-pay | Admitting: Student

## 2021-12-12 VITALS — BP 92/67 | HR 77 | Temp 97.5°F | Resp 17 | Ht 61.0 in | Wt 241.4 lb

## 2021-12-12 DIAGNOSIS — I5022 Chronic systolic (congestive) heart failure: Secondary | ICD-10-CM

## 2021-12-12 DIAGNOSIS — I428 Other cardiomyopathies: Secondary | ICD-10-CM

## 2021-12-12 DIAGNOSIS — I10 Essential (primary) hypertension: Secondary | ICD-10-CM

## 2021-12-12 DIAGNOSIS — R0609 Other forms of dyspnea: Secondary | ICD-10-CM

## 2021-12-12 MED ORDER — FUROSEMIDE 40 MG PO TABS: 40.0000 mg | ORAL_TABLET | Freq: Every day | ORAL | 3 refills | Status: DC | PRN

## 2021-12-12 NOTE — Progress Notes (Signed)
Primary Physician/Referring:  [name], MD  Patient ID: Shelly Shields, female    DOB: 1981/04/24, 41 y.o.   MRN: [id number]  Chief Complaint  Patient presents with   Congestive Heart Failure   HPI:    [name]  is a 41 y.o. African-American female with hypertension, hyperlipidemia, uncontrolled diabetes mellitus, obesity and obstructive sleep apnea on CPAP and history of Covid pneumonia in January 2021 and also had pulmonary embolism during Covid infection.  She is also being evaluated for vasculitis. 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  Patient underwent nuclear stress test which revealed large medium intensity fixed perfusion defect, suggestive of ischemic etiology for cardiomyopathy.  She subsequently underwent cardiac catheterization which revealed no significant CAD, therefore myopathy is nonischemic.  Notably in the past patient has been unable tolerate [place] due to recurrent yeast infections.  Patient presents for urgent visit with concerns of dizziness and dyspnea.  She was evaluated in the emergency department yesterday for similar concerns at which time troponin was minimally elevated and downtrending, BNP also minimally elevated, patient was not orthostatic.  She was advised to continue present medications and follow-up closely with our office.  Patient presents for follow-up today.  She has not taken any of her medications this morning including heart failure medications of carvedilol, Lasix, Entresto, spironolactone.  Patient does note that her symptoms started after the air conditioning at her work place started malfunctioning.  She does report she continues to drink 40 to 60 ounces of water per day.  She has mild bilateral leg edema which remains stable.  Denies orthopnea, PND.  Weight remains stable compared to April 2023.  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 hauler    Diabetes mellitus without complication ([place])    Hypertension    Migraines    Obesity    Pulmonary embolism ([place]) 05/2019   with COVID   Sleep apnea    Past Surgical History:  Procedure Laterality Date   BILATERAL CARPAL TUNNEL RELEASE     LEFT HEART CATH AND CORONARY ANGIOGRAPHY N/A 03/27/2021   Procedure: LEFT HEART CATH AND CORONARY ANGIOGRAPHY;  Surgeon: Nigel Mormon, MD;  Location: [place] [facility];  Service: Cardiovascular;  Laterality: N/A;   TEE WITHOUT CARDIOVERSION N/A 03/28/2020   Procedure: TRANSESOPHAGEAL ECHOCARDIOGRAM (TEE);  Surgeon: Adrian Prows, MD;  Location: [facility];  Service: Cardiovascular;  Laterality: N/A;   Family History  Problem Relation Age of Onset   Hypertension Mother    Thyroid disease Mother    Hypertension Father    Stroke Father 25   Diabetes Father    Sleep apnea Father    Aneurysm Paternal Aunt 68   CVA Paternal Uncle 33   Heart failure Paternal Grandfather     Social History   Tobacco Use   Smoking status: Never   Smokeless tobacco: Never  Substance Use Topics   Alcohol use: Yes    Alcohol/week: 0.0 standard drinks of alcohol    Comment: occasional    Marital Status: Single   ROS  Review of Systems  Constitutional: Negative for malaise/fatigue and weight gain.  Cardiovascular:  Positive for dyspnea on exertion and leg swelling (ankles, stable). Negative for chest pain, claudication, near-syncope, orthopnea, palpitations, paroxysmal nocturnal dyspnea and syncope.  Respiratory:  Negative for shortness of breath.   Hematologic/Lymphatic: Does not bruise/bleed easily.  Gastrointestinal:  Negative for melena.  Neurological:  Positive for dizziness (intermittent).    Objective  Blood pressure 92/67, pulse 77, temperature (!) 97.5 F (36.4 C), temperature source Temporal, resp. rate 17, height $RemoveBe'5\' 1"'phxQumUyT$  (1.549 m), weight 241 lb 6.4 oz (109.5 kg), SpO2 98 %.     12/12/2021   10:49 AM  12/11/2021   11:00 PM 12/11/2021   10:30 PM  Vitals with BMI  Height $Remov'5\' 1"'iIomPn$     Weight 241 lbs 6 oz    BMI 62.03    Systolic 92 559 741  Diastolic 67 72 71  Pulse 77 66 65      Physical Exam Vitals reviewed.  Constitutional:      Appearance: She is obese.  Cardiovascular:     Rate and Rhythm: Normal rate and regular rhythm.     Pulses: Intact distal pulses.     Heart sounds: S1 normal and S2 normal. No murmur heard.    No gallop.     Comments: No JVD.  Pulmonary:     Effort: Pulmonary effort is normal. No respiratory distress.     Breath sounds: No wheezing, rhonchi or rales.  Musculoskeletal:     Right lower leg: Edema (minimal, ankle) present.     Left lower leg: Edema (minimal, ankle) present.  Neurological:     Mental Status: She is alert.     Cranial Nerves: No cranial nerve deficit.     Gait: Gait (Essentially normal) normal.     Laboratory examination:   Recent Labs    09/09/21 1347 09/20/21 0959 12/11/21 1712  NA 139 140 138  K 3.6 4.0 3.9  CL 108 107* 109  CO2 24 19* 21*  GLUCOSE 244* 149* 152*  BUN $Re'9 14 13  'RDW$ CREATININE 0.92 0.98 1.35*  CALCIUM 8.8* 9.2 8.8*  GFRNONAA >60  --  51*   estimated creatinine clearance is 62.8 mL/min (A) (by C-G formula based on SCr of 1.35 mg/dL (H)).     Latest Ref Rng & Units 12/11/2021    5:12 PM 09/20/2021    9:59 AM 09/09/2021    1:47 PM  CMP  Glucose 70 - 99 mg/dL 152  149  244   BUN 6 - 20 mg/dL $Remove'13  14  9   'LLvWqxc$ Creatinine 0.44 - 1.00 mg/dL 1.35  0.98  0.92   Sodium 135 - 145 mmol/L 138  140  139   Potassium 3.5 - 5.1 mmol/L 3.9  4.0  3.6   Chloride 98 - 111 mmol/L 109  107  108   CO2 22 - 32 mmol/L $RemoveB'21  19  24   'yStKDCNS$ Calcium 8.9 - 10.3 mg/dL 8.8  9.2  8.8   Total Protein 6.5 - 8.1 g/dL   6.5   Total Bilirubin 0.3 - 1.2 mg/dL   0.6   Alkaline Phos 38 - 126 U/L   65   AST 15 - 41 U/L   18   ALT 0 - 44 U/L   26       Latest Ref Rng & Units 12/11/2021    5:12 PM 09/09/2021    1:47 PM 03/19/2021    2:05 PM  CBC  WBC  4.0 - 10.5 K/uL 8.6  9.5  8.3   Hemoglobin 12.0 - 15.0 g/dL 13.2  13.2  13.3   Hematocrit 36.0 - 46.0 % 41.2  40.3  41.1   Platelets 150 - 400 K/uL 295  264  276     Lipid Panel No  results for input(s): "CHOL", "TRIG", "[place]", "VLDL", "HDL", "CHOLHDL", "LDLDIRECT" in the last 8760 hours.   HEMOGLOBIN A1C Lab Results  Component Value Date   HGBA1C 11.2 (H) 03/27/2020   MPG 274.74 03/27/2020   TSH No results for input(s): "TSH" in the last 8760 hours.  BNP    Component Value Date/Time   BNP 132.0 (H) 12/11/2021 1730    ProBNP    Component Value Date/Time   PROBNP 114 09/20/2021 0959   External labs:  None   Allergies   Allergies  Allergen Reactions   Lisinopril Cough   Metformin Diarrhea   Farxiga [Dapagliflozin]     Recurrent yeast infections   Ibuprofen Hives   Ketorolac Hives    Medications Prior to Visit:   Outpatient Medications Prior to Visit  Medication Sig Dispense Refill   apixaban (ELIQUIS) 5 MG TABS tablet Take 1 tablet (5 mg total) by mouth 2 (two) times daily. Resume on 11/2 morning 60 tablet 0   Baclofen 5 MG TABS Take 5-10 mg by mouth daily as needed (Migraines).     Blood Glucose Monitoring Suppl (ACCU-CHEK NANO SMARTVIEW) W/DEVICE KIT 1 Device by Does not apply route 4 (four) times daily - after meals and at bedtime. 1 kit 0   butalbital-acetaminophen-caffeine (FIORICET) 50-325-40 MG tablet Take 1 tablet by mouth as needed.     carvedilol (COREG) 25 MG tablet Take 2 tablets (50 mg total) by mouth 2 (two) times daily with a meal. 180 tablet 3   Continuous Blood Gluc Sensor (DEXCOM G6 SENSOR) MISC SMARTSIG:1 Topical Every 10 Days     Continuous Blood Gluc Transmit (DEXCOM G6 TRANSMITTER) MISC See admin instructions.     Dulaglutide 1.5 MG/0.5ML SOPN Inject 1.5 mg into the skin once a week.     escitalopram (LEXAPRO) 10 MG tablet Take 10 mg by mouth daily.     fexofenadine (ALLEGRA) 180 MG tablet Take 180 mg by mouth daily.     FLOVENT HFA 110  MCG/ACT inhaler Inhale 2 puffs into the lungs 2 (two) times daily.     fluticasone (FLONASE) 50 MCG/ACT nasal spray Place 2 sprays into both nostrils daily as needed for allergies.   3   glucose blood (ACCU-CHEK SMARTVIEW) test strip Check sugar 6 x daily (Patient taking differently: Check sugar 6 x daily. Uses true test instead) 200 each 3   HUMALOG KWIKPEN 200 UNIT/ML KwikPen SMARTSIG:10-50 Unit(s) SUB-Q     Insulin Disposable Pump (OMNIPOD DASH PODS, GEN 4,) MISC 200 Units every other day. Humalog     Insulin Infusion Pump (DEXCOM G6 BASAL-IQ INSULIN PMP) DEVI by Does not apply route.     Insulin Pen Needle (INSUPEN PEN NEEDLES) 32G X 4 MM MISC BD Pen Needles- brand specific. Inject insulin via insulin pen daily 200 each 3   ivabradine (CORLANOR) 5 MG TABS tablet Take 1 tablet (5 mg total) by mouth 2 (two) times daily with a meal. 180 tablet 3   Lancet Devices (ACCU-CHEK SOFTCLIX) lancets Use as instructed for blood glucose checks four times daily, before meals and at bedtime 100 each 5   medroxyPROGESTERone (DEPO-PROVERA) 150 MG/ML injection Inject 150 mg into the muscle every 3 (three) months.     montelukast (SINGULAIR) 10 MG tablet Take 10 mg by mouth at bedtime.     Multiple Vitamin (MULTI-VITAMIN) tablet Take 1 tablet by mouth daily.     nortriptyline (PAMELOR) 10 MG capsule Take 20 mg by mouth at bedtime.  NURTEC 75 MG TBDP Take 1 tablet by mouth daily.     ondansetron (ZOFRAN-ODT) 4 MG disintegrating tablet Take 1 tablet (4 mg total) by mouth every 8 (eight) hours as needed for nausea or vomiting. 20 tablet 0   pantoprazole (PROTONIX) 40 MG tablet Take 40 mg by mouth daily.     potassium chloride (KLOR-CON M) 10 MEQ tablet Take 1 tablet by mouth as needed.     rosuvastatin (CRESTOR) 20 MG tablet Take 1 tablet (20 mg total) by mouth daily at 6 PM. 30 tablet 0   sacubitril-valsartan (ENTRESTO) 97-103 MG Take 0.5 tablets by mouth 2 (two) times daily. 60 tablet 3   topiramate (TOPAMAX)  100 MG tablet Take 100 mg by mouth 2 (two) times daily.     TRUEPLUS LANCETS 33G MISC USE TO CHECK BLOOD GLUCOSE QID - AFTER MEALS AND AT BEDTIME  3   VENTOLIN HFA 108 (90 BASE) MCG/ACT inhaler Use as directed 2 puffs in the mouth or throat 4 (four) times daily as needed for shortness of breath.   3   furosemide (LASIX) 40 MG tablet Take 1 tablet (40 mg total) by mouth daily as needed for fluid or edema. (Patient taking differently: Take 40 mg by mouth daily.) 90 tablet 3   spironolactone (ALDACTONE) 25 MG tablet Take 0.5 tablets (12.5 mg total) by mouth daily. 45 tablet 3   No facility-administered medications prior to visit.   Final Medications at End of Visit    Current Meds  Medication Sig   apixaban (ELIQUIS) 5 MG TABS tablet Take 1 tablet (5 mg total) by mouth 2 (two) times daily. Resume on 11/2 morning   Baclofen 5 MG TABS Take 5-10 mg by mouth daily as needed (Migraines).   Blood Glucose Monitoring Suppl (ACCU-CHEK NANO SMARTVIEW) W/DEVICE KIT 1 Device by Does not apply route 4 (four) times daily - after meals and at bedtime.   butalbital-acetaminophen-caffeine (FIORICET) 50-325-40 MG tablet Take 1 tablet by mouth as needed.   carvedilol (COREG) 25 MG tablet Take 2 tablets (50 mg total) by mouth 2 (two) times daily with a meal.   Continuous Blood Gluc Sensor (DEXCOM G6 SENSOR) MISC SMARTSIG:1 Topical Every 10 Days   Continuous Blood Gluc Transmit (DEXCOM G6 TRANSMITTER) MISC See admin instructions.   Dulaglutide 1.5 MG/0.5ML SOPN Inject 1.5 mg into the skin once a week.   escitalopram (LEXAPRO) 10 MG tablet Take 10 mg by mouth daily.   fexofenadine (ALLEGRA) 180 MG tablet Take 180 mg by mouth daily.   FLOVENT HFA 110 MCG/ACT inhaler Inhale 2 puffs into the lungs 2 (two) times daily.   fluticasone (FLONASE) 50 MCG/ACT nasal spray Place 2 sprays into both nostrils daily as needed for allergies.    glucose blood (ACCU-CHEK SMARTVIEW) test strip Check sugar 6 x daily (Patient taking  differently: Check sugar 6 x daily. Uses true test instead)   HUMALOG KWIKPEN 200 UNIT/ML KwikPen SMARTSIG:10-50 Unit(s) SUB-Q   Insulin Disposable Pump (OMNIPOD DASH PODS, GEN 4,) MISC 200 Units every other day. Humalog   Insulin Infusion Pump (DEXCOM G6 BASAL-IQ INSULIN PMP) DEVI by Does not apply route.   Insulin Pen Needle (INSUPEN PEN NEEDLES) 32G X 4 MM MISC BD Pen Needles- brand specific. Inject insulin via insulin pen daily   ivabradine (CORLANOR) 5 MG TABS tablet Take 1 tablet (5 mg total) by mouth 2 (two) times daily with a meal.   Lancet Devices (ACCU-CHEK SOFTCLIX) lancets Use as instructed for blood glucose  checks four times daily, before meals and at bedtime   medroxyPROGESTERone (DEPO-PROVERA) 150 MG/ML injection Inject 150 mg into the muscle every 3 (three) months.   montelukast (SINGULAIR) 10 MG tablet Take 10 mg by mouth at bedtime.   Multiple Vitamin (MULTI-VITAMIN) tablet Take 1 tablet by mouth daily.   nortriptyline (PAMELOR) 10 MG capsule Take 20 mg by mouth at bedtime.   NURTEC 75 MG TBDP Take 1 tablet by mouth daily.   ondansetron (ZOFRAN-ODT) 4 MG disintegrating tablet Take 1 tablet (4 mg total) by mouth every 8 (eight) hours as needed for nausea or vomiting.   pantoprazole (PROTONIX) 40 MG tablet Take 40 mg by mouth daily.   potassium chloride (KLOR-CON M) 10 MEQ tablet Take 1 tablet by mouth as needed.   rosuvastatin (CRESTOR) 20 MG tablet Take 1 tablet (20 mg total) by mouth daily at 6 PM.   sacubitril-valsartan (ENTRESTO) 97-103 MG Take 0.5 tablets by mouth 2 (two) times daily.   topiramate (TOPAMAX) 100 MG tablet Take 100 mg by mouth 2 (two) times daily.   TRUEPLUS LANCETS 33G MISC USE TO CHECK BLOOD GLUCOSE QID - AFTER MEALS AND AT BEDTIME   VENTOLIN HFA 108 (90 BASE) MCG/ACT inhaler Use as directed 2 puffs in the mouth or throat 4 (four) times daily as needed for shortness of breath.    [DISCONTINUED] furosemide (LASIX) 40 MG tablet Take 1 tablet (40 mg total) by  mouth daily as needed for fluid or edema. (Patient taking differently: Take 40 mg by mouth daily.)   [DISCONTINUED] spironolactone (ALDACTONE) 25 MG tablet Take 0.5 tablets (12.5 mg total) by mouth daily.   Radiology:   CT Angio Chest PE W/Cm &/Or Wo Cm  Result Date: 12/11/2021 CLINICAL DATA:  Dizziness and intermittent shortness of breath for the past few days. History of pulmonary embolism. EXAM: CT ANGIOGRAPHY CHEST WITH CONTRAST TECHNIQUE: Multidetector CT imaging of the chest was performed using the standard protocol during bolus administration of intravenous contrast. Multiplanar CT image reconstructions and MIPs were obtained to evaluate the vascular anatomy. RADIATION DOSE REDUCTION: This exam was performed according to the departmental dose-optimization program which includes automated exposure control, adjustment of the mA and/or kV according to patient size and/or use of iterative reconstruction technique. CONTRAST:  92mL OMNIPAQUE IOHEXOL 350 MG/ML SOLN COMPARISON:  Chest x-ray from same day. CT chest dated December 06, 2021. FINDINGS: Cardiovascular: Satisfactory opacification of the pulmonary arteries to the segmental level. No evidence of pulmonary embolism. Unchanged mild cardiomegaly. No pericardial effusion. No thoracic aortic aneurysm. Reflux of contrast into the IVC and hepatic veins. Mediastinum/Nodes: Anterior mediastinal soft tissue is not significantly changed. No enlarged mediastinal, hilar, or axillary lymph nodes. Thyroid gland, trachea, and esophagus demonstrate no significant findings. Lungs/Pleura: Previously seen scattered ground-glass opacities and interlobular septal thickening have resolved. Mild mosaic attenuation. Unchanged scarring in the lingula. No focal consolidation, pleural effusion, or pneumothorax. Upper Abdomen: No acute abnormality. Musculoskeletal: No chest wall abnormality. No acute or significant osseous findings. Review of the MIP images confirms the above  findings. IMPRESSION: 1. No evidence of pulmonary embolism. 2. Previously seen mild pulmonary edema has resolved. 3. 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 Electronically Signed   By: Titus Dubin M.D.   On: 12/11/2021 20:47   DG Chest 2 View  Result Date: 12/11/2021 CLINICAL DATA:  Shortness of breath.  Dizziness. EXAM: CHEST - 2 VIEW COMPARISON:  Chest CT 12/06/2021, 5 days ago FINDINGS: Mild cardiomegaly. Mediastinal mass on  chest CT is not well seen by radiograph. No focal airspace disease. No pneumothorax or pleural effusion. There is slight vascular congestion. No acute osseous abnormalities. IMPRESSION: Mild cardiomegaly and vascular congestion. Electronically Signed   By: Keith Rake M.D.   On: 12/11/2021 18:26   CT Code Stroke CTA Head W/WO contrast 03/27/2020 1. Negative for large vessel occlusion but positive for Right MCA M3 occlusion. CT Perfusion detects core infarct with minimally larger penumbra at the right insula/operculum corresponding to the plain CT finding.  2. Additionally, mild vessel irregularity is noted in multiple other circle-of-Willis branches, including the left A1, right P2, left M2. Although nonspecific this constellation of clinical and imaging findings might indicate accelerated branch vessel atherosclerosis. Although other large vessels appear normal, with no atherosclerosis in the neck or at the aortic arch.   MR ANGIO HEAD WO CONTRAST  03/27/2020  1. Moderate-sized acute right MCA infarct. 2. Small subacute left parieto-occipital infarcts. 3. Right M3 branch occlusion, otherwise negative head [id number]   CT ABDOMEN PELVIS W CONTRAST  03/27/2020  1. New 18 mm hypoattenuating lesion upper pole right kidney with areas of subcapsular multifocal decreased enhancement in the lower pole right kidney. Imaging features are nonspecific and could be related to pyelonephritis and phlegmon/evolving abscess in the upper pole right  kidney cannot be excluded. Alternatively, multifocal right renal infarct could have this appearance. Correlation with urinalysis may prove helpful.  2. Relatively well-defined 2.2 cm lesion in the inferior lingula measures water density. This may be loculated pleural fluid or intraparenchymal fluid collection in this patient with a history of pulmonary embolus and bilateral ground-glass airspace disease on the previous study suspicious for multifocal atypical/viral pneumonia. Consider follow-up to ensure resolution.  Cardiac Studies:  Left heart catheterization 03/27/2021:  LM: Normal LAD: Normal         Prox diag 1 30% disease Lcx: Normal RCA: Normal   Mildly decompensated nonischemic cardiomyopathy Continue GDMT for HFrEF  PCV ECHOCARDIOGRAM COMPLETE 04/12/2021 Left ventricle cavity is normal in size. Normal left ventricular wall thickness. Severe global hypokinesis. LVEF 25-30%. Indeterminate diastolic filling pattern. No significant valvular abnormality. Normal right atrial pressure. No significant change compared to previous study on 07/25/2020.  PCV MYOCARDIAL PERFUSION WITH LEXISCAN 08/14/2020 Lexiscan nuclear stress test performed using 1-day protocol. Left ventricle is dilated. Severe decrease in global myocardial thickening and wall motion. Stress LVEF <20%. Large sized, medium intensity, fixed perfusion defect in anteroapical, apical, inferior, inferoseptal myocardium. High risk study   LONG TERM MONITOR (8-14 DAYS) 06/22/20-07/06/20:  Predominant underlying rhythm was sinus with minimum heart rate of 71 bpm, maximum heart rate of 193 bpm average heart rate of 91 bpm.  There were no patient triggered events.  PACs and PVCs were rare.  Patient with intermittent right bundle branch block.  2 episodes of supraventricular tachycardia with the longest lasting 12 seconds and a maximum heart rate of 93 bpm.  No evidence of atrial fibrillation.  No ventricular tachycardia, high degree AV  block, pauses >3 seconds.  EKG:  09/20/2021: Sinus rhythm at a rate of 76 bpm.  Right atrial enlargement.  IVCD.  Poor R wave progression, cannot exclude anteroseptal infarct old.  03/05/2021: Sinus rhythm at a rate of 62 bpm, new left bundle branch block, no further analysis.  EKG 04/18/2020: Sinus rhythm at a rate of 90 bpm. Left axis deviation. PRWP, cannot exclude anteroseptal infarct old. Non-specific T wave abnormality.   EKG 03/27/2020: Sinus tachycardia at rate of 102 bpm, normal axis,  borderline criteria for LVH.  Borderline QT elongation.  Compared to 06/30/2019, sinus tachycardia new.  No change in QT elongation.  Assessment     ICD-10-CM   1. Nonischemic cardiomyopathy (HCC)  Q59.5 Basic metabolic panel    Pro b natriuretic peptide (BNP)[facility] [facility])    2. Chronic systolic heart failure (HCC)  G38.75 Basic metabolic panel    Pro b natriuretic peptide (BNP)9[facility] [facility])    3. DOE (dyspnea on exertion)  R06.09     4. Essential hypertension  I10       Medications Discontinued During This Encounter  Medication Reason   spironolactone (ALDACTONE) 25 MG tablet Side effect (s)   furosemide (LASIX) 40 MG tablet Reorder     Meds ordered this encounter  Medications   furosemide (LASIX) 40 MG tablet    Sig: Take 1 tablet (40 mg total) by mouth daily as needed for fluid or edema.    Dispense:  90 tablet    Refill:  3    Recommendations:   RAYLIN DIGUGLIELMO is a 41 y.o. African-American female with hypertension, hyperlipidemia, uncontrolled diabetes mellitus, obesity and obstructive sleep apnea and history of Covid pneumonia in January 2021 and also had pulmonary embolism during Covid infection.  She is also being evaluated for vasculitis. 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 Patient underwent nuclear stress test which revealed large  medium intensity fixed perfusion defect, suggestive of ischemic etiology for cardiomyopathy.  She subsequently underwent cardiac catheterization which revealed no significant CAD, therefore myopathy is nonischemic.  Notably in the past patient has been unable tolerate [place] due to recurrent yeast infections.  Patient is fairly euvolemic on exam today.  Her blood pressure remains quite soft although she has not taken morning medications including carvedilol, spironolactone, or Entresto.  Suspect patient's low blood pressure is underlying etiology of recent dyspnea and dizziness.  Patient's creatinine likely elevated due to hypovolemia as well.  Advised patient to discontinue spironolactone and to stop taking Lasix daily but rather as needed.  Discussed as needed dosing, she verbalized understanding agreement with Lasix.  We will obtain repeat proBNP as well as BMP in 3 days.  We will plan to follow closely with follow-up in 6 days.  Counseled patient to notify our office immediately if worsening symptoms.  Also recommend referral to electrophysiology consideration of BiV ICD given LVEF persistently <30% due to underlying nonischemic cardiomyopathy.   Alethia Berthold, PA-C 12/12/2021, 1:27 PM Office: [phone]

## 2021-12-15 LAB — BASIC METABOLIC PANEL
BUN/Creatinine Ratio: 13 (ref 9–23)
BUN: 14 mg/dL (ref 6–24)
CO2: 19 mmol/L — ABNORMAL LOW (ref 20–29)
Calcium: 9.2 mg/dL (ref 8.7–10.2)
Chloride: 107 mmol/L — ABNORMAL HIGH (ref 96–106)
Creatinine, Ser: 1.04 mg/dL — ABNORMAL HIGH (ref 0.57–1.00)
Glucose: 199 mg/dL — ABNORMAL HIGH (ref 70–99)
Potassium: 4.2 mmol/L (ref 3.5–5.2)
Sodium: 139 mmol/L (ref 134–144)
eGFR: 69 mL/min/{1.73_m2} (ref >59)

## 2021-12-15 LAB — PRO B NATRIURETIC PEPTIDE: NT-Pro BNP: 543 pg/mL — ABNORMAL HIGH (ref 0–130)

## 2021-12-18 ENCOUNTER — Ambulatory Visit: Payer: 59 | Admitting: Student

## 2021-12-18 ENCOUNTER — Encounter: Payer: Self-pay | Admitting: Student

## 2021-12-18 VITALS — BP 136/80 | HR 83 | Temp 97.4°F | Resp 16 | Ht 61.0 in | Wt 246.0 lb

## 2021-12-18 DIAGNOSIS — I428 Other cardiomyopathies: Secondary | ICD-10-CM

## 2021-12-18 DIAGNOSIS — I5022 Chronic systolic (congestive) heart failure: Secondary | ICD-10-CM

## 2021-12-18 MED ORDER — FUROSEMIDE 40 MG PO TABS: 40.0000 mg | ORAL_TABLET | Freq: Every day | ORAL | 3 refills | Status: DC

## 2021-12-18 NOTE — Progress Notes (Signed)
Primary Physician/Referring:  [name], MD  Patient ID: Shelly Shields, female    DOB: 12-15-1980, 41 y.o.   MRN: [id number]  Chief Complaint  Patient presents with   Congestive Heart Failure   Follow-up    6 days   HPI:    JAIDYNN BALSTER  is a 41 y.o. African-American female with hypertension, hyperlipidemia, uncontrolled diabetes mellitus, obesity and obstructive sleep apnea on CPAP and history of Covid pneumonia in January 2021 and also had pulmonary embolism during Covid infection.  She is also being evaluated for vasculitis. 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  Patient underwent nuclear stress test which revealed large medium intensity fixed perfusion defect, suggestive of ischemic etiology for cardiomyopathy.  She subsequently underwent cardiac catheterization which revealed no significant CAD, therefore myopathy is nonischemic.  Notably in the past patient has been unable tolerate [place] due to recurrent yeast infections.  Patient was last seen in the office 12/12/2021 with complaints of dizziness and dyspnea.  At that time patient's blood pressure was low and suspected symptoms as well as elevated creatinine to be related to hypovolemia.  At last visit stopped both spironolactone and daily Lasix, advised patient to take Lasix as needed.  He now presents for 1 week follow-up.  BNP is elevated however creatinine has returned to essentially baseline.  Patient reports dizziness has essentially resolved, however she does continue to have dyspnea on exertion. Blood pressure has improved.   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 hauler    Diabetes mellitus without complication ([place])    Hypertension    Migraines    Obesity    Pulmonary embolism ([place]) 05/2019   with COVID   Sleep apnea    Past Surgical History:  Procedure Laterality Date    BILATERAL CARPAL TUNNEL RELEASE     LEFT HEART CATH AND CORONARY ANGIOGRAPHY N/A 03/27/2021   Procedure: LEFT HEART CATH AND CORONARY ANGIOGRAPHY;  Surgeon: Nigel Mormon, MD;  Location: [place] [facility];  Service: Cardiovascular;  Laterality: N/A;   TEE WITHOUT CARDIOVERSION N/A 03/28/2020   Procedure: TRANSESOPHAGEAL ECHOCARDIOGRAM (TEE);  Surgeon: Adrian Prows, MD;  Location: [facility] END[facility];  Service: Cardiovascular;  Laterality: N/A;   Family History  Problem Relation Age of Onset   Hypertension Mother    Thyroid disease Mother    Hypertension Father    Stroke Father 27   Diabetes Father    Sleep apnea Father    Aneurysm Paternal Aunt 68   CVA Paternal Uncle 31   Heart failure Paternal Grandfather     Social History   Tobacco Use   Smoking status: Never   Smokeless tobacco: Never  Substance Use Topics   Alcohol use: Yes    Alcohol/week: 0.0 standard drinks of alcohol    Comment: occasional    Marital Status: Single   ROS  Review of Systems  Constitutional: Negative for malaise/fatigue and weight gain.  Cardiovascular:  Positive for dyspnea on exertion and leg swelling (minimal). Negative for chest pain, claudication, near-syncope, orthopnea, palpitations, paroxysmal nocturnal dyspnea and syncope.  Respiratory:  Negative for shortness of breath.   Hematologic/Lymphatic: Does not bruise/bleed easily.  Gastrointestinal:  Negative for melena.  Neurological:  Negative for dizziness.    Objective  Blood pressure 136/80, pulse 83, temperature (!) 97.4 F (36.3 C), resp. rate 16, height _0  (1.549 m), weight 246  lb (111.6 kg), SpO2 98 %.     12/18/2021    8:49 AM 12/12/2021   10:49 AM 12/11/2021   11:00 PM  Vitals with BMI  Height _0  _1    Weight 246 lbs 241 lbs 6 oz   BMI 76.72 09.47   Systolic 096 92 283  Diastolic 80 67 72  Pulse 83 77 66      Physical Exam Vitals reviewed.  Constitutional:      Appearance: She is obese.  Cardiovascular:     Rate  and Rhythm: Normal rate and regular rhythm.     Pulses: Intact distal pulses.     Heart sounds: S1 normal and S2 normal. No murmur heard.    No gallop.     Comments: No JVD.  Pulmonary:     Effort: Pulmonary effort is normal. No respiratory distress.     Breath sounds: No wheezing, rhonchi or rales.  Musculoskeletal:     Right lower leg: Edema (minimal, ankle) present.     Left lower leg: Edema (minimal, ankle) present.  Neurological:     Mental Status: She is alert.     Cranial Nerves: No cranial nerve deficit.     Gait: Gait (Essentially normal) normal.   Physical exam unchanged compared to previous office visit.  Laboratory examination:   Recent Labs    09/09/21 1347 09/20/21 0959 12/11/21 1712 12/14/21 0942  NA 139 140 138 139  K 3.6 4.0 3.9 4.2  CL 108 107* 109 107*  CO2 24 19* 21* 19*  GLUCOSE 244* 149* 152* 199*  BUN _2 CREATININE 0.92 0.98 1.35* 1.04*  CALCIUM 8.8* 9.2 8.8* 9.2  GFRNONAA >60  --  51*  --    estimated creatinine clearance is 82.4 mL/min (A) (by C-G formula based on SCr of 1.04 mg/dL (H)).     Latest Ref Rng & Units 12/14/2021    9:42 AM 12/11/2021    5:12 PM 09/20/2021    9:59 AM  CMP  Glucose 70 - 99 mg/dL 199  152  149   BUN 6 - 24 mg/dL _3 Creatinine 0.57 - 1.00 mg/dL 1.04  1.35  0.98   Sodium 134 - 144 mmol/L 139  138  140   Potassium 3.5 - 5.2 mmol/L 4.2  3.9  4.0   Chloride 96 - 106 mmol/L 107  109  107   CO2 20 - 29 mmol/L _4 Calcium 8.7 - 10.2 mg/dL 9.2  8.8  9.2       Latest Ref Rng & Units 12/11/2021    5:12 PM 09/09/2021    1:47 PM 03/19/2021    2:05 PM  CBC  WBC 4.0 - 10.5 K/uL 8.6  9.5  8.3   Hemoglobin 12.0 - 15.0 g/dL 13.2  13.2  13.3   Hematocrit 36.0 - 46.0 % 41.2  40.3  41.1   Platelets 150 - 400 K/uL 295  264  276     Lipid Panel No results for input(s): "CHOL", "TRIG", "[place]", "VLDL", "HDL", "CHOLHDL", "LDLDIRECT" in the last 8760 hours.   HEMOGLOBIN A1C Lab Results  Component  Value Date   HGBA1C 11.2 (H) 03/27/2020   MPG 274.74 03/27/2020   TSH No results for input(s): "TSH" in the last 8760 hours.  BNP    Component Value Date/Time   BNP 132.0 (H) 12/11/2021 1730    ProBNP  Component Value Date/Time   PROBNP 543 (H) 12/14/2021 0942   External labs:  None   Allergies   Allergies  Allergen Reactions   Lisinopril Cough   Metformin Diarrhea   Farxiga [Dapagliflozin]     Recurrent yeast infections   Ibuprofen Hives   Ketorolac Hives    Medications Prior to Visit:   Outpatient Medications Prior to Visit  Medication Sig Dispense Refill   apixaban (ELIQUIS) 5 MG TABS tablet Take 1 tablet (5 mg total) by mouth 2 (two) times daily. Resume on 11/2 morning 60 tablet 0   Baclofen 5 MG TABS Take 5-10 mg by mouth daily as needed (Migraines).     Blood Glucose Monitoring Suppl (ACCU-CHEK NANO SMARTVIEW) W/DEVICE KIT 1 Device by Does not apply route 4 (four) times daily - after meals and at bedtime. 1 kit 0   butalbital-acetaminophen-caffeine (FIORICET) 50-325-40 MG tablet Take 1 tablet by mouth as needed.     carvedilol (COREG) 25 MG tablet Take 2 tablets (50 mg total) by mouth 2 (two) times daily with a meal. 180 tablet 3   Continuous Blood Gluc Sensor (DEXCOM G6 SENSOR) MISC SMARTSIG:1 Topical Every 10 Days     Continuous Blood Gluc Transmit (DEXCOM G6 TRANSMITTER) MISC See admin instructions.     Dulaglutide 1.5 MG/0.5ML SOPN Inject 1.5 mg into the skin once a week.     escitalopram (LEXAPRO) 10 MG tablet Take 10 mg by mouth daily.     fexofenadine (ALLEGRA) 180 MG tablet Take 180 mg by mouth daily.     FLOVENT HFA 110 MCG/ACT inhaler Inhale 2 puffs into the lungs 2 (two) times daily.     fluticasone (FLONASE) 50 MCG/ACT nasal spray Place 2 sprays into both nostrils daily as needed for allergies.   3   glucose blood (ACCU-CHEK SMARTVIEW) test strip Check sugar 6 x daily (Patient taking differently: Check sugar 6 x daily. Uses true test instead) 200  each 3   HUMALOG KWIKPEN 200 UNIT/ML KwikPen SMARTSIG:10-50 Unit(s) SUB-Q     Insulin Disposable Pump (OMNIPOD DASH PODS, GEN 4,) MISC 200 Units every other day. Humalog     Insulin Infusion Pump (DEXCOM G6 BASAL-IQ INSULIN PMP) DEVI by Does not apply route.     Insulin Pen Needle (INSUPEN PEN NEEDLES) 32G X 4 MM MISC BD Pen Needles- brand specific. Inject insulin via insulin pen daily 200 each 3   ivabradine (CORLANOR) 5 MG TABS tablet Take 1 tablet (5 mg total) by mouth 2 (two) times daily with a meal. 180 tablet 3   Lancet Devices (ACCU-CHEK SOFTCLIX) lancets Use as instructed for blood glucose checks four times daily, before meals and at bedtime 100 each 5   medroxyPROGESTERone (DEPO-PROVERA) 150 MG/ML injection Inject 150 mg into the muscle every 3 (three) months.     montelukast (SINGULAIR) 10 MG tablet Take 10 mg by mouth at bedtime.     Multiple Vitamin (MULTI-VITAMIN) tablet Take 1 tablet by mouth daily.     nortriptyline (PAMELOR) 10 MG capsule Take 20 mg by mouth at bedtime.     NURTEC 75 MG TBDP Take 1 tablet by mouth daily.     ondansetron (ZOFRAN-ODT) 4 MG disintegrating tablet Take 1 tablet (4 mg total) by mouth every 8 (eight) hours as needed for nausea or vomiting. 20 tablet 0   pantoprazole (PROTONIX) 40 MG tablet Take 40 mg by mouth daily.     potassium chloride (KLOR-CON M) 10 MEQ tablet Take 1 tablet by mouth  as needed.     rosuvastatin (CRESTOR) 20 MG tablet Take 1 tablet (20 mg total) by mouth daily at 6 PM. 30 tablet 0   sacubitril-valsartan (ENTRESTO) 97-103 MG Take 0.5 tablets by mouth 2 (two) times daily. 60 tablet 3   topiramate (TOPAMAX) 100 MG tablet Take 100 mg by mouth 2 (two) times daily.     TRUEPLUS LANCETS 33G MISC USE TO CHECK BLOOD GLUCOSE QID - AFTER MEALS AND AT BEDTIME  3   VENTOLIN HFA 108 (90 BASE) MCG/ACT inhaler Use as directed 2 puffs in the mouth or throat 4 (four) times daily as needed for shortness of breath.   3   furosemide (LASIX) 40 MG tablet  Take 1 tablet (40 mg total) by mouth daily as needed for fluid or edema. 90 tablet 3   No facility-administered medications prior to visit.   Final Medications at End of Visit    Current Meds  Medication Sig   apixaban (ELIQUIS) 5 MG TABS tablet Take 1 tablet (5 mg total) by mouth 2 (two) times daily. Resume on 11/2 morning   Baclofen 5 MG TABS Take 5-10 mg by mouth daily as needed (Migraines).   Blood Glucose Monitoring Suppl (ACCU-CHEK NANO SMARTVIEW) W/DEVICE KIT 1 Device by Does not apply route 4 (four) times daily - after meals and at bedtime.   butalbital-acetaminophen-caffeine (FIORICET) 50-325-40 MG tablet Take 1 tablet by mouth as needed.   carvedilol (COREG) 25 MG tablet Take 2 tablets (50 mg total) by mouth 2 (two) times daily with a meal.   Continuous Blood Gluc Sensor (DEXCOM G6 SENSOR) MISC SMARTSIG:1 Topical Every 10 Days   Continuous Blood Gluc Transmit (DEXCOM G6 TRANSMITTER) MISC See admin instructions.   Dulaglutide 1.5 MG/0.5ML SOPN Inject 1.5 mg into the skin once a week.   escitalopram (LEXAPRO) 10 MG tablet Take 10 mg by mouth daily.   fexofenadine (ALLEGRA) 180 MG tablet Take 180 mg by mouth daily.   FLOVENT HFA 110 MCG/ACT inhaler Inhale 2 puffs into the lungs 2 (two) times daily.   fluticasone (FLONASE) 50 MCG/ACT nasal spray Place 2 sprays into both nostrils daily as needed for allergies.    glucose blood (ACCU-CHEK SMARTVIEW) test strip Check sugar 6 x daily (Patient taking differently: Check sugar 6 x daily. Uses true test instead)   HUMALOG KWIKPEN 200 UNIT/ML KwikPen SMARTSIG:10-50 Unit(s) SUB-Q   Insulin Disposable Pump (OMNIPOD DASH PODS, GEN 4,) MISC 200 Units every other day. Humalog   Insulin Infusion Pump (DEXCOM G6 BASAL-IQ INSULIN PMP) DEVI by Does not apply route.   Insulin Pen Needle (INSUPEN PEN NEEDLES) 32G X 4 MM MISC BD Pen Needles- brand specific. Inject insulin via insulin pen daily   ivabradine (CORLANOR) 5 MG TABS tablet Take 1 tablet (5 mg  total) by mouth 2 (two) times daily with a meal.   Lancet Devices (ACCU-CHEK SOFTCLIX) lancets Use as instructed for blood glucose checks four times daily, before meals and at bedtime   medroxyPROGESTERone (DEPO-PROVERA) 150 MG/ML injection Inject 150 mg into the muscle every 3 (three) months.   montelukast (SINGULAIR) 10 MG tablet Take 10 mg by mouth at bedtime.   Multiple Vitamin (MULTI-VITAMIN) tablet Take 1 tablet by mouth daily.   nortriptyline (PAMELOR) 10 MG capsule Take 20 mg by mouth at bedtime.   NURTEC 75 MG TBDP Take 1 tablet by mouth daily.   ondansetron (ZOFRAN-ODT) 4 MG disintegrating tablet Take 1 tablet (4 mg total) by mouth every 8 (eight) hours  as needed for nausea or vomiting.   pantoprazole (PROTONIX) 40 MG tablet Take 40 mg by mouth daily.   potassium chloride (KLOR-CON M) 10 MEQ tablet Take 1 tablet by mouth as needed.   rosuvastatin (CRESTOR) 20 MG tablet Take 1 tablet (20 mg total) by mouth daily at 6 PM.   sacubitril-valsartan (ENTRESTO) 97-103 MG Take 0.5 tablets by mouth 2 (two) times daily.   topiramate (TOPAMAX) 100 MG tablet Take 100 mg by mouth 2 (two) times daily.   TRUEPLUS LANCETS 33G MISC USE TO CHECK BLOOD GLUCOSE QID - AFTER MEALS AND AT BEDTIME   VENTOLIN HFA 108 (90 BASE) MCG/ACT inhaler Use as directed 2 puffs in the mouth or throat 4 (four) times daily as needed for shortness of breath.    [DISCONTINUED] furosemide (LASIX) 40 MG tablet Take 1 tablet (40 mg total) by mouth daily as needed for fluid or edema.   Radiology:   No results found. CT Code Stroke CTA Head W/WO contrast 03/27/2020 1. Negative for large vessel occlusion but positive for Right MCA M3 occlusion. CT Perfusion detects core infarct with minimally larger penumbra at the right insula/operculum corresponding to the plain CT finding.  2. Additionally, mild vessel irregularity is noted in multiple other circle-of-Willis branches, including the left A1, right P2, left M2. Although nonspecific  this constellation of clinical and imaging findings might indicate accelerated branch vessel atherosclerosis. Although other large vessels appear normal, with no atherosclerosis in the neck or at the aortic arch.   MR ANGIO HEAD WO CONTRAST  03/27/2020  1. Moderate-sized acute right MCA infarct. 2. Small subacute left parieto-occipital infarcts. 3. Right M3 branch occlusion, otherwise negative head [id number]   CT ABDOMEN PELVIS W CONTRAST  03/27/2020  1. New 18 mm hypoattenuating lesion upper pole right kidney with areas of subcapsular multifocal decreased enhancement in the lower pole right kidney. Imaging features are nonspecific and could be related to pyelonephritis and phlegmon/evolving abscess in the upper pole right kidney cannot be excluded. Alternatively, multifocal right renal infarct could have this appearance. Correlation with urinalysis may prove helpful.  2. Relatively well-defined 2.2 cm lesion in the inferior lingula measures water density. This may be loculated pleural fluid or intraparenchymal fluid collection in this patient with a history of pulmonary embolus and bilateral ground-glass airspace disease on the previous study suspicious for multifocal atypical/viral pneumonia. Consider follow-up to ensure resolution.  Cardiac Studies:  Left heart catheterization 03/27/2021:  LM: Normal LAD: Normal         Prox diag 1 30% disease Lcx: Normal RCA: Normal   Mildly decompensated nonischemic cardiomyopathy Continue GDMT for HFrEF  PCV ECHOCARDIOGRAM COMPLETE 04/12/2021 Left ventricle cavity is normal in size. Normal left ventricular wall thickness. Severe global hypokinesis. LVEF 25-30%. Indeterminate diastolic filling pattern. No significant valvular abnormality. Normal right atrial pressure. No significant change compared to previous study on 07/25/2020.  PCV MYOCARDIAL PERFUSION WITH LEXISCAN 08/14/2020 Lexiscan nuclear stress test performed using 1-day protocol. Left ventricle  is dilated. Severe decrease in global myocardial thickening and wall motion. Stress LVEF <20%. Large sized, medium intensity, fixed perfusion defect in anteroapical, apical, inferior, inferoseptal myocardium. High risk study   LONG TERM MONITOR (8-14 DAYS) 06/22/20-07/06/20:  Predominant underlying rhythm was sinus with minimum heart rate of 71 bpm, maximum heart rate of 193 bpm average heart rate of 91 bpm.  There were no patient triggered events.  PACs and PVCs were rare.  Patient with intermittent right bundle branch block.  2 episodes of  supraventricular tachycardia with the longest lasting 12 seconds and a maximum heart rate of 93 bpm.  No evidence of atrial fibrillation.  No ventricular tachycardia, high degree AV block, pauses >3 seconds.  EKG:  09/20/2021: Sinus rhythm at a rate of 76 bpm.  Right atrial enlargement.  IVCD.  Poor R wave progression, cannot exclude anteroseptal infarct old.  03/05/2021: Sinus rhythm at a rate of 62 bpm, new left bundle branch block, no further analysis.  EKG 04/18/2020: Sinus rhythm at a rate of 90 bpm. Left axis deviation. PRWP, cannot exclude anteroseptal infarct old. Non-specific T wave abnormality.   EKG 03/27/2020: Sinus tachycardia at rate of 102 bpm, normal axis, borderline criteria for LVH.  Borderline QT elongation.  Compared to 06/30/2019, sinus tachycardia new.  No change in QT elongation.  Assessment     ICD-10-CM   1. Nonischemic cardiomyopathy (HCC)  I42.8 Ambulatory referral to Cardiac Electrophysiology    Basic metabolic panel    Pro b natriuretic peptide (BNP)[facility] [place] [facility])    2. Chronic systolic heart failure (HCC)  I50.22 Ambulatory referral to Cardiac Electrophysiology      Medications Discontinued During This Encounter  Medication Reason   furosemide (LASIX) 40 MG tablet      Meds ordered this encounter  Medications   furosemide (LASIX) 40 MG tablet    Sig: Take 1 tablet (40 mg total) by mouth daily.     Dispense:  90 tablet    Refill:  3    Recommendations:   ESTELENE CARMACK is a 41 y.o. African-American female with hypertension, hyperlipidemia, uncontrolled diabetes mellitus, obesity and obstructive sleep apnea and history of Covid pneumonia in January 2021 and also had pulmonary embolism during Covid infection.  She is also being evaluated for vasculitis. 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 Patient underwent nuclear stress test which revealed large medium intensity fixed perfusion defect, suggestive of ischemic etiology for cardiomyopathy.  She subsequently underwent cardiac catheterization which revealed no significant CAD, therefore myopathy is nonischemic.  Notably in the past patient has been unable tolerate [place] due to recurrent yeast infections.  Patient was last seen in the office 12/12/2021 with complaints of dizziness and dyspnea.  At that time patient's blood pressure was low and suspected symptoms as well as elevated creatinine to be related to hypovolemia.  At last visit stopped both spironolactone and daily Lasix, advised patient to take Lasix as needed.  He now presents for 1 week follow-up.  BNP is elevated however creatinine has returned to essentially baseline.    His blood pressure has improved with discontinuation of spironolactone and dizziness has essentially resolved.  We will resume Lasix 40 mg p.o. daily as renal function has also improved, however patient does continue to have dyspnea on exertion.  Patient will continue to monitor symptoms and blood pressure notify our office symptoms worsen or fail to improve.  Given persistent LVEF <30% will refer patient to electrophysiology for evaluation of BiV ICD.  Follow-up in 3 months, sooner if needed.   Alethia Berthold, PA-C 12/18/2021, 9:50 AM Office: [phone]

## 2022-01-08 ENCOUNTER — Other Ambulatory Visit: Payer: Self-pay | Admitting: Cardiology

## 2022-01-08 DIAGNOSIS — I420 Dilated cardiomyopathy: Secondary | ICD-10-CM

## 2022-01-08 DIAGNOSIS — I5022 Chronic systolic (congestive) heart failure: Secondary | ICD-10-CM

## 2022-01-29 ENCOUNTER — Other Ambulatory Visit: Payer: Self-pay | Admitting: Cardiology

## 2022-01-29 ENCOUNTER — Encounter: Payer: Self-pay | Admitting: Internal Medicine

## 2022-01-29 ENCOUNTER — Ambulatory Visit: Payer: 59 | Attending: Internal Medicine | Admitting: Internal Medicine

## 2022-01-29 VITALS — BP 140/82 | HR 74 | Ht 61.0 in | Wt 245.0 lb

## 2022-01-29 DIAGNOSIS — I5022 Chronic systolic (congestive) heart failure: Secondary | ICD-10-CM | POA: Diagnosis not present

## 2022-01-29 DIAGNOSIS — I428 Other cardiomyopathies: Secondary | ICD-10-CM

## 2022-01-29 DIAGNOSIS — N280 Ischemia and infarction of kidney: Secondary | ICD-10-CM

## 2022-01-29 DIAGNOSIS — D6869 Other thrombophilia: Secondary | ICD-10-CM

## 2022-01-29 DIAGNOSIS — Z86711 Personal history of pulmonary embolism: Secondary | ICD-10-CM

## 2022-01-29 MED ORDER — SPIRONOLACTONE 25 MG PO TABS: 12.5000 mg | ORAL_TABLET | Freq: Every day | ORAL | 3 refills | Status: DC

## 2022-01-29 NOTE — Progress Notes (Signed)
     ICD-10-CM   1. Secondary hypercoagulable state (HCC)  D68.69 Ambulatory referral to Hematology / Oncology    2. Nonischemic cardiomyopathy (HCC)  I42.8 MR CARDIAC MORPHOLOGY W WO CONTRAST    PCV ECHOCARDIOGRAM COMPLETE    3. Chronic systolic heart failure (HCC)  D74.12 MR CARDIAC MORPHOLOGY W WO CONTRAST    PCV ECHOCARDIOGRAM COMPLETE    4. History of pulmonary embolus (PE)  Z86.711 Ambulatory referral to Hematology / Oncology    5. Renal infarct (HCC)  N28.0 Ambulatory referral to Hematology / Oncology     Orders Placed This Encounter  Procedures   MR CARDIAC MORPHOLOGY W WO CONTRAST    Standing Status:   Future    Standing Expiration Date:   01/30/2023    Order Specific Question:   If indicated for the ordered procedure, I authorize the administration of contrast media per Radiology protocol    Answer:   Yes    Order Specific Question:   What is the patient's sedation requirement?    Answer:   No Sedation    Order Specific Question:   Does the patient have a pacemaker or implanted devices?    Answer:   No    Order Specific Question:   Preferred imaging location?    Answer:   [facility] (table limit - 500 lbs)   Ambulatory referral to Hematology / Oncology    Referral Priority:   Routine    Referral Type:   Consultation    Referral Reason:   Specialty Services Required    Requested Specialty:   Oncology    Number of Visits Requested:   1   PCV ECHOCARDIOGRAM COMPLETE    Standing Status:   Future    Standing Expiration Date:   01/30/2023    Reviewed the consult from Dr. [name], orders for echo, cardiac MR and hematology consult done.    Yates Decamp, MD, [facility] 01/29/2022, 4:56 PM Office: [phone] Fax: [phone] Pager: [phone]

## 2022-01-29 NOTE — Patient Instructions (Addendum)
Medication Instructions:  Your physician has recommended you make the following change in your medication:   ** Begin Spirolactone 25mg  - 1/2 tablet (12.5mg ) by mouth daily   *If you need a refill on your cardiac medications before your next appointment, please call your pharmacy*   Lab Work: None ordered.  If you have labs (blood work) drawn today and your tests are completely normal, you will receive your results only by: MyChart Message (if you have MyChart) OR A paper copy in the mail If you have any lab test that is abnormal or we need to change your treatment, we will call you to review the results.   Testing/Procedures: None ordered.    Follow-Up: At [facility], you and your health needs are our priority.  As part of our continuing mission to provide you with exceptional heart care, we have created designated Provider Care Teams.  These Care Teams include your primary Cardiologist (physician) and Advanced Practice Providers (APPs -  Physician Assistants and Nurse Practitioners) who all work together to provide you with the care you need, when you need it.  We recommend signing up for the patient portal called "MyChart".  Sign up information is provided on this After Visit Summary.  MyChart is used to connect with patients for Virtual Visits (Telemedicine).  Patients are able to view lab/test results, encounter notes, upcoming appointments, etc.  Non-urgent messages can be sent to your provider as well.   To learn more about what you can do with MyChart, go to [facility].    Your next appointment:    04/23/2022 at 315pm with Dr 04/25/2022  Important Information About Sugar

## 2022-01-29 NOTE — Progress Notes (Signed)
ELECTROPHYSIOLOGY CONSULT NOTE  Patient ID: Shelly Shields, MRN: [id number], DOB/AGE: 1981/01/16 41 y.o. Admit date: (Not on file) Date of Consult: 01/29/2022  Primary Physician: Shelly Berger, MD Primary Cardiologist: Shelly Shields is a 41 y.o. female who is being seen today for the evaluation of ICD  at the request of Shelly Shields.    HPI Shelly Shields is a 41 y.o. female referred for consideration of an ICD.  11/21 presented with dysarthria and right flank pain.  Imaging demonstrated a right renal infarct and then ischemic right capsular stroke;  at that time she was also found to have severe left ventricular dysfunction.  3/22 Myoview scanning demonstrated a fixed infarct.  Catheterization subsequently showed no obstructive disease  1/21 notes with COVID and 2/21 presented with bilateral pulmonary emboli taking contraceptive and "1 or 2 family members who passed away from PE " hypercoagulable work-up was negative; started on Eliquis  Treated sleep apnea: Diabetes with a history of DKA, hypertension PCOS all in the context of morbid obesity; there has been a concern regarding sarcoidosis  7/23 episodic lightheadedness, diuretics held including spironolactone with improvement in symptoms  Dyspnea on exertion at less than 100 feet.  No nocturnal dyspnea.  3 pillow orthopnea.  Occasional tachypalpitations.  Unrelated to exertion.  No syncope.  DATE TEST EF   2/21 Echo  50-55%  %   11/21 Echo  20-25% %   11/21 TEE  Small interatrial shunt  3/22 Echo  20-25%   11/22 LHC  Non obstructive CAD        Date Cr K Hgb  7/23 1.04 4.2 13.2          Event recorder 2/22 personally reviewed nonsustained atrial tachycardia  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 hauler    Diabetes mellitus without complication ([place])    Hypertension    Migraines    Obesity    Pulmonary embolism ([place]) 05/2019   with COVID   Sleep apnea       Surgical  History:  Past Surgical History:  Procedure Laterality Date   BILATERAL CARPAL TUNNEL RELEASE     LEFT HEART CATH AND CORONARY ANGIOGRAPHY N/A 03/27/2021   Procedure: LEFT HEART CATH AND CORONARY ANGIOGRAPHY;  Surgeon: Nigel Mormon, MD;  Location: [facility];  Service: Cardiovascular;  Laterality: N/A;   TEE WITHOUT CARDIOVERSION N/A 03/28/2020   Procedure: TRANSESOPHAGEAL ECHOCARDIOGRAM (TEE);  Surgeon: Adrian Prows, MD;  Location: [facility] ENDOSCOPY;  Service: Cardiovascular;  Laterality: N/A;     Home Meds: Current Meds  Medication Sig   apixaban (ELIQUIS) 5 MG TABS tablet Take 1 tablet (5 mg total) by mouth 2 (two) times daily. Resume on 11/2 morning   Baclofen 5 MG TABS Take 5-10 mg by mouth daily as needed (Migraines).   Blood Glucose Monitoring Suppl (ACCU-CHEK NANO SMARTVIEW) W/DEVICE KIT 1 Device by Does not apply route 4 (four) times daily - after meals and at bedtime.   butalbital-acetaminophen-caffeine (FIORICET) 50-325-40 MG tablet Take 1 tablet by mouth as needed.   carvedilol (COREG) 25 MG tablet TAKE 2 TABLETS(50 MG) BY MOUTH TWICE DAILY WITH A MEAL   Continuous Blood Gluc Sensor (DEXCOM G6 SENSOR) MISC SMARTSIG:1 Topical Every 10 Days   Continuous Blood Gluc Transmit (DEXCOM G6 TRANSMITTER) MISC See admin instructions.   Dulaglutide 1.5 MG/0.5ML SOPN Inject 1.5 mg into the skin once a  week.   escitalopram (LEXAPRO) 10 MG tablet Take 10 mg by mouth daily.   fexofenadine (ALLEGRA) 180 MG tablet Take 180 mg by mouth daily.   FLOVENT HFA 110 MCG/ACT inhaler Inhale 2 puffs into the lungs 2 (two) times daily.   fluticasone (FLONASE) 50 MCG/ACT nasal spray Place 2 sprays into both nostrils daily as needed for allergies.    furosemide (LASIX) 40 MG tablet Take 1 tablet (40 mg total) by mouth daily.   glucose blood (ACCU-CHEK SMARTVIEW) test strip Check sugar 6 x daily (Patient taking differently: Check sugar 6 x daily. Uses true test instead)   HUMALOG KWIKPEN 200 UNIT/ML  KwikPen SMARTSIG:10-50 Unit(s) SUB-Q   Insulin Disposable Pump (OMNIPOD DASH PODS, GEN 4,) MISC 200 Units every other day. Humalog   Insulin Infusion Pump (DEXCOM G6 BASAL-IQ INSULIN PMP) DEVI by Does not apply route.   Insulin Pen Needle (INSUPEN PEN NEEDLES) 32G X 4 MM MISC BD Pen Needles- brand specific. Inject insulin via insulin pen daily   ivabradine (CORLANOR) 5 MG TABS tablet Take 1 tablet (5 mg total) by mouth 2 (two) times daily with a meal.   Lancet Devices (ACCU-CHEK SOFTCLIX) lancets Use as instructed for blood glucose checks four times daily, before meals and at bedtime   medroxyPROGESTERone (DEPO-PROVERA) 150 MG/ML injection Inject 150 mg into the muscle every 3 (three) months.   montelukast (SINGULAIR) 10 MG tablet Take 10 mg by mouth at bedtime.   Multiple Vitamin (MULTI-VITAMIN) tablet Take 1 tablet by mouth daily.   nortriptyline (PAMELOR) 10 MG capsule Take 20 mg by mouth at bedtime.   NURTEC 75 MG TBDP Take 1 tablet by mouth daily.   ondansetron (ZOFRAN-ODT) 4 MG disintegrating tablet Take 1 tablet (4 mg total) by mouth every 8 (eight) hours as needed for nausea or vomiting.   pantoprazole (PROTONIX) 40 MG tablet Take 40 mg by mouth daily.   potassium chloride (KLOR-CON M) 10 MEQ tablet Take 1 tablet by mouth as needed.   rosuvastatin (CRESTOR) 20 MG tablet Take 1 tablet (20 mg total) by mouth daily at 6 PM.   sacubitril-valsartan (ENTRESTO) 97-103 MG Take 0.5 tablets by mouth 2 (two) times daily.   spironolactone (ALDACTONE) 25 MG tablet Take 0.5 tablets (12.5 mg total) by mouth daily.   topiramate (TOPAMAX) 100 MG tablet Take 100 mg by mouth 2 (two) times daily.   TRUEPLUS LANCETS 33G MISC USE TO CHECK BLOOD GLUCOSE QID - AFTER MEALS AND AT BEDTIME   VENTOLIN HFA 108 (90 BASE) MCG/ACT inhaler Use as directed 2 puffs in the mouth or throat 4 (four) times daily as needed for shortness of breath.     Allergies:  Allergies  Allergen Reactions   Lisinopril Cough    Metformin Diarrhea   Farxiga [Dapagliflozin]     Recurrent yeast infections   Ibuprofen Hives   Ketorolac Hives    Social History   Socioeconomic History   Marital status: Single    Spouse name: Not on file   Number of children: 0   Years of education: Not on file   Highest education level: Not on file  Occupational History   Occupation: pharmacy tech  Tobacco Use   Smoking status: Never   Smokeless tobacco: Never  Vaping Use   Vaping Use: Never used  Substance and Sexual Activity   Alcohol use: Yes    Alcohol/week: 0.0 standard drinks of alcohol    Comment: occasional    Drug use: No   Sexual  activity: Not on file  Other Topics Concern   Not on file  Social History Narrative   Lives in [place]: School and working   Social Determinants of Radio broadcast assistant Strain: Not on file  Food Insecurity: Not on file  Transportation Needs: Not on file  Physical Activity: Not on file  Stress: Not on file  Social Connections: Not on file  Intimate Partner Violence: Not on file     Family History  Problem Relation Age of Onset   Hypertension Mother    Thyroid disease Mother    Hypertension Father    Stroke Father 23   Diabetes Father    Sleep apnea Father    Aneurysm Paternal Aunt 68   CVA Paternal Uncle 11   Heart failure Paternal Grandfather      ROS:  Please see the history of present illness.    All other systems reviewed and negative.    Physical Exam:\ Blood pressure (!) 140/82, pulse 74, height $RemoveBe'5\' 1"'rjMtoYmfa$  (1.549 m), weight 245 lb (111.1 kg), SpO2 92 %. General: Well developed, Morbidly obese  female in no acute distress. Head: Normocephalic, atraumatic, sclera non-icteric, no xanthomas, nares are without discharge. EENT: normal  Lymph Nodes:  none Neck: Negative for carotid bruits. JVD 10 Back:without scoliosis kyphosis Lungs: Clear bilaterally to auscultation without wheezes, rales, or rhonchi. Breathing is unlabored. Heart: RRR with S1  S2. No  murmur . No rubs, or gallops appreciated. Abdomen: Soft, non-tender, non-distended with normoactive bowel sounds. No hepatomegaly. No rebound/guarding. No obvious abdominal masses. Msk:  Strength and tone appear normal for age. Extremities: No clubbing or cyanosis. N0 edema.  Distal pedal pulses are 2+ and equal bilaterally. Skin: Warm and Dry Neuro: Alert and oriented X 3. CN III-XII intact Grossly normal sensory and motor function .  Dysarthric Psych:  Responds to questions appropriately with a normal affect.        EKG: Sinus at 74 Interval 16/15/49 Axis left -40   Assessment and Plan:  Nonischemic cardiomyopathy  Congestive heart failure-chronic-systolic grade 3B  [id number] pneumonia//pulmonary emboli  CVA-embolic//kidney infarcts  Hypertension  Left bundle branch block  Asthma  Presents with nonischemic cardiomyopathy no recent LV function assessment and ongoing symptoms of  dyspnea which is undoubtedly multifactorial related to obesity, conditioning, asthma but also to congestive heart failure.  She has left bundle branch block which emerged following the initial presentation with the multiple embolic events.  Myoview scan 3/22 showed a perfusion defect in a catheterization a few months later failed to demonstrate obstructive disease; I wonder whether she did not have an embolism to her coronary artery bed as well, all occurring in the temp context of stroke and renal infarct.  She is anticoagulated.  I also wonder whether she would benefit from evaluation by hematology/coagulation services either locally or [facility].  With her left bundle branch block and congestive symptoms, she is reasonably considered for CRT.  It is not clear however whether that would be CRT-P or CRT-D as there is not been LV function assessment since 3/22.  We will discuss with Dr. Lavone Nian and asked to think about cMRI impressions do an ultrasound as the former may give [place] further information as to what  happened to her myocardium  We will resume her spironolactone now that she is hypertensive again.  She also needs augmented diuresis and hopefully this will help in conjunction with her furosemide.  I encouraged her to follow-up with her pulmonary team  to ask whether some of her dyspnea is not consequential to her COVID-19 pneumonia.  We have discussed the potential risks and benefits of device implantation including but not limited to perforation of lung and/or heart, infection lead dislodgment and inappropriate shocks.  She would like to proceed once we have sufficient information.   Virl Axe

## 2022-01-30 ENCOUNTER — Telehealth: Payer: Self-pay | Admitting: Hematology

## 2022-01-30 NOTE — Telephone Encounter (Signed)
Scheduled appt per 9/5 referral. Pt is aware of appt date and time. Pt is aware to arrive 15 mins prior to appt time and to bring and updated insurance card. Pt is aware of appt location.   

## 2022-02-25 ENCOUNTER — Encounter: Payer: Self-pay | Admitting: Hematology

## 2022-02-25 ENCOUNTER — Inpatient Hospital Stay: Payer: 59 | Attending: Hematology | Admitting: Hematology

## 2022-02-25 ENCOUNTER — Inpatient Hospital Stay: Payer: 59

## 2022-02-25 VITALS — BP 135/93 | HR 87 | Temp 98.1°F | Resp 18 | Ht 61.0 in | Wt 249.6 lb

## 2022-02-25 DIAGNOSIS — I2699 Other pulmonary embolism without acute cor pulmonale: Secondary | ICD-10-CM

## 2022-02-25 DIAGNOSIS — Z7901 Long term (current) use of anticoagulants: Secondary | ICD-10-CM | POA: Diagnosis not present

## 2022-02-25 DIAGNOSIS — Z800 Family history of malignant neoplasm of digestive organs: Secondary | ICD-10-CM | POA: Insufficient documentation

## 2022-02-25 DIAGNOSIS — Z8616 Personal history of COVID-19: Secondary | ICD-10-CM | POA: Insufficient documentation

## 2022-02-25 DIAGNOSIS — I10 Essential (primary) hypertension: Secondary | ICD-10-CM | POA: Diagnosis not present

## 2022-02-25 DIAGNOSIS — E119 Type 2 diabetes mellitus without complications: Secondary | ICD-10-CM | POA: Diagnosis not present

## 2022-02-25 DIAGNOSIS — Z801 Family history of malignant neoplasm of trachea, bronchus and lung: Secondary | ICD-10-CM | POA: Diagnosis not present

## 2022-02-25 DIAGNOSIS — Z8673 Personal history of transient ischemic attack (TIA), and cerebral infarction without residual deficits: Secondary | ICD-10-CM | POA: Diagnosis not present

## 2022-02-25 DIAGNOSIS — J45909 Unspecified asthma, uncomplicated: Secondary | ICD-10-CM | POA: Diagnosis not present

## 2022-02-25 DIAGNOSIS — Z86711 Personal history of pulmonary embolism: Secondary | ICD-10-CM | POA: Insufficient documentation

## 2022-02-25 DIAGNOSIS — G4730 Sleep apnea, unspecified: Secondary | ICD-10-CM | POA: Diagnosis not present

## 2022-02-25 DIAGNOSIS — Z6841 Body Mass Index (BMI) 40.0 and over, adult: Secondary | ICD-10-CM | POA: Insufficient documentation

## 2022-02-25 LAB — CMP (CANCER CENTER ONLY)
ALT: 21 U/L (ref 0–44)
AST: 15 U/L (ref 15–41)
Albumin: 3.8 g/dL (ref 3.5–5.0)
Alkaline Phosphatase: 78 U/L (ref 38–126)
Anion gap: 6 (ref 5–15)
BUN: 14 mg/dL (ref 6–20)
CO2: 27 mmol/L (ref 22–32)
Calcium: 9 mg/dL (ref 8.9–10.3)
Chloride: 110 mmol/L (ref 98–111)
Creatinine: 0.97 mg/dL (ref 0.44–1.00)
GFR, Estimated: >60 mL/min (ref >60)
Glucose, Bld: 115 mg/dL — ABNORMAL HIGH (ref 70–99)
Potassium: 3.8 mmol/L (ref 3.5–5.1)
Sodium: 143 mmol/L (ref 135–145)
Total Bilirubin: 0.5 mg/dL (ref 0.3–1.2)
Total Protein: 6.6 g/dL (ref 6.5–8.1)

## 2022-02-25 LAB — CBC WITH DIFFERENTIAL (CANCER CENTER ONLY)
Abs Immature Granulocytes: 0.02 10*3/uL (ref 0.00–0.07)
Basophils Absolute: 0.1 10*3/uL (ref 0.0–0.1)
Basophils Relative: 1 %
Eosinophils Absolute: 0.1 10*3/uL (ref 0.0–0.5)
Eosinophils Relative: 1 %
HCT: 38.7 % (ref 36.0–46.0)
Hemoglobin: 12.6 g/dL (ref 12.0–15.0)
Immature Granulocytes: 0 %
Lymphocytes Relative: 35 %
Lymphs Abs: 3.5 10*3/uL (ref 0.7–4.0)
MCH: 29.1 pg (ref 26.0–34.0)
MCHC: 32.6 g/dL (ref 30.0–36.0)
MCV: 89.4 fL (ref 80.0–100.0)
Monocytes Absolute: 0.5 10*3/uL (ref 0.1–1.0)
Monocytes Relative: 5 %
Neutro Abs: 5.9 10*3/uL (ref 1.7–7.7)
Neutrophils Relative %: 58 %
Platelet Count: 343 10*3/uL (ref 150–400)
RBC: 4.33 MIL/uL (ref 3.87–5.11)
RDW: 13.1 % (ref 11.5–15.5)
WBC Count: 10.1 10*3/uL (ref 4.0–10.5)
nRBC: 0 % (ref 0.0–0.2)

## 2022-02-25 LAB — ANTITHROMBIN III: AntiThromb III Func: 102 % (ref 75–120)

## 2022-02-26 LAB — LUPUS ANTICOAGULANT PANEL
DRVVT: 46 s (ref 0.0–47.0)
PTT Lupus Anticoagulant: 30.3 s (ref 0.0–43.5)

## 2022-02-26 LAB — CARDIOLIPIN ANTIBODIES, IGG, IGM, IGA
Anticardiolipin IgA: <9 APL U/mL (ref 0–11)
Anticardiolipin IgG: <9 GPL U/mL (ref 0–14)
Anticardiolipin IgM: <9 MPL U/mL (ref 0–12)

## 2022-02-26 LAB — PROTEIN S ACTIVITY: Protein S Activity: 108 % (ref 63–140)

## 2022-02-26 LAB — HOMOCYSTEINE: Homocysteine: 7.1 umol/L (ref 0.0–14.5)

## 2022-02-26 LAB — PROTEIN S, TOTAL: Protein S Ag, Total: 121 % (ref 60–150)

## 2022-02-26 LAB — PROTEIN C ACTIVITY: Protein C Activity: 87 % (ref 73–180)

## 2022-02-27 LAB — BETA-2-GLYCOPROTEIN I ABS, IGG/M/A
Beta-2 Glyco I IgG: <9 GPI IgG units (ref 0–20)
Beta-2-Glycoprotein I IgA: <9 GPI IgA units (ref 0–25)
Beta-2-Glycoprotein I IgM: <9 GPI IgM units (ref 0–32)

## 2022-02-27 LAB — PROTEIN C, TOTAL: Protein C, Total: 83 % (ref 60–150)

## 2022-03-01 LAB — FACTOR 5 LEIDEN

## 2022-03-01 LAB — PROTHROMBIN GENE MUTATION

## 2022-03-04 ENCOUNTER — Telehealth: Payer: Self-pay | Admitting: Hematology

## 2022-03-04 NOTE — Telephone Encounter (Signed)
Scheduled per 10/02 los, patient has been called and voicemail was left. 

## 2022-03-04 NOTE — Progress Notes (Addendum)
Marland Kitchen   HEMATOLOGY/ONCOLOGY CONSULTATION NOTE  Date of Service: 03/04/2022  Patient Care Team: [name], MD as PCP - General (Family Medicine)  CHIEF COMPLAINTS/PURPOSE OF CONSULTATION:  Extensive bilateral PE in February 2021 with right heart strain.  HISTORY OF PRESENTING ILLNESS:   Shelly Shields is a wonderful 41 y.o. female who has been referred to [place] by Dr .Gates Rigg MD \ for evaluation of previous history of bilateral extensive pulmonary embolism. Patient has a history of asthma, diabetes type 2, hypertension, migraine headaches, morbid obesity .Body mass index is 47.16 kg/m.,  Sleep apnea.  On CPAP.  Patient was admitted to the hospital with severe COVID-19 infection in February 2021 when she went in with shortness of breath and fevers.  Was noted to have COVID 19 pneumonia along with extensive bilateral pulmonary emboli  She was also hospitalized in November 2426 with embolic CVA and infarcts to her kidney and new onset dilated cardiomyopathy with severe LV dysfunction.  She follows with Dr. Virgina Jock for her nonischemic cardiomyopathy   Pulmonary emboli at that time in February 2021 were thought to be due to severe COVID-19 infection as well as her morbid obesity, relative immobility and use of Depo-Provera shots. She was recommended to get off the Depo-Provera shots but has not done this yet with her OB/GYN doctor.  Currently patient is also following with Dr. Virgina Jock for management of her nonischemic cardiomyopathy.  She continues to be on anticoagulation/Eliquis by her cardiologist..  Patient denies any family history of blood clotting disorder.   Patient currently notes no acute chest pain or shortness of breath.  No leg pain or swelling.  MEDICAL HISTORY:  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 hauler    Diabetes mellitus without complication ([place])    Hypertension    Migraines    Obesity    Pulmonary embolism ([place])  05/2019   with COVID   Sleep apnea     SURGICAL HISTORY: Past Surgical History:  Procedure Laterality Date   BILATERAL CARPAL TUNNEL RELEASE     LEFT HEART CATH AND CORONARY ANGIOGRAPHY N/A 03/27/2021   Procedure: LEFT HEART CATH AND CORONARY ANGIOGRAPHY;  Surgeon: Nigel Mormon, MD;  Location: [facility];  Service: Cardiovascular;  Laterality: N/A;   TEE WITHOUT CARDIOVERSION N/A 03/28/2020   Procedure: TRANSESOPHAGEAL ECHOCARDIOGRAM (TEE);  Surgeon: Adrian Prows, MD;  Location: [facility] ENDOSCOPY;  Service: Cardiovascular;  Laterality: N/A;    SOCIAL HISTORY: Social History   Socioeconomic History   Marital status: Single    Spouse name: Not on file   Number of children: 0   Years of education: Not on file   Highest education level: Not on file  Occupational History   Occupation: pharmacy tech  Tobacco Use   Smoking status: Never   Smokeless tobacco: Never  Vaping Use   Vaping Use: Never used  Substance and Sexual Activity   Alcohol use: Yes    Alcohol/week: 0.0 standard drinks of alcohol    Comment: occasional    Drug use: No   Sexual activity: Not on file  Other Topics Concern   Not on file  Social History Narrative   Lives in [place]: School and working   Social Determinants of Radio broadcast assistant Strain: Not on file  Food Insecurity: Not on file  Transportation Needs: Not on file  Physical Activity: Not on file  Stress: Not on file  Social  Connections: Not on file  Intimate Partner Violence: Not on file    FAMILY HISTORY: Family History  Problem Relation Age of Onset   Hypertension Mother    Thyroid disease Mother    Hypertension Father    Stroke Father 60   Diabetes Father    Sleep apnea Father    Cancer - Lung Maternal Grandmother    Heart failure Paternal Grandfather    Aneurysm Paternal Aunt 68   CVA Paternal Uncle 2   Colon cancer Maternal Uncle 31    ALLERGIES:  is allergic to lisinopril, metformin, farxiga  [dapagliflozin], ibuprofen, and ketorolac.  MEDICATIONS:  Current Outpatient Medications  Medication Sig Dispense Refill   apixaban (ELIQUIS) 5 MG TABS tablet Take 1 tablet (5 mg total) by mouth 2 (two) times daily. Resume on 11/2 morning 60 tablet 0   Baclofen 5 MG TABS Take 5-10 mg by mouth daily as needed (Migraines).     butalbital-acetaminophen-caffeine (FIORICET) 50-325-40 MG tablet Take 1 tablet by mouth as needed.     carvedilol (COREG) 25 MG tablet TAKE 2 TABLETS(50 MG) BY MOUTH TWICE DAILY WITH A MEAL 180 tablet 3   Continuous Blood Gluc Sensor (DEXCOM G6 SENSOR) MISC SMARTSIG:1 Topical Every 10 Days     Dulaglutide 1.5 MG/0.5ML SOPN Inject 1.5 mg into the skin once a week.     escitalopram (LEXAPRO) 10 MG tablet Take 10 mg by mouth daily.     fexofenadine (ALLEGRA) 180 MG tablet Take 180 mg by mouth daily.     FLOVENT HFA 110 MCG/ACT inhaler Inhale 2 puffs into the lungs 2 (two) times daily.     fluticasone (FLONASE) 50 MCG/ACT nasal spray Place 2 sprays into both nostrils daily as needed for allergies.   3   furosemide (LASIX) 40 MG tablet Take 1 tablet (40 mg total) by mouth daily. 90 tablet 3   HUMALOG KWIKPEN 200 UNIT/ML KwikPen SMARTSIG:10-50 Unit(s) SUB-Q     Insulin Disposable Pump (OMNIPOD DASH PODS, GEN 4,) MISC 200 Units every other day. Humalog     ivabradine (CORLANOR) 5 MG TABS tablet Take 1 tablet (5 mg total) by mouth 2 (two) times daily with a meal. 180 tablet 3   medroxyPROGESTERone (DEPO-PROVERA) 150 MG/ML injection Inject 150 mg into the muscle every 3 (three) months.     montelukast (SINGULAIR) 10 MG tablet Take 10 mg by mouth at bedtime.     Multiple Vitamin (MULTI-VITAMIN) tablet Take 1 tablet by mouth daily.     nortriptyline (PAMELOR) 10 MG capsule Take 20 mg by mouth at bedtime.     NURTEC 75 MG TBDP Take 1 tablet by mouth daily.     ondansetron (ZOFRAN-ODT) 4 MG disintegrating tablet Take 1 tablet (4 mg total) by mouth every 8 (eight) hours as needed for  nausea or vomiting. 20 tablet 0   pantoprazole (PROTONIX) 40 MG tablet Take 40 mg by mouth daily.     rosuvastatin (CRESTOR) 20 MG tablet Take 1 tablet (20 mg total) by mouth daily at 6 PM. 30 tablet 0   sacubitril-valsartan (ENTRESTO) 97-103 MG Take 0.5 tablets by mouth 2 (two) times daily. 60 tablet 3   spironolactone (ALDACTONE) 25 MG tablet Take 0.5 tablets (12.5 mg total) by mouth daily. 45 tablet 3   topiramate (TOPAMAX) 100 MG tablet Take 100 mg by mouth 2 (two) times daily.     VENTOLIN HFA 108 (90 BASE) MCG/ACT inhaler Use as directed 2 puffs in the mouth or throat 4 (  four) times daily as needed for shortness of breath.   3   Blood Glucose Monitoring Suppl (ACCU-CHEK NANO SMARTVIEW) W/DEVICE KIT 1 Device by Does not apply route 4 (four) times daily - after meals and at bedtime. 1 kit 0   Continuous Blood Gluc Transmit (DEXCOM G6 TRANSMITTER) MISC See admin instructions.     glucose blood (ACCU-CHEK SMARTVIEW) test strip Check sugar 6 x daily (Patient taking differently: Check sugar 6 x daily. Uses true test instead) 200 each 3   Insulin Infusion Pump (DEXCOM G6 BASAL-IQ INSULIN PMP) DEVI by Does not apply route. (Patient not taking: Reported on 02/25/2022)     Insulin Pen Needle (INSUPEN PEN NEEDLES) 32G X 4 MM MISC BD Pen Needles- brand specific. Inject insulin via insulin pen daily 200 each 3   Lancet Devices (ACCU-CHEK SOFTCLIX) lancets Use as instructed for blood glucose checks four times daily, before meals and at bedtime 100 each 5   potassium chloride (KLOR-CON M) 10 MEQ tablet Take 1 tablet by mouth as needed. (Patient not taking: Reported on 02/25/2022)     TRUEPLUS LANCETS 33G MISC USE TO CHECK BLOOD GLUCOSE QID - AFTER MEALS AND AT BEDTIME  3   No current facility-administered medications for this visit.    REVIEW OF SYSTEMS:   10 Point review of Systems was done is negative except as noted above.  PHYSICAL EXAMINATION: ECOG PERFORMANCE STATUS: 1 - Symptomatic but completely  ambulatory  . Vitals:   02/25/22 1427  BP: (!) 135/93  Pulse: 87  Resp: 18  Temp: 98.1 F (36.7 C)  SpO2: 98%   Filed Weights   02/25/22 1427  Weight: 249 lb 9.6 oz (113.2 kg)   .Body mass index is 47.16 kg/m.  GENERAL:alert, in no acute distress and comfortable SKIN: no acute rashes, no significant lesions EYES: conjunctiva are pink and non-injected, sclera anicteric OROPHARYNX: MMM, no exudates, no oropharyngeal erythema or ulceration NECK: supple, no JVD LYMPH:  no palpable lymphadenopathy in the cervical, axillary or inguinal regions LUNGS: clear to auscultation b/l with normal respiratory effort HEART: regular rate & rhythm ABDOMEN:  normoactive bowel sounds , non tender, not distended. Extremity: no pedal edema PSYCH: alert & oriented x 3 with fluent speech NEURO: no focal motor/sensory deficits  LABORATORY DATA:  I have reviewed the data as listed  .    Latest Ref Rng & Units 02/25/2022    3:44 PM 12/11/2021    5:12 PM 09/09/2021    1:47 PM  CBC  WBC 4.0 - 10.5 K/uL 10.1  8.6  9.5   Hemoglobin 12.0 - 15.0 g/dL 12.6  13.2  13.2   Hematocrit 36.0 - 46.0 % 38.7  41.2  40.3   Platelets 150 - 400 K/uL 343  295  264     .    Latest Ref Rng & Units 02/25/2022    3:44 PM 12/14/2021    9:42 AM 12/11/2021    5:12 PM  CMP  Glucose 70 - 99 mg/dL 115  199  152   BUN 6 - 20 mg/dL _0 Creatinine 0.44 - 1.00 mg/dL 0.97  1.04  1.35   Sodium 135 - 145 mmol/L 143  139  138   Potassium 3.5 - 5.1 mmol/L 3.8  4.2  3.9   Chloride 98 - 111 mmol/L 110  107  109   CO2 22 - 32 mmol/L _1 Calcium 8.9 - 10.3 mg/dL  9.0  9.2  8.8   Total Protein 6.5 - 8.1 g/dL 6.6     Total Bilirubin 0.3 - 1.2 mg/dL 0.5     Alkaline Phos 38 - 126 U/L 78     AST 15 - 41 U/L 15     ALT 0 - 44 U/L 21        RADIOGRAPHIC STUDIES: I have personally reviewed the radiological images as listed and agreed with the findings in the report. No results found.  ASSESSMENT & PLAN:    41 year old female with no family history of VTE with  #1 Extensive bilateral pulmonary emboli in February 2021 This was likely due to severe COVID-19 infection causing endothelial damage and inflammation. Other contributing factors were her morbid obesity .Body mass index is 47.16 kg/m.,  Relative sedentary lifestyle and use of Depo-Provera.  #2 history of embolic CVAs and renal infarct.  Likely cardioembolic source.  Patient is already on Eliquis for this by her primary care physician.  Plan -I reviewed the patient's chart in detail and confirmed all the information with her. -At this point the patient is not having any symptoms suggestive of DVT or PE at this time. -Recommended patient stay active and try to achieve ideal body weight. -We discussed pros and cons and patient is agreeable to send out a hypercoagulability work-up to rule out any other contributing factors to her PE other than the COVID-19 infection. -She will continue to follow with cardiology for continued Eliquis in the context of cardial embolic CVAs and nonischemic cardiomyopathy undergoing additional work-up. -At this point the patient does not appear to have an concern for an inherited thrombophilia. -Recommended to stay well-hydrated and use compression socks. -Continue to follow with cardiology to optimize her management of nonischemic cardiomyopathy  Orders Placed This Encounter  Procedures   CBC with Differential ([place] Only)    Standing Status:   Future    Number of Occurrences:   1    Standing Expiration Date:   02/26/2023   CMP ([place] only)    Standing Status:   Future    Number of Occurrences:   1    Standing Expiration Date:   02/26/2023   Antithrombin III    Standing Status:   Future    Number of Occurrences:   1    Standing Expiration Date:   02/26/2023   Protein C activity    Standing Status:   Future    Number of Occurrences:   1    Standing Expiration Date:   02/26/2023    Protein C, total    Standing Status:   Future    Number of Occurrences:   1    Standing Expiration Date:   02/26/2023   Protein S activity    Standing Status:   Future    Number of Occurrences:   1    Standing Expiration Date:   02/26/2023   Protein S, total    Standing Status:   Future    Number of Occurrences:   1    Standing Expiration Date:   02/26/2023   Lupus anticoagulant panel    Standing Status:   Future    Number of Occurrences:   1    Standing Expiration Date:   02/26/2023   Beta-2-glycoprotein i abs, IgG/M/A    Standing Status:   Future    Number of Occurrences:   1    Standing Expiration Date:   02/26/2023   Homocysteine, serum  Standing Status:   Future    Number of Occurrences:   1    Standing Expiration Date:   02/26/2023   Factor 5 leiden    Standing Status:   Future    Number of Occurrences:   1    Standing Expiration Date:   02/26/2023   Prothrombin gene mutation    Standing Status:   Future    Number of Occurrences:   1    Standing Expiration Date:   02/26/2023   Cardiolipin antibodies, IgG, IgM, IgA    Standing Status:   Future    Number of Occurrences:   1    Standing Expiration Date:   02/26/2023    Follow-up Labs today Phone with Dr. Irene Limbo in 2 weeks  The total time spent in the appointment was 60 minutes*.  All of the patient's questions were answered with apparent satisfaction. The patient knows to call the clinic with any problems, questions or concerns.   Sullivan Lone MD MS AAHIVMS [facility] Hematology/Oncology Physician [facility]  .*Total Encounter Time as defined by the Centers for Medicare and Medicaid Services includes, in addition to the face-to-face time of a patient visit (documented in the note above) non-face-to-face time: obtaining and reviewing outside history, ordering and reviewing medications, tests or procedures, care coordination (communications with other health care professionals or caregivers) and documentation in  the medical record.   02/25/2022

## 2022-03-13 ENCOUNTER — Inpatient Hospital Stay (HOSPITAL_BASED_OUTPATIENT_CLINIC_OR_DEPARTMENT_OTHER): Payer: 59 | Admitting: Hematology

## 2022-03-13 DIAGNOSIS — Z86711 Personal history of pulmonary embolism: Secondary | ICD-10-CM | POA: Diagnosis not present

## 2022-03-13 DIAGNOSIS — I2699 Other pulmonary embolism without acute cor pulmonale: Secondary | ICD-10-CM

## 2022-03-13 NOTE — Progress Notes (Signed)
Marland Kitchen   HEMATOLOGY/ONCOLOGY CLINIC NOTE Phone visit   Date of Service: 03/13/2022  Patient Care Team: [name], MD as PCP - General (Family Medicine)  CHIEF COMPLAINTS/PURPOSE OF CONSULTATION:  Extensive bilateral PE in February 2021 with right heart strain.  HISTORY OF PRESENTING ILLNESS:   Shelly Shields is a wonderful 41 y.o. female who has been referred to [place] by Dr .Gates Rigg MD \ for evaluation of previous history of bilateral extensive pulmonary embolism. Patient has a history of asthma, diabetes type 2, hypertension, migraine headaches, morbid obesity .There is no height or weight on file to calculate BMI.,  Sleep apnea. On CPAP.  Patient was admitted to the hospital with severe COVID-19 infection in February 2021 when she went in with shortness of breath and fevers.  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  She follows with Dr. Virgina Jock for her nonischemic cardiomyopathy   Pulmonary emboli at that time in February 2021 were thought to be due to severe COVID-19 infection as well as her morbid obesity, relative immobility and use of Depo-Provera shots. She was recommended to get off the Depo-Provera shots but has not done this yet with her OB/GYN doctor.  Currently patient is also following with Dr. Virgina Jock for management of her nonischemic cardiomyopathy.  She continues to be on anticoagulation/Eliquis by her cardiologist..  Patient denies any family history of blood clotting disorder.   Patient currently notes no acute chest pain or shortness of breath.  No leg pain or swelling.  INTERVAL HISTORY: I connected with Shelly Shields on 03/13/2022 at 12:00 PM EDT by telephone visit and verified that I am speaking with the correct person using two identifiers.    Patient was last seen by me on 02/25/2022 and was doing well overall.   Patient notes she is doing well overall without any new symptoms during today's phone visit.   She reports that her cardiologist did not see any Afib.   She reports mild insomnia. She had a sleep study beginning of this year. She has Cpap, but she has not been using it that often.  MEDICAL HISTORY:  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 hauler    Diabetes mellitus without complication ([place])    Hypertension    Migraines    Obesity    Pulmonary embolism ([place]) 05/2019   with COVID   Sleep apnea     SURGICAL HISTORY: Past Surgical History:  Procedure Laterality Date   BILATERAL CARPAL TUNNEL RELEASE     LEFT HEART CATH AND CORONARY ANGIOGRAPHY N/A 03/27/2021   Procedure: LEFT HEART CATH AND CORONARY ANGIOGRAPHY;  Surgeon: Nigel Mormon, MD;  Location: [place] [facility];  Service: Cardiovascular;  Laterality: N/A;   TEE WITHOUT CARDIOVERSION N/A 03/28/2020   Procedure: TRANSESOPHAGEAL ECHOCARDIOGRAM (TEE);  Surgeon: Adrian Prows, MD;  Location: [facility];  Service: Cardiovascular;  Laterality: N/A;    SOCIAL HISTORY: Social History   Socioeconomic History   Marital status: Single    Spouse name: Not on file   Number of children: 0   Years of education: Not on file   Highest education level: Not on file  Occupational History   Occupation: pharmacy tech  Tobacco Use   Smoking status: Never   Smokeless tobacco: Never  Vaping Use   Vaping Use: Never used  Substance and Sexual Activity   Alcohol use: Yes    Alcohol/week: 0.0 standard drinks of alcohol    Comment: occasional    Drug use: No   Sexual activity: Not on file  Other Topics Concern   Not on file  Social History Narrative   Lives in [place]: School and working   Social Determinants of Radio broadcast assistant Strain: Not on file  Food Insecurity: Not on file  Transportation Needs:  Not on file  Physical Activity: Not on file  Stress: Not on file  Social Connections: Not on file  Intimate Partner Violence: Not on file    FAMILY HISTORY: Family History  Problem Relation Age of Onset   Hypertension Mother    Thyroid disease Mother    Hypertension Father    Stroke Father 98   Diabetes Father    Sleep apnea Father    Cancer - Lung Maternal Grandmother    Heart failure Paternal Grandfather    Aneurysm Paternal Aunt 68   CVA Paternal Uncle 72   Colon cancer Maternal Uncle 60    ALLERGIES:  is allergic to lisinopril, metformin, farxiga [dapagliflozin], ibuprofen, and ketorolac.  MEDICATIONS:  Current Outpatient Medications  Medication Sig Dispense Refill   apixaban (ELIQUIS) 5 MG TABS tablet Take 1 tablet (5 mg total) by mouth 2 (two) times daily. Resume on 11/2 morning 60 tablet 0   Baclofen 5 MG TABS Take 5-10 mg by mouth daily as needed (Migraines).     Blood Glucose Monitoring Suppl (ACCU-CHEK NANO SMARTVIEW) W/DEVICE KIT 1 Device by Does not apply route 4 (four) times daily - after meals and at bedtime. 1 kit 0   butalbital-acetaminophen-caffeine (FIORICET) 50-325-40 MG tablet Take 1 tablet by mouth as needed.     carvedilol (COREG) 25 MG tablet TAKE 2 TABLETS(50 MG) BY MOUTH TWICE DAILY WITH A MEAL 180 tablet 3   Continuous Blood Gluc Sensor (DEXCOM G6 SENSOR) MISC SMARTSIG:1 Topical Every 10 Days     Continuous Blood Gluc Transmit (DEXCOM G6 TRANSMITTER) MISC See admin instructions.     Dulaglutide 1.5 MG/0.5ML SOPN Inject 1.5 mg into the skin once a week.     escitalopram (LEXAPRO) 10 MG tablet Take 10 mg by mouth daily.     fexofenadine (ALLEGRA) 180 MG tablet Take 180 mg by mouth daily.     FLOVENT HFA 110 MCG/ACT inhaler Inhale 2 puffs into the lungs 2 (two) times daily.     fluticasone (FLONASE) 50 MCG/ACT nasal spray Place 2 sprays into both nostrils daily as needed for allergies.   3   furosemide (LASIX) 40 MG tablet Take 1 tablet (40 mg total) by  mouth daily. 90 tablet 3   glucose blood (ACCU-CHEK SMARTVIEW) test strip Check sugar 6 x daily (Patient taking differently: Check sugar 6 x daily. Uses true test instead) 200 each 3   HUMALOG KWIKPEN 200 UNIT/ML KwikPen SMARTSIG:10-50 Unit(s) SUB-Q     Insulin Disposable Pump (OMNIPOD DASH PODS, GEN 4,) MISC 200 Units every other day. Humalog     Insulin Infusion Pump (DEXCOM G6 BASAL-IQ INSULIN PMP) DEVI by Does not apply route. (Patient not taking: Reported on 02/25/2022)     Insulin Pen Needle (INSUPEN PEN NEEDLES) 32G X 4 MM MISC BD Pen Needles- brand specific. Inject insulin via insulin pen daily 200 each 3   ivabradine (CORLANOR) 5 MG TABS tablet Take 1 tablet (5 mg  total) by mouth 2 (two) times daily with a meal. 180 tablet 3   Lancet Devices (ACCU-CHEK SOFTCLIX) lancets Use as instructed for blood glucose checks four times daily, before meals and at bedtime 100 each 5   medroxyPROGESTERone (DEPO-PROVERA) 150 MG/ML injection Inject 150 mg into the muscle every 3 (three) months.     montelukast (SINGULAIR) 10 MG tablet Take 10 mg by mouth at bedtime.     Multiple Vitamin (MULTI-VITAMIN) tablet Take 1 tablet by mouth daily.     nortriptyline (PAMELOR) 10 MG capsule Take 20 mg by mouth at bedtime.     NURTEC 75 MG TBDP Take 1 tablet by mouth daily.     ondansetron (ZOFRAN-ODT) 4 MG disintegrating tablet Take 1 tablet (4 mg total) by mouth every 8 (eight) hours as needed for nausea or vomiting. 20 tablet 0   pantoprazole (PROTONIX) 40 MG tablet Take 40 mg by mouth daily.     potassium chloride (KLOR-CON M) 10 MEQ tablet Take 1 tablet by mouth as needed. (Patient not taking: Reported on 02/25/2022)     rosuvastatin (CRESTOR) 20 MG tablet Take 1 tablet (20 mg total) by mouth daily at 6 PM. 30 tablet 0   sacubitril-valsartan (ENTRESTO) 97-103 MG Take 0.5 tablets by mouth 2 (two) times daily. 60 tablet 3   spironolactone (ALDACTONE) 25 MG tablet Take 0.5 tablets (12.5 mg total) by mouth daily. 45  tablet 3   topiramate (TOPAMAX) 100 MG tablet Take 100 mg by mouth 2 (two) times daily.     TRUEPLUS LANCETS 33G MISC USE TO CHECK BLOOD GLUCOSE QID - AFTER MEALS AND AT BEDTIME  3   VENTOLIN HFA 108 (90 BASE) MCG/ACT inhaler Use as directed 2 puffs in the mouth or throat 4 (four) times daily as needed for shortness of breath.   3   No current facility-administered medications for this visit.    REVIEW OF SYSTEMS:   10 Point review of Systems was done is negative except as noted above.  PHYSICAL EXAMINATION: Telemedicine visit  LABORATORY DATA:  I have reviewed the data as listed  .    Latest Ref Rng & Units 02/25/2022    3:44 PM 12/11/2021    5:12 PM 09/09/2021    1:47 PM  CBC  WBC 4.0 - 10.5 K/uL 10.1  8.6  9.5   Hemoglobin 12.0 - 15.0 g/dL 12.6  13.2  13.2   Hematocrit 36.0 - 46.0 % 38.7  41.2  40.3   Platelets 150 - 400 K/uL 343  295  264     .    Latest Ref Rng & Units 02/25/2022    3:44 PM 12/14/2021    9:42 AM 12/11/2021    5:12 PM  CMP  Glucose 70 - 99 mg/dL 115  199  152   BUN 6 - 20 mg/dL _0 Creatinine 0.44 - 1.00 mg/dL 0.97  1.04  1.35   Sodium 135 - 145 mmol/L 143  139  138   Potassium 3.5 - 5.1 mmol/L 3.8  4.2  3.9   Chloride 98 - 111 mmol/L 110  107  109   CO2 22 - 32 mmol/L _1 Calcium 8.9 - 10.3 mg/dL 9.0  9.2  8.8   Total Protein 6.5 - 8.1 g/dL 6.6     Total Bilirubin 0.3 - 1.2 mg/dL 0.5     Alkaline Phos 38 - 126 U/L 78  AST 15 - 41 U/L 15     ALT 0 - 44 U/L 21      Component Ref Range & Units 2 wk ago (02/25/22) 1 yr ago (03/29/20) 1 yr ago (03/28/20)  Anticardiolipin IgG 0 - 14 GPL U/mL <9  <9 CM  9 CM   Comment: (NOTE)                           Negative:              <15                           Indeterminate:     15 - 20                           Low-Med Positive: >20 - 80                           High Positive:         >80   Anticardiolipin IgM 0 - 12 MPL U/mL <9  13 High  CM  12 CM   Comment: (NOTE)                            Negative:              <13                           Indeterminate:     13 - 20                           Low-Med Positive: >20 - 80                           High Positive:         >80   Anticardiolipin IgA 0 - 11 APL U/mL <9  <9 CM  <9 CM   Comment: (NOTE)                           Negative:              <12                           Indeterminate:     12 - 20                           Low-Med Positive: >20 - 80                           High Positive:         >80  Performed At: [facility]  [address] [place], [name] [id number]  Rush Farmer MD [id number]   Resulting Agency  [facility]   Component Ref Range & Units 2 wk ago (02/25/22) 1 yr ago (03/29/20) 1 yr ago (03/28/20)  Protein C Activity 73 - 180 % 87  88 CM  89 CM   Comment: (NOTE)  Performed At: [facility]  [address] [place], [name] [id number]  Rush Farmer MD [id number]   Resulting Agency  [facility]     Component Ref Range & Units 2 wk ago (02/25/22) 1 yr ago (03/29/20) 1 yr ago (03/28/20)  Protein C, Total 60 - 150 % 83  80 CM  93 CM    Component Ref Range & Units 2 wk ago (02/25/22) 1 yr ago (03/29/20) 1 yr ago (03/28/20)  Protein S Activity 63 - 140 % 108  77 CM  48 CM   Comment: (NOTE)  Protein S activity may be falsely increased (masking an abnormal, low  result) in patients receiving direct Xa inhibitor (e.g.,  rivaroxaban, apixaban, edoxaban) or a direct thrombin inhibitor  (e.g., dabigatran) anticoagulant treatment due to assay interference  by these drugs.    Component Ref Range & Units 2 wk ago (02/25/22) 1 yr ago (03/29/20) 1 yr ago (03/28/20)  Protein S Ag, Total 60 - 150 % 121  114 CM  121 CM   Comment: (NOTE)  This test was developed and its performance characteristics  determined by [facility]. It has not been cleared or approved  by the Food and Drug Administration.    Component Ref Range & Units 2 wk  ago (02/25/22) 1 yr ago (03/29/20) 1 yr ago (03/28/20)  PTT Lupus Anticoagulant 0.0 - 43.5 sec 30.3  27.4 R  27.8 R   DRVVT 0.0 - 47.0 sec 46.0  43.7  48.6 High    Lupus Anticoag Interp  Comment: VC  Comment: VC, CM  Comment: VC, CM   Comment: (NOTE)  No lupus anticoagulant was detected.    Component Ref Range & Units 2 wk ago (02/25/22) 1 yr ago (03/29/20) 1 yr ago (03/28/20)  Beta-2 Glyco I IgG 0 - 20 GPI IgG units <9  <9 CM  <9 CM   Comment: (NOTE)  The reference interval reflects a 3SD or 99th percentile interval,  which is thought to represent a potentially clinically significant  result in accordance with the International Consensus Statement on  the classification criteria for definitive antiphospholipid  syndrome (APS). J Thromb Haem 2006;4:295-306.   Beta-2-Glycoprotein I IgM 0 - 32 GPI IgM units <9  <9 CM  <9 CM   Comment: (NOTE)  The reference interval reflects a 3SD or 99th percentile interval,  which is thought to represent a potentially clinically significant  result in accordance with the International Consensus Statement on  the classification criteria for definitive antiphospholipid  syndrome (APS). J Thromb Haem 2006;4:295-306.  Performed At: [facility]  [place], [name] [id number]  Rush Farmer MD [id number]   Beta-2-Glycoprotein I IgA 0 - 25 GPI IgA units <9  <9 CM  <9 CM   Comment: (NOTE)  The reference interval reflects a 3SD or 99th percentile interval,  which is thought to represent a potentially clinically significant  result in accordance with the International Consensus Statement on  the classification criteria for definitive antiphospholipid  syndrome (APS). J Thromb Haem 2006;4:295-306.    Component Ref Range & Units 2 wk ago (02/25/22) 1 yr ago (03/29/20) 1 yr ago (03/28/20)  Homocysteine 0.0 - 14.5 umol/L 7.1  4.8 CM  4.2 CM     RADIOGRAPHIC STUDIES: I have personally reviewed the radiological images as listed and  agreed with the findings in the report. No results found.  ASSESSMENT & PLAN:   41 year old female with  no family history of VTE with  #1 Extensive bilateral pulmonary emboli in February 2021 This was likely due to severe COVID-19 infection causing endothelial damage and inflammation. Other contributing factors were her morbid obesity .There is no height or weight on file to calculate BMI.,  Relative sedentary lifestyle and use of Depo-Provera.  #2 history of embolic CVAs and renal infarct.  Likely cardioembolic source.  Patient is already on Eliquis for this by her primary care physician.  Plan: -Discussed recent lab results with the patient. Her blood counts and blood chemistry are normal. No blood clots and no abnormalities.  -At this point the patient is not having any symptoms suggestive of DVT or PE at this time. -Recommended patient stay active and try to achieve ideal body weight. -no evidence of primary hypercoag condition based on testing. -She will continue to follow with cardiology for continued Eliquis in the context of cardial embolic CVAs and nonischemic cardiomyopathy undergoing additional work-up. -At this point the patient does not appear to have an concern for an inherited thrombophilia. -Recommended to stay well-hydrated and use compression socks. -Continue to follow with cardiology to optimize her management of nonischemic cardiomyopathy .  No orders of the defined types were placed in this encounter.   Follow-up: RTC with Dr Irene Limbo as needed  The total time spent in the appointment was 20 minutes* .  All of the patient's questions were answered with apparent satisfaction. The patient knows to call the clinic with any problems, questions or concerns.   Zettie Cooley, am acting as a scribe for Sullivan Lone, MD.  Sullivan Lone MD [place] [facility] Hematology/Oncology Physician [facility]  .*Total Encounter Time as defined by the Centers for  Medicare and Medicaid Services includes, in addition to the face-to-face time of a patient visit (documented in the note above) non-face-to-face time: obtaining and reviewing outside history, ordering and reviewing medications, tests or procedures, care coordination (communications with other health care professionals or caregivers) and documentation in the medical record.

## 2022-03-29 ENCOUNTER — Ambulatory Visit: Payer: 59 | Admitting: Cardiology

## 2022-03-29 ENCOUNTER — Encounter: Payer: Self-pay | Admitting: Cardiology

## 2022-03-29 ENCOUNTER — Ambulatory Visit: Payer: 59 | Admitting: Student

## 2022-03-29 VITALS — BP 118/61 | HR 71 | Resp 16 | Ht 61.0 in | Wt 248.0 lb

## 2022-03-29 DIAGNOSIS — I5022 Chronic systolic (congestive) heart failure: Secondary | ICD-10-CM

## 2022-03-29 DIAGNOSIS — I428 Other cardiomyopathies: Secondary | ICD-10-CM

## 2022-03-29 NOTE — Progress Notes (Signed)
Follow up visit  Subjective:   Shelly Shields, female    DOB: Mar 19, 1981, 41 y.o.   MRN: [id number]  HPI  Chief Complaint  Patient presents with   Congestive Heart Failure   Follow-up    6 month    41 y/o [place] American female with hypertension, hyperlipidemia, type 2 DM, obesity, OSA on CPAP, h/o PE in the setting of COVID (2021), nonishcemic cardiomyopathy, embolic stroke (50/5397)  Patient was referred to Dr. Caryl Comes for consideration for CRT. Cardiac MRI was ordered to determine EF to decide CRT-D vs CRT-P. MRI is scheduled for 04/2022. F/u w/Dr. Caryl Comes is on 11/38. Patient hopes to undergo CRT before end of the year for insurance reasons.   Patient has class II exertional dyspnea symptoms, occasional leg edema. Unfortunately, she eats chips often,   Current Outpatient Medications:    apixaban (ELIQUIS) 5 MG TABS tablet, Take 1 tablet (5 mg total) by mouth 2 (two) times daily. Resume on 11/2 morning, Disp: 60 tablet, Rfl: 0   Baclofen 5 MG TABS, Take 5-10 mg by mouth daily as needed (Migraines)., Disp: , Rfl:    Blood Glucose Monitoring Suppl (ACCU-CHEK NANO SMARTVIEW) W/DEVICE KIT, 1 Device by Does not apply route 4 (four) times daily - after meals and at bedtime., Disp: 1 kit, Rfl: 0   butalbital-acetaminophen-caffeine (FIORICET) 50-325-40 MG tablet, Take 1 tablet by mouth as needed., Disp: , Rfl:    carvedilol (COREG) 25 MG tablet, TAKE 2 TABLETS(50 MG) BY MOUTH TWICE DAILY WITH A MEAL, Disp: 180 tablet, Rfl: 3   Continuous Blood Gluc Sensor (DEXCOM G6 SENSOR) MISC, SMARTSIG:1 Topical Every 10 Days, Disp: , Rfl:    Continuous Blood Gluc Transmit (DEXCOM G6 TRANSMITTER) MISC, See admin instructions., Disp: , Rfl:    Dulaglutide 1.5 MG/0.5ML SOPN, Inject 1.5 mg into the skin once a week., Disp: , Rfl:    escitalopram (LEXAPRO) 10 MG tablet, Take 10 mg by mouth daily., Disp: , Rfl:    fexofenadine (ALLEGRA) 180 MG tablet, Take 180 mg by mouth daily., Disp: , Rfl:    FLOVENT  HFA 110 MCG/ACT inhaler, Inhale 2 puffs into the lungs 2 (two) times daily., Disp: , Rfl:    fluticasone (FLONASE) 50 MCG/ACT nasal spray, Place 2 sprays into both nostrils daily as needed for allergies. , Disp: , Rfl: 3   furosemide (LASIX) 40 MG tablet, Take 1 tablet (40 mg total) by mouth daily., Disp: 90 tablet, Rfl: 3   gabapentin (NEURONTIN) 300 MG capsule, Take 300 mg by mouth 3 (three) times daily., Disp: , Rfl:    glucose blood (ACCU-CHEK SMARTVIEW) test strip, Check sugar 6 x daily (Patient taking differently: Check sugar 6 x daily. Uses true test instead), Disp: 200 each, Rfl: 3   HUMALOG KWIKPEN 200 UNIT/ML KwikPen, SMARTSIG:10-50 Unit(s) SUB-Q, Disp: , Rfl:    Insulin Disposable Pump (OMNIPOD DASH PODS, GEN 4,) MISC, 200 Units every other day. Humalog, Disp: , Rfl:    Insulin Infusion Pump (DEXCOM G6 BASAL-IQ INSULIN PMP) DEVI, by Does not apply route., Disp: , Rfl:    Insulin Pen Needle (INSUPEN PEN NEEDLES) 32G X 4 MM MISC, BD Pen Needles- brand specific. Inject insulin via insulin pen daily, Disp: 200 each, Rfl: 3   ivabradine (CORLANOR) 5 MG TABS tablet, Take 1 tablet (5 mg total) by mouth 2 (two) times daily with a meal., Disp: 180 tablet, Rfl: 3   Lancet Devices (ACCU-CHEK [place]) lancets, Use as instructed for blood glucose checks  four times daily, before meals and at bedtime, Disp: 100 each, Rfl: 5   medroxyPROGESTERone (DEPO-PROVERA) 150 MG/ML injection, Inject 150 mg into the muscle every 3 (three) months., Disp: , Rfl:    montelukast (SINGULAIR) 10 MG tablet, Take 10 mg by mouth at bedtime., Disp: , Rfl:    Multiple Vitamin (MULTI-VITAMIN) tablet, Take 1 tablet by mouth daily., Disp: , Rfl:    nortriptyline (PAMELOR) 10 MG capsule, Take 20 mg by mouth at bedtime., Disp: , Rfl:    NURTEC 75 MG TBDP, Take 1 tablet by mouth daily., Disp: , Rfl:    ondansetron (ZOFRAN-ODT) 4 MG disintegrating tablet, Take 1 tablet (4 mg total) by mouth every 8 (eight) hours as needed for nausea  or vomiting., Disp: 20 tablet, Rfl: 0   pantoprazole (PROTONIX) 40 MG tablet, Take 40 mg by mouth daily., Disp: , Rfl:    rosuvastatin (CRESTOR) 20 MG tablet, Take 1 tablet (20 mg total) by mouth daily at 6 PM., Disp: 30 tablet, Rfl: 0   sacubitril-valsartan (ENTRESTO) 97-103 MG, Take 0.5 tablets by mouth 2 (two) times daily., Disp: 60 tablet, Rfl: 3   spironolactone (ALDACTONE) 25 MG tablet, Take 0.5 tablets (12.5 mg total) by mouth daily., Disp: 45 tablet, Rfl: 3   topiramate (TOPAMAX) 100 MG tablet, Take 100 mg by mouth 2 (two) times daily., Disp: , Rfl:    TRUEPLUS LANCETS 33G MISC, USE TO CHECK BLOOD GLUCOSE QID - AFTER MEALS AND AT BEDTIME, Disp: , Rfl: 3   VENTOLIN HFA 108 (90 BASE) MCG/ACT inhaler, Use as directed 2 puffs in the mouth or throat 4 (four) times daily as needed for shortness of breath. , Disp: , Rfl: 3   Cardiovascular & other pertient studies:  Reviewed external labs and tests, independently interpreted  EKG 03/29/2022: Sinus rhythm 72 bpm  Left atrial enlargement Nonspecific diffuse T wave inversion Compared to previous EKG on 09/20/2021, IVCD now absent. QRS is normal.  Echocardiogram 04/12/2021:  Left ventricle cavity is normal in size. Normal left ventricular wall  thickness. Severe global hypokinesis. LVEF 25-30%. Indeterminate diastolic  filling pattern.  No significant valvular abnormality.  Normal right atrial pressure.  No significant change compared to previous study on 07/25/2020.   Coronary angiography 03/2021: LM: Normal LAD: Normal         Prox diag 1 30% disease Lcx: Normal RCA: Normal   Mildly decompensated nonischemic cardiomyopathy Continue GDMT for HFrEF  LONG TERM MONITOR (8-14 DAYS) 06/22/20-07/06/20:  Predominant underlying rhythm was sinus with minimum heart rate of 71 bpm, maximum heart rate of 193 bpm average heart rate of 91 bpm.  There were no patient triggered events.  PACs and PVCs were rare.  Patient with intermittent right bundle  branch block.  2 episodes of supraventricular tachycardia with the longest lasting 12 seconds and a maximum heart rate of 93 bpm.  No evidence of atrial fibrillation.  No ventricular tachycardia, high degree AV block, pauses >3 seconds.   TEE 03/2020: Indication: Patient presenting with embolic CVA, TEE being performed to evaluate for cardiac source of cerebral emboli.  She was also found to have severe LV systolic dysfunction by echocardiogram.   LV: Dilated, global hypokinesis, EF 20%.  No mural thrombus. RV: Normal. Left atrium: Mildly dilated.  There is a small PFO with right to left shunting, weakly positive shunting. Left atrial appendage: Normal.  No thrombus.  Normal function. Right atrium: Normal. Mitral valve: Normal.  Trace MR. Tricuspid valve: Normal.  Trace TR.  Pulmonary valve: Normal. Aortic valve: Normal.  No aortic regurgitation. Aorta: Normal.  No dissection.   Impression: No obvious cause for cardiac source of cerebral emboli, weakly positive left-to-right shunting by contrast study and also severe LV systolic dysfunction without mural thrombus.  Left atrial appendage is normal and thoracic aorta is normal.    Recent labs: 02/25/2022: Glucose 115, BUN/Cr 14/0.97. EGFR >60. Na/K 143/3.8. Rest of the CMP normal H/H 12/38. MCV 89. Platelets 343  03/2020: Chol 176, TG 106, HDL 29, LDL 126   Latest Reference Range & Units 09/20/21 09:59 12/11/21 17:12 12/11/21 17:30 12/11/21 19:23 12/14/21 09:42  B Natriuretic Peptide 0.0 - 100.0 pg/mL   132.0 (H)    NT-Pro BNP 0 - 130 pg/mL 114    543 (H)  Troponin I (High Sensitivity) <18 ng/L  100 (HH)  88 (H)   (HH): Data is critically high (H): Data is abnormally high    Review of Systems  Cardiovascular:  Positive for dyspnea on exertion and leg swelling. Negative for chest pain, palpitations and syncope.         Vitals:   03/29/22 1348  BP: 118/61  Pulse: 71  Resp: 16  SpO2: 97%    Body mass index is 46.86  kg/m. Filed Weights   03/29/22 1348  Weight: 248 lb (112.5 kg)     Objective:   Physical Exam Vitals and nursing note reviewed.  Constitutional:      General: She is not in acute distress. Neck:     Vascular: No JVD.  Cardiovascular:     Rate and Rhythm: Normal rate and regular rhythm.     Heart sounds: Normal heart sounds. No murmur heard. Pulmonary:     Effort: Pulmonary effort is normal.     Breath sounds: Normal breath sounds. No wheezing or rales.  Musculoskeletal:     Right lower leg: Edema (Trace) present.     Left lower leg: Edema (Trace) present.             Visit diagnoses:   ICD-10-CM   1. Chronic systolic heart failure (HCC)  I50.22 EKG 12-Lead    Amb Referral to Cardiac Rehabilitation    2. Nonischemic cardiomyopathy (HCC)  I42.8 Amb Referral to Cardiac Rehabilitation        Assessment & Recommendations:   41 y/o [place] American female with hypertension, hyperlipidemia, type 2 DM, obesity, OSA on CPAP, h/o PE in the setting of COVID (2021), nonishcemic cardiomyopathy, embolic stroke (67/0141)  HFrEF: Nonischemic cardiomyopathy. On GDMT for HFrEF-coreg-tolerating at 50 mg bid, Entresto 97-103 mg bid, spironolactone 25 mg daily, corlanor 7.5 mg bid, lasix 40 mg daily. Did not tolerate SGLT2i due to yeast infection. Discussed salt restriction. Cardiac MRI 04/2022, then decide on CRT-D vs CRT-P. That said, her EKG today shows normal QRS, which is a change from prior. I suspect she may not need CRT anymore. It is possible that EF could also have improved. Will await MRI. Will refer her to cardiac rehab.  H/o PE: Continue eliquis   F/u in 6 months       Nigel Mormon, MD Pager: [phone] Office: [phone]

## 2022-03-30 ENCOUNTER — Other Ambulatory Visit: Payer: Self-pay

## 2022-03-30 ENCOUNTER — Encounter (HOSPITAL_COMMUNITY): Payer: Self-pay

## 2022-03-30 ENCOUNTER — Emergency Department (HOSPITAL_COMMUNITY)
Admission: EM | Admit: 2022-03-30 | Discharge: 2022-03-30 | Disposition: A | Discharge status: Home / Self Care | Payer: 59 | Attending: Emergency Medicine | Admitting: Emergency Medicine

## 2022-03-30 DIAGNOSIS — Z79899 Other long term (current) drug therapy: Secondary | ICD-10-CM | POA: Diagnosis not present

## 2022-03-30 DIAGNOSIS — E119 Type 2 diabetes mellitus without complications: Secondary | ICD-10-CM | POA: Insufficient documentation

## 2022-03-30 DIAGNOSIS — M25552 Pain in left hip: Secondary | ICD-10-CM | POA: Insufficient documentation

## 2022-03-30 DIAGNOSIS — Z794 Long term (current) use of insulin: Secondary | ICD-10-CM | POA: Diagnosis not present

## 2022-03-30 DIAGNOSIS — Z7901 Long term (current) use of anticoagulants: Secondary | ICD-10-CM | POA: Insufficient documentation

## 2022-03-30 DIAGNOSIS — M5442 Lumbago with sciatica, left side: Secondary | ICD-10-CM | POA: Diagnosis not present

## 2022-03-30 DIAGNOSIS — I10 Essential (primary) hypertension: Secondary | ICD-10-CM | POA: Insufficient documentation

## 2022-03-30 MED ORDER — METHOCARBAMOL 500 MG PO TABS: 500.0000 mg | ORAL_TABLET | Freq: Two times a day (BID) | ORAL | 0 refills | Status: DC

## 2022-03-30 NOTE — ED Provider Notes (Signed)
[facility] Provider Note   CSN: [id number] Arrival date & time: 03/30/22  1110     History  Chief Complaint  Patient presents with   Hip Pain    Shelly Shields is a 41 y.o. female with past medical history significant for diabetes, hypertension, obesity who presents with concern for left hip pain for a few days but worse over the last night.  Patient reports that she has had some chronic problems in this hip.  She does endorse some pain radiating down her hip, groin, and pain in the left lower back.  She denies any urinary or fecal incontinence, fever, chills, chronic corticosteroid use, IV drug use, previous cancer.  She reports that she cannot take ibuprofen because she is allergic, so she is only taking Tylenol at home.   Hip Pain       Home Medications Prior to Admission medications   Medication Sig Start Date End Date Taking? Authorizing Provider  methocarbamol (ROBAXIN) 500 MG tablet Take 1 tablet (500 mg total) by mouth 2 (two) times daily. 03/30/22  Yes ,  H, PA-C  apixaban (ELIQUIS) 5 MG TABS tablet Take 1 tablet (5 mg total) by mouth 2 (two) times daily. Resume on 11/2 morning 03/27/21   Patwardhan, Reynold Bowen, MD  Baclofen 5 MG TABS Take 5-10 mg by mouth daily as needed (Migraines). 08/09/19   [provider]  Blood Glucose Monitoring Suppl (ACCU-CHEK NANO SMARTVIEW) W/DEVICE KIT 1 Device by Does not apply route 4 (four) times daily - after meals and at bedtime. 11/29/14   Frazier Richards, MD  butalbital-acetaminophen-caffeine (FIORICET) [phone] MG tablet Take 1 tablet by mouth as needed. 12/11/21   [provider]  carvedilol (COREG) 25 MG tablet TAKE 2 TABLETS(50 MG) BY MOUTH TWICE DAILY WITH A MEAL 01/14/22   Patwardhan, Manish J, MD  Continuous Blood Gluc Sensor (DEXCOM G6 SENSOR) MISC SMARTSIG:1 Topical Every 10 Days 10/18/20   [provider]  Continuous Blood Gluc Transmit (DEXCOM G6  TRANSMITTER) MISC See admin instructions. 08/16/20   [provider]  Dulaglutide 1.5 MG/0.5ML SOPN Inject 1.5 mg into the skin once a week. 06/05/20   [provider]  escitalopram (LEXAPRO) 10 MG tablet Take 10 mg by mouth daily. 03/01/20   [provider]  fexofenadine (ALLEGRA) 180 MG tablet Take 180 mg by mouth daily.    [provider]  FLOVENT HFA 110 MCG/ACT inhaler Inhale 2 puffs into the lungs 2 (two) times daily. 04/26/21   [provider]  fluticasone (FLONASE) 50 MCG/ACT nasal spray Place 2 sprays into both nostrils daily as needed for allergies.  09/22/14   [provider]  furosemide (LASIX) 40 MG tablet Take 1 tablet (40 mg total) by mouth daily. 12/18/21 12/18/22  Cantwell, Celeste C, PA-C  gabapentin (NEURONTIN) 300 MG capsule Take 300 mg by mouth 3 (three) times daily. 02/22/22   [provider]  glucose blood (ACCU-CHEK SMARTVIEW) test strip Check sugar 6 x daily Patient taking differently: Check sugar 6 x daily. Uses true test instead 11/29/14   Frazier Richards, MD  HUMALOG KWIKPEN 200 UNIT/ML KwikPen SMARTSIG:10-50 Unit(s) SUB-Q 09/06/21   [provider]  Insulin Disposable Pump (OMNIPOD DASH PODS, GEN 4,) MISC 200 Units every other day. Humalog 02/23/21   [provider]  Insulin Infusion Pump (DEXCOM G6 BASAL-IQ INSULIN PMP) DEVI by Does not apply route.    [provider]  Insulin Pen Needle (INSUPEN  PEN NEEDLES) 32G X 4 MM MISC BD Pen Needles- brand specific. Inject insulin via insulin pen daily 11/29/14   Frazier Richards, MD  ivabradine (CORLANOR) 5 MG TABS tablet Take 1 tablet (5 mg total) by mouth 2 (two) times daily with a meal. 08/14/21   Cantwell, [place] C, PA-C  Lancet Devices (ACCU-CHEK [place]) lancets Use as instructed for blood glucose checks four times daily, before meals and at bedtime 11/29/14   Frazier Richards, MD  medroxyPROGESTERone (DEPO-PROVERA) 150 MG/ML injection Inject 150 mg into  the muscle every 3 (three) months.    [provider]  montelukast (SINGULAIR) 10 MG tablet Take 10 mg by mouth at bedtime.    [provider]  Multiple Vitamin (MULTI-VITAMIN) tablet Take 1 tablet by mouth daily.    [provider]  nortriptyline (PAMELOR) 10 MG capsule Take 20 mg by mouth at bedtime.    [provider]  NURTEC 75 MG TBDP Take 1 tablet by mouth daily. 04/02/21   [provider]  ondansetron (ZOFRAN-ODT) 4 MG disintegrating tablet Take 1 tablet (4 mg total) by mouth every 8 (eight) hours as needed for nausea or vomiting. 09/09/21   Deatra Canter, Amjad, PA-C  pantoprazole (PROTONIX) 40 MG tablet Take 40 mg by mouth daily.    [provider]  rosuvastatin (CRESTOR) 20 MG tablet Take 1 tablet (20 mg total) by mouth daily at 6 PM. 03/30/20   Elgergawy, Silver Huguenin, MD  sacubitril-valsartan (ENTRESTO) 97-103 MG Take 0.5 tablets by mouth 2 (two) times daily. 09/20/21   Cantwell, Celeste C, PA-C  spironolactone (ALDACTONE) 25 MG tablet Take 0.5 tablets (12.5 mg total) by mouth daily. 01/29/22   Deboraha Sprang, MD  topiramate (TOPAMAX) 100 MG tablet Take 100 mg by mouth 2 (two) times daily.    [provider]  TRUEPLUS LANCETS 33G MISC USE TO CHECK BLOOD GLUCOSE QID - AFTER MEALS AND AT BEDTIME 12/22/14   [provider]  VENTOLIN HFA 108 (90 BASE) MCG/ACT inhaler Use as directed 2 puffs in the mouth or throat 4 (four) times daily as needed for shortness of breath.  12/21/14   [provider]      Allergies    Lisinopril, Metformin, Farxiga [dapagliflozin], Ibuprofen, and Ketorolac    Review of Systems   Review of Systems  All other systems reviewed and are negative.   Physical Exam Updated Vital Signs BP (!) 146/83   Pulse 65   Temp 99 F (37.2 C)   Resp 20   Ht _0  (1.549 m)   Wt 111.1 kg   SpO2 99%   BMI 46.29 kg/m  Physical Exam Vitals and nursing note reviewed.  Constitutional:      General: She is not  in acute distress.    Appearance: Normal appearance.  HENT:     Head: Normocephalic and atraumatic.  Eyes:     General:        Right eye: No discharge.        Left eye: No discharge.  Cardiovascular:     Rate and Rhythm: Normal rate and regular rhythm.  Pulmonary:     Effort: Pulmonary effort is normal. No respiratory distress.  Musculoskeletal:        General: No deformity.     Comments: Patient with some tenderness in the left lumbar paraspinous region, no midline lumbar tenderness, tenderness extends over the left trochanter.  She has intact strength 5/5 bilateral lower extremities, positive SLR on left  Skin:    General: Skin is warm and dry.  Neurological:     Mental Status: She is alert and oriented to person, place, and time.  Psychiatric:        Mood and Affect: Mood normal.        Behavior: Behavior normal.     ED Results / Procedures / Treatments   Labs (all labs ordered are listed, but only abnormal results are displayed) Labs Reviewed - No data to display  EKG None  Radiology No results found.  Procedures Procedures    Medications Ordered in ED Medications - No data to display  ED Course/ Medical Decision Making/ A&P                           Medical Decision Making  Patient with back pain, left hip pain.  My emergent differential diagnosis includes slipped disc, compression fracture, spondylolisthesis, less clinical concern for epidural abscess or osteomyelitis based on patient history.  No neurological deficits. Patient is ambulatory. No warning symptoms of back pain including: fecal incontinence, urinary retention or overflow incontinence, night sweats, waking from sleep with back pain, unexplained fevers or weight loss, h/o cancer, IVDU, recent trauma. No concern for cauda equina, epidural abscess, or other serious cause of back pain.  Given this work-up, evaluation, physical exam I do not believe that radiographic imaging is indicated at this time.   Conservative measures such as rest, ice/heat, Tylenol, and  prescription for Robaxin indicated with orthopedic follow-up if no improvement with conservative management.  Extensive return precautions given, patient discharged in stable condition at this time.  Final Clinical Impression(s) / ED Diagnoses Final diagnoses:  Left hip pain  Acute left-sided low back pain with left-sided sciatica    Rx / DC Orders ED Discharge Orders          Ordered    methocarbamol (ROBAXIN) 500 MG tablet  2 times daily        03/30/22 1136              , [place] H, PA-C 03/30/22 1153    Kemper Durie, DO 03/30/22 1254

## 2022-03-30 NOTE — ED Triage Notes (Signed)
Pt arrived POV from home c/o left hip for a couple days but states it got worse last night. Pt states she cannot find a comfortable position to make it stop hurting. Pt denies any falls, trauma or injuries.

## 2022-03-30 NOTE — Discharge Instructions (Addendum)
Please use Tylenol for pain.  You may use 1000 mg of Tylenol every 6 hours.  Not to exceed 4 g of Tylenol within 24 hours.  You can use the muscle relaxant I am prescribing in addition to the above to help with any breakthrough pain.  You can take it up to twice daily.  It is safe to take at night, but I would be cautious taking it during the day as it can cause some drowsiness.  Make sure that you are feeling awake and alert before you get behind the wheel of a car or operate a motor vehicle.  It is not a narcotic pain medication so you are able to take it if it is not making you drowsy and still pilot a vehicle or machinery safely.

## 2022-04-01 ENCOUNTER — Other Ambulatory Visit: Payer: Self-pay | Admitting: Cardiology

## 2022-04-23 ENCOUNTER — Encounter: Payer: Self-pay | Admitting: Internal Medicine

## 2022-04-23 ENCOUNTER — Ambulatory Visit: Payer: 59 | Attending: Internal Medicine | Admitting: Internal Medicine

## 2022-04-23 VITALS — BP 124/90 | HR 96 | Ht 61.0 in | Wt 254.2 lb

## 2022-04-23 DIAGNOSIS — I5022 Chronic systolic (congestive) heart failure: Secondary | ICD-10-CM

## 2022-04-23 DIAGNOSIS — I447 Left bundle-branch block, unspecified: Secondary | ICD-10-CM | POA: Diagnosis not present

## 2022-04-23 DIAGNOSIS — I428 Other cardiomyopathies: Secondary | ICD-10-CM | POA: Diagnosis not present

## 2022-04-23 DIAGNOSIS — Z01812 Encounter for preprocedural laboratory examination: Secondary | ICD-10-CM | POA: Diagnosis not present

## 2022-04-23 NOTE — H&P (View-Only) (Signed)
Patient Care Team: [name], MD as PCP - General (Family Medicine)   HPI  Shelly DEENEY is a 41 y.o. female seen following initial consultation 9/23 for consideration of an ICD in the setting of nonischemic cardiomyopathy and class III CHF.  Also had left bundle branch block and so was clearly a candidate for CRT; however, in the absence of recent LVEF assessment, she was referred for cMRI which unfortunately is now scheduled 05/03/2022  History of a prior stroke; history of pulmonary embolism in the context of antecedent COVID is started on apixaban.  With her history of strokes, emboli and family history, she was referred to hematology for a hypercoagulable work-up.  "Does not appear to have a concern for inherited thrombophilia "  Still complaining of DOE < 156f, some edema ; Rx OSA  DATE TEST EF    2/21 Echo  50-55%  %    11/21 Echo  20-25% %    11/21 TEE   Small interatrial shunt  3/22 Echo  20-25%    11/22 LHC   Non obstructive CAD             Date Cr K Hgb  7/23 1.04 4.2 13.2             Records and Results Reviewed   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 hauler    Di[place]    Hypertension    Migraines    Obesity    [place]   with COVID   Sleep apnea     Past Surgical History:  Procedure Laterality Date   BILATERAL CARPAL TUNNEL RELEASE     LEFT HEART CATH AND CORONARY ANGIOGRAPHY N/A 03/27/2021   Procedure: LEFT HEART CATH AND CORONARY ANGIOGRAPHY;  Surgeon: PNigel Mormon MD;  [facility];  Service: Cardiovascular;  Laterality: N/A;   TEE WITHOUT CARDIOVERSION N/A 03/28/2020   Procedure: TRANSESOPHAGEAL ECHOCARDIOGRAM (TEE);  Surgeon: GAdrian Prows MD;  [facility];  Service: Cardiovascular;  Laterality: N/A;    Current Meds  Medication Sig   apixaban (ELIQUIS) 5 MG TABS tablet Take 1 tablet (5 mg total) by mouth 2  (two) times daily. Resume on 11/2 morning   Baclofen 5 MG TABS Take 5-10 mg by mouth daily as needed (Migraines).   Blood Glucose Monitoring Suppl (ACCU-CHEK NANO SMARTVIEW) W/DEVICE KIT 1 Device by Does not apply route 4 (four) times daily - after meals and at bedtime.   butalbital-acetaminophen-caffeine (FIORICET) 50-325-40 MG tablet Take 1 tablet by mouth as needed.   carvedilol (COREG) 25 MG tablet TAKE 2 TABLETS(50 MG) BY MOUTH TWICE DAILY WITH A MEAL   Continuous Blood Gluc Sensor (DEXCOM G6 SENSOR) MISC SMARTSIG:1 Topical Every 10 Days   Continuous Blood Gluc Transmit (DEXCOM G6 TRANSMITTER) MISC See admin instructions.   Dulaglutide 1.5 MG/0.5ML SOPN Inject 1.5 mg into the skin once a week.   escitalopram (LEXAPRO) 10 MG tablet Take 10 mg by mouth daily.   fexofenadine (ALLEGRA) 180 MG tablet Take 180 mg by mouth daily.   FLOVENT HFA 110 MCG/ACT inhaler Inhale 2 puffs into the lungs 2 (two) times daily.   fluticasone (FLONASE) 50 MCG/ACT nasal spray Place 2 sprays into both nostrils daily as needed for allergies.    furosemide (LASIX) 40 MG tablet TAKE 1 TABLET(40 MG) BY MOUTH DAILY AS NEEDED FOR FLUID RETENTION  OR SWELLING   gabapentin (NEURONTIN) 300 MG capsule Take 300 mg by mouth 3 (three) times daily.   glucose blood (ACCU-CHEK SMARTVIEW) test strip Check sugar 6 x daily (Patient taking differently: Check sugar 6 x daily. Uses true test instead)   HUMALOG KWIKPEN 200 UNIT/ML KwikPen SMARTSIG:10-50 Unit(s) SUB-Q   Insulin Disposable Pump (OMNIPOD DASH PODS, GEN 4,) MISC 200 Units every other day. Humalog   Insulin Infusion Pump (DEXCOM G6 BASAL-IQ INSULIN PMP) DEVI by Does not apply route.   Insulin Pen Needle (INSUPEN PEN NEEDLES) 32G X 4 MM MISC BD Pen Needles- brand specific. Inject insulin via insulin pen daily   ivabradine (CORLANOR) 5 MG TABS tablet Take 1 tablet (5 mg total) by mouth 2 (two) times daily with a meal.   Lancet Devices (ACCU-CHEK SOFTCLIX) lancets Use as  instructed for blood glucose checks four times daily, before meals and at bedtime   medroxyPROGESTERone (DEPO-PROVERA) 150 MG/ML injection Inject 150 mg into the muscle every 3 (three) months.   methocarbamol (ROBAXIN) 500 MG tablet Take 1 tablet (500 mg total) by mouth 2 (two) times daily.   montelukast (SINGULAIR) 10 MG tablet Take 10 mg by mouth at bedtime.   Multiple Vitamin (MULTI-VITAMIN) tablet Take 1 tablet by mouth daily.   nortriptyline (PAMELOR) 10 MG capsule Take 20 mg by mouth at bedtime.   NURTEC 75 MG TBDP Take 1 tablet by mouth daily.   ondansetron (ZOFRAN-ODT) 4 MG disintegrating tablet Take 1 tablet (4 mg total) by mouth every 8 (eight) hours as needed for nausea or vomiting.   pantoprazole (PROTONIX) 40 MG tablet Take 40 mg by mouth daily.   rosuvastatin (CRESTOR) 20 MG tablet Take 1 tablet (20 mg total) by mouth daily at 6 PM.   sacubitril-valsartan (ENTRESTO) 97-103 MG Take 0.5 tablets by mouth 2 (two) times daily.   spironolactone (ALDACTONE) 25 MG tablet Take 0.5 tablets (12.5 mg total) by mouth daily.   topiramate (TOPAMAX) 100 MG tablet Take 100 mg by mouth 2 (two) times daily.   TRUEPLUS LANCETS 33G MISC USE TO CHECK BLOOD GLUCOSE QID - AFTER MEALS AND AT BEDTIME   VENTOLIN HFA 108 (90 BASE) MCG/ACT inhaler Use as directed 2 puffs in the mouth or throat 4 (four) times daily as needed for shortness of breath.     Allergies  Allergen Reactions   Lisinopril Cough   Metformin Diarrhea   Farxiga [Dapagliflozin]     Recurrent yeast infections   Ibuprofen Hives   Ketorolac Hives      Review of Systems negative except from HPI and PMH  Physical Exam BP (!) 124/90   Pulse 96   Ht _0  (1.549 m)   Wt 254 lb 3.2 oz (115.3 kg)   LMP  (Approximate)   SpO2 96%   BMI 48.03 kg/m  Well developed and well nourished in no acute distress HENT normal E scleral and icterus clear Neck Supple JVP flat; carotids brisk and full Clear to ausculation Regular rate and  rhythm, no murmurs gallops or rub Soft with active bowel sounds No clubbing cyanosis Trace Edema Alert and oriented, grossly normal motor and sensory function Skin Warm and Dry  ECG sinus at 96   Left bundle branch block  CrCl cannot be calculated (Patient's most recent lab result is older than the maximum 21 days allowed.).   Assessment and  Plan  Nonischemic cardiomyopathy  Left bundle branch block  Congestive heart failure class 3 acute chronic  Hypercoagulable state  chronic Eliquis  Stroke  We await repeat LV function assessment to determine whether she is appropriately considered for HV therapy in conjunction with CRT, Her ongoing CHF makes CRT appropriate to consider for relief of symptoms. Lenghty discussion about this with patient and her mom  Volume overloaded  will increase her fursoemide  from 40 >> 80 daily x 4d then resume prior dose   Continue corlanor, HR remains increased inaddition to carvediolol entresto spiro    Current medicines are reviewed at length with the patient today .  The patient does not  have concerns regarding medicines.

## 2022-04-23 NOTE — Progress Notes (Signed)
Patient Care Team: [name], MD as PCP - General (Family Medicine)   HPI  Shelly Shields is a 41 y.o. female seen following initial consultation 9/23 for consideration of an ICD in the setting of nonischemic cardiomyopathy and class III CHF.  Also had left bundle branch block and so was clearly a candidate for CRT; however, in the absence of recent LVEF assessment, she was referred for cMRI which unfortunately is now scheduled 05/03/2022  History of a prior stroke; history of pulmonary embolism in the context of antecedent COVID is started on apixaban.  With her history of strokes, emboli and family history, she was referred to hematology for a hypercoagulable work-up.  "Does not appear to have a concern for inherited thrombophilia "  Still complaining of DOE < 157f, some edema ; Rx OSA  DATE TEST EF    2/21 Echo  50-55%  %    11/21 Echo  20-25% %    11/21 TEE   Small interatrial shunt  3/22 Echo  20-25%    11/22 LHC   Non obstructive CAD             Date Cr K Hgb  7/23 1.04 4.2 13.2             Records and Results Reviewed   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 hauler    [place]    Hypertension    Migraines    Obesity    [place] [place]   with COVID   Sleep apnea     Past Surgical History:  Procedure Laterality Date   BILATERAL CARPAL TUNNEL RELEASE     LEFT HEART CATH AND CORONARY ANGIOGRAPHY N/A 03/27/2021   Procedure: LEFT HEART CATH AND CORONARY ANGIOGRAPHY;  Surgeon: PNigel Mormon MD;  [facility];  Service: Cardiovascular;  Laterality: N/A;   TEE WITHOUT CARDIOVERSION N/A 03/28/2020   Procedure: TRANSESOPHAGEAL ECHOCARDIOGRAM (TEE);  Surgeon: GAdrian Prows MD;  [facility];  Service: Cardiovascular;  Laterality: N/A;    Current Meds  Medication Sig   apixaban (ELIQUIS) 5 MG TABS tablet Take 1 tablet (5 mg total) by mouth 2  (two) times daily. Resume on 11/2 morning   Baclofen 5 MG TABS Take 5-10 mg by mouth daily as needed (Migraines).   Blood Glucose Monitoring Suppl (ACCU-CHEK NANO SMARTVIEW) W/DEVICE KIT 1 Device by Does not apply route 4 (four) times daily - after meals and at bedtime.   butalbital-acetaminophen-caffeine (FIORICET) 50-325-40 MG tablet Take 1 tablet by mouth as needed.   carvedilol (COREG) 25 MG tablet TAKE 2 TABLETS(50 MG) BY MOUTH TWICE DAILY WITH A MEAL   Continuous Blood Gluc Sensor (DEXCOM G6 SENSOR) MISC SMARTSIG:1 Topical Every 10 Days   Continuous Blood Gluc Transmit (DEXCOM G6 TRANSMITTER) MISC See admin instructions.   Dulaglutide 1.5 MG/0.5ML SOPN Inject 1.5 mg into the skin once a week.   escitalopram (LEXAPRO) 10 MG tablet Take 10 mg by mouth daily.   fexofenadine (ALLEGRA) 180 MG tablet Take 180 mg by mouth daily.   FLOVENT HFA 110 MCG/ACT inhaler Inhale 2 puffs into the lungs 2 (two) times daily.   fluticasone (FLONASE) 50 MCG/ACT nasal spray Place 2 sprays into both nostrils daily as needed for allergies.    furosemide (LASIX) 40 MG tablet TAKE 1 TABLET(40 MG) BY MOUTH DAILY AS NEEDED FOR FLUID RETENTION  OR SWELLING   gabapentin (NEURONTIN) 300 MG capsule Take 300 mg by mouth 3 (three) times daily.   glucose blood (ACCU-CHEK SMARTVIEW) test strip Check sugar 6 x daily (Patient taking differently: Check sugar 6 x daily. Uses true test instead)   HUMALOG KWIKPEN 200 UNIT/ML KwikPen SMARTSIG:10-50 Unit(s) SUB-Q   Insulin Disposable Pump (OMNIPOD DASH PODS, GEN 4,) MISC 200 Units every other day. Humalog   Insulin Infusion Pump (DEXCOM G6 BASAL-IQ INSULIN PMP) DEVI by Does not apply route.   Insulin Pen Needle (INSUPEN PEN NEEDLES) 32G X 4 MM MISC BD Pen Needles- brand specific. Inject insulin via insulin pen daily   ivabradine (CORLANOR) 5 MG TABS tablet Take 1 tablet (5 mg total) by mouth 2 (two) times daily with a meal.   Lancet Devices (ACCU-CHEK SOFTCLIX) lancets Use as  instructed for blood glucose checks four times daily, before meals and at bedtime   medroxyPROGESTERone (DEPO-PROVERA) 150 MG/ML injection Inject 150 mg into the muscle every 3 (three) months.   methocarbamol (ROBAXIN) 500 MG tablet Take 1 tablet (500 mg total) by mouth 2 (two) times daily.   montelukast (SINGULAIR) 10 MG tablet Take 10 mg by mouth at bedtime.   Multiple Vitamin (MULTI-VITAMIN) tablet Take 1 tablet by mouth daily.   nortriptyline (PAMELOR) 10 MG capsule Take 20 mg by mouth at bedtime.   NURTEC 75 MG TBDP Take 1 tablet by mouth daily.   ondansetron (ZOFRAN-ODT) 4 MG disintegrating tablet Take 1 tablet (4 mg total) by mouth every 8 (eight) hours as needed for nausea or vomiting.   pantoprazole (PROTONIX) 40 MG tablet Take 40 mg by mouth daily.   rosuvastatin (CRESTOR) 20 MG tablet Take 1 tablet (20 mg total) by mouth daily at 6 PM.   sacubitril-valsartan (ENTRESTO) 97-103 MG Take 0.5 tablets by mouth 2 (two) times daily.   spironolactone (ALDACTONE) 25 MG tablet Take 0.5 tablets (12.5 mg total) by mouth daily.   topiramate (TOPAMAX) 100 MG tablet Take 100 mg by mouth 2 (two) times daily.   TRUEPLUS LANCETS 33G MISC USE TO CHECK BLOOD GLUCOSE QID - AFTER MEALS AND AT BEDTIME   VENTOLIN HFA 108 (90 BASE) MCG/ACT inhaler Use as directed 2 puffs in the mouth or throat 4 (four) times daily as needed for shortness of breath.     Allergies  Allergen Reactions   Lisinopril Cough   Metformin Diarrhea   Farxiga [Dapagliflozin]     Recurrent yeast infections   Ibuprofen Hives   Ketorolac Hives      Review of Systems negative except from HPI and PMH  Physical Exam BP (!) 124/90   Pulse 96   Ht _0  (1.549 m)   Wt 254 lb 3.2 oz (115.3 kg)   LMP  (Approximate)   SpO2 96%   BMI 48.03 kg/m  Well developed and well nourished in no acute distress HENT normal E scleral and icterus clear Neck Supple JVP flat; carotids brisk and full Clear to ausculation Regular rate and  rhythm, no murmurs gallops or rub Soft with active bowel sounds No clubbing cyanosis Trace Edema Alert and oriented, grossly normal motor and sensory function Skin Warm and Dry  ECG sinus at 96   Left bundle branch block  CrCl cannot be calculated (Patient's most recent lab result is older than the maximum 21 days allowed.).   Assessment and  Plan  Nonischemic cardiomyopathy  Left bundle branch block  Congestive heart failure class 3 acute chronic  Hypercoagulable state  chronic Eliquis  Stroke  We await repeat LV function assessment to determine whether she is appropriately considered for HV therapy in conjunction with CRT, Her ongoing CHF makes CRT appropriate to consider for relief of symptoms. Lenghty discussion about this with patient and her mom  Volume overloaded  will increase her fursoemide  from 40 >> 80 daily x 4d then resume prior dose   Continue corlanor, HR remains increased inaddition to carvediolol entresto spiro    Current medicines are reviewed at length with the patient today .  The patient does not  have concerns regarding medicines.

## 2022-04-23 NOTE — Patient Instructions (Signed)
Medication Instructions:  Your physician has recommended you make the following change in your medication:   Over the next week Dr Graciela Husbands would like for you to double your dose of Furosemide 40mg  to 80mg  (2 tablets) x 4 days. *If you need a refill on your cardiac medications before your next appointment, please call your pharmacy*   Lab Work: None ordered.  If you have labs (blood work) drawn today and your tests are completely normal, you will receive your results only by: MyChart Message (if you have MyChart) OR A paper copy in the mail If you have any lab test that is abnormal or we need to change your treatment, we will call you to review the results.   Testing/Procedures: CRT or cardiac resynchronization therapy is a treatment used to correct heart failure. When you have heart failure your heart is weakened and doesn't pump as well as it should. This therapy may help reduce symptoms and improve the quality of life.  Please see the handout/brochure given to you today to get more information of the different options of therapy.    Follow-Up: At [facility], you and your health needs are our priority.  As part of our continuing mission to provide you with exceptional heart care, we have created designated Provider Care Teams.  These Care Teams include your primary Cardiologist (physician) and Advanced Practice Providers (APPs -  Physician Assistants and Nurse Practitioners) who all work together to provide you with the care you need, when you need it.  We recommend signing up for the patient portal called "MyChart".  Sign up information is provided on this After Visit Summary.  MyChart is used to connect with patients for Virtual Visits (Telemedicine).  Patients are able to view lab/test results, encounter notes, upcoming appointments, etc.  Non-urgent messages can be sent to your provider as well.   To learn more about what you can do with MyChart, go to .     Your next appointment:   To be scheduled  Important Information About Sugar

## 2022-04-29 ENCOUNTER — Other Ambulatory Visit: Payer: Self-pay | Admitting: Cardiology

## 2022-05-02 ENCOUNTER — Telehealth (HOSPITAL_COMMUNITY): Payer: Self-pay | Admitting: *Deleted

## 2022-05-02 NOTE — Telephone Encounter (Signed)
Reaching out to patient to offer assistance regarding upcoming cardiac imaging study; pt verbalizes understanding of appt date/time, parking situation and where to check in, , and verified current allergies; name and call back number provided for further questions should they arise  Shelly Brick RN Navigator Cardiac Imaging [facility] [phone] office [phone] cell  Patient reports having an MRI before without incident. She is aware to removed her Dexcom monitor for MRI scan.

## 2022-05-03 ENCOUNTER — Other Ambulatory Visit: Payer: Self-pay | Admitting: Cardiology

## 2022-05-03 ENCOUNTER — Ambulatory Visit (HOSPITAL_COMMUNITY): Admission: RE | Admit: 2022-05-03 | Payer: 59 | Source: Ambulatory Visit

## 2022-05-03 ENCOUNTER — Telehealth: Payer: Self-pay | Admitting: Internal Medicine

## 2022-05-03 ENCOUNTER — Ambulatory Visit (HOSPITAL_COMMUNITY)
Admission: RE | Admit: 2022-05-03 | Discharge: 2022-05-03 | Disposition: A | Discharge status: Home / Self Care | Payer: 59 | Source: Ambulatory Visit | Attending: Cardiology | Admitting: Cardiology

## 2022-05-03 DIAGNOSIS — I428 Other cardiomyopathies: Secondary | ICD-10-CM

## 2022-05-03 DIAGNOSIS — I5022 Chronic systolic (congestive) heart failure: Secondary | ICD-10-CM

## 2022-05-03 MED ORDER — GADOBUTROL 1 MMOL/ML IV SOLN
10.0000 mL | Freq: Once | INTRAVENOUS | Status: AC | PRN
  Administered 2022-05-03: 10 mL via INTRAVENOUS

## 2022-05-03 NOTE — Telephone Encounter (Signed)
Pt sent a MyChart message to notify Dr Graciela Husbands and RN of cardiac MRI completion.  Copy of MRI provided to Dr Graciela Husbands for review.

## 2022-05-03 NOTE — Telephone Encounter (Signed)
New Message:     Patient  said she was supposed to call and let Mindi Junker know that she had her MRI today

## 2022-05-04 NOTE — Progress Notes (Signed)
She is getting CRT-D placed on 12/21. I will bring her in after that.  Staff, please make f/u sometime in Jan 2024.  Thanks MJP

## 2022-05-14 NOTE — Progress Notes (Signed)
Patient has an appt on 06/12/22 with Dr. Jacinto Halim

## 2022-05-16 ENCOUNTER — Ambulatory Visit: Payer: 59 | Attending: Internal Medicine

## 2022-05-16 DIAGNOSIS — I447 Left bundle-branch block, unspecified: Secondary | ICD-10-CM

## 2022-05-16 DIAGNOSIS — I428 Other cardiomyopathies: Secondary | ICD-10-CM

## 2022-05-16 DIAGNOSIS — Z01812 Encounter for preprocedural laboratory examination: Secondary | ICD-10-CM

## 2022-05-16 DIAGNOSIS — I5022 Chronic systolic (congestive) heart failure: Secondary | ICD-10-CM

## 2022-05-17 LAB — BASIC METABOLIC PANEL
BUN/Creatinine Ratio: 15 (ref 9–23)
BUN: 16 mg/dL (ref 6–24)
CO2: 21 mmol/L (ref 20–29)
Calcium: 9.1 mg/dL (ref 8.7–10.2)
Chloride: 109 mmol/L — ABNORMAL HIGH (ref 96–106)
Creatinine, Ser: 1.04 mg/dL — ABNORMAL HIGH (ref 0.57–1.00)
Glucose: 81 mg/dL (ref 70–99)
Potassium: 4 mmol/L (ref 3.5–5.2)
Sodium: 143 mmol/L (ref 134–144)
eGFR: 69 mL/min/{1.73_m2} (ref >59)

## 2022-05-17 LAB — CBC
Hematocrit: 43 % (ref 34.0–46.6)
Hemoglobin: 13.6 g/dL (ref 11.1–15.9)
MCH: 29.1 pg (ref 26.6–33.0)
MCHC: 31.6 g/dL (ref 31.5–35.7)
MCV: 92 fL (ref 79–97)
Platelets: 259 10*3/uL (ref 150–450)
RBC: 4.68 x10E6/uL (ref 3.77–5.28)
RDW: 13.2 % (ref 11.7–15.4)
WBC: 8.5 10*3/uL (ref 3.4–10.8)

## 2022-05-21 NOTE — Pre-Procedure Instructions (Signed)
Instructed patient on the following items: Arrival time 0600 Nothing to eat or drink after midnight No meds AM of procedure Responsible person to drive you home and stay with you for 24 hrs Wash with special soap night before and morning of procedure If on anti-coagulant drug instructions Eliquis- stop a few days ago

## 2022-05-22 ENCOUNTER — Ambulatory Visit (HOSPITAL_COMMUNITY)
Admission: AD | Disposition: A | Discharge status: Home / Self Care | Payer: Self-pay | Source: Ambulatory Visit | Attending: Internal Medicine

## 2022-05-22 ENCOUNTER — Observation Stay (HOSPITAL_COMMUNITY)
Admission: AD | Admit: 2022-05-22 | Discharge: 2022-05-23 | Disposition: A | Discharge status: Home / Self Care | Payer: 59 | Source: Ambulatory Visit | Attending: Internal Medicine | Admitting: Internal Medicine

## 2022-05-22 ENCOUNTER — Encounter: Payer: Self-pay | Admitting: Cardiology

## 2022-05-22 ENCOUNTER — Other Ambulatory Visit: Payer: Self-pay

## 2022-05-22 ENCOUNTER — Encounter (HOSPITAL_COMMUNITY)
Admission: AD | Disposition: A | Discharge status: Home / Self Care | Payer: 59 | Source: Ambulatory Visit | Attending: Internal Medicine

## 2022-05-22 ENCOUNTER — Ambulatory Visit (HOSPITAL_COMMUNITY): Payer: 59

## 2022-05-22 DIAGNOSIS — Z950 Presence of cardiac pacemaker: Secondary | ICD-10-CM | POA: Diagnosis present

## 2022-05-22 DIAGNOSIS — Z794 Long term (current) use of insulin: Secondary | ICD-10-CM | POA: Diagnosis not present

## 2022-05-22 DIAGNOSIS — E119 Type 2 diabetes mellitus without complications: Secondary | ICD-10-CM

## 2022-05-22 DIAGNOSIS — E1101 Type 2 diabetes mellitus with hyperosmolarity with coma: Secondary | ICD-10-CM

## 2022-05-22 DIAGNOSIS — T82120A Displacement of cardiac electrode, initial encounter: Secondary | ICD-10-CM | POA: Diagnosis not present

## 2022-05-22 DIAGNOSIS — Z8616 Personal history of COVID-19: Secondary | ICD-10-CM | POA: Insufficient documentation

## 2022-05-22 DIAGNOSIS — I110 Hypertensive heart disease with heart failure: Secondary | ICD-10-CM | POA: Insufficient documentation

## 2022-05-22 DIAGNOSIS — E870 Hyperosmolality and hypernatremia: Secondary | ICD-10-CM

## 2022-05-22 DIAGNOSIS — I5023 Acute on chronic systolic (congestive) heart failure: Secondary | ICD-10-CM | POA: Diagnosis not present

## 2022-05-22 DIAGNOSIS — Z01818 Encounter for other preprocedural examination: Secondary | ICD-10-CM

## 2022-05-22 DIAGNOSIS — J45909 Unspecified asthma, uncomplicated: Secondary | ICD-10-CM | POA: Insufficient documentation

## 2022-05-22 DIAGNOSIS — Z8673 Personal history of transient ischemic attack (TIA), and cerebral infarction without residual deficits: Secondary | ICD-10-CM | POA: Insufficient documentation

## 2022-05-22 DIAGNOSIS — Z7901 Long term (current) use of anticoagulants: Secondary | ICD-10-CM | POA: Diagnosis not present

## 2022-05-22 DIAGNOSIS — I428 Other cardiomyopathies: Secondary | ICD-10-CM | POA: Diagnosis present

## 2022-05-22 DIAGNOSIS — I447 Left bundle-branch block, unspecified: Principal | ICD-10-CM | POA: Insufficient documentation

## 2022-05-22 DIAGNOSIS — Z4502 Encounter for adjustment and management of automatic implantable cardiac defibrillator: Secondary | ICD-10-CM | POA: Insufficient documentation

## 2022-05-22 DIAGNOSIS — U099 Post covid-19 condition, unspecified: Secondary | ICD-10-CM

## 2022-05-22 DIAGNOSIS — Z79899 Other long term (current) drug therapy: Secondary | ICD-10-CM | POA: Diagnosis not present

## 2022-05-22 DIAGNOSIS — Z95810 Presence of automatic (implantable) cardiac defibrillator: Secondary | ICD-10-CM | POA: Insufficient documentation

## 2022-05-22 DIAGNOSIS — I5022 Chronic systolic (congestive) heart failure: Secondary | ICD-10-CM

## 2022-05-22 DIAGNOSIS — I10 Essential (primary) hypertension: Secondary | ICD-10-CM

## 2022-05-22 DIAGNOSIS — Z01810 Encounter for preprocedural cardiovascular examination: Secondary | ICD-10-CM

## 2022-05-22 DIAGNOSIS — I639 Cerebral infarction, unspecified: Secondary | ICD-10-CM

## 2022-05-22 DIAGNOSIS — I509 Heart failure, unspecified: Secondary | ICD-10-CM | POA: Diagnosis not present

## 2022-05-22 DIAGNOSIS — Z86711 Personal history of pulmonary embolism: Secondary | ICD-10-CM | POA: Insufficient documentation

## 2022-05-22 HISTORY — DX: Encounter for adjustment and management of automatic implantable cardiac defibrillator: Z45.02

## 2022-05-22 HISTORY — PX: BIV ICD INSERTION CRT-D: EP1195

## 2022-05-22 HISTORY — DX: Presence of automatic (implantable) cardiac defibrillator: Z95.810

## 2022-05-22 HISTORY — PX: LEAD REVISION/REPAIR: EP1213

## 2022-05-22 LAB — GLUCOSE, CAPILLARY
Glucose-Capillary: 131 mg/dL — ABNORMAL HIGH (ref 70–99)
Glucose-Capillary: 152 mg/dL — ABNORMAL HIGH (ref 70–99)

## 2022-05-22 LAB — PREGNANCY, URINE: Preg Test, Ur: NEGATIVE

## 2022-05-22 SURGERY — BIV ICD INSERTION CRT-D
Anesthesia: LOCAL

## 2022-05-22 SURGERY — LEAD REVISION/REPAIR
Anesthesia: LOCAL

## 2022-05-22 MED ORDER — ACETAMINOPHEN 325 MG PO TABS: 325.0000 mg | ORAL_TABLET | ORAL | Status: DC | PRN

## 2022-05-22 MED ORDER — FENTANYL CITRATE (PF) 100 MCG/2ML IJ SOLN
INTRAMUSCULAR | Status: DC | PRN
  Administered 2022-05-22 (×2): 25 ug via INTRAVENOUS
  Administered 2022-05-22: 25 ug
  Administered 2022-05-22: 25 ug via INTRAVENOUS

## 2022-05-22 MED ORDER — SODIUM CHLORIDE 0.9 % IV SOLN
80.0000 mg | INTRAVENOUS | Status: AC
  Administered 2022-05-22: 80 mg

## 2022-05-22 MED ORDER — CEFAZOLIN SODIUM-DEXTROSE 2-3 GM-%(50ML) IV SOLR
INTRAVENOUS | Status: AC | PRN
  Administered 2022-05-22: 2 g via INTRAVENOUS

## 2022-05-22 MED ORDER — CHLORHEXIDINE GLUCONATE 4 % EX LIQD: 4.0000 | Freq: Once | CUTANEOUS | Status: DC

## 2022-05-22 MED ORDER — SODIUM CHLORIDE 0.9 % IV SOLN: INTRAVENOUS | Status: DC

## 2022-05-22 MED ORDER — LIDOCAINE HCL (PF) 1 % IJ SOLN
INTRAMUSCULAR | Status: DC | PRN
  Administered 2022-05-22: 80 mL
  Administered 2022-05-22: 50 mL

## 2022-05-22 MED ORDER — CEFAZOLIN SODIUM-DEXTROSE 2-4 GM/100ML-% IV SOLN
INTRAVENOUS | Status: AC
  Filled 2022-05-22: qty 100

## 2022-05-22 MED ORDER — SODIUM CHLORIDE 0.9 % IV SOLN
INTRAVENOUS | Status: AC
  Filled 2022-05-22: qty 2

## 2022-05-22 MED ORDER — FENTANYL CITRATE (PF) 100 MCG/2ML IJ SOLN
INTRAMUSCULAR | Status: AC
  Filled 2022-05-22: qty 2

## 2022-05-22 MED ORDER — HEPARIN (PORCINE) IN NACL 1000-0.9 UT/500ML-% IV SOLN
INTRAVENOUS | Status: AC
  Filled 2022-05-22: qty 500

## 2022-05-22 MED ORDER — MIDAZOLAM HCL 5 MG/5ML IJ SOLN
INTRAMUSCULAR | Status: AC
  Filled 2022-05-22: qty 5

## 2022-05-22 MED ORDER — LIDOCAINE HCL (PF) 1 % IJ SOLN
INTRAMUSCULAR | Status: AC
  Filled 2022-05-22: qty 30

## 2022-05-22 MED ORDER — CEFAZOLIN SODIUM-DEXTROSE 1-4 GM/50ML-% IV SOLN
1.0000 g | Freq: Four times a day (QID) | INTRAVENOUS | Status: AC
  Administered 2022-05-22 – 2022-05-23 (×3): 1 g via INTRAVENOUS
  Filled 2022-05-22 (×3): qty 50

## 2022-05-22 MED ORDER — ACETAMINOPHEN 325 MG PO TABS
325.0000 mg | ORAL_TABLET | ORAL | Status: DC | PRN
  Administered 2022-05-22 – 2022-05-23 (×3): 650 mg via ORAL
  Filled 2022-05-22 (×3): qty 2

## 2022-05-22 MED ORDER — CEFAZOLIN SODIUM-DEXTROSE 2-4 GM/100ML-% IV SOLN
2.0000 g | INTRAVENOUS | Status: AC
  Administered 2022-05-22: 2 g via INTRAVENOUS

## 2022-05-22 MED ORDER — LIDOCAINE HCL (PF) 1 % IJ SOLN
INTRAMUSCULAR | Status: AC
  Filled 2022-05-22: qty 60

## 2022-05-22 MED ORDER — MIDAZOLAM HCL 5 MG/5ML IJ SOLN
INTRAMUSCULAR | Status: DC | PRN
  Administered 2022-05-22: 2 mg via INTRAVENOUS
  Administered 2022-05-22: 1 mg via INTRAVENOUS
  Administered 2022-05-22: 2 mg
  Administered 2022-05-22: 1 mg via INTRAVENOUS

## 2022-05-22 MED ORDER — POVIDONE-IODINE 10 % EX SWAB
2.0000 | Freq: Once | CUTANEOUS | Status: AC
  Administered 2022-05-22: 2 via TOPICAL

## 2022-05-22 MED ORDER — ONDANSETRON HCL 4 MG/2ML IJ SOLN: 4.0000 mg | Freq: Four times a day (QID) | INTRAMUSCULAR | Status: DC | PRN

## 2022-05-22 MED ORDER — HEPARIN (PORCINE) IN NACL 1000-0.9 UT/500ML-% IV SOLN
INTRAVENOUS | Status: DC | PRN
  Administered 2022-05-22 (×2): 500 mL

## 2022-05-22 SURGICAL SUPPLY — 14 items
CABLE SURGICAL S-101-97-12 (CABLE) ×1 IMPLANT
CATH RIGHTSITE C315HIS02 (CATHETERS) IMPLANT
ICD UNIFY ASURA CRT CD3357-40C (ICD Generator) IMPLANT
LEAD DURATA 7122-65CM (Lead) IMPLANT
LEAD SELECT SECURE 3830 383069 (Lead) IMPLANT
LEAD ULTIPACE 52 LPA1231/52 (Lead) IMPLANT
MAT PREVALON FULL STRYKER (MISCELLANEOUS) IMPLANT
PAD DEFIB RADIO PHYSIO CONN (PAD) ×1 IMPLANT
SELECT SECURE 3830 383069 (Lead) ×1 IMPLANT
SHEATH 7FR PRELUDE SNAP 13 (SHEATH) IMPLANT
SHEATH 9.5FR PRELUDE SNAP 13 (SHEATH) IMPLANT
SLITTER 6232ADJ (MISCELLANEOUS) IMPLANT
TRAY PACEMAKER INSERTION (PACKS) ×1 IMPLANT
WIRE HI TORQ VERSACORE-J 145CM (WIRE) IMPLANT

## 2022-05-22 SURGICAL SUPPLY — 5 items
CABLE SURGICAL S-101-97-12 (CABLE) ×1 IMPLANT
PAD DEFIB RADIO PHYSIO CONN (PAD) ×1 IMPLANT
POUCH AIGIS-R ANTIBACT ICD (Mesh General) ×1 IMPLANT
POUCH AIGIS-R ANTIBACT ICD LRG (Mesh General) IMPLANT
TRAY PACEMAKER INSERTION (PACKS) ×1 IMPLANT

## 2022-05-22 NOTE — Discharge Instructions (Signed)
     Supplemental Discharge Instructions for  Pacemaker/Defibrillator Patients   Activity No heavy lifting or vigorous activity with your left/right arm for 6 to 8 weeks.  Do not raise your left/right arm above your head for one week.  Gradually raise your affected arm as drawn below.           __  NO DRIVING for   1 week  ; you may begin driving on    09/01/52 .  WOUND CARE Keep the wound area clean and dry.  Do not get this area wet , no showers until cleared to at your wound check visit The tape/steri-strips on your wound will fall off; do not pull them off.  No bandage is needed on the site.  DO  NOT apply any creams, oils, or ointments to the wound area. If you notice any drainage or discharge from the wound, any swelling or bruising at the site, or you develop a fever > 101? F after you are discharged home, call the office at once.  Special Instructions You are still able to use cellular telephones; use the ear opposite the side where you have your pacemaker/defibrillator.  Avoid carrying your cellular phone near your device. When traveling through airports, show security personnel your identification card to avoid being screened in the metal detectors.  Ask the security personnel to use the hand wand. Avoid arc welding equipment, MRI testing (magnetic resonance imaging), TENS units (transcutaneous nerve stimulators).  Call the office for questions about other devices. Avoid electrical appliances that are in poor condition or are not properly grounded. Microwave ovens are safe to be near or to operate.  Additional information for defibrillator patients should your device go off: If your device goes off ONCE and you feel fine afterward, notify the device clinic nurses. If your device goes off ONCE and you do not feel well afterward, call 911. If your device goes off TWICE, call 911. If your device goes off THREE times in one day, call 911.  DO NOT DRIVE YOURSELF OR A FAMILY  MEMBER WITH A DEFIBRILLATOR TO THE HOSPITAL--CALL 911.

## 2022-05-22 NOTE — Progress Notes (Signed)
At discharge the pat suddencly complained of midsternal pleuritic chest pain; unfortunately rather vague historian, but over a period of observation for 2-3 hours she continued to complain of pleuritic pain, but also sternal discomfort which initially she said was similar and then thought distinct from the pleuritic pain and so I am suspecting microperforation of either the atrial ( probably) lead or the ventricular lead-- we have discussed returning to the lab for lead repositioning  No shortness of breath, no pulsus, no rub

## 2022-05-22 NOTE — Progress Notes (Signed)
Placed patient on CPAP for the night via auto-mode.  

## 2022-05-22 NOTE — Interval H&P Note (Signed)
History and Physical Interval Note:  05/22/2022 7:47 AM  Shelly Shields  has presented today for surgery, with the diagnosis of Cardiomyopathy.  The various methods of treatment have been discussed with the patient and family. After consideration of risks, benefits and other options for treatment, the patient has consented to  Procedure(s): BIV ICD INSERTION CRT-D (N/A) as a surgical intervention.  The patient's history has been reviewed, patient examined, no change in status, stable for surgery.  I have reviewed the patient's chart and labs.  Questions were answered to the patient's satisfaction.     [name] cMRI EF 34 %

## 2022-05-23 ENCOUNTER — Encounter (HOSPITAL_COMMUNITY): Payer: Self-pay | Admitting: Internal Medicine

## 2022-05-23 ENCOUNTER — Inpatient Hospital Stay (HOSPITAL_COMMUNITY): Payer: 59

## 2022-05-23 DIAGNOSIS — I447 Left bundle-branch block, unspecified: Secondary | ICD-10-CM | POA: Diagnosis not present

## 2022-05-23 DIAGNOSIS — Z86711 Personal history of pulmonary embolism: Secondary | ICD-10-CM | POA: Diagnosis not present

## 2022-05-23 DIAGNOSIS — Z8616 Personal history of COVID-19: Secondary | ICD-10-CM | POA: Diagnosis not present

## 2022-05-23 DIAGNOSIS — Z950 Presence of cardiac pacemaker: Secondary | ICD-10-CM

## 2022-05-23 DIAGNOSIS — Z8673 Personal history of transient ischemic attack (TIA), and cerebral infarction without residual deficits: Secondary | ICD-10-CM | POA: Diagnosis not present

## 2022-05-23 NOTE — Discharge Summary (Signed)
ELECTROPHYSIOLOGY PROCEDURE DISCHARGE SUMMARY    Patient ID: Shelly Shields,  MRN: [id number], DOB/AGE: 08/19/1980 41 y.o.  Admit date: 05/22/2022 Discharge date: 05/23/2022  Primary Care Physician: [name], MD  Primary Cardiologist: Dr. Virgina Jock Electrophysiologist: Dr. Caryl Comes  Primary Discharge Diagnosis:  NICM S/p ICD implant this admission  Secondary Discharge Diagnosis:  Stroke 2021 PE (In setting of COVID 2021) HTN HLD OSA  Allergies  Allergen Reactions   Lisinopril Cough   Metformin Diarrhea   Farxiga [Dapagliflozin]     Recurrent yeast infections   Ibuprofen Hives   Ketorolac Hives     Procedures This Admission:  1.  Implantation of an Abbott CRT-D on 05/23/22 by Dr Caryl Comes.   DFT's were deferred at time of implant  2. ICD lead revision 05/23/22, Dr.Klein  CXR on 05/23/22 demonstrated no pneumothorax status post device implantation.   Brief HPI: Shelly Shields is a 41 y.o. female was referred to electrophysiology in the outpatient setting for consideration of ICD implantation.  Past medical history includes above.  The patient has persistent LV dysfunction despite guideline directed therapy.  Risks, benefits, and alternatives to ICD implantation were reviewed with the patient who wished to proceed.   Hospital Course:  The patient was admitted and underwent implantation of a CRT-D with details as outlined in the procedure note.   She felt well  initially after her implant though after her returning from radiology developed acute sharp/pleuritic CP with concerns of perforation, brought back to the lab and had her RA/RV (HV lead) were revised. She was monitored on telemetry overnight which demonstrated SR/VPaced.  Left chest was without hematoma or ecchymosis.  The device was interrogated and found to be functioning normally.  CXR was obtained and demonstrated no pneumothorax status post device implantation.  Wound care, arm mobility, and  restrictions were reviewed with the patient.  The patient has some site discomfort though no ongoing CP or SOB, she was examined by Dr. Quentin Ore and considered stable for discharge to home.   The patient's discharge medications include an ACE/ARB (Entresto) and beta blocker (carvedilol).   Given her hx of prior stoke as well as PE (w/COVID) will resume her Eliquis tonight.  Physical Exam: Vitals:   05/22/22 1958 05/22/22 2316 05/22/22 2342 05/23/22 0500  BP: 132/86  137/78 125/79  Pulse: 61 75 70 66  Resp: _0 Temp: 98.8 F (37.1 C)  98.7 F (37.1 C) 98.4 F (36.9 C)  TempSrc: Oral  Oral Axillary  SpO2: 98% 96% 100% 98%  Weight: 112.5 kg     Height: _1  (1.549 m)       GEN- The patient is well appearing, alert and oriented x 3 today.   HEENT: normocephalic, atraumatic; sclera clear, conjunctiva pink; hearing intact; oropharynx clear Lungs-  CTA b/l, normal work of breathing.  No wheezes, rales, rhonchi Heart-  RRR, no murmurs, rubs or gallops, PMI not laterally displaced GI- soft, non-tender, non-distended Extremities- no clubbing, cyanosis, or edema MS- no significant deformity or atrophy Skin- warm and dry, no rash or lesion, left chest without hematoma/ecchymosis Psych- euthymic mood, full affect Neuro- no gross defecits  Labs:   Lab Results  Component Value Date   WBC 8.5 05/16/2022   HGB 13.6 05/16/2022   HCT 43.0 05/16/2022   MCV 92 05/16/2022   PLT 259 05/16/2022    Recent Labs  Lab 05/16/22 0922  NA 143  K 4.0  CL  109*  CO2 21  BUN 16  CREATININE 1.04*  CALCIUM 9.1  GLUCOSE 81    Discharge Medications:  Allergies as of 05/23/2022       Reactions   Lisinopril Cough   Metformin Diarrhea   Farxiga [dapagliflozin]    Recurrent yeast infections   Ibuprofen Hives   Ketorolac Hives        Medication List     TAKE these medications    Accu-Chek Nano SmartView w/Device Kit 1 Device by Does not apply route 4 (four) times daily -  after meals and at bedtime.   accu-chek softclix lancets Use as instructed for blood glucose checks four times daily, before meals and at bedtime   apixaban 5 MG Tabs tablet Commonly known as: ELIQUIS Take 1 tablet (5 mg total) by mouth 2 (two) times daily. Resume on 11/2 morning   B-12 1000 MCG Subl Place 1,000 mcg under the tongue daily.   Baclofen 5 MG Tabs Take 5-10 mg by mouth daily as needed (Migraines).   butalbital-acetaminophen-caffeine 50-325-40 MG tablet Commonly known as: FIORICET Take 1 tablet by mouth every 6 (six) hours as needed for headache.   carvedilol 25 MG tablet Commonly known as: COREG TAKE 2 TABLETS(50 MG) BY MOUTH TWICE DAILY WITH A MEAL   Dexcom G6 Sensor Misc SMARTSIG:1 Topical Every 10 Days   Dexcom G6 Transmitter Misc See admin instructions.   Entresto 97-103 MG Generic drug: sacubitril-valsartan TAKE 1 TABLET BY MOUTH TWICE DAILY   escitalopram 10 MG tablet Commonly known as: LEXAPRO Take 10 mg by mouth daily.   fexofenadine 180 MG tablet Commonly known as: ALLEGRA Take 180 mg by mouth daily.   Flovent HFA 110 MCG/ACT inhaler Generic drug: fluticasone Inhale 2 puffs into the lungs 2 (two) times daily.   fluticasone 50 MCG/ACT nasal spray Commonly known as: FLONASE Place 2 sprays into both nostrils daily as needed for allergies.   furosemide 40 MG tablet Commonly known as: LASIX TAKE 1 TABLET(40 MG) BY MOUTH DAILY AS NEEDED FOR FLUID RETENTION OR SWELLING   gabapentin 300 MG capsule Commonly known as: NEURONTIN Take 300 mg by mouth 3 (three) times daily.   glucose blood test strip Commonly known as: Accu-Chek SmartView Check sugar 6 x daily What changed: additional instructions   HumaLOG KwikPen 200 UNIT/ML KwikPen Generic drug: insulin lispro Inject 10-50 Units into the skin See admin instructions. Via Insulin Pump   Insulin Pen Needle 32G X 4 MM Misc Commonly known as: Insupen Pen Needles BD Pen Needles- brand  specific. Inject insulin via insulin pen daily   ivabradine 5 MG Tabs tablet Commonly known as: Corlanor Take 1 tablet (5 mg total) by mouth 2 (two) times daily with a meal.   medroxyPROGESTERone 150 MG/ML injection Commonly known as: DEPO-PROVERA Inject 150 mg into the muscle every 3 (three) months.   methocarbamol 500 MG tablet Commonly known as: ROBAXIN Take 1 tablet (500 mg total) by mouth 2 (two) times daily. What changed:  when to take this reasons to take this   montelukast 10 MG tablet Commonly known as: SINGULAIR Take 10 mg by mouth at bedtime.   Multi-Vitamin tablet Take 1 tablet by mouth daily.   nortriptyline 10 MG capsule Commonly known as: PAMELOR Take 20 mg by mouth at bedtime.   Nurtec 75 MG Tbdp Generic drug: Rimegepant Sulfate Take 75 mg by mouth daily as needed (migraines).   Omnipod DASH Pods (Gen 4) Misc 200 Units every other day. Humalog  ondansetron 4 MG disintegrating tablet Commonly known as: ZOFRAN-ODT Take 1 tablet (4 mg total) by mouth every 8 (eight) hours as needed for nausea or vomiting.   pantoprazole 40 MG tablet Commonly known as: PROTONIX Take 40 mg by mouth daily.   rosuvastatin 20 MG tablet Commonly known as: CRESTOR Take 1 tablet (20 mg total) by mouth daily at 6 PM.   spironolactone 25 MG tablet Commonly known as: ALDACTONE Take 0.5 tablets (12.5 mg total) by mouth daily.   topiramate 100 MG tablet Commonly known as: TOPAMAX Take 100 mg by mouth 2 (two) times daily.   TRUEplus Lancets 33G Misc USE TO CHECK BLOOD GLUCOSE QID - AFTER MEALS AND AT BEDTIME   Ventolin HFA 108 (90 Base) MCG/ACT inhaler Generic drug: albuterol Use as directed 2 puffs in the mouth or throat 4 (four) times daily as needed for shortness of breath.        Disposition: Home Discharge Instructions     Diet - low sodium heart healthy   Complete by: As directed    Increase activity slowly   Complete by: As directed         Duration  of Discharge Encounter: Greater than 30 minutes including physician time.  Venetia Night, PA-C 05/23/2022 8:56 AM

## 2022-05-23 NOTE — Progress Notes (Signed)
Pt discharging home with family.  All instructions given and reviewed, all questions answered.   °

## 2022-05-29 MED FILL — Fentanyl Citrate Preservative Free (PF) Inj 100 MCG/2ML: INTRAMUSCULAR | Qty: 2 | Status: AC

## 2022-06-05 ENCOUNTER — Ambulatory Visit: Payer: 59 | Attending: Internal Medicine

## 2022-06-05 DIAGNOSIS — I428 Other cardiomyopathies: Secondary | ICD-10-CM

## 2022-06-05 LAB — CUP PACEART INCLINIC DEVICE CHECK
Battery Remaining Longevity: 44 mo
Brady Statistic RA Percent Paced: 0.02 %
Brady Statistic RV Percent Paced: 99.98 %
Date Time Interrogation Session: 20240110135315
HighPow Impedance: 45 Ohm
Implantable Lead Connection Status: 753985
Implantable Lead Connection Status: 753985
Implantable Lead Connection Status: 753985
Implantable Lead Implant Date: 20231227
Implantable Lead Implant Date: 20231227
Implantable Lead Implant Date: 20231227
Implantable Lead Location: 753858
Implantable Lead Location: 753859
Implantable Lead Location: 753860
Implantable Lead Model: 3830
Implantable Lead Model: 7122
Implantable Pulse Generator Implant Date: 20231227
Lead Channel Impedance Value: 412.5 Ohm
Lead Channel Impedance Value: 475 Ohm
Lead Channel Impedance Value: 512.5 Ohm
Lead Channel Pacing Threshold Amplitude: 0.5 V
Lead Channel Pacing Threshold Amplitude: 0.5 V
Lead Channel Pacing Threshold Amplitude: 0.75 V
Lead Channel Pacing Threshold Amplitude: 0.75 V
Lead Channel Pacing Threshold Amplitude: 0.75 V
Lead Channel Pacing Threshold Amplitude: 0.75 V
Lead Channel Pacing Threshold Pulse Width: 0.5 ms
Lead Channel Pacing Threshold Pulse Width: 0.5 ms
Lead Channel Pacing Threshold Pulse Width: 0.5 ms
Lead Channel Pacing Threshold Pulse Width: 0.5 ms
Lead Channel Pacing Threshold Pulse Width: 0.5 ms
Lead Channel Pacing Threshold Pulse Width: 0.5 ms
Lead Channel Sensing Intrinsic Amplitude: 12 mV
Lead Channel Sensing Intrinsic Amplitude: 3.2 mV
Lead Channel Setting Pacing Amplitude: 3.5 V
Lead Channel Setting Pacing Amplitude: 3.5 V
Lead Channel Setting Pacing Amplitude: 3.5 V
Lead Channel Setting Pacing Pulse Width: 0.5 ms
Lead Channel Setting Pacing Pulse Width: 0.5 ms
Lead Channel Setting Sensing Sensitivity: 0.5 mV
Pulse Gen Serial Number: 5556392
Zone Setting Status: 755011

## 2022-06-05 NOTE — Progress Notes (Signed)
Wound check appointment. Steri-strips removed. Wound without redness or edema. Incision edges approximated, wound well healed with one area at distal end of wound there is a noted flap.  No open or draining area but is still fragile in healing process.  No re-striping needed, patient informed to monitor area and if does not continue to heal or s/s of infection develop, call device clinic at number provided.  Patient verbalizes understanding.  Normal device function.  CRT-D device, BiVP effective >99%. Thresholds, sensing, and impedances consistent with implant measurements. Device programmed at 3.5V for extra safety margin until 3 month visit. Histogram distribution appropriate for patient and level of activity. No mode switches or ventricular arrhythmias noted. Patient educated about wound care, arm mobility, lifting restrictions, shock plan. ROV in 3 months with implanting physician.

## 2022-06-05 NOTE — Patient Instructions (Signed)
   After Your ICD (Implantable Cardiac Defibrillator)    Monitor your defibrillator site for redness, swelling, and drainage. Call the device clinic at [phone] if you experience these symptoms or fever/chills.  Your incision was closed with Steri-strips or staples:  You may shower 7 days after your procedure and wash your incision with soap and water. Avoid lotions, ointments, or perfumes over your incision until it is well-healed.  You may use a hot tub or a pool after your wound check appointment if the incision is completely closed.  Do not lift, push or pull greater than 10 pounds with the affected arm until 6 weeks after your procedure. There are no other restrictions in arm movement after your wound check appointment. UNTIL AFTER FEBRUARY 7TH.    Your ICD is designed to protect you from life threatening heart rhythms. Because of this, you may receive a shock.   1 shock with no symptoms:  Call the office during business hours. 1 shock with symptoms (chest pain, chest pressure, dizziness, lightheadedness, shortness of breath, overall feeling unwell):  Call 911. If you experience 2 or more shocks in 24 hours:  Call 911. If you receive a shock, you should not drive.  [place] DMV - no driving for 6 months if you receive appropriate therapy from your ICD.   ICD Alerts:  Some alerts are vibratory and others beep. These are NOT emergencies. Please call our office to let us know. If this occurs at night or on weekends, it can wait until the next business day. Send a remote transmission.  If your device is capable of reading fluid status (for heart failure), you will be offered monthly monitoring to review this with you.   Remote monitoring is used to monitor your ICD from home. This monitoring is scheduled every 91 days by our office. It allows [place] to keep an eye on the functioning of your device to ensure it is working properly. You will routinely see your Electrophysiologist annually (more  often if necessary).

## 2022-06-11 ENCOUNTER — Ambulatory Visit: Payer: 59 | Admitting: Cardiology

## 2022-06-11 ENCOUNTER — Encounter: Payer: Self-pay | Admitting: Cardiology

## 2022-06-11 VITALS — BP 134/76 | HR 72 | Resp 16 | Ht 61.0 in | Wt 253.8 lb

## 2022-06-11 DIAGNOSIS — I10 Essential (primary) hypertension: Secondary | ICD-10-CM

## 2022-06-11 DIAGNOSIS — I428 Other cardiomyopathies: Secondary | ICD-10-CM

## 2022-06-11 DIAGNOSIS — Z95810 Presence of automatic (implantable) cardiac defibrillator: Secondary | ICD-10-CM

## 2022-06-11 DIAGNOSIS — I5022 Chronic systolic (congestive) heart failure: Secondary | ICD-10-CM

## 2022-06-11 DIAGNOSIS — D6869 Other thrombophilia: Secondary | ICD-10-CM

## 2022-06-11 NOTE — Progress Notes (Signed)
Primary Physician/Referring:  [name], MD  Patient ID: Shelly Shields, female    DOB: 1981-04-23, 42 y.o.   MRN: [id number]  Chief Complaint  Patient presents with   CRT-D   NICM (nonischemic cardiomyopathy)   HPI:    Shelly Shields  is a 42 y.o. African-American female with hypertension, hyperlipidemia, uncontrolled diabetes mellitus, obesity and obstructive sleep apnea on CPAP and history of Covid pneumonia in January 2021 during which time she had pulmonary embolism. 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  She has been on anticoagulation chronically with Eliquis.  Hypercoagulable workup was negative.  She underwent BiV ICD implantation on 05/22/2022.  She is here on a 15-month office visit and follow-up of chronic systolic and diastolic heart failure, hypertension management.  Patient is presently doing well, unfortunately had to go back for ICD wire dislodgment the same day.  She has recuperated well.  Except for chronic dyspnea no other specific complaints.  No leg edema, no PND or orthopnea.  Past Medical History:  Diagnosis Date   Asthma    Chronic systolic heart failure ([place]) 03/26/2021   COVID-19 long hauler    Diabetes mellitus without complication ([place])    Encounter for assessment of implantable cardioverter-defibrillator (ICD) 05/22/2022   Hypertension    ICD Abbott Unify Asura Biventricular implantable cardioverter-defibrillator (ICD) in situ 05/22/2022 05/22/2022   Migraines    Obesity    Pulmonary embolism ([place]) 05/2019   with COVID   Sleep apnea    Past Surgical History:  Procedure Laterality Date   BILATERAL CARPAL TUNNEL RELEASE     BIV ICD INSERTION CRT-D N/A 05/22/2022   Procedure: BIV ICD INSERTION CRT-D;  Surgeon: Deboraha Sprang, MD;  Location: [facility];  Service: Cardiovascular;  Laterality: N/A;   LEAD REVISION/REPAIR N/A 05/22/2022    Procedure: LEAD REVISION/REPAIR;  Surgeon: Deboraha Sprang, MD;  Location: [facility];  Service: Cardiovascular;  Laterality: N/A;   LEFT HEART CATH AND CORONARY ANGIOGRAPHY N/A 03/27/2021   Procedure: LEFT HEART CATH AND CORONARY ANGIOGRAPHY;  Surgeon: Nigel Mormon, MD;  Location: [facility];  Service: Cardiovascular;  Laterality: N/A;   TEE WITHOUT CARDIOVERSION N/A 03/28/2020   Procedure: TRANSESOPHAGEAL ECHOCARDIOGRAM (TEE);  Surgeon: Adrian Prows, MD;  Location: [facility];  Service: Cardiovascular;  Laterality: N/A;   Family History  Problem Relation Age of Onset   Hypertension Mother    Thyroid disease Mother    Hypertension Father    Stroke Father 85   Diabetes Father    Sleep apnea Father    Colon cancer Maternal Uncle 60   Aneurysm Paternal Aunt 68   CVA Paternal Uncle 1   Cancer - Lung Maternal Grandmother    Heart failure Paternal Grandfather     Social History   Tobacco Use   Smoking status: Never   Smokeless tobacco: Never  Substance Use Topics   Alcohol use: Yes    Alcohol/week: 0.0 standard drinks of alcohol    Comment: occasional    Marital Status: Single   ROS  Review of Systems  Cardiovascular:  Positive for dyspnea on exertion. Negative for chest pain and leg swelling.    Objective  Blood pressure 134/76, pulse 72, resp. rate 16, height 5\' 1"  (1.549 m), weight 253 lb 12.8 oz (115.1 kg), SpO2 93 %.     06/11/2022   11:32 AM 05/23/2022  5:00 AM 05/22/2022   11:42 PM  Vitals with BMI  Height 5\' 1"     Weight 253 lbs 13 oz    BMI 47.98    Systolic 134 125  Diastolic 76 79 78  Pulse 72 66 70      Physical Exam Vitals reviewed.  Neck:     Vascular: No carotid bruit or JVD.  Cardiovascular:     Rate and Rhythm: Normal rate and regular rhythm.     Pulses: Normal pulses and intact distal pulses.     Heart sounds: S1 normal and S2 normal. No murmur heard.    No gallop.     Comments: No JVD.  Pulmonary:     Effort:  Pulmonary effort is normal.     Breath sounds: Normal breath sounds.  Abdominal:     General: Bowel sounds are normal.     Palpations: Abdomen is soft.  Musculoskeletal:     Right lower leg: No edema.     Left lower leg: No edema.  Neurological:     Cranial Nerves: No cranial nerve deficit.     Gait: Gait (Essentially normal) normal.   Physical exam unchanged compared to previous office visit.  Laboratory examination:   Recent Labs    09/09/21 1347 09/20/21 0959 12/11/21 1712 12/14/21 0942 02/25/22 1544 05/16/22 0922  NA 139   < > 138 139 143 143  K 3.6   < > 3.9 4.2 3.8 4.0  CL 108   < > 109 107* 110 109*  CO2 24   < > 21* 19* 27 21  GLUCOSE 244*   < > 152* 199* 115* 81  BUN 9   < > 13 14 14 16   CREATININE 0.92   < > 1.35* 1.04* 0.97 1.04*  CALCIUM 8.8*   < > 8.8* 9.2 9.0 9.1  GFRNONAA >60  --  51*  --  >60  --    < > = values in this interval not displayed.   CrCl cannot be calculated (Patient's most recent lab result is older than the maximum 21 days allowed.).     Latest Ref Rng & Units 05/16/2022    9:22 AM 02/25/2022    3:44 PM 12/14/2021    9:42 AM  CMP  Glucose 70 - 99 mg/dL 81  04/27/2022  12/16/2021   BUN 6 - 24 mg/dL 16  14  14    Creatinine 0.57 - 1.00 mg/dL 272  536    Sodium 134 - 144 mmol/L 143  143  139   Potassium 3.5 - 5.2 mmol/L 4.0  3.8  4.2   Chloride 96 - 106 mmol/L 109  110  107   CO2 20 - 29 mmol/L 21  27  19    Calcium 8.7 - 10.2 mg/dL 9.1  9.0  9.2   Total Protein 6.5 - 8.1 g/dL  6.6    Total Bilirubin 0.3 - 1.2 mg/dL  0.5    Alkaline Phos 38 - 126 U/L  78    AST 15 - 41 U/L  15    ALT 0 - 44 U/L  21        Latest Ref Rng & Units 05/16/2022    9:22 AM 02/25/2022    3:44 PM 12/11/2021    5:12 PM  CBC  WBC 3.4 - 10.8 x10E3/uL 8.5  10.1  8.6   Hemoglobin 11.1 - 15.9 g/dL  05/18/2022  04/27/2022   Hematocrit 34.0 - 46.6 %  43.0  38.7  41.2   Platelets 150 - 450 x10E3/uL 259  343  295     Lipid Panel No results for input(s): "CHOL", "TRIG",  "LDLCALC", "VLDL", "HDL", "CHOLHDL", "LDLDIRECT" in the last 8760 hours.   HEMOGLOBIN A1C Lab Results  Component Value Date   HGBA1C 11.2 (H) 03/27/2020   MPG 274.74 03/27/2020   TSH No results for input(s): "TSH" in the last 8760 hours.  BNP    Component Value Date/Time   BNP 132.0 (H) 12/11/2021 1730    ProBNP    Component Value Date/Time   PROBNP 543 (H) 12/14/2021 0942   External labs:  None   Allergies   Allergies  Allergen Reactions   Farxiga [Dapagliflozin] Other (See Comments)    Recurrent yeast infections   Lisinopril Cough   Metformin Diarrhea   Ibuprofen Hives   Ketorolac Hives    Final Medications at End of Visit     Current Outpatient Medications:    apixaban (ELIQUIS) 5 MG TABS tablet, Take 1 tablet (5 mg total) by mouth 2 (two) times daily. Resume on 11/2 morning, Disp: 60 tablet, Rfl: 0   Baclofen 5 MG TABS, Take 5-10 mg by mouth daily as needed (Migraines)., Disp: , Rfl:    Blood Glucose Monitoring Suppl (ACCU-CHEK NANO SMARTVIEW) W/DEVICE KIT, 1 Device by Does not apply route 4 (four) times daily - after meals and at bedtime., Disp: 1 kit, Rfl: 0   butalbital-acetaminophen-caffeine (FIORICET) 50-325-40 MG tablet, Take 1 tablet by mouth every 6 (six) hours as needed for headache., Disp: , Rfl:    carvedilol (COREG) 25 MG tablet, TAKE 2 TABLETS(50 MG) BY MOUTH TWICE DAILY WITH A MEAL, Disp: 180 tablet, Rfl: 3   Continuous Blood Gluc Sensor (DEXCOM G6 SENSOR) MISC, SMARTSIG:1 Topical Every 10 Days, Disp: , Rfl:    Continuous Blood Gluc Transmit (DEXCOM G6 TRANSMITTER) MISC, See admin instructions., Disp: , Rfl:    Cyanocobalamin (B-12) 1000 MCG SUBL, Place 1,000 mcg under the tongue daily., Disp: , Rfl:    escitalopram (LEXAPRO) 10 MG tablet, Take 10 mg by mouth daily., Disp: , Rfl:    fexofenadine (ALLEGRA) 180 MG tablet, Take 180 mg by mouth daily., Disp: , Rfl:    FLOVENT HFA 110 MCG/ACT inhaler, Inhale 2 puffs into the lungs 2 (two) times daily.,  Disp: , Rfl:    fluticasone (FLONASE) 50 MCG/ACT nasal spray, Place 2 sprays into both nostrils daily as needed for allergies. , Disp: , Rfl: 3   furosemide (LASIX) 40 MG tablet, TAKE 1 TABLET(40 MG) BY MOUTH DAILY AS NEEDED FOR FLUID RETENTION OR SWELLING, Disp: 90 tablet, Rfl: 3   gabapentin (NEURONTIN) 300 MG capsule, Take 300 mg by mouth 3 (three) times daily., Disp: , Rfl:    glucose blood (ACCU-CHEK SMARTVIEW) test strip, Check sugar 6 x daily (Patient taking differently: Check sugar 6 x daily. Uses true test instead), Disp: 200 each, Rfl: 3   HUMALOG KWIKPEN 200 UNIT/ML KwikPen, Inject 10-50 Units into the skin See admin instructions. Via Insulin Pump, Disp: , Rfl:    Insulin Disposable Pump (OMNIPOD DASH PODS, GEN 4,) MISC, 200 Units every other day. Humalog, Disp: , Rfl:    Insulin Pen Needle (INSUPEN PEN NEEDLES) 32G X 4 MM MISC, BD Pen Needles- brand specific. Inject insulin via insulin pen daily, Disp: 200 each, Rfl: 3   ivabradine (CORLANOR) 5 MG TABS tablet, Take 1 tablet (5 mg total) by mouth 2 (two) times daily with a  meal., Disp: 180 tablet, Rfl: 3   Lancet Devices (ACCU-CHEK SOFTCLIX) lancets, Use as instructed for blood glucose checks four times daily, before meals and at bedtime, Disp: 100 each, Rfl: 5   medroxyPROGESTERone (DEPO-PROVERA) 150 MG/ML injection, Inject 150 mg into the muscle every 3 (three) months., Disp: , Rfl:    methocarbamol (ROBAXIN) 500 MG tablet, Take 1 tablet (500 mg total) by mouth 2 (two) times daily. (Patient taking differently: Take 500 mg by mouth every 8 (eight) hours as needed for muscle spasms.), Disp: 26 tablet, Rfl: 0   montelukast (SINGULAIR) 10 MG tablet, Take 10 mg by mouth at bedtime., Disp: , Rfl:    Multiple Vitamin (MULTI-VITAMIN) tablet, Take 1 tablet by mouth daily., Disp: , Rfl:    nortriptyline (PAMELOR) 10 MG capsule, Take 20 mg by mouth at bedtime., Disp: , Rfl:    NURTEC 75 MG TBDP, Take 75 mg by mouth daily as needed (migraines).,  Disp: , Rfl:    ondansetron (ZOFRAN-ODT) 4 MG disintegrating tablet, Take 1 tablet (4 mg total) by mouth every 8 (eight) hours as needed for nausea or vomiting., Disp: 20 tablet, Rfl: 0   pantoprazole (PROTONIX) 40 MG tablet, Take 40 mg by mouth daily., Disp: , Rfl:    rosuvastatin (CRESTOR) 20 MG tablet, Take 1 tablet (20 mg total) by mouth daily at 6 PM., Disp: 30 tablet, Rfl: 0   sacubitril-valsartan (ENTRESTO) 97-103 MG, TAKE 1 TABLET BY MOUTH TWICE DAILY, Disp: 180 tablet, Rfl: 1   spironolactone (ALDACTONE) 25 MG tablet, Take 0.5 tablets (12.5 mg total) by mouth daily., Disp: 45 tablet, Rfl: 3   topiramate (TOPAMAX) 100 MG tablet, Take 100 mg by mouth 2 (two) times daily., Disp: , Rfl:    TRUEPLUS LANCETS 33G MISC, USE TO CHECK BLOOD GLUCOSE QID - AFTER MEALS AND AT BEDTIME, Disp: , Rfl: 3   VENTOLIN HFA 108 (90 BASE) MCG/ACT inhaler, Use as directed 2 puffs in the mouth or throat 4 (four) times daily as needed for shortness of breath. , Disp: , Rfl: 3   Radiology:   Cardiac Studies:   PCV MYOCARDIAL PERFUSION WITH LEXISCAN 08/14/2020 Lexiscan nuclear stress test performed using 1-day protocol. Left ventricle is dilated. Severe decrease in global myocardial thickening and wall motion. Stress LVEF <20%. Large sized, medium intensity, fixed perfusion defect in anteroapical, apical, inferior, inferoseptal myocardium. High risk study   LONG TERM MONITOR (8-14 DAYS) 06/22/20-07/06/20:  Predominant underlying rhythm was sinus with minimum heart rate of 71 bpm, maximum heart rate of 193 bpm average heart rate of 91 bpm.  There were no patient triggered events.  PACs and PVCs were rare.  Patient with intermittent right bundle branch block.  2 episodes of supraventricular tachycardia with the longest lasting 12 seconds and a maximum heart rate of 93 bpm.  No evidence of atrial fibrillation.  No ventricular tachycardia, high degree AV block, pauses >3 seconds.  Left heart catheterization 03/27/2021:   LM: Normal LAD: Normal         Prox diag 1 30% disease Lcx: Normal RCA: Normal   Mildly decompensated nonischemic cardiomyopathy  PCV ECHOCARDIOGRAM COMPLETE 04/12/2021 Left ventricle cavity is normal in size. Normal left ventricular wall thickness. Severe global hypokinesis. LVEF 25-30%. Indeterminate diastolic filling pattern. No significant valvular abnormality. Normal right atrial pressure. No significant change compared to previous study on 07/25/2020. Continue GDMT for HFrEF  Cardiac MR 05/03/2022: Moderate left ventricular enlargement and global hypokinesis, EF 34%.  Estimated cardiac output 4.4 L/min.  Normal RV size and function, RVEF 51%. Normal cardiac valves. Trivial pericardial effusion. No late gadolinium uptake.  EKG:     EKG 05/23/2022:  Atrially sensed  And biventricularly paced  Rhythm at the rate of 71 bpm.  No further analysis.    Ultimately  Compared to 09/20/2021,  Wide QRS rhythm  QRS widening with  LBBB pattern no longer present.   Assessment     ICD-10-CM   1. NICM (nonischemic cardiomyopathy) (HCC)  I42.8     2. Chronic systolic heart failure (HCC)  D53.29     3. Essential hypertension  I10     4. Secondary hypercoagulable state (HCC)  D68.69     5. ICD Abbott Unify Asura Biventricular implantable cardioverter-defibrillator (ICD) in situ 05/22/2022  Z95.810     6. Class 3 severe obesity due to excess calories with serious comorbidity and body mass index (BMI) of 40.0 to 44.9 in adult [facility])  E66.01    Z68.41       Medications Discontinued During This Encounter  Medication Reason   ondansetron (ZOFRAN) 4 MG tablet      No orders of the defined types were placed in this encounter.   Recommendations:   CHENEE MUNNS is a 42 y.o. African-American female with hypertension, hyperlipidemia, uncontrolled diabetes mellitus, obesity and obstructive sleep apnea on CPAP and history of Covid pneumonia in January 2021 during which time she had pulmonary  embolism. 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  She has been on anticoagulation chronically with Eliquis.  She underwent BiV ICD implantation on 05/22/2022.  She is here on a 4-month office visit and follow-up of chronic systolic and diastolic heart failure, hypertension management.  1. NICM (nonischemic cardiomyopathy) (HCC) Patient is presently doing well and no clinical evidence of heart failure.  I reviewed her chart, after ICD implantation, she had to go back for revision as the lead had dislodged.  She has recuperated well, ICD site has healed well.  2. Chronic systolic heart failure (HCC) She is not in any acute decompensated heart failure.  3. Essential hypertension Blood pressure under excellent control.  She is on guideline directed medical therapy with Entresto and also carvedilol.  4. Secondary hypercoagulable state (HCC) Patient on chronic anticoagulation, hypercoagulable workup was negative.  5. ICD Abbott Unify Asura Biventricular implantable cardioverter-defibrillator (ICD) in situ 05/22/2022 Patient has not collected her transmitter, she is making arrangements for electrical hookup.  Discussed with her importance of regular transmissions.  I reviewed the recent in office check that was performed, normal function.  QRS is also narrowed significantly since BiV ICD implantation.  Expect LVEF to improve.  Will repeat echocardiogram in 6 months.  6.  Morbid obesity Patient was started on GLP 1 agonist, however she did not tolerate this as she had marked satiety.  I have extensive discussion with the patient that it is doing its job, she could certainly reduce the dose of insulin that she is taking at high dose to avoid hypoglycemia and would want her to try the Trulicity which is very appropriate for her which will help with weight loss as well as control of diabetes.  Discussed  the effects of Trulicity, mechanism of action.  Hopefully she will restart Trulicity.  Otherwise she is stable from cardiac standpoint, I will see him back in 6 months.  This was a 40-minute office visit encounter in discussion regarding her ICD implant,  review of her labs, discussions regarding weight loss and counseling.    Adrian Prows, MD, [facility] 06/11/2022, 12:15 PM Office: [phone] Fax: [phone] Pager: [phone]

## 2022-06-12 ENCOUNTER — Ambulatory Visit: Payer: 59 | Admitting: Cardiology

## 2022-07-05 ENCOUNTER — Encounter (HOSPITAL_COMMUNITY): Payer: Self-pay

## 2022-07-31 ENCOUNTER — Other Ambulatory Visit: Payer: Self-pay

## 2022-07-31 DIAGNOSIS — I420 Dilated cardiomyopathy: Secondary | ICD-10-CM

## 2022-07-31 DIAGNOSIS — I5022 Chronic systolic (congestive) heart failure: Secondary | ICD-10-CM

## 2022-07-31 MED ORDER — IVABRADINE HCL 5 MG PO TABS: 5.0000 mg | ORAL_TABLET | Freq: Two times a day (BID) | ORAL | 3 refills | Status: DC

## 2022-08-01 ENCOUNTER — Ambulatory Visit
Admission: EM | Admit: 2022-08-01 | Discharge: 2022-08-01 | Disposition: A | Discharge status: Home / Self Care | Payer: 59 | Attending: Internal Medicine | Admitting: Internal Medicine

## 2022-08-01 DIAGNOSIS — J45909 Unspecified asthma, uncomplicated: Secondary | ICD-10-CM | POA: Insufficient documentation

## 2022-08-01 DIAGNOSIS — J029 Acute pharyngitis, unspecified: Secondary | ICD-10-CM

## 2022-08-01 DIAGNOSIS — R059 Cough, unspecified: Secondary | ICD-10-CM | POA: Diagnosis present

## 2022-08-01 DIAGNOSIS — J069 Acute upper respiratory infection, unspecified: Secondary | ICD-10-CM | POA: Diagnosis not present

## 2022-08-01 DIAGNOSIS — Z1152 Encounter for screening for COVID-19: Secondary | ICD-10-CM | POA: Insufficient documentation

## 2022-08-01 LAB — POCT INFLUENZA A/B
Influenza A, POC: NEGATIVE
Influenza B, POC: NEGATIVE

## 2022-08-01 LAB — POCT RAPID STREP A (OFFICE): Rapid Strep A Screen: NEGATIVE

## 2022-08-01 MED ORDER — BENZONATATE 100 MG PO CAPS: 100.0000 mg | ORAL_CAPSULE | Freq: Three times a day (TID) | ORAL | 0 refills | Status: DC | PRN

## 2022-08-01 NOTE — ED Triage Notes (Signed)
Pt states cough,sore throat,and runny nose for the past 3 days.  States she has been taking OTC cold medication at home.

## 2022-08-01 NOTE — ED Provider Notes (Signed)
[facility]    CSN: [id number] Arrival date & time: 08/01/22  1059      History   Chief Complaint Chief Complaint  Patient presents with   Cough    HPI Shelly Shields is a 42 y.o. female.   Patient presents with cough, sore throat, runny nose, nasal congestion, generalized bodyaches that started about 2 to 3 days ago.  Patient denies any documented fevers.  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  Patient has taken Coricidin HBP over-the-counter with some improvement in symptoms.  Patient reports history of asthma and she has has been using her albuterol inhaler with some improvement.  Denies chest pain, shortness of breath, gastrointestinal symptoms.   Cough   Past Medical History:  Diagnosis Date   Asthma    Chronic systolic heart failure ([place]) 03/26/2021   COVID-19 long hauler    Diabetes mellitus without complication ([place])    Encounter for assessment of implantable cardioverter-defibrillator (ICD) 05/22/2022   Hypertension    ICD Abbott Unify Asura Biventricular implantable cardioverter-defibrillator (ICD) in situ 05/22/2022 05/22/2022   Migraines    Obesity    Pulmonary embolism ([place]) 05/2019   with COVID   Sleep apnea     Patient Active Problem List   Diagnosis Date Noted   Encounter for assessment of implantable cardioverter-defibrillator (ICD) 05/22/2022   ICD Abbott Unify Asura Biventricular implantable cardioverter-defibrillator (ICD) in situ 05/22/2022 05/22/2022   Pacemaker 05/22/2022   NICM (nonischemic cardiomyopathy) ([place]) 04/23/2022   LBBB (left bundle branch block) [id number]   Chronic systolic heart failure ([place]) 03/26/2021   Acute CVA (cerebrovascular accident) ([place]) 03/27/2020   Sleep apnea    Diabetes mellitus without complication ([place])    [id number] long hauler    Obesity    Acute pulmonary embolism ([place]) 06/30/2019   Pulmonary  embolism ([place]) 05/2019   Hypernatremia    Asthma, chronic    DKA (diabetic ketoacidoses) 11/27/2014   Diabetes mellitus, new onset ([place])    Hyperglycemic hyperosmolar nonketotic coma ([place])    Essential hypertension     Past Surgical History:  Procedure Laterality Date   BILATERAL CARPAL TUNNEL RELEASE     BIV ICD INSERTION CRT-D N/A 05/22/2022   Procedure: BIV ICD INSERTION CRT-D;  Surgeon: Deboraha Sprang, MD;  Location: [place];  Service: Cardiovascular;  Laterality: N/A;   LEAD REVISION/REPAIR N/A 05/22/2022   Procedure: LEAD REVISION/REPAIR;  Surgeon: Deboraha Sprang, MD;  Location: [place] [facility];  Service: Cardiovascular;  Laterality: N/A;   LEFT HEART CATH AND CORONARY ANGIOGRAPHY N/A 03/27/2021   Procedure: LEFT HEART CATH AND CORONARY ANGIOGRAPHY;  Surgeon: Nigel Mormon, MD;  Location: [facility];  Service: Cardiovascular;  Laterality: N/A;   TEE WITHOUT CARDIOVERSION N/A 03/28/2020   Procedure: TRANSESOPHAGEAL ECHOCARDIOGRAM (TEE);  Surgeon: Adrian Prows, MD;  Location: [facility]COPY;  Service: Cardiovascular;  Laterality: N/A;    OB History   No obstetric history on file.      Home Medications    Prior to Admission medications   Medication Sig Start Date End Date Taking? Authorizing Provider  benzonatate (TESSALON) 100 MG capsule Take 1 capsule (100 mg total) by mouth every 8 (eight) hours as needed for cough. 08/01/22  Yes [place] Crance, Michele Rockers, FNP  apixaban (ELIQUIS) 5 MG TABS tablet Take 1 tablet (5 mg total) by mouth 2 (two) times  daily. Resume on 11/2 morning 03/27/21   Patwardhan, Reynold Bowen, MD  Baclofen 5 MG TABS Take 5-10 mg by mouth daily as needed (Migraines). 08/09/19   [provider]  Blood Glucose Monitoring Suppl (ACCU-CHEK NANO SMARTVIEW) W/DEVICE KIT 1 Device by Does not apply route 4 (four) times daily - after meals and at bedtime. 11/29/14   Frazier Richards, MD  butalbital-acetaminophen-caffeine (FIORICET) [phone] MG tablet Take  1 tablet by mouth every 6 (six) hours as needed for headache. 12/11/21   [provider]  carvedilol (COREG) 25 MG tablet TAKE 2 TABLETS(50 MG) BY MOUTH TWICE DAILY WITH A MEAL 01/14/22   Patwardhan, Manish J, MD  Continuous Blood Gluc Sensor (DEXCOM G6 SENSOR) MISC SMARTSIG:1 Topical Every 10 Days 10/18/20   [provider]  Continuous Blood Gluc Transmit (DEXCOM G6 TRANSMITTER) MISC See admin instructions. 08/16/20   [provider]  Cyanocobalamin (B-12) 1000 MCG SUBL Place 1,000 mcg under the tongue daily.    [provider]  escitalopram (LEXAPRO) 10 MG tablet Take 10 mg by mouth daily. 03/01/20   [provider]  fexofenadine (ALLEGRA) 180 MG tablet Take 180 mg by mouth daily.    [provider]  FLOVENT HFA 110 MCG/ACT inhaler Inhale 2 puffs into the lungs 2 (two) times daily. 04/26/21   [provider]  fluticasone (FLONASE) 50 MCG/ACT nasal spray Place 2 sprays into both nostrils daily as needed for allergies.  09/22/14   [provider]  furosemide (LASIX) 40 MG tablet TAKE 1 TABLET(40 MG) BY MOUTH DAILY AS NEEDED FOR FLUID RETENTION OR SWELLING 04/01/22   Patwardhan, Manish J, MD  gabapentin (NEURONTIN) 300 MG capsule Take 300 mg by mouth 3 (three) times daily. 02/22/22   [provider]  glucose blood (ACCU-CHEK SMARTVIEW) test strip Check sugar 6 x daily Patient taking differently: Check sugar 6 x daily. Uses true test instead 11/29/14   Frazier Richards, MD  HUMALOG KWIKPEN 200 UNIT/ML KwikPen Inject 10-50 Units into the skin See admin instructions. Via Insulin Pump 09/06/21   [provider]  Insulin Disposable Pump (OMNIPOD DASH PODS, GEN 4,) MISC 200 Units every other day. Humalog 02/23/21   [provider]  Insulin Pen Needle (INSUPEN PEN NEEDLES) 32G X 4 MM MISC BD Pen Needles- brand specific. Inject insulin via insulin pen daily 11/29/14   Frazier Richards, MD  ivabradine (CORLANOR) 5 MG TABS tablet  Take 1 tablet (5 mg total) by mouth 2 (two) times daily with a meal. 07/31/22   Adrian Prows, MD  Lancet Devices [facility]) lancets Use as instructed for blood glucose checks four times daily, before meals and at bedtime 11/29/14   Frazier Richards, MD  medroxyPROGESTERone (DEPO-PROVERA) 150 MG/ML injection Inject 150 mg into the muscle every 3 (three) months.    [provider]  methocarbamol (ROBAXIN) 500 MG tablet Take 1 tablet (500 mg total) by mouth 2 (two) times daily. Patient taking differently: Take 500 mg by mouth every 8 (eight) hours as needed for muscle spasms. 03/30/22   Prosperi, Christian H, PA-C  montelukast (SINGULAIR) 10 MG tablet Take 10 mg by mouth at bedtime.    [provider]  Multiple Vitamin (MULTI-VITAMIN) tablet Take 1 tablet by mouth daily.    [provider]  nortriptyline (PAMELOR) 10 MG capsule Take 20 mg by mouth at bedtime.    [provider]  NURTEC 75 MG TBDP Take 75 mg by mouth daily as needed (  migraines). 04/02/21   [provider]  ondansetron (ZOFRAN-ODT) 4 MG disintegrating tablet Take 1 tablet (4 mg total) by mouth every 8 (eight) hours as needed for nausea or vomiting. 09/09/21   Deatra Canter, Amjad, PA-C  pantoprazole (PROTONIX) 40 MG tablet Take 40 mg by mouth daily.    [provider]  rosuvastatin (CRESTOR) 20 MG tablet Take 1 tablet (20 mg total) by mouth daily at 6 PM. 03/30/20   Elgergawy, Silver Huguenin, MD  sacubitril-valsartan (ENTRESTO) 97-103 MG TAKE 1 TABLET BY MOUTH TWICE DAILY 04/29/22   Patwardhan, Reynold Bowen, MD  spironolactone (ALDACTONE) 25 MG tablet Take 0.5 tablets (12.5 mg total) by mouth daily. 01/29/22   Deboraha Sprang, MD  topiramate (TOPAMAX) 100 MG tablet Take 100 mg by mouth 2 (two) times daily.    [provider]  TRUEPLUS LANCETS 33G MISC USE TO CHECK BLOOD GLUCOSE QID - AFTER MEALS AND AT BEDTIME 12/22/14   [provider]  VENTOLIN HFA 108 (90 BASE) MCG/ACT inhaler Use as  directed 2 puffs in the mouth or throat 4 (four) times daily as needed for shortness of breath.  12/21/14   [provider]    Family History Family History  Problem Relation Age of Onset   Hypertension Mother    Thyroid disease Mother    Hypertension Father    Stroke Father 62   Diabetes Father    Sleep apnea Father    Colon cancer Maternal Uncle 31   Aneurysm Paternal Aunt 68   CVA Paternal Uncle 71   Cancer - Lung Maternal Grandmother    Heart failure Paternal Grandfather     Social History Social History   Tobacco Use   Smoking status: Never   Smokeless tobacco: Never  Vaping Use   Vaping Use: Never used  Substance Use Topics   Alcohol use: Yes    Alcohol/week: 0.0 standard drinks of alcohol    Comment: occasional    Drug use: No     Allergies   Farxiga [dapagliflozin], Lisinopril, Metformin, Ibuprofen, and Ketorolac   Review of Systems Review of Systems Per HPI  Physical Exam Triage Vital Signs ED Triage Vitals  Enc Vitals Group     BP 08/01/22 1241 133/79     Pulse Rate 08/01/22 1241 83     Resp 08/01/22 1241 16     Temp 08/01/22 1241 98.3 F (36.8 C)     Temp Source 08/01/22 1241 Oral     SpO2 08/01/22 1241 96 %     Weight --      Height --      Head Circumference --      Peak Flow --      Pain Score 08/01/22 1243 0     Pain Loc --      Pain Edu? --      Excl. in [place]? --    No data found.  Updated Vital Signs BP 133/79 (BP Location: Left Arm)   Pulse 83   Temp 98.3 F (36.8 C) (Oral)   Resp 16   SpO2 96%   Visual Acuity Right Eye Distance:   Left Eye Distance:   Bilateral Distance:    Right Eye Near:   Left Eye Near:    Bilateral Near:     Physical Exam Constitutional:      General: She is not in acute distress.    Appearance: Normal appearance. She is not toxic-appearing or diaphoretic.  HENT:     Head:  Normocephalic and atraumatic.     Right Ear: Tympanic membrane and ear canal normal.     Left Ear: Tympanic  membrane and ear canal normal.     Nose: Congestion present.     Mouth/Throat:     Mouth: Mucous membranes are moist.     Pharynx: Posterior oropharyngeal erythema present.  Eyes:     Extraocular Movements: Extraocular movements intact.     Conjunctiva/sclera: Conjunctivae normal.     Pupils: Pupils are equal, round, and reactive to light.  Cardiovascular:     Rate and Rhythm: Normal rate and regular rhythm.     Pulses: Normal pulses.     Heart sounds: Normal heart sounds.  Pulmonary:     Effort: Pulmonary effort is normal. No respiratory distress.     Breath sounds: Normal breath sounds. No stridor. No wheezing, rhonchi or rales.  Abdominal:     General: Abdomen is flat. Bowel sounds are normal.     Palpations: Abdomen is soft.  Musculoskeletal:        General: Normal range of motion.     Cervical back: Normal range of motion.  Skin:    General: Skin is warm and dry.  Neurological:     General: No focal deficit present.     Mental Status: She is alert and oriented to person, place, and time. Mental status is at baseline.  Psychiatric:        Mood and Affect: Mood normal.        Behavior: Behavior normal.      UC Treatments / Results  Labs (all labs ordered are listed, but only abnormal results are displayed) Labs Reviewed  CULTURE, GROUP A STREP ([place])  SARS CORONAVIRUS 2 (TAT 6-24 HRS)  POCT INFLUENZA A/B  POCT RAPID STREP A (OFFICE)    EKG   Radiology No results found.  Procedures Procedures (including critical care time)  Medications Ordered in UC Medications - No data to display  Initial Impression / Assessment and Plan / UC Course  I have reviewed the triage vital signs and the nursing notes.  Pertinent labs & imaging results that were available during my care of the patient were reviewed by me and considered in my medical decision making (see chart for details).     Patient presents with symptoms likely from a viral upper respiratory infection. Do  not suspect underlying cardiopulmonary process. Symptoms seem unlikely related to ACS, CHF or COPD exacerbations, pneumonia, pneumothorax. Patient is nontoxic appearing and not in need of emergent medical intervention.  Rapid flu and rapid strep are negative.  Throat culture and COVID test pending.  Do not have flu PCR capabilities in urgent care at this time.  Recommended symptom control with medications and supportive care.   Return if symptoms fail to improve. Patient states understanding and is agreeable.  Discharged with PCP followup.  Final Clinical Impressions(s) / UC Diagnoses   Final diagnoses:  Viral upper respiratory tract infection with cough  Sore throat     Discharge Instructions      Rapid flu and rapid strep are negative.  Throat culture and COVID test are pending.  We will call if they are positive.  I have prescribed you a cough medication to take as needed.  Encourage saline nasal sprays and using humidifier which should be helpful.  Ensure adequate fluid hydration and rest.  Follow-up if any symptoms persist or worsen.     ED Prescriptions     Medication Sig Dispense Auth.  Provider   benzonatate (TESSALON) 100 MG capsule Take 1 capsule (100 mg total) by mouth every 8 (eight) hours as needed for cough. 21 capsule [place], Michele Rockers, [place]      PDMP not reviewed this encounter.   Teodora Medici, [place] 08/01/22 1324

## 2022-08-01 NOTE — Discharge Instructions (Signed)
Rapid flu and rapid strep are negative.  Throat culture and COVID test are pending.  We will call if they are positive.  I have prescribed you a cough medication to take as needed.  Encourage saline nasal sprays and using humidifier which should be helpful.  Ensure adequate fluid hydration and rest.  Follow-up if any symptoms persist or worsen.

## 2022-08-02 LAB — SARS CORONAVIRUS 2 (TAT 6-24 HRS): SARS Coronavirus 2: NEGATIVE

## 2022-08-04 LAB — CULTURE, GROUP A STREP (THRC)

## 2022-08-24 ENCOUNTER — Encounter: Payer: Self-pay | Admitting: Cardiology

## 2022-08-27 ENCOUNTER — Ambulatory Visit: Payer: 59 | Attending: Internal Medicine | Admitting: Internal Medicine

## 2022-08-27 ENCOUNTER — Encounter: Payer: Self-pay | Admitting: Internal Medicine

## 2022-08-27 VITALS — BP 134/88 | HR 80 | Ht 61.0 in | Wt 260.0 lb

## 2022-08-27 DIAGNOSIS — I428 Other cardiomyopathies: Secondary | ICD-10-CM | POA: Diagnosis not present

## 2022-08-27 DIAGNOSIS — I5022 Chronic systolic (congestive) heart failure: Secondary | ICD-10-CM | POA: Diagnosis not present

## 2022-08-27 DIAGNOSIS — I447 Left bundle-branch block, unspecified: Secondary | ICD-10-CM

## 2022-08-27 DIAGNOSIS — Z95810 Presence of automatic (implantable) cardiac defibrillator: Secondary | ICD-10-CM | POA: Diagnosis not present

## 2022-08-27 NOTE — Patient Instructions (Signed)
Medication Instructions:  Your physician recommends that you continue on your current medications as directed. Please refer to the Current Medication list given to you today.  *If you need a refill on your cardiac medications before your next appointment, please call your pharmacy*   Lab Work: None ordered.  If you have labs (blood work) drawn today and your tests are completely normal, you will receive your results only by: MyChart Message (if you have MyChart) OR A paper copy in the mail If you have any lab test that is abnormal or we need to change your treatment, we will call you to review the results.   Testing/Procedures: None ordered.    Follow-Up: At [facility], you and your health needs are our priority.  As part of our continuing mission to provide you with exceptional heart care, we have created designated Provider Care Teams.  These Care Teams include your primary Cardiologist (physician) and Advanced Practice Providers (APPs -  Physician Assistants and Nurse Practitioners) who all work together to provide you with the care you need, when you need it.  We recommend signing up for the patient portal called "MyChart".  Sign up information is provided on this After Visit Summary.  MyChart is used to connect with patients for Virtual Visits (Telemedicine).  Patients are able to view lab/test results, encounter notes, upcoming appointments, etc.  Non-urgent messages can be sent to your provider as well.   To learn more about what you can do with MyChart, go to [email or web address].    Your next appointment:   9 months with Dr Klein 

## 2022-08-27 NOTE — Progress Notes (Signed)
Patient Care Team: [name], MD as PCP - General (Family Medicine)   HPI  Shelly Shields is a 42 y.o. female seen following initial consultation 9/23 for consideration of an ICD in the setting of nonischemic cardiomyopathy and class III CHF.  Also had left bundle branch block and so was clearly a candidate for CRT; with persistent left ventricular dysfunction, underwent cardiac resynchronization with a L BBA lead implantation [id number] complicated by micro lead perforation manifested by pain prompting lead revision  History of a prior stroke; history of pulmonary embolism in the context of antecedent COVID is started on apixaban.  With her history of strokes, emboli and family history, she was referred to hematology for a hypercoagulable work-up.  "Does not appear to have a concern for inherited thrombophilia "  For sleep at his untreated, because of drooling, falls or stroke.  Her shortness of breath is better.  Not great.  Fatigue.  Some itching at her device site  DATE TEST EF    2/21 Echo  50-55%  %    11/21 Echo  20-25% %    11/21 TEE   Small interatrial shunt  3/22 Echo  20-25%    11/22 LHC   Non obstructive CAD   12/23 cMRI  34%      Date Cr K Hgb  7/23 1.04 4.2 13.2   12/23 1.04 4.0 13.6    Records and Results Reviewed   Past Medical History:  Diagnosis Date   Asthma    Chronic systolic heart failure [id number]   COVID-19 long hauler    Diabetes mellitus without complication    Encounter for assessment of implantable cardioverter-defibrillator (ICD) 05/22/2022   Hypertension    ICD Abbott Unify Asura Biventricular implantable cardioverter-defibrillator (ICD) in situ 05/22/2022 05/22/2022   Migraines    Obesity    Pulmonary embolism 05/2019   with COVID   Sleep apnea     Past Surgical History:  Procedure Laterality Date   BILATERAL CARPAL TUNNEL RELEASE     BIV ICD INSERTION CRT-D N/A 05/22/2022   Procedure: BIV ICD INSERTION CRT-D;   Surgeon: Deboraha Sprang, MD;  Location: [facility];  Service: Cardiovascular;  Laterality: N/A;   LEAD REVISION/REPAIR N/A 05/22/2022   Procedure: LEAD REVISION/REPAIR;  Surgeon: Deboraha Sprang, MD;  Location: [place];  Service: Cardiovascular;  Laterality: N/A;   LEFT HEART CATH AND CORONARY ANGIOGRAPHY N/A 03/27/2021   Procedure: LEFT HEART CATH AND CORONARY ANGIOGRAPHY;  Surgeon: Nigel Mormon, MD;  Location: [place] [facility];  Service: Cardiovascular;  Laterality: N/A;   TEE WITHOUT CARDIOVERSION N/A 03/28/2020   Procedure: TRANSESOPHAGEAL ECHOCARDIOGRAM (TEE);  Surgeon: Adrian Prows, MD;  [facility];  Service: Cardiovascular;  Laterality: N/A;    Current Meds  Medication Sig   apixaban (ELIQUIS) 5 MG TABS tablet Take 1 tablet (5 mg total) by mouth 2 (two) times daily. Resume on 11/2 morning   Baclofen 5 MG TABS Take 5-10 mg by mouth daily as needed (Migraines).   benzonatate (TESSALON) 100 MG capsule Take 1 capsule (100 mg total) by mouth every 8 (eight) hours as needed for cough.   Blood Glucose Monitoring Suppl (ACCU-CHEK NANO SMARTVIEW) W/DEVICE KIT 1 Device by Does not apply route 4 (four) times daily - after meals and at bedtime.   butalbital-acetaminophen-caffeine (FIORICET) 50-325-40 MG tablet Take 1 tablet by mouth every 6 (six) hours as needed for headache.  carvedilol (COREG) 25 MG tablet TAKE 2 TABLETS(50 MG) BY MOUTH TWICE DAILY WITH A MEAL   Continuous Blood Gluc Sensor (DEXCOM G6 SENSOR) MISC SMARTSIG:1 Topical Every 10 Days   Continuous Blood Gluc Transmit (DEXCOM G6 TRANSMITTER) MISC See admin instructions.   Cyanocobalamin (B-12) 1000 MCG SUBL Place 1,000 mcg under the tongue daily.   escitalopram (LEXAPRO) 10 MG tablet Take 10 mg by mouth daily.   fexofenadine (ALLEGRA) 180 MG tablet Take 180 mg by mouth daily.   FLOVENT HFA 110 MCG/ACT inhaler Inhale 2 puffs into the lungs 2 (two) times daily.   fluticasone (FLONASE) 50 MCG/ACT  nasal spray Place 2 sprays into both nostrils daily as needed for allergies.    furosemide (LASIX) 40 MG tablet TAKE 1 TABLET(40 MG) BY MOUTH DAILY AS NEEDED FOR FLUID RETENTION OR SWELLING   gabapentin (NEURONTIN) 300 MG capsule Take 300 mg by mouth 3 (three) times daily.   glucose blood (ACCU-CHEK SMARTVIEW) test strip Check sugar 6 x daily (Patient taking differently: Check sugar 6 x daily. Uses true test instead)   HUMALOG KWIKPEN 200 UNIT/ML KwikPen Inject 10-50 Units into the skin See admin instructions. Via Insulin Pump   Insulin Disposable Pump (OMNIPOD DASH PODS, GEN 4,) MISC 200 Units every other day. Humalog   Insulin Pen Needle (INSUPEN PEN NEEDLES) 32G X 4 MM MISC BD Pen Needles- brand specific. Inject insulin via insulin pen daily   ivabradine (CORLANOR) 5 MG TABS tablet Take 1 tablet (5 mg total) by mouth 2 (two) times daily with a meal.   Lancet Devices (ACCU-CHEK SOFTCLIX) lancets Use as instructed for blood glucose checks four times daily, before meals and at bedtime   medroxyPROGESTERone (DEPO-PROVERA) 150 MG/ML injection Inject 150 mg into the muscle every 3 (three) months.   methocarbamol (ROBAXIN) 500 MG tablet Take 1 tablet (500 mg total) by mouth 2 (two) times daily. (Patient taking differently: Take 500 mg by mouth every 8 (eight) hours as needed for muscle spasms.)   montelukast (SINGULAIR) 10 MG tablet Take 10 mg by mouth at bedtime.   Multiple Vitamin (MULTI-VITAMIN) tablet Take 1 tablet by mouth daily.   nortriptyline (PAMELOR) 10 MG capsule Take 20 mg by mouth at bedtime.   NURTEC 75 MG TBDP Take 75 mg by mouth daily as needed (migraines).   ondansetron (ZOFRAN-ODT) 4 MG disintegrating tablet Take 1 tablet (4 mg total) by mouth every 8 (eight) hours as needed for nausea or vomiting.   pantoprazole (PROTONIX) 40 MG tablet Take 40 mg by mouth daily.   rosuvastatin (CRESTOR) 20 MG tablet Take 1 tablet (20 mg total) by mouth daily at 6 PM.   sacubitril-valsartan (ENTRESTO)  97-103 MG TAKE 1 TABLET BY MOUTH TWICE DAILY   spironolactone (ALDACTONE) 25 MG tablet Take 0.5 tablets (12.5 mg total) by mouth daily.   topiramate (TOPAMAX) 100 MG tablet Take 100 mg by mouth 2 (two) times daily.   TRUEPLUS LANCETS 33G MISC USE TO CHECK BLOOD GLUCOSE QID - AFTER MEALS AND AT BEDTIME   VENTOLIN HFA 108 (90 BASE) MCG/ACT inhaler Use as directed 2 puffs in the mouth or throat 4 (four) times daily as needed for shortness of breath.     Allergies  Allergen Reactions   Farxiga [Dapagliflozin] Other (See Comments)    Recurrent yeast infections   Lisinopril Cough   Metformin Diarrhea   Ibuprofen Hives   Ketorolac Hives      Review of Systems negative except from HPI and PMH  Physical Exam BP 134/88   Pulse 80   Ht 5\' 1"  (1.549 m)   Wt 260 lb (117.9 kg)   SpO2 95%   BMI 49.13 kg/m  Well developed and well nourished in no acute distress HENT normal E scleral and icterus clear Neck Supple JVP flat; carotids brisk and full Clear to ausculation Regular rate and rhythm, no murmurs gallops or rub Soft with active bowel sounds No clubbing cyanosis Trace Edema Alert and oriented, grossly normal motor and sensory function Skin Warm and Dry  ECG sinus @ 80 17/09/39  CrCl cannot be calculated (Patient's most recent lab result is older than the maximum 21 days allowed.).   Assessment and  Plan  Nonischemic cardiomyopathy  Left bundle branch block  Congestive heart failure class 3 acute chronic  Hypercoagulable state chronic Eliquis  Stroke  Cardiac resynchronization-defibrillator with LBBA pacing   Electrical resynchronization is outstanding with a QRS duration of less than 100 ms.  Symptoms are modestly improved.  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  She is to see neurology and I suggested that they discuss the role of glycopyrrolate.  Continue her guideline directed therapies for her cardiomyopathy with Entresto carvedilol and  spironolactone.  No bleeding.  Continue Eliquis   Current medicines are reviewed at length with the patient today .  The patient does not  have concerns regarding medicines.

## 2022-08-29 ENCOUNTER — Telehealth: Payer: Self-pay

## 2022-08-29 NOTE — Telephone Encounter (Signed)
The patient states that Dr. Caryl Comes did something to the device and now she is sore.

## 2022-08-29 NOTE — Telephone Encounter (Signed)
Patient reports of a discomfort near her device site since changes were made by Dr. Caryl Comes on 08/27/22.   Reviewed with Dr. Caryl Comes an verbal orders to program PAV to 120 ms & SAV to 100 ms.   Device Clinic apt made for 08/30/22 @ 4:00 PM.

## 2022-08-30 ENCOUNTER — Ambulatory Visit: Payer: 59

## 2022-08-30 NOTE — Telephone Encounter (Signed)
Outreach made to Pt.  She has some soreness at the device site.    Reviewed programming changes made.  None of these changes would cause soreness at Pt's implant site.  Discussed with AT and confirmed.  Advised Pt programming changes would not make device site sore.  Advised this could be part of the normal healing process.  Advised Pt will cancel appt today and follow up with her next week to see if this has resolved.  Pt in agreement.  Will continue to monitor.

## 2022-09-27 ENCOUNTER — Ambulatory Visit: Payer: 59 | Admitting: Cardiology

## 2022-10-04 ENCOUNTER — Encounter: Payer: Self-pay | Admitting: Cardiology

## 2022-10-04 ENCOUNTER — Ambulatory Visit: Payer: 59 | Admitting: Cardiology

## 2022-10-04 VITALS — BP 156/85 | HR 80 | Ht 61.0 in | Wt 263.6 lb

## 2022-10-04 DIAGNOSIS — I10 Essential (primary) hypertension: Secondary | ICD-10-CM

## 2022-10-04 DIAGNOSIS — I5022 Chronic systolic (congestive) heart failure: Secondary | ICD-10-CM

## 2022-10-04 DIAGNOSIS — I428 Other cardiomyopathies: Secondary | ICD-10-CM

## 2022-10-04 MED ORDER — SPIRONOLACTONE 25 MG PO TABS: 25.0000 mg | ORAL_TABLET | Freq: Every day | ORAL | 3 refills | Status: DC

## 2022-10-04 NOTE — Progress Notes (Signed)
Follow up visit  Subjective:   Shelly Shields, female    DOB: [birth year], 42 y.o.   MRN: [id number]  HPI  Chief Complaint  Patient presents with   Chronic systolic heart failure [facility])   Follow-up    41 y/o [place] American female with hypertension, hyperlipidemia, type 2 DM, obesity, OSA on CPAP, h/o PE in the setting of COVID (2021), nonishcemic cardiomyopathy-now s/p CRT-D, embolic stroke (40/9811)  Denies any new complaints.  Blood pressure is elevated.    Current Outpatient Medications:    apixaban (ELIQUIS) 5 MG TABS tablet, Take 1 tablet (5 mg total) by mouth 2 (two) times daily. Resume on 11/2 morning, Disp: 60 tablet, Rfl: 0   Baclofen 5 MG TABS, Take 5-10 mg by mouth daily as needed (Migraines)., Disp: , Rfl:    benzonatate (TESSALON) 100 MG capsule, Take 1 capsule (100 mg total) by mouth every 8 (eight) hours as needed for cough., Disp: 21 capsule, Rfl: 0   Blood Glucose Monitoring Suppl (ACCU-CHEK NANO SMARTVIEW) W/DEVICE KIT, 1 Device by Does not apply route 4 (four) times daily - after meals and at bedtime., Disp: 1 kit, Rfl: 0   butalbital-acetaminophen-caffeine (FIORICET) 50-325-40 MG tablet, Take 1 tablet by mouth every 6 (six) hours as needed for headache., Disp: , Rfl:    carvedilol (COREG) 25 MG tablet, TAKE 2 TABLETS(50 MG) BY MOUTH TWICE DAILY WITH A MEAL, Disp: 180 tablet, Rfl: 3   Continuous Blood Gluc Sensor (DEXCOM G6 SENSOR) MISC, SMARTSIG:1 Topical Every 10 Days, Disp: , Rfl:    Continuous Blood Gluc Transmit (DEXCOM G6 TRANSMITTER) MISC, See admin instructions., Disp: , Rfl:    Cyanocobalamin (B-12) 1000 MCG SUBL, Place 1,000 mcg under the tongue daily., Disp: , Rfl:    escitalopram (LEXAPRO) 10 MG tablet, Take 10 mg by mouth daily., Disp: , Rfl:    fexofenadine (ALLEGRA) 180 MG tablet, Take 180 mg by mouth daily., Disp: , Rfl:    FLOVENT HFA 110 MCG/ACT inhaler, Inhale 2 puffs into the lungs 2 (two) times daily., Disp: , Rfl:    fluticasone (FLONASE)  50 MCG/ACT nasal spray, Place 2 sprays into both nostrils daily as needed for allergies. , Disp: , Rfl: 3   furosemide (LASIX) 40 MG tablet, TAKE 1 TABLET(40 MG) BY MOUTH DAILY AS NEEDED FOR FLUID RETENTION OR SWELLING, Disp: 90 tablet, Rfl: 3   gabapentin (NEURONTIN) 300 MG capsule, Take 300 mg by mouth 3 (three) times daily., Disp: , Rfl:    glucose blood (ACCU-CHEK SMARTVIEW) test strip, Check sugar 6 x daily (Patient taking differently: Check sugar 6 x daily. Uses true test instead), Disp: 200 each, Rfl: 3   HUMALOG KWIKPEN 200 UNIT/ML KwikPen, Inject 10-50 Units into the skin See admin instructions. Via Insulin Pump, Disp: , Rfl:    Insulin Disposable Pump (OMNIPOD DASH PODS, GEN 4,) MISC, 200 Units every other day. Humalog, Disp: , Rfl:    Insulin Pen Needle (INSUPEN PEN NEEDLES) 32G X 4 MM MISC, BD Pen Needles- brand specific. Inject insulin via insulin pen daily, Disp: 200 each, Rfl: 3   ivabradine (CORLANOR) 5 MG TABS tablet, Take 1 tablet (5 mg total) by mouth 2 (two) times daily with a meal., Disp: 180 tablet, Rfl: 3   Lancet Devices (ACCU-CHEK SOFTCLIX) lancets, Use as instructed for blood glucose checks four times daily, before meals and at bedtime, Disp: 100 each, Rfl: 5   medroxyPROGESTERone (DEPO-PROVERA) 150 MG/ML injection, Inject 150 mg into the muscle every  3 (three) months., Disp: , Rfl:    methocarbamol (ROBAXIN) 500 MG tablet, Take 1 tablet (500 mg total) by mouth 2 (two) times daily. (Patient taking differently: Take 500 mg by mouth every 8 (eight) hours as needed for muscle spasms.), Disp: 26 tablet, Rfl: 0   montelukast (SINGULAIR) 10 MG tablet, Take 10 mg by mouth at bedtime., Disp: , Rfl:    Multiple Vitamin (MULTI-VITAMIN) tablet, Take 1 tablet by mouth daily., Disp: , Rfl:    nortriptyline (PAMELOR) 10 MG capsule, Take 20 mg by mouth at bedtime., Disp: , Rfl:    NURTEC 75 MG TBDP, Take 75 mg by mouth daily as needed (migraines)., Disp: , Rfl:    ondansetron (ZOFRAN-ODT) 4  MG disintegrating tablet, Take 1 tablet (4 mg total) by mouth every 8 (eight) hours as needed for nausea or vomiting., Disp: 20 tablet, Rfl: 0   pantoprazole (PROTONIX) 40 MG tablet, Take 40 mg by mouth daily., Disp: , Rfl:    rosuvastatin (CRESTOR) 20 MG tablet, Take 1 tablet (20 mg total) by mouth daily at 6 PM., Disp: 30 tablet, Rfl: 0   sacubitril-valsartan (ENTRESTO) 97-103 MG, TAKE 1 TABLET BY MOUTH TWICE DAILY, Disp: 180 tablet, Rfl: 1   spironolactone (ALDACTONE) 25 MG tablet, Take 0.5 tablets (12.5 mg total) by mouth daily., Disp: 45 tablet, Rfl: 3   topiramate (TOPAMAX) 100 MG tablet, Take 100 mg by mouth 2 (two) times daily., Disp: , Rfl:    TRUEPLUS LANCETS 33G MISC, USE TO CHECK BLOOD GLUCOSE QID - AFTER MEALS AND AT BEDTIME, Disp: , Rfl: 3   VENTOLIN HFA 108 (90 BASE) MCG/ACT inhaler, Use as directed 2 puffs in the mouth or throat 4 (four) times daily as needed for shortness of breath. , Disp: , Rfl: 3   Cardiovascular & other pertient studies:  Reviewed external labs and tests, independently interpreted  Remote BiV ICD transmission 08/21/2022: Longevity 3 years and 10 months.  AP 1%, LV 99%.  There were no high rate episodes.  Lead impedance and thresholds are normal.  Thoracic impedance is rising and does not suggest volume overload state.  Normal CRT-D function.  Cardiac MRI 05/03/2022: 1. Moderate LVE with global hypokinesis quantitative EF 34% 2.  Normal RV size and function RVEF 51% 3.  Normal cardiac valves 4.  Trivial LV lateral pericardial effusion 5.  No delayed gadolinium uptake in inversion recovery sequences 6.  Normal parametric measures see above 7.  Estimated cardiac output on 2D flow analysis 4.1 L/min   EKG 03/29/2022: Sinus rhythm 72 bpm  Left atrial enlargement Nonspecific diffuse T wave inversion Compared to previous EKG on 09/20/2021, IVCD now absent. QRS is normal.  Echocardiogram 04/12/2021:  Left ventricle cavity is normal in size. Normal left  ventricular wall  thickness. Severe global hypokinesis. LVEF 25-30%. Indeterminate diastolic  filling pattern.  No significant valvular abnormality.  Normal right atrial pressure.  No significant change compared to previous study on 07/25/2020.   Coronary angiography 03/2021: LM: Normal LAD: Normal         Prox diag 1 30% disease Lcx: Normal RCA: Normal   Mildly decompensated nonischemic cardiomyopathy Continue GDMT for HFrEF  LONG TERM MONITOR (8-14 DAYS) 06/22/20-07/06/20:  Predominant underlying rhythm was sinus with minimum heart rate of 71 bpm, maximum heart rate of 193 bpm average heart rate of 91 bpm.  There were no patient triggered events.  PACs and PVCs were rare.  Patient with intermittent right bundle branch block.  2 episodes  of supraventricular tachycardia with the longest lasting 12 seconds and a maximum heart rate of 93 bpm.  No evidence of atrial fibrillation.  No ventricular tachycardia, high degree AV block, pauses >3 seconds.   TEE 03/2020: Indication: Patient presenting with embolic CVA, TEE being performed to evaluate for cardiac source of cerebral emboli.  She was also found to have severe LV systolic dysfunction by echocardiogram.   LV: Dilated, global hypokinesis, EF 20%.  No mural thrombus. RV: Normal. Left atrium: Mildly dilated.  There is a small PFO with right to left shunting, weakly positive shunting. Left atrial appendage: Normal.  No thrombus.  Normal function. Right atrium: Normal. Mitral valve: Normal.  Trace MR. Tricuspid valve: Normal.  Trace TR. Pulmonary valve: Normal. Aortic valve: Normal.  No aortic regurgitation. Aorta: Normal.  No dissection.   Impression: No obvious cause for cardiac source of cerebral emboli, weakly positive left-to-right shunting by contrast study and also severe LV systolic dysfunction without mural thrombus.  Left atrial appendage is normal and thoracic aorta is normal.    Recent labs: 02/25/2022: Glucose 115, BUN/Cr  14/0.97. EGFR >60. Na/K 143/3.8. Rest of the CMP normal H/H 12/38. MCV 89. Platelets 343  03/2020: Chol 176, TG 106, HDL 29, LDL 126   Latest Reference Range & Units 09/20/21 09:59 12/11/21 17:12 12/11/21 17:30 12/11/21 19:23 12/14/21 09:42  B Natriuretic Peptide 0.0 - 100.0 pg/mL   132.0 (H)    NT-Pro BNP 0 - 130 pg/mL 114    543 (H)  Troponin I (High Sensitivity) <18 ng/L  100 (HH)  88 (H)   (HH): Data is critically high (H): Data is abnormally high    Review of Systems  Cardiovascular:  Positive for leg swelling. Negative for chest pain, dyspnea on exertion, palpitations and syncope.         Vitals:   10/04/22 1413  BP: (!) 156/85  Pulse: 80  SpO2: 100%    Body mass index is 49.81 kg/m. Filed Weights   10/04/22 1413  Weight: 263 lb 9.6 oz (119.6 kg)     Objective:   Physical Exam Vitals and nursing note reviewed.  Constitutional:      General: She is not in acute distress. Neck:     Vascular: No JVD.  Cardiovascular:     Rate and Rhythm: Normal rate and regular rhythm.     Heart sounds: Normal heart sounds. No murmur heard. Pulmonary:     Effort: Pulmonary effort is normal.     Breath sounds: Normal breath sounds. No wheezing or rales.  Musculoskeletal:     Right lower leg: Edema (Trace) present.     Left lower leg: Edema (Trace) present.             Visit diagnoses:   ICD-10-CM   1. Chronic systolic heart failure (HCC)  V40.98 Basic metabolic panel    2. NICM (nonischemic cardiomyopathy) (HCC)  I42.8     3. Essential hypertension  I10 Basic metabolic panel        Assessment & Recommendations:   42 y/o [place] female with hypertension, hyperlipidemia, type 2 DM, obesity, OSA on CPAP, h/o PE in the setting of COVID (2021), nonishcemic cardiomyopathy, embolic stroke (03/9146)  HFrEF: Nonischemic cardiomyopathy, s/p CRT-D On GDMT for HFrEF-coreg-tolerating at 50 mg bid, Entresto 97-103 mg bid, corlanor 7.5 mg bid, lasix 40 mg  daily. Given mild leg edema and uncontrolled blood pressure, increase spironolactone from 12.5 mg daily to 25 mg daily.  Check BMP  in 1 week.  Blood pressure can be checked at upcoming PCP visit in July. Did not tolerate SGLT2i due to yeast infection. Discussed salt restriction.  H/o PE: Continue eliquis   F/u in 6 months      Elder Negus, MD Pager: [phone] Office: [phone]

## 2022-10-21 ENCOUNTER — Encounter (HOSPITAL_COMMUNITY): Payer: Self-pay

## 2022-10-21 ENCOUNTER — Encounter: Payer: Self-pay | Admitting: Emergency Medicine

## 2022-10-21 ENCOUNTER — Emergency Department (HOSPITAL_COMMUNITY)
Admission: EM | Admit: 2022-10-21 | Discharge: 2022-10-21 | Disposition: A | Discharge status: Home / Self Care | Payer: 59 | Attending: Emergency Medicine | Admitting: Emergency Medicine

## 2022-10-21 ENCOUNTER — Emergency Department (HOSPITAL_COMMUNITY): Payer: 59

## 2022-10-21 ENCOUNTER — Other Ambulatory Visit: Payer: Self-pay

## 2022-10-21 ENCOUNTER — Ambulatory Visit
Admission: EM | Admit: 2022-10-21 | Discharge: 2022-10-21 | Disposition: A | Discharge status: Home / Self Care | Payer: 59

## 2022-10-21 DIAGNOSIS — I5020 Unspecified systolic (congestive) heart failure: Secondary | ICD-10-CM | POA: Insufficient documentation

## 2022-10-21 DIAGNOSIS — R600 Localized edema: Secondary | ICD-10-CM | POA: Diagnosis not present

## 2022-10-21 DIAGNOSIS — D72829 Elevated white blood cell count, unspecified: Secondary | ICD-10-CM | POA: Diagnosis not present

## 2022-10-21 DIAGNOSIS — J45909 Unspecified asthma, uncomplicated: Secondary | ICD-10-CM | POA: Insufficient documentation

## 2022-10-21 DIAGNOSIS — R2243 Localized swelling, mass and lump, lower limb, bilateral: Secondary | ICD-10-CM | POA: Diagnosis not present

## 2022-10-21 DIAGNOSIS — Z794 Long term (current) use of insulin: Secondary | ICD-10-CM | POA: Insufficient documentation

## 2022-10-21 DIAGNOSIS — E119 Type 2 diabetes mellitus without complications: Secondary | ICD-10-CM | POA: Insufficient documentation

## 2022-10-21 DIAGNOSIS — Z7901 Long term (current) use of anticoagulants: Secondary | ICD-10-CM | POA: Insufficient documentation

## 2022-10-21 DIAGNOSIS — S80812A Abrasion, left lower leg, initial encounter: Secondary | ICD-10-CM | POA: Diagnosis not present

## 2022-10-21 DIAGNOSIS — M7989 Other specified soft tissue disorders: Secondary | ICD-10-CM | POA: Diagnosis present

## 2022-10-21 DIAGNOSIS — Z79899 Other long term (current) drug therapy: Secondary | ICD-10-CM | POA: Insufficient documentation

## 2022-10-21 DIAGNOSIS — I110 Hypertensive heart disease with heart failure: Secondary | ICD-10-CM | POA: Insufficient documentation

## 2022-10-21 LAB — CBC
HCT: 44 % (ref 36.0–46.0)
Hemoglobin: 13.9 g/dL (ref 12.0–15.0)
MCH: 28.6 pg (ref 26.0–34.0)
MCHC: 31.6 g/dL (ref 30.0–36.0)
MCV: 90.5 fL (ref 80.0–100.0)
Platelets: 232 10*3/uL (ref 150–400)
RBC: 4.86 MIL/uL (ref 3.87–5.11)
RDW: 13.3 % (ref 11.5–15.5)
WBC: 11 10*3/uL — ABNORMAL HIGH (ref 4.0–10.5)
nRBC: 0 % (ref 0.0–0.2)

## 2022-10-21 LAB — BASIC METABOLIC PANEL
Anion gap: 7 (ref 5–15)
BUN: 12 mg/dL (ref 6–20)
CO2: 24 mmol/L (ref 22–32)
Calcium: 8.8 mg/dL — ABNORMAL LOW (ref 8.9–10.3)
Chloride: 109 mmol/L (ref 98–111)
Creatinine, Ser: 1.07 mg/dL — ABNORMAL HIGH (ref 0.44–1.00)
GFR, Estimated: >60 mL/min (ref >60)
Glucose, Bld: 241 mg/dL — ABNORMAL HIGH (ref 70–99)
Potassium: 3.6 mmol/L (ref 3.5–5.1)
Sodium: 140 mmol/L (ref 135–145)

## 2022-10-21 LAB — I-STAT BETA HCG BLOOD, ED (MC, WL, AP ONLY): I-stat hCG, quantitative: <5 m[IU]/mL (ref <5)

## 2022-10-21 LAB — BRAIN NATRIURETIC PEPTIDE: B Natriuretic Peptide: 61.6 pg/mL (ref 0.0–100.0)

## 2022-10-21 MED ORDER — FUROSEMIDE 10 MG/ML IJ SOLN
40.0000 mg | Freq: Once | INTRAMUSCULAR | Status: AC
  Administered 2022-10-21: 40 mg via INTRAVENOUS
  Filled 2022-10-21: qty 4

## 2022-10-21 NOTE — ED Provider Notes (Signed)
[facility]    CSN: [id number] Arrival date & time: 10/21/22  1231      History   Chief Complaint No chief complaint on file.   HPI Shelly Shields is a 42 y.o. female.   Patient presents with chief complaint of bilateral lower extremity swelling that has been present for the past few weeks.  States this is an intermittent issue for her but she has never had itchy feet and this is a new symptom.  She saw her cardiologist on 5/10 who increased her spironolactone dose from 12.5 mg to 25 mg.  She also has 40 mg Lasix to take as needed and she has been taking this with no improvement in leg swelling.  She states that it seems to be worsening.  Also reports that she fell approximately 1 week ago walking through a parking lot and now has an abrasion to her left shin.  Patient is not reporting any fevers.  Patient denies chest discomfort, chest pain, shortness of breath.  Patient also reports that her blood glucose has been elevated over the past 2 days.  It is currently 312 as she has a glucose monitor continuously on her arm.  She states that she has been taking medication as prescribed and has not had any dietary changes. Reports average glucose is 180.  Patient not reporting any nausea, vomiting, dizziness, diarrhea.     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 05/22/2022 05/22/2022   Migraines    Obesity    Pulmonary embolism (HCC) 05/2019   with COVID   Sleep apnea     Patient Active Problem List   Diagnosis Date Noted   Encounter for assessment of implantable cardioverter-defibrillator (ICD) 05/22/2022   ICD  A[employer] Unify Asura Biventricular  implantable cardioverter-defibrillator  (CRT-D) in situ 05/22/2022 05/22/2022   Pacemaker 05/22/2022   NICM (nonischemic cardiomyopathy) (HCC) 04/23/2022   LBBB (left bundle branch block) 04/23/2022   Chronic systolic heart failure (HCC) 03/26/2021   Acute CVA (cerebrovascular accident) (HCC) 03/27/2020   Sleep apnea    Diabetes mellitus without complication (HCC)    COVID-19 long hauler    Obesity    Acute pulmonary embolism (HCC) 06/30/2019   Pulmonary embolism (HCC) 05/2019   Hypernatremia    Asthma, chronic    DKA (diabetic ketoacidoses) 11/27/2014   Diabetes mellitus, new onset (HCC)    Hyperglycemic hyperosmolar nonketotic coma (HCC)    Essential hypertension     Past Surgical History:  Procedure Laterality Date   BILATERAL CARPAL TUNNEL RELEASE     BIV ICD INSERTION CRT-D N/A 05/22/2022   Procedure: BIV ICD INSERTION CRT-D;  Surgeon: Duke Salvia, MD;  Location[facility] INVASIVE CV LAB;  Service: Cardiovascular;  Laterality: N/A;   LEAD REVISION/REPAIR N/A 05/22/2022   Procedure: LEAD REVISION/REPAIR;  Surgeon: Duke Salvia, MD;  Location: [facility] INVASIVE CV LAB;  Service: Cardiovascular;  Laterality: N/A;   LEFT HEART CATH AND CORONARY ANGIOGRAPHY N/A 03/27/2021   Procedure: LEFT HEART CATH AND CORONARY ANGIOGRAPHY;  Surgeon: Elder Negus, MD;  Location: [facility] INVASIVE CV LAB;  Service: Cardiovascular;  Laterality: N/A;   TEE WITHOUT CARDIOVERSION N/A 03/28/2020   Procedure: TRANSESOPHAGEAL ECHOCARDIOGRAM (  TEE);  Surgeon: Yates Decamp, MD;  Location: [facility] ENDOSCOPY;  Service: Cardiovascular;  Laterality: N/A;    OB History   No obstetric history on file.      Home Medications    Prior to Admission medications   Medication Sig Start Date End Date Taking? Authorizing Provider  apixaban (ELIQUIS) 5 MG TABS tablet Take 1 tablet (5 mg total) by mouth 2 (two) times daily. Resume on 11/2 morning 03/27/21   Patwardhan, Anabel Bene, MD  Baclofen 5 MG TABS Take 5-10 mg by mouth  daily as needed (Migraines). 08/09/19   [provider]  Blood Glucose Monitoring Suppl (ACCU-CHEK NANO SMARTVIEW) W/DEVICE KIT 1 Device by Does not apply route 4 (four) times daily - after meals and at bedtime. 11/29/14   Abram Sander, MD  butalbital-acetaminophen-caffeine (FIORICET) [phone] MG tablet Take 1 tablet by mouth every 6 (six) hours as needed for headache. 12/11/21   [provider]  carvedilol (COREG) 25 MG tablet TAKE 2 TABLETS(50 MG) BY MOUTH TWICE DAILY WITH A MEAL 01/14/22   Patwardhan, Manish J, MD  Continuous Blood Gluc Sensor (DEXCOM G6 SENSOR) MISC SMARTSIG:1 Topical Every 10 Days 10/18/20   [provider]  Continuous Blood Gluc Transmit (DEXCOM G6 TRANSMITTER) MISC See admin instructions. 08/16/20   [provider]  Cyanocobalamin (B-12) 1000 MCG SUBL Place 1,000 mcg under the tongue daily.    [provider]  escitalopram (LEXAPRO) 10 MG tablet Take 10 mg by mouth daily. 03/01/20   [provider]  fexofenadine (ALLEGRA) 180 MG tablet Take 180 mg by mouth daily.    [provider]  FLOVENT HFA 110 MCG/ACT inhaler Inhale 2 puffs into the lungs 2 (two) times daily. 04/26/21   [provider]  fluticasone (FLONASE) 50 MCG/ACT nasal spray Place 2 sprays into both nostrils daily as needed for allergies.  09/22/14   [provider]  furosemide (LASIX) 40 MG tablet TAKE 1 TABLET(40 MG) BY MOUTH DAILY AS NEEDED FOR FLUID RETENTION OR SWELLING 04/01/22   Patwardhan, Manish J, MD  gabapentin (NEURONTIN) 300 MG capsule Take 300 mg by mouth 3 (three) times daily. 02/22/22   [provider]  glucose blood (ACCU-CHEK SMARTVIEW) test strip Check sugar 6 x daily Patient taking differently: Check sugar 6 x daily. Uses true test instead 11/29/14   Abram Sander, MD  HUMALOG KWIKPEN 200 UNIT/ML KwikPen Inject 10-50 Units into the skin See admin instructions. Via Insulin Pump 09/06/21   [provider]   Insulin Disposable Pump (OMNIPOD DASH PODS, GEN 4,) MISC 200 Units every other day. Humalog 02/23/21   [provider]  Insulin Pen Needle (INSUPEN PEN NEEDLES) 32G X 4 MM MISC BD Pen Needles- brand specific. Inject insulin via insulin pen daily 11/29/14   Abram Sander, MD  ivabradine (CORLANOR) 5 MG TABS tablet Take 1 tablet (5 mg total) by mouth 2 (two) times daily with a meal. 07/31/22   Yates Decamp, MD  Lancet Devices [facility]) lancets Use as instructed for blood glucose checks four times daily, before meals and at bedtime 11/29/14   Abram Sander, MD  medroxyPROGESTERone (DEPO-PROVERA) 150 MG/ML injection Inject 150 mg into the muscle every 3 (three) months.    [provider]  methocarbamol (ROBAXIN) 500 MG tablet Take 1 tablet (500 mg total) by mouth 2 (two) times daily. Patient not taking: Reported on 10/04/2022 03/30/22   Prosperi, Christian H, PA-C  montelukast (SINGULAIR) 10 MG tablet Take 10  mg by mouth at bedtime.    [provider]  Multiple Vitamin (MULTI-VITAMIN) tablet Take 1 tablet by mouth daily.    [provider]  nortriptyline (PAMELOR) 10 MG capsule Take 20 mg by mouth at bedtime.    [provider]  NURTEC 75 MG TBDP Take 75 mg by mouth daily as needed (migraines). 04/02/21   [provider]  ondansetron (ZOFRAN-ODT) 4 MG disintegrating tablet Take 1 tablet (4 mg total) by mouth every 8 (eight) hours as needed for nausea or vomiting. 09/09/21   Karie Mainland, Amjad, PA-C  pantoprazole (PROTONIX) 40 MG tablet Take 40 mg by mouth daily.    [provider]  rosuvastatin (CRESTOR) 20 MG tablet Take 1 tablet (20 mg total) by mouth daily at 6 PM. 03/30/20   Elgergawy, Leana Roe, MD  sacubitril-valsartan (ENTRESTO) 97-103 MG TAKE 1 TABLET BY MOUTH TWICE DAILY 04/29/22   Patwardhan, Anabel Bene, MD  spironolactone (ALDACTONE) 25 MG tablet Take 1 tablet (25 mg total) by mouth daily. 10/04/22   Patwardhan, Anabel Bene, MD  topiramate  (TOPAMAX) 100 MG tablet Take 100 mg by mouth 2 (two) times daily.    [provider]  TRUEPLUS LANCETS 33G MISC USE TO CHECK BLOOD GLUCOSE QID - AFTER MEALS AND AT BEDTIME 12/22/14   [provider]  VENTOLIN HFA 108 (90 BASE) MCG/ACT inhaler Use as directed 2 puffs in the mouth or throat 4 (four) times daily as needed for shortness of breath.  12/21/14   [provider]    Family History Family History  Problem Relation Age of Onset   Hypertension Mother    Thyroid disease Mother    Hypertension Father    Stroke Father 68   Diabetes Father    Sleep apnea Father    Colon cancer Maternal Uncle 60   Aneurysm Paternal Aunt 58   CVA Paternal Uncle 42   Cancer - Lung Maternal Grandmother    Heart failure Paternal Grandfather     Social History Social History   Tobacco Use   Smoking status: Never   Smokeless tobacco: Never  Vaping Use   Vaping Use: Never used  Substance Use Topics   Alcohol use: Yes    Alcohol/week: 0.0 standard drinks of alcohol    Comment: occasional    Drug use: No     Allergies   Farxiga [dapagliflozin], Ibuprofen, Ketorolac, Lisinopril, and Metformin   Review of Systems Review of Systems Per HPI  Physical Exam Triage Vital Signs ED Triage Vitals  Enc Vitals Group     BP 10/21/22 1329 (!) 141/90     Pulse Rate 10/21/22 1329 88     Resp 10/21/22 1329 20     Temp 10/21/22 1329 98 F (36.7 C)     Temp Source 10/21/22 1329 Oral     SpO2 10/21/22 1329 96 %     Weight --      Height --      Head Circumference --      Peak Flow --      Pain Score 10/21/22 1341 4     Pain Loc --      Pain Edu? --      Excl. in GC? --    No data found.  Updated Vital Signs BP (!) 141/90 (BP Location: Right Arm)   Pulse 88   Temp 98 F (36.7 C) (Oral)   Resp (!) 24   SpO2 96%   Visual Acuity Right Eye Distance:  Left Eye Distance:   Bilateral Distance:    Right Eye Near:   Left Eye Near:    Bilateral Near:     Physical  Exam Constitutional:      General: She is not in acute distress.    Appearance: Normal appearance. She is not toxic-appearing or diaphoretic.  HENT:     Head: Normocephalic and atraumatic.  Eyes:     Extraocular Movements: Extraocular movements intact.     Conjunctiva/sclera: Conjunctivae normal.  Cardiovascular:     Rate and Rhythm: Normal rate and regular rhythm.     Pulses: Normal pulses.     Heart sounds: Normal heart sounds.  Pulmonary:     Effort: No respiratory distress.     Breath sounds: Normal breath sounds. No stridor. No wheezing, rhonchi or rales.     Comments: Appears mildly tachypneic on exam but lung sounds are normal to auscultation. Skin:    Comments: Has nonpitting edema throughout bilateral lower legs that seems to extend up to directly below the knees.  She has a scar from an old ulcer that is healed over present to right shin.  She has a new abrasion present directly adjacent to old scar that looks very similar present on the left shin.  No bleeding noted.  Pulses and capillary refill are normal.  Patient can wiggle toes.  Neurological:     General: No focal deficit present.     Mental Status: She is alert and oriented to person, place, and time. Mental status is at baseline.  Psychiatric:        Mood and Affect: Mood normal.        Behavior: Behavior normal.        Thought Content: Thought content normal.        Judgment: Judgment normal.      UC Treatments / Results  Labs (all labs ordered are listed, but only abnormal results are displayed) Labs Reviewed - No data to display  EKG   Radiology No results found.  Procedures Procedures (including critical care time)  Medications Ordered in UC Medications - No data to display  Initial Impression / Assessment and Plan / UC Course  I have reviewed the triage vital signs and the nursing notes.  Pertinent labs & imaging results that were available during my care of the patient were reviewed by me and  considered in my medical decision making (see chart for details).     I am concerned the patient needs more extensive evaluation and management with chest x-ray and stat labs given patient appears mildly tachypneic on exam with history of heart failure and current bilateral lower extremity swelling.  Her blood glucose is also significantly elevated which is concerning.  Therefore, patient was advised to go to the emergency department for further evaluation and management and was agreeable with plan.  Vital signs stable at discharge.  Agree with patient self transport to the ER. Final Clinical Impressions(s) / UC Diagnoses   Final diagnoses:  Localized swelling, mass, or lump of lower extremity, bilateral  Abrasion of left lower extremity, initial encounter     Discharge Instructions      Go to the emergency department as soon as you leave urgent care for further evaluation and management.    ED Prescriptions   None    PDMP not reviewed this encounter.   Gustavus Bryant, [place] 10/21/22 1416

## 2022-10-21 NOTE — ED Triage Notes (Signed)
Patient has hx of HF and has leg swelling and itching to legs.  Patient has small open wound to left lower leg.  Denies increasing sob.  Always sob per patient.

## 2022-10-21 NOTE — Discharge Instructions (Signed)
Go to the emergency department as soon as you leave urgent care for further evaluation and management. 

## 2022-10-21 NOTE — ED Notes (Signed)
Denies sob or any chest pain

## 2022-10-21 NOTE — ED Provider Notes (Signed)
[facility] EMERGENCY DEPARTMENT AT [facility] Provider Note   CSN: [id number] Arrival date & time: 10/21/22  1538     History  Chief Complaint  Patient presents with   Leg Swelling    Shelly Shields is a 42 y.o. female.  HPI 42 year old female with history of systolic heart failure, asthma, diabetes, hypertension, prior PE on Eliquis presenting for leg swelling.  Patient states for the last few days she has had worsening bilateral symmetric lower extremity edema.  She went to urgent care today and they referred her here.  She takes spironolactone and Lasix at home.  She has not missed these over the last few days and has not had significant improvement.  She takes 40 mg of Lasix daily.  She has chronic shortness of breath and states no new shortness of breath or orthopnea.  She is not had any chest pain or fever or cough.  She has a small wound to her left leg which is not getting worse.  She is still urinating without difficulty.  No change in her diet.  She has a history of prior PE.  She takes Eliquis has not missed any recently.  She does not have any chest pain including no pleuritic chest pain.  She has never had a DVT as far she knows before.     Home Medications Prior to Admission medications   Medication Sig Start Date End Date Taking? Authorizing Provider  apixaban (ELIQUIS) 5 MG TABS tablet Take 1 tablet (5 mg total) by mouth 2 (two) times daily. Resume on 11/2 morning 03/27/21   Patwardhan, Anabel Bene, MD  Baclofen 5 MG TABS Take 5-10 mg by mouth daily as needed (Migraines). 08/09/19   [provider]  Blood Glucose Monitoring Suppl (ACCU-CHEK NANO SMARTVIEW) W/DEVICE KIT 1 Device by Does not apply route 4 (four) times daily - after meals and at bedtime. 11/29/14   Abram Sander, MD  butalbital-acetaminophen-caffeine (FIORICET) [phone] MG tablet Take 1 tablet by mouth every 6 (six) hours as needed for headache. 12/11/21   [provider]  carvedilol  (COREG) 25 MG tablet TAKE 2 TABLETS(50 MG) BY MOUTH TWICE DAILY WITH A MEAL 01/14/22   Patwardhan, Manish J, MD  Continuous Blood Gluc Sensor (DEXCOM G6 SENSOR) MISC SMARTSIG:1 Topical Every 10 Days 10/18/20   [provider]  Continuous Blood Gluc Transmit (DEXCOM G6 TRANSMITTER) MISC See admin instructions. 08/16/20   [provider]  Cyanocobalamin (B-12) 1000 MCG SUBL Place 1,000 mcg under the tongue daily.    [provider]  escitalopram (LEXAPRO) 10 MG tablet Take 10 mg by mouth daily. 03/01/20   [provider]  fexofenadine (ALLEGRA) 180 MG tablet Take 180 mg by mouth daily.    [provider]  FLOVENT HFA 110 MCG/ACT inhaler Inhale 2 puffs into the lungs 2 (two) times daily. 04/26/21   [provider]  fluticasone (FLONASE) 50 MCG/ACT nasal spray Place 2 sprays into both nostrils daily as needed for allergies.  09/22/14   [provider]  furosemide (LASIX) 40 MG tablet TAKE 1 TABLET(40 MG) BY MOUTH DAILY AS NEEDED FOR FLUID RETENTION OR SWELLING 04/01/22   Patwardhan, Manish J, MD  gabapentin (NEURONTIN) 300 MG capsule Take 300 mg by mouth 3 (three) times daily. 02/22/22   [provider]  glucose blood (ACCU-CHEK SMARTVIEW) test strip Check sugar 6 x daily Patient taking differently: Check sugar 6 x daily. Uses true test instead 11/29/14   Adamo,  Jeris Penta, MD  HUMALOG KWIKPEN 200 UNIT/ML KwikPen Inject 10-50 Units into the skin See admin instructions. Via Insulin Pump 09/06/21   [provider]  Insulin Disposable Pump (OMNIPOD DASH PODS, GEN 4,) MISC 200 Units every other day. Humalog 02/23/21   [provider]  Insulin Pen Needle (INSUPEN PEN NEEDLES) 32G X 4 MM MISC BD Pen Needles- brand specific. Inject insulin via insulin pen daily 11/29/14   Abram Sander, MD  ivabradine (CORLANOR) 5 MG TABS tablet Take 1 tablet (5 mg total) by mouth 2 (two) times daily with a meal. 07/31/22   Yates Decamp, MD  Lancet Devices  [facility]) lancets Use as instructed for blood glucose checks four times daily, before meals and at bedtime 11/29/14   Abram Sander, MD  medroxyPROGESTERone (DEPO-PROVERA) 150 MG/ML injection Inject 150 mg into the muscle every 3 (three) months.    [provider]  methocarbamol (ROBAXIN) 500 MG tablet Take 1 tablet (500 mg total) by mouth 2 (two) times daily. Patient not taking: Reported on 10/04/2022 03/30/22   Prosperi, Christian H, PA-C  montelukast (SINGULAIR) 10 MG tablet Take 10 mg by mouth at bedtime.    [provider]  Multiple Vitamin (MULTI-VITAMIN) tablet Take 1 tablet by mouth daily.    [provider]  nortriptyline (PAMELOR) 10 MG capsule Take 20 mg by mouth at bedtime.    [provider]  NURTEC 75 MG TBDP Take 75 mg by mouth daily as needed (migraines). 04/02/21   [provider]  ondansetron (ZOFRAN-ODT) 4 MG disintegrating tablet Take 1 tablet (4 mg total) by mouth every 8 (eight) hours as needed for nausea or vomiting. 09/09/21   Karie Mainland, Amjad, PA-C  pantoprazole (PROTONIX) 40 MG tablet Take 40 mg by mouth daily.    [provider]  rosuvastatin (CRESTOR) 20 MG tablet Take 1 tablet (20 mg total) by mouth daily at 6 PM. 03/30/20   Elgergawy, Leana Roe, MD  sacubitril-valsartan (ENTRESTO) 97-103 MG TAKE 1 TABLET BY MOUTH TWICE DAILY 04/29/22   Patwardhan, Anabel Bene, MD  spironolactone (ALDACTONE) 25 MG tablet Take 1 tablet (25 mg total) by mouth daily. 10/04/22   Patwardhan, Anabel Bene, MD  topiramate (TOPAMAX) 100 MG tablet Take 100 mg by mouth 2 (two) times daily.    [provider]  TRUEPLUS LANCETS 33G MISC USE TO CHECK BLOOD GLUCOSE QID - AFTER MEALS AND AT BEDTIME 12/22/14   [provider]  VENTOLIN HFA 108 (90 BASE) MCG/ACT inhaler Use as directed 2 puffs in the mouth or throat 4 (four) times daily as needed for shortness of breath.  12/21/14   [provider]      Allergies    Farxiga  [dapagliflozin], Ibuprofen, Ketorolac, Lisinopril, and Metformin    Review of Systems   Review of Systems  Cardiovascular:  Positive for leg swelling.  All other systems reviewed and are negative.   Physical Exam Updated Vital Signs BP (!) 152/76 (BP Location: Right Arm)   Pulse 87   Temp 97.8 F (36.6 C) (Oral)   Resp 18   Ht 5\' 1"  (1.549 m)   Wt 119.3 kg   SpO2 100%   BMI 49.69 kg/m  Physical Exam Vitals and nursing note reviewed.  Constitutional:      General: She is not in acute distress.    Appearance: She is well-developed.  HENT:     Head: Normocephalic and atraumatic.     Nose: Nose normal.  Mouth/Throat:     Mouth: Mucous membranes are moist.     Pharynx: Oropharynx is clear.  Eyes:     Conjunctiva/sclera: Conjunctivae normal.  Cardiovascular:     Rate and Rhythm: Normal rate and regular rhythm.     Heart sounds: No murmur heard. Pulmonary:     Effort: Pulmonary effort is normal. No respiratory distress.     Breath sounds: Normal breath sounds.  Abdominal:     Palpations: Abdomen is soft.     Tenderness: There is no abdominal tenderness.  Musculoskeletal:        General: No swelling.     Cervical back: Normal range of motion and neck supple.     Right lower leg: Edema present.     Left lower leg: Edema present.     Comments: Symmetric bilateral lower extremity edema.  Small wound to anterior left calf.  No induration, fluctuance, erythema.  No purulence.  Skin:    General: Skin is warm and dry.     Capillary Refill: Capillary refill takes less than 2 seconds.  Neurological:     Mental Status: She is alert.  Psychiatric:        Mood and Affect: Mood normal.     ED Results / Procedures / Treatments   Labs (all labs ordered are listed, but only abnormal results are displayed) Labs Reviewed  BASIC METABOLIC PANEL - Abnormal; Notable for the following components:      Result Value   Glucose, Bld 241 (*)    Creatinine, Ser 1.07 (*)    Calcium  8.8 (*)    All other components within normal limits  CBC - Abnormal; Notable for the following components:   WBC 11.0 (*)    All other components within normal limits  BRAIN NATRIURETIC PEPTIDE  I-STAT BETA HCG BLOOD, ED ([facility], [facility], [facility] ONLY)    EKG EKG Interpretation  Date/Time:  Monday Oct 21 2022 15:35:44 EDT Ventricular Rate:  93 PR Interval:  140 QRS Duration: 134 QT Interval:  422 QTC Calculation: 524 R Axis:   74 Text Interpretation: Atrial-sensed ventricular-paced rhythm Abnormal ECG Confirmed by Vonita Moss [phone]) on 10/21/2022 7:03:48 PM  Radiology DG Chest 2 View  Result Date: 10/21/2022 CLINICAL DATA:  Bilateral lower extremity edema. Chronic shortness of breath. Heart failure. EXAM: CHEST - 2 VIEW COMPARISON:  Chest radiographs 05/22/2022 (multiple studies), 12/11/2021 FINDINGS: Left chest wall cardiac AICD with leads unchanged in position. Cardiac silhouette is moderately enlarged. Mediastinal contours are within normal limits. Moderate atherosclerotic calcifications within the aortic arch. The lungs are clear. No pulmonary edema, pleural effusion, or pneumothorax. Mild multilevel degenerative disc changes of the thoracic spine. IMPRESSION: 1. No acute cardiopulmonary process. 2. Moderate cardiomegaly, unchanged. Electronically Signed   By: Neita Garnet M.D.   On: 10/21/2022 16:39    Procedures Procedures    Medications Ordered in ED Medications  furosemide (LASIX) injection 40 mg (40 mg Intravenous Given 10/21/22 1847)    ED Course/ Medical Decision Making/ A&P                             Medical Decision Making Amount and/or Complexity of Data Reviewed Labs: ordered. Radiology: ordered.  Risk Prescription drug management.   42 year old female with history of heart failure presenting for worsening lower extremity edema.  Vital signs notable for hypertension.  On exam she is well-appearing.  She is some chronic shortness of breath but no new  shortness  of breath, hypoxia, or tachypnea.  I reviewed her chest x-ray, she has no signs of pulmonary edema and has stable cardiomegaly.  No sign signs of pneumonia or other acute abnormality.  EKG atrial sensed ventricular paced with left bundle branch morphology.  I do not see any signs of acute ischemia.  Her QRS does seem wider compared to prior.  She is adherent to her Eliquis regimen, and edema is symmetric lower concern for DVT or PE.  Lab work reviewed, CBC notable for mild leukocytosis.  No infectious symptoms.  She is not pregnant.  Her BMP is similar to prior and improved from most recent.  BMP with baseline renal function, mild hyperglycemia but no DKA.  I suspect her symptoms are related to heart failure exacerbation.  However given she is not short of breath, has no hypoxia, tachypnea, normal work of breathing and no pulmonary edema I do not think she needs hospitalized at this time.  I gave her a dose of IV Lasix here and will have her increase the frequency of her Lasix at home.  Will also have her call her cardiologist tomorrow to schedule close follow-up.  She was discharged with strict return precautions.        Final Clinical Impression(s) / ED Diagnoses Final diagnoses:  Leg edema    Rx / DC Orders ED Discharge Orders     None         Fulton Reek, MD 10/21/22 2340    Rondel Baton, MD 10/23/22 1218

## 2022-10-21 NOTE — ED Triage Notes (Signed)
patient has swollen feet that is a ongoing issue.  Reports the swelling is worse.  Reports itchiness to feet is new.  Patient fell about a week ago.  Walking through an uneven concrete lot and fell on rocks.    Patient has a wound on left lower leg that occurred after the fall.  Patient has a similar scar on right lower leg from a prior event.  -this wound required wound care clinic.    Patient reports cbg 318 and equipment instructed patient to administer insulin.  Patient has given insulin as instructed by provider governing diabetes

## 2022-10-21 NOTE — Discharge Instructions (Signed)
You were seen today for leg swelling.  Your chest x-ray and lab work were reassuring.  The swelling is likely due to your heart failure.  Recommend increasing her Lasix to 40 mg twice daily for the next 3 days.  You should call your cardiologist tomorrow morning to schedule close follow-up.  You should also follow-up with your primary care provider within the next week.  If you develop difficulty breathing, chest pain, or worsening swelling you should return to the ED.

## 2022-10-24 ENCOUNTER — Encounter: Payer: Self-pay | Admitting: Cardiology

## 2022-10-24 ENCOUNTER — Ambulatory Visit: Payer: 59 | Admitting: Cardiology

## 2022-10-24 VITALS — BP 132/72 | HR 72 | Resp 16 | Ht 61.0 in | Wt 269.0 lb

## 2022-10-24 DIAGNOSIS — Z95810 Presence of automatic (implantable) cardiac defibrillator: Secondary | ICD-10-CM

## 2022-10-24 DIAGNOSIS — R600 Localized edema: Secondary | ICD-10-CM

## 2022-10-24 DIAGNOSIS — L299 Pruritus, unspecified: Secondary | ICD-10-CM

## 2022-10-24 DIAGNOSIS — I428 Other cardiomyopathies: Secondary | ICD-10-CM

## 2022-10-24 DIAGNOSIS — I5023 Acute on chronic systolic (congestive) heart failure: Secondary | ICD-10-CM

## 2022-10-24 MED ORDER — TORSEMIDE 20 MG PO TABS
20.0000 mg | ORAL_TABLET | ORAL | 3 refills | Status: DC
Start: 2022-10-24 — End: 2022-12-12

## 2022-10-24 MED ORDER — HYDROXYZINE HCL 10 MG PO TABS
10.0000 mg | ORAL_TABLET | Freq: Three times a day (TID) | ORAL | 0 refills | Status: AC | PRN
Start: 2022-10-24 — End: ?

## 2022-10-24 NOTE — Progress Notes (Signed)
Primary Physician/Referring:  Everlean Cherry, MD  Patient ID: Shelly Shields, female    DOB: [birth year], 42 y.o.   MRN: [id number]  Chief Complaint  Patient presents with   NICM (nonischemic cardiomyopathy)   Hospitalization Follow-up   HPI:    Shelly Shields  is a 42 y.o. African-American female with hypertension, hyperlipidemia, uncontrolled diabetes mellitus, obesity and obstructive sleep apnea on CPAP and history of Covid pneumonia in January 2021 during which time she had pulmonary embolism.   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  She has been on anticoagulation chronically with Eliquis.  Hypercoagulable workup was negative.  She underwent BiV ICD implantation on 05/22/2022.  This is an acute visit, she was seen in the emergency room 3 days ago for worsening leg edema and dyspnea.  Patient states that she is gradually noticing worsening heart failure symptoms again.  On further questioning, she has discontinued Ozempic as it made her nauseous and did not want to continue using it.  She has gained weight as well.  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 05/22/2022 05/22/2022   Migraines    Obesity    Pulmonary embolism (HCC) 05/2019   with COVID   Sleep apnea    Past Surgical History:  Procedure Laterality Date   BILATERAL CARPAL TUNNEL RELEASE     BIV ICD INSERTION CRT-D N/A 05/22/2022   Procedure: BIV ICD INSERTION CRT-D;  Surgeon: Duke Salvia, MD;  Location: [facility] INVASIVE CV LAB;  Service: Cardiovascular;  Laterality: N/A;   LEAD REVISION/REPAIR  N/A 05/22/2022   Procedure: LEAD REVISION/REPAIR;  Surgeon: Duke Salvia, MD;  Location: [facility] INVASIVE CV LAB;  Service: Cardiovascular;  Laterality: N/A;   LEFT HEART CATH AND CORONARY ANGIOGRAPHY N/A 03/27/2021   Procedure: LEFT HEART CATH AND CORONARY ANGIOGRAPHY;  Surgeon: Elder Negus, MD;  Location: [facility] INVASIVE CV LAB;  Service: Cardiovascular;  Laterality: N/A;   TEE WITHOUT CARDIOVERSION N/A 03/28/2020   Procedure: TRANSESOPHAGEAL ECHOCARDIOGRAM (TEE);  Surgeon: Yates Decamp, MD;  Location: [facility] ENDOSCOPY;  Service: Cardiovascular;  Laterality: N/A;   Family History  Problem Relation Age of Onset   Hypertension Mother    Thyroid disease Mother    Hypertension Father    Stroke Father 40   Diabetes Father    Sleep apnea Father    Colon cancer Maternal Uncle 60   Aneurysm Paternal Aunt 37   CVA Paternal Uncle 89   Cancer - Lung Maternal Grandmother    Heart failure Paternal Grandfather     Social History   Tobacco Use   Smoking status: Never   Smokeless tobacco: Never  Substance Use Topics   Alcohol use: Yes    Alcohol/week: 0.0 standard drinks of alcohol    Comment: occasional    Marital Status: Single   ROS  Review of Systems  Cardiovascular:  Positive for dyspnea on exertion and leg swelling. Negative for chest pain.   Objective  Blood pressure 132/72, pulse 72, resp. rate 16, height 5\' 1"  (1.549 m), weight 269 lb (122 kg), SpO2 96 %.     10/24/2022  3:29 PM 10/21/2022    7:15 PM 10/21/2022    6:30 PM  Vitals with BMI  Height 5\' 1"     Weight 269 lbs    BMI 50.85    Systolic 132 152 161  Diastolic 72 76 90  Pulse 72 87 83      Physical Exam Constitutional:      Appearance: She is morbidly obese.  Neck:     Vascular: JVD present. No carotid bruit.  Cardiovascular:     Rate and Rhythm: Normal rate and regular rhythm.     Pulses: Intact distal pulses.     Heart sounds: Normal heart sounds. No murmur heard.    No gallop.  Pulmonary:     Effort:  Pulmonary effort is normal.     Breath sounds: Normal breath sounds.  Abdominal:     General: Bowel sounds are normal.     Palpations: Abdomen is soft.  Musculoskeletal:     Right lower leg: Edema (2 + pitting) present.     Left lower leg: Edema (2+ pitinh) present.    Laboratory examination:   Recent Labs    12/11/21 1712 12/14/21 0942 02/25/22 1544 05/16/22 0922 10/21/22 1551  NA 138   < > 143 143 140  K 3.9   < > 3.8 4.0 3.6  CL 109   < > 110 109* 109  CO2 21*   < > 27 21 24   GLUCOSE 152*   < > 115* 81 241*  BUN 13   < > 14 16 12   CREATININE 1.35*   < > 0.97 1.04* 1.07*  CALCIUM 8.8*   < > 9.0 9.1 8.8*  GFRNONAA 51*  --  >60  --  >60   < > = values in this interval not displayed.   estimated creatinine clearance is 83.8 mL/min (A) (by C-G formula based on SCr of 1.07 mg/dL (H)).     Latest Ref Rng & Units 10/21/2022    3:51 PM 05/16/2022    9:22 AM 02/25/2022    3:44 PM  CMP  Glucose 70 - 99 mg/dL 096  81  045   BUN 6 - 20 mg/dL 12  16  14    Creatinine 0.44 - 1.00 mg/dL 4.09  8.11  9.14   Sodium 135 - 145 mmol/L 140  143  143   Potassium 3.5 - 5.1 mmol/L 3.6  4.0  3.8   Chloride 98 - 111 mmol/L 109  109  110   CO2 22 - 32 mmol/L 24  21  27    Calcium 8.9 - 10.3 mg/dL 8.8  9.1  9.0   Total Protein 6.5 - 8.1 g/dL   6.6   Total Bilirubin 0.3 - 1.2 mg/dL   0.5   Alkaline Phos 38 - 126 U/L   78   AST 15 - 41 U/L   15   ALT 0 - 44 U/L   21       Latest Ref Rng & Units 10/21/2022    3:51 PM 05/16/2022    9:22 AM 02/25/2022    3:44 PM  CBC  WBC 4.0 - 10.5 K/uL 11.0  8.5  10.1   Hemoglobin 12.0 - 15.0 g/dL 78.2  95.6  21.3   Hematocrit 36.0 - 46.0 % 44.0  43.0  38.7   Platelets 150 - 400 K/uL 232  259  343    Lab Results  Component Value Date   CHOL 176 03/28/2020   HDL 29 (  L) 03/28/2020   LDLCALC 126 (H) 03/28/2020   TRIG 106 03/28/2020   CHOLHDL 6.1 03/28/2020    HEMOGLOBIN A1C Lab Results  Component Value Date   HGBA1C 11.2 (H) 03/27/2020   MPG 274.74  03/27/2020   Lab Results  Component Value Date   TSH 3.896 03/28/2020    BNP    Component Value Date/Time   BNP 61.6 10/21/2022 1551   ProBNP    Component Value Date/Time   PROBNP 543 (H) 12/14/2021 0942   External labs:   Labs 08/22/2022:  A1c 7.6%.  TSH normal at 2.305.  Total cholesterol 147, triglycerides 72, HDL 47, LDL 84.  Non-HDL cholesterol 100.  Radiology:   Chest x-ray two-view 10/21/2022: Left chest wall cardiac AICD with leads unchanged in position. Cardiac silhouette is moderately enlarged.  Mediastinal contours are within normal limits.  Moderate atherosclerotic calcifications within the aortic arch. The lungs are clear. No pulmonary edema, pleural effusion, or pneumothorax.  Mild multilevel degenerative disc changes of the thoracic spine.  Cardiac Studies:   PCV MYOCARDIAL PERFUSION WITH LEXISCAN 08/14/2020 Lexiscan nuclear stress test performed using 1-day protocol. Left ventricle is dilated. Severe decrease in global myocardial thickening and wall motion. Stress LVEF <20%. Large sized, medium intensity, fixed perfusion defect in anteroapical, apical, inferior, inferoseptal myocardium. High risk study   LONG TERM MONITOR (8-14 DAYS) 06/22/20-07/06/20:  Predominant underlying rhythm was sinus with minimum heart rate of 71 bpm, maximum heart rate of 193 bpm average heart rate of 91 bpm.  There were no patient triggered events.  PACs and PVCs were rare.  Patient with intermittent right bundle branch block.  2 episodes of supraventricular tachycardia with the longest lasting 12 seconds and a maximum heart rate of 93 bpm.  No evidence of atrial fibrillation.  No ventricular tachycardia, high degree AV block, pauses >3 seconds.  Left heart catheterization 03/27/2021:  LM: Normal LAD: Normal         Prox diag 1 30% disease Lcx: Normal RCA: Normal   Mildly decompensated nonischemic cardiomyopathy  PCV ECHOCARDIOGRAM COMPLETE 04/12/2021 Left ventricle cavity  is normal in size. Normal left ventricular wall thickness. Severe global hypokinesis. LVEF 25-30%. Indeterminate diastolic filling pattern. No significant valvular abnormality. Normal right atrial pressure. No significant change compared to previous study on 07/25/2020.  Cardiac MR 05/03/2022: Moderate left ventricular enlargement and global hypokinesis, EF 34%.  Estimated cardiac output 4.4 L/min. Normal RV size and function, RVEF 51%. Normal cardiac valves. Trivial pericardial effusion. No late gadolinium uptake.  ICD Abbott Unify Asura Biventricular implantable cardioverter-defibrillator 07/23/2021   Remote BiV ICD transmission 08/21/2022: Longevity 3 years and 10 months.  AP 1%, LV 99%.  There were no high rate episodes.  Lead impedance and thresholds are normal.  Thoracic impedance is rising and does not suggest volume overload state.  Normal CRT-D function.  EKG:   EKG 10/21/2022: Atrially sensed, BiV paced rhythm at rate of 93 bpm.  EKG 05/23/2022:  Atrially sensed  And biventricularly paced  Rhythm at the rate of 71 bpm.  No further analysis. Compared to 09/20/2021,  Wide QRS rhythm  QRS widening with  LBBB pattern no longer present.   Allergies   Allergies  Allergen Reactions   Farxiga [Dapagliflozin] Other (See Comments)    Recurrent yeast infections   Ibuprofen Hives and Rash    Other Reaction(s): Other (See Comments)  asthma    Asthma asthma    Asthma Asthma asthma asthma    Asthma   Ketorolac Hives  and Rash    Causes asthma attack    Causes asthma attack Causes asthma attack   Lisinopril Cough    Other Reaction(s): Cough, Other (See Comments)   Metformin Diarrhea    Current Outpatient Medications:    apixaban (ELIQUIS) 5 MG TABS tablet, Take 1 tablet (5 mg total) by mouth 2 (two) times daily. Resume on 11/2 morning, Disp: 60 tablet, Rfl: 0   Baclofen 5 MG TABS, Take 5-10 mg by mouth daily as needed (Migraines)., Disp: , Rfl:    Blood Glucose Monitoring Suppl  (ACCU-CHEK NANO SMARTVIEW) W/DEVICE KIT, 1 Device by Does not apply route 4 (four) times daily - after meals and at bedtime., Disp: 1 kit, Rfl: 0   butalbital-acetaminophen-caffeine (FIORICET) 50-325-40 MG tablet, Take 1 tablet by mouth every 6 (six) hours as needed for headache., Disp: , Rfl:    carvedilol (COREG) 25 MG tablet, TAKE 2 TABLETS(50 MG) BY MOUTH TWICE DAILY WITH A MEAL, Disp: 180 tablet, Rfl: 3   Continuous Blood Gluc Sensor (DEXCOM G6 SENSOR) MISC, SMARTSIG:1 Topical Every 10 Days, Disp: , Rfl:    Continuous Blood Gluc Transmit (DEXCOM G6 TRANSMITTER) MISC, See admin instructions., Disp: , Rfl:    Cyanocobalamin (B-12) 1000 MCG SUBL, Place 1,000 mcg under the tongue daily., Disp: , Rfl:    escitalopram (LEXAPRO) 10 MG tablet, Take 10 mg by mouth daily., Disp: , Rfl:    fexofenadine (ALLEGRA) 180 MG tablet, Take 180 mg by mouth daily., Disp: , Rfl:    FLOVENT HFA 110 MCG/ACT inhaler, Inhale 2 puffs into the lungs 2 (two) times daily., Disp: , Rfl:    fluticasone (FLONASE) 50 MCG/ACT nasal spray, Place 2 sprays into both nostrils daily as needed for allergies. , Disp: , Rfl: 3   gabapentin (NEURONTIN) 300 MG capsule, Take 300 mg by mouth 4 (four) times daily., Disp: , Rfl:    glucose blood (ACCU-CHEK SMARTVIEW) test strip, Check sugar 6 x daily (Patient taking differently: Check sugar 6 x daily. Uses true test instead), Disp: 200 each, Rfl: 3   glycopyrrolate (ROBINUL) 1 MG tablet, Take 1 mg by mouth 3 (three) times daily as needed., Disp: , Rfl:    HUMALOG KWIKPEN 200 UNIT/ML KwikPen, Inject 10-50 Units into the skin See admin instructions. Via Insulin Pump, Disp: , Rfl:    hydrOXYzine (ATARAX) 10 MG tablet, Take 1 tablet (10 mg total) by mouth 3 (three) times daily as needed., Disp: 30 tablet, Rfl: 0   Insulin Disposable Pump (OMNIPOD DASH PODS, GEN 4,) MISC, 200 Units every other day. Humalog, Disp: , Rfl:    Insulin Pen Needle (INSUPEN PEN NEEDLES) 32G X 4 MM MISC, BD Pen Needles-  brand specific. Inject insulin via insulin pen daily, Disp: 200 each, Rfl: 3   ivabradine (CORLANOR) 5 MG TABS tablet, Take 1 tablet (5 mg total) by mouth 2 (two) times daily with a meal., Disp: 180 tablet, Rfl: 3   Lancet Devices (ACCU-CHEK SOFTCLIX) lancets, Use as instructed for blood glucose checks four times daily, before meals and at bedtime, Disp: 100 each, Rfl: 5   medroxyPROGESTERone (DEPO-PROVERA) 150 MG/ML injection, Inject 150 mg into the muscle every 3 (three) months., Disp: , Rfl:    montelukast (SINGULAIR) 10 MG tablet, Take 10 mg by mouth at bedtime., Disp: , Rfl:    Multiple Vitamin (MULTI-VITAMIN) tablet, Take 1 tablet by mouth daily., Disp: , Rfl:    nortriptyline (PAMELOR) 10 MG capsule, Take 20 mg by mouth at bedtime., Disp: ,  Rfl:    NURTEC 75 MG TBDP, Take 75 mg by mouth daily as needed (migraines)., Disp: , Rfl:    ondansetron (ZOFRAN-ODT) 4 MG disintegrating tablet, Take 1 tablet (4 mg total) by mouth every 8 (eight) hours as needed for nausea or vomiting., Disp: 20 tablet, Rfl: 0   pantoprazole (PROTONIX) 40 MG tablet, Take 40 mg by mouth daily., Disp: , Rfl:    rosuvastatin (CRESTOR) 20 MG tablet, Take 1 tablet (20 mg total) by mouth daily at 6 PM., Disp: 30 tablet, Rfl: 0   sacubitril-valsartan (ENTRESTO) 97-103 MG, TAKE 1 TABLET BY MOUTH TWICE DAILY, Disp: 180 tablet, Rfl: 1   spironolactone (ALDACTONE) 25 MG tablet, Take 1 tablet (25 mg total) by mouth daily., Disp: 90 tablet, Rfl: 3   topiramate (TOPAMAX) 100 MG tablet, Take 100 mg by mouth 2 (two) times daily., Disp: , Rfl:    torsemide (DEMADEX) 20 MG tablet, Take 1 tablet (20 mg total) by mouth as directed. One tab daily in morning. Take additional in afternoon if weight up > 3 Lbs in 3 days, Disp: 180 tablet, Rfl: 3   TRUEPLUS LANCETS 33G MISC, USE TO CHECK BLOOD GLUCOSE QID - AFTER MEALS AND AT BEDTIME, Disp: , Rfl: 3   VENTOLIN HFA 108 (90 BASE) MCG/ACT inhaler, Use as directed 2 puffs in the mouth or throat 4  (four) times daily as needed for shortness of breath. , Disp: , Rfl: 3   Assessment     ICD-10-CM   1. Acute on chronic systolic heart failure (HCC)  Z61.09 torsemide (DEMADEX) 20 MG tablet    2. Bilateral leg edema  R60.0     3. NICM (nonischemic cardiomyopathy) (HCC)  I42.8 torsemide (DEMADEX) 20 MG tablet    CANCELED: EKG 12-Lead    4. ICD Abbott Unify Asura Biventricular implantable cardioverter-defibrillator (ICD) in situ 05/22/2022  Z95.810     5. Itching  L29.9 hydrOXYzine (ATARAX) 10 MG tablet      Medications Discontinued During This Encounter  Medication Reason   methocarbamol (ROBAXIN) 500 MG tablet    furosemide (LASIX) 40 MG tablet Change in therapy   Meds ordered this encounter  Medications   torsemide (DEMADEX) 20 MG tablet    Sig: Take 1 tablet (20 mg total) by mouth as directed. One tab daily in morning. Take additional in afternoon if weight up > 3 Lbs in 3 days    Dispense:  180 tablet    Refill:  3    Discontinue Furosemide   hydrOXYzine (ATARAX) 10 MG tablet    Sig: Take 1 tablet (10 mg total) by mouth 3 (three) times daily as needed.    Dispense:  30 tablet    Refill:  0   Recommendations:   Shelly Shields is a 42 y.o. African-American female with hypertension, hyperlipidemia, uncontrolled diabetes mellitus, obesity and obstructive sleep apnea on CPAP and history of Covid pneumonia in January 2021 during which time she had pulmonary embolism. 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  She has been on anticoagulation chronically with Eliquis.  She underwent BiV ICD implantation on 05/22/2022.  Patient has noticed worsening leg edema and shortness of breath and was seen in the emergency room 3 days ago.  1. Acute on chronic systolic heart failure (HCC) Patient is in acute heart failure, will discontinue furosemide as it is ineffective and switch her to  torsemide. Present on guideline  directed medical therapy with maximum dose of carvedilol, I will bring him, Entresto, spironolactone.  I have extensively discussed with her regarding lifestyle changes again.  Her heart rate was well-controlled, presently on Corlanor 5 mg twice daily.  Heart rate has increased from <70 previously in view of acute decompensated heart failure.  On her next office visit in 4 weeks, will increase to 7.5 mg twice daily if heart rate is not controlled.  - torsemide (DEMADEX) 20 MG tablet; Take 1 tablet (20 mg total) by mouth as directed. One tab daily in morning. Take additional in afternoon if weight up > 3 Lbs in 3 days  Dispense: 180 tablet; Refill: 3  2. Bilateral leg edema Hopefully with salt restriction, changing her diuretic, her heart failure symptoms and leg edema will improve.  3. NICM (nonischemic cardiomyopathy) (HCC) Patient has nonischemic dilated cardiomyopathy, risk factors are well-controlled including hypertension.  Her echocardiogram is overdue and will be scheduled soon. - torsemide (DEMADEX) 20 MG tablet; Take 1 tablet (20 mg total) by mouth as directed. One tab daily in morning. Take additional in afternoon if weight up > 3 Lbs in 3 days  Dispense: 180 tablet; Refill: 3  4. ICD Abbott Unify Asura Biventricular implantable cardioverter-defibrillator (ICD) in situ 05/22/2022 Will change her transmission monitoring to every 31 days for follow up of CHF  5. Itching Patient states that she has been itching all over and requested medication, sent in for Atarax. - hydrOXYzine (ATARAX) 10 MG tablet; Take 1 tablet (10 mg total) by mouth 3 (three) times daily as needed.  Dispense: 30 tablet; Refill: 0  Other orders - glycopyrrolate (ROBINUL) 1 MG tablet; Take 1 mg by mouth 3 (three) times daily as needed.   I reviewed all of her records from the emergency room visit, updated her labs.  This was a 40-minute office visit encounter.   Yates Decamp, MD,  [facility] 10/24/2022, 4:22 PM Office: [phone] Fax: [phone] Pager: [phone]

## 2022-10-26 ENCOUNTER — Encounter: Payer: Self-pay | Admitting: Cardiology

## 2022-10-28 ENCOUNTER — Other Ambulatory Visit: Payer: Self-pay

## 2022-10-28 ENCOUNTER — Encounter: Payer: Self-pay | Admitting: Cardiology

## 2022-10-28 ENCOUNTER — Telehealth: Payer: Self-pay

## 2022-10-28 DIAGNOSIS — I5022 Chronic systolic (congestive) heart failure: Secondary | ICD-10-CM

## 2022-10-28 NOTE — Telephone Encounter (Signed)
Patient back on KCL and checking her labs in 2 weeks NT Pro BNP and BMP in 2 weels

## 2022-10-28 NOTE — Telephone Encounter (Signed)
From patient.

## 2022-10-28 NOTE — Telephone Encounter (Signed)
Patient called stating that she is currently on Torsemide and Spironolactone. She is having mild cramping in her legs and she has not been taking her potassium.   Spoke with Dr. Jacinto Halim, she is to take Torsemide as needed and restart her potassium. Patient does not need refills, she still has a full bottle of potassium.   Also, patient is aware to go back to have blood work done in 2 weeks. Blood work has been ordered and released Patient is aware.

## 2022-10-31 ENCOUNTER — Emergency Department (HOSPITAL_COMMUNITY)
Admission: EM | Admit: 2022-10-31 | Discharge: 2022-11-01 | Disposition: A | Discharge status: Home / Self Care | Payer: 59 | Attending: Emergency Medicine | Admitting: Emergency Medicine

## 2022-10-31 DIAGNOSIS — I509 Heart failure, unspecified: Secondary | ICD-10-CM | POA: Diagnosis not present

## 2022-10-31 DIAGNOSIS — E119 Type 2 diabetes mellitus without complications: Secondary | ICD-10-CM | POA: Insufficient documentation

## 2022-10-31 DIAGNOSIS — Z7901 Long term (current) use of anticoagulants: Secondary | ICD-10-CM | POA: Diagnosis not present

## 2022-10-31 DIAGNOSIS — I110 Hypertensive heart disease with heart failure: Secondary | ICD-10-CM | POA: Diagnosis not present

## 2022-10-31 DIAGNOSIS — Z8673 Personal history of transient ischemic attack (TIA), and cerebral infarction without residual deficits: Secondary | ICD-10-CM | POA: Insufficient documentation

## 2022-10-31 DIAGNOSIS — M545 Low back pain, unspecified: Secondary | ICD-10-CM

## 2022-10-31 DIAGNOSIS — Z79899 Other long term (current) drug therapy: Secondary | ICD-10-CM | POA: Insufficient documentation

## 2022-10-31 NOTE — ED Triage Notes (Signed)
Presents with back spasms, states shoots down legs with movement. Began 3-4 days ago. Reports flank pain yesterday.  Denies recent fall r/t new pain. No previous episodes. Denies groin numbness.   Denies urinary symptoms, No periods d/t depo shot. Ambulatory

## 2022-11-01 ENCOUNTER — Emergency Department (HOSPITAL_COMMUNITY): Payer: 59

## 2022-11-01 ENCOUNTER — Other Ambulatory Visit: Payer: Self-pay

## 2022-11-01 LAB — URINALYSIS, ROUTINE W REFLEX MICROSCOPIC
Bilirubin Urine: NEGATIVE
Glucose, UA: 50 mg/dL — AB
Hgb urine dipstick: NEGATIVE
Ketones, ur: NEGATIVE mg/dL
Leukocytes,Ua: NEGATIVE
Nitrite: NEGATIVE
Protein, ur: NEGATIVE mg/dL
Specific Gravity, Urine: 1.011 (ref 1.005–1.030)
pH: 6 (ref 5.0–8.0)

## 2022-11-01 LAB — POC URINE PREG, ED: Preg Test, Ur: NEGATIVE

## 2022-11-01 MED ORDER — LIDOCAINE 5 % EX PTCH: 1.0000 | MEDICATED_PATCH | CUTANEOUS | 0 refills | Status: DC

## 2022-11-01 MED ORDER — LIDOCAINE 5 % EX PTCH
1.0000 | MEDICATED_PATCH | CUTANEOUS | Status: DC
  Administered 2022-11-01: 1 via TRANSDERMAL
  Filled 2022-11-01: qty 1

## 2022-11-01 MED ORDER — METHOCARBAMOL 500 MG PO TABS: 500.0000 mg | ORAL_TABLET | Freq: Two times a day (BID) | ORAL | 0 refills | Status: DC

## 2022-11-01 MED ORDER — ACETAMINOPHEN 325 MG PO TABS
650.0000 mg | ORAL_TABLET | Freq: Once | ORAL | Status: AC
  Administered 2022-11-01: 650 mg via ORAL
  Filled 2022-11-01: qty 2

## 2022-11-01 MED ORDER — METHOCARBAMOL 500 MG PO TABS
750.0000 mg | ORAL_TABLET | Freq: Once | ORAL | Status: AC
  Administered 2022-11-01: 750 mg via ORAL
  Filled 2022-11-01: qty 2

## 2022-11-01 NOTE — ED Notes (Signed)
Mini lab called to state POC pregnancy is negative.  She states that it has been slow to crossover lately

## 2022-11-01 NOTE — Discharge Instructions (Signed)
Your back pain is most likely due to a muscular strain.  There is been a lot of research on back pain, unfortunately the only thing that seems to really help is Tylenol and ibuprofen.  Relative rest is also important to not lift greater than 10 pounds bending or twisting at the waist.  Please follow-up with your family physician.  The other thing that really seems to benefit patients is physical therapy which your doctor may send you for.  Please return to the emergency department for new numbness or weakness to your arms or legs. Difficulty with urinating or urinating or pooping on yourself.  Also if you cannot feel toilet paper when you wipe or get a fever.   Take 4 over the counter ibuprofen tablets 3 times a day or 2 over-the-counter naproxen tablets twice a day for pain. Also take tylenol 1000mg (2 extra strength) four times a day.   Additionally, I have given you a prescription for Robaxin which is a muscle relaxer which you can take as prescribed as needed for any residual pain. This specifically works to help you get comfortable and sleep at night.  Do not drive or operate heavy machinery while taking this medication as it can be sedating.  I have also given you lidocaine patches for you to stay calm the area that hurts that may help as well.  Return if development of any new or worsening symptoms.

## 2022-11-01 NOTE — ED Provider Notes (Signed)
[place] EMERGENCY DEPARTMENT AT [facility] Provider Note   CSN: [id number] Arrival date & time: 10/31/22  2321     History  Chief Complaint  Patient presents with   Back Pain    Shelly Shields is a 42 y.o. female.  Patient with history of morbid obesity, hypertension, diabetes, CVA, PE on Eliquis, CHF presents today with complaints of back pain.  She states that same began initially on Monday and pain is located throughout her lower back and radiates into both of her legs.  It is worse with movement.  She denies any trauma or known injuries.  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 She does endorse some 'urine changes' in the past week, however states she was just changed from Lasix to Torsemide this week and suspects this is the cause. She denies any pain with urination, states 'it feels different.' Urination does not worsen her back pain. Denies any loss of bowel or bladder function or saddle paraesthesias. No new numbness or tingling in her legs. No hx of malignancy or IVDU. She tried a back brace without any improvement, has not tried anything else for her symptoms. Denies any history of back pain previously.   The history is provided by the patient. No language interpreter was used.  Back Pain      Home Medications Prior to Admission medications   Medication Sig Start Date End Date Taking? Authorizing Provider  apixaban (ELIQUIS) 5 MG TABS tablet Take 1 tablet (5 mg total) by mouth 2 (two) times daily. Resume on 11/2 morning 03/27/21   Patwardhan, Anabel Bene, MD  Baclofen 5 MG TABS Take 5-10 mg by mouth daily as needed (Migraines). 08/09/19   [provider]  Blood Glucose Monitoring Suppl (ACCU-CHEK NANO SMARTVIEW) W/DEVICE KIT 1 Device by Does not apply route 4 (four) times daily - after meals and at bedtime. 11/29/14   Abram Sander, MD  butalbital-acetaminophen-caffeine (FIORICET) [phone] MG tablet Take  1 tablet by mouth every 6 (six) hours as needed for headache. 12/11/21   [provider]  carvedilol (COREG) 25 MG tablet TAKE 2 TABLETS(50 MG) BY MOUTH TWICE DAILY WITH A MEAL 01/14/22   Patwardhan, Manish J, MD  Continuous Blood Gluc Sensor (DEXCOM G6 SENSOR) MISC SMARTSIG:1 Topical Every 10 Days 10/18/20   [provider]  Continuous Blood Gluc Transmit (DEXCOM G6 TRANSMITTER) MISC See admin instructions. 08/16/20   [provider]  Cyanocobalamin (B-12) 1000 MCG SUBL Place 1,000 mcg under the tongue daily.    [provider]  escitalopram (LEXAPRO) 10 MG tablet Take 10 mg by mouth daily. 03/01/20   [provider]  fexofenadine (ALLEGRA) 180 MG tablet Take 180 mg by mouth daily.    [provider]  FLOVENT HFA 110 MCG/ACT inhaler Inhale 2 puffs into the lungs 2 (two) times daily. 04/26/21   [provider]  fluticasone (FLONASE) 50 MCG/ACT nasal spray Place 2 sprays into both nostrils daily as needed for allergies.  09/22/14   [provider]  gabapentin (NEURONTIN) 300 MG capsule Take 300 mg by mouth 4 (four) times daily. 02/22/22   [provider]  glucose blood (ACCU-CHEK SMARTVIEW) test strip Check sugar 6 x daily Patient taking differently: Check sugar 6 x daily. Uses true test instead 11/29/14   Abram Sander, MD  glycopyrrolate (ROBINUL) 1 MG tablet Take 1 mg by mouth 3 (three) times daily as needed. 09/12/22  [provider]  HUMALOG KWIKPEN 200 UNIT/ML KwikPen Inject 10-50 Units into the skin See admin instructions. Via Insulin Pump 09/06/21   [provider]  hydrOXYzine (ATARAX) 10 MG tablet Take 1 tablet (10 mg total) by mouth 3 (three) times daily as needed. 10/24/22   Yates Decamp, MD  Insulin Disposable Pump (OMNIPOD DASH PODS, GEN 4,) MISC 200 Units every other day. Humalog 02/23/21   [provider]  Insulin Pen Needle (INSUPEN PEN NEEDLES) 32G X 4 MM MISC BD Pen Needles- brand specific.  Inject insulin via insulin pen daily 11/29/14   Abram Sander, MD  ivabradine (CORLANOR) 5 MG TABS tablet Take 1 tablet (5 mg total) by mouth 2 (two) times daily with a meal. 07/31/22   Yates Decamp, MD  Lancet Devices [facility]) lancets Use as instructed for blood glucose checks four times daily, before meals and at bedtime 11/29/14   Abram Sander, MD  medroxyPROGESTERone (DEPO-PROVERA) 150 MG/ML injection Inject 150 mg into the muscle every 3 (three) months.    [provider]  montelukast (SINGULAIR) 10 MG tablet Take 10 mg by mouth at bedtime.    [provider]  Multiple Vitamin (MULTI-VITAMIN) tablet Take 1 tablet by mouth daily.    [provider]  nortriptyline (PAMELOR) 10 MG capsule Take 20 mg by mouth at bedtime.    [provider]  NURTEC 75 MG TBDP Take 75 mg by mouth daily as needed (migraines). 04/02/21   [provider]  ondansetron (ZOFRAN-ODT) 4 MG disintegrating tablet Take 1 tablet (4 mg total) by mouth every 8 (eight) hours as needed for nausea or vomiting. 09/09/21   Karie Mainland, Amjad, PA-C  pantoprazole (PROTONIX) 40 MG tablet Take 40 mg by mouth daily.    [provider]  rosuvastatin (CRESTOR) 20 MG tablet Take 1 tablet (20 mg total) by mouth daily at 6 PM. 03/30/20   Elgergawy, Leana Roe, MD  sacubitril-valsartan (ENTRESTO) 97-103 MG TAKE 1 TABLET BY MOUTH TWICE DAILY 04/29/22   Patwardhan, Anabel Bene, MD  spironolactone (ALDACTONE) 25 MG tablet Take 1 tablet (25 mg total) by mouth daily. 10/04/22   Patwardhan, Anabel Bene, MD  topiramate (TOPAMAX) 100 MG tablet Take 100 mg by mouth 2 (two) times daily.    [provider]  torsemide (DEMADEX) 20 MG tablet Take 1 tablet (20 mg total) by mouth as directed. One tab daily in morning. Take additional in afternoon if weight up > 3 Lbs in 3 days 10/24/22 01/22/23  Yates Decamp, MD  TRUEPLUS LANCETS 33G MISC USE TO CHECK BLOOD GLUCOSE QID - AFTER MEALS AND AT BEDTIME 12/22/14   [provider]  VENTOLIN HFA 108 (90 BASE) MCG/ACT inhaler Use as directed 2 puffs in the mouth or throat 4 (four) times daily as needed for shortness of breath.  12/21/14   [provider]      Allergies    Farxiga [dapagliflozin], Ibuprofen, Ketorolac, Lisinopril, and Metformin    Review of Systems   Review of Systems  Musculoskeletal:  Positive for back pain.  All other systems reviewed and are negative.   Physical Exam Updated Vital Signs BP 120/78 (BP Location: Right Arm)   Pulse 80   Temp 98.2 F (36.8 C) (Oral)   Resp 18   Wt 122 kg   SpO2 100%   BMI 50.83 kg/m  Physical Exam Vitals and nursing note reviewed.  Constitutional:      General: She is not in acute  distress.    Appearance: Normal appearance. She is normal weight. She is not ill-appearing, toxic-appearing or diaphoretic.  HENT:     Head: Normocephalic and atraumatic.  Cardiovascular:     Rate and Rhythm: Normal rate.     Pulses:          Dorsalis pedis pulses are 2+ on the right side and 2+ on the left side.       Posterior tibial pulses are 2+ on the right side and 2+ on the left side.  Pulmonary:     Effort: Pulmonary effort is normal. No respiratory distress.  Abdominal:     General: Abdomen is flat.     Palpations: Abdomen is soft.     Tenderness: There is no abdominal tenderness.  Musculoskeletal:        General: Normal range of motion.     Cervical back: Normal range of motion.     Right lower leg: No edema.     Left lower leg: No edema.     Comments: Generalized TTP throughout the low lumbar spine. No focal bony tenderness, stepoffs, lesions, deformity, or overlying skin changes. Patient ambulatory with steady gait. Negative SLR bilaterally. DP and Pt pulses intact   Skin:    General: Skin is warm and dry.  Neurological:     General: No focal deficit present.     Mental Status: She is alert.  Psychiatric:        Mood and Affect: Mood normal.        Behavior: Behavior normal.      ED Results / Procedures / Treatments   Labs (all labs ordered are listed, but only abnormal results are displayed) Labs Reviewed  URINALYSIS, ROUTINE W REFLEX MICROSCOPIC - Abnormal; Notable for the following components:      Result Value   Glucose, UA 50 (*)    All other components within normal limits  POC URINE PREG, ED    EKG None  Radiology DG Lumbar Spine Complete  Result Date: 11/01/2022 CLINICAL DATA:  Acute lower back and bilateral lower extremity pain. EXAM: LUMBAR SPINE - COMPLETE 4+ VIEW COMPARISON:  September 09, 2021. FINDINGS: There is no evidence of lumbar spine fracture. Alignment is normal. Intervertebral disc spaces are maintained. IMPRESSION: Negative. Electronically Signed   By: Lupita Raider M.D.   On: 11/01/2022 09:29    Procedures Procedures    Medications Ordered in ED Medications - No data to display  ED Course/ Medical Decision Making/ A&P                             Medical Decision Making Amount and/or Complexity of Data Reviewed Labs: ordered. Radiology: ordered.  Risk Prescription drug management.   This patient is a 42 y.o. female who presents to the ED for concern of back pain.   Differential diagnoses prior to evaluation: Fracture (acute/chronic), muscle strain, cauda equina, spinal stenosis, DDD, ligamentous injury, disk herniation, metastatic cancer, vertebral osteomyelitis, kidney stone, pyelonephritis, AAA, pancreatitis, bowel obstruction, meningitis.  Past Medical History / Social History / Additional history: Chart reviewed. Pertinent results include: history of morbid obesity, hypertension, diabetes, CVA, PE on Eliquis, CHF  Physical Exam: Physical exam performed. The pertinent findings include: per above, mild generalized TTP throughout the lower back with no focal tenderness, stepoffs, lesions, deformity, or overlying skin changes  Lab Tests/Imaging studies: I personally interpreted labs/imaging and the pertinent  results include:  UA noninfectious, upreg  negative. Lumbar x ray unremarkable. I agree with the radiologist interpretation.  Medications / Treatment: Given lidocaine patch, robaxin, and tylenol. Reassessment shows improvement   Disposition: After consideration of the diagnostic results and the patients response to treatment, I feel that emergency department workup does not suggest an emergent condition requiring admission or immediate intervention beyond what has been performed at this time. The plan is: discharge with close follow-up.  Patient without neurological deficits and with normal neuro exam.  Patient can walk but states is painful.  No loss of bowel or bladder control.  No concern for cauda equina.  No fever, night sweats, weight loss, h/o cancer, IVDU. Suspect muscle strain, discussed with patient who is understanding and in agreement.  RICE protocol and pain medicine indicated and discussed with patient. Will also give rx for Robaxin and lidocaine patches for additional relief.  Patient advised not to drive or operate heavy machinery while taking this medication. Evaluation and diagnostic testing in the emergency department does not suggest an emergent condition requiring admission or immediate intervention beyond what has been performed at this time.  Plan for discharge with close PCP follow-up.  Patient is understanding and amenable with plan, educated on red flag symptoms that would prompt immediate return.  Patient discharged in stable condition.   Final Clinical Impression(s) / ED Diagnoses Final diagnoses:  Acute bilateral low back pain without sciatica    Rx / DC Orders ED Discharge Orders          Ordered    methocarbamol (ROBAXIN) 500 MG tablet  2 times daily        11/01/22 1003    lidocaine (LIDODERM) 5 %  Every 24 hours        11/01/22 1003          An After Visit Summary was printed and given to the patient.     Vear Clock 11/01/22 1005     Wynetta Fines, MD 11/01/22 1047

## 2022-11-01 NOTE — ED Notes (Signed)
DC instructions reviewed with pt.  Pt verbalized understanding. Pt Dc.  

## 2022-11-22 LAB — BASIC METABOLIC PANEL
BUN/Creatinine Ratio: 14 (ref 9–23)
BUN: 16 mg/dL (ref 6–24)
CO2: 19 mmol/L — ABNORMAL LOW (ref 20–29)
Calcium: 8.9 mg/dL (ref 8.7–10.2)
Chloride: 110 mmol/L — ABNORMAL HIGH (ref 96–106)
Creatinine, Ser: 1.12 mg/dL — ABNORMAL HIGH (ref 0.57–1.00)
Glucose: 228 mg/dL — ABNORMAL HIGH (ref 70–99)
Potassium: 4.1 mmol/L (ref 3.5–5.2)
Sodium: 141 mmol/L (ref 134–144)
eGFR: 63 mL/min/{1.73_m2} (ref >59)

## 2022-11-27 IMAGING — CT CT ABD-PELV W/ CM
2 of 5 series · 13 of 46 positions shown, 15 images · IV contrast (APPLIED)
Comparison: CT abdomen pelvis dated March 27, 2020.

CLINICAL DATA: Right-sided flank and abdominal pain. History of
renal infarct in 3531.

EXAM:
CT ABDOMEN AND PELVIS WITH CONTRAST
TECHNIQUE: Multidetector CT imaging of the abdomen and pelvis was performed
using the standard protocol following bolus administration of
intravenous contrast.

[Series 3: abdomen 5.0 · axial · 0.66mm/px · z∈[+504,+948]mm · 10 of 105 slices shown, 12 images]
[im 8/105  soft-tissue]
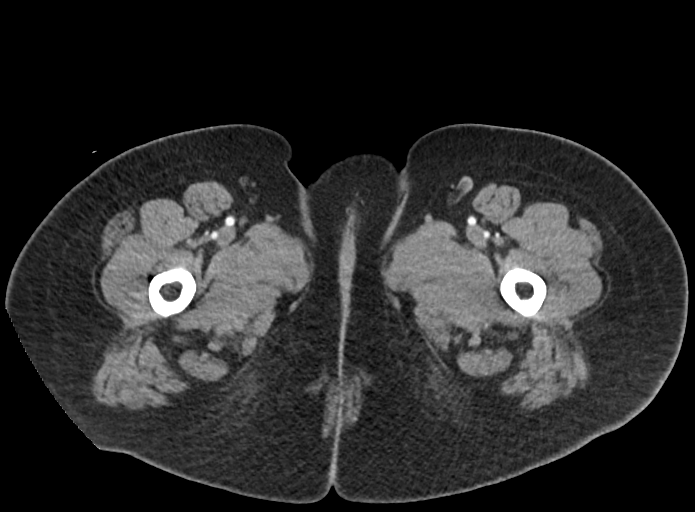
[im 8/105  bone]
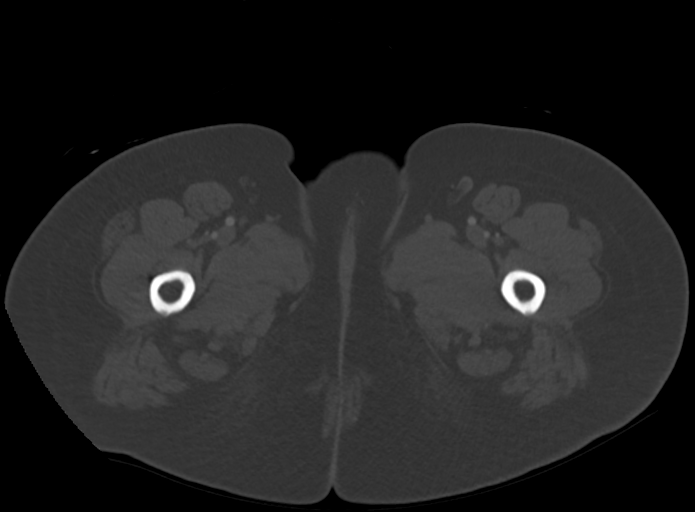
[im 15/105  soft-tissue]
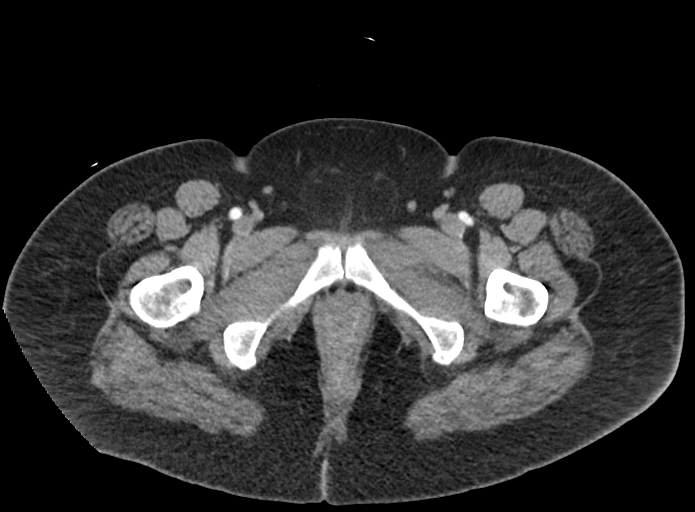
[im 30/105  soft-tissue]
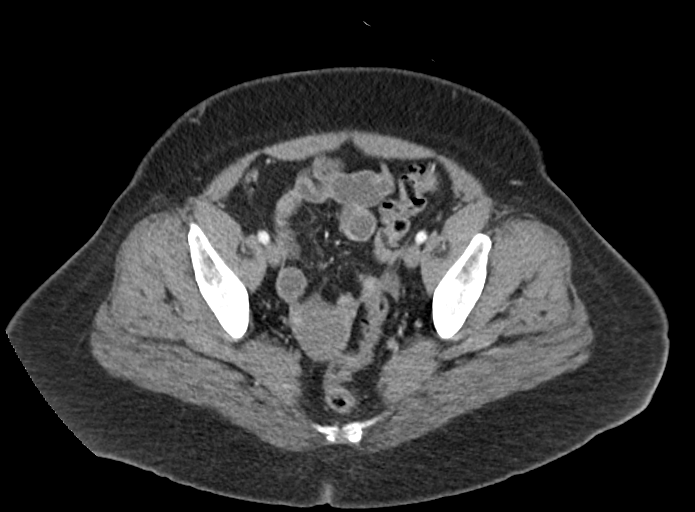
[im 38/105  soft-tissue]
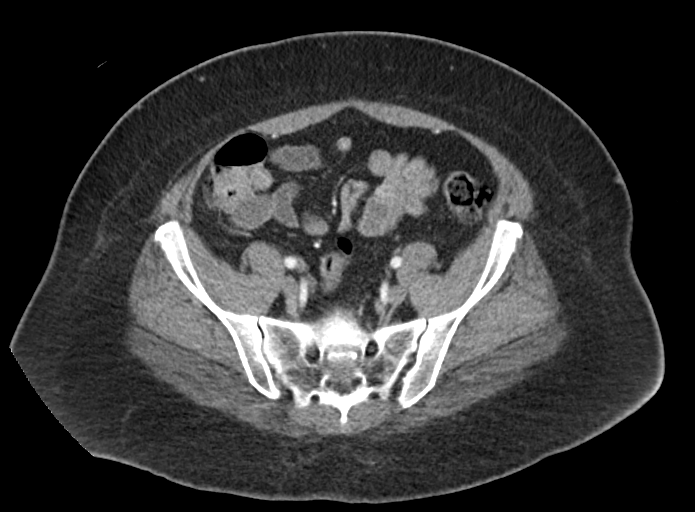
[im 45/105  soft-tissue]
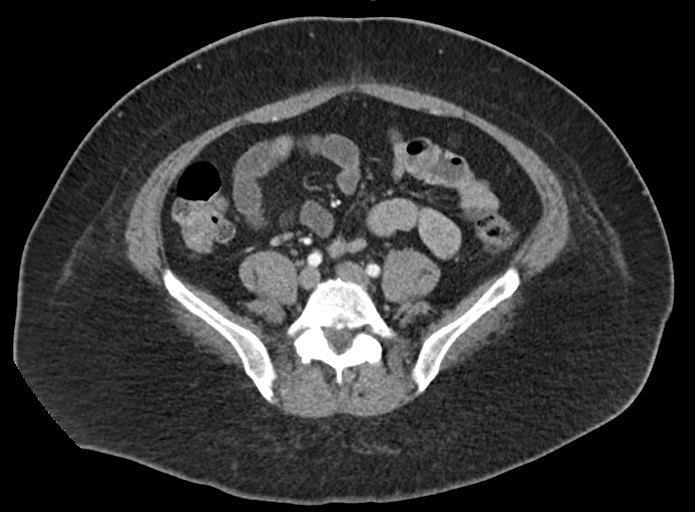
[im 60/105  soft-tissue]
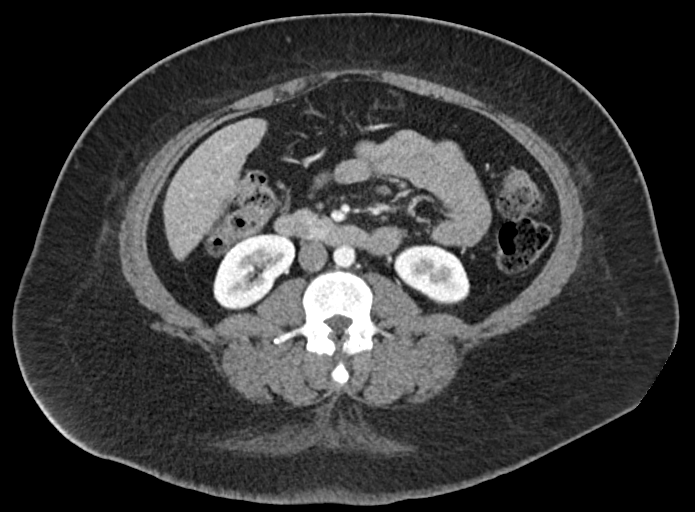
[im 67/105  soft-tissue]
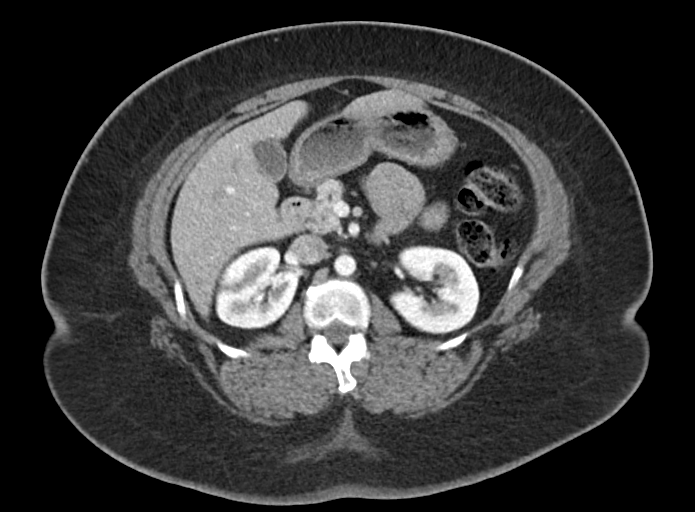
[im 75/105  soft-tissue]
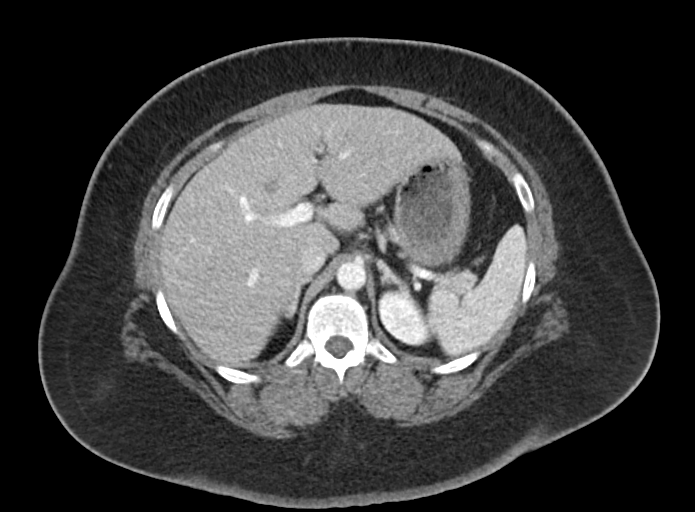
[im 90/105  soft-tissue]
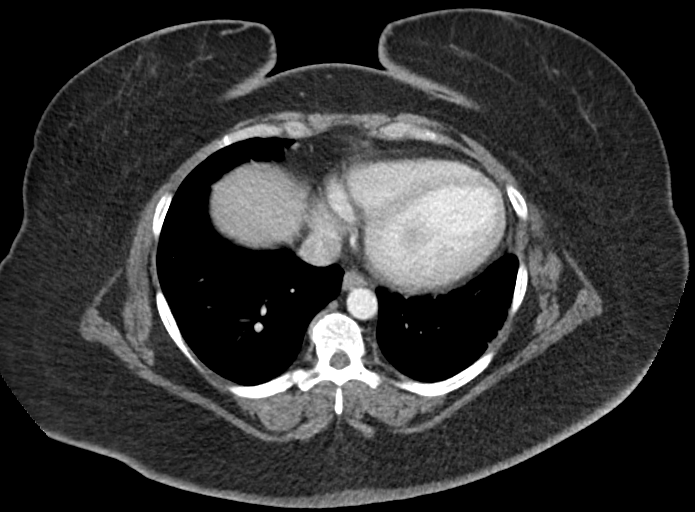
[im 90/105  bone]
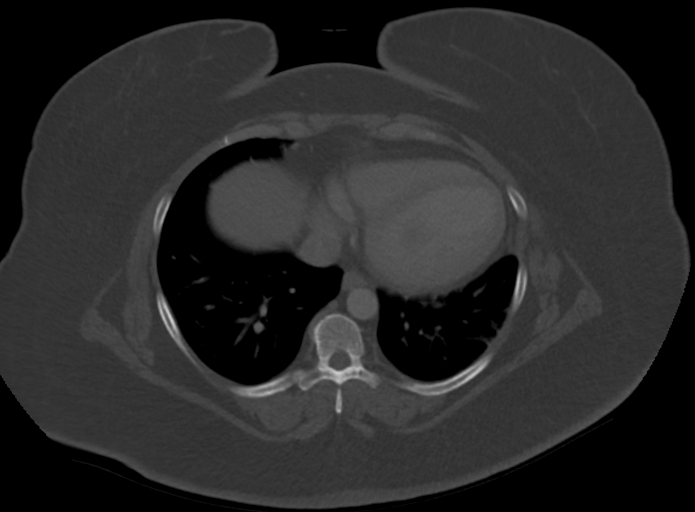
[im 97/105  soft-tissue]
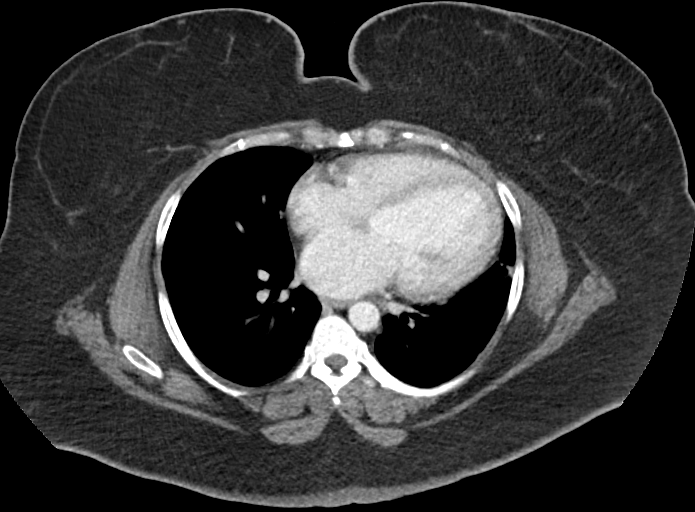

[Series 6: abdomen 3.0 mpr cor · coronal · 0.90mm/px · 3 of 113 slices shown]
[im 38/113  soft-tissue]
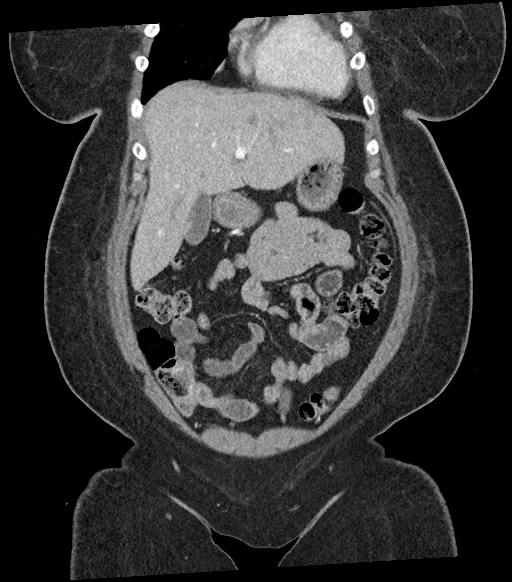
[im 50/113  soft-tissue]
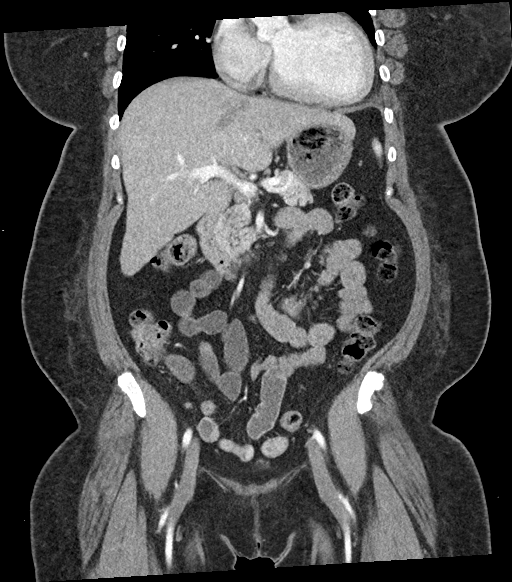
[im 63/113  soft-tissue]
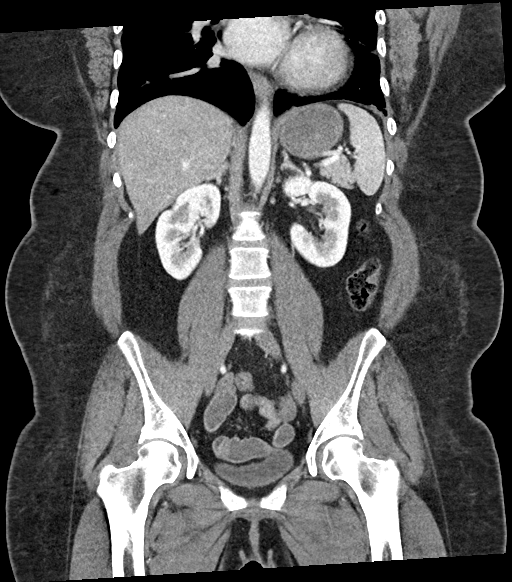

[13 of 46 positions shown; findings below may reference images not displayed]

RADIATION DOSE REDUCTION: This exam was performed according to the
departmental dose-optimization program which includes automated
exposure control, adjustment of the mA and/or kV according to
patient size and/or use of iterative reconstruction technique.

CONTRAST:  100mL OMNIPAQUE IOHEXOL 300 MG/ML  SOLN
FINDINGS: Lower chest: No acute abnormality. Chronic scarring at the left lung
base.

Hepatobiliary: No focal liver abnormality is seen. No gallstones,
gallbladder wall thickening, or biliary dilatation.

Pancreas: Unremarkable. No pancreatic ductal dilatation or
surrounding inflammatory changes.

Spleen: Normal in size without focal abnormality.

Adrenals/Urinary Tract: The adrenal glands are unremarkable. New
focal scarring in the upper and lower poles of the right kidney
related to prior renal insult seen on previous CT. No renal mass,
calculi, or hydronephrosis. The bladder is unremarkable for the
degree of distention.

Stomach/Bowel: Stomach is within normal limits. Appendix appears
normal. No evidence of bowel wall thickening, distention, or
inflammatory changes.

Vascular/Lymphatic: No significant vascular findings are present. No
enlarged abdominal or pelvic lymph nodes.

Reproductive: Uterus and bilateral adnexa are unremarkable.

Other: No abdominal wall hernia or abnormality. No abdominopelvic
ascites. No pneumoperitoneum.

Musculoskeletal: No acute or significant osseous findings.
IMPRESSION: 1. No acute intra-abdominal process.

## 2022-12-02 ENCOUNTER — Other Ambulatory Visit: Payer: 59

## 2022-12-02 ENCOUNTER — Ambulatory Visit: Payer: 59

## 2022-12-02 DIAGNOSIS — I5022 Chronic systolic (congestive) heart failure: Secondary | ICD-10-CM

## 2022-12-02 DIAGNOSIS — I428 Other cardiomyopathies: Secondary | ICD-10-CM

## 2022-12-10 NOTE — Progress Notes (Signed)
Echocardiogram 12/02/2022: Limited study. Left ventricle cavity is moderately dilated. Mild concentric hypertrophy of the left ventricle. Visualization of LV endocardium is difficult. Severe global hypokinesis. LVEF probably 30-35%. Inadequate Doppler data to assess diastolic function.  Compared to previous echocardiogram in 2022, LV cavity dilation is new.

## 2022-12-12 ENCOUNTER — Ambulatory Visit: Payer: 59 | Admitting: Cardiology

## 2022-12-12 ENCOUNTER — Encounter: Payer: Self-pay | Admitting: Cardiology

## 2022-12-12 DIAGNOSIS — I428 Other cardiomyopathies: Secondary | ICD-10-CM

## 2022-12-12 DIAGNOSIS — I447 Left bundle-branch block, unspecified: Secondary | ICD-10-CM

## 2022-12-12 DIAGNOSIS — Z95810 Presence of automatic (implantable) cardiac defibrillator: Secondary | ICD-10-CM

## 2022-12-12 DIAGNOSIS — Z4502 Encounter for adjustment and management of automatic implantable cardiac defibrillator: Secondary | ICD-10-CM

## 2022-12-12 DIAGNOSIS — I5022 Chronic systolic (congestive) heart failure: Secondary | ICD-10-CM

## 2022-12-12 MED ORDER — POTASSIUM CHLORIDE ER 10 MEQ PO TBCR
10.0000 meq | EXTENDED_RELEASE_TABLET | Freq: Every day | ORAL | 3 refills | Status: DC
Start: 2022-12-12 — End: 2023-08-19

## 2022-12-12 NOTE — Progress Notes (Signed)
Chief Complaint  Patient presents with   Pacemaker Check     ICD-10-CM   1. 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 Encounter for assessment of implantable cardioverter-defibrillator (ICD)  Z45.02     3. LBBB (left bundle branch block)  I44.7     4. Chronic systolic heart failure (HCC)  T55.73 torsemide (DEMADEX) 20 MG tablet    potassium chloride (KLOR-CON) 10 MEQ tablet    5. NICM (nonischemic cardiomyopathy) (HCC)  I42.8       Remote BiV ICD transmission 11/29/2022: Longevity 5 years and 11 months.  AP 1%, LV 99%.  There were no high rate episodes.  Lead impedance and thresholds are normal.  Daily activity has reduced since middle of May and ongoing.  Thoracic impedance is normal and does not suggest volume overload state.  Normal CRT-D function.  Scheduled  In office ICD 12/12/22  Single (S)/Dual (D)/BV (M) BV Presenting ASBP @ 70/min Pacer dependant: No. Underlying Sinus. AP <1%%. BP 100.% AMS Episodes 0.   HVR 0.  Longevity 5.7 Years/Voltage.  Lead measurements: Stable Histogram: Low (L)/normal (N)/high (H)  Normal Patient activity stable. Thoracic impedance: Baseline trended down for 8 days in 2nd-3rd week of April and second week of June 2024. Observations: Normal device function.  Changes: None  Patient states that she has had leg edema wants to continue taking torsemide on a daily basis.  Hence I prescribed the medication.  Discussed weight loss.  Patient previously had done well with Trulicity however she discontinued this nausea and vomiting as she could not eat.  I encouraged her to try it again to eat small meals and also help.  Spoke to definitely decrease her side effect she is willing to try Lasix I also discussed the findings of ICD, her physical activity also has been stable, it is less and could certainly improve.  She has had 2 episodes of mild decreased thoracic impedance since history  of fluid retention and CHF and I again discussed regarding importance of diet.  She will keep her appointment to see me next month.  I spent additional 10 minutes discussing heart failure, diet and exercise.   Yates Decamp, MD, [facility] 12/12/2022, 9:55 PM Office: [phone] Fax: [phone] Pager: [phone]

## 2022-12-16 ENCOUNTER — Other Ambulatory Visit: Payer: Self-pay | Admitting: Cardiology

## 2022-12-16 DIAGNOSIS — I5022 Chronic systolic (congestive) heart failure: Secondary | ICD-10-CM

## 2022-12-16 DIAGNOSIS — I420 Dilated cardiomyopathy: Secondary | ICD-10-CM

## 2022-12-18 ENCOUNTER — Other Ambulatory Visit: Payer: Self-pay | Admitting: Cardiology

## 2022-12-18 DIAGNOSIS — I420 Dilated cardiomyopathy: Secondary | ICD-10-CM

## 2022-12-18 DIAGNOSIS — I5022 Chronic systolic (congestive) heart failure: Secondary | ICD-10-CM

## 2022-12-27 ENCOUNTER — Emergency Department (HOSPITAL_COMMUNITY)
Admission: EM | Admit: 2022-12-27 | Discharge: 2022-12-27 | Disposition: A | Discharge status: Home / Self Care | Payer: 59 | Attending: Emergency Medicine | Admitting: Emergency Medicine

## 2022-12-27 ENCOUNTER — Other Ambulatory Visit: Payer: Self-pay

## 2022-12-27 ENCOUNTER — Encounter (HOSPITAL_COMMUNITY): Payer: Self-pay | Admitting: Emergency Medicine

## 2022-12-27 DIAGNOSIS — E1165 Type 2 diabetes mellitus with hyperglycemia: Secondary | ICD-10-CM | POA: Diagnosis not present

## 2022-12-27 DIAGNOSIS — Z794 Long term (current) use of insulin: Secondary | ICD-10-CM | POA: Diagnosis not present

## 2022-12-27 DIAGNOSIS — R944 Abnormal results of kidney function studies: Secondary | ICD-10-CM | POA: Insufficient documentation

## 2022-12-27 DIAGNOSIS — R739 Hyperglycemia, unspecified: Secondary | ICD-10-CM

## 2022-12-27 DIAGNOSIS — Z7901 Long term (current) use of anticoagulants: Secondary | ICD-10-CM | POA: Diagnosis not present

## 2022-12-27 LAB — CBC WITH DIFFERENTIAL/PLATELET
Abs Immature Granulocytes: 0.02 10*3/uL (ref 0.00–0.07)
Basophils Absolute: 0.1 10*3/uL (ref 0.0–0.1)
Basophils Relative: 1 %
Eosinophils Absolute: 0.1 10*3/uL (ref 0.0–0.5)
Eosinophils Relative: 1 %
HCT: 38.7 % (ref 36.0–46.0)
Hemoglobin: 12.2 g/dL (ref 12.0–15.0)
Immature Granulocytes: 0 %
Lymphocytes Relative: 31 %
Lymphs Abs: 2.5 10*3/uL (ref 0.7–4.0)
MCH: 28.8 pg (ref 26.0–34.0)
MCHC: 31.5 g/dL (ref 30.0–36.0)
MCV: 91.5 fL (ref 80.0–100.0)
Monocytes Absolute: 0.5 10*3/uL (ref 0.1–1.0)
Monocytes Relative: 6 %
Neutro Abs: 4.9 10*3/uL (ref 1.7–7.7)
Neutrophils Relative %: 61 %
Platelets: 207 10*3/uL (ref 150–400)
RBC: 4.23 MIL/uL (ref 3.87–5.11)
RDW: 13.3 % (ref 11.5–15.5)
WBC: 8.1 10*3/uL (ref 4.0–10.5)
nRBC: 0 % (ref 0.0–0.2)

## 2022-12-27 LAB — URINALYSIS, ROUTINE W REFLEX MICROSCOPIC
Bilirubin Urine: NEGATIVE
Glucose, UA: >=500 mg/dL — AB
Hgb urine dipstick: NEGATIVE
Ketones, ur: NEGATIVE mg/dL
Leukocytes,Ua: NEGATIVE
Nitrite: NEGATIVE
Protein, ur: NEGATIVE mg/dL
Specific Gravity, Urine: 1.012 (ref 1.005–1.030)
pH: 6 (ref 5.0–8.0)

## 2022-12-27 LAB — I-STAT VENOUS BLOOD GAS, ED
Acid-base deficit: 4 mmol/L — ABNORMAL HIGH (ref 0.0–2.0)
Bicarbonate: 20.7 mmol/L (ref 20.0–28.0)
Calcium, Ion: 1.22 mmol/L (ref 1.15–1.40)
HCT: 37 % (ref 36.0–46.0)
Hemoglobin: 12.6 g/dL (ref 12.0–15.0)
O2 Saturation: 52 %
Potassium: 3.9 mmol/L (ref 3.5–5.1)
Sodium: 139 mmol/L (ref 135–145)
TCO2: 22 mmol/L (ref 22–32)
pCO2, Ven: 35.3 mmHg — ABNORMAL LOW (ref 44–60)
pH, Ven: 7.377 (ref 7.25–7.43)
pO2, Ven: 28 mmHg — CL (ref 32–45)

## 2022-12-27 LAB — COMPREHENSIVE METABOLIC PANEL
ALT: 21 U/L (ref 0–44)
AST: 17 U/L (ref 15–41)
Albumin: 3.7 g/dL (ref 3.5–5.0)
Alkaline Phosphatase: 79 U/L (ref 38–126)
Anion gap: 10 (ref 5–15)
BUN: 13 mg/dL (ref 6–20)
CO2: 21 mmol/L — ABNORMAL LOW (ref 22–32)
Calcium: 9.2 mg/dL (ref 8.9–10.3)
Chloride: 107 mmol/L (ref 98–111)
Creatinine, Ser: 1.25 mg/dL — ABNORMAL HIGH (ref 0.44–1.00)
GFR, Estimated: 55 mL/min — ABNORMAL LOW (ref >60)
Glucose, Bld: 470 mg/dL — ABNORMAL HIGH (ref 70–99)
Potassium: 4 mmol/L (ref 3.5–5.1)
Sodium: 138 mmol/L (ref 135–145)
Total Bilirubin: 0.7 mg/dL (ref 0.3–1.2)
Total Protein: 7 g/dL (ref 6.5–8.1)

## 2022-12-27 LAB — CBG MONITORING, ED
Glucose-Capillary: 454 mg/dL — ABNORMAL HIGH (ref 70–99)
Glucose-Capillary: 427 mg/dL — ABNORMAL HIGH (ref 70–99)
Glucose-Capillary: 323 mg/dL — ABNORMAL HIGH (ref 70–99)

## 2022-12-27 LAB — LIPASE, BLOOD: Lipase: 34 U/L (ref 11–51)

## 2022-12-27 MED ORDER — SODIUM CHLORIDE 0.9 % IV BOLUS
1000.0000 mL | Freq: Once | INTRAVENOUS | Status: AC
  Administered 2022-12-27: 1000 mL via INTRAVENOUS

## 2022-12-27 MED ORDER — INSULIN ASPART 100 UNIT/ML IJ SOLN
8.0000 [IU] | Freq: Once | INTRAMUSCULAR | Status: AC
  Administered 2022-12-27: 8 [IU] via SUBCUTANEOUS

## 2022-12-27 NOTE — ED Provider Triage Note (Signed)
Emergency Medicine Provider Triage Evaluation Note  Shelly Shields , a 42 y.o. female  was evaluated in triage.  Pt complains of hyperglycemia.  Patient states that she does not think her pump is working and that today her sugars been running in the 400s.  Patient does note that she has history of DKA and is concerned that she is in DKA again.  Patient does endorse a headache with dizziness when she feels unstable with her hyperglycemia.  Patient denies any nausea/vomiting, abdominal pain, chest pain, shortness of breath.  Review of Systems  Positive: See HPI Negative: See HPI  Physical Exam  BP (!) 137/59 (BP Location: Right Arm)   Pulse 72   Temp 98.5 F (36.9 C) (Oral)   Resp 20   SpO2 99%  Gen:   Awake, no distress   Resp:  Normal effort  MSK:   Moves extremities without difficulty  Other:  Abdomen nontender palpation, no peritoneal signs noted  Medical Decision Making  Medically screening exam initiated at 4:38 PM.  Appropriate orders placed.  Shelly Shields was informed that the remainder of the evaluation will be completed by another provider, this initial triage assessment does not replace that evaluation, and the importance of remaining in the ED until their evaluation is complete.  Workup initiated, patient's CBG was 454 and so a liter of fluids was ordered and triage along with labs, patient stable at this time   Shelly Shields 12/27/22 1645

## 2022-12-27 NOTE — ED Provider Notes (Signed)
[place] EMERGENCY DEPARTMENT AT [facility] Provider Note   CSN: [id number] Arrival date & time: 12/27/22  1616     History  Chief Complaint  Patient presents with   Hyperglycemia    Shelly Shields is a 42 y.o. female.  Patient is a 42 year old female with a history of insulin-dependent diabetes who presents with hyperglycemia.  She said that her insulin pump stopped working and she was unable to get home to change it out.  She was concerned because her glucose was in the 400s.  She has had prior DKA.  She feels a little nauseated but otherwise no vomiting.  No fevers.  No abdominal pain.  No other recent illnesses.       Home Medications Prior to Admission medications   Medication Sig Start Date End Date Taking? Authorizing Provider  apixaban (ELIQUIS) 5 MG TABS tablet Take 1 tablet (5 mg total) by mouth 2 (two) times daily. Resume on 11/2 morning 03/27/21  Yes Patwardhan, Manish J, MD  Baclofen 5 MG TABS Take 5-10 mg by mouth daily as needed (Migraines). 08/09/19  Yes [provider]  butalbital-acetaminophen-caffeine (FIORICET) 50-325-40 MG tablet Take 1 tablet by mouth every 6 (six) hours as needed for headache. 12/11/21  Yes [provider]  carvedilol (COREG) 25 MG tablet TAKE 2 TABLETS(50 MG) BY MOUTH TWICE DAILY WITH A MEAL 12/17/22  Yes Yates Decamp, MD  CORLANOR 5 MG TABS tablet TAKE 1 TABLET(5 MG) BY MOUTH TWICE DAILY WITH A MEAL 12/18/22  Yes Yates Decamp, MD  Cyanocobalamin (B-12) 1000 MCG SUBL Place 1,000 mcg under the tongue daily.   Yes [provider]  ENTRESTO 97-103 MG TAKE 1 TABLET BY MOUTH TWICE DAILY 12/17/22  Yes Yates Decamp, MD  escitalopram (LEXAPRO) 10 MG tablet Take 10 mg by mouth daily. 03/01/20  Yes [provider]  fluticasone (FLONASE) 50 MCG/ACT nasal spray Place 2 sprays into both nostrils daily as needed for allergies.  09/22/14  Yes [provider]  gabapentin (NEURONTIN) 300 MG capsule Take 600 mg by  mouth 2 (two) times daily. 02/22/22  Yes [provider]  glycopyrrolate (ROBINUL) 1 MG tablet Take 1 mg by mouth 3 (three) times daily as needed (stomach acid). 09/12/22  Yes [provider]  HUMALOG KWIKPEN 200 UNIT/ML KwikPen Inject 10-50 Units into the skin See admin instructions. Via Insulin Pump 09/06/21  Yes [provider]  hydrOXYzine (ATARAX) 10 MG tablet Take 1 tablet (10 mg total) by mouth 3 (three) times daily as needed. Patient taking differently: Take 10 mg by mouth 3 (three) times daily as needed for itching or anxiety. 10/24/22  Yes Yates Decamp, MD  levocetirizine (XYZAL) 5 MG tablet Take 5 mg by mouth every evening.   Yes [provider]  medroxyPROGESTERone (DEPO-PROVERA) 150 MG/ML injection Inject 150 mg into the muscle every 3 (three) months.   Yes [provider]  montelukast (SINGULAIR) 10 MG tablet Take 10 mg by mouth at bedtime.   Yes [provider]  Multiple Vitamin (MULTI-VITAMIN) tablet Take 1 tablet by mouth daily.   Yes [provider]  nortriptyline (PAMELOR) 10 MG capsule Take 20 mg by mouth at bedtime.   Yes [provider]  NURTEC 75 MG TBDP Take 75 mg by mouth daily as needed (migraines). 04/02/21  Yes [provider]  ondansetron (ZOFRAN-ODT) 4 MG disintegrating tablet Take 1 tablet (4 mg total) by mouth every 8 (eight) hours as needed for nausea or  vomiting. 09/09/21  Yes Ali, Amjad, PA-C  pantoprazole (PROTONIX) 40 MG tablet Take 40 mg by mouth daily.   Yes [provider]  potassium chloride (KLOR-CON) 10 MEQ tablet Take 1 tablet (10 mEq total) by mouth daily. 12/12/22 03/12/23 Yes Yates Decamp, MD  rosuvastatin (CRESTOR) 20 MG tablet Take 1 tablet (20 mg total) by mouth daily at 6 PM. 03/30/20  Yes Elgergawy, Leana Roe, MD  spironolactone (ALDACTONE) 25 MG tablet Take 1 tablet (25 mg total) by mouth daily. 10/04/22  Yes Patwardhan, Manish J, MD  topiramate (TOPAMAX) 100 MG tablet Take  200 mg by mouth daily.   Yes [provider]  torsemide (DEMADEX) 20 MG tablet Take 1 tablet (20 mg total) by mouth daily. One tab daily in morning. Take additional in afternoon if weight up > 3 Lbs in 3 days 12/12/22  Yes Yates Decamp, MD  VENTOLIN HFA 108 (90 BASE) MCG/ACT inhaler Use as directed 2 puffs in the mouth or throat 4 (four) times daily as needed for shortness of breath.  12/21/14  Yes [provider]  Blood Glucose Monitoring Suppl (ACCU-CHEK NANO SMARTVIEW) W/DEVICE KIT 1 Device by Does not apply route 4 (four) times daily - after meals and at bedtime. 11/29/14   Abram Sander, MD  Continuous Blood Gluc Sensor (DEXCOM G6 SENSOR) MISC SMARTSIG:1 Topical Every 10 Days 10/18/20   [provider]  Continuous Blood Gluc Transmit (DEXCOM G6 TRANSMITTER) MISC See admin instructions. 08/16/20   [provider]  glucose blood (ACCU-CHEK SMARTVIEW) test strip Check sugar 6 x daily Patient taking differently: Check sugar 6 x daily. Uses true test instead 11/29/14   Abram Sander, MD  Insulin Disposable Pump (OMNIPOD DASH PODS, GEN 4,) MISC 200 Units every other day. Humalog 02/23/21   [provider]  Insulin Pen Needle (INSUPEN PEN NEEDLES) 32G X 4 MM MISC BD Pen Needles- brand specific. Inject insulin via insulin pen daily 11/29/14   Abram Sander, MD  Lancet Devices [facility]) lancets Use as instructed for blood glucose checks four times daily, before meals and at bedtime 11/29/14   Abram Sander, MD  methocarbamol (ROBAXIN) 500 MG tablet Take 1 tablet (500 mg total) by mouth 2 (two) times daily. Patient taking differently: Take 500 mg by mouth 2 (two) times daily as needed for muscle spasms. 11/01/22   Smoot, Shawn Route, PA-C  TRUEPLUS LANCETS 33G MISC USE TO CHECK BLOOD GLUCOSE QID - AFTER MEALS AND AT BEDTIME 12/22/14   [provider]      Allergies    Farxiga [dapagliflozin], Ibuprofen, Ketorolac, Lisinopril, and Metformin    Review of  Systems   Review of Systems  Constitutional:  Negative for chills, diaphoresis, fatigue and fever.  HENT:  Negative for congestion, rhinorrhea and sneezing.   Eyes: Negative.   Respiratory:  Negative for cough, chest tightness and shortness of breath.   Cardiovascular:  Negative for chest pain and leg swelling.  Gastrointestinal:  Positive for nausea. Negative for abdominal pain, blood in stool, diarrhea and vomiting.  Genitourinary:  Negative for difficulty urinating, flank pain, frequency and hematuria.  Musculoskeletal:  Negative for arthralgias and back pain.  Skin:  Negative for rash.  Neurological:  Negative for dizziness, speech difficulty, weakness, numbness and headaches.    Physical Exam Updated Vital Signs BP 112/64 (BP Location: Right Arm)   Pulse 66   Temp (!) 97.5 F (36.4 C) (Oral)   Resp 20   SpO2 100%  Physical Exam Constitutional:      Appearance: She is well-developed.  HENT:     Head: Normocephalic and atraumatic.  Eyes:     Pupils: Pupils are equal, round, and reactive to light.  Cardiovascular:     Rate and Rhythm: Normal rate and regular rhythm.     Heart sounds: Normal heart sounds.  Pulmonary:     Effort: Pulmonary effort is normal. No respiratory distress.     Breath sounds: Normal breath sounds. No wheezing or rales.  Chest:     Chest wall: No tenderness.  Abdominal:     General: Bowel sounds are normal.     Palpations: Abdomen is soft.     Tenderness: There is no abdominal tenderness. There is no guarding or rebound.  Musculoskeletal:        General: Normal range of motion.     Cervical back: Normal range of motion and neck supple.  Lymphadenopathy:     Cervical: No cervical adenopathy.  Skin:    General: Skin is warm and dry.     Findings: No rash.  Neurological:     Mental Status: She is alert and oriented to person, place, and time.     ED Results / Procedures / Treatments   Labs (all labs ordered are listed, but only abnormal  results are displayed) Labs Reviewed  COMPREHENSIVE METABOLIC PANEL - Abnormal; Notable for the following components:      Result Value   CO2 21 (*)    Glucose, Bld 470 (*)    Creatinine, Ser 1.25 (*)    GFR, Estimated 55 (*)    All other components within normal limits  URINALYSIS, ROUTINE W REFLEX MICROSCOPIC - Abnormal; Notable for the following components:   Color, Urine STRAW (*)    APPearance CLOUDY (*)    Glucose, UA >=500 (*)    Bacteria, UA RARE (*)    All other components within normal limits  CBG MONITORING, ED - Abnormal; Notable for the following components:   Glucose-Capillary 454 (*)    All other components within normal limits  I-STAT VENOUS BLOOD GAS, ED - Abnormal; Notable for the following components:   pCO2, Ven 35.3 (*)    pO2, Ven 28 (*)    Acid-base deficit 4.0 (*)    All other components within normal limits  CBG MONITORING, ED - Abnormal; Notable for the following components:   Glucose-Capillary 427 (*)    All other components within normal limits  CBG MONITORING, ED - Abnormal; Notable for the following components:   Glucose-Capillary 323 (*)    All other components within normal limits  CBC WITH DIFFERENTIAL/PLATELET  LIPASE, BLOOD    EKG None  Radiology No results found.  Procedures Procedures    Medications Ordered in ED Medications  sodium chloride 0.9 % bolus 1,000 mL (0 mLs Intravenous Stopped 12/27/22 2151)  sodium chloride 0.9 % bolus 1,000 mL (1,000 mLs Intravenous New Bag/Given 12/27/22 2155)  insulin aspart (novoLOG) injection 8 Units (8 Units Subcutaneous Given 12/27/22 2155)    ED Course/ Medical Decision Making/ A&P                                 Medical Decision Making Risk Prescription drug management.   Patient is a 42 year old female who presents with hyperglycemia.  Labs show no evidence of DKA.  Her creatinine is mildly elevated as compared to prior values.  Her urine  is not consistent with infection.  There is no  ketones.  She was given IV fluids and a dose of subcu insulin.  Her glucose is improving.  She was discharged home in good condition.  She was encouraged to restart her insulin pump when she gets home.  She was encouraged to follow-up with her primary care doctor and advised that she will need to have her creatinine rechecked.  Return precautions were given.  Final Clinical Impression(s) / ED Diagnoses Final diagnoses:  Hyperglycemia    Rx / DC Orders ED Discharge Orders     None         Rolan Bucco, MD 12/27/22 2312

## 2022-12-27 NOTE — Discharge Instructions (Addendum)
Make sure that you restart your insulin pump when she got home.  Return to emergency room if you have any worsening symptoms.  Your kidney function was slightly high.  This may be from dehydration but you need to have this rechecked by your primary care doctor.

## 2022-12-27 NOTE — ED Notes (Signed)
BS 427

## 2022-12-27 NOTE — ED Triage Notes (Signed)
Pt reports her insulin pump has malfunctioned and her sugars have been running in the 300s.  Pt states it went up to over 400 while at work and she decided to come in for eval.  Has not been able to correct her blood sugar on her own.  Pt states she "just feels bad"

## 2023-01-08 ENCOUNTER — Ambulatory Visit
Admission: EM | Admit: 2023-01-08 | Discharge: 2023-01-08 | Disposition: A | Discharge status: Home / Self Care | Payer: 59 | Attending: Family Medicine | Admitting: Family Medicine

## 2023-01-08 DIAGNOSIS — L97511 Non-pressure chronic ulcer of other part of right foot limited to breakdown of skin: Secondary | ICD-10-CM | POA: Diagnosis not present

## 2023-01-08 DIAGNOSIS — L97521 Non-pressure chronic ulcer of other part of left foot limited to breakdown of skin: Secondary | ICD-10-CM | POA: Diagnosis not present

## 2023-01-08 DIAGNOSIS — E11621 Type 2 diabetes mellitus with foot ulcer: Secondary | ICD-10-CM | POA: Diagnosis not present

## 2023-01-08 LAB — POCT FASTING CBG KUC MANUAL ENTRY: POCT Glucose (KUC): 261 mg/dL — AB (ref 70–99)

## 2023-01-08 MED ORDER — SULFAMETHOXAZOLE-TRIMETHOPRIM 800-160 MG PO TABS: 1.0000 | ORAL_TABLET | Freq: Two times a day (BID) | ORAL | 0 refills | Status: DC

## 2023-01-08 NOTE — ED Provider Notes (Signed)
[facility]    CSN: [id number] Arrival date & time: 01/08/23  0955      History   Chief Complaint Chief Complaint  Patient presents with   Blister    HPI Shelly Shields is a 42 y.o. female, DM uncontrolled, CHF, Hypertension, Asthma, PE.    HPI Patient with a history of poorly controlled diabetes presents to with a concern for a painful blister on the right heel of her foot. Her mother noticed it present yesterday. She endorses diminished sensation and doesn't complain of any significant pain. She has multiple calcified growths on her feet. She denies any prior evaluation or a formal foot exam by a podiatrist. No fever or drainage from the foot.  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 05/22/2022 05/22/2022   Migraines    Obesity    Pulmonary embolism (HCC) 05/2019   with COVID   Sleep apnea     Patient Active Problem List   Diagnosis Date Noted   Encounter for assessment of implantable cardioverter-defibrillator (ICD) 05/22/2022   ICD  Abbott Unify Asura Biventricular implantable cardioverter-defibrillator  (CRT-D) in situ 05/22/2022 05/22/2022   Pacemaker 05/22/2022   NICM (nonischemic cardiomyopathy) (HCC) 04/23/2022   LBBB (left bundle branch block) 04/23/2022   Chronic systolic heart failure (HCC) 03/26/2021   Acute CVA (cerebrovascular accident) (HCC) 03/27/2020   Sleep apnea    Diabetes mellitus without complication (HCC)    COVID-19 long hauler    Obesity    Acute pulmonary embolism (HCC) 06/30/2019   Pulmonary embolism (HCC) 05/2019   Hypernatremia    Asthma, chronic    DKA (diabetic ketoacidoses) 11/27/2014   Diabetes  mellitus, new onset (HCC)    Hyperglycemic hyperosmolar nonketotic coma (HCC)    Essential hypertension     Past Surgical History:  Procedure Laterality Date   BILATERAL CARPAL TUNNEL RELEASE     BIV ICD INSERTION CRT-D N/A 05/22/2022   Procedure: BIV ICD INSERTION CRT-D;  Surgeon: Duke Salvia, MD;  Location[facility] INVASIVE CV LAB;  Service: Cardiovascular;  Laterality: N/A;   LEAD REVISION/REPAIR N/A 05/22/2022   Procedure: LEAD REVISION/REPAIR;  Surgeon: Duke Salvia, MD;  Location: [facility] INVASIVE CV LAB;  Service: Cardiovascular;  Laterality: N/A;   LEFT HEART CATH AND CORONARY ANGIOGRAPHY N/A 03/27/2021   Procedure: LEFT HEART CATH AND CORONARY ANGIOGRAPHY;  Surgeon: Elder Negus, MD;  Location: [facility] INVASIVE CV LAB;  Service: Cardiovascular;  Laterality: N/A;   TEE WITHOUT CARDIOVERSION N/A 03/28/2020   Procedure: TRANSESOPHAGEAL ECHOCARDIOGRAM (TEE);  Surgeon: Yates Decamp, MD;  Location: [facility] ENDOSCOPY;  Service: Cardiovascular;  Laterality: N/A;    OB History   No obstetric history on file.      Home Medications    Prior to Admission medications   Medication Sig Start Date End Date Taking? Authorizing Provider  sulfamethoxazole-trimethoprim (BACTRIM DS) 800-160 MG tablet Take 1 tablet by mouth 2 (two) times daily. 01/08/23  Yes Bing Neighbors, NP  apixaban (ELIQUIS) 5 MG TABS tablet Take 1 tablet (5 mg total) by mouth 2 (two) times daily. Resume on 11/2 morning 03/27/21   Patwardhan, Anabel Bene, MD  Baclofen 5 MG TABS Take 5-10 mg by mouth  daily as needed (Migraines). 08/09/19   [provider]  Blood Glucose Monitoring Suppl (ACCU-CHEK NANO SMARTVIEW) W/DEVICE KIT 1 Device by Does not apply route 4 (four) times daily - after meals and at bedtime. 11/29/14   Abram Sander, MD  butalbital-acetaminophen-caffeine (FIORICET) [phone] MG tablet Take 1 tablet by mouth every 6 (six) hours as needed for headache. 12/11/21   [provider]  carvedilol (COREG) 25 MG  tablet TAKE 2 TABLETS(50 MG) BY MOUTH TWICE DAILY WITH A MEAL 12/17/22   Yates Decamp, MD  Continuous Blood Gluc Sensor (DEXCOM G6 SENSOR) MISC SMARTSIG:1 Topical Every 10 Days 10/18/20   [provider]  Continuous Blood Gluc Transmit (DEXCOM G6 TRANSMITTER) MISC See admin instructions. 08/16/20   [provider]  CORLANOR 5 MG TABS tablet TAKE 1 TABLET(5 MG) BY MOUTH TWICE DAILY WITH A MEAL 12/18/22   Yates Decamp, MD  Cyanocobalamin (B-12) 1000 MCG SUBL Place 1,000 mcg under the tongue daily.    [provider]  ENTRESTO 97-103 MG TAKE 1 TABLET BY MOUTH TWICE DAILY 12/17/22   Yates Decamp, MD  escitalopram (LEXAPRO) 10 MG tablet Take 10 mg by mouth daily. 03/01/20   [provider]  fluticasone (FLONASE) 50 MCG/ACT nasal spray Place 2 sprays into both nostrils daily as needed for allergies.  09/22/14   [provider]  gabapentin (NEURONTIN) 300 MG capsule Take 600 mg by mouth 2 (two) times daily. 02/22/22   [provider]  glucose blood (ACCU-CHEK SMARTVIEW) test strip Check sugar 6 x daily Patient taking differently: Check sugar 6 x daily. Uses true test instead 11/29/14   Adamo, Jeris Penta, MD  glycopyrrolate (ROBINUL) 1 MG tablet Take 1 mg by mouth 3 (three) times daily as needed (stomach acid). 09/12/22   [provider]  HUMALOG KWIKPEN 200 UNIT/ML KwikPen Inject 10-50 Units into the skin See admin instructions. Via Insulin Pump 09/06/21   [provider]  hydrOXYzine (ATARAX) 10 MG tablet Take 1 tablet (10 mg total) by mouth 3 (three) times daily as needed. Patient taking differently: Take 10 mg by mouth 3 (three) times daily as needed for itching or anxiety. 10/24/22   Yates Decamp, MD  Insulin Disposable Pump (OMNIPOD DASH PODS, GEN 4,) MISC 200 Units every other day. Humalog 02/23/21   [provider]  Insulin Pen Needle (INSUPEN PEN NEEDLES) 32G X 4 MM MISC BD Pen Needles- brand specific. Inject insulin via insulin pen daily  11/29/14   Abram Sander, MD  Lancet Devices [facility]) lancets Use as instructed for blood glucose checks four times daily, before meals and at bedtime 11/29/14   Abram Sander, MD  levocetirizine (XYZAL) 5 MG tablet Take 5 mg by mouth every evening.    [provider]  medroxyPROGESTERone (DEPO-PROVERA) 150 MG/ML injection Inject 150 mg into the muscle every 3 (three) months.    [provider]  methocarbamol (ROBAXIN) 500 MG tablet Take 1 tablet (500 mg total) by mouth 2 (two) times daily. Patient taking differently: Take 500 mg by mouth 2 (two) times daily as needed for muscle spasms. 11/01/22   Smoot, Sarah A, PA-C  montelukast (SINGULAIR) 10 MG tablet Take 10 mg by mouth at bedtime.    [provider]  Multiple Vitamin (MULTI-VITAMIN) tablet Take 1 tablet by mouth daily.    [provider]  nortriptyline (PAMELOR) 10 MG capsule Take 20 mg by mouth at bedtime.    [provider]  NURTEC 75  MG TBDP Take 75 mg by mouth daily as needed (migraines). 04/02/21   [provider]  ondansetron (ZOFRAN-ODT) 4 MG disintegrating tablet Take 1 tablet (4 mg total) by mouth every 8 (eight) hours as needed for nausea or vomiting. 09/09/21   Karie Mainland, Amjad, PA-C  pantoprazole (PROTONIX) 40 MG tablet Take 40 mg by mouth daily.    [provider]  potassium chloride (KLOR-CON) 10 MEQ tablet Take 1 tablet (10 mEq total) by mouth daily. 12/12/22 03/12/23  Yates Decamp, MD  rosuvastatin (CRESTOR) 20 MG tablet Take 1 tablet (20 mg total) by mouth daily at 6 PM. 03/30/20   Elgergawy, Leana Roe, MD  spironolactone (ALDACTONE) 25 MG tablet Take 1 tablet (25 mg total) by mouth daily. 10/04/22   Patwardhan, Anabel Bene, MD  topiramate (TOPAMAX) 100 MG tablet Take 200 mg by mouth daily.    [provider]  torsemide (DEMADEX) 20 MG tablet Take 1 tablet (20 mg total) by mouth daily. One tab daily in morning. Take additional in afternoon if weight up > 3 Lbs in 3  days 12/12/22   Yates Decamp, MD  TRUEPLUS LANCETS 33G MISC USE TO CHECK BLOOD GLUCOSE QID - AFTER MEALS AND AT BEDTIME 12/22/14   [provider]  VENTOLIN HFA 108 (90 BASE) MCG/ACT inhaler Use as directed 2 puffs in the mouth or throat 4 (four) times daily as needed for shortness of breath.  12/21/14   [provider]    Family History Family History  Problem Relation Age of Onset   Hypertension Mother    Thyroid disease Mother    Hypertension Father    Stroke Father 38   Diabetes Father    Sleep apnea Father    Colon cancer Maternal Uncle 60   Aneurysm Paternal Aunt 46   CVA Paternal Uncle 38   Cancer - Lung Maternal Grandmother    Heart failure Paternal Grandfather     Social History Social History   Tobacco Use   Smoking status: Never   Smokeless tobacco: Never  Vaping Use   Vaping status: Never Used  Substance Use Topics   Alcohol use: Yes    Alcohol/week: 0.0 standard drinks of alcohol    Comment: occasional    Drug use: No     Allergies   Farxiga [dapagliflozin], Ibuprofen, Ketorolac, Lisinopril, and Metformin   Review of Systems Review of Systems   Physical Exam Triage Vital Signs ED Triage Vitals  Encounter Vitals Group     BP 01/08/23 1058 (!) 150/81     Systolic BP Percentile --      Diastolic BP Percentile --      Pulse Rate 01/08/23 1058 82     Resp 01/08/23 1058 20     Temp 01/08/23 1058 98.5 F (36.9 C)     Temp Source 01/08/23 1058 Oral     SpO2 01/08/23 1058 99 %     Weight --      Height --      Head Circumference --      Peak Flow --      Pain Score 01/08/23 1103 5     Pain Loc --      Pain Education --      Exclude from Growth Chart --    No data found.  Updated Vital Signs BP (!) 150/81 (BP Location: Left Wrist)   Pulse 82   Temp 98.5 F (36.9 C) (Oral)   Resp 20   SpO2 99%  Visual Acuity Right Eye Distance:   Left Eye Distance:   Bilateral Distance:    Right Eye Near:   Left Eye Near:    Bilateral  Near:     Physical Exam Vitals reviewed.  Constitutional:      Appearance: Normal appearance.  HENT:     Head: Normocephalic and atraumatic.  Cardiovascular:     Rate and Rhythm: Normal rate and regular rhythm.  Pulmonary:     Effort: Pulmonary effort is normal.     Breath sounds: Normal breath sounds.  Musculoskeletal:        General: Normal range of motion.     Cervical back: Normal range of motion.  Feet:     Right foot:     Skin integrity: Skin breakdown, erythema, warmth, callus and dry skin present.     Toenail Condition: Right toenails are abnormally thick.     Left foot:     Skin integrity: Callus and dry skin present.     Toenail Condition: Left toenails are abnormally thick.  Neurological:     General: No focal deficit present.     Mental Status: She is alert and oriented to person, place, and time. Mental status is at baseline.      UC Treatments / Results  Labs (all labs ordered are listed, but only abnormal results are displayed) Labs Reviewed  POCT FASTING CBG [facility] MANUAL ENTRY - Abnormal; Notable for the following components:      Result Value   POCT Glucose ([facility]) 261 (*)    All other components within normal limits    EKG   Radiology No results found.  Procedures Procedures (including critical care time)  Medications Ordered in UC Medications - No data to display  Initial Impression / Assessment and Plan / UC Course  I have reviewed the triage vital signs and the nursing notes.  Pertinent labs & imaging results that were available during my care of the patient were reviewed by me and considered in my medical decision making (see chart for details).     Patient has multiple foot calcification and one area of redness on right foot which appears an evolving foot ulcer. Will cover empirically with antibiotics-Bactrim . Stat referral podiatry placed. Patient advised to call there office directly to secure next available appointment. Patient  verbalized understanding and agreement with plan. 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  Notify the scheduler that the referral is in the system.     ED Prescriptions     Medication Sig Dispense Auth. Provider   sulfamethoxazole-trimethoprim (BACTRIM DS) 800-160 MG tablet Take 1 tablet by mouth 2 (two) times daily. 20 tablet Bing Neighbors, NP      PDMP not reviewed this encounter.   Bing Neighbors, NP 01/13/23 1324

## 2023-01-08 NOTE — Discharge Instructions (Addendum)
Called podiatry office first thing in the morning to schedule an appointment.  Notify the scheduler that the referral is in the system.

## 2023-01-08 NOTE — ED Triage Notes (Signed)
Pt reports blisters in heel right foot since yesterday. Pt noticed as she was "walking funny". Pain is better as she do not have a shoe rubbing the blister.

## 2023-01-09 ENCOUNTER — Ambulatory Visit: Payer: 59 | Admitting: Podiatry

## 2023-01-09 ENCOUNTER — Encounter: Payer: Self-pay | Admitting: Podiatry

## 2023-01-09 DIAGNOSIS — L84 Corns and callosities: Secondary | ICD-10-CM

## 2023-01-09 DIAGNOSIS — E119 Type 2 diabetes mellitus without complications: Secondary | ICD-10-CM

## 2023-01-09 NOTE — Patient Instructions (Signed)
Look for urea 40% cream or ointment and apply to the thickened dry skin / calluses. This can be bought over the counter, at a pharmacy or online such as [employer].  

## 2023-01-12 NOTE — Progress Notes (Signed)
  Subjective:  Patient ID: Shelly Shields, female    DOB: [birth year],  MRN: [id number]  Chief Complaint  Patient presents with   Foot Pain    "I have a place on the back of my foot." N - callus L - posterior heel right D - 3 mos O - suddenly C - throbs A - shoes T - Urgent Care, [facility] DS    42 y.o. female presents with the above complaint. History confirmed with patient.  Thinks maybe her shoes were rubbing on it, she normally wears [employer].  Works at [employer].  Also has a large callus on the left great toe and the inside of the right arch  Objective:  Physical Exam: warm, good capillary refill, no trophic changes or ulcerative lesions, normal DP and PT pulses, normal sensory exam, and painful hyperkeratotic lesions, medial right arch, left hallux, posterior right heel, right heel ulcer has some ecchymotic bruising there is no ulceration active drainage blistering or signs of infection  Assessment:   1. Callus of foot      Plan:  Patient was evaluated and treated and all questions answered.   All symptomatic hyperkeratoses were safely debrided with a sterile #15 blade to patient's level of comfort without incident. We discussed preventative and palliative care of these lesions including supportive and accommodative shoegear, padding, prefabricated and custom molded accommodative orthoses, use of a pumice stone and lotions/creams daily. I recommended utilizing urea cream on a regular basis and she will work on this with a pumice stone.  We also discussed how her shoes likely are contribute to this and she will work on changing these and use a Tuli's heel cup that I recommended  Return if symptoms worsen or fail to improve.

## 2023-01-14 ENCOUNTER — Ambulatory Visit: Payer: 59 | Admitting: Cardiology

## 2023-01-28 ENCOUNTER — Ambulatory Visit: Payer: 59 | Admitting: Cardiology

## 2023-01-30 ENCOUNTER — Encounter: Payer: Self-pay | Admitting: Cardiology

## 2023-01-30 ENCOUNTER — Ambulatory Visit: Payer: 59 | Admitting: Cardiology

## 2023-01-30 ENCOUNTER — Ambulatory Visit: Payer: 59 | Admitting: Podiatry

## 2023-01-30 VITALS — BP 134/78 | HR 82 | Resp 16 | Ht 61.0 in | Wt 268.0 lb

## 2023-01-30 DIAGNOSIS — I5022 Chronic systolic (congestive) heart failure: Secondary | ICD-10-CM

## 2023-01-30 DIAGNOSIS — I420 Dilated cardiomyopathy: Secondary | ICD-10-CM

## 2023-01-30 DIAGNOSIS — N1831 Chronic kidney disease, stage 3a: Secondary | ICD-10-CM

## 2023-01-30 MED ORDER — TRULICITY 1.5 MG/0.5ML ~~LOC~~ SOAJ: 1.5000 mg | SUBCUTANEOUS | 1 refills | Status: DC

## 2023-01-30 MED ORDER — IVABRADINE HCL 7.5 MG PO TABS
7.5000 mg | ORAL_TABLET | Freq: Two times a day (BID) | ORAL | 1 refills | Status: DC
Start: 2023-01-30 — End: 2023-08-26

## 2023-01-30 NOTE — Progress Notes (Signed)
Primary Physician/Referring:  Everlean Cherry, MD  Patient ID: Shelly Shields, female    DOB: 03-07-1981, 42 y.o.   MRN: [id number]  Chief Complaint  Patient presents with   Acute on chronic systolic heart failure   Follow-up   HPI:    Shelly Shields  is a 42 y.o. African-American female with hypertension, hyperlipidemia, uncontrolled diabetes mellitus with stage IIIa chronic kidney disease, morbid obesity and obstructive sleep apnea on CPAP and history of Covid pneumonia in January 2021 during which time she had pulmonary embolism. 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  She has been on anticoagulation chronically with Eliquis.  She underwent BiV ICD implantation on 05/22/2022.  Patient was seen by me 6 weeks ago for acute decompensated heart failure, she now presents for follow-up.  Since making medication changes, symptoms of dyspnea has now stabilized and back to baseline, leg edema has resolved.  Continues to have fatigue.  She has been compliant with her CPAP.   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 05/22/2022 05/22/2022   Migraines    Obesity    Pulmonary embolism (HCC) 05/2019   with COVID   Sleep apnea    Past Surgical History:  Procedure Laterality Date   BILATERAL CARPAL TUNNEL RELEASE     BIV ICD INSERTION CRT-D N/A 05/22/2022   Procedure: BIV ICD INSERTION CRT-D;  Surgeon: Duke Salvia, MD;  Location: [facility] INVASIVE CV LAB;  Service: Cardiovascular;  Laterality: N/A;   LEAD REVISION/REPAIR N/A 05/22/2022   Procedure: LEAD REVISION/REPAIR;   Surgeon: Duke Salvia, MD;  Location: [facility] INVASIVE CV LAB;  Service: Cardiovascular;  Laterality: N/A;   LEFT HEART CATH AND CORONARY ANGIOGRAPHY N/A 03/27/2021   Procedure: LEFT HEART CATH AND CORONARY ANGIOGRAPHY;  Surgeon: Elder Negus, MD;  Location: [facility] INVASIVE CV LAB;  Service: Cardiovascular;  Laterality: N/A;   TEE WITHOUT CARDIOVERSION N/A 03/28/2020   Procedure: TRANSESOPHAGEAL ECHOCARDIOGRAM (TEE);  Surgeon: Yates Decamp, MD;  Location: [facility] ENDOSCOPY;  Service: Cardiovascular;  Laterality: N/A;   Family History  Problem Relation Age of Onset   Hypertension Mother    Thyroid disease Mother    Hypertension Father    Stroke Father 78   Diabetes Father    Sleep apnea Father    Colon cancer Maternal Uncle 60   Aneurysm Paternal Aunt 31   CVA Paternal Uncle 62   Cancer - Lung Maternal Grandmother    Heart failure Paternal Grandfather     Social History   Tobacco Use   Smoking status: Never   Smokeless tobacco: Never  Substance Use Topics   Alcohol use: Yes    Alcohol/week: 0.0 standard drinks of alcohol    Comment: occasional    Marital Status: Single   ROS  Review of Systems  Cardiovascular:  Positive for dyspnea on exertion and leg swelling (occasional). Negative for chest pain.   Objective  Blood pressure 134/78, pulse 82, resp. rate 16, height 5\' 1"  (1.549 m), weight 268 lb (121.6 kg), SpO2 98%.     01/30/2023    3:49 PM 01/08/2023   10:58 AM 12/27/2022  11:00 PM  Vitals with BMI  Height 5\' 1"     Weight 268 lbs    BMI 50.66    Systolic 134 150 433  Diastolic 78 81 69  Pulse 82 82 65      Physical Exam Constitutional:      Appearance: She is morbidly obese.  Neck:     Vascular: JVD present. No carotid bruit.  Cardiovascular:     Rate and Rhythm: Normal rate and regular rhythm.     Pulses: Intact distal pulses.     Heart sounds: Normal heart sounds. No murmur heard.    No gallop.  Pulmonary:     Effort: Pulmonary effort is normal.     Breath  sounds: Normal breath sounds.  Abdominal:     General: Bowel sounds are normal.     Palpations: Abdomen is soft.  Musculoskeletal:     Right lower leg: Edema (1-2 + pitting) present.     Left lower leg: Edema (1-2+ pitting) present.    Laboratory examination:   Recent Labs    02/25/22 1544 05/16/22 0922 10/21/22 1551 11/21/22 1121 12/27/22 1658 12/27/22 1702  NA 143   < > 140 141 138 139  K 3.8   < > 3.6 4.1 4.0 3.9  CL 110   < > 109 110* 107  --   CO2 27   < > 24 19* 21*  --   GLUCOSE 115*   < > 241* 228* 470*  --   BUN 14   < > 12 16 13   --   CREATININE 0.97   < > 1.07* 1.12* 1.25*  --   CALCIUM 9.0   < > 8.8* 8.9 9.2  --   GFRNONAA >60  --  >60  --  55*  --    < > = values in this interval not displayed.   CrCl cannot be calculated (Patient's most recent lab result is older than the maximum 21 days allowed.).     Latest Ref Rng & Units 12/27/2022    5:02 PM 12/27/2022    4:58 PM 11/21/2022   11:21 AM  CMP  Glucose 70 - 99 mg/dL  295  188   BUN 6 - 20 mg/dL  13  16   Creatinine 4.16 - 1.00 mg/dL  6.06  3.01   Sodium 601 - 145 mmol/L 139  138  141   Potassium 3.5 - 5.1 mmol/L 3.9  4.0  4.1   Chloride 98 - 111 mmol/L  107  110   CO2 22 - 32 mmol/L  21  19   Calcium 8.9 - 10.3 mg/dL  9.2  8.9   Total Protein 6.5 - 8.1 g/dL  7.0    Total Bilirubin 0.3 - 1.2 mg/dL  0.7    Alkaline Phos 38 - 126 U/L  79    AST 15 - 41 U/L  17    ALT 0 - 44 U/L  21        Latest Ref Rng & Units 12/27/2022    5:02 PM 12/27/2022    4:58 PM 10/21/2022    3:51 PM  CBC  WBC 4.0 - 10.5 K/uL  8.1  11.0   Hemoglobin 12.0 - 15.0 g/dL 09.3  23.5  57.3   Hematocrit 36.0 - 46.0 % 37.0  38.7  44.0   Platelets 150 - 400 K/uL  207  232    Lab Results  Component Value Date   CHOL 176 03/28/2020  HDL 29 (L) 03/28/2020   LDLCALC 126 (H) 03/28/2020   TRIG 106 03/28/2020   CHOLHDL 6.1 03/28/2020    HEMOGLOBIN A1C Lab Results  Component Value Date   HGBA1C 11.2 (H) 03/27/2020   MPG 274.74  03/27/2020   Lab Results  Component Value Date   TSH 3.896 03/28/2020    BNP    Component Value Date/Time   BNP 61.6 10/21/2022 1551   ProBNP    Component Value Date/Time   PROBNP 543 (H) 12/14/2021 0942   External labs:   Labs 12/25/2022:  A1c 8.8%.  Sodium 138, potassium 4.0, BUN 13, creatinine 1.25, EGFR 55 mL.  Serum glucose 470 mg.  Hb 12.2/HCT 38.7, platelets 207.  Normal indicis.  Labs 08/22/2022:  A1c 7.6%.  TSH normal at 2.305.  Total cholesterol 147, triglycerides 72, HDL 47, LDL 84.  Non-HDL cholesterol 100.  Radiology:   Chest x-ray two-view 10/21/2022: Left chest wall cardiac AICD with leads unchanged in position. Cardiac silhouette is moderately enlarged.  Mediastinal contours are within normal limits.  Moderate atherosclerotic calcifications within the aortic arch. The lungs are clear. No pulmonary edema, pleural effusion, or pneumothorax.  Mild multilevel degenerative disc changes of the thoracic spine.  Cardiac Studies:   PCV MYOCARDIAL PERFUSION WITH LEXISCAN 08/14/2020 Lexiscan nuclear stress test performed using 1-day protocol. Left ventricle is dilated. Severe decrease in global myocardial thickening and wall motion. Stress LVEF <20%. Large sized, medium intensity, fixed perfusion defect in anteroapical, apical, inferior, inferoseptal myocardium. High risk study   LONG TERM MONITOR (8-14 DAYS) 06/22/20-07/06/20:  Predominant underlying rhythm was sinus with minimum heart rate of 71 bpm, maximum heart rate of 193 bpm average heart rate of 91 bpm.  There were no patient triggered events.  PACs and PVCs were rare.  Patient with intermittent right bundle branch block.  2 episodes of supraventricular tachycardia with the longest lasting 12 seconds and a maximum heart rate of 93 bpm.  No evidence of atrial fibrillation.  No ventricular tachycardia, high degree AV block, pauses >3 seconds.  Left heart catheterization 03/27/2021:  LM: Normal LAD: Normal          Prox diag 1 30% disease Lcx: Normal RCA: Normal   Mildly decompensated nonischemic cardiomyopathy  Cardiac MR 05/03/2022: Moderate left ventricular enlargement and global hypokinesis, EF 34%.  Estimated cardiac output 4.4 L/min. Normal RV size and function, RVEF 51%. Normal cardiac valves. Trivial pericardial effusion. No late gadolinium uptake.  Echocardiogram 12/02/2022: Limited study. Left ventricle cavity is moderately dilated. Mild concentric hypertrophy of the left ventricle. Visualization of LV endocardium is difficult. Severe global hypokinesis. LVEF probably 30-35%. Inadequate Doppler data to assess diastolic function. Compared to previous echocardiogram in 2022, LV cavity dilation is new.  ICD Abbott Unify Asura Biventricular implantable cardioverter-defibrillator 07/23/2021   Remote BiV ICD transmission 11/29/2022: Longevity 5 years and 11 months.  AP 1%, LV 99%.  There were no high rate episodes.  Lead impedance and thresholds are normal.  Daily activity has reduced since middle of May and ongoing.  Thoracic impedance is normal and does not suggest volume overload state.  Normal CRT-D function.  EKG:   EKG 10/21/2022: Atrially sensed, BiV paced rhythm at rate of 93 bpm.  EKG 05/23/2022:  Atrially sensed  And biventricularly paced  Rhythm at the rate of 71 bpm.  No further analysis. Compared to 09/20/2021,  Wide QRS rhythm  QRS widening with  LBBB pattern no longer present.   Allergies   Allergies  Allergen  Reactions   Farxiga [Dapagliflozin] Other (See Comments)    Recurrent yeast infections   Ibuprofen Hives and Rash    Other Reaction(s): Other (See Comments)  asthma    Asthma asthma    Asthma Asthma asthma asthma    Asthma   Ketorolac Hives and Rash    Causes asthma attack    Causes asthma attack Causes asthma attack   Lisinopril Cough    Other Reaction(s): Cough, Other (See Comments)   Metformin Diarrhea    Current Outpatient Medications:     apixaban (ELIQUIS) 5 MG TABS tablet, Take 1 tablet (5 mg total) by mouth 2 (two) times daily. Resume on 11/2 morning, Disp: 60 tablet, Rfl: 0   Baclofen 5 MG TABS, Take 5-10 mg by mouth daily as needed (Migraines)., Disp: , Rfl:    Blood Glucose Monitoring Suppl (ACCU-CHEK NANO SMARTVIEW) W/DEVICE KIT, 1 Device by Does not apply route 4 (four) times daily - after meals and at bedtime., Disp: 1 kit, Rfl: 0   butalbital-acetaminophen-caffeine (FIORICET) 50-325-40 MG tablet, Take 1 tablet by mouth every 6 (six) hours as needed for headache., Disp: , Rfl:    carvedilol (COREG) 25 MG tablet, TAKE 2 TABLETS(50 MG) BY MOUTH TWICE DAILY WITH A MEAL, Disp: 180 tablet, Rfl: 3   Continuous Blood Gluc Sensor (DEXCOM G6 SENSOR) MISC, SMARTSIG:1 Topical Every 10 Days, Disp: , Rfl:    Continuous Blood Gluc Transmit (DEXCOM G6 TRANSMITTER) MISC, See admin instructions., Disp: , Rfl:    Cyanocobalamin (B-12) 1000 MCG SUBL, Place 1,000 mcg under the tongue daily., Disp: , Rfl:    Dulaglutide (TRULICITY) 1.5 MG/0.5ML SOPN, Inject 1.5 mg into the skin once a week., Disp: 2 mL, Rfl: 1   ENTRESTO 97-103 MG, TAKE 1 TABLET BY MOUTH TWICE DAILY, Disp: 180 tablet, Rfl: 1   escitalopram (LEXAPRO) 10 MG tablet, Take 10 mg by mouth daily., Disp: , Rfl:    fluticasone (FLONASE) 50 MCG/ACT nasal spray, Place 2 sprays into both nostrils daily as needed for allergies. , Disp: , Rfl: 3   gabapentin (NEURONTIN) 300 MG capsule, Take 600 mg by mouth 2 (two) times daily., Disp: , Rfl:    glucose blood (ACCU-CHEK SMARTVIEW) test strip, Check sugar 6 x daily (Patient taking differently: Check sugar 6 x daily. Uses true test instead), Disp: 200 each, Rfl: 3   glycopyrrolate (ROBINUL) 1 MG tablet, Take 1 mg by mouth 3 (three) times daily as needed (stomach acid)., Disp: , Rfl:    HUMALOG KWIKPEN 200 UNIT/ML KwikPen, Inject 10-50 Units into the skin See admin instructions. Via Insulin Pump, Disp: , Rfl:    hydrOXYzine (ATARAX) 10 MG tablet,  Take 1 tablet (10 mg total) by mouth 3 (three) times daily as needed. (Patient taking differently: Take 10 mg by mouth 3 (three) times daily as needed for itching or anxiety.), Disp: 30 tablet, Rfl: 0   Insulin Disposable Pump (OMNIPOD DASH PODS, GEN 4,) MISC, 200 Units every other day. Humalog, Disp: , Rfl:    Insulin Pen Needle (INSUPEN PEN NEEDLES) 32G X 4 MM MISC, BD Pen Needles- brand specific. Inject insulin via insulin pen daily, Disp: 200 each, Rfl: 3   Lancet Devices (ACCU-CHEK SOFTCLIX) lancets, Use as instructed for blood glucose checks four times daily, before meals and at bedtime, Disp: 100 each, Rfl: 5   levocetirizine (XYZAL) 5 MG tablet, Take 5 mg by mouth every evening., Disp: , Rfl:    medroxyPROGESTERone (DEPO-PROVERA) 150 MG/ML injection, Inject 150 mg  into the muscle every 3 (three) months., Disp: , Rfl:    montelukast (SINGULAIR) 10 MG tablet, Take 10 mg by mouth at bedtime., Disp: , Rfl:    Multiple Vitamin (MULTI-VITAMIN) tablet, Take 1 tablet by mouth daily., Disp: , Rfl:    nortriptyline (PAMELOR) 10 MG capsule, Take 20 mg by mouth at bedtime., Disp: , Rfl:    NURTEC 75 MG TBDP, Take 75 mg by mouth daily as needed (migraines)., Disp: , Rfl:    ondansetron (ZOFRAN-ODT) 4 MG disintegrating tablet, Take 1 tablet (4 mg total) by mouth every 8 (eight) hours as needed for nausea or vomiting., Disp: 20 tablet, Rfl: 0   pantoprazole (PROTONIX) 40 MG tablet, Take 40 mg by mouth daily., Disp: , Rfl:    potassium chloride (KLOR-CON) 10 MEQ tablet, Take 1 tablet (10 mEq total) by mouth daily., Disp: 90 tablet, Rfl: 3   rosuvastatin (CRESTOR) 20 MG tablet, Take 1 tablet (20 mg total) by mouth daily at 6 PM., Disp: 30 tablet, Rfl: 0   spironolactone (ALDACTONE) 25 MG tablet, Take 1 tablet (25 mg total) by mouth daily., Disp: 90 tablet, Rfl: 3   topiramate (TOPAMAX) 100 MG tablet, Take 200 mg by mouth daily., Disp: , Rfl:    torsemide (DEMADEX) 20 MG tablet, Take 1 tablet (20 mg total) by  mouth daily. One tab daily in morning. Take additional in afternoon if weight up > 3 Lbs in 3 days, Disp: , Rfl:    TRUEPLUS LANCETS 33G MISC, USE TO CHECK BLOOD GLUCOSE QID - AFTER MEALS AND AT BEDTIME, Disp: , Rfl: 3   VENTOLIN HFA 108 (90 BASE) MCG/ACT inhaler, Use as directed 2 puffs in the mouth or throat 4 (four) times daily as needed for shortness of breath. , Disp: , Rfl: 3   ivabradine (CORLANOR) 7.5 MG TABS tablet, Take 1 tablet (7.5 mg total) by mouth 2 (two) times daily with a meal., Disp: 180 tablet, Rfl: 1   Assessment     ICD-10-CM   1. Type 2 diabetes mellitus with stage 3a chronic kidney disease, with long-term current use of insulin (HCC)  E11.22 Dulaglutide (TRULICITY) 1.5 MG/0.5ML SOPN   N18.31    Z79.4     2. Dilated cardiomyopathy (HCC)  I42.0 ivabradine (CORLANOR) 7.5 MG TABS tablet    3. Chronic systolic heart failure (HCC)  V25.36 ivabradine (CORLANOR) 7.5 MG TABS tablet       Medications Discontinued During This Encounter  Medication Reason   methocarbamol (ROBAXIN) 500 MG tablet    sulfamethoxazole-trimethoprim (BACTRIM DS) 800-160 MG tablet    TRULICITY 0.75 MG/0.5ML SOPN Dose change   CORLANOR 5 MG TABS tablet Reorder   Meds ordered this encounter  Medications   Dulaglutide (TRULICITY) 1.5 MG/0.5ML SOPN    Sig: Inject 1.5 mg into the skin once a week.    Dispense:  2 mL    Refill:  1   ivabradine (CORLANOR) 7.5 MG TABS tablet    Sig: Take 1 tablet (7.5 mg total) by mouth 2 (two) times daily with a meal.    Dispense:  180 tablet    Refill:  1   Recommendations:   Shelly Shields is a 42 y.o. African-American female with hypertension, hyperlipidemia, uncontrolled diabetes mellitus with stage IIIa chronic kidney disease, morbid obesity and obstructive sleep apnea on CPAP and history of Covid pneumonia in January 2021 during which time she had pulmonary embolism. Hospitalized 03/27/2020 - 03/30/2020 with embolic CVA and infarcts to left  kidney, new onset  dilated cardiomyopathy with severe LV dysfunction.  She has been on anticoagulation chronically with Eliquis.  She underwent BiV ICD implantation on 05/22/2022.  1. 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  Also beginning to wonder whether uncontrolled hypertension, diabetes mellitus, morbid obesity is contributing to her cardiomyopathy.  Patient is now on guideline directed medical therapy with maximum dose of carvedilol at 50 mg twice daily, maximum dose of Entresto, spironolactone and Corlanor.  I will increase the dose Corlanor from 5 mg to 7.5 mg twice daily to get the heart rate <70 bpm.  We could also consider addition of Jardiance although patient is presently on insulin and also she restarted Trulicity recently.  Had  - ivabradine (CORLANOR) 7.5 MG TABS tablet; Take 1 tablet (7.5 mg total) by mouth 2 (two) times daily with a meal.  Dispense: 180 tablet; Refill: 1  2. Chronic systolic heart failure (HCC) Patient is presently doing well and no acute decompensated heart failure, symptoms of leg edema and dyspnea has improved.  - ivabradine (CORLANOR) 7.5 MG TABS tablet; Take 1 tablet (7.5 mg total) by mouth 2 (two) times daily with a meal.  Dispense: 180 tablet; Refill: 1  3. Type 2 diabetes mellitus with stage 3a chronic kidney disease, with long-term current use of insulin (HCC) Patient is presently back on Trulicity, she is only on 0.75 mg, will increase it to 1.5 mg subcu q. 1 week, patient has had constipation, but she is willing to increase the dose of Trulicity.  Morbid obesity continues to be a major issue as well.  Advised her to continue to increase the dose to the maximum tolerated dose and to follow-up with her PCP regarding this.  I will see her back in 3 months for follow-up.  - Dulaglutide (TRULICITY) 1.5 MG/0.5ML SOPN; Inject 1.5 mg into the skin once a week.  Dispense: 2 mL; Refill: 1     Yates Decamp, MD, [facility] 01/30/2023, 4:29 PM Office: [phone] Fax: [phone] Pager: [phone]

## 2023-02-13 ENCOUNTER — Encounter: Payer: Self-pay | Admitting: Cardiology

## 2023-02-18 ENCOUNTER — Telehealth: Payer: Self-pay | Admitting: Cardiology

## 2023-02-18 DIAGNOSIS — Z0279 Encounter for issue of other medical certificate: Secondary | ICD-10-CM

## 2023-02-18 NOTE — Telephone Encounter (Signed)
Form in Dr. Verl Dicker box.

## 2023-02-18 NOTE — Telephone Encounter (Signed)
Patient dropped FMLA forms off and paid to be filled out. 02-18-2023

## 2023-03-04 NOTE — Telephone Encounter (Signed)
Faxed Forms off and received the ok and scanned into chart. 03-04-2023. Thank and have a great day!!!

## 2023-04-09 ENCOUNTER — Ambulatory Visit: Payer: 59 | Admitting: Cardiology

## 2023-04-20 ENCOUNTER — Encounter (HOSPITAL_BASED_OUTPATIENT_CLINIC_OR_DEPARTMENT_OTHER): Payer: Self-pay | Admitting: Emergency Medicine

## 2023-04-20 ENCOUNTER — Ambulatory Visit (HOSPITAL_BASED_OUTPATIENT_CLINIC_OR_DEPARTMENT_OTHER)
Admission: EM | Admit: 2023-04-20 | Discharge: 2023-04-20 | Disposition: A | Discharge status: Home / Self Care | Payer: 59 | Attending: Emergency Medicine | Admitting: Emergency Medicine

## 2023-04-20 DIAGNOSIS — B372 Candidiasis of skin and nail: Secondary | ICD-10-CM | POA: Diagnosis not present

## 2023-04-20 MED ORDER — NYSTATIN 100000 UNIT/GM EX CREA: TOPICAL_CREAM | CUTANEOUS | 0 refills | Status: AC

## 2023-04-20 NOTE — ED Triage Notes (Signed)
Pt has rash on left breast. Denies change in soap, lotion, detergent. Hasn't tried any medications or creams.

## 2023-04-20 NOTE — ED Provider Notes (Signed)
[facility]    CSN: [id number] Arrival date & time: 04/20/23  0934      History   Chief Complaint Chief Complaint  Patient presents with   Rash    HPI Shelly Shields is a 42 y.o. female.  Last night developed rash under the left breast Has been sweating more recently History of diabetes, last A1c 8.6  Denies history of this No interventions yet No rash elsewhere  Denies changes in products. 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 05/22/2022 05/22/2022   Migraines    Obesity    Pulmonary embolism (HCC) 05/2019   with COVID   Sleep apnea     Patient Active Problem List   Diagnosis Date Noted   Encounter for assessment of implantable cardioverter-defibrillator (ICD) 05/22/2022   ICD  A[employer]ott Unify Asura Biventricular implantable cardioverter-defibrillator  (CRT-D) in situ 05/22/2022 05/22/2022   Pacemaker 05/22/2022   NICM (nonischemic cardiomyopathy) (HCC) 04/23/2022   LBBB (left bundle branch block) 04/23/2022   Chronic systolic heart failure (HCC) 03/26/2021   Acute CVA (cerebrovascular accident) (HCC) 03/27/2020   Sleep apnea    Diabetes mellitus without complication (HCC)    COVID-19 long hauler    Obesity    Acute pulmonary embolism (HCC) 06/30/2019   Pulmonary embolism (HCC) 05/2019   Hypernatremia    Asthma, chronic    DKA (diabetic ketoacidosis) (HCC) 11/27/2014   Diabetes mellitus, new onset (HCC)    Hyperglycemic hyperosmolar nonketotic coma (HCC)    Essential hypertension     Past Surgical History:  Procedure Laterality Date   BILATERAL CARPAL TUNNEL RELEASE     BIV ICD  INSERTION CRT-D N/A 05/22/2022   Procedure: BIV ICD INSERTION CRT-D;  Surgeon: Duke Salvia, MD;  Location: [facility] INVASIVE CV LAB;  Service: Cardiovascular;  Laterality: N/A;   LEAD REVISION/REPAIR N/A 05/22/2022   Procedure: LEAD REVISION/REPAIR;  Surgeon: Duke Salvia, MD;  Location: [facility] INVASIVE CV LAB;  Service: Cardiovascular;  Laterality: N/A;   LEFT HEART CATH AND CORONARY ANGIOGRAPHY N/A 03/27/2021   Procedure: LEFT HEART CATH AND CORONARY ANGIOGRAPHY;  Surgeon: Elder Negus, MD;  Location: [facility] INVASIVE CV LAB;  Service: Cardiovascular;  Laterality: N/A;   TEE WITHOUT CARDIOVERSION N/A 03/28/2020   Procedure: TRANSESOPHAGEAL ECHOCARDIOGRAM (TEE);  Surgeon: Yates Decamp, MD;  Location: [facility] ENDOSCOPY;  Service: Cardiovascular;  Laterality: N/A;   triger finger release      OB History   No obstetric history on file.      Home Medications    Prior to Admission medications   Medication Sig Start Date End Date Taking? Authorizing Provider  nystatin cream (MYCOSTATIN) Apply to affected area 2 times daily 04/20/23  Yes Raina Sole, Lurena Joiner, PA-C  apixaban (ELIQUIS) 5 MG TABS tablet Take 1 tablet (5 mg total) by mouth 2 (two) times daily. Resume on 11/2 morning 03/27/21   Patwardhan, Anabel Bene, MD  Baclofen 5 MG TABS Take 5-10 mg by mouth daily as needed (Migraines). 08/09/19   [provider]  Blood Glucose Monitoring Suppl (ACCU-CHEK NANO SMARTVIEW) W/DEVICE KIT 1 Device by Does not apply route 4 (four) times daily - after meals  and at bedtime. 11/29/14   Abram Sander, MD  butalbital-acetaminophen-caffeine (FIORICET) [phone] MG tablet Take 1 tablet by mouth every 6 (six) hours as needed for headache. 12/11/21   [provider]  carvedilol (COREG) 25 MG tablet TAKE 2 TABLETS(50 MG) BY MOUTH TWICE DAILY WITH A MEAL 12/17/22   Yates Decamp, MD  Continuous Blood Gluc Sensor (DEXCOM G6 SENSOR) MISC SMARTSIG:1 Topical Every 10 Days 10/18/20   [provider]  Continuous  Blood Gluc Transmit (DEXCOM G6 TRANSMITTER) MISC See admin instructions. 08/16/20   [provider]  Cyanocobalamin (B-12) 1000 MCG SUBL Place 1,000 mcg under the tongue daily.    [provider]  Dulaglutide (TRULICITY) 1.5 MG/0.5ML SOPN Inject 1.5 mg into the skin once a week. 01/30/23   Yates Decamp, MD  ENTRESTO 97-103 MG TAKE 1 TABLET BY MOUTH TWICE DAILY 12/17/22   Yates Decamp, MD  escitalopram (LEXAPRO) 10 MG tablet Take 10 mg by mouth daily. 03/01/20   [provider]  fluticasone (FLONASE) 50 MCG/ACT nasal spray Place 2 sprays into both nostrils daily as needed for allergies.  09/22/14   [provider]  gabapentin (NEURONTIN) 300 MG capsule Take 600 mg by mouth 2 (two) times daily. 02/22/22   [provider]  glucose blood (ACCU-CHEK SMARTVIEW) test strip Check sugar 6 x daily Patient taking differently: Check sugar 6 x daily. Uses true test instead 11/29/14   Adamo, Jeris Penta, MD  glycopyrrolate (ROBINUL) 1 MG tablet Take 1 mg by mouth 3 (three) times daily as needed (stomach acid). 09/12/22   [provider]  HUMALOG KWIKPEN 200 UNIT/ML KwikPen Inject 10-50 Units into the skin See admin instructions. Via Insulin Pump 09/06/21   [provider]  hydrOXYzine (ATARAX) 10 MG tablet Take 1 tablet (10 mg total) by mouth 3 (three) times daily as needed. Patient taking differently: Take 10 mg by mouth 3 (three) times daily as needed for itching or anxiety. 10/24/22   Yates Decamp, MD  Insulin Disposable Pump (OMNIPOD DASH PODS, GEN 4,) MISC 200 Units every other day. Humalog 02/23/21   [provider]  Insulin Pen Needle (INSUPEN PEN NEEDLES) 32G X 4 MM MISC BD Pen Needles- brand specific. Inject insulin via insulin pen daily 11/29/14   Abram Sander, MD  ivabradine (CORLANOR) 7.5 MG TABS tablet Take 1 tablet (7.5 mg total) by mouth 2 (two) times daily with a meal. 01/30/23   Yates Decamp, MD  Lancet Devices [facility]) lancets Use as  instructed for blood glucose checks four times daily, before meals and at bedtime 11/29/14   Abram Sander, MD  levocetirizine (XYZAL) 5 MG tablet Take 5 mg by mouth every evening.    [provider]  medroxyPROGESTERone (DEPO-PROVERA) 150 MG/ML injection Inject 150 mg into the muscle every 3 (three) months.    [provider]  montelukast (SINGULAIR) 10 MG tablet Take 10 mg by mouth at bedtime.    [provider]  Multiple Vitamin (MULTI-VITAMIN) tablet Take 1 tablet by mouth daily.    [provider]  nortriptyline (PAMELOR) 10 MG capsule Take 20 mg by mouth at bedtime.    [provider]  NURTEC 75 MG TBDP Take 75 mg by mouth daily as needed (migraines). 04/02/21   [provider]  ondansetron (ZOFRAN-ODT) 4 MG disintegrating tablet Take 1 tablet (4 mg total) by mouth every 8 (eight) hours as needed for nausea or vomiting. 09/09/21   Marita Kansas, PA-C  pantoprazole (  PROTONIX) 40 MG tablet Take 40 mg by mouth daily.    [provider]  potassium chloride (KLOR-CON) 10 MEQ tablet Take 1 tablet (10 mEq total) by mouth daily. 12/12/22 03/12/23  Yates Decamp, MD  rosuvastatin (CRESTOR) 20 MG tablet Take 1 tablet (20 mg total) by mouth daily at 6 PM. 03/30/20   Elgergawy, Leana Roe, MD  spironolactone (ALDACTONE) 25 MG tablet Take 1 tablet (25 mg total) by mouth daily. 10/04/22   Patwardhan, Anabel Bene, MD  topiramate (TOPAMAX) 100 MG tablet Take 200 mg by mouth daily.    [provider]  torsemide (DEMADEX) 20 MG tablet Take 1 tablet (20 mg total) by mouth daily. One tab daily in morning. Take additional in afternoon if weight up > 3 Lbs in 3 days 12/12/22   Yates Decamp, MD  TRUEPLUS LANCETS 33G MISC USE TO CHECK BLOOD GLUCOSE QID - AFTER MEALS AND AT BEDTIME 12/22/14   [provider]  VENTOLIN HFA 108 (90 BASE) MCG/ACT inhaler Use as directed 2 puffs in the mouth or throat 4 (four) times daily as needed for shortness of breath.  12/21/14    [provider]    Family History Family History  Problem Relation Age of Onset   Hypertension Mother    Thyroid disease Mother    Hypertension Father    Stroke Father 38   Diabetes Father    Sleep apnea Father    Colon cancer Maternal Uncle 60   Aneurysm Paternal Aunt 23   CVA Paternal Uncle 64   Cancer - Lung Maternal Grandmother    Heart failure Paternal Grandfather     Social History Social History   Tobacco Use   Smoking status: Never   Smokeless tobacco: Never  Vaping Use   Vaping status: Never Used  Substance Use Topics   Alcohol use: Yes    Alcohol/week: 0.0 standard drinks of alcohol    Comment: occasional    Drug use: No     Allergies   Farxiga [dapagliflozin], Ibuprofen, Ketorolac, Lisinopril, and Metformin   Review of Systems Review of Systems  Skin:  Positive for rash.   Per HPI  Physical Exam Triage Vital Signs ED Triage Vitals  Encounter Vitals Group     BP 04/20/23 0945 (!) 158/92     Systolic BP Percentile --      Diastolic BP Percentile --      Pulse Rate 04/20/23 0945 85     Resp 04/20/23 0945 20     Temp 04/20/23 0945 98.7 F (37.1 C)     Temp Source 04/20/23 0945 Oral     SpO2 04/20/23 0945 98 %     Weight --      Height --      Head Circumference --      Peak Flow --      Pain Score 04/20/23 0944 0     Pain Loc --      Pain Education --      Exclude from Growth Chart --    No data found.  Updated Vital Signs BP (!) 158/92 (BP Location: Right Arm)   Pulse 85   Temp 98.7 F (37.1 C) (Oral)   Resp 20   SpO2 98%    Physical Exam Vitals and nursing note reviewed.  Constitutional:      General: She is not in acute distress.    Appearance: Normal appearance.  HENT:     Mouth/Throat:     Mouth:  Mucous membranes are moist.     Pharynx: Oropharynx is clear.  Eyes:     Conjunctiva/sclera: Conjunctivae normal.  Cardiovascular:     Rate and Rhythm: Normal rate and regular rhythm.     Pulses: Normal pulses.      Heart sounds: Normal heart sounds.  Pulmonary:     Effort: Pulmonary effort is normal.     Breath sounds: Normal breath sounds.  Musculoskeletal:        General: Normal range of motion.     Cervical back: Normal range of motion.  Skin:    Findings: Rash present.     Comments: Erythematous patchy area under the left breast. Patient with large breast tissue, rash in skin folds. No rash elsewhere   Neurological:     Mental Status: She is alert and oriented to person, place, and time.    UC Treatments / Results  Labs (all labs ordered are listed, but only abnormal results are displayed) Labs Reviewed - No data to display  EKG   Radiology No results found.  Procedures Procedures (including critical care time)  Medications Ordered in UC Medications - No data to display  Initial Impression / Assessment and Plan / UC Course  I have reviewed the triage vital signs and the nursing notes.  Pertinent labs & imaging results that were available during my care of the patient were reviewed by me and considered in my medical decision making (see chart for details).  Suspect fungal; rash in skin folds and uncontrolled diabetes Recommend nystatin cream BID x 1-2 weeks She is following with her PCP soon for DM management Discussed return precautions Patient agrees to plan, no questions   Final Clinical Impressions(s) / UC Diagnoses   Final diagnoses:  Candidal skin infection     Discharge Instructions      Apply cream twice daily for 1-2 weeks Keep area clean and dry. Always pat dry after a shower!    ED Prescriptions     Medication Sig Dispense Auth. Provider   nystatin cream (MYCOSTATIN) Apply to affected area 2 times daily 30 g Beauty Pless, Lurena Joiner, PA-C      PDMP not reviewed this encounter.   Yaneli Keithley, Lurena Joiner, PA-C 04/20/23 1027

## 2023-04-20 NOTE — Discharge Instructions (Signed)
Apply cream twice daily for 1-2 weeks Keep area clean and dry. Always pat dry after a shower!

## 2023-05-01 ENCOUNTER — Encounter: Payer: Self-pay | Admitting: Cardiology

## 2023-05-01 ENCOUNTER — Ambulatory Visit: Payer: 59 | Attending: Cardiology | Admitting: Cardiology

## 2023-05-01 VITALS — BP 116/70 | HR 70 | Resp 16 | Ht 61.0 in | Wt 262.4 lb

## 2023-05-01 DIAGNOSIS — D6869 Other thrombophilia: Secondary | ICD-10-CM

## 2023-05-01 DIAGNOSIS — I428 Other cardiomyopathies: Secondary | ICD-10-CM

## 2023-05-01 DIAGNOSIS — Z6841 Body Mass Index (BMI) 40.0 and over, adult: Secondary | ICD-10-CM

## 2023-05-01 DIAGNOSIS — E66813 Obesity, class 3: Secondary | ICD-10-CM

## 2023-05-01 DIAGNOSIS — I5022 Chronic systolic (congestive) heart failure: Secondary | ICD-10-CM | POA: Diagnosis not present

## 2023-05-01 NOTE — Patient Instructions (Addendum)
Medication Instructions:  Your physician recommends that you continue on your current medications as directed. Please refer to the Current Medication list given to you today.  *If you need a refill on your cardiac medications before your next appointment, please call your pharmacy*   Lab Work: none If you have labs (blood work) drawn today and your tests are completely normal, you will receive your results only by: MyChart Message (if you have MyChart) OR A paper copy in the mail If you have any lab test that is abnormal or we need to change your treatment, we will call you to review the results.   Testing/Procedures: Your physician has requested that you have an echocardiogram. To be done in 6 months.  Echocardiography is a painless test that uses sound waves to create images of your heart. It provides your doctor with information about the size and shape of your heart and how well your heart's chambers and valves are working. This procedure takes approximately one hour. There are no restrictions for this procedure. Please do NOT wear cologne, perfume, aftershave, or lotions (deodorant is allowed). Please arrive 15 minutes prior to your appointment time.  Please note: We ask at that you not bring children with you during ultrasound (echo/ vascular) testing. Due to room size and safety concerns, children are not allowed in the ultrasound rooms during exams. Our front office staff cannot provide observation of children in our lobby area while testing is being conducted. An adult accompanying a patient to their appointment will only be allowed in the ultrasound room at the discretion of the ultrasound technician under special circumstances. We apologize for any inconvenience.    Follow-Up: At [facility], you and your health needs are our priority.  As part of our continuing mission to provide you with exceptional heart care, we have created designated Provider Care Teams.  These Care  Teams include your primary Cardiologist (physician) and Advanced Practice Providers (APPs -  Physician Assistants and Nurse Practitioners) who all work together to provide you with the care you need, when you need it.  We recommend signing up for the patient portal called "MyChart".  Sign up information is provided on this After Visit Summary.  MyChart is used to connect with patients for Virtual Visits (Telemedicine).  Patients are able to view lab/test results, encounter notes, upcoming appointments, etc.  Non-urgent messages can be sent to your provider as well.   To learn more about what you can do with MyChart, go to [email or web address].    Your next appointment:   6 month(s)--after echo  Provider:   Yates Decamp, MD

## 2023-05-01 NOTE — Progress Notes (Signed)
Cardiology Office Note:  .   Date:  05/03/2023  ID:  Shelly Shields, DOB [birth year], MRN [id number] PCP: Everlean Cherry, MD   [facility] Providers Cardiologist:  Yates Decamp, MD   History of Present Illness: .   Shelly Shields is a 42 y.o. African-American female with hypertension, hyperlipidemia, uncontrolled diabetes mellitus with stage IIIa chronic kidney disease, morbid obesity and obstructive sleep apnea on CPAP and history of Covid pneumonia in January 2021 during which time she had pulmonary embolism. 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  She has been on anticoagulation chronically with Eliquis.  She underwent BiV ICD implantation on 05/22/2022. This is a 6 month visit.  Discussed the use of AI scribe software for clinical note transcription with the patient, who gave verbal consent to proceed.  History of Present Illness   Shelly Shields, a patient with a history of cardiomyopathy, heart failure, and diabetes, presents for a routine follow-up. She recently underwent trigger finger surgery and is currently off work. She reports a slight weight gain but does not quantify the amount. She confirms adherence to her prescribed medications, including maximum dose Entresto twice daily. She does not monitor her blood pressure at home but has recently started seeing an endocrinologist for better diabetes control. She also mentions occasional difficulty using her sleep apnea device due to her hair. She reports occasional leg swelling but does not elaborate on the frequency or severity. Shelly Shields also mentions a history of blood clots in 2021, possibly related to a COVID-19 infection. She is currently on apixaban for this issue. She also discusses her struggle with weight loss and her reluctance to consider bariatric surgery.      Review of Systems  Cardiovascular:  Positive for dyspnea on  exertion. Negative for chest pain and leg swelling.    Labs   Lab Results  Component Value Date   CHOL 176 03/28/2020   HDL 29 (L) 03/28/2020   LDLCALC 126 (H) 03/28/2020   TRIG 106 03/28/2020   CHOLHDL 6.1 03/28/2020   Lab Results  Component Value Date   NA 139 12/27/2022   K 3.9 12/27/2022   CO2 21 (L) 12/27/2022   GLUCOSE 470 (H) 12/27/2022   BUN 13 12/27/2022   CREATININE 1.25 (H) 12/27/2022   CALCIUM 9.2 12/27/2022   EGFR 63 11/21/2022   GFRNONAA 55 (L) 12/27/2022      Latest Ref Rng & Units 12/27/2022    5:02 PM 12/27/2022    4:58 PM 11/21/2022   11:21 AM  BMP  Glucose 70 - 99 mg/dL  086  578   BUN 6 - 20 mg/dL  13  16   Creatinine 4.69 - 1.00 mg/dL  6.29  5.28   BUN/Creat Ratio 9 - 23   14   Sodium 135 - 145 mmol/L 139  138  141   Potassium 3.5 - 5.1 mmol/L 3.9  4.0  4.1   Chloride 98 - 111 mmol/L  107  110   CO2 22 - 32 mmol/L  21  19   Calcium 8.9 - 10.3 mg/dL  9.2  8.9       Latest Ref Rng & Units 12/27/2022    5:02 PM 12/27/2022    4:58 PM 10/21/2022    3:51 PM  CBC  WBC 4.0 - 10.5 K/uL  8.1  11.0   Hemoglobin 12.0 - 15.0 g/dL 41.3  24.4  13.9  Hematocrit 36.0 - 46.0 % 37.0  38.7  44.0   Platelets 150 - 400 K/uL  207  232     Physical Exam:   VS:  BP 116/70 (BP Location: Right Arm, Patient Position: Sitting, Cuff Size: Large)   Pulse 70   Resp 16   Ht 5\' 1"  (1.549 m)   Wt 262 lb 6.4 oz (119 kg)   SpO2 96%   BMI 49.58 kg/m    Wt Readings from Last 3 Encounters:  05/01/23 262 lb 6.4 oz (119 kg)  01/30/23 268 lb (121.6 kg)  11/01/22 269 lb (122 kg)     Physical Exam Constitutional:      Appearance: She is morbidly obese.  Neck:     Vascular: No carotid bruit or JVD.  Cardiovascular:     Rate and Rhythm: Normal rate and regular rhythm.     Pulses: Intact distal pulses.     Heart sounds: Normal heart sounds. No murmur heard.    No gallop.  Pulmonary:     Effort: Pulmonary effort is normal.     Breath sounds: Normal breath sounds.   Abdominal:     General: Bowel sounds are normal.     Palpations: Abdomen is soft.  Musculoskeletal:     Right lower leg: No edema.     Left lower leg: No edema.     Studies Reviewed: .    Cardiac MR 05/03/2022: Moderate left ventricular enlargement and global hypokinesis, EF 34%.  Estimated cardiac output 4.4 L/min. Normal RV size and function, RVEF 51%. Normal cardiac valves. Trivial pericardial effusion. No late gadolinium uptake.   Echocardiogram 12/02/2022: Limited study. Left ventricle cavity is moderately dilated. Mild concentric hypertrophy of the left ventricle. Visualization of LV endocardium is difficult. Severe global hypokinesis. LVEF probably 30-35%. Inadequate Doppler data to assess diastolic function. Compared to previous echocardiogram in 2022, LV cavity dilation is new.  EKG:    EKG 10/21/2022: Atrially sensed, BiV paced rhythm at rate of 93 bpm.  Medications and allergies    Allergies  Allergen Reactions   Farxiga [Dapagliflozin] Other (See Comments)    Recurrent yeast infections   Ibuprofen Hives and Rash    Other Reaction(s): Other (See Comments)  asthma    Asthma asthma    Asthma Asthma asthma asthma    Asthma   Ketorolac Hives and Rash    Causes asthma attack    Causes asthma attack Causes asthma attack   Lisinopril Cough    Other Reaction(s): Cough, Other (See Comments)   Metformin Diarrhea     Current Outpatient Medications:    apixaban (ELIQUIS) 5 MG TABS tablet, Take 1 tablet (5 mg total) by mouth 2 (two) times daily. Resume on 11/2 morning, Disp: 60 tablet, Rfl: 0   Baclofen 5 MG TABS, Take 5-10 mg by mouth daily as needed (Migraines)., Disp: , Rfl:    Blood Glucose Monitoring Suppl (ACCU-CHEK NANO SMARTVIEW) W/DEVICE KIT, 1 Device by Does not apply route 4 (four) times daily - after meals and at bedtime., Disp: 1 kit, Rfl: 0   butalbital-acetaminophen-caffeine (FIORICET) 50-325-40 MG tablet, Take 1 tablet by mouth every 6 (six)  hours as needed for headache., Disp: , Rfl:    carvedilol (COREG) 25 MG tablet, TAKE 2 TABLETS(50 MG) BY MOUTH TWICE DAILY WITH A MEAL, Disp: 180 tablet, Rfl: 3   Continuous Blood Gluc Transmit (DEXCOM G6 TRANSMITTER) MISC, See admin instructions., Disp: , Rfl:    Continuous Glucose Sensor (DEXCOM G7 SENSOR)  MISC, by Does not apply route., Disp: , Rfl:    Cyanocobalamin (B-12) 1000 MCG SUBL, Place 1,000 mcg under the tongue daily., Disp: , Rfl:    ENTRESTO 97-103 MG, TAKE 1 TABLET BY MOUTH TWICE DAILY, Disp: 180 tablet, Rfl: 1   escitalopram (LEXAPRO) 10 MG tablet, Take 10 mg by mouth daily., Disp: , Rfl:    fluticasone (FLONASE) 50 MCG/ACT nasal spray, Place 2 sprays into both nostrils daily as needed for allergies. , Disp: , Rfl: 3   gabapentin (NEURONTIN) 300 MG capsule, Take 600 mg by mouth 2 (two) times daily., Disp: , Rfl:    glucose blood (ACCU-CHEK SMARTVIEW) test strip, Check sugar 6 x daily (Patient taking differently: Check sugar 6 x daily. Uses true test instead), Disp: 200 each, Rfl: 3   glycopyrrolate (ROBINUL) 1 MG tablet, Take 1 mg by mouth 3 (three) times daily as needed (stomach acid)., Disp: , Rfl:    HUMALOG KWIKPEN 200 UNIT/ML KwikPen, Inject 10-50 Units into the skin See admin instructions. Via Insulin Pump, Disp: , Rfl:    hydrOXYzine (ATARAX) 10 MG tablet, Take 1 tablet (10 mg total) by mouth 3 (three) times daily as needed. (Patient taking differently: Take 10 mg by mouth 3 (three) times daily as needed for itching or anxiety.), Disp: 30 tablet, Rfl: 0   Insulin Disposable Pump (OMNIPOD DASH PODS, GEN 4,) MISC, 200 Units every other day. Humalog, Disp: , Rfl:    Insulin Pen Needle (INSUPEN PEN NEEDLES) 32G X 4 MM MISC, BD Pen Needles- brand specific. Inject insulin via insulin pen daily, Disp: 200 each, Rfl: 3   ivabradine (CORLANOR) 7.5 MG TABS tablet, Take 1 tablet (7.5 mg total) by mouth 2 (two) times daily with a meal., Disp: 180 tablet, Rfl: 1   Lancet Devices (ACCU-CHEK  SOFTCLIX) lancets, Use as instructed for blood glucose checks four times daily, before meals and at bedtime, Disp: 100 each, Rfl: 5   levocetirizine (XYZAL) 5 MG tablet, Take 5 mg by mouth every evening., Disp: , Rfl:    medroxyPROGESTERone (DEPO-PROVERA) 150 MG/ML injection, Inject 150 mg into the muscle every 3 (three) months., Disp: , Rfl:    montelukast (SINGULAIR) 10 MG tablet, Take 10 mg by mouth at bedtime., Disp: , Rfl:    Multiple Vitamin (MULTI-VITAMIN) tablet, Take 1 tablet by mouth daily., Disp: , Rfl:    nortriptyline (PAMELOR) 10 MG capsule, Take 20 mg by mouth at bedtime., Disp: , Rfl:    NURTEC 75 MG TBDP, Take 75 mg by mouth daily as needed (migraines)., Disp: , Rfl:    nystatin cream (MYCOSTATIN), Apply to affected area 2 times daily, Disp: 30 g, Rfl: 0   ondansetron (ZOFRAN-ODT) 4 MG disintegrating tablet, Take 1 tablet (4 mg total) by mouth every 8 (eight) hours as needed for nausea or vomiting., Disp: 20 tablet, Rfl: 0   pantoprazole (PROTONIX) 40 MG tablet, Take 40 mg by mouth daily., Disp: , Rfl:    rosuvastatin (CRESTOR) 20 MG tablet, Take 1 tablet (20 mg total) by mouth daily at 6 PM., Disp: 30 tablet, Rfl: 0   spironolactone (ALDACTONE) 25 MG tablet, Take 1 tablet (25 mg total) by mouth daily., Disp: 90 tablet, Rfl: 3   topiramate (TOPAMAX) 100 MG tablet, Take 200 mg by mouth daily., Disp: , Rfl:    torsemide (DEMADEX) 20 MG tablet, Take 1 tablet (20 mg total) by mouth daily. One tab daily in morning. Take additional in afternoon if weight up > 3  Lbs in 3 days, Disp: , Rfl:    TRUEPLUS LANCETS 33G MISC, USE TO CHECK BLOOD GLUCOSE QID - AFTER MEALS AND AT BEDTIME, Disp: , Rfl: 3   VENTOLIN HFA 108 (90 BASE) MCG/ACT inhaler, Use as directed 2 puffs in the mouth or throat 4 (four) times daily as needed for shortness of breath. , Disp: , Rfl: 3   potassium chloride (KLOR-CON) 10 MEQ tablet, Take 1 tablet (10 mEq total) by mouth daily., Disp: 90 tablet, Rfl: 3   ASSESSMENT AND  PLAN: .      ICD-10-CM   1. Non-ischemic cardiomyopathy (HCC)  I42.8 ECHOCARDIOGRAM COMPLETE    2. Chronic systolic heart failure (HCC)  Z61.09 ECHOCARDIOGRAM COMPLETE    3. Secondary hypercoagulable state (HCC)  D68.69     4. Class 3 severe obesity due to excess calories with serious comorbidity and body mass index (BMI) of 50.0 to 59.9 in adult [facility])  U04.540    Z68.43    E66.01       Assessment and Plan    Cardiomyopathy and Heart Failure with reduced LVEF. Stable on maximum dose of Entresto and Carvedilol. No recent weight changes or leg swelling. -Continue current medications:Entresto 27/103mg  twice daily, Carvedilol 25mg  twice daily, Spironolactone 25mg  daily, Furosemide daily. -Repeat echocardiogram in 4 weeks.  History of Blood Clots (Covid pneumonia in January 2021 during which time she had pulmonary embolism. 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 Discussed the risk/benefit of continuing anticoagulation. -Continue Apixaban.  Morbid Obesity No significant weight loss despite previous discussions. Discussed the potential benefits of bariatric surgery, but patient is not currently considering this option. -Encouraged to consider bariatric surgery.  Diabetes Patient recently started seeing an endocrinologist for diabetes management. No current use of Actos due to potential fluid retention. -Continue management with endocrinologist.  Follow-up -Return to clinic in 6 months. -Consider bariatric surgery for weight loss.      Yates Decamp, MD, [facility] 05/03/2023, 5:23 AM [facility] [address] [place], [place] [address] Phone: [phone]. Fax:  [phone]

## 2023-07-01 ENCOUNTER — Other Ambulatory Visit: Payer: Self-pay | Admitting: Cardiology

## 2023-07-01 DIAGNOSIS — I5022 Chronic systolic (congestive) heart failure: Secondary | ICD-10-CM

## 2023-07-01 DIAGNOSIS — I420 Dilated cardiomyopathy: Secondary | ICD-10-CM

## 2023-08-19 ENCOUNTER — Other Ambulatory Visit: Payer: Self-pay

## 2023-08-19 DIAGNOSIS — I5022 Chronic systolic (congestive) heart failure: Secondary | ICD-10-CM

## 2023-08-19 MED ORDER — POTASSIUM CHLORIDE ER 10 MEQ PO TBCR: 10.0000 meq | EXTENDED_RELEASE_TABLET | Freq: Every day | ORAL | 2 refills | Status: AC

## 2023-08-20 ENCOUNTER — Ambulatory Visit

## 2023-08-20 DIAGNOSIS — I428 Other cardiomyopathies: Secondary | ICD-10-CM | POA: Diagnosis not present

## 2023-08-20 LAB — CUP PACEART REMOTE DEVICE CHECK
Battery Remaining Longevity: 61 mo
Battery Remaining Percentage: 78 %
Battery Voltage: 2.99 V
Brady Statistic AP VP Percent: 2.5 %
Brady Statistic AP VS Percent: 1 %
Brady Statistic AS VP Percent: 97 %
Brady Statistic AS VS Percent: 1 %
Brady Statistic RA Percent Paced: 2.4 %
Date Time Interrogation Session: 20250326020020
HighPow Impedance: 78 Ohm
HighPow Impedance: 78 Ohm
Implantable Lead Connection Status: 753985
Implantable Lead Connection Status: 753985
Implantable Lead Connection Status: 753985
Implantable Lead Implant Date: 20231227
Implantable Lead Implant Date: 20231227
Implantable Lead Implant Date: 20231227
Implantable Lead Location: 753858
Implantable Lead Location: 753859
Implantable Lead Location: 753860
Implantable Lead Model: 3830
Implantable Lead Model: 7122
Implantable Pulse Generator Implant Date: 20231227
Lead Channel Impedance Value: 380 Ohm
Lead Channel Impedance Value: 480 Ohm
Lead Channel Impedance Value: 510 Ohm
Lead Channel Pacing Threshold Amplitude: 0.75 V
Lead Channel Pacing Threshold Amplitude: 0.75 V
Lead Channel Pacing Threshold Amplitude: 0.75 V
Lead Channel Pacing Threshold Pulse Width: 0.5 ms
Lead Channel Pacing Threshold Pulse Width: 0.5 ms
Lead Channel Pacing Threshold Pulse Width: 0.5 ms
Lead Channel Sensing Intrinsic Amplitude: 11.7 mV
Lead Channel Sensing Intrinsic Amplitude: 2.9 mV
Lead Channel Setting Pacing Amplitude: 2 V
Lead Channel Setting Pacing Amplitude: 2.5 V
Lead Channel Setting Pacing Amplitude: 2.5 V
Lead Channel Setting Pacing Pulse Width: 0.5 ms
Lead Channel Setting Pacing Pulse Width: 0.5 ms
Lead Channel Setting Sensing Sensitivity: 0.5 mV
Pulse Gen Serial Number: 5556392
Zone Setting Status: 755011

## 2023-08-26 ENCOUNTER — Other Ambulatory Visit: Payer: Self-pay | Admitting: Cardiology

## 2023-08-26 DIAGNOSIS — I5022 Chronic systolic (congestive) heart failure: Secondary | ICD-10-CM

## 2023-08-26 DIAGNOSIS — I420 Dilated cardiomyopathy: Secondary | ICD-10-CM

## 2023-09-02 ENCOUNTER — Encounter: Payer: Self-pay | Admitting: Cardiology

## 2023-09-06 ENCOUNTER — Encounter: Payer: Self-pay | Admitting: Internal Medicine

## 2023-09-19 ENCOUNTER — Telehealth: Payer: Self-pay

## 2023-09-19 NOTE — Telephone Encounter (Signed)
 1st attempt to reach pt regarding surgical clearance and the need for an TELE appointment.  Left pt a detailed message to call back and get that scheduled.

## 2023-09-19 NOTE — Telephone Encounter (Signed)
 Patient with diagnosis of stroke, renal infarct and PE on Eliquis  for anticoagulation.    Procedure: Left shoulder arthroscopy biceps tendonesis versus tenotony  Date of procedure: TBD  History of Covid pneumonia in January 2021 during which time she had pulmonary embolism. 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 Patient's hypercoagulabilty workup was negative.   CrCl 88 ml/min Platelet count 186  Per clearance from- general anesthesia  Per office protocol, patient can hold Eliquis  for 2 days prior to procedure.    She should resume as soon as safely possible.  **This guidance is not considered finalized until pre-operative APP has relayed final recommendations.**

## 2023-09-19 NOTE — Telephone Encounter (Signed)
   Name: Shelly Shields  DOB: Apr 03, 1981  MRN: [id number]  Primary Cardiologist: Knox Perl, MD   Preoperative team, please contact this patient and set up a phone call appointment for further preoperative risk assessment. Please obtain consent and complete medication review. Thank you for your help.  I confirm that guidance regarding antiplatelet and oral anticoagulation therapy has been completed and, if necessary, noted below.  Per office protocol, patient can hold Eliquis  for 2 days prior to procedure.    I also confirmed the patient resides in the state of [place] . As per [facility] Medical Board telemedicine laws, the patient must reside in the state in which the provider is licensed.   Carie Charity, NP 09/19/2023, 2:17 PM [facility]

## 2023-09-19 NOTE — Telephone Encounter (Signed)
   Pre-operative Risk Assessment    Patient Name: Shelly Shields  DOB: 1980/08/12 MRN: [id number]   Date of last office visit: 05/01/23 Date of next office visit: Not scheduled   Request for Surgical Clearance    Procedure:   Left shoulder arthroscopy biceps tendonesis versus tenotony  Date of Surgery:  Clearance TBD                                Surgeon:  Marcelino Sera, MD Surgeon's Group or Practice Name:  [facility] Phone number:  [phone] Fax number:  [phone]   Type of Clearance Requested:   - Medical    Type of Anesthesia:  General    Additional requests/questions:    Gardiner Jumper   09/19/2023, 12:12 PM

## 2023-09-23 NOTE — Telephone Encounter (Signed)
 Pt returning call in regards to setting up a tele visit. Please advise

## 2023-09-25 ENCOUNTER — Telehealth: Payer: Self-pay

## 2023-09-25 NOTE — Telephone Encounter (Signed)
 Spoke with patient who is agreeable to do a tele visit on 5/2 at 9:40 am. Med rec and consent have been done.

## 2023-09-25 NOTE — Telephone Encounter (Signed)
  Patient Consent for Virtual Visit        Shelly Shields has provided verbal consent on 09/25/2023 for a virtual visit (video or telephone).   CONSENT FOR VIRTUAL VISIT FOR:  Shelly Shields  By participating in this virtual visit I agree to the following:  I hereby voluntarily request, consent and authorize [facility] and its employed or contracted physicians, physician assistants, nurse practitioners or other licensed health care professionals (the Practitioner), to provide me with telemedicine health care services (the "Services") as deemed necessary by the treating Practitioner. I acknowledge and consent to receive the Services by the Practitioner via telemedicine. I understand that the telemedicine visit will involve communicating with the Practitioner through live audiovisual communication technology and the disclosure of certain medical information by electronic transmission. I acknowledge that I have been given the opportunity to request an in-person assessment or other available alternative prior to the telemedicine visit and am voluntarily participating in the telemedicine visit.  I understand that I have the right to withhold or withdraw my consent to the use of telemedicine in the course of my care at any time, without affecting my right to future care or treatment, and that the Practitioner or I may terminate the telemedicine visit at any time. I understand that I have the right to inspect all information obtained and/or recorded in the course of the telemedicine visit and may receive copies of available information for a reasonable fee.  I understand that some of the potential risks of receiving the Services via telemedicine include:  Delay or interruption in medical evaluation due to technological equipment failure or disruption; Information transmitted may not be sufficient (e.g. poor resolution of images) to allow for appropriate medical decision making by the  Practitioner; and/or  In rare instances, security protocols could fail, causing a breach of personal health information.  Furthermore, I acknowledge that it is my responsibility to provide information about my medical history, conditions and care that is complete and accurate to the best of my ability. I acknowledge that Practitioner's advice, recommendations, and/or decision may be based on factors not within their control, such as incomplete or inaccurate data provided by me or distortions of diagnostic images or specimens that may result from electronic transmissions. I understand that the practice of medicine is not an exact science and that Practitioner makes no warranties or guarantees regarding treatment outcomes. I acknowledge that a copy of this consent can be made available to me via my patient portal [facility] MyChart), or I can request a printed copy by calling the office of [facility].    I understand that my insurance will be billed for this visit.   I have read or had this consent read to me. I understand the contents of this consent, which adequately explains the benefits and risks of the Services being provided via telemedicine.  I have been provided ample opportunity to ask questions regarding this consent and the Services and have had my questions answered to my satisfaction. I give my informed consent for the services to be provided through the use of telemedicine in my medical care

## 2023-09-26 ENCOUNTER — Ambulatory Visit: Attending: Cardiology

## 2023-09-26 DIAGNOSIS — Z01810 Encounter for preprocedural cardiovascular examination: Secondary | ICD-10-CM | POA: Diagnosis not present

## 2023-09-26 NOTE — Progress Notes (Signed)
 Virtual Visit via Telephone Note   Because of Shelly Shields co-morbid illnesses, she is at least at moderate risk for complications without adequate follow up.  This format is felt to be most appropriate for this patient at this time.  Due to technical limitations with video connection (technology), today's appointment will be conducted as an audio only telehealth visit, and Shelly Shields verbally agreed to proceed in this manner.   All issues noted in this document were discussed and addressed.  No physical exam could be performed with this format.  Evaluation Performed:  Preoperative cardiovascular risk assessment _____________   Date:  09/26/2023   Patient ID:  Shelly Shields, DOB 03/19/1981, MRN [id number] Patient Location:  Home Provider location:   Office  Primary Care Provider:  Karena Ota, MD Primary Cardiologist:  Knox Perl, MD  Chief Complaint / Patient Profile  43 y.o. 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  Who is pending left shoulder arthroscopy biceps tenodesis versus tenotony and presents today for telephonic preoperative cardiovascular risk assessment. History of Present Illness  Shelly Shields is a 43 y.o. female who presents via Web designer for a telehealth visit today.  Pt was last seen in cardiology clinic on 05/01/23 by Dr. Berry Bristol.  At that time Shelly Shields was doing well.  The patient is now pending procedure as outlined above. Since her last visit, she has remained stable from a cardiac standpoint.  Today she denies chest pain, shortness of breath, lower extremity edema,  fatigue, palpitations, melena, hematuria, hemoptysis, diaphoresis, weakness, presyncope, syncope, orthopnea, and PND.  She is able to achieve greater than 4 METS of activity. Past Medical History    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 05/22/2022 05/22/2022   Migraines    Obesity    Pulmonary embolism (HCC) 05/2019   with COVID   Sleep apnea    Past Surgical History:  Procedure Laterality Date   BILATERAL CARPAL TUNNEL RELEASE     BIV ICD INSERTION CRT-D N/A 05/22/2022   Procedure: BIV ICD INSERTION CRT-D;  Surgeon: Verona Goodwill, MD;  Location: [facility] INVASIVE CV LAB;  Service: Cardiovascular;  Laterality: N/A;   LEAD REVISION/REPAIR N/A 05/22/2022   Procedure: LEAD REVISION/REPAIR;  Surgeon: Verona Goodwill, MD;  Location: [facility] INVASIVE CV LAB;  Service: Cardiovascular;  Laterality: N/A;   LEFT HEART CATH AND CORONARY ANGIOGRAPHY N/A 03/27/2021   Procedure: LEFT HEART CATH AND CORONARY ANGIOGRAPHY;  Surgeon: Cody Das, MD;  Location: [facility] INVASIVE CV LAB;  Service: Cardiovascular;  Laterality: N/A;   TEE WITHOUT CARDIOVERSION N/A 03/28/2020   Procedure: TRANSESOPHAGEAL ECHOCARDIOGRAM (TEE);  Surgeon: Knox Perl, MD;  Location: [facility] ENDOSCOPY;  Service: Cardiovascular;  Laterality: N/A;   triger finger release     Allergies Allergies  Allergen Reactions   Farxiga [Dapagliflozin] Other (See Comments)    Recurrent yeast infections  Ibuprofen Hives and Rash    Other Reaction(s): Other (See Comments)  asthma    Asthma asthma    Asthma Asthma asthma asthma    Asthma   Ketorolac Hives and Rash    Causes asthma attack    Causes asthma attack Causes  asthma attack   Lisinopril Cough    Other Reaction(s): Cough, Other (See Comments)   Metformin  Diarrhea   Home Medications    Prior to Admission medications   Medication Sig Start Date End Date Taking? Authorizing Provider  apixaban  (ELIQUIS ) 5 MG TABS tablet Take 1 tablet (5 mg total) by mouth 2 (two) times daily. Resume on 11/2 morning 03/27/21   Patwardhan, Kaye Parsons, MD  Baclofen 5 MG TABS Take 5-10 mg by mouth daily as needed (Migraines). 08/09/19   [provider]  Blood Glucose Monitoring Suppl (ACCU-CHEK NANO SMARTVIEW) W/DEVICE KIT 1 Device by Does not apply route 4 (four) times daily - after meals and at bedtime. 11/29/14   Adamo, Elena M, MD  butalbital -acetaminophen -caffeine  (FIORICET ) 50-325-40 MG tablet Take 1 tablet by mouth every 6 (six) hours as needed for headache. 12/11/21   [provider]  carvedilol  (COREG ) 25 MG tablet TAKE 2 TABLETS(50 MG) BY MOUTH TWICE DAILY WITH A MEAL 07/03/23   Knox Perl, MD  Continuous Blood Gluc Transmit (DEXCOM G6 TRANSMITTER) MISC See admin instructions. 08/16/20   [provider]  Continuous Glucose Sensor (DEXCOM G7 SENSOR) MISC by Does not apply route.    [provider]  Cyanocobalamin (B-12) 1000 MCG SUBL Place 1,000 mcg under the tongue daily.    [provider]  escitalopram  (LEXAPRO ) 10 MG tablet Take 10 mg by mouth daily. 03/01/20   [provider]  fluticasone (FLONASE) 50 MCG/ACT nasal spray Place 2 sprays into both nostrils daily as needed for allergies.  09/22/14   [provider]  gabapentin (NEURONTIN) 300 MG capsule Take 600 mg by mouth 2 (two) times daily. 02/22/22   [provider]  glucose blood (ACCU-CHEK SMARTVIEW) test strip Check sugar 6 x daily Patient taking differently: Check sugar 6 x daily. Uses true test instead 11/29/14   Adamo, Sherrin Dollar, MD  glycopyrrolate (ROBINUL) 1 MG tablet Take 1 mg by mouth 3 (three) times daily as needed (stomach acid). 09/12/22    [provider]  HUMALOG KWIKPEN 200 UNIT/ML KwikPen Inject 10-50 Units into the skin See admin instructions. Via Insulin  Pump 09/06/21   [provider]  hydrOXYzine  (ATARAX ) 10 MG tablet Take 1 tablet (10 mg total) by mouth 3 (three) times daily as needed. Patient taking differently: Take 10 mg by mouth 3 (three) times daily as needed for itching or anxiety. 10/24/22   Knox Perl, MD  Insulin  Disposable Pump (OMNIPOD DASH PODS, GEN 4,) MISC 200 Units every other day. Humalog 02/23/21   [provider]  Insulin  Pen Needle (INSUPEN PEN NEEDLES) 32G X 4 MM MISC BD Pen Needles- brand specific. Inject insulin  via insulin  pen daily 11/29/14   Adamo, Elena M, MD  ivabradine  (CORLANOR) 7.5 MG TABS tablet Take 1 tablet (7.5 mg total) by mouth 2 (two) times daily with a meal. 08/26/23   Knox Perl, MD  Lancet Devices [facility]) lancets Use as instructed for blood glucose checks four times daily, before meals and at bedtime 11/29/14   Adamo, Elena M, MD  levocetirizine (XYZAL) 5 MG tablet Take 5 mg by mouth every evening.    [provider]  medroxyPROGESTERone (DEPO-PROVERA) 150 MG/ML  injection Inject 150 mg into the muscle every 3 (three) months.    [provider]  montelukast (SINGULAIR) 10 MG tablet Take 10 mg by mouth at bedtime.    [provider]  Multiple Vitamin (MULTI-VITAMIN) tablet Take 1 tablet by mouth daily.    [provider]  nortriptyline  (PAMELOR ) 10 MG capsule Take 20 mg by mouth at bedtime.    [provider]  NURTEC 75 MG TBDP Take 75 mg by mouth daily as needed (migraines). 04/02/21   [provider]  nystatin  cream (MYCOSTATIN ) Apply to affected area 2 times daily 04/20/23   Rising, Ivette Marks, PA-C  ondansetron  (ZOFRAN -ODT) 4 MG disintegrating tablet Take 1 tablet (4 mg total) by mouth every 8 (eight) hours as needed for nausea or vomiting. 09/09/21   Lucina Sabal, PA-C  pantoprazole  (PROTONIX ) 40 MG tablet  Take 40 mg by mouth daily.    [provider]  potassium chloride  (KLOR-CON ) 10 MEQ tablet Take 1 tablet (10 mEq total) by mouth daily. 08/19/23 11/17/23  Knox Perl, MD  rosuvastatin  (CRESTOR ) 20 MG tablet Take 1 tablet (20 mg total) by mouth daily at 6 PM. 03/30/20   Elgergawy, Ardia Kraft, MD  sacubitril -valsartan  (ENTRESTO ) 97-103 MG TAKE 1 TABLET BY MOUTH TWICE DAILY 07/03/23   Knox Perl, MD  spironolactone  (ALDACTONE ) 25 MG tablet Take 1 tablet (25 mg total) by mouth daily. 10/04/22   Patwardhan, Kaye Parsons, MD  topiramate  (TOPAMAX ) 100 MG tablet Take 200 mg by mouth daily.    [provider]  torsemide  (DEMADEX ) 20 MG tablet Take 1 tablet (20 mg total) by mouth daily. One tab daily in morning. Take additional in afternoon if weight up > 3 Lbs in 3 days 12/12/22   Knox Perl, MD  TRUEPLUS LANCETS 33G MISC USE TO CHECK BLOOD GLUCOSE QID - AFTER MEALS AND AT BEDTIME 12/22/14   [provider]  VENTOLIN  HFA 108 (90 BASE) MCG/ACT inhaler Use as directed 2 puffs in the mouth or throat 4 (four) times daily as needed for shortness of breath.  12/21/14   [provider]   Physical Exam  Vital Signs:  Shelly Shields does not have vital signs available for review today. Given telephonic nature of communication, physical exam is limited. AAOx3. NAD. Normal affect.  Speech and respirations are unlabored. Accessory Clinical Findings  None Assessment & Plan    1.  Preoperative Cardiovascular Risk Assessment:Left shoulder arthroscopy biceps tendonesis versus tenotony  Shelly Shields's perioperative risk of a major cardiac event is 11% according to the Revised Cardiac Risk Index (RCRI).  Therefore, she is at high risk for perioperative complications.   Her functional capacity is fair at 5.07 METs according to the Duke Activity Status Index (DASI). Recommendations: According to ACC/AHA guidelines, no further cardiovascular testing needed.  The patient may proceed to surgery at  acceptable risk.   Antiplatelet and/or Anticoagulation Recommendations: Per office protocol, patient can hold Eliquis  for 2 days prior to procedure.   She should resume as soon as safely possible.   The patient was advised that if she develops new symptoms prior to surgery to contact our office to arrange for a follow-up visit, and she verbalized understanding.  A copy of request was sent to device clinic, if recommendations are made they will be sent to requesting surgeon.  A copy of this note will be routed to requesting surgeon.  Time:   Today, I have spent 12 minutes with the patient with telehealth technology discussing  medical history, symptoms, and management plan.    Duvid Smalls D Obdulio Mash, NP  09/26/2023, 4:01 PM

## 2023-10-01 ENCOUNTER — Telehealth: Payer: Self-pay | Admitting: Cardiology

## 2023-10-01 NOTE — Telephone Encounter (Signed)
 I will re-fax clearance notes from Katlyn West, NP.

## 2023-10-01 NOTE — Telephone Encounter (Signed)
 Caller Genevia Kern) called to follow-up on status of patient's clearance.

## 2023-10-01 NOTE — Telephone Encounter (Signed)
 I will re-fax clearance notes from Katlyn West, NP.       Note   Sherryle Don B routed conversation to Cv Div Preop Callback1 hour ago (1:05 PM)   Elvan Hamel, Jasmin B1 hour ago (1:05 PM)   JW Caller Genevia Kern) called to follow-up on status of patient's clearance.      Note   [facility] [phone] [id number]  Wilson, Jasmin B

## 2023-10-02 NOTE — Progress Notes (Signed)
 Remote ICD transmission.

## 2023-10-29 ENCOUNTER — Other Ambulatory Visit: Payer: Self-pay | Admitting: Cardiology

## 2023-10-31 ENCOUNTER — Ambulatory Visit (HOSPITAL_COMMUNITY)
Admission: RE | Admit: 2023-10-31 | Discharge: 2023-10-31 | Disposition: A | Discharge status: Home / Self Care | Payer: 59 | Source: Ambulatory Visit | Attending: Internal Medicine | Admitting: Internal Medicine

## 2023-10-31 DIAGNOSIS — I428 Other cardiomyopathies: Secondary | ICD-10-CM | POA: Diagnosis present

## 2023-10-31 DIAGNOSIS — I5022 Chronic systolic (congestive) heart failure: Secondary | ICD-10-CM

## 2023-10-31 LAB — ECHOCARDIOGRAM COMPLETE
Area-P 1/2: 8.92 cm2
S' Lateral: 4.2 cm

## 2023-11-01 ENCOUNTER — Ambulatory Visit: Payer: Self-pay | Admitting: Cardiology

## 2023-11-01 NOTE — Progress Notes (Signed)
 Stable heart function with moderate decrease noted previously. Please continue all your medications

## 2023-11-04 ENCOUNTER — Other Ambulatory Visit: Payer: Self-pay | Admitting: Cardiology

## 2023-11-04 DIAGNOSIS — L299 Pruritus, unspecified: Secondary | ICD-10-CM

## 2023-11-05 ENCOUNTER — Telehealth: Payer: Self-pay

## 2023-11-05 NOTE — Telephone Encounter (Signed)
 Patient was at work and not aware of any issues during inappropriate ATP.   Added onto schedule 11/07/23 @ 8:00 AM to see Creighton Doffing, NP in office for programming.   Patient aware of location, date and time.   Industry request sent via email for assistance.   Patient given ED precautions if shock is received w/ verbal understanding.

## 2023-11-05 NOTE — Telephone Encounter (Signed)
 Please advise on this non Cardiac RX for a pt of Dr. Berry Bristol.

## 2023-11-05 NOTE — Telephone Encounter (Signed)
 Alert received from [employer] for Successful ATP delivered Event occurred 6/9 @ 13:55, 28sec in duration per device, HR 240.  EGM c/w AS/VS with oversensing on the RV lead, ATP is delivered x1 AS/BiV pace resumed.  Route to triage high alert 2 NSVT classified events @ 13:54, 8 & 12sec in duration, showing oversensing as well.  _______________________________________  Attempted to contact patient to inquire activity or any procedures during these dates/time.   Patient may need to be seen in office for reprogramming. Appears RV oversensing.   Attempted to contact patient. No answer, left message to call back.

## 2023-11-06 NOTE — Telephone Encounter (Signed)
Please send to patients PCP

## 2023-11-06 NOTE — Progress Notes (Signed)
 Electrophysiology Office Note:   Date:  11/07/2023  ID:  Shelly Shields, DOB 07-Apr-1981, MRN [id number]  Primary Cardiologist: Knox Perl, MD Primary Heart Failure: None Electrophysiologist: Boyce Byes, MD      History of Present Illness:   Shelly Shields is a 43 y.o. female with h/o HFrEF s/p ICD, HTN, OSA, asthma, DM II, PE, long COVID seen today for acute visit due to concerns for RV oversensing.    Alert received from [employer] that the patient had received successful ATP on 11/03/23 @ 13:55, 28sec in duration per device, HR 240.  EGM c/w AS/VS with oversensing on the RV lead, ATP is delivered x1 AS/BiV pace resumed.  Route to triage high alert 2 NSVT classified events @ 13:54, 8 & 12sec in duration, showing oversensing as well.  Pt reports she feels tired a lot and her LE's swell at times. She denies specific device related complaints.  She had a recent ECHO that showed persistently low EF at 30-35%.   She is pending a left shoulder surgery in [facility] at the end of June.   She denies chest pain, palpitations, dyspnea, PND, orthopnea, nausea, vomiting, dizziness, syncope, edema, weight gain, or early satiety.   Review of systems complete and found to be negative unless listed in HPI.   EP Information / Studies Reviewed:    EKG is ordered today. Personal review as below.  EKG Interpretation Date/Time:  Friday November 07 2023 08:03:54 EDT Ventricular Rate:  69 PR Interval:  176 QRS Duration:  148 QT Interval:  492 QTC Calculation: 527 R Axis:   150  Text Interpretation: Normal sinus rhythm Right axis deviation Non-specific intra-ventricular conduction block Confirmed by Creighton Doffing ([id number]) on 11/07/2023 8:10:14 AM   ICD Interrogation-  reviewed in detail today,  See PACEART report.  Device History: Abbott BiV ICD implanted 05/22/2022 for HFrEF Mixed Lead System  History of appropriate therapy: No, inappropriate ATP for RV oversensing 10/2023.   History of AAD therapy: No            Physical Exam:   VS:  BP 118/70   Pulse 80   Ht 5' 1 (1.549 m)   Wt 262 lb 8 oz (119.1 kg)   SpO2 98%   BMI 49.60 kg/m    Wt Readings from Last 3 Encounters:  11/07/23 262 lb 8 oz (119.1 kg)  05/01/23 262 lb 6.4 oz (119 kg)  01/30/23 268 lb (121.6 kg)     GEN: Well nourished, well developed in no acute distress NECK: No JVD; No carotid bruits CARDIAC: Regular rate and rhythm, no murmurs, rubs, gallops RESPIRATORY:  Clear to auscultation without rales, wheezing or rhonchi  ABDOMEN: Soft, non-tender, non-distended EXTREMITIES:  LE 1+ edema; No deformity   ASSESSMENT AND PLAN:    Chronic systolic dysfunction s/p Abbott CRT-D    -mild edema on exam  -Stable on an appropriate medical regimen -abnormal ICD function -See Pace Art report -in clinic device check showed intermittently brief AV delay of 30-50 ms, alternating with normal AVD, RV sensing 0.42mV and unable to capture in RV (confirmed with surface lead on as well, surface PR normal). CXR review from post implant shows normal lead placement, CXR images reviewed and RV lead has migrated -send pt for STAT 2 V CXR for lead placement > images reviewed and LV lead has been pulled back, will require revision  -consider revision of LV MDT 3830 when changing RV lead given patients age /  limitation for MRI's with mixed lead system  -RV programmed subthreshold, ICD therapies turned off as safety precaution > discussed with patient that she is temporarily not protected from ventricular arrhythmia should they occur, she indicates understanding  -Industry support present for all programming changes   Hypertension  -well controlled on current regimen   Hx PE  -Eliquis  5mg  BID   -appropriately dosed based on age / wt     Disposition:   Follow up with Dr. Marven Slimmer next week to discuss extraction and lead revision  . Transition from Dr. Rodolfo Clan to Dr. Marven Slimmer.    Signed, Creighton Doffing, NP-C,  AGACNP-BC [facility] - Electrophysiology  11/07/2023, 12:43 PM

## 2023-11-07 ENCOUNTER — Ambulatory Visit (HOSPITAL_COMMUNITY)
Admission: RE | Admit: 2023-11-07 | Discharge: 2023-11-07 | Disposition: A | Discharge status: Home / Self Care | Source: Ambulatory Visit | Attending: Pulmonary Disease | Admitting: Pulmonary Disease

## 2023-11-07 ENCOUNTER — Other Ambulatory Visit: Payer: Self-pay

## 2023-11-07 ENCOUNTER — Encounter: Payer: Self-pay | Admitting: Pulmonary Disease

## 2023-11-07 ENCOUNTER — Ambulatory Visit: Attending: Pulmonary Disease | Admitting: Pulmonary Disease

## 2023-11-07 VITALS — BP 118/70 | HR 80 | Ht 61.0 in | Wt 262.5 lb

## 2023-11-07 DIAGNOSIS — D6869 Other thrombophilia: Secondary | ICD-10-CM

## 2023-11-07 DIAGNOSIS — I447 Left bundle-branch block, unspecified: Secondary | ICD-10-CM

## 2023-11-07 DIAGNOSIS — I428 Other cardiomyopathies: Secondary | ICD-10-CM | POA: Insufficient documentation

## 2023-11-07 DIAGNOSIS — L299 Pruritus, unspecified: Secondary | ICD-10-CM

## 2023-11-07 DIAGNOSIS — I5022 Chronic systolic (congestive) heart failure: Secondary | ICD-10-CM | POA: Diagnosis present

## 2023-11-07 DIAGNOSIS — Z95810 Presence of automatic (implantable) cardiac defibrillator: Secondary | ICD-10-CM

## 2023-11-07 LAB — CUP PACEART INCLINIC DEVICE CHECK
Battery Remaining Longevity: 57 mo
Brady Statistic RA Percent Paced: 3.2 %
Brady Statistic RV Percent Paced: 96 %
Date Time Interrogation Session: 20250613124401
HighPow Impedance: 79.875
Implantable Lead Connection Status: 753985
Implantable Lead Connection Status: 753985
Implantable Lead Connection Status: 753985
Implantable Lead Implant Date: 20231227
Implantable Lead Implant Date: 20231227
Implantable Lead Implant Date: 20231227
Implantable Lead Location: 753858
Implantable Lead Location: 753859
Implantable Lead Location: 753860
Implantable Lead Model: 3830
Implantable Lead Model: 7122
Implantable Pulse Generator Implant Date: 20231227
Lead Channel Impedance Value: 350 Ohm
Lead Channel Impedance Value: 475 Ohm
Lead Channel Impedance Value: 512.5 Ohm
Lead Channel Pacing Threshold Amplitude: 0.75 V
Lead Channel Pacing Threshold Amplitude: 0.75 V
Lead Channel Pacing Threshold Amplitude: 1 V
Lead Channel Pacing Threshold Amplitude: 1 V
Lead Channel Pacing Threshold Amplitude: 6 V
Lead Channel Pacing Threshold Amplitude: 6 V
Lead Channel Pacing Threshold Pulse Width: 0.5 ms
Lead Channel Pacing Threshold Pulse Width: 0.5 ms
Lead Channel Pacing Threshold Pulse Width: 0.5 ms
Lead Channel Pacing Threshold Pulse Width: 0.5 ms
Lead Channel Pacing Threshold Pulse Width: 0.5 ms
Lead Channel Pacing Threshold Pulse Width: 0.5 ms
Lead Channel Sensing Intrinsic Amplitude: 0.4 mV
Lead Channel Sensing Intrinsic Amplitude: 3.2 mV
Lead Channel Setting Pacing Amplitude: 2 V
Lead Channel Setting Pacing Amplitude: 2.5 V
Lead Channel Setting Pacing Amplitude: 2.5 V
Lead Channel Setting Pacing Pulse Width: 0.5 ms
Lead Channel Setting Pacing Pulse Width: 0.5 ms
Lead Channel Setting Sensing Sensitivity: 0.3 mV
Pulse Gen Serial Number: 5556392
Zone Setting Status: 755011

## 2023-11-07 NOTE — Patient Instructions (Signed)
 Medication Instructions:  Your physician recommends that you continue on your current medications as directed. Please refer to the Current Medication list given to you today. *If you need a refill on your cardiac medications before your next appointment, please call your pharmacy*   Testing/Procedures: Chest Xray  Follow-Up: At [facility], you and your health needs are our priority.  As part of our continuing mission to provide you with exceptional heart care, our providers are all part of one team.  This team includes your primary Cardiologist (physician) and Advanced Practice Providers or APPs (Physician Assistants and Nurse Practitioners) who all work together to provide you with the care you need, when you need it.  Your next appointment:   1 week(s)  Provider:   Harvie Liner, MD

## 2023-11-08 ENCOUNTER — Ambulatory Visit: Payer: Self-pay | Admitting: Cardiology

## 2023-11-12 ENCOUNTER — Emergency Department (HOSPITAL_COMMUNITY)
Admission: EM | Admit: 2023-11-12 | Discharge: 2023-11-13 | Disposition: A | Discharge status: Home / Self Care | Attending: Emergency Medicine | Admitting: Emergency Medicine

## 2023-11-12 ENCOUNTER — Other Ambulatory Visit: Payer: Self-pay

## 2023-11-12 ENCOUNTER — Emergency Department (HOSPITAL_COMMUNITY)

## 2023-11-12 ENCOUNTER — Encounter (HOSPITAL_COMMUNITY): Payer: Self-pay

## 2023-11-12 DIAGNOSIS — I5020 Unspecified systolic (congestive) heart failure: Secondary | ICD-10-CM | POA: Diagnosis not present

## 2023-11-12 DIAGNOSIS — J45901 Unspecified asthma with (acute) exacerbation: Secondary | ICD-10-CM | POA: Insufficient documentation

## 2023-11-12 DIAGNOSIS — E876 Hypokalemia: Secondary | ICD-10-CM | POA: Insufficient documentation

## 2023-11-12 DIAGNOSIS — Z7901 Long term (current) use of anticoagulants: Secondary | ICD-10-CM | POA: Diagnosis not present

## 2023-11-12 DIAGNOSIS — R600 Localized edema: Secondary | ICD-10-CM | POA: Insufficient documentation

## 2023-11-12 DIAGNOSIS — Z794 Long term (current) use of insulin: Secondary | ICD-10-CM | POA: Insufficient documentation

## 2023-11-12 DIAGNOSIS — J40 Bronchitis, not specified as acute or chronic: Secondary | ICD-10-CM

## 2023-11-12 DIAGNOSIS — R0602 Shortness of breath: Secondary | ICD-10-CM | POA: Diagnosis present

## 2023-11-12 LAB — URINALYSIS, ROUTINE W REFLEX MICROSCOPIC
Bilirubin Urine: NEGATIVE
Glucose, UA: NEGATIVE mg/dL
Hgb urine dipstick: NEGATIVE
Ketones, ur: NEGATIVE mg/dL
Nitrite: NEGATIVE
Protein, ur: NEGATIVE mg/dL
Specific Gravity, Urine: 1.016 (ref 1.005–1.030)
pH: 7 (ref 5.0–8.0)

## 2023-11-12 LAB — COMPREHENSIVE METABOLIC PANEL WITH GFR
ALT: 21 U/L (ref 0–44)
AST: 19 U/L (ref 15–41)
Albumin: 3.6 g/dL (ref 3.5–5.0)
Alkaline Phosphatase: 68 U/L (ref 38–126)
Anion gap: 9 (ref 5–15)
BUN: 9 mg/dL (ref 6–20)
CO2: 24 mmol/L (ref 22–32)
Calcium: 8.9 mg/dL (ref 8.9–10.3)
Chloride: 108 mmol/L (ref 98–111)
Creatinine, Ser: 1.31 mg/dL — ABNORMAL HIGH (ref 0.44–1.00)
GFR, Estimated: 52 mL/min — ABNORMAL LOW (ref >60)
Glucose, Bld: 167 mg/dL — ABNORMAL HIGH (ref 70–99)
Potassium: 3.4 mmol/L — ABNORMAL LOW (ref 3.5–5.1)
Sodium: 141 mmol/L (ref 135–145)
Total Bilirubin: 0.6 mg/dL (ref 0.0–1.2)
Total Protein: 6.8 g/dL (ref 6.5–8.1)

## 2023-11-12 LAB — CBC WITH DIFFERENTIAL/PLATELET
Abs Immature Granulocytes: 0.02 10*3/uL (ref 0.00–0.07)
Basophils Absolute: 0.1 10*3/uL (ref 0.0–0.1)
Basophils Relative: 1 %
Eosinophils Absolute: 0.2 10*3/uL (ref 0.0–0.5)
Eosinophils Relative: 2 %
HCT: 40 % (ref 36.0–46.0)
Hemoglobin: 12.9 g/dL (ref 12.0–15.0)
Immature Granulocytes: 0 %
Lymphocytes Relative: 34 %
Lymphs Abs: 3 10*3/uL (ref 0.7–4.0)
MCH: 29.8 pg (ref 26.0–34.0)
MCHC: 32.3 g/dL (ref 30.0–36.0)
MCV: 92.4 fL (ref 80.0–100.0)
Monocytes Absolute: 0.6 10*3/uL (ref 0.1–1.0)
Monocytes Relative: 6 %
Neutro Abs: 5.2 10*3/uL (ref 1.7–7.7)
Neutrophils Relative %: 57 %
Platelets: 249 10*3/uL (ref 150–400)
RBC: 4.33 MIL/uL (ref 3.87–5.11)
RDW: 12.7 % (ref 11.5–15.5)
WBC: 9 10*3/uL (ref 4.0–10.5)
nRBC: 0 % (ref 0.0–0.2)

## 2023-11-12 LAB — BRAIN NATRIURETIC PEPTIDE: B Natriuretic Peptide: 28.5 pg/mL (ref 0.0–100.0)

## 2023-11-12 LAB — TROPONIN I (HIGH SENSITIVITY): Troponin I (High Sensitivity): 6 ng/L (ref <18)

## 2023-11-12 NOTE — ED Triage Notes (Signed)
 Pt reports shortness of breath x 2 days associated with tiredness and dizziness. She is heart failure pt (EF 35%) and has an AICD but has been turned off last Friday because a lead was disconnected. She reports left upper chest pain but reports she thinks it is related to chronic left shoulder pain. She is speaking in clear complete sentences.

## 2023-11-12 NOTE — ED Provider Triage Note (Signed)
 Emergency Medicine Provider Triage Evaluation Note  Shelly Shields , a 43 y.o. female  was evaluated in triage.  Pt complains of SOB.  Review of Systems  Positive: SOB, left shoulder discomfort Negative: Fever, cough  Physical Exam  BP (!) 142/91 (BP Location: Right Wrist)   Pulse 65   Temp 98 F (36.7 C)   Resp 20   Ht 5' 1 (1.549 m)   Wt 118.8 kg   SpO2 100%   BMI 49.50 kg/m  Gen:   Awake, no distress   Resp:  Normal effort  MSK:   Moves extremities without difficulty  Other:    Medical Decision Making  Medically screening exam initiated at 7:04 PM.  Appropriate orders placed.  Shelly Shields was informed that the remainder of the evaluation will be completed by another provider, this initial triage assessment does not replace that evaluation, and the importance of remaining in the ED until their evaluation is complete.  Patient with history of failure, has defibrillator, SOB for 2-3 days. Legs swelling. Feels fluid overloaded.    Shelly Second, PA-C 11/12/23 1905

## 2023-11-13 LAB — D-DIMER, QUANTITATIVE: D-Dimer, Quant: 0.31 ug{FEU}/mL (ref 0.00–0.50)

## 2023-11-13 LAB — TROPONIN I (HIGH SENSITIVITY): Troponin I (High Sensitivity): 7 ng/L (ref <18)

## 2023-11-13 MED ORDER — DOXYCYCLINE HYCLATE 100 MG PO CAPS: 100.0000 mg | ORAL_CAPSULE | Freq: Two times a day (BID) | ORAL | 0 refills | Status: DC

## 2023-11-13 MED ORDER — PREDNISONE 20 MG PO TABS
20.0000 mg | ORAL_TABLET | Freq: Every day | ORAL | 0 refills | Status: AC
Start: 2023-11-13 — End: 2023-11-17

## 2023-11-13 MED ORDER — IPRATROPIUM-ALBUTEROL 0.5-2.5 (3) MG/3ML IN SOLN
3.0000 mL | Freq: Once | RESPIRATORY_TRACT | Status: AC
  Administered 2023-11-13: 3 mL via RESPIRATORY_TRACT
  Filled 2023-11-13: qty 3

## 2023-11-13 MED ORDER — ALBUTEROL SULFATE (2.5 MG/3ML) 0.083% IN NEBU
2.5000 mg | INHALATION_SOLUTION | Freq: Once | RESPIRATORY_TRACT | Status: AC
  Administered 2023-11-13: 2.5 mg via RESPIRATORY_TRACT
  Filled 2023-11-13: qty 3

## 2023-11-13 NOTE — ED Provider Notes (Signed)
 [place] EMERGENCY DEPARTMENT AT [facility] Provider Note   CSN: [id number] Arrival date & time: 11/12/23  4098     Patient presents with: Shortness of Breath   Shelly Shields is a 43 y.o. female.   Patient presents to the department for evaluation of difficulty breathing.  Patient is concerned because she has a history of congestive heart failure with decreased ejection fraction.  Patient reports that she has an AICD but this week it was discovered that she had a bad lead and the ICD function was turned off.  She feels like her legs are more swollen than usual.  She has been taking her torsemide  and spironolactone .  Patient does report cough for the last couple of days and has a history of asthma as well.  She does have a history of pulmonary embolism, has been taking her Eliquis  as prescribed.       Prior to Admission medications   Medication Sig Start Date End Date Taking? Authorizing Provider  doxycycline  (VIBRAMYCIN ) 100 MG capsule Take 1 capsule (100 mg total) by mouth 2 (two) times daily. 11/13/23  Yes Kennth Vanbenschoten, Marine Sia, MD  predniSONE  (DELTASONE ) 20 MG tablet Take 1 tablet (20 mg total) by mouth daily for 4 days. 11/13/23 11/17/23 Yes Cimone Fahey, Marine Sia, MD  apixaban  (ELIQUIS ) 5 MG TABS tablet Take 1 tablet (5 mg total) by mouth 2 (two) times daily. Resume on 11/2 morning 03/27/21   Patwardhan, Kaye Parsons, MD  Baclofen 5 MG TABS Take 5-10 mg by mouth daily as needed (Migraines). 08/09/19   [provider]  Blood Glucose Monitoring Suppl (ACCU-CHEK NANO SMARTVIEW) W/DEVICE KIT 1 Device by Does not apply route 4 (four) times daily - after meals and at bedtime. 11/29/14   Adamo, Elena M, MD  butalbital -acetaminophen -caffeine  (FIORICET ) 50-325-40 MG tablet Take 1 tablet by mouth every 6 (six) hours as needed for headache. 12/11/21   [provider]  carvedilol  (COREG ) 25 MG tablet TAKE 2 TABLETS(50 MG) BY MOUTH TWICE DAILY WITH A MEAL 07/03/23   Knox Perl, MD  Continuous Blood Gluc Transmit (DEXCOM G6 TRANSMITTER) MISC See admin instructions. 08/16/20   [provider]  Continuous Glucose Sensor (DEXCOM G7 SENSOR) MISC by Does not apply route.    [provider]  Cyanocobalamin (B-12) 1000 MCG SUBL Place 1,000 mcg under the tongue daily.    [provider]  escitalopram  (LEXAPRO ) 10 MG tablet Take 10 mg by mouth daily. 03/01/20   [provider]  fluticasone (FLONASE) 50 MCG/ACT nasal spray Place 2 sprays into both nostrils daily as needed for allergies.  09/22/14   [provider]  gabapentin (NEURONTIN) 300 MG capsule Take 600 mg by mouth 2 (two) times daily. 02/22/22   [provider]  glucose blood (ACCU-CHEK SMARTVIEW) test strip Check sugar 6 x daily 11/29/14   Adamo, Elena M, MD  glycopyrrolate (ROBINUL) 1 MG tablet Take 1 mg by mouth 3 (three) times daily as needed (stomach acid). 09/12/22   [provider]  HUMALOG KWIKPEN 200 UNIT/ML KwikPen Inject 10-50 Units into the skin See admin instructions. Via Insulin  Pump 09/06/21   [provider]  hydrOXYzine  (ATARAX ) 10 MG tablet Take 1 tablet (10 mg total) by mouth 3 (three) times daily as needed. Patient taking differently: Take 10 mg by mouth 3 (three) times daily as needed for itching or anxiety. 10/24/22   Knox Perl, MD  Insulin  Disposable Pump (OMNIPOD DASH PODS, GEN 4,) MISC 200 Units  every other day. Humalog 02/23/21   [provider]  Insulin  Pen Needle (INSUPEN PEN NEEDLES) 32G X 4 MM MISC BD Pen Needles- brand specific. Inject insulin  via insulin  pen daily 11/29/14   Adamo, Elena M, MD  ivabradine  (CORLANOR) 7.5 MG TABS tablet Take 1 tablet (7.5 mg total) by mouth 2 (two) times daily with a meal. 08/26/23   Knox Perl, MD  Lancet Devices [facility]) lancets Use as instructed for blood glucose checks four times daily, before meals and at bedtime 11/29/14   Adamo, Elena M, MD  levocetirizine (XYZAL) 5 MG  tablet Take 5 mg by mouth every evening.    [provider]  medroxyPROGESTERone (DEPO-PROVERA) 150 MG/ML injection Inject 150 mg into the muscle every 3 (three) months.    [provider]  montelukast (SINGULAIR) 10 MG tablet Take 10 mg by mouth at bedtime.    [provider]  Multiple Vitamin (MULTI-VITAMIN) tablet Take 1 tablet by mouth daily.    [provider]  nortriptyline  (PAMELOR ) 10 MG capsule Take 20 mg by mouth at bedtime.    [provider]  NURTEC 75 MG TBDP Take 75 mg by mouth daily as needed (migraines). 04/02/21   [provider]  nystatin  cream (MYCOSTATIN ) Apply to affected area 2 times daily 04/20/23   Rising, Ivette Marks, PA-C  ondansetron  (ZOFRAN -ODT) 4 MG disintegrating tablet Take 1 tablet (4 mg total) by mouth every 8 (eight) hours as needed for nausea or vomiting. 09/09/21   Lucina Sabal, PA-C  pantoprazole  (PROTONIX ) 40 MG tablet Take 40 mg by mouth daily.    [provider]  potassium chloride  (KLOR-CON ) 10 MEQ tablet Take 1 tablet (10 mEq total) by mouth daily. 08/19/23 11/17/23  Knox Perl, MD  rosuvastatin  (CRESTOR ) 20 MG tablet Take 1 tablet (20 mg total) by mouth daily at 6 PM. 03/30/20   Elgergawy, Ardia Kraft, MD  sacubitril -valsartan  (ENTRESTO ) 97-103 MG TAKE 1 TABLET BY MOUTH TWICE DAILY 07/03/23   Knox Perl, MD  spironolactone  (ALDACTONE ) 25 MG tablet TAKE 1 TABLET(25 MG) BY MOUTH DAILY 10/30/23   Knox Perl, MD  topiramate  (TOPAMAX ) 100 MG tablet Take 200 mg by mouth daily.    [provider]  torsemide  (DEMADEX ) 20 MG tablet Take 1 tablet (20 mg total) by mouth daily. One tab daily in morning. Take additional in afternoon if weight up > 3 Lbs in 3 days 12/12/22   Knox Perl, MD  TRUEPLUS LANCETS 33G MISC USE TO CHECK BLOOD GLUCOSE QID - AFTER MEALS AND AT BEDTIME 12/22/14   [provider]  VENTOLIN  HFA 108 (90 BASE) MCG/ACT inhaler Use as directed 2 puffs in the mouth or throat 4 (four) times daily  as needed for shortness of breath.  12/21/14   [provider]    Allergies: Farxiga [dapagliflozin], Ibuprofen, Ketorolac, Lisinopril, and Metformin     Review of Systems  Updated Vital Signs BP 123/84   Pulse 74   Temp 98.2 F (36.8 C)   Resp 18   Ht 5' 1 (1.549 m)   Wt 118.8 kg   SpO2 100%   BMI 49.50 kg/m   Physical Exam Vitals and nursing note reviewed.  Constitutional:      General: She is not in acute distress.    Appearance: She is well-developed.  HENT:     Head: Normocephalic and atraumatic.     Mouth/Throat:     Mouth: Mucous membranes are moist.   Eyes:     General:  Vision grossly intact. Gaze aligned appropriately.     Extraocular Movements: Extraocular movements intact.     Conjunctiva/sclera: Conjunctivae normal.    Cardiovascular:     Rate and Rhythm: Normal rate and regular rhythm.     Pulses: Normal pulses.     Heart sounds: Normal heart sounds, S1 normal and S2 normal. No murmur heard.    No friction rub. No gallop.  Pulmonary:     Effort: Pulmonary effort is normal. No respiratory distress.     Breath sounds: Decreased breath sounds and wheezing present.  Abdominal:     General: Bowel sounds are normal.     Palpations: Abdomen is soft.     Tenderness: There is no abdominal tenderness. There is no guarding or rebound.     Hernia: No hernia is present.   Musculoskeletal:        General: No swelling.     Cervical back: Full passive range of motion without pain, normal range of motion and neck supple. No spinous process tenderness or muscular tenderness. Normal range of motion.     Right lower leg: Edema present.     Left lower leg: Edema present.   Skin:    General: Skin is warm and dry.     Capillary Refill: Capillary refill takes less than 2 seconds.     Findings: No ecchymosis, erythema, rash or wound.   Neurological:     General: No focal deficit present.     Mental Status: She is alert and oriented to person, place, and time.      GCS: GCS eye subscore is 4. GCS verbal subscore is 5. GCS motor subscore is 6.     Cranial Nerves: Cranial nerves 2-12 are intact.     Sensory: Sensation is intact.     Motor: Motor function is intact.     Coordination: Coordination is intact.   Psychiatric:        Attention and Perception: Attention normal.        Mood and Affect: Mood normal.        Speech: Speech normal.        Behavior: Behavior normal.     (all labs ordered are listed, but only abnormal results are displayed) Labs Reviewed  COMPREHENSIVE METABOLIC PANEL WITH GFR - Abnormal; Notable for the following components:      Result Value   Potassium 3.4 (*)    Glucose, Bld 167 (*)    Creatinine, Ser 1.31 (*)    GFR, Estimated 52 (*)    All other components within normal limits  URINALYSIS, ROUTINE W REFLEX MICROSCOPIC - Abnormal; Notable for the following components:   APPearance HAZY (*)    Leukocytes,Ua TRACE (*)    Bacteria, UA RARE (*)    All other components within normal limits  CBC WITH DIFFERENTIAL/PLATELET  BRAIN NATRIURETIC PEPTIDE  D-DIMER, QUANTITATIVE  TROPONIN I (HIGH SENSITIVITY)  TROPONIN I (HIGH SENSITIVITY)    EKG: EKG Interpretation Date/Time:  Wednesday November 12 2023 18:47:02 EDT Ventricular Rate:  66 PR Interval:  174 QRS Duration:  114 QT Interval:  474 QTC Calculation: 496 R Axis:   150  Text Interpretation: Atrial-sensed ventricular-paced rhythm Abnormal ECG When compared with ECG of 07-Nov-2023 08:03, PREVIOUS ECG IS PRESENT Confirmed by Ballard Bongo [phone]) on 11/13/2023 4:55:21 AM  Radiology: [facility] Query Chest 2 View Result Date: 11/12/2023 CLINICAL DATA:  Shortness of breath. EXAM: CHEST - 2 VIEW COMPARISON:  November 07, 2023 FINDINGS: Stable multilead AICD positioning  is noted. The heart size and mediastinal contours are within normal limits. Both lungs are clear. The visualized skeletal structures are unremarkable. IMPRESSION: No active cardiopulmonary disease.  Electronically Signed   By: Virgle Grime M.D.   On: 11/12/2023 19:31     Procedures   Medications Ordered in the ED  ipratropium-albuterol  (DUONEB) 0.5-2.5 (3) MG/3ML nebulizer solution 3 mL (3 mLs Nebulization Given 11/13/23 0516)  albuterol  (PROVENTIL ) (2.5 MG/3ML) 0.083% nebulizer solution 2.5 mg (2.5 mg Nebulization Given 11/13/23 0516)                                    Medical Decision Making Amount and/or Complexity of Data Reviewed External Data Reviewed: ECG. Labs: ordered. Decision-making details documented in ED Course. Radiology: ordered and independent interpretation performed. Decision-making details documented in ED Course. ECG/medicine tests: ordered and independent interpretation performed. Decision-making details documented in ED Course.  Risk Prescription drug management.   Differential Diagnosis considered includes, but not limited to: Asthma exacerbation; Bronchitis; Pneumonia; CHF; ACS; PE  Presents to the emergency department with concerns over possible congestive heart failure exacerbation.  Patient reports that she recently found out that her ICD is not working.  She feels like she is holding some fluid in her legs.  No associated chest pain.  EKG without ischemic changes.  Troponins are negative x 2.  Underwent chest x-ray which does not show any heart failure or any other abnormality.  BNP is double digits, not consistent with acute just of heart failure exacerbation.  With history of PE, this was considered.  She does not have pleuritic chest pain.  She has been compliant with her Eliquis .  D-dimer is negative, doubt recurrent PE.  Examination reveals some diminished breath sounds and she does sound like she has some mild bronchospasm in the upper airways.  She does have a history of asthma.  Breathing improved with albuterol  and Atrovent.  Presentation is most consistent with mild asthma exacerbation, no signs of decompensated heart failure.  She  has cardiology follow-up tomorrow.  Will discharge with low dose prednisone , doxycycline , continue albuterol .  Given return precautions.     Final diagnoses:  Bronchitis  Mild asthma with exacerbation, unspecified whether persistent    ED Discharge Orders          Ordered    doxycycline  (VIBRAMYCIN ) 100 MG capsule  2 times daily        11/13/23 0628    predniSONE  (DELTASONE ) 20 MG tablet  Daily        11/13/23 0628               Ballard Bongo, MD 11/13/23 [phone]

## 2023-11-13 NOTE — Progress Notes (Unsigned)
  Electrophysiology Office Follow up Visit Note:    Date:  11/13/2023   ID:  Shelly Shields, DOB 09/12/1980, MRN [id number]  PCP:  Karena Ota, MD  [facility] Cardiologist:  Knox Perl, MD  [facility] Electrophysiologist:  Boyce Byes, MD    Interval History:     Shelly Shields is a 42 y.o. female who presents for a follow up visit.   She last saw Dr. Rodolfo Clan August 27, 2022.  She has a ICD for nonischemic cardiomyopathy and class III heart failure symptoms.  She had a left bundle area lead added in December 2023.  That  upgrade was complicated by microperforation prompting lead revision.  She also has a history of prior stroke, PE.  She saw Cody Das on November 07, 2023.  That was after she received successful ATP on June 9 for VT with a heart rate about 240 bpm.  Interrogation of the device showed inability to capture on the RV lead.  Chest x-ray performed after the procedure demonstrated gross dislodgment of the ICD lead.  She presents today to discuss system revision given grossly dislodged RV lead.          Past medical, surgical, social and family history were reviewed.  ROS:   Please see the history of present illness.    All other systems reviewed and are negative.  EKGs/Labs/Other Studies Reviewed:    The following studies were reviewed today:          Physical Exam:    VS:  There were no vitals taken for this visit.    Wt Readings from Last 3 Encounters:  11/12/23 262 lb (118.8 kg)  11/07/23 262 lb 8 oz (119.1 kg)  05/01/23 262 lb 6.4 oz (119 kg)     GEN: no distress CARD: RRR, No MRG. ICD pocket well healed. RESP: No IWOB. CTAB.      ASSESSMENT:    1. Non-ischemic cardiomyopathy (HCC)   2. LBBB (left bundle branch block)   3. Chronic systolic heart failure (HCC)   4. Presence of cardiac resynchronization therapy defibrillator (CRT-D)   5. 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  We discussed options for management including abandonment and replacement versus extraction of the lead and revision of her system.  Her system is not MRI compatible because of a mixed system.  Her CRT-D system was implanted in December 2023.  Risks, benefits, alternatives to lead extraction and replacement were discussed in detail with the patient today. The patient understands that the risks include but are not limited to bleeding, infection, pneumothorax, perforation, tamponade, vascular damage, renal failure, MI, stroke, death, severe bleeding requiring emergent open heart surgery and lead dislodgement and wishes to proceed.  We will therefore schedule device implantation at the next available time.  She will need to hold Eliquis  for 3 days prior to the procedure.     Signed, Harvie Liner, MD, [facility], [facility] 11/13/2023 12:14 PM    Electrophysiology [facility]

## 2023-11-14 ENCOUNTER — Ambulatory Visit: Attending: Cardiology | Admitting: Cardiology

## 2023-11-14 VITALS — BP 104/60 | HR 72 | Ht 61.0 in | Wt 262.0 lb

## 2023-11-14 DIAGNOSIS — I428 Other cardiomyopathies: Secondary | ICD-10-CM | POA: Diagnosis not present

## 2023-11-14 DIAGNOSIS — I447 Left bundle-branch block, unspecified: Secondary | ICD-10-CM | POA: Diagnosis not present

## 2023-11-14 DIAGNOSIS — Z95810 Presence of automatic (implantable) cardiac defibrillator: Secondary | ICD-10-CM

## 2023-11-14 DIAGNOSIS — I5022 Chronic systolic (congestive) heart failure: Secondary | ICD-10-CM | POA: Diagnosis not present

## 2023-11-14 DIAGNOSIS — T82120D Displacement of cardiac electrode, subsequent encounter: Secondary | ICD-10-CM

## 2023-11-14 DIAGNOSIS — T82120A Displacement of cardiac electrode, initial encounter: Secondary | ICD-10-CM

## 2023-11-14 NOTE — Patient Instructions (Signed)
 Medication Instructions:  Your physician recommends that you continue on your current medications as directed. Please refer to the Current Medication list given to you today.  *If you need a refill on your cardiac medications before your next appointment, please call your pharmacy*  Lab Work: None ordered.  If you have labs (blood work) drawn today and your tests are completely normal, you will receive your results only by: MyChart Message (if you have MyChart) OR A paper copy in the mail If you have any lab test that is abnormal or we need to change your treatment, we will call you to review the results.  Testing/Procedures: Dr Marven Slimmer is planning for a Lead Extraction with Replacement - we will contact you with a date  Follow-Up: At [facility], you and your health needs are our priority.  As part of our continuing mission to provide you with exceptional heart care, our providers are all part of one team.  This team includes your primary Cardiologist (physician) and Advanced Practice Providers or APPs (Physician Assistants and Nurse Practitioners) who all work together to provide you with the care you need, when you need it.  Your next appointment:   To be scheduled

## 2023-11-15 ENCOUNTER — Encounter: Payer: Self-pay | Admitting: Cardiology

## 2023-11-18 ENCOUNTER — Telehealth: Payer: Self-pay

## 2023-11-18 DIAGNOSIS — I428 Other cardiomyopathies: Secondary | ICD-10-CM

## 2023-11-18 NOTE — Telephone Encounter (Signed)
 Pt is scheduled for RV ICD lead extraction and reimplant with Dr. Cindie on 8/11 at 3:00 pm. Dr. Kerrin will be back up surgeon.   She will have labs done at [facility] in [place].   Instruction letter will be sent via MyChart.  Hold Eliquis  x 3 days per CL.  Pt is concerned about being Diabetic and not eating with her procedure being at 3 pm. I informed her that I would reach out to Anesthesia team and get clarification on what time she could have a small meal prior to her procedure.

## 2023-11-19 ENCOUNTER — Ambulatory Visit (INDEPENDENT_AMBULATORY_CARE_PROVIDER_SITE_OTHER)

## 2023-11-19 DIAGNOSIS — I428 Other cardiomyopathies: Secondary | ICD-10-CM

## 2023-11-19 LAB — CUP PACEART REMOTE DEVICE CHECK
Battery Remaining Longevity: 73 mo
Battery Remaining Percentage: 74 %
Battery Voltage: 2.99 V
Brady Statistic AP VP Percent: 1 %
Brady Statistic AP VS Percent: 1 %
Brady Statistic AS VP Percent: 54 %
Brady Statistic AS VS Percent: 46 %
Brady Statistic RA Percent Paced: 1 %
Date Time Interrogation Session: 20250625020015
HighPow Impedance: 74 Ohm
HighPow Impedance: 74 Ohm
Implantable Lead Connection Status: 753985
Implantable Lead Connection Status: 753985
Implantable Lead Connection Status: 753985
Implantable Lead Implant Date: 20231227
Implantable Lead Implant Date: 20231227
Implantable Lead Implant Date: 20231227
Implantable Lead Location: 753858
Implantable Lead Location: 753859
Implantable Lead Location: 753860
Implantable Lead Model: 3830
Implantable Lead Model: 7122
Implantable Pulse Generator Implant Date: 20231227
Lead Channel Impedance Value: 380 Ohm
Lead Channel Impedance Value: 480 Ohm
Lead Channel Impedance Value: 540 Ohm
Lead Channel Pacing Threshold Amplitude: 0.75 V
Lead Channel Pacing Threshold Amplitude: 1 V
Lead Channel Pacing Threshold Amplitude: 6 V
Lead Channel Pacing Threshold Pulse Width: 0.5 ms
Lead Channel Pacing Threshold Pulse Width: 0.5 ms
Lead Channel Pacing Threshold Pulse Width: 0.5 ms
Lead Channel Sensing Intrinsic Amplitude: 1.3 mV
Lead Channel Sensing Intrinsic Amplitude: 3.2 mV
Lead Channel Setting Pacing Amplitude: 0.25 V
Lead Channel Setting Pacing Amplitude: 2 V
Lead Channel Setting Pacing Amplitude: 2.5 V
Lead Channel Setting Pacing Pulse Width: 0.05 ms
Lead Channel Setting Pacing Pulse Width: 0.5 ms
Lead Channel Setting Sensing Sensitivity: 0.3 mV
Pulse Gen Serial Number: 5556392

## 2023-11-20 ENCOUNTER — Ambulatory Visit: Payer: Self-pay | Admitting: Cardiology

## 2023-11-25 ENCOUNTER — Encounter: Payer: Self-pay | Admitting: Cardiology

## 2023-11-25 ENCOUNTER — Telehealth: Payer: Self-pay

## 2023-11-25 NOTE — Telephone Encounter (Signed)
 [facility] called in today stating they have a report for this pt and it was shown oversensing and would like to discuss this with a nurse. Name is [name] and direct number is [phone]

## 2023-11-25 NOTE — Telephone Encounter (Signed)
 Returned call to [employer] rep.  Provided Pt weight, date that alert was received and date of extraction.  No further information provided.

## 2023-11-25 NOTE — Telephone Encounter (Signed)
 Error

## 2023-12-19 LAB — CBC
Hematocrit: 39.6 % (ref 34.0–46.6)
Hemoglobin: 12.4 g/dL (ref 11.1–15.9)
MCH: 29.3 pg (ref 26.6–33.0)
MCHC: 31.3 g/dL — ABNORMAL LOW (ref 31.5–35.7)
MCV: 94 fL (ref 79–97)
Platelets: 221 x10E3/uL (ref 150–450)
RBC: 4.23 x10E6/uL (ref 3.77–5.28)
RDW: 13.1 % (ref 11.7–15.4)
WBC: 7.8 x10E3/uL (ref 3.4–10.8)

## 2023-12-19 LAB — BASIC METABOLIC PANEL WITH GFR
BUN/Creatinine Ratio: 11 (ref 9–23)
BUN: 13 mg/dL (ref 6–24)
CO2: 18 mmol/L — ABNORMAL LOW (ref 20–29)
Calcium: 8.9 mg/dL (ref 8.7–10.2)
Chloride: 109 mmol/L — ABNORMAL HIGH (ref 96–106)
Creatinine, Ser: 1.15 mg/dL — ABNORMAL HIGH (ref 0.57–1.00)
Glucose: 100 mg/dL — ABNORMAL HIGH (ref 70–99)
Potassium: 3.7 mmol/L (ref 3.5–5.2)
Sodium: 143 mmol/L (ref 134–144)
eGFR: 61 mL/min/1.73 (ref >59)

## 2023-12-29 ENCOUNTER — Telehealth (HOSPITAL_COMMUNITY): Payer: Self-pay

## 2023-12-29 NOTE — Telephone Encounter (Signed)
 Spoke with patient to discuss upcoming procedure.   Confirmed patient is scheduled for a Lead Extraction, Lead Insertion on Monday, August 11 with Dr. Ole Holts. Instructed patient to arrive at the Main Entrance A at [facility]: [address] [place], [place] [address] and check in at Admitting at 1:00 PM.   Labs completed  Any recent signs of acute illness or been started on antibiotics? Pt reports having a persistent dry cough for 1.5 weeks with shortness of breath and increased use of inhaler which slightly improves symptoms. She denies any fever, chest pain, edema or weight gain. She plans to contact PCP for further eval. Noted patient had an ED visit in June for mild asthma exacerbation and bronchitis.      Any new medications started? No Any medications to hold? Hold Mounjaro 1 week prior to your procedure. Pt reports she stopped on July 22. Hold Eliquis  for 3 days prior to your procedure- last dose on August 7.  Hold Torsemide  and Spironolactone  the morning of your procedure. Pt has contacted her PCP for instructions on how to adjust Insulin  pump prior to procedure.  Medication instructions:  On the morning of your procedure you may take your morning medications not discussed with a small sip of water.  No eating or drinking after 7:00 AM prior to procedure.   The night before your procedure and the morning of your procedure, wash thoroughly with the CHG surgical soap from the neck down, paying special attention to the area where your procedure will be performed.  Advised of plan to go home the same day and will only stay overnight if medically necessary. You MUST have a responsible adult to drive you home and MUST be with you the first 24 hours after you arrive home.  Patient verbalized understanding to all instructions provided and agreed to proceed with procedure.

## 2023-12-30 NOTE — Progress Notes (Signed)
 Shelly CHRISTELLA Blanch, MD, FAAFP   [facility]  - PRIMARY CARE [place] [facility] [address] [place] [address] Dept: [phone]  Dept Fax: [phone]  Shelly Shields DOB: Apr 26, 1981  Encounter Date: 12/30/2023    Chief Complaint  Patient presents with  . Cough    Pt c/o non-productive cough   . CHF    Assessment/Plan   1. Mild persistent asthma with acute exacerbation (CMD)   2. Cardiomyopathy, dilated (HCC)   3. 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  Increase to torsemide  twice daily x 2-3 days for CHF flare.  Continue other current medications.  Low salt diet.  Daily weights.  ER evaluation if cough or DOE worsens despite treatment.   Scheduled Future Appointments       Provider Department Dept Phone Center   01/01/2024 8:20 AM Elspeth Pac [facility] [place]  - [place] [phone]    01/13/2024 3:30 PM PS136 [facility]  - Women's [place] [phone] [address]   01/14/2024 2:00 PM Shelly Shields [facility]  - Primary Care [place] [phone] [facility] [address]   02/02/2024 9:20 AM Shelly Shields [facility]  - [place] [phone]    02/03/2024 3:20 PM Shelly Shields [facility] [address] [facility] - Endocrinology [place] [phone] [facility] [place]   02/09/2024 4:00 PM Shelly Shields  [facility]  - Diabetes Education [place] [phone] [facility] [address]   04/14/2024 4:30 PM Shelly Shields [facility] [phone] [facility] [address]   09/07/2024 3:00 PM Shelly Shields [facility]  - Neurology [place] [phone] [facility] [address]       Subjective  Shelly Shields is a 43 y.o. female  who presents for  Chief Complaint  Patient presents with  . Cough    Pt c/o non-productive cough   . CHF  .  Issues addressed by patient and physician today include:  PROBLEM #1:  Cough. Dry cough and SOB x 1 week. Hx asthma, cardiomyopathy with CHF with last EF 35% on 10/31/23. No feve, sputum, rhinorrhea, or ST.  Edema is static, but has gained some weight.  Taking torsemide .  Has not used albuterol .  Review of Systems  Constitutional:  Negative for fever.  Respiratory:  Positive for cough. Negative for shortness of breath.   Cardiovascular:  Positive for leg swelling. Negative for chest pain and palpitations.  Neurological:  Positive for speech difficulty and weakness.    Objective  BP 138/78 (BP Location: Right arm, Patient Position: Sitting)   Pulse 66   Temp 96.9 F (36.1 C) (Temporal)   Ht 1.549 m (5' 1)   Wt 122 kg (269 lb 3.2 oz)   SpO2 98%   BMI 50.86 kg/m  BP Readings from Last 3 Encounters:  12/30/23 138/78  12/25/23 128/60  11/12/23 131/77   Pulse Readings from Last 3 Encounters:  12/30/23 66  11/12/23 70  11/03/23 85   Wt Readings from Last 3 Encounters:  12/30/23 122 kg (269 lb 3.2 oz)  11/12/23 119 kg (263 lb 4.8 oz)  11/03/23 121 kg (265 lb 12.8 oz)   Ht Readings from Last 3 Encounters:  12/30/23 1.549 m (5' 1)  11/12/23 1.549 m (5' 1)  11/03/23 1.549 m (5' 0.98)   Body mass index is 50.86 kg/m. No LMP recorded. (Menstrual status: Hormonal Birth Control).  PHYSICAL EXAM: GENERAL APPEARANCE: Well appearing, well developed, morbidly obese, NAD HEENT: TMs normal, oropharynx clear.  Nasal mucosa WNL. NECK: Supple without lymphadenopathy or thyromegaly LUNGS: Clear to auscultation bilaterally HEART: Regular rate and rhythm. EXTREMITIES: Without clubbing, cyanosis.  2+ chronic BLE edema NEUROLOGIC: Alert and oriented x 3. Answered questions appropriately.  Mild dysarthria is chronic.  Labs: No visits with results within 7 Day(s) from this  visit.  Latest known visit with results is:  Clinical Support on 10/31/2023  Component Date Value Ref Range Status  . Potassium 10/31/2023 3.8  3.5 - 5.1 mmol/L Final   NO VISIBLE HEMOLYSIS    Orders Placed This Encounter  Medications  . albuterol -budesonide  (Airsupra) 90-80 mcg/actuation HFAA    Sig: Inhale 1 puff every 6 (six) to 8 (eight) hours as needed (Wheezing.  Cough).    Dispense:  10.7 g    Refill:  11    No orders of the defined types were placed in this encounter.   Requested Prescriptions   Signed Prescriptions Disp Refills  . albuterol -budesonide  (Airsupra) 90-80 mcg/actuation HFAA 10.7 g 11    Sig: Inhale 1 puff every 6 (six) to 8 (eight) hours as needed (Wheezing.  Cough).     Medications Discontinued During This Encounter  Medication Reason  . albuterol  HFA (PROVENTIL  HFA;VENTOLIN  HFA;PROAIR  HFA) 90 mcg/actuation inhaler Duplicate order      Problem List[1]  Health Maintenance Status       Date Due Completion Dates   HIV Screening Never done ---   Hepatitis C Screening Never done ---   Pneumococcal Vaccine: Pediatrics (0 to 5 years) and At-Risk Patients (6-49 Years) (1 of 2 - PCV) Never done ---   Hepatitis B Vaccines (1 of 3 - 19+ 3-dose series) Never done ---   Hepatitis A Vaccines (1 of 2 - Risk 2-dose series) Never done ---   HPV Vaccines (1 - 3-dose SCDM series) Never done ---   Diabetes: Retinopathy Screening Combo 11/01/2022 10/31/2021   DTaP/Tdap/Td Vaccines (6 - Td or Tdap) 02/24/2023 02/23/2013, 03/30/1982   Breast Cancer Screening (Mammogram) 06/16/2023 06/15/2021, 05/23/2020   Influenza Vaccine (1) 12/26/2023 03/07/2023, 03/22/2022   Varicella Vaccines (1 of 2 - 13+ 2-dose series) 12/29/2024 (Originally 02/13/1993) ---   Diabetes: Hemoglobin A1C 02/01/2024 08/01/2023, 08/01/2023   Diabetes:  Quantitative uACR for Kidney Evaluation 04/07/2024 04/08/2023   Comprehensive Annual Visit 09/21/2024 09/22/2023, 08/22/2022   Diabetes:  eGFR for Kidney  Evaluation 10/13/2024 10/14/2023, 04/08/2023   Diabetes: Foot Exam 10/30/2024 10/31/2023, 10/14/2023   Comment on 02/23/2022: See Legacy System   Depression Screening 12/29/2024 12/30/2023   Cervical Cancer Screening 07/18/2027 07/17/2022, 07/17/2022   Adult RSV (60+ Years or Pregnancy) (1 - 1-dose 75+ series) 02/14/2055 ---        Immunization History  Administered Date(s) Administered  . Covid-19 Vaccine Unspecified 02/12/2022  . Covid-19, Mrna, Lnp-s, Pf, Tris-sucrose, 30 Mcg/0.3 Ml 05/06/2023  . Dtap, Unspecified 04/11/1980, 05/10/1980, 07/01/1980, 03/30/1982  . Influenza, Injectable, Mdck, Preservative Free,quadrivalent 03/22/2022  . Influenza, Unspecified 03/18/2020, 02/09/2021  . Influenza, split virus, trivalent, preservative 02/21/2017, 03/07/2019  . Influenza,split virus, trivalent, PF 02/20/2018, 03/07/2023  . MMR 09/04/1981  . Pfizer SARS-CoV-2 Primary Series 12+ yrs 10/22/2019, 11/12/2019, 06/08/2020, 10/20/2020  . Polio, Unspecified 04/11/1980, 07/01/1980, 01/06/1981, 03/30/1982  . TDAP VACCINE (BOOSTRIX,ADACEL) 7Y+ 02/23/2013  Surgical History[2]  Family History[3]  Tobacco Use History[4] Social History   Substance and Sexual Activity  Alcohol Use Yes   Social History   Substance and Sexual Activity  Drug Use No    Allergies[5]   Shelly CHRISTELLA Blanch, MD, FAAFP  Encounter Date: 12/30/2023       [1] Patient Active Problem List Diagnosis  . Essential hypertension  . Migraines  . Generalized anxiety disorder  . Gastroesophageal reflux disease without esophagitis  . OSA on CPAP  . Hay fever  . Post-traumatic osteoarthritis of knee  . Meralgia paresthetica of left side  . Uncontrolled type 2 diabetes mellitus with hyperglycemia, with long-term current use of insulin  (HCC)  . B12 deficiency  . Hepatic steatosis  . Chronic superficial gastritis without bleeding  . Tinea cruris  . Closed displaced fracture of neck of fifth metacarpal bone of left hand with  routine healing  . Orbital mass  . History of 2019 novel coronavirus disease (COVID-19)  . Medical non-compliance  . Acute pulmonary embolism (HCC)  . Comprehensive diabetic foot examination, type 2 DM, encounter for (HCC)  . Tinea pedis of both feet  . Nonintractable chronic migraine  . COVID-19 long hauler  . Acute CVA (cerebrovascular accident) (HCC)  . Viral cardiomyopathy (HCC)  . Anticoagulant long-term use  . Fluency disorder associated with underlying disease  . Mild persistent asthma without complication  . Cardiomyopathy, dilated (HCC)  . History of pulmonary embolus (PE)  . Venous stasis dermatitis of both lower extremities  . Plantar fasciitis, bilateral  . Stasis edema with ulcer of both lower extremities (HCC)  . History of CVA (cerebrovascular accident)  . Pseudobulbar affect  . 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 Nodule of upper lobe of left lung  . Persistent hyperplasia of thymus (HCC)  . Chronic systolic heart failure (HCC)  . Uses self-applied continuous glucose monitoring device  . 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 LBBB (left bundle branch block)  . Pacemaker  . NICM (nonischemic cardiomyopathy) (HCC)  . Morbid obesity with body mass index (BMI) of 50.0 to 59.9 in adult (CMD)  . Chronic right flank pain  . Insulin  pump in place  . Hyperglycemic hyperosmolar nonketotic coma    (CMD)  . Superior labrum anterior-to-posterior (SLAP) tear of left shoulder  . Class 3 severe obesity due to excess calories with serious comorbidity and body mass index (BMI) of 50.0 to 59.9 in adult  . Poorly controlled type 2 diabetes mellitus with peripheral neuropathy    (CMD)  . Ingrowing toenail of left foot  [2] Past Surgical History: Procedure Laterality Date  . CARDIAC CATHETERIZATION  11/23  . CARDIAC PACEMAKER PLACEMENT  05/22/2022    Procedure: CARDIAC PACEMAKER PLACEMENT  . CARPAL TUNNEL RELEASE Right 7/18   Procedure: CARPAL TUNNEL RELEASE  . EYE SURGERY  03/2019   Procedure: EYE SURGERY  . WISDOM TOOTH EXTRACTION     Procedure: WISDOM TOOTH EXTRACTION  [3] Family History Problem Relation Name Age of Onset  . Thyroid disease Mother Slater   . Hypertension Mother Slater   . Stroke Father PeeWee   . Diabetes Father PeeWee   . Hypertension Father PeeWee   . Stroke Maternal Higinio Haber   . Colon cancer Maternal Uncle Garry   . Stroke Paternal Aunt Red   . Migraines Paternal Aunt Red   . Heart disease Paternal Aunt Red   .  Diabetes Maternal Grandmother Flo   . Heart disease Maternal Grandmother Flo   . Diabetes Paternal Grandmother Bea   . Heart disease Paternal Grandfather Sr   . Asthma Sister Mel   [4] Social History Tobacco Use  Smoking Status Never  Smokeless Tobacco Never  [5] Allergies Allergen Reactions  . Dapagliflozin Other (See Comments)    Recurrent yeast infections  . Ibuprofen Hives, Other (See Comments) and Rash    asthma  Asthma  asthma  Asthma  Asthma  asthma  asthma  Asthma  Other Reaction(s): Other (See Comments)    asthma  Asthma asthma  Asthma Asthma asthma asthma  Asthma  . Trulicity  [Dulaglutide ] GI Intolerance  . Ketorolac Hives and Rash    Causes asthma attack  Causes asthma attack  Causes asthma attack  Causes asthma attack  Causes asthma attack Causes asthma attack  . Lisinopril Cough and Other (See Comments)    Other Reaction(s): Cough, Other (See Comments)  . Metformin  Diarrhea  . Toradol Hives    Asthma attack

## 2024-01-01 NOTE — Telephone Encounter (Signed)
 Update: Patient was seen by PCP on 8/5 for mild persistent asthma with acute exacerbation. Per note: Change to Airsupra for asthma flare. Increase to torsemide  twice daily x 2-3 days for CHF flare.

## 2024-01-04 NOTE — Anesthesia Preprocedure Evaluation (Addendum)
 Anesthesia Evaluation  Patient identified by MRN, date of birth, ID band Patient awake    Reviewed: Allergy & Precautions, NPO status , Patient's Chart, lab work & pertinent test results, reviewed documented beta blocker date and time   History of Anesthesia Complications (+) history of anesthetic complications  Airway Mallampati: III  TM Distance: >3 FB Neck ROM: Limited    Dental no notable dental hx.    Pulmonary neg shortness of breath, asthma , sleep apnea , neg recent URI   breath sounds clear to auscultation       Cardiovascular hypertension, Pt. on medications and Pt. on home beta blockers +CHF (LVEF 30-3%, grade 2 diastolic dysfunction)  + dysrhythmias (last shock 10/2023 for VT w/ rate of 240bpm) Ventricular Tachycardia (-) pacemaker+ Cardiac Defibrillator (dislodged RV lead)  Rhythm:Regular Rate:Normal  Echo 10/31/23 1. Left ventricular ejection fraction, by estimation, is 30 to 35%. The  left ventricle has moderately decreased function. The left ventricle  demonstrates global hypokinesis. The left ventricular internal cavity size  was mildly dilated. There is mild  concentric left ventricular hypertrophy. Left ventricular diastolic  parameters are consistent with Grade II diastolic dysfunction  (pseudonormalization).   2. Right ventricular systolic function is normal. The right ventricular  size is normal.   3. Left atrial size was mildly dilated.   4. The mitral valve is normal in structure. Trivial mitral valve  regurgitation. No evidence of mitral stenosis.   5. The aortic valve is normal in structure. Aortic valve regurgitation is  not visualized. No aortic stenosis is present.   6. The inferior vena cava is normal in size with greater than 50%  respiratory variability, suggesting right atrial pressure of 3 mmHg.     Neuro/Psych  Headaches, neg Seizures CVA, Residual Symptoms  negative psych ROS    GI/Hepatic Neg liver ROS,GERD  Medicated and Controlled,,(+) neg Cirrhosis        Endo/Other  diabetes, Well Controlled, Type 2, Insulin  Dependent  Class 4 obesity (BMI 50)  Renal/GU Renal diseasenegative Renal ROS  negative genitourinary   Musculoskeletal negative musculoskeletal ROS (+)    Abdominal  (+) + obese  Peds  Hematology negative hematology ROS (+)   Anesthesia Other Findings   Reproductive/Obstetrics                              Anesthesia Physical Anesthesia Plan  ASA: 4  Anesthesia Plan: General   Post-op Pain Management: Minimal or no pain anticipated   Induction: Intravenous  PONV Risk Score and Plan: 3 and Ondansetron , Treatment may vary due to age or medical condition and Midazolam   Airway Management Planned: Oral ETT  Additional Equipment: Arterial line and TEE  Intra-op Plan:   Post-operative Plan: Extubation in OR  Informed Consent: I have reviewed the patients History and Physical, chart, labs and discussed the procedure including the risks, benefits and alternatives for the proposed anesthesia with the patient or authorized representative who has indicated his/her understanding and acceptance.     Dental advisory given  Plan Discussed with: CRNA  Anesthesia Plan Comments:          Anesthesia Quick Evaluation

## 2024-01-05 ENCOUNTER — Ambulatory Visit (HOSPITAL_COMMUNITY): Payer: Self-pay | Admitting: Certified Registered"

## 2024-01-05 ENCOUNTER — Encounter (HOSPITAL_COMMUNITY): Payer: Self-pay | Admitting: Cardiology

## 2024-01-05 ENCOUNTER — Ambulatory Visit (HOSPITAL_COMMUNITY)
Admission: RE | Admit: 2024-01-05 | Discharge: 2024-01-06 | Disposition: A | Discharge status: Home / Self Care | Attending: Cardiology | Admitting: Cardiology

## 2024-01-05 ENCOUNTER — Ambulatory Visit (HOSPITAL_COMMUNITY)

## 2024-01-05 ENCOUNTER — Encounter (HOSPITAL_COMMUNITY)
Admission: RE | Disposition: A | Discharge status: Home / Self Care | Payer: Self-pay | Source: Home / Self Care | Attending: Cardiology

## 2024-01-05 ENCOUNTER — Other Ambulatory Visit: Payer: Self-pay

## 2024-01-05 DIAGNOSIS — T82118A Breakdown (mechanical) of other cardiac electronic device, initial encounter: Secondary | ICD-10-CM

## 2024-01-05 DIAGNOSIS — T82120A Displacement of cardiac electrode, initial encounter: Secondary | ICD-10-CM | POA: Insufficient documentation

## 2024-01-05 DIAGNOSIS — I5022 Chronic systolic (congestive) heart failure: Secondary | ICD-10-CM | POA: Insufficient documentation

## 2024-01-05 DIAGNOSIS — I428 Other cardiomyopathies: Secondary | ICD-10-CM | POA: Insufficient documentation

## 2024-01-05 DIAGNOSIS — J45909 Unspecified asthma, uncomplicated: Secondary | ICD-10-CM | POA: Diagnosis not present

## 2024-01-05 DIAGNOSIS — I110 Hypertensive heart disease with heart failure: Secondary | ICD-10-CM | POA: Insufficient documentation

## 2024-01-05 DIAGNOSIS — Z8673 Personal history of transient ischemic attack (TIA), and cerebral infarction without residual deficits: Secondary | ICD-10-CM | POA: Insufficient documentation

## 2024-01-05 DIAGNOSIS — I472 Ventricular tachycardia, unspecified: Secondary | ICD-10-CM | POA: Diagnosis not present

## 2024-01-05 DIAGNOSIS — Z86711 Personal history of pulmonary embolism: Secondary | ICD-10-CM | POA: Insufficient documentation

## 2024-01-05 DIAGNOSIS — Z95810 Presence of automatic (implantable) cardiac defibrillator: Secondary | ICD-10-CM | POA: Diagnosis not present

## 2024-01-05 DIAGNOSIS — Y831 Surgical operation with implant of artificial internal device as the cause of abnormal reaction of the patient, or of later complication, without mention of misadventure at the time of the procedure: Secondary | ICD-10-CM | POA: Insufficient documentation

## 2024-01-05 DIAGNOSIS — G4733 Obstructive sleep apnea (adult) (pediatric): Secondary | ICD-10-CM | POA: Diagnosis not present

## 2024-01-05 DIAGNOSIS — Z79899 Other long term (current) drug therapy: Secondary | ICD-10-CM | POA: Diagnosis not present

## 2024-01-05 DIAGNOSIS — Z7901 Long term (current) use of anticoagulants: Secondary | ICD-10-CM | POA: Insufficient documentation

## 2024-01-05 DIAGNOSIS — E119 Type 2 diabetes mellitus without complications: Secondary | ICD-10-CM

## 2024-01-05 DIAGNOSIS — Z794 Long term (current) use of insulin: Secondary | ICD-10-CM | POA: Diagnosis not present

## 2024-01-05 DIAGNOSIS — Z01818 Encounter for other preprocedural examination: Secondary | ICD-10-CM

## 2024-01-05 DIAGNOSIS — I447 Left bundle-branch block, unspecified: Secondary | ICD-10-CM | POA: Insufficient documentation

## 2024-01-05 HISTORY — PX: LEAD EXTRACTION: EP1211

## 2024-01-05 HISTORY — PX: LEAD INSERTION: EP1212

## 2024-01-05 LAB — ECHO INTRAOPERATIVE TEE
Height: 61 in
Weight: 4304 [oz_av]

## 2024-01-05 LAB — GLUCOSE, CAPILLARY
Glucose-Capillary: 144 mg/dL — ABNORMAL HIGH (ref 70–99)
Glucose-Capillary: 119 mg/dL — ABNORMAL HIGH (ref 70–99)
Glucose-Capillary: 144 mg/dL — ABNORMAL HIGH (ref 70–99)
Glucose-Capillary: 218 mg/dL — ABNORMAL HIGH (ref 70–99)

## 2024-01-05 LAB — POCT PREGNANCY, URINE: Preg Test, Ur: NEGATIVE

## 2024-01-05 LAB — PREPARE RBC (CROSSMATCH)

## 2024-01-05 SURGERY — LEAD EXTRACTION
Anesthesia: General

## 2024-01-05 MED ORDER — LEVOCETIRIZINE DIHYDROCHLORIDE 5 MG PO TABS: 5.0000 mg | ORAL_TABLET | Freq: Every evening | ORAL | Status: DC

## 2024-01-05 MED ORDER — SODIUM CHLORIDE 0.9 % IV SOLN: INTRAVENOUS | Status: DC

## 2024-01-05 MED ORDER — MIDAZOLAM HCL 2 MG/2ML IJ SOLN
INTRAMUSCULAR | Status: DC | PRN
  Administered 2024-01-05 (×2): 2 mg via INTRAVENOUS

## 2024-01-05 MED ORDER — LIDOCAINE HCL 1 % IJ SOLN
INTRAMUSCULAR | Status: AC
  Filled 2024-01-05: qty 60

## 2024-01-05 MED ORDER — FENTANYL CITRATE (PF) 100 MCG/2ML IJ SOLN
INTRAMUSCULAR | Status: AC
  Filled 2024-01-05: qty 2

## 2024-01-05 MED ORDER — FENTANYL CITRATE (PF) 250 MCG/5ML IJ SOLN
INTRAMUSCULAR | Status: DC | PRN
  Administered 2024-01-05 (×2): 100 ug via INTRAVENOUS

## 2024-01-05 MED ORDER — CARVEDILOL 25 MG PO TABS
50.0000 mg | ORAL_TABLET | Freq: Two times a day (BID) | ORAL | Status: DC
  Administered 2024-01-06 (×2): 50 mg via ORAL
  Filled 2024-01-05: qty 2

## 2024-01-05 MED ORDER — SODIUM CHLORIDE 0.9 % IV SOLN
INTRAVENOUS | Status: AC
  Filled 2024-01-05: qty 2

## 2024-01-05 MED ORDER — ESCITALOPRAM OXALATE 10 MG PO TABS
10.0000 mg | ORAL_TABLET | Freq: Every day | ORAL | Status: DC
  Administered 2024-01-06 (×2): 10 mg via ORAL
  Filled 2024-01-05: qty 1

## 2024-01-05 MED ORDER — NOREPINEPHRINE 4 MG/250ML-% IV SOLN
INTRAVENOUS | Status: DC | PRN
  Administered 2024-01-05 (×2): 2 ug/min via INTRAVENOUS

## 2024-01-05 MED ORDER — ONDANSETRON 4 MG PO TBDP: 4.0000 mg | ORAL_TABLET | Freq: Three times a day (TID) | ORAL | Status: DC | PRN

## 2024-01-05 MED ORDER — TOPIRAMATE 25 MG PO TABS
200.0000 mg | ORAL_TABLET | Freq: Every day | ORAL | Status: DC
  Administered 2024-01-06 (×2): 200 mg via ORAL
  Filled 2024-01-05: qty 8

## 2024-01-05 MED ORDER — MIDAZOLAM HCL 2 MG/2ML IJ SOLN
INTRAMUSCULAR | Status: AC
  Filled 2024-01-05: qty 2

## 2024-01-05 MED ORDER — OXYCODONE HCL 5 MG PO TABS: 5.0000 mg | ORAL_TABLET | Freq: Once | ORAL | Status: DC | PRN

## 2024-01-05 MED ORDER — GABAPENTIN 300 MG PO CAPS
600.0000 mg | ORAL_CAPSULE | Freq: Three times a day (TID) | ORAL | Status: DC | PRN
  Administered 2024-01-05 – 2024-01-06 (×4): 600 mg via ORAL
  Filled 2024-01-05 (×2): qty 2

## 2024-01-05 MED ORDER — NORTRIPTYLINE HCL 10 MG PO CAPS
30.0000 mg | ORAL_CAPSULE | Freq: Every day | ORAL | Status: DC
  Administered 2024-01-05 (×2): 30 mg via ORAL
  Filled 2024-01-05 (×2): qty 3

## 2024-01-05 MED ORDER — INSULIN ASPART 100 UNIT/ML IJ SOLN: 0.0000 [IU] | Freq: Three times a day (TID) | INTRAMUSCULAR | Status: DC

## 2024-01-05 MED ORDER — CHLORHEXIDINE GLUCONATE 0.12 % MT SOLN
OROMUCOSAL | Status: AC
  Administered 2024-01-05 (×2): 15 mL
  Filled 2024-01-05: qty 15

## 2024-01-05 MED ORDER — IVABRADINE 2.5 MG HALF TABLET
7.5000 mg | ORAL_TABLET | Freq: Two times a day (BID) | ORAL | Status: DC
  Administered 2024-01-06 (×2): 7.5 mg via ORAL
  Filled 2024-01-05 (×2): qty 1

## 2024-01-05 MED ORDER — ONDANSETRON HCL 4 MG/2ML IJ SOLN: 4.0000 mg | Freq: Once | INTRAMUSCULAR | Status: DC | PRN

## 2024-01-05 MED ORDER — ONDANSETRON HCL 4 MG/2ML IJ SOLN: 4.0000 mg | Freq: Four times a day (QID) | INTRAMUSCULAR | Status: DC | PRN

## 2024-01-05 MED ORDER — HEPARIN (PORCINE) IN NACL 1000-0.9 UT/500ML-% IV SOLN
INTRAVENOUS | Status: DC | PRN
  Administered 2024-01-05 (×2): 500 mL

## 2024-01-05 MED ORDER — DEXTROSE 5 % IV SOLN
INTRAVENOUS | Status: DC | PRN
  Administered 2024-01-05 (×2): 1 mL via INTRAVENOUS

## 2024-01-05 MED ORDER — ACETAMINOPHEN 10 MG/ML IV SOLN: 1000.0000 mg | Freq: Once | INTRAVENOUS | Status: DC | PRN

## 2024-01-05 MED ORDER — CEFAZOLIN SODIUM-DEXTROSE 2-4 GM/100ML-% IV SOLN
INTRAVENOUS | Status: AC
  Filled 2024-01-05: qty 100

## 2024-01-05 MED ORDER — PROPOFOL 10 MG/ML IV BOLUS
INTRAVENOUS | Status: DC | PRN
Start: 2024-01-05 — End: 2024-01-05
  Administered 2024-01-05 (×2): 50 mg via INTRAVENOUS

## 2024-01-05 MED ORDER — LIDOCAINE 2% (20 MG/ML) 5 ML SYRINGE
INTRAMUSCULAR | Status: DC | PRN
  Administered 2024-01-05 (×2): 100 mg via INTRAVENOUS

## 2024-01-05 MED ORDER — SODIUM CHLORIDE 0.9 % IV SOLN: 10.0000 mL/h | Freq: Once | INTRAVENOUS | Status: DC

## 2024-01-05 MED ORDER — CEFAZOLIN SODIUM-DEXTROSE 3-4 GM/150ML-% IV SOLN
3.0000 g | INTRAVENOUS | Status: AC
  Administered 2024-01-05 (×2): 3 g via INTRAVENOUS
  Filled 2024-01-05: qty 150

## 2024-01-05 MED ORDER — INSULIN ASPART 100 UNIT/ML IJ SOLN
0.0000 [IU] | Freq: Every day | INTRAMUSCULAR | Status: DC
  Administered 2024-01-05 (×2): 2 [IU] via SUBCUTANEOUS

## 2024-01-05 MED ORDER — INSULIN PUMP
Freq: Three times a day (TID) | SUBCUTANEOUS | Status: DC
  Filled 2024-01-05: qty 1

## 2024-01-05 MED ORDER — DEXAMETHASONE SODIUM PHOSPHATE 10 MG/ML IJ SOLN
INTRAMUSCULAR | Status: DC | PRN
  Administered 2024-01-05 (×2): 4 mg via INTRAVENOUS

## 2024-01-05 MED ORDER — ROCURONIUM BROMIDE 10 MG/ML (PF) SYRINGE
PREFILLED_SYRINGE | INTRAVENOUS | Status: DC | PRN
  Administered 2024-01-05 (×2): 100 mg via INTRAVENOUS

## 2024-01-05 MED ORDER — FENTANYL CITRATE (PF) 100 MCG/2ML IJ SOLN
25.0000 ug | INTRAMUSCULAR | Status: DC | PRN
  Administered 2024-01-05 (×2): 50 ug via INTRAVENOUS

## 2024-01-05 MED ORDER — SUGAMMADEX SODIUM 200 MG/2ML IV SOLN
INTRAVENOUS | Status: DC | PRN
  Administered 2024-01-05 (×2): 300 mg via INTRAVENOUS

## 2024-01-05 MED ORDER — OXYCODONE HCL 5 MG/5ML PO SOLN: 5.0000 mg | Freq: Once | ORAL | Status: DC | PRN

## 2024-01-05 MED ORDER — LACTATED RINGERS IV SOLN: INTRAVENOUS | Status: DC

## 2024-01-05 MED ORDER — POVIDONE-IODINE 10 % EX SWAB
2.0000 | Freq: Once | CUTANEOUS | Status: AC
Start: 2024-01-05 — End: 2024-01-05
  Administered 2024-01-05 (×2): 2 via TOPICAL

## 2024-01-05 MED ORDER — CHLORHEXIDINE GLUCONATE 4 % EX SOLN: 4.0000 | Freq: Once | CUTANEOUS | Status: DC

## 2024-01-05 MED ORDER — PANTOPRAZOLE SODIUM 40 MG PO TBEC
40.0000 mg | DELAYED_RELEASE_TABLET | Freq: Every day | ORAL | Status: DC
  Administered 2024-01-06 (×2): 40 mg via ORAL
  Filled 2024-01-05: qty 1

## 2024-01-05 MED ORDER — SACUBITRIL-VALSARTAN 97-103 MG PO TABS
1.0000 | ORAL_TABLET | Freq: Two times a day (BID) | ORAL | Status: DC
  Administered 2024-01-06 (×2): 1 via ORAL
  Filled 2024-01-05: qty 1

## 2024-01-05 MED ORDER — LIDOCAINE HCL (PF) 1 % IJ SOLN
INTRAMUSCULAR | Status: DC | PRN
  Administered 2024-01-05 (×2): 30 mL

## 2024-01-05 MED ORDER — PHENYLEPHRINE 80 MCG/ML (10ML) SYRINGE FOR IV PUSH (FOR BLOOD PRESSURE SUPPORT)
PREFILLED_SYRINGE | INTRAVENOUS | Status: DC | PRN
  Administered 2024-01-05 (×2): 160 ug via INTRAVENOUS

## 2024-01-05 MED ORDER — GENTAMICIN IN SALINE 1.6-0.9 MG/ML-% IV SOLN
INTRAVENOUS | Status: AC
  Filled 2024-01-05: qty 50

## 2024-01-05 MED ORDER — SODIUM CHLORIDE 0.9 % IV SOLN
80.0000 mg | INTRAVENOUS | Status: AC
  Administered 2024-01-05 (×2): 80 mg

## 2024-01-05 MED ORDER — SODIUM CHLORIDE 0.9% IV SOLUTION
Freq: Once | INTRAVENOUS | Status: DC
Start: 2024-01-05 — End: 2024-01-06

## 2024-01-05 MED ORDER — SPIRONOLACTONE 25 MG PO TABS
25.0000 mg | ORAL_TABLET | Freq: Every day | ORAL | Status: DC
  Administered 2024-01-05 – 2024-01-06 (×4): 25 mg via ORAL
  Filled 2024-01-05 (×2): qty 1

## 2024-01-05 MED ORDER — ACETAMINOPHEN 325 MG PO TABS
325.0000 mg | ORAL_TABLET | ORAL | Status: DC | PRN
  Administered 2024-01-06 (×4): 650 mg via ORAL
  Filled 2024-01-05 (×2): qty 2

## 2024-01-05 MED ORDER — LORATADINE 10 MG PO TABS
10.0000 mg | ORAL_TABLET | Freq: Every day | ORAL | Status: DC
  Administered 2024-01-06 (×2): 10 mg via ORAL
  Filled 2024-01-05 (×2): qty 1

## 2024-01-05 SURGICAL SUPPLY — 11 items
CABLE SURGICAL S-101-97-12 (CABLE) ×1 IMPLANT
CLOSURE PERCLOSE PROSTYLE (VASCULAR PRODUCTS) IMPLANT
LEAD DURATA 7122-65CM (Lead) IMPLANT
MAT PREVALON FULL STRYKER (MISCELLANEOUS) IMPLANT
PAD DEFIB RADIO PHYSIO CONN (PAD) ×1 IMPLANT
POUCH AIGIS-R ANTIBACT ICD LRG (Mesh General) IMPLANT
SHEATH 7FR PRELUDE SNAP 13 (SHEATH) IMPLANT
SHEATH PINNACLE 8F 10CM (SHEATH) IMPLANT
SHEATH PROBE COVER 6X72 (BAG) IMPLANT
TRAY PACEMAKER INSERTION (PACKS) ×1 IMPLANT
WIRE HI TORQ VERSACORE-J 145CM (WIRE) IMPLANT

## 2024-01-05 NOTE — Anesthesia Procedure Notes (Signed)
 Procedure Name: Intubation Date/Time: 01/05/2024 4:22 PM  Performed by: Mannie Krystal LABOR, CRNAPre-anesthesia Checklist: Patient identified, Emergency Drugs available, Suction available and Patient being monitored Patient Re-evaluated:Patient Re-evaluated prior to induction Oxygen Delivery Method: Circle system utilized Preoxygenation: Pre-oxygenation with 100% oxygen Induction Type: IV induction Ventilation: Mask ventilation without difficulty Laryngoscope Size: Mac and 4 Grade View: Grade II Tube type: Oral Tube size: 7.5 mm Number of attempts: 1 Airway Equipment and Method: Stylet and Oral airway Placement Confirmation: ETT inserted through vocal cords under direct vision, positive ETCO2 and breath sounds checked- equal and bilateral Secured at: 22 cm Tube secured with: Tape Dental Injury: Teeth and Oropharynx as per pre-operative assessment

## 2024-01-05 NOTE — Transfer of Care (Signed)
 Immediate Anesthesia Transfer of Care Note  Patient: Shelly Shields  Procedure(s) Performed: LEAD EXTRACTION LEAD INSERTION  Patient Location: PACU  Anesthesia Type:General  Level of Consciousness: awake, alert , and oriented  Airway & Oxygen Therapy: Patient Spontanous Breathing and Patient connected to face mask oxygen  Post-op Assessment: Report given to RN and Post -op Vital signs reviewed and stable  Post vital signs: Reviewed and stable  Last Vitals:  Vitals Value Taken Time  BP 137/75 01/05/24 18:15  Temp    Pulse 66 01/05/24 18:16  Resp 16 01/05/24 18:16  SpO2 97 % 01/05/24 18:16  Vitals shown include unfiled device data.  Last Pain:  Vitals:   01/05/24 1313  TempSrc:   PainSc: 3          Complications: No notable events documented.

## 2024-01-05 NOTE — H&P (Signed)
 Electrophysiology Office Follow up Visit Note:     Date:  01/05/2024    ID:  Shelly Shields, DOB [birth year], MRN [id number]   PCP:  Magdaline Debby HERO, MD             [facility] Cardiologist:  Gordy Bergamo, MD  [facility] Electrophysiologist:  OLE ONEIDA HOLTS, MD      Interval History:       Shelly Shields is a 43 y.o. female who presents for a follow up visit.    She last saw Dr. Fernande August 27, 2022.  She has a ICD for nonischemic cardiomyopathy and class III heart failure symptoms.  She had a left bundle area lead added in December 2023.  That  upgrade was complicated by microperforation prompting lead revision.  She also has a history of prior stroke, PE.   She saw Daphne on November 07, 2023.  That was after she received successful ATP on June 9 for VT with a heart rate about 240 bpm.  Interrogation of the device showed inability to capture on the RV lead.  Chest x-ray performed after the procedure demonstrated gross dislodgment of the ICD lead.   She presents today to discuss system revision given grossly dislodged RV lead.      Presents for lead extraction and replacement. Procedure reviewed.   Objective Past medical, surgical, social and family history were reviewed.   ROS:   Please see the history of present illness.    All other systems reviewed and are negative.   EKGs/Labs/Other Studies Reviewed:     The following studies were reviewed today:               Physical Exam:     VS:  120/93. 61, 20, AF        Wt Readings from Last 3 Encounters:  11/12/23 262 lb (118.8 kg)  11/07/23 262 lb 8 oz (119.1 kg)  05/01/23 262 lb 6.4 oz (119 kg)      GEN: no distress CARD: RRR, No MRG. ICD pocket well healed. RESP: No IWOB. CTAB.     Assessment ASSESSMENT:     1. Non-ischemic cardiomyopathy (HCC)   2. LBBB (left bundle branch block)   3. Chronic systolic heart failure (HCC)   4. Presence of cardiac resynchronization therapy defibrillator (CRT-D)   5.  Dislodgement of ventricular electrode lead of implantable cardioverter-defibrillator (ICD)     PLAN:     In order of problems listed above:   #ICD in situ #NICM #Chronic systolic heart failure #Dislodgement of RV ICD lead The patient presents with a grossly dislodged RV ICD lead.  We discussed options for management including abandonment and replacement versus extraction of the lead and revision of her system.  Her system is not MRI compatible because of a mixed system.   Her CRT-D system was implanted in December 2023.   Risks, benefits, alternatives to lead extraction and replacement were discussed in detail with the patient today. The patient understands that the risks include but are not limited to bleeding, infection, pneumothorax, perforation, tamponade, vascular damage, renal failure, MI, stroke, death, severe bleeding requiring emergent open heart surgery and lead dislodgement and wishes to proceed.  We will therefore schedule device implantation at the next available time.   She will need to hold Eliquis  for 3 days prior to the procedure.    Presents for ICD lead extraction and replacement. Procedure reviewed.     Signed, OLE HOLTS, MD, [facility]  01/05/2024 Electrophysiology [facility]

## 2024-01-05 NOTE — Anesthesia Procedure Notes (Signed)
 Arterial Line Insertion Start/End8/03/2024 2:00 PM, 01/05/2024 2:05 PM Performed by: Zelphia Norleen HERO, CRNA, CRNA  Patient location: Pre-op . Preanesthetic checklist: patient identified, IV checked, site marked, risks and benefits discussed, surgical consent, monitors and equipment checked, pre-op  evaluation, timeout performed and anesthesia consent Lidocaine  1% used for infiltration Right, radial was placed Catheter size: 20 G Hand hygiene performed  and maximum sterile barriers used   Attempts: 2 Procedure performed using ultrasound guided technique. Following insertion, dressing applied and Biopatch. Post procedure assessment: normal and unchanged  Post procedure complications: unsuccessful attempts. Patient tolerated the procedure well with no immediate complications.

## 2024-01-06 ENCOUNTER — Ambulatory Visit (HOSPITAL_COMMUNITY)

## 2024-01-06 ENCOUNTER — Other Ambulatory Visit: Payer: Self-pay

## 2024-01-06 ENCOUNTER — Encounter: Payer: Self-pay | Admitting: Pulmonary Disease

## 2024-01-06 ENCOUNTER — Encounter (HOSPITAL_COMMUNITY): Payer: Self-pay | Admitting: Cardiology

## 2024-01-06 DIAGNOSIS — I428 Other cardiomyopathies: Secondary | ICD-10-CM | POA: Diagnosis not present

## 2024-01-06 DIAGNOSIS — T82120A Displacement of cardiac electrode, initial encounter: Secondary | ICD-10-CM | POA: Diagnosis not present

## 2024-01-06 DIAGNOSIS — I472 Ventricular tachycardia, unspecified: Secondary | ICD-10-CM | POA: Diagnosis not present

## 2024-01-06 DIAGNOSIS — I5022 Chronic systolic (congestive) heart failure: Secondary | ICD-10-CM | POA: Diagnosis not present

## 2024-01-06 LAB — GLUCOSE, CAPILLARY
Glucose-Capillary: 159 mg/dL — ABNORMAL HIGH (ref 70–99)
Glucose-Capillary: 87 mg/dL (ref 70–99)

## 2024-01-06 MED ORDER — ORAL CARE MOUTH RINSE
15.0000 mL | OROMUCOSAL | Status: DC | PRN
Start: 2024-01-06 — End: 2024-01-06

## 2024-01-06 MED ORDER — ACETAMINOPHEN 325 MG PO TABS: 325.0000 mg | ORAL_TABLET | ORAL | Status: AC | PRN

## 2024-01-06 NOTE — Progress Notes (Signed)
 Discharge instructions given to both the pt & her mom. Pt's iv & heart monitor removed. All valuables gathered & sent with the pt. All questions answered & information verified via teachback.  Letter given to pt. Akira Adelsberger R, RN

## 2024-01-06 NOTE — Inpatient Diabetes Management (Signed)
 Inpatient Diabetes Program Recommendations  AACE/ADA: New Consensus Statement on Inpatient Glycemic Control (2015)  Target Ranges:  Prepandial:   less than 140 mg/dL      Peak postprandial:   less than 180 mg/dL (1-2 hours)      Critically ill patients:  140 - 180 mg/dL   Lab Results  Component Value Date   GLUCAP 87 01/06/2024   HGBA1C 11.2 (H) 03/27/2020    Review of Glycemic Control  Diabetes history: DM 2 Outpatient Diabetes medications:  Omnipod insulin  pump with U-200 Humalog  Automode ICR 1:5 ISF  1:10 Target 110 Dexcom G7 Also taking Mounjaro 5 mg Weekly on Tuesdays  Endocrinologist Dr. Heron Junk with [facility] Next follow up appointment Septemer. Sees Endo Q3 months.  Thanks,  Clotilda Bull RN, MSN, BC-ADM Inpatient Diabetes Coordinator Team Pager [phone] (8a-5p)

## 2024-01-06 NOTE — Plan of Care (Signed)
  Problem: Education: Goal: Knowledge of cardiac device and self-care will improve Outcome: Progressing   Problem: Cardiac: Goal: Ability to achieve and maintain adequate cardiopulmonary perfusion will improve Outcome: Progressing   Problem: Clinical Measurements: Goal: Will remain free from infection Outcome: Progressing Goal: Respiratory complications will improve Outcome: Progressing Goal: Cardiovascular complication will be avoided Outcome: Progressing   Problem: Activity: Goal: Risk for activity intolerance will decrease Outcome: Progressing   Problem: Nutrition: Goal: Adequate nutrition will be maintained Outcome: Progressing   Problem: Pain Managment: Goal: General experience of comfort will improve and/or be controlled Outcome: Progressing   Problem: Safety: Goal: Ability to remain free from injury will improve Outcome: Progressing

## 2024-01-06 NOTE — Anesthesia Postprocedure Evaluation (Signed)
 Anesthesia Post Note  Patient: Shelly Shields  Procedure(s) Performed: LEAD EXTRACTION LEAD INSERTION     Patient location during evaluation: PACU Anesthesia Type: General Level of consciousness: awake and alert Pain management: pain level controlled Vital Signs Assessment: post-procedure vital signs reviewed and stable Respiratory status: spontaneous breathing, nonlabored ventilation, respiratory function stable and patient connected to nasal cannula oxygen Cardiovascular status: blood pressure returned to baseline and stable Postop Assessment: no apparent nausea or vomiting Anesthetic complications: no   No notable events documented.                Dail Meece D Briyan Kleven

## 2024-01-06 NOTE — TOC CM/SW Note (Signed)
 Transition of Care [facility]) - Inpatient Brief Assessment   Patient Details  Name: Shelly Shields MRN: [id number] Date of Birth: 1981-01-31  Transition of Care [facility] CM/SW Contact:    Sudie Erminio Deems, RN Phone Number: 01/06/2024, 8:00 AM   Clinical Narrative: Patient presented for lead extraction and replacement. PTA patient was from home with family support. Patient has insurance and PCP. Patient reports no issues obtaining medications. No home needs identified at this time.     Transition of Care Asessment: Insurance and Status: Insurance coverage has been reviewed Patient has primary care physician: Yes ([place]) Home environment has been reviewed: reviewed Prior level of function:: independent Prior/Current Home Services: No current home services Social Drivers of Health Review: SDOH reviewed no interventions necessary Readmission risk has been reviewed: Yes Transition of care needs: no transition of care needs at this time

## 2024-01-06 NOTE — Discharge Summary (Signed)
 ELECTROPHYSIOLOGY PROCEDURE DISCHARGE SUMMARY    Patient ID: Shelly Shields,  MRN: [id number], DOB/AGE: 1980/11/06 43 y.o.  Admit date: 01/05/2024 Discharge date: 01/06/2024  Primary Care Physician: Magdaline Debby HERO, MD  Primary Cardiologist: Gordy Bergamo, MD  Electrophysiologist: Dr. Cindie    Primary Diagnosis:  Chronic Systolic CHF   Secondary Diagnosis: VT  NICM CVA  Hx PE   Allergies  Allergen Reactions   Doreen [Dapagliflozin] Other (See Comments)    Recurrent yeast infections   Ibuprofen Hives and Rash    Other Reaction(s): Other (See Comments)  asthma    Asthma asthma    Asthma Asthma asthma asthma    Asthma   Ketorolac Hives and Rash    Causes asthma attack    Causes asthma attack Causes asthma attack   Lisinopril Cough   Metformin  Diarrhea     Procedures This Admission:  Extraction of an Abbott CRT-D, initially implanted 04/2022 2.  Implantation of a Abbott BiV ICD on 01/05/24 by Dr. Cindie.  The patient received a Abbott Unify Asur CRT [id number] with existing Abbott LPA 1231 right atrial lead and Abbott Durata 7122 right ventricular lead. She had an existing left bundle area lead - Medtronic 3830 DFTs were deferred at time of implant There were no post procedure complications 3.  CXR on 01/06/24 demonstrated no pneumothorax status post device implantation.      Brief HPI:  Shelly Shields is a 43 y.o. female with h/o HFrEF s/p ICD, HTN, OSA, asthma, DM II, PE, long COVID was referred to electrophysiology in the outpatient setting for extraction of ICD due to lead dislodgement.  She was found to have an abnormal device check in clinic with evidence of lead dislodgement / inability to capture on RV lead.  Past medical history includes above. Risks, benefits, and alternatives to ICD extraction were reviewed with the patient who wished to proceed.   Hospital Course:  The patient was admitted and extraction of RV lead was completed by Dr. Cindie.   The pt then underwent implantation of a Abbott RV ICD lead with details as outlined above.  They were monitored on telemetry overnight which demonstrated appropriate pacing .  Left chest was without hematoma or ecchymosis.  The device was interrogated and found to be functioning normally.  CXR was obtained and demonstrated no pneumothorax status post device implantation..  Wound care, arm mobility, and restrictions were reviewed with the patient.  The patient was examined and considered stable for discharge to home.   The patient's discharge medications include Entresto , Coreg  & spironolactone . Home meds unchanged at discharge.   Anticoagulation resumption This patient should resume their Eliquis  on 01/11/24  Physical Exam: Vitals:   01/05/24 2215 01/05/24 2343 01/06/24 0415 01/06/24 0923  BP: (!) 153/68 (!) 139/58 (!) 135/57   Pulse: 82 81 75   Resp:  18 20 14   Temp:  98.9 F (37.2 C) 99.1 F (37.3 C) 97.9 F (36.6 C)  TempSrc:  Oral Oral Oral  SpO2: 100% 100% 98%   Weight:      Height:        GEN- NAD. A&O x 3.  HEENT: Normocephalic, atraumatic Lungs- CTAB, normal effort.  Heart- RRR. No M/G/R.  GI- Soft, NT, ND.  Extremities- No clubbing, cyanosis, or edema Skin- Warm and dry, no rash or lesion. ICD site with steri-strips intact, scant amt blood under steri strips, mild edema at site, no hematoma, ecchymosis or erythema  Discharge Medications:  Allergies  as of 01/06/2024       Reactions   Farxiga [dapagliflozin] Other (See Comments)   Recurrent yeast infections   Ibuprofen Hives, Rash   Other Reaction(s): Other (See Comments) asthma    Asthma asthma    Asthma Asthma asthma asthma    Asthma   Ketorolac Hives, Rash   Causes asthma attack    Causes asthma attack Causes asthma attack   Lisinopril Cough   Metformin  Diarrhea        Medication List     PAUSE taking these medications    apixaban  5 MG Tabs tablet Wait to take this until: January 11, 2024 Commonly  known as: ELIQUIS  Take 1 tablet (5 mg total) by mouth 2 (two) times daily. Resume on 11/2 morning       STOP taking these medications    doxycycline  100 MG capsule Commonly known as: VIBRAMYCIN        TAKE these medications    Accu-Chek Nano SmartView w/Device Kit 1 Device by Does not apply route 4 (four) times daily - after meals and at bedtime.   accu-chek softclix lancets Use as instructed for blood glucose checks four times daily, before meals and at bedtime   acetaminophen  325 MG tablet Commonly known as: TYLENOL  Take 1-2 tablets (325-650 mg total) by mouth every 4 (four) hours as needed for mild pain (pain score 1-3).   Airsupra 90-80 MCG/ACT Aero Generic drug: Albuterol -Budesonide  Inhale 2 puffs into the lungs every 6 (six) hours as needed (shortness of breath or wheezing).   B-12 1000 MCG Subl Place 1,000 mcg under the tongue daily.   Baclofen 5 MG Tabs Take 5-10 mg by mouth daily as needed (Migraines).   butalbital -acetaminophen -caffeine  50-325-40 MG tablet Commonly known as: FIORICET  Take 1 tablet by mouth every 6 (six) hours as needed for headache.   carvedilol  25 MG tablet Commonly known as: COREG  TAKE 2 TABLETS(50 MG) BY MOUTH TWICE DAILY WITH A MEAL   Dexcom G6 Transmitter Misc See admin instructions.   Dexcom G7 Sensor Misc by Does not apply route.   Entresto  97-103 MG Generic drug: sacubitril -valsartan  TAKE 1 TABLET BY MOUTH TWICE DAILY   escitalopram  10 MG tablet Commonly known as: LEXAPRO  Take 10 mg by mouth daily.   fluticasone 50 MCG/ACT nasal spray Commonly known as: FLONASE Place 2 sprays into both nostrils at bedtime.   gabapentin  300 MG capsule Commonly known as: NEURONTIN  Take 600 mg by mouth 3 (three) times daily as needed (pain).   glucose blood test strip Commonly known as: Accu-Chek SmartView Check sugar 6 x daily   glycopyrrolate 1 MG tablet Commonly known as: ROBINUL Take 1 mg by mouth 3 (three) times daily as  needed (stomach acid).   HumaLOG KwikPen 200 UNIT/ML KwikPen Generic drug: insulin  lispro Inject 10-50 Units into the skin See admin instructions. Via Insulin  Pump   hydrOXYzine  10 MG tablet Commonly known as: ATARAX  Take 1 tablet (10 mg total) by mouth 3 (three) times daily as needed. What changed: reasons to take this   Insulin  Pen Needle 32G X 4 MM Misc Commonly known as: Insupen Pen Needles BD Pen Needles- brand specific. Inject insulin  via insulin  pen daily   ivabradine  7.5 MG Tabs tablet Commonly known as: CORLANOR  Take 1 tablet (7.5 mg total) by mouth 2 (two) times daily with a meal.   ketotifen  0.035 % ophthalmic solution Commonly known as: ZADITOR  Place 1 drop into both eyes daily as needed (allergies).   levocetirizine 5 MG tablet  Commonly known as: XYZAL  Take 5 mg by mouth every evening.   medroxyPROGESTERone 150 MG/ML injection Commonly known as: DEPO-PROVERA Inject 150 mg into the muscle every 3 (three) months.   montelukast 10 MG tablet Commonly known as: SINGULAIR Take 10 mg by mouth at bedtime.   Multi-Vitamin tablet Take 1 tablet by mouth daily.   nortriptyline  10 MG capsule Commonly known as: PAMELOR  Take 30 mg by mouth at bedtime.   Nurtec 75 MG Tbdp Generic drug: Rimegepant Sulfate Take 75 mg by mouth daily as needed (migraines).   nystatin  cream Commonly known as: MYCOSTATIN  Apply to affected area 2 times daily   Omnipod DASH Pods (Gen 4) Misc 200 Units every other day. Humalog   ondansetron  4 MG disintegrating tablet Commonly known as: ZOFRAN -ODT Take 1 tablet (4 mg total) by mouth every 8 (eight) hours as needed for nausea or vomiting.   ondansetron  4 MG tablet Commonly known as: ZOFRAN  Take 4 mg by mouth every 8 (eight) hours as needed for nausea or vomiting.   pantoprazole  40 MG tablet Commonly known as: PROTONIX  Take 40 mg by mouth daily.   potassium chloride  10 MEQ tablet Commonly known as: KLOR-CON  Take 1 tablet (10 mEq  total) by mouth daily. What changed: when to take this   rosuvastatin  20 MG tablet Commonly known as: CRESTOR  Take 1 tablet (20 mg total) by mouth daily at 6 PM.   spironolactone  25 MG tablet Commonly known as: ALDACTONE  TAKE 1 TABLET(25 MG) BY MOUTH DAILY   topiramate  100 MG tablet Commonly known as: TOPAMAX  Take 200 mg by mouth daily.   torsemide  20 MG tablet Commonly known as: DEMADEX  Take 20 mg by mouth See admin instructions. Take 20 mg by mouth daily, make take an additional 20 mg in afternoon if weight up > 3 Lbs in 3 days   TRUEplus Lancets 33G Misc USE TO CHECK BLOOD GLUCOSE QID - AFTER MEALS AND AT BEDTIME               Discharge Care Instructions  (From admission, onward)           Start     Ordered   01/06/24 0000  Discharge wound care:       Comments: See wound care instructions attached   01/06/24 1120            Disposition: Home with usual follow up as in AVS  Duration of Discharge Encounter:  APP time: 35 minutes  Signed, Daphne Barrack, NP-C, AGACNP-BC [facility] - Electrophysiology  01/06/2024, 12:04 PM

## 2024-01-06 NOTE — Plan of Care (Signed)
  Problem: Education: Goal: Knowledge of cardiac device and self-care will improve Outcome: Completed/Met Goal: Ability to safely manage health related needs after discharge will improve Outcome: Completed/Met Goal: Individualized Educational Video(s) Outcome: Completed/Met   Problem: Cardiac: Goal: Ability to achieve and maintain adequate cardiopulmonary perfusion will improve Outcome: Completed/Met   Problem: Education: Goal: Knowledge of General Education information will improve Description: Including pain rating scale, medication(s)/side effects and non-pharmacologic comfort measures Outcome: Completed/Met   Problem: Health Behavior/Discharge Planning: Goal: Ability to manage health-related needs will improve Outcome: Completed/Met   Problem: Clinical Measurements: Goal: Ability to maintain clinical measurements within normal limits will improve Outcome: Completed/Met Goal: Will remain free from infection Outcome: Completed/Met Goal: Diagnostic test results will improve Outcome: Completed/Met Goal: Respiratory complications will improve Outcome: Completed/Met Goal: Cardiovascular complication will be avoided Outcome: Completed/Met   Problem: Activity: Goal: Risk for activity intolerance will decrease Outcome: Completed/Met   Problem: Nutrition: Goal: Adequate nutrition will be maintained Outcome: Completed/Met   Problem: Coping: Goal: Level of anxiety will decrease Outcome: Completed/Met   Problem: Elimination: Goal: Will not experience complications related to bowel motility Outcome: Completed/Met Goal: Will not experience complications related to urinary retention Outcome: Completed/Met   Problem: Pain Managment: Goal: General experience of comfort will improve and/or be controlled Outcome: Completed/Met   Problem: Safety: Goal: Ability to remain free from injury will improve Outcome: Completed/Met   Problem: Skin Integrity: Goal: Risk for impaired  skin integrity will decrease Outcome: Completed/Met   Problem: Education: Goal: Ability to describe self-care measures that may prevent or decrease complications (Diabetes Survival Skills Education) will improve Outcome: Completed/Met Goal: Individualized Educational Video(s) Outcome: Completed/Met   Problem: Coping: Goal: Ability to adjust to condition or change in health will improve Outcome: Completed/Met   Problem: Fluid Volume: Goal: Ability to maintain a balanced intake and output will improve Outcome: Completed/Met   Problem: Health Behavior/Discharge Planning: Goal: Ability to identify and utilize available resources and services will improve Outcome: Completed/Met Goal: Ability to manage health-related needs will improve Outcome: Completed/Met   Problem: Metabolic: Goal: Ability to maintain appropriate glucose levels will improve Outcome: Completed/Met   Problem: Nutritional: Goal: Maintenance of adequate nutrition will improve Outcome: Completed/Met Goal: Progress toward achieving an optimal weight will improve Outcome: Completed/Met   Problem: Skin Integrity: Goal: Risk for impaired skin integrity will decrease Outcome: Completed/Met   Problem: Tissue Perfusion: Goal: Adequacy of tissue perfusion will improve Outcome: Completed/Met

## 2024-01-06 NOTE — Discharge Instructions (Signed)
 After Your ICD (Implantable Cardiac Defibrillator)   You have a Abbott ICD  ACTIVITY Do not lift your arm above shoulder height for 1 week after your procedure. After 7 days, you may progress as below.  You should remove your sling 24 hours after your procedure, unless otherwise instructed by your provider.     Tuesday January 13, 2024  Wednesday January 14, 2024 Thursday January 15, 2024 Friday January 16, 2024   Do not lift, push, pull, or carry anything over 10 pounds with the affected arm until 6 weeks (Tuesday February 17, 2024 ) after your procedure.   You may drive AFTER your wound check, unless you have been told otherwise by your provider.   Ask your healthcare provider when you can go back to work   INCISION/Dressing HOLD Eliquis  post procedure. Resume on am of 01/11/24    Do not remove steri-strips or glue as below.   Monitor your defibrillator site for redness, swelling, and drainage. Call the device clinic at [phone] if you experience these symptoms or fever/chills.  If your incision is sealed with Steri-strips or staples, you may shower 7 days after your procedure or when told by your provider. Do not remove the steri-strips or let the shower hit directly on your site. You may wash around your site with soap and water.    If you were discharged in a sling, please do not wear this during the day more than 48 hours after your surgery unless otherwise instructed. This may increase the risk of stiffness and soreness in your shoulder.   Avoid lotions, ointments, or perfumes over your incision until it is well-healed.  You may use a hot tub or a pool AFTER your wound check appointment if the incision is completely closed.  Your ICD is designed to protect you from life threatening heart rhythms. Because of this, you may receive a shock.   1 shock with no symptoms:  Call the office during business hours. 1 shock with symptoms (chest pain, chest pressure, dizziness,  lightheadedness, shortness of breath, overall feeling unwell):  Call 911. If you experience 2 or more shocks in 24 hours:  Call 911. If you receive a shock, you should not drive for 6 months per the [place] DMV IF you receive appropriate therapy from your ICD.   ICD Alerts:  Some alerts are vibratory and others beep. These are NOT emergencies. Please call our office to let us  know. If this occurs at night or on weekends, it can wait until the next business day. Send a remote transmission.  If your device is capable of reading fluid status (for heart failure), you will be offered monthly monitoring to review this with you.   DEVICE MANAGEMENT Remote monitoring is used to monitor your ICD from home. This monitoring is scheduled every 91 days by our office. It allows us  to keep an eye on the functioning of your device to ensure it is working properly. You will routinely see your Electrophysiologist annually (more often if necessary).   You should receive your ID card for your new device in 4-8 weeks. Keep this card with you at all times once received. Consider wearing a medical alert bracelet or necklace.  Your ICD  may be MRI compatible. This will be discussed at your next office visit/wound check.  You should avoid contact with strong electric or magnetic fields.   Do not use amateur (ham) radio equipment or electric (arc) welding torches. MP3 player headphones with magnets should  not be used. Some devices are safe to use if held at least 12 inches (30 cm) from your defibrillator. These include power tools, lawn mowers, and speakers. If you are unsure if something is safe to use, ask your health care provider.  When using your cell phone, hold it to the ear that is on the opposite side from the defibrillator. Do not leave your cell phone in a pocket over the defibrillator.  You may safely use electric blankets, heating pads, computers, and microwave ovens.  Call the office right away if: You have  chest pain. You feel more than one shock. You feel more short of breath than you have felt before. You feel more light-headed than you have felt before. Your incision starts to open up.  This information is not intended to replace advice given to you by your health care provider. Make sure you discuss any questions you have with your health care provider.

## 2024-01-07 LAB — HEMOGLOBIN A1C
Hgb A1c MFr Bld: 7.1 % — ABNORMAL HIGH (ref 4.8–5.6)
Mean Plasma Glucose: 157 mg/dL

## 2024-01-09 ENCOUNTER — Other Ambulatory Visit: Payer: Self-pay | Admitting: Medical Genetics

## 2024-01-09 LAB — TYPE AND SCREEN
ABO/RH(D): B POS
Antibody Screen: NEGATIVE
Unit division: 0
Unit division: 0
Unit division: 0
Unit division: 0

## 2024-01-09 LAB — BPAM RBC
Blood Product Expiration Date: 202509012359
Blood Product Expiration Date: 202509022359
Blood Product Expiration Date: 202509022359
Blood Product Expiration Date: 202509022359
ISSUE DATE / TIME: 202508111551
ISSUE DATE / TIME: 202508111551
Unit Type and Rh: 7300
Unit Type and Rh: 7300
Unit Type and Rh: 7300
Unit Type and Rh: 7300

## 2024-01-16 ENCOUNTER — Other Ambulatory Visit: Payer: Self-pay | Admitting: Cardiology

## 2024-01-16 DIAGNOSIS — I420 Dilated cardiomyopathy: Secondary | ICD-10-CM

## 2024-01-16 DIAGNOSIS — I5022 Chronic systolic (congestive) heart failure: Secondary | ICD-10-CM

## 2024-01-20 ENCOUNTER — Ambulatory Visit: Attending: Cardiology

## 2024-01-20 DIAGNOSIS — I428 Other cardiomyopathies: Secondary | ICD-10-CM | POA: Diagnosis not present

## 2024-01-20 LAB — CUP PACEART INCLINIC DEVICE CHECK
Battery Remaining Longevity: 70 mo
Brady Statistic RA Percent Paced: 0.28 %
Brady Statistic RV Percent Paced: 99.97 %
Date Time Interrogation Session: 20250826092356
HighPow Impedance: 51.75 Ohm
Implantable Lead Connection Status: 753985
Implantable Lead Connection Status: 753985
Implantable Lead Connection Status: 753985
Implantable Lead Implant Date: 20231227
Implantable Lead Implant Date: 20231227
Implantable Lead Implant Date: 20250811
Implantable Lead Location: 753858
Implantable Lead Location: 753859
Implantable Lead Location: 753860
Implantable Lead Model: 3830
Implantable Lead Model: 7122
Implantable Pulse Generator Implant Date: 20231227
Lead Channel Impedance Value: 475 Ohm
Lead Channel Impedance Value: 512.5 Ohm
Lead Channel Impedance Value: 512.5 Ohm
Lead Channel Pacing Threshold Amplitude: 0.75 V
Lead Channel Pacing Threshold Amplitude: 0.75 V
Lead Channel Pacing Threshold Amplitude: 0.75 V
Lead Channel Pacing Threshold Amplitude: 0.75 V
Lead Channel Pacing Threshold Amplitude: 1 V
Lead Channel Pacing Threshold Amplitude: 1 V
Lead Channel Pacing Threshold Pulse Width: 0.5 ms
Lead Channel Pacing Threshold Pulse Width: 0.5 ms
Lead Channel Pacing Threshold Pulse Width: 0.5 ms
Lead Channel Pacing Threshold Pulse Width: 0.5 ms
Lead Channel Pacing Threshold Pulse Width: 0.5 ms
Lead Channel Pacing Threshold Pulse Width: 0.5 ms
Lead Channel Sensing Intrinsic Amplitude: 12 mV
Lead Channel Sensing Intrinsic Amplitude: 2.9 mV
Lead Channel Setting Pacing Amplitude: 0.25 V
Lead Channel Setting Pacing Amplitude: 1.625
Lead Channel Setting Pacing Amplitude: 2 V
Lead Channel Setting Pacing Pulse Width: 0.05 ms
Lead Channel Setting Pacing Pulse Width: 0.5 ms
Lead Channel Setting Sensing Sensitivity: 0.5 mV
Pulse Gen Serial Number: 5556392
Zone Setting Status: 755011

## 2024-01-20 NOTE — Progress Notes (Signed)
 Normal multi chamber ICD wound check. Wound well healed. Presenting rhythm: AS/BP 80. Routine testing performed. Thresholds, sensing, and impedance consistent with implant measurements. No treated arrhythmias. Reviewed arm restrictions to continue for 6 weeks total post op. Reviewed shock plan.  Pt enrolled in remote follow-up.

## 2024-01-20 NOTE — Patient Instructions (Signed)
  After Your ICD (Implantable Cardiac Defibrillator)    Monitor your defibrillator site for redness, swelling, and drainage. Call the device clinic at [phone] if you experience these symptoms or fever/chills.  Your incision was closed with Steri-strips or staples:  You may shower 7 days after your procedure and wash your incision with soap and water. Avoid lotions, ointments, or perfumes over your incision until it is well-healed.  You may use a hot tub or a pool after your wound check appointment if the incision is completely closed.  Do not lift, push or pull greater than 10 pounds with the affected arm until SEPTEMBER 22nd. There are no other restrictions in arm movement after your wound check appointment.  Your ICD is designed to protect you from life threatening heart rhythms. Because of this, you may receive a shock.   1 shock with no symptoms:  Call the office during business hours. 1 shock with symptoms (chest pain, chest pressure, dizziness, lightheadedness, shortness of breath, overall feeling unwell):  Call 911. If you experience 2 or more shocks in 24 hours:  Call 911. If you receive a shock, you should not drive.  Learned DMV - no driving for 6 months if you receive appropriate therapy from your ICD.   ICD Alerts:  Some alerts are vibratory and others beep. These are NOT emergencies. Please call our office to let us  know. If this occurs at night or on weekends, it can wait until the next business day. Send a remote transmission.  If your device is capable of reading fluid status (for heart failure), you will be offered monthly monitoring to review this with you.   Remote monitoring is used to monitor your ICD from home. This monitoring is scheduled every 91 days by our office. It allows us  to keep an eye on the functioning of your device to ensure it is working properly. You will routinely see your Electrophysiologist annually (more often if necessary).

## 2024-01-21 ENCOUNTER — Ambulatory Visit: Payer: Self-pay | Admitting: Cardiology

## 2024-01-28 ENCOUNTER — Encounter: Payer: Self-pay | Admitting: Cardiology

## 2024-02-03 ENCOUNTER — Ambulatory Visit (HOSPITAL_BASED_OUTPATIENT_CLINIC_OR_DEPARTMENT_OTHER)
Admission: RE | Admit: 2024-02-03 | Discharge: 2024-02-03 | Disposition: A | Discharge status: Home / Self Care | Source: Ambulatory Visit | Attending: Family Medicine | Admitting: Family Medicine

## 2024-02-03 ENCOUNTER — Encounter (HOSPITAL_BASED_OUTPATIENT_CLINIC_OR_DEPARTMENT_OTHER): Payer: Self-pay

## 2024-02-03 VITALS — BP 137/86 | HR 78 | Temp 99.3°F | Resp 20

## 2024-02-03 DIAGNOSIS — U071 COVID-19: Secondary | ICD-10-CM | POA: Diagnosis not present

## 2024-02-03 DIAGNOSIS — R051 Acute cough: Secondary | ICD-10-CM

## 2024-02-03 LAB — POC COVID19/FLU A&B COMBO
Covid Antigen, POC: POSITIVE — AB
Influenza A Antigen, POC: NEGATIVE
Influenza B Antigen, POC: NEGATIVE

## 2024-02-03 NOTE — Discharge Instructions (Addendum)
 Your COVID test was positive. You can take OTC medications as needed  Follow up as needed

## 2024-02-03 NOTE — ED Provider Notes (Signed)
 [facility]    CSN: [id number] Arrival date & time: 02/03/24  1033      History   Chief Complaint Chief Complaint  Patient presents with   Cough    Cold chills, no fever, stuffy nose chest congestion , headache from cough, light headedness  from coughing started on Saturday as a dry cough and has gotten worse from there - Entered by patient    HPI Shelly Shields is a 43 y.o. female.   Pt reports cough, congestion, cold chills, sore throat, starting Saturday. Denies fever. Has been taking cough syrup (Delsym) , tylenol , and coricidin. Last dose of cough syrup was this morning. Pt is taking Eliquis .      Cough   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 05/22/2022 05/22/2022   Migraines    Obesity    Pulmonary embolism (HCC) 05/2019   with COVID   Sleep apnea     Patient Active Problem List   Diagnosis Date Noted   Cardiac resynchronization therapy defibrillator (CRT-D) in place 01/05/2024   Encounter for assessment of implantable cardioverter-defibrillator (ICD) 05/22/2022   ICD  A[employer] Unify Asura Biventricular implantable cardioverter-defibrillator  (CRT-D) in situ 05/22/2022 05/22/2022   Pacemaker 05/22/2022   NICM (nonischemic cardiomyopathy) (HCC) 04/23/2022   LBBB (left bundle branch block) 04/23/2022   Chronic systolic heart failure (HCC) 03/26/2021   Acute CVA (cerebrovascular accident) (HCC) 03/27/2020   Sleep apnea    Diabetes mellitus without complication (HCC)    COVID-19 long hauler    Obesity    Acute pulmonary embolism (HCC) 06/30/2019   Pulmonary embolism (HCC) 05/2019   Hypernatremia    Asthma, chronic    DKA  (diabetic ketoacidosis) (HCC) 11/27/2014   Diabetes mellitus, new onset (HCC)    Hyperglycemic hyperosmolar nonketotic coma (HCC)    Essential hypertension     Past Surgical History:  Procedure Laterality Date   BILATERAL CARPAL TUNNEL RELEASE     BIV ICD INSERTION CRT-D N/A 05/22/2022   Procedure: BIV ICD INSERTION CRT-D;  Surgeon: Fernande Elspeth BROCKS, MD;  Location: [facility] INVASIVE CV LAB;  Service: Cardiovascular;  Laterality: N/A;   LEAD EXTRACTION N/A 01/05/2024   Procedure: LEAD EXTRACTION;  Surgeon: Cindie Ole DASEN, MD;  Location: [facility] INVASIVE CV LAB;  Service: Cardiovascular;  Laterality: N/A;   LEAD INSERTION N/A 01/05/2024   Procedure: LEAD INSERTION;  Surgeon: Cindie Ole DASEN, MD;  Location: [facility] INVASIVE CV LAB;  Service: Cardiovascular;  Laterality: N/A;   LEAD REVISION/REPAIR N/A 05/22/2022   Procedure: LEAD REVISION/REPAIR;  Surgeon: Fernande Elspeth BROCKS, MD;  Location: [facility] INVASIVE CV LAB;  Service: Cardiovascular;  Laterality: N/A;   LEFT HEART CATH AND CORONARY ANGIOGRAPHY N/A 03/27/2021   Procedure: LEFT HEART CATH AND CORONARY ANGIOGRAPHY;  Surgeon: Elmira Newman PARAS, MD;  Location: [facility] INVASIVE CV LAB;  Service: Cardiovascular;  Laterality: N/A;   TEE WITHOUT CARDIOVERSION N/A 03/28/2020   Procedure: TRANSESOPHAGEAL ECHOCARDIOGRAM (TEE);  Surgeon: Ladona Heinz, MD;  Location: [facility] ENDOSCOPY;  Service: Cardiovascular;  Laterality: N/A;   triger finger release      OB History   No obstetric history on file.      Home Medications    Prior  to Admission medications   Medication Sig Start Date End Date Taking? Authorizing Provider  acetaminophen  (TYLENOL ) 325 MG tablet Take 1-2 tablets (325-650 mg total) by mouth every 4 (four) hours as needed for mild pain (pain score 1-3). 01/06/24   Aniceto Daphne CROME, NP  Albuterol -Budesonide  (AIRSUPRA) 90-80 MCG/ACT AERO Inhale 2 puffs into the lungs every 6 (six) hours as needed (shortness of breath or wheezing).    [provider]   apixaban  (ELIQUIS ) 5 MG TABS tablet Take 1 tablet (5 mg total) by mouth 2 (two) times daily. Resume on 11/2 morning 03/27/21   Patwardhan, Newman PARAS, MD  Baclofen 5 MG TABS Take 5-10 mg by mouth daily as needed (Migraines). 08/09/19   [provider]  Blood Glucose Monitoring Suppl (ACCU-CHEK NANO SMARTVIEW) W/DEVICE KIT 1 Device by Does not apply route 4 (four) times daily - after meals and at bedtime. 11/29/14   Adamo, Elena M, MD  butalbital -acetaminophen -caffeine  (FIORICET ) 50-325-40 MG tablet Take 1 tablet by mouth every 6 (six) hours as needed for headache. 12/11/21   [provider]  carvedilol  (COREG ) 25 MG tablet TAKE 2 TABLETS(50 MG) BY MOUTH TWICE DAILY WITH A MEAL 01/20/24   Ladona Heinz, MD  Continuous Blood Gluc Transmit (DEXCOM G6 TRANSMITTER) MISC See admin instructions. 08/16/20   [provider]  Continuous Glucose Sensor (DEXCOM G7 SENSOR) MISC by Does not apply route.    [provider]  Cyanocobalamin (B-12) 1000 MCG SUBL Place 1,000 mcg under the tongue daily.    [provider]  escitalopram  (LEXAPRO ) 10 MG tablet Take 10 mg by mouth daily. 03/01/20   [provider]  fluticasone (FLONASE) 50 MCG/ACT nasal spray Place 2 sprays into both nostrils at bedtime. 09/22/14   [provider]  gabapentin  (NEURONTIN ) 300 MG capsule Take 600 mg by mouth 3 (three) times daily as needed (pain). 02/22/22   [provider]  glucose blood (ACCU-CHEK SMARTVIEW) test strip Check sugar 6 x daily 11/29/14   Adamo, Elena M, MD  glycopyrrolate (ROBINUL) 1 MG tablet Take 1 mg by mouth 3 (three) times daily as needed (stomach acid). 09/12/22   [provider]  HUMALOG KWIKPEN 200 UNIT/ML KwikPen Inject 10-50 Units into the skin See admin instructions. Via Insulin  Pump 09/06/21   [provider]  hydrOXYzine  (ATARAX ) 10 MG tablet Take 1 tablet (10 mg total) by mouth 3 (three) times daily as needed. Patient taking differently:  Take 10 mg by mouth 3 (three) times daily as needed for itching or anxiety. 10/24/22   Ladona Heinz, MD  Insulin  Disposable Pump (OMNIPOD DASH PODS, GEN 4,) MISC 200 Units every other day. Humalog 02/23/21   [provider]  Insulin  Pen Needle (INSUPEN PEN NEEDLES) 32G X 4 MM MISC BD Pen Needles- brand specific. Inject insulin  via insulin  pen daily 11/29/14   Adamo, Elena M, MD  ivabradine  (CORLANOR ) 7.5 MG TABS tablet Take 1 tablet (7.5 mg total) by mouth 2 (two) times daily with a meal. 08/26/23   Ladona Heinz, MD  ketotifen  (ZADITOR ) 0.035 % ophthalmic solution Place 1 drop into both eyes daily as needed (allergies).    [provider]  Lancet Devices [facility]) lancets Use as instructed for blood glucose checks four times daily, before meals and at bedtime 11/29/14   Adamo, Elena M, MD  levocetirizine (XYZAL ) 5 MG tablet Take 5 mg by mouth every evening.    [provider]  medroxyPROGESTERone (DEPO-PROVERA) 150 MG/ML injection Inject 150 mg  into the muscle every 3 (three) months.    [provider]  montelukast (SINGULAIR) 10 MG tablet Take 10 mg by mouth at bedtime.    [provider]  Multiple Vitamin (MULTI-VITAMIN) tablet Take 1 tablet by mouth daily.    [provider]  nortriptyline  (PAMELOR ) 10 MG capsule Take 30 mg by mouth at bedtime.    [provider]  NURTEC 75 MG TBDP Take 75 mg by mouth daily as needed (migraines). 04/02/21   [provider]  nystatin  cream (MYCOSTATIN ) Apply to affected area 2 times daily Patient not taking: Reported on 12/31/2023 04/20/23   Rising, Asberry, PA-C  ondansetron  (ZOFRAN ) 4 MG tablet Take 4 mg by mouth every 8 (eight) hours as needed for nausea or vomiting. 10/29/23   [provider]  ondansetron  (ZOFRAN -ODT) 4 MG disintegrating tablet Take 1 tablet (4 mg total) by mouth every 8 (eight) hours as needed for nausea or vomiting. Patient not taking: Reported on 12/31/2023 09/09/21    Hildegard Loge, PA-C  pantoprazole  (PROTONIX ) 40 MG tablet Take 40 mg by mouth daily.    [provider]  potassium chloride  (KLOR-CON ) 10 MEQ tablet Take 1 tablet (10 mEq total) by mouth daily. Patient taking differently: Take 10 mEq by mouth 2 (two) times daily. 08/19/23 12/31/23  Ladona Heinz, MD  rosuvastatin  (CRESTOR ) 20 MG tablet Take 1 tablet (20 mg total) by mouth daily at 6 PM. 03/30/20   Elgergawy, Brayton RAMAN, MD  sacubitril -valsartan  (ENTRESTO ) 97-103 MG TAKE 1 TABLET BY MOUTH TWICE DAILY 07/03/23   Ladona Heinz, MD  spironolactone  (ALDACTONE ) 25 MG tablet TAKE 1 TABLET(25 MG) BY MOUTH DAILY 10/30/23   Ladona Heinz, MD  topiramate  (TOPAMAX ) 100 MG tablet Take 200 mg by mouth daily.    [provider]  torsemide  (DEMADEX ) 20 MG tablet Take 20 mg by mouth See admin instructions. Take 20 mg by mouth daily, make take an additional 20 mg in afternoon if weight up > 3 Lbs in 3 days 12/12/22   Ladona Heinz, MD  TRUEPLUS LANCETS 33G MISC USE TO CHECK BLOOD GLUCOSE QID - AFTER MEALS AND AT BEDTIME 12/22/14   [provider]    Family History Family History  Problem Relation Age of Onset   Hypertension Mother    Thyroid disease Mother    Hypertension Father    Stroke Father 74   Diabetes Father    Sleep apnea Father    Colon cancer Maternal Uncle 60   Aneurysm Paternal Aunt 41   CVA Paternal Uncle 27   Cancer - Lung Maternal Grandmother    Heart failure Paternal Grandfather     Social History Social History   Tobacco Use   Smoking status: Never   Smokeless tobacco: Never  Vaping Use   Vaping status: Never Used  Substance Use Topics   Alcohol use: Yes    Alcohol/week: 0.0 standard drinks of alcohol    Comment: occasional    Drug use: No     Allergies   Farxiga [dapagliflozin], Ibuprofen, Ketorolac, Lisinopril, and Metformin    Review of Systems Review of Systems  Respiratory:  Positive for cough.      Physical Exam Triage Vital Signs ED Triage Vitals   Encounter Vitals Group     BP 02/03/24 1050 137/86     Girls Systolic BP Percentile --      Girls Diastolic BP Percentile --      Boys Systolic BP Percentile --  Boys Diastolic BP Percentile --      Pulse Rate 02/03/24 1050 78     Resp 02/03/24 1050 20     Temp 02/03/24 1050 99.3 F (37.4 C)     Temp Source 02/03/24 1050 Oral     SpO2 02/03/24 1050 99 %     Weight --      Height --      Head Circumference --      Peak Flow --      Pain Score 02/03/24 1057 3     Pain Loc --      Pain Education --      Exclude from Growth Chart --    No data found.  Updated Vital Signs BP 137/86 (BP Location: Left Arm)   Pulse 78   Temp 99.3 F (37.4 C) (Oral)   Resp 20   SpO2 99%   Visual Acuity Right Eye Distance:   Left Eye Distance:   Bilateral Distance:    Right Eye Near:   Left Eye Near:    Bilateral Near:     Physical Exam Constitutional:      General: She is not in acute distress.    Appearance: Normal appearance. She is not ill-appearing, toxic-appearing or diaphoretic.  HENT:     Head: Normocephalic and atraumatic.     Right Ear: Tympanic membrane and ear canal normal.     Left Ear: Tympanic membrane and ear canal normal.     Nose: Congestion and rhinorrhea present.     Mouth/Throat:     Pharynx: Oropharynx is clear.  Eyes:     Conjunctiva/sclera: Conjunctivae normal.  Cardiovascular:     Rate and Rhythm: Normal rate and regular rhythm.     Pulses: Normal pulses.     Heart sounds: Normal heart sounds.  Pulmonary:     Effort: Pulmonary effort is normal.     Breath sounds: Normal breath sounds.  Skin:    General: Skin is warm and dry.  Neurological:     Mental Status: She is alert.  Psychiatric:        Mood and Affect: Mood normal.      UC Treatments / Results  Labs (all labs ordered are listed, but only abnormal results are displayed) Labs Reviewed  POC COVID19/FLU A&B COMBO - Abnormal; Notable for the following components:      Result Value    Covid Antigen, POC Positive (*)    All other components within normal limits    EKG   Radiology No results found.  Procedures Procedures (including critical care time)  Medications Ordered in UC Medications - No data to display  Initial Impression / Assessment and Plan / UC Course  I have reviewed the triage vital signs and the nursing notes.  Pertinent labs & imaging results that were available during my care of the patient were reviewed by me and considered in my medical decision making (see chart for details).     COVID-19-COVID test positive here today.  Recommend over-the-counter symptomatic treatment as needed.  Rest, hydrate and follow-up as needed Final Clinical Impressions(s) / UC Diagnoses   Final diagnoses:  Acute cough  COVID-19     Discharge Instructions      Your COVID test was positive. You can take OTC medications as needed  Follow up as needed      ED Prescriptions   None    PDMP not reviewed this encounter.   Adah Wilbert LABOR, FNP 02/03/24 1131

## 2024-02-03 NOTE — ED Triage Notes (Addendum)
 Pt reports cough, congestion, cold chills, sore throat, starting Saturday. Denies fever. Has been taking cough syrup (Delsym) , tylenol , and coricidin. Last dose of cough syrup was this morning. Pt is taking Eliquis .

## 2024-02-11 NOTE — Progress Notes (Signed)
Remote ICD Transmission.

## 2024-02-18 ENCOUNTER — Ambulatory Visit

## 2024-02-18 DIAGNOSIS — I428 Other cardiomyopathies: Secondary | ICD-10-CM | POA: Diagnosis not present

## 2024-02-19 LAB — CUP PACEART REMOTE DEVICE CHECK
Battery Remaining Longevity: 68 mo
Battery Remaining Percentage: 71 %
Battery Voltage: 2.99 V
Brady Statistic AP VP Percent: 1 %
Brady Statistic AP VS Percent: 1 %
Brady Statistic AS VP Percent: 99 %
Brady Statistic AS VS Percent: 1 %
Brady Statistic RA Percent Paced: 1 %
Date Time Interrogation Session: 20250924020018
HighPow Impedance: 64 Ohm
HighPow Impedance: 64 Ohm
Implantable Lead Connection Status: 753985
Implantable Lead Connection Status: 753985
Implantable Lead Connection Status: 753985
Implantable Lead Implant Date: 20231227
Implantable Lead Implant Date: 20231227
Implantable Lead Implant Date: 20250811
Implantable Lead Location: 753858
Implantable Lead Location: 753859
Implantable Lead Location: 753860
Implantable Lead Model: 3830
Implantable Lead Model: 7122
Implantable Pulse Generator Implant Date: 20231227
Lead Channel Impedance Value: 450 Ohm
Lead Channel Impedance Value: 510 Ohm
Lead Channel Impedance Value: 550 Ohm
Lead Channel Pacing Threshold Amplitude: 0.75 V
Lead Channel Pacing Threshold Amplitude: 0.75 V
Lead Channel Pacing Threshold Amplitude: 1 V
Lead Channel Pacing Threshold Pulse Width: 0.5 ms
Lead Channel Pacing Threshold Pulse Width: 0.5 ms
Lead Channel Pacing Threshold Pulse Width: 0.5 ms
Lead Channel Sensing Intrinsic Amplitude: 12 mV
Lead Channel Sensing Intrinsic Amplitude: 3.2 mV
Lead Channel Setting Pacing Amplitude: 0.25 V
Lead Channel Setting Pacing Amplitude: 1.75 V
Lead Channel Setting Pacing Amplitude: 2 V
Lead Channel Setting Pacing Pulse Width: 0.05 ms
Lead Channel Setting Pacing Pulse Width: 0.5 ms
Lead Channel Setting Sensing Sensitivity: 0.5 mV
Pulse Gen Serial Number: 5556392
Zone Setting Status: 755011

## 2024-02-20 ENCOUNTER — Ambulatory Visit: Payer: Self-pay | Admitting: Cardiology

## 2024-02-20 NOTE — Progress Notes (Signed)
Remote ICD Transmission.

## 2024-03-13 ENCOUNTER — Other Ambulatory Visit: Payer: Self-pay | Admitting: Cardiology

## 2024-03-13 DIAGNOSIS — I5022 Chronic systolic (congestive) heart failure: Secondary | ICD-10-CM

## 2024-03-16 ENCOUNTER — Other Ambulatory Visit: Payer: Self-pay | Admitting: Medical Genetics

## 2024-03-16 DIAGNOSIS — Z006 Encounter for examination for normal comparison and control in clinical research program: Secondary | ICD-10-CM

## 2024-04-11 ENCOUNTER — Encounter (HOSPITAL_BASED_OUTPATIENT_CLINIC_OR_DEPARTMENT_OTHER): Payer: Self-pay

## 2024-04-11 ENCOUNTER — Ambulatory Visit (HOSPITAL_BASED_OUTPATIENT_CLINIC_OR_DEPARTMENT_OTHER)
Admission: RE | Admit: 2024-04-11 | Discharge: 2024-04-11 | Disposition: A | Discharge status: Home / Self Care | Source: Ambulatory Visit | Attending: Physician Assistant | Admitting: Physician Assistant

## 2024-04-11 VITALS — BP 137/75 | HR 71 | Temp 98.7°F | Resp 20

## 2024-04-11 DIAGNOSIS — N3000 Acute cystitis without hematuria: Secondary | ICD-10-CM | POA: Diagnosis present

## 2024-04-11 LAB — POCT URINE DIPSTICK
Bilirubin, UA: NEGATIVE
Blood, UA: NEGATIVE
Glucose, UA: NEGATIVE mg/dL
Ketones, POC UA: NEGATIVE mg/dL
Leukocytes, UA: NEGATIVE
Nitrite, UA: POSITIVE — AB
Protein Ur, POC: NEGATIVE mg/dL
Spec Grav, UA: 1.025 (ref 1.010–1.025)
Urobilinogen, UA: 1 U/dL
pH, UA: 5.5 (ref 5.0–8.0)

## 2024-04-11 MED ORDER — NITROFURANTOIN MONOHYD MACRO 100 MG PO CAPS: 100.0000 mg | ORAL_CAPSULE | Freq: Two times a day (BID) | ORAL | 0 refills | Status: AC

## 2024-04-11 NOTE — ED Provider Notes (Signed)
 [facility]    CSN: [id number] Arrival date & time: 04/11/24  1016      History   Chief Complaint Chief Complaint  Patient presents with   Urinary Frequency    Pain with urination, nausea - Entered by patient   Nausea    HPI Shelly Shields is a 43 y.o. female.   Patient presents with 4 days of dysuria, abdominal pressure, increased urinary frequency.  She reports some nausea but denies vomiting.  No fevers.    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 05/22/2022 05/22/2022   Migraines    Obesity    Pulmonary embolism (HCC) 05/2019   with COVID   Sleep apnea     Patient Active Problem List   Diagnosis Date Noted   Cardiac resynchronization therapy defibrillator (CRT-D) in place 01/05/2024   Encounter for assessment of implantable cardioverter-defibrillator (ICD) 05/22/2022   ICD  Abbott Unify Asura Biventricular implantable cardioverter-defibrillator  (CRT-D) in situ 05/22/2022 05/22/2022   Pacemaker 05/22/2022   NICM (nonischemic cardiomyopathy) (HCC) 04/23/2022   LBBB (left bundle branch block) 04/23/2022   Chronic systolic heart failure (HCC) 03/26/2021   Acute CVA (cerebrovascular accident) (HCC) 03/27/2020   Sleep apnea    Diabetes mellitus without complication (HCC)    COVID-19 long hauler    Obesity    Acute pulmonary embolism (HCC) 06/30/2019   Pulmonary embolism (HCC) 05/2019   Hypernatremia    Asthma, chronic    DKA (diabetic ketoacidosis) (HCC) 11/27/2014   Diabetes mellitus, new onset (HCC)    Hyperglycemic hyperosmolar nonketotic coma (HCC)    Essential hypertension     Past Surgical History:  Procedure  Laterality Date   BILATERAL CARPAL TUNNEL RELEASE     BIV ICD INSERTION CRT-D N/A 05/22/2022   Procedure: BIV ICD INSERTION CRT-D;  Surgeon: Fernande Elspeth BROCKS, MD;  Location: [facility] INVASIVE CV LAB;  Service: Cardiovascular;  Laterality: N/A;   LEAD EXTRACTION N/A 01/05/2024   Procedure: LEAD EXTRACTION;  Surgeon: Cindie Ole DASEN, MD;  Location: [facility] INVASIVE CV LAB;  Service: Cardiovascular;  Laterality: N/A;   LEAD INSERTION N/A 01/05/2024   Procedure: LEAD INSERTION;  Surgeon: Cindie Ole DASEN, MD;  Location: [facility] INVASIVE CV LAB;  Service: Cardiovascular;  Laterality: N/A;   LEAD REVISION/REPAIR N/A 05/22/2022   Procedure: LEAD REVISION/REPAIR;  Surgeon: Fernande Elspeth BROCKS, MD;  Location: [facility] INVASIVE CV LAB;  Service: Cardiovascular;  Laterality: N/A;   LEFT HEART CATH AND CORONARY ANGIOGRAPHY N/A 03/27/2021   Procedure: LEFT HEART CATH AND CORONARY ANGIOGRAPHY;  Surgeon: Elmira Newman PARAS, MD;  Location: [facility] INVASIVE CV LAB;  Service: Cardiovascular;  Laterality: N/A;   TEE WITHOUT CARDIOVERSION N/A 03/28/2020   Procedure: TRANSESOPHAGEAL ECHOCARDIOGRAM (TEE);  Surgeon: Ladona Heinz, MD;  Location: [facility] ENDOSCOPY;  Service: Cardiovascular;  Laterality: N/A;   triger finger release      OB History   No obstetric history on file.      Home Medications    Prior to Admission medications   Medication Sig Start Date End Date Taking? Authorizing Provider  carvedilol  (COREG ) 25 MG tablet TAKE 2 TABLETS(50 MG) BY MOUTH TWICE DAILY WITH A MEAL 01/20/24  Yes Ladona Heinz, MD  Continuous Glucose Sensor (DEXCOM G7 SENSOR) MISC by Does not apply route.   Yes [provider]  Cyanocobalamin (B-12) 1000 MCG SUBL Place 1,000 mcg under the tongue daily.   Yes [provider]  escitalopram  (LEXAPRO ) 10 MG tablet Take 10 mg by mouth daily. 03/01/20  Yes [provider]  fluticasone (FLONASE) 50 MCG/ACT nasal spray Place 2 sprays into both nostrils at bedtime. 09/22/14  Yes [provider]  gabapentin  (NEURONTIN ) 300 MG capsule Take 600 mg by mouth 3 (three) times daily as needed (pain). 02/22/22  Yes [provider]  HUMALOG KWIKPEN 200 UNIT/ML KwikPen Inject 10-50 Units into the skin See admin instructions. Via Insulin  Pump 09/06/21  Yes [provider]  ivabradine  (CORLANOR ) 7.5 MG TABS tablet Take 1 tablet (7.5 mg total) by mouth 2 (two) times daily with a meal. 08/26/23  Yes Ladona Heinz, MD  medroxyPROGESTERone (DEPO-PROVERA) 150 MG/ML injection Inject 150 mg into the muscle every 3 (three) months.   Yes [provider]  montelukast (SINGULAIR) 10 MG tablet Take 10 mg by mouth at bedtime.   Yes [provider]  nitrofurantoin, macrocrystal-monohydrate, (MACROBID) 100 MG capsule Take 1 capsule (100 mg total) by mouth 2 (two) times daily. 04/11/24  Yes Ward, Harlene PEDLAR, PA-C  potassium chloride  (KLOR-CON ) 10 MEQ tablet Take 1 tablet (10 mEq total) by mouth daily. Patient taking differently: Take 10 mEq by mouth 2 (two) times daily. 08/19/23 04/11/24 Yes Ladona Heinz, MD  rosuvastatin  (CRESTOR ) 20 MG tablet Take 1 tablet (20 mg total) by mouth daily at 6 PM. 03/30/20  Yes Elgergawy, Brayton RAMAN, MD  sacubitril -valsartan  (ENTRESTO ) 97-103 MG TAKE 1 TABLET BY MOUTH TWICE DAILY 07/03/23  Yes Ladona Heinz, MD  spironolactone  (ALDACTONE ) 25 MG tablet TAKE 1 TABLET(25 MG) BY MOUTH DAILY 10/30/23  Yes Ladona Heinz, MD  topiramate  (TOPAMAX ) 100 MG tablet Take 200 mg by mouth daily.   Yes [provider]  acetaminophen  (TYLENOL ) 325 MG tablet Take 1-2 tablets (325-650 mg total) by mouth every 4 (four) hours as needed for mild pain (pain score 1-3). 01/06/24   Aniceto Daphne CROME, NP  Albuterol -Budesonide  (AIRSUPRA) 90-80 MCG/ACT AERO Inhale 2 puffs into the lungs every 6 (six) hours as needed (shortness of breath or wheezing).    [provider]  apixaban  (ELIQUIS ) 5 MG TABS tablet Take 1 tablet (5 mg total) by mouth 2 (two) times daily. Resume on  11/2 morning 03/27/21   Patwardhan, Newman PARAS, MD  Baclofen 5 MG TABS Take 5-10 mg by mouth daily as needed (Migraines). 08/09/19   [provider]  Blood Glucose Monitoring Suppl (ACCU-CHEK NANO SMARTVIEW) W/DEVICE KIT 1 Device by Does not apply route 4 (four) times daily - after meals and at bedtime. 11/29/14   Adamo, Elena M, MD  butalbital -acetaminophen -caffeine  (FIORICET ) 50-325-40 MG tablet Take 1 tablet by mouth every 6 (six) hours as needed for headache. 12/11/21   [provider]  Continuous Blood Gluc Transmit (DEXCOM G6 TRANSMITTER) MISC See admin instructions. 08/16/20   [provider]  glucose blood (ACCU-CHEK SMARTVIEW) test strip Check sugar 6 x daily 11/29/14   Adamo, Elena M, MD  glycopyrrolate (ROBINUL) 1 MG tablet Take 1 mg by mouth 3 (three) times daily as needed (stomach acid). 09/12/22   [provider]  hydrOXYzine  (ATARAX ) 10 MG tablet Take 1 tablet (10 mg total) by mouth 3 (three) times daily as needed. Patient taking differently: Take 10 mg by mouth 3 (three) times daily  as needed for itching or anxiety. 10/24/22   Ladona Heinz, MD  Insulin  Disposable Pump (OMNIPOD DASH PODS, GEN 4,) MISC 200 Units every other day. Humalog 02/23/21   [provider]  Insulin  Pen Needle (INSUPEN PEN NEEDLES) 32G X 4 MM MISC BD Pen Needles- brand specific. Inject insulin  via insulin  pen daily 11/29/14   Adamo, Elena M, MD  ketotifen  (ZADITOR ) 0.035 % ophthalmic solution Place 1 drop into both eyes daily as needed (allergies).    [provider]  Lancet Devices [facility]) lancets Use as instructed for blood glucose checks four times daily, before meals and at bedtime 11/29/14   Adamo, Elena M, MD  levocetirizine (XYZAL ) 5 MG tablet Take 5 mg by mouth every evening.    [provider]  Multiple Vitamin (MULTI-VITAMIN) tablet Take 1 tablet by mouth daily.    [provider]  nortriptyline  (PAMELOR ) 10 MG capsule Take 30 mg by mouth  at bedtime.    [provider]  NURTEC 75 MG TBDP Take 75 mg by mouth daily as needed (migraines). 04/02/21   [provider]  nystatin  cream (MYCOSTATIN ) Apply to affected area 2 times daily Patient not taking: Reported on 12/31/2023 04/20/23   Rising, Asberry, PA-C  ondansetron  (ZOFRAN ) 4 MG tablet Take 4 mg by mouth every 8 (eight) hours as needed for nausea or vomiting. 10/29/23   [provider]  ondansetron  (ZOFRAN -ODT) 4 MG disintegrating tablet Take 1 tablet (4 mg total) by mouth every 8 (eight) hours as needed for nausea or vomiting. Patient not taking: Reported on 12/31/2023 09/09/21   Hildegard Loge, PA-C  pantoprazole  (PROTONIX ) 40 MG tablet Take 40 mg by mouth daily.    [provider]  torsemide  (DEMADEX ) 20 MG tablet TAKE 1 TABLET BY MOUTH AS DIRECTED. ONE TABLET DAILY IN THE MORNING. TAKE ADDITIONAL IN AFTERNOON IF WEIGHT UP GREATER THAN 3 LBS IN 3 DAYS 03/16/24   Ladona Heinz, MD  TRUEPLUS LANCETS 33G MISC USE TO CHECK BLOOD GLUCOSE QID - AFTER MEALS AND AT BEDTIME 12/22/14   [provider]    Family History Family History  Problem Relation Age of Onset   Hypertension Mother    Thyroid disease Mother    Hypertension Father    Stroke Father 38   Diabetes Father    Sleep apnea Father    Colon cancer Maternal Uncle 60   Aneurysm Paternal Aunt 40   CVA Paternal Uncle 58   Cancer - Lung Maternal Grandmother    Heart failure Paternal Grandfather     Social History Social History   Tobacco Use   Smoking status: Never   Smokeless tobacco: Never  Vaping Use   Vaping status: Never Used  Substance Use Topics   Alcohol use: Yes    Alcohol/week: 0.0 standard drinks of alcohol    Comment: occasional    Drug use: No     Allergies   Farxiga [dapagliflozin], Ibuprofen, Ketorolac, Lisinopril, and Metformin    Review of Systems Review of Systems  Constitutional:  Negative for chills and fever.  HENT:  Negative for ear pain and sore  throat.   Eyes:  Negative for pain and visual disturbance.  Respiratory:  Negative for cough and shortness of breath.   Cardiovascular:  Negative for chest pain and palpitations.  Gastrointestinal:  Negative for abdominal pain and vomiting.  Genitourinary:  Positive for difficulty urinating and dysuria. Negative for hematuria.  Musculoskeletal:  Negative for arthralgias and back pain.  Skin:  Negative for color change and rash.  Neurological:  Negative for seizures and syncope.  All other systems reviewed and are negative.    Physical Exam Triage Vital Signs ED Triage Vitals  Encounter Vitals Group     BP 04/11/24 1029 137/75     Girls Systolic BP Percentile --      Girls Diastolic BP Percentile --      Boys Systolic BP Percentile --      Boys Diastolic BP Percentile --      Pulse Rate 04/11/24 1029 71     Resp 04/11/24 1029 20     Temp 04/11/24 1029 98.7 F (37.1 C)     Temp Source 04/11/24 1029 Oral     SpO2 04/11/24 1029 98 %     Weight --      Height --      Head Circumference --      Peak Flow --      Pain Score 04/11/24 1026 0     Pain Loc --      Pain Education --      Exclude from Growth Chart --    No data found.  Updated Vital Signs BP 137/75 (BP Location: Right Wrist)   Pulse 71   Temp 98.7 F (37.1 C) (Oral)   Resp 20   SpO2 98%   Visual Acuity Right Eye Distance:   Left Eye Distance:   Bilateral Distance:    Right Eye Near:   Left Eye Near:    Bilateral Near:     Physical Exam Vitals and nursing note reviewed.  Constitutional:      General: She is not in acute distress.    Appearance: She is well-developed.  HENT:     Head: Normocephalic and atraumatic.  Eyes:     Conjunctiva/sclera: Conjunctivae normal.  Cardiovascular:     Rate and Rhythm: Normal rate and regular rhythm.     Heart sounds: No murmur heard. Pulmonary:     Effort: Pulmonary effort is normal. No respiratory distress.     Breath sounds: Normal breath sounds.  Abdominal:      Palpations: Abdomen is soft.     Tenderness: There is no abdominal tenderness.  Musculoskeletal:        General: No swelling.     Cervical back: Neck supple.  Skin:    General: Skin is warm and dry.     Capillary Refill: Capillary refill takes less than 2 seconds.  Neurological:     Mental Status: She is alert.  Psychiatric:        Mood and Affect: Mood normal.      UC Treatments / Results  Labs (all labs ordered are listed, but only abnormal results are displayed) Labs Reviewed  POCT URINE DIPSTICK - Abnormal; Notable for the following components:      Result Value   Color, UA orange (*)    Nitrite, UA Positive (*)    All other components within normal limits  URINE CULTURE    EKG   Radiology No results found.  Procedures Procedures (including critical care time)  Medications Ordered in UC Medications - No data to display  Initial Impression / Assessment and Plan / UC Course  I have reviewed the triage vital signs and the nursing notes.  Pertinent labs & imaging results that were available during my care of the patient were reviewed by me and considered in my medical decision making (see chart for details).  Will treat UTI.  Antibiotic prescribed.  Supportive care discussed.  Urine culture pending will change treatment treatment plan if needed.  Return precautions discussed. Final Clinical Impressions(s) / UC Diagnoses   Final diagnoses:  Acute cystitis without hematuria     Discharge Instructions      Take antibiotic as prescribed. Drink plenty of fluids Will call if needed with your urine culture results.   ED Prescriptions     Medication Sig Dispense Auth. Provider   nitrofurantoin, macrocrystal-monohydrate, (MACROBID) 100 MG capsule Take 1 capsule (100 mg total) by mouth 2 (two) times daily. 10 capsule Ward, Betzaida Cremeens Z, PA-C      PDMP not reviewed this encounter.   Ward, Harlene PEDLAR, PA-C 04/11/24 1051

## 2024-04-11 NOTE — Discharge Instructions (Signed)
 Take antibiotic as prescribed. Drink plenty of fluids Will call if needed with your urine culture results.

## 2024-04-11 NOTE — ED Triage Notes (Signed)
 Pt c/o urinary burning and frequency, nausea started Thursday.

## 2024-04-12 ENCOUNTER — Ambulatory Visit (HOSPITAL_COMMUNITY): Payer: Self-pay

## 2024-04-12 ENCOUNTER — Telehealth (HOSPITAL_BASED_OUTPATIENT_CLINIC_OR_DEPARTMENT_OTHER): Payer: Self-pay | Admitting: *Deleted

## 2024-04-12 LAB — URINE CULTURE: Culture: NO GROWTH

## 2024-04-12 NOTE — Telephone Encounter (Signed)
   Pre-operative Risk Assessment    Patient Name: Shelly Shields  DOB: 1980/09/29 MRN: [id number]   Date of last office visit: 11/14/23 DR. LAMBERT Date of next office visit: 04/14/24 DR. LAMBERT   Request for Surgical Clearance    Procedure:  LEFT SHOULDER ARTHROSCOPIC BICEPS TENOTOMY   Date of Surgery:  Clearance 05/07/24                                Surgeon:  DR. REYES BOER Surgeon's Group or Practice Name:  [facility] Phone number:  [phone] Fax number:  [phone]   Type of Clearance Requested:   - Medical  - Pharmacy:  Hold Apixaban  (Eliquis )     Type of Anesthesia:  General    Additional requests/questions:    Bonney Niels Jest   04/12/2024, 5:43 PM

## 2024-04-13 NOTE — Telephone Encounter (Signed)
   Name: LARENE ASCENCIO  DOB: [birth year]  MRN: [id number]  Primary Cardiologist: Gordy Bergamo, MD  Chart reviewed as part of pre-operative protocol coverage. Because of Jeralynn B Decelle's past medical history and time since last visit, she will require a follow-up in-office visit in order to better assess preoperative cardiovascular risk.  Pre-op  covering staff: - Please schedule appointment and call patient to inform them. If patient already had an upcoming appointment within acceptable timeframe, please add pre-op  clearance to the appointment notes so provider is aware. - Please contact requesting surgeon's office via preferred method (i.e, phone, fax) to inform them of need for appointment prior to surgery.  This message will also be routed to pharmacy pool for eliquis  hold.   Hasn't been seen since ICD revision, last seen by gen cards 04/2023.  Surgery is 05/07/24  Jon Nat Hails, PA  04/13/2024, 9:22 AM

## 2024-04-13 NOTE — Telephone Encounter (Signed)
 Patient has an appointment scheduled with Dr. Cindie tomorrow 04/14/24 clearance can be addressed at ov asked preop APP help Jon Hails, PA if patient can be cleared or need an office visit with gen cards she said patient is fine to be cleared by EP and note has been made to appointment line that clearance is needed

## 2024-04-13 NOTE — Progress Notes (Unsigned)
  Electrophysiology Office Follow up Visit Note:    Date:  04/14/2024   ID:  KEISHANA KLINGER, DOB [birth year], MRN [id number]  PCP:  Magdaline Debby HERO, MD  [facility] Cardiologist:  Gordy Bergamo, MD  [facility] Electrophysiologist:  OLE ONEIDA HOLTS, MD    Interval History:     VERDINE GRENFELL is a 43 y.o. female who presents for a follow up visit.   Ms Vesely had a lead extraction followed by ICD lead implant on January 05, 2024. Remote interrogations have shown stable device function following her procedure.  She is doing well.  She reports some itchiness at the incision site.      Past medical, surgical, social and family history were reviewed.  ROS:   Please see the history of present illness.    All other systems reviewed and are negative.  EKGs/Labs/Other Studies Reviewed:    The following studies were reviewed today:  April 14, 2024 in-clinic device interrogation personally reviewed Battery and lead parameter stable Reprogrammed lead outputs to maximize battery longevity        Physical Exam:    VS:  BP 132/76 (BP Location: Right Arm, Patient Position: Sitting, Cuff Size: Large)   Pulse 68   Ht 5' 1 (1.549 m)   Wt 266 lb (120.7 kg)   SpO2 98%   BMI 50.26 kg/m     Wt Readings from Last 3 Encounters:  04/14/24 266 lb (120.7 kg)  01/05/24 273 lb 2.4 oz (123.9 kg)  11/14/23 262 lb (118.8 kg)     GEN: no distress.  Obese CARD: RRR, No MRG.  Generator pocket well-healed RESP: No IWOB. CTAB.      ASSESSMENT:    1. Chronic systolic heart failure (HCC)   2. Non-ischemic cardiomyopathy (HCC)   3. LBBB (left bundle branch block)   4. Presence of cardiac resynchronization therapy defibrillator (CRT-D)    PLAN:    In order of problems listed above:  #Chronic systolic heart failure #Left bundle branch block #CRT-D in situ Device functioning appropriately.  Continue remote monitoring. NYHA class II.  Continue GDMT.  Warm and dry on exam  today.  #PE history Continue Eliquis   I discussed my upcoming departure from [facility] during today's clinic appointment.  She will transition her care to one of our partners.   Follow-up 1 year with EP APP    Signed, Ole Holts, MD, [facility], [facility] 04/14/2024 3:27 PM    Electrophysiology [facility]

## 2024-04-14 ENCOUNTER — Ambulatory Visit: Attending: Cardiology | Admitting: Cardiology

## 2024-04-14 ENCOUNTER — Encounter: Payer: Self-pay | Admitting: Cardiology

## 2024-04-14 VITALS — BP 132/76 | HR 68 | Ht 61.0 in | Wt 266.0 lb

## 2024-04-14 DIAGNOSIS — I428 Other cardiomyopathies: Secondary | ICD-10-CM

## 2024-04-14 DIAGNOSIS — Z95810 Presence of automatic (implantable) cardiac defibrillator: Secondary | ICD-10-CM | POA: Diagnosis not present

## 2024-04-14 DIAGNOSIS — I5022 Chronic systolic (congestive) heart failure: Secondary | ICD-10-CM | POA: Diagnosis not present

## 2024-04-14 DIAGNOSIS — I447 Left bundle-branch block, unspecified: Secondary | ICD-10-CM

## 2024-04-14 LAB — CUP PACEART INCLINIC DEVICE CHECK
Battery Remaining Longevity: 57 mo
Brady Statistic RA Percent Paced: 1 %
Brady Statistic RV Percent Paced: 99.98 %
Date Time Interrogation Session: 20251119172857
HighPow Impedance: 64.125
Implantable Lead Connection Status: 753985
Implantable Lead Connection Status: 753985
Implantable Lead Connection Status: 753985
Implantable Lead Implant Date: 20231227
Implantable Lead Implant Date: 20231227
Implantable Lead Implant Date: 20250811
Implantable Lead Location: 753858
Implantable Lead Location: 753859
Implantable Lead Location: 753860
Implantable Lead Model: 3830
Implantable Lead Model: 7122
Implantable Pulse Generator Implant Date: 20231227
Lead Channel Impedance Value: 462.5 Ohm
Lead Channel Impedance Value: 462.5 Ohm
Lead Channel Impedance Value: 475 Ohm
Lead Channel Pacing Threshold Amplitude: 0.5 V
Lead Channel Pacing Threshold Amplitude: 0.5 V
Lead Channel Pacing Threshold Amplitude: 0.75 V
Lead Channel Pacing Threshold Amplitude: 0.875 V
Lead Channel Pacing Threshold Pulse Width: 0.5 ms
Lead Channel Pacing Threshold Pulse Width: 0.5 ms
Lead Channel Pacing Threshold Pulse Width: 0.5 ms
Lead Channel Pacing Threshold Pulse Width: 0.5 ms
Lead Channel Sensing Intrinsic Amplitude: 11.7 mV
Lead Channel Sensing Intrinsic Amplitude: 2.7 mV
Lead Channel Setting Pacing Amplitude: 1.75 V
Lead Channel Setting Pacing Amplitude: 2 V
Lead Channel Setting Pacing Amplitude: 2.5 V
Lead Channel Setting Pacing Pulse Width: 0.5 ms
Lead Channel Setting Pacing Pulse Width: 0.5 ms
Lead Channel Setting Sensing Sensitivity: 0.5 mV
Pulse Gen Serial Number: 5556392
Zone Setting Status: 755011

## 2024-04-14 NOTE — Patient Instructions (Signed)
 Medication Instructions:  Your physician recommends that you continue on your current medications as directed. Please refer to the Current Medication list given to you today.  *If you need a refill on your cardiac medications before your next appointment, please call your pharmacy*  Follow-Up: At [facility], you and your health needs are our priority.  As part of our continuing mission to provide you with exceptional heart care, our providers are all part of one team.  This team includes your primary Cardiologist (physician) and Advanced Practice Providers or APPs (Physician Assistants and Nurse Practitioners) who all work together to provide you with the care you need, when you need it.  Your next appointment:   1 year  Provider:   Donnice Primus, MD, Daphne Barrack, NP, Ozell Jodie Passey, PA-C, Charlies Arthur, PA-C, or Artist Pouch, PA-C

## 2024-04-15 ENCOUNTER — Encounter: Payer: Self-pay | Admitting: Cardiology

## 2024-04-15 ENCOUNTER — Ambulatory Visit: Payer: Self-pay | Admitting: Cardiology

## 2024-04-15 NOTE — Progress Notes (Signed)
 PERIOPERATIVE PRESCRIPTION FOR IMPLANTED CARDIAC DEVICE PROGRAMMING  Patient Information: Name:  Shelly Shields  DOB:  Apr 18, 1981  MRN:  [id number]   Request for Surgical Clearance    Procedure:  LEFT SHOULDER ARTHROSCOPIC BICEPS TENOTOMY    Date of Surgery:  Clearance 05/07/24                               Surgeon:  DR. REYES BOER Surgeon's Group or Practice Name:  [facility] Phone number:  [phone] Fax number:  [phone]   Type of Clearance Requested:   - Medical  - Pharmacy:  Hold Apixaban  (Eliquis )    Type of Anesthesia:  General   Device Information:  Clinic EP Physician:  Ole Holts MD   Device Type:  Defibrillator Manufacturer and Phone #:  St. Jude/Abbott: [phone] Pacemaker Dependent?:  No. Date of Last Device Check:  04/14/24 - in office Normal Device Function?:  Yes.    Electrophysiologist's Recommendations:  Have magnet available. Provide continuous ECG monitoring when magnet is used or reprogramming is to be performed.  Procedure will likely interfere with device function.  Device should be programmed:  Tachy therapies disabled  Per Device Clinic Standing Orders, Shelly JAYSON Fees, RN  11:18 AM 04/15/2024

## 2024-04-19 LAB — GENECONNECT MOLECULAR SCREEN: Genetic Analysis Overall Interpretation: NEGATIVE

## 2024-04-19 NOTE — Telephone Encounter (Addendum)
 Patient with diagnosis of stroke, renal infarct and PE  on Eliquis  for anticoagulation.    Procedure: LEFT SHOULDER ARTHROSCOPIC BICEPS TENOTOMY  Date of procedure: 05/07/24  History of Covid pneumonia in January 2021 during which time she had pulmonary embolism. 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 Patient's hypercoagulabilty workup was negative.   CrCl 75 ml/min Platelet count 221  Form states general anesthesia  Per office protocol, patient can hold Eliquis  for 2 days prior to procedure.   Please resume as soon as safely possible post procedure.  **This guidance is not considered finalized until pre-operative APP has relayed final recommendations.**

## 2024-04-20 NOTE — Telephone Encounter (Signed)
   Patient Name: Shelly Shields  DOB: [birth year] MRN: [id number]  Primary Cardiologist: Gordy Bergamo, MD  Chart reviewed as part of pre-operative protocol coverage.   Patient was recently seen in clinic on 04/14/2024 by Dr. Ole Holts.  Per Dr. Holts, okay to proceed with planned surgery at acceptable risk.  Given past medical history and time since last visit, based on ACC/AHA guidelines, Shelly Shields is at acceptable risk for the planned procedure without further cardiovascular testing.   Per office protocol, patient can hold Eliquis  for 2 days prior to procedure.   Please resume as soon as safely possible post procedure.  I will route this recommendation to the requesting party via Epic fax function and remove from pre-op  pool.  Please call with questions.  Lemarcus Baggerly D Teigen Bellin, NP 04/20/2024, 9:11 AM

## 2024-05-10 ENCOUNTER — Encounter: Payer: Self-pay | Admitting: Cardiology

## 2024-05-13 ENCOUNTER — Other Ambulatory Visit: Payer: Self-pay | Admitting: Cardiology

## 2024-05-13 DIAGNOSIS — I420 Dilated cardiomyopathy: Secondary | ICD-10-CM

## 2024-05-13 DIAGNOSIS — I5022 Chronic systolic (congestive) heart failure: Secondary | ICD-10-CM

## 2024-05-21 ENCOUNTER — Ambulatory Visit

## 2024-05-21 DIAGNOSIS — I428 Other cardiomyopathies: Secondary | ICD-10-CM

## 2024-05-22 ENCOUNTER — Encounter: Payer: Self-pay | Admitting: Cardiology

## 2024-05-22 LAB — CUP PACEART REMOTE DEVICE CHECK
Battery Remaining Longevity: 58 mo
Battery Remaining Percentage: 69 %
Battery Voltage: 2.98 V
Brady Statistic AP VP Percent: 2.3 %
Brady Statistic AP VS Percent: 1 %
Brady Statistic AS VP Percent: 98 %
Brady Statistic AS VS Percent: 1 %
Brady Statistic RA Percent Paced: 2.3 %
Date Time Interrogation Session: 20251226020016
HighPow Impedance: 74 Ohm
HighPow Impedance: 74 Ohm
Implantable Lead Connection Status: 753985
Implantable Lead Connection Status: 753985
Implantable Lead Connection Status: 753985
Implantable Lead Implant Date: 20231227
Implantable Lead Implant Date: 20231227
Implantable Lead Implant Date: 20250811
Implantable Lead Location: 753858
Implantable Lead Location: 753859
Implantable Lead Location: 753860
Implantable Lead Model: 3830
Implantable Lead Model: 7122
Implantable Pulse Generator Implant Date: 20231227
Lead Channel Impedance Value: 480 Ohm
Lead Channel Impedance Value: 480 Ohm
Lead Channel Impedance Value: 540 Ohm
Lead Channel Pacing Threshold Amplitude: 0.5 V
Lead Channel Pacing Threshold Amplitude: 0.625 V
Lead Channel Pacing Threshold Amplitude: 0.875 V
Lead Channel Pacing Threshold Pulse Width: 0.5 ms
Lead Channel Pacing Threshold Pulse Width: 0.5 ms
Lead Channel Pacing Threshold Pulse Width: 0.5 ms
Lead Channel Sensing Intrinsic Amplitude: 11.7 mV
Lead Channel Sensing Intrinsic Amplitude: 2.5 mV
Lead Channel Setting Pacing Amplitude: 1.625
Lead Channel Setting Pacing Amplitude: 2 V
Lead Channel Setting Pacing Amplitude: 2.5 V
Lead Channel Setting Pacing Pulse Width: 0.5 ms
Lead Channel Setting Pacing Pulse Width: 0.5 ms
Lead Channel Setting Sensing Sensitivity: 0.5 mV
Pulse Gen Serial Number: 5556392
Zone Setting Status: 755011

## 2024-05-24 ENCOUNTER — Ambulatory Visit: Payer: Self-pay | Admitting: Cardiology

## 2024-05-24 NOTE — Progress Notes (Signed)
 Remote ICD Transmission

## 2024-06-01 ENCOUNTER — Encounter: Payer: Self-pay | Admitting: Family Medicine

## 2024-06-01 ENCOUNTER — Ambulatory Visit: Admitting: Family Medicine

## 2024-06-01 VITALS — BP 138/86 | Ht 61.0 in | Wt 260.0 lb

## 2024-06-01 DIAGNOSIS — S43432A Superior glenoid labrum lesion of left shoulder, initial encounter: Secondary | ICD-10-CM

## 2024-06-01 DIAGNOSIS — I2699 Other pulmonary embolism without acute cor pulmonale: Secondary | ICD-10-CM | POA: Diagnosis not present

## 2024-06-01 DIAGNOSIS — E119 Type 2 diabetes mellitus without complications: Secondary | ICD-10-CM

## 2024-06-01 DIAGNOSIS — Z95810 Presence of automatic (implantable) cardiac defibrillator: Secondary | ICD-10-CM

## 2024-06-01 DIAGNOSIS — M7502 Adhesive capsulitis of left shoulder: Secondary | ICD-10-CM | POA: Diagnosis not present

## 2024-06-01 NOTE — Patient Instructions (Signed)
 Dr. Dozier Gaba [address]. [place] [phone]  They will call you to schedule an appt. If you don't hear from them by the end of the week please let us  know.

## 2024-06-01 NOTE — Progress Notes (Signed)
 DATE OF VISIT: 06/01/2024        Shelly Shields DOB: 1980-06-18 MRN: [id number]  Discussed the use of AI scribe software for clinical note transcription with the patient, who gave verbal consent to proceed.  History of Present Illness Shelly Shields is a 43 year old female with left shoulder labral tear and adhesive capsulitis who presents with persistent left shoulder pain and dysfunction following a fall. Past medical history significant for PE on chronic anticoagulation with Eliquis , implantable pacemaker/defibrillator, history of left bundle branch block, well-controlled diabetes melitis  Left shoulder pain and dysfunction - Sustained a fall in July 2024 during which the left shoulder 'popped' as her mother assisted her to stand - Immediate onset of mild pain after injury, with no prior left shoulder symptoms - Chronic left shoulder pain, persistent tightness, and restricted range of motion since the injury - Difficulty reaching behind her back - No neck pain - History of migraines  Imaging and diagnostic evaluation - X-ray and CT scan performed due to presence of pacemaker - Imaging revealed a labral tear  Therapeutic interventions and response - Completed physical therapy over the past year with persistent symptoms - Received multiple corticosteroid injections in the left shoulder, all without imaging guidance - Most recent injection in September 2025 provided only minimal relief - Takes Tylenol  daily with modest benefit - Resumed physical therapy prior to planned surgical intervention, but symptoms remain refractory  Surgical planning and barriers - Seen by outside Orthopedist at [facility].  Was scheduled for biceps tenotomy and capsular release in December 2025 - Procedure canceled due to anesthesia concerns related to pacemaker and hospital not being equipped to manage potential cardiac issues - Surgeon referred to us  for further evaluation due to ongoing pain and  functional limitation    Medications:  Outpatient Encounter Medications as of 06/01/2024  Medication Sig   acetaminophen  (TYLENOL ) 325 MG tablet Take 1-2 tablets (325-650 mg total) by mouth every 4 (four) hours as needed for mild pain (pain score 1-3).   Albuterol -Budesonide  (AIRSUPRA) 90-80 MCG/ACT AERO Inhale 2 puffs into the lungs every 6 (six) hours as needed (shortness of breath or wheezing).   apixaban  (ELIQUIS ) 5 MG TABS tablet Take 1 tablet (5 mg total) by mouth 2 (two) times daily. Resume on 11/2 morning   Baclofen 5 MG TABS Take 5-10 mg by mouth daily as needed (Migraines).   Blood Glucose Monitoring Suppl (ACCU-CHEK NANO SMARTVIEW) W/DEVICE KIT 1 Device by Does not apply route 4 (four) times daily - after meals and at bedtime.   butalbital -acetaminophen -caffeine  (FIORICET ) 50-325-40 MG tablet Take 1 tablet by mouth every 6 (six) hours as needed for headache.   carvedilol  (COREG ) 25 MG tablet TAKE 2 TABLETS(50 MG) BY MOUTH TWICE DAILY WITH A MEAL   Continuous Blood Gluc Transmit (DEXCOM G6 TRANSMITTER) MISC See admin instructions.   Continuous Glucose Sensor (DEXCOM G7 SENSOR) MISC by Does not apply route.   Cyanocobalamin (B-12) 1000 MCG SUBL Place 1,000 mcg under the tongue daily.   escitalopram  (LEXAPRO ) 10 MG tablet Take 10 mg by mouth daily.   fluticasone (FLONASE) 50 MCG/ACT nasal spray Place 2 sprays into both nostrils at bedtime.   gabapentin  (NEURONTIN ) 300 MG capsule Take 600 mg by mouth 3 (three) times daily as needed (pain).   glucose blood (ACCU-CHEK SMARTVIEW) test strip Check sugar 6 x daily   glycopyrrolate (ROBINUL) 1 MG tablet Take 1 mg by mouth 3 (three) times daily as needed (stomach acid).  HUMALOG KWIKPEN 200 UNIT/ML KwikPen Inject 10-50 Units into the skin See admin instructions. Via Insulin  Pump   hydrOXYzine  (ATARAX ) 10 MG tablet Take 1 tablet (10 mg total) by mouth 3 (three) times daily as needed. (Patient taking differently: Take 10 mg by mouth 3 (three)  times daily as needed for itching or anxiety.)   Insulin  Disposable Pump (OMNIPOD DASH PODS, GEN 4,) MISC 200 Units every other day. Humalog   Insulin  Pen Needle (INSUPEN PEN NEEDLES) 32G X 4 MM MISC BD Pen Needles- brand specific. Inject insulin  via insulin  pen daily   ivabradine  (CORLANOR ) 7.5 MG TABS tablet Take 1 tablet (7.5 mg total) by mouth 2 (two) times daily with a meal.   ketotifen  (ZADITOR ) 0.035 % ophthalmic solution Place 1 drop into both eyes daily as needed (allergies).   Lancet Devices (ACCU-CHEK SOFTCLIX) lancets Use as instructed for blood glucose checks four times daily, before meals and at bedtime   levocetirizine (XYZAL ) 5 MG tablet Take 5 mg by mouth every evening.   medroxyPROGESTERone (DEPO-PROVERA) 150 MG/ML injection Inject 150 mg into the muscle every 3 (three) months.   montelukast (SINGULAIR) 10 MG tablet Take 10 mg by mouth at bedtime.   MOUNJARO 7.5 MG/0.5ML Pen Inject 7.5 mg into the skin once a week.   Multiple Vitamin (MULTI-VITAMIN) tablet Take 1 tablet by mouth daily.   nitrofurantoin , macrocrystal-monohydrate, (MACROBID ) 100 MG capsule Take 1 capsule (100 mg total) by mouth 2 (two) times daily.   nortriptyline  (PAMELOR ) 10 MG capsule Take 30 mg by mouth at bedtime.   NURTEC 75 MG TBDP Take 75 mg by mouth daily as needed (migraines).   nystatin  cream (MYCOSTATIN ) Apply to affected area 2 times daily (Patient not taking: Reported on 04/14/2024)   ondansetron  (ZOFRAN ) 4 MG tablet Take 4 mg by mouth every 8 (eight) hours as needed for nausea or vomiting.   ondansetron  (ZOFRAN -ODT) 4 MG disintegrating tablet Take 1 tablet (4 mg total) by mouth every 8 (eight) hours as needed for nausea or vomiting. (Patient not taking: Reported on 04/14/2024)   pantoprazole  (PROTONIX ) 40 MG tablet Take 40 mg by mouth daily.   potassium chloride  (KLOR-CON ) 10 MEQ tablet Take 1 tablet (10 mEq total) by mouth daily. (Patient taking differently: Take 10 mEq by mouth 2 (two) times daily.)    rosuvastatin  (CRESTOR ) 20 MG tablet Take 1 tablet (20 mg total) by mouth daily at 6 PM.   sacubitril -valsartan  (ENTRESTO ) 97-103 MG TAKE 1 TABLET BY MOUTH TWICE DAILY   spironolactone  (ALDACTONE ) 25 MG tablet TAKE 1 TABLET(25 MG) BY MOUTH DAILY   topiramate  (TOPAMAX ) 100 MG tablet Take 200 mg by mouth daily.   torsemide  (DEMADEX ) 20 MG tablet TAKE 1 TABLET BY MOUTH AS DIRECTED. ONE TABLET DAILY IN THE MORNING. TAKE ADDITIONAL IN AFTERNOON IF WEIGHT UP GREATER THAN 3 LBS IN 3 DAYS   TRUEPLUS LANCETS 33G MISC USE TO CHECK BLOOD GLUCOSE QID - AFTER MEALS AND AT BEDTIME   No facility-administered encounter medications on file as of 06/01/2024.    Allergies: is allergic to farxiga [dapagliflozin], ibuprofen, ketorolac, lisinopril, and metformin .  Physical Examination: Vitals: BP 138/86   Ht 5' 1 (1.549 m)   Wt 260 lb (117.9 kg)   BMI 49.13 kg/m  GENERAL:  Shelly Shields is a 44 y.o. female appearing their stated age, alert and oriented x 3, in no apparent distress.  SKIN: no rashes or lesions, skin clean, dry, intact MSK: Left shoulder with no acute abnormalities.  Decreased active and passive range of motion in all planes with associated pain.  Tender to palpation along the bicipital groove and greater tuberosity.  No tenderness over the [facility] joint.  Rotator cuff strength 4+/5 throughout.  Positive decant, positive Hawkins, positive Neer.  Negative drop arm. Right shoulder with full range of motion without pain, weakness, instability NEURO: sensation intact to light touch, DTR 2/4 bicep, tricep, brachial radialis bilaterally VASC: pulses 2+ and symmetric radial artery bilaterally, no edema  Radiology: Left shoulder CT arthrogram completed 06/25/2023 at [facility] showing: IMPRESSION:  1. Tear of the superior labrum with questionable tear of the inferior labrum.  2. Mild AC joint and glenohumeral joint degenerative changes.  Left shoulder x-ray 12/25/2022 personally reviewed and  interpreted by me today showing: - No acute bony abnormality - Pacemaker visualized in the left anterior chest - No abnormality  Assessment and Plan Assessment & Plan Acute on chronic left shoulder pain status post fall July 2024 with CT arthrogram showing labral tear, exam today with signs of adhesive capsulitis  Chronic labral tear and adhesive capsulitis with persistent pain and limited motion. Surgical intervention previously planned but canceled due to pacemaker concerns. Examination confirmed adhesive capsulitis and likely symptomatic labral pathology.  - Imaging reviewed in detail as noted above - Treatment options reviewed.  She has failed extensive conservative therapy with physical therapy, multiple injections.  Was scheduled for surgical intervention, but was canceled due to hospital not being able to accommodate possible cardio/pulmonary complications.  Reviewed potential other conservative interventions including ultrasound-guided glenohumeral injection.  She is not interested.  She is motivated for surgical management. - Referred to Dr. Dozier with [facility] for surgical evaluation and management. - Provided printed imaging reports and instructed her to bring all imaging discs to the specialist appointment. - Uploaded imaging reports to her chart for continuity of care. - Provided specialist office contact information and advised her to call to expedite scheduling.  Chronic anticoagulation for history of PE - Should continue Eliquis  as she is doing  Implantable pacemaker/defibrillator - Prior surgical intervention canceled by anesthesia due to concerns of pacemaker/defibrillator.  Sounds as though that hospital may not have been equipped to manage potential cardiopulmonary complications - Will refer to Dr. Dozier as noted above for consideration of further evaluation and treatment, as patient is motivated for surgical intervention  Diabetes mellitus  type 2 with good control - Review of previous labs show most recent A1c 6.8 on 03/11/2024 - Should continue diabetes regimen per PCP and other specialist  Patient expressed understanding & agreement with above.  Encounter Diagnoses  Name Primary?   Labral tear of shoulder, left, initial encounter Yes   Adhesive capsulitis of left shoulder    Cardiac resynchronization therapy defibrillator (CRT-D) in place    Other pulmonary embolism without acute cor pulmonale, unspecified chronicity (HCC)    Diabetes mellitus without complication (HCC)     Orders Placed This Encounter  Procedures   Ambulatory referral to Orthopedic Surgery     Contains text generated by [employer].

## 2024-06-06 ENCOUNTER — Other Ambulatory Visit: Payer: Self-pay | Admitting: Cardiology

## 2024-06-06 DIAGNOSIS — I5022 Chronic systolic (congestive) heart failure: Secondary | ICD-10-CM

## 2024-06-08 ENCOUNTER — Other Ambulatory Visit: Payer: Self-pay | Admitting: Orthopedic Surgery

## 2024-06-09 ENCOUNTER — Telehealth: Payer: Self-pay | Admitting: Cardiology

## 2024-06-09 NOTE — Telephone Encounter (Signed)
 Spoke with pt who reports she was scheduled yesterday for shoulder surgery in March.  Pt states surgeon will be sending clearance request.  Pt advised once we receive request pre-op  team will review and decide how she should move forward.  Will also forward to pre-op  team to make them aware.  Pt verbalizes understanding and agrees with current plan.

## 2024-06-09 NOTE — Telephone Encounter (Signed)
 Spoke to the requesting office and informed the clearance form will be worked on and sent over once completed.

## 2024-06-09 NOTE — Telephone Encounter (Signed)
 Pt states she would be continuing her care with Dr. Inocencio and would like to get his advice about her having shoulder surgery

## 2024-06-10 NOTE — Telephone Encounter (Signed)
" ° °  Name: Shelly Shields  DOB: 1980/10/07  MRN: [id number]  Primary Cardiologist: Gordy Bergamo, MD  Chart reviewed as part of pre-operative protocol coverage. The patient has an upcoming visit scheduled with Dr. Bergamo on 07/09/2024 at which time clearance can be addressed in case there are any issues that would impact surgical recommendations.  Left shoulder arthroplasty is not scheduled until 08/05/2024 as below. I added preop FYI to appointment note so that provider is aware to address at time of outpatient visit.  Per office protocol the cardiology provider should forward their finalized clearance decision and recommendations regarding antiplatelet therapy to the requesting party below.    This message will also be routed to pharmacy pool for input on holding Eliquis  as requested below so that this information is available to the clearing provider at time of patient's appointment.   I will route this message as FYI to requesting party and remove this message from the preop box as separate preop APP input not needed at this time.   Please call with any questions.  Lum LITTIE Louis, NP  06/10/2024, 11:01 AM   "

## 2024-06-10 NOTE — Telephone Encounter (Signed)
"  ° °  Pre-operative Risk Assessment    Patient Name: Shelly Shields  DOB: [birth year] MRN: [id number]   Date of last office visit: 04/14/24 DR. LAMBERT Date of next office visit: 07/09/24 DR. LADONA; 1 YR F/U   Request for Surgical Clearance    Procedure:  LEFT SHOULDER ARTHROSCOPIC DEBRIDEMENT BICEPS TENOTOMY, CAPSULAR RELEASE AND MANIPULATION   Date of Surgery:  Clearance 08/05/24                                Surgeon:  DR. JUSTIN CHANDLER Surgeon's Group or Practice Name:  [facility] Phone number:  [phone] Fax number:  [phone] PAULA JORGENSEN   Type of Clearance Requested:   - Medical  - Pharmacy:  Hold Apixaban  (Eliquis )     Type of Anesthesia:  CHOICE   Additional requests/questions:    Bonney Niels Jest   06/10/2024, 9:00 AM   "

## 2024-06-21 NOTE — Telephone Encounter (Signed)
 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    Procedure:  LEFT SHOULDER ARTHROSCOPIC DEBRIDEMENT BICEPS TENOTOMY, CAPSULAR RELEASE AND MANIPULATION    Date of Surgery:  Clearance 08/05/24    CrCl 75 (with adjusted body weight) Platelet count 221  Per office protocol, patient can hold Eliquis  for 2 days prior to procedure.   Patient will not need bridging with Lovenox  (enoxaparin ) around procedure.  **This guidance is not considered finalized until pre-operative APP has relayed final recommendations.**

## 2024-06-22 ENCOUNTER — Other Ambulatory Visit: Payer: Self-pay | Admitting: Cardiology

## 2024-06-22 ENCOUNTER — Telehealth: Payer: Self-pay | Admitting: Cardiology

## 2024-06-22 DIAGNOSIS — I5022 Chronic systolic (congestive) heart failure: Secondary | ICD-10-CM

## 2024-06-22 DIAGNOSIS — I420 Dilated cardiomyopathy: Secondary | ICD-10-CM

## 2024-06-22 MED ORDER — TORSEMIDE 20 MG PO TABS: ORAL_TABLET | ORAL | 0 refills | Status: DC

## 2024-06-22 NOTE — Telephone Encounter (Signed)
" °*  STAT* If patient is at the pharmacy, call can be transferred to refill team.   1. Which medications need to be refilled? (please list name of each medication and dose if known) torsemide  (DEMADEX ) 20 MG tablet   2. Which pharmacy/location (including street and city if local pharmacy) is medication to be sent to?  [facility] [phone] - [place], [place] - [address]      3. Do they need a 30 day or 90 day supply? 90 day  "

## 2024-06-22 NOTE — Telephone Encounter (Signed)
 Pt scheduled 07/09/24, 30 day refill has been sent.

## 2024-06-23 ENCOUNTER — Ambulatory Visit (HOSPITAL_BASED_OUTPATIENT_CLINIC_OR_DEPARTMENT_OTHER): Admit: 2024-06-23 | Discharge: 2024-06-23 | Disposition: A | Admitting: Radiology

## 2024-06-23 ENCOUNTER — Encounter (HOSPITAL_BASED_OUTPATIENT_CLINIC_OR_DEPARTMENT_OTHER): Payer: Self-pay

## 2024-06-23 ENCOUNTER — Ambulatory Visit (HOSPITAL_BASED_OUTPATIENT_CLINIC_OR_DEPARTMENT_OTHER)
Admission: RE | Admit: 2024-06-23 | Discharge: 2024-06-23 | Disposition: A | Discharge status: Home / Self Care | Source: Ambulatory Visit | Attending: Family Medicine | Admitting: Family Medicine

## 2024-06-23 VITALS — BP 141/84 | HR 77 | Temp 99.0°F | Resp 20

## 2024-06-23 DIAGNOSIS — S63502A Unspecified sprain of left wrist, initial encounter: Secondary | ICD-10-CM

## 2024-06-23 DIAGNOSIS — W010XXA Fall on same level from slipping, tripping and stumbling without subsequent striking against object, initial encounter: Secondary | ICD-10-CM | POA: Diagnosis not present

## 2024-06-23 DIAGNOSIS — M25532 Pain in left wrist: Secondary | ICD-10-CM | POA: Diagnosis not present

## 2024-06-23 DIAGNOSIS — S6992XA Unspecified injury of left wrist, hand and finger(s), initial encounter: Secondary | ICD-10-CM

## 2024-06-23 NOTE — ED Triage Notes (Signed)
 Pt states she slipped on ice yesterday and tried to catch herself with her left hand. Pain is located across the back of her hand with slight swelling. She has taken tylenol  with no relief.

## 2024-06-23 NOTE — ED Provider Notes (Signed)
 " [facility]    CSN: [id number] Arrival date & time: 06/23/24  1147      History   Chief Complaint Chief Complaint  Patient presents with   Hand Problem    Clemens and hurt my left hand and the palm is stil hurting when use it no swelling - Entered by patient    HPI DAZIAH HESLER is a 44 y.o. female.   44 year old female who slipped and fell on the ice on 06/22/2024.  She tried to catch herself and hit her left wrist as she was falling/landing.  She has pain on the dorsum of her hand at the wrist.  There is some minimal swelling.  She has taken Tylenol  with little or no relief.  She is not really interested in NSAIDs or narcotics.  She just really wants to be sure is not broken and to get a wrist splint if needed.     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 05/22/2022 05/22/2022   Migraines    Obesity    Pulmonary embolism (HCC) 05/2019   with COVID   Sleep apnea     Patient Active Problem List   Diagnosis Date Noted   Cardiac resynchronization therapy defibrillator (CRT-D) in place 01/05/2024   Encounter for assessment of implantable cardioverter-defibrillator (ICD) 05/22/2022   ICD  A[employer] Unify Asura Biventricular implantable cardioverter-defibrillator  (CRT-D) in situ 05/22/2022 05/22/2022   Pacemaker 05/22/2022   NICM (nonischemic cardiomyopathy) (HCC) 04/23/2022   LBBB (left bundle branch block) 04/23/2022   Chronic systolic heart failure (HCC) 03/26/2021   Acute CVA (cerebrovascular accident) (HCC) 03/27/2020   Sleep apnea    Diabetes mellitus without complication (HCC)    COVID-19 long hauler    Obesity    Acute pulmonary  embolism (HCC) 06/30/2019   Pulmonary embolism (HCC) 05/2019   Hypernatremia    Asthma, chronic    DKA (diabetic ketoacidosis) (HCC) 11/27/2014   Diabetes mellitus, new onset (HCC)    Hyperglycemic hyperosmolar nonketotic coma (HCC)    Essential hypertension     Past Surgical History:  Procedure Laterality Date   BILATERAL CARPAL TUNNEL RELEASE     BIV ICD INSERTION CRT-D N/A 05/22/2022   Procedure: BIV ICD INSERTION CRT-D;  Surgeon: Fernande Elspeth BROCKS, MD;  Location: [name] INVASIVE CV LAB;  Service: Cardiovascular;  Laterality: N/A;   LEAD EXTRACTION N/A 01/05/2024   Procedure: LEAD EXTRACTION;  Surgeon: Cindie Ole DASEN, MD;  Location: [facility] INVASIVE CV LAB;  Service: Cardiovascular;  Laterality: N/A;   LEAD INSERTION N/A 01/05/2024   Procedure: LEAD INSERTION;  Surgeon: Cindie Ole DASEN, MD;  Location: [facility] INVASIVE CV LAB;  Service: Cardiovascular;  Laterality: N/A;   LEAD REVISION/REPAIR N/A 05/22/2022   Procedure: LEAD REVISION/REPAIR;  Surgeon: Fernande Elspeth BROCKS, MD;  Location: [facility] INVASIVE CV LAB;  Service: Cardiovascular;  Laterality: N/A;   LEFT HEART CATH AND CORONARY ANGIOGRAPHY N/A 03/27/2021   Procedure: LEFT HEART CATH AND CORONARY ANGIOGRAPHY;  Surgeon: Elmira Newman PARAS, MD;  Location: [facility] INVASIVE CV LAB;  Service: Cardiovascular;  Laterality: N/A;   TEE WITHOUT CARDIOVERSION N/A 03/28/2020   Procedure: TRANSESOPHAGEAL ECHOCARDIOGRAM (TEE);  Surgeon: Ladona Heinz, MD;  Location: [facility] ENDOSCOPY;  Service: Cardiovascular;  Laterality: N/A;   triger finger release      OB History   No obstetric history on file.      Home Medications    Prior to Admission medications  Medication Sig Start Date End Date Taking? Authorizing Provider  acetaminophen  (TYLENOL ) 325 MG tablet Take 1-2 tablets (325-650 mg total) by mouth every 4 (four) hours as needed for mild pain (pain score 1-3). 01/06/24   Aniceto Daphne CROME, NP  Albuterol -Budesonide  (AIRSUPRA) 90-80 MCG/ACT AERO Inhale 2 puffs into  the lungs every 6 (six) hours as needed (shortness of breath or wheezing).    [provider]  apixaban  (ELIQUIS ) 5 MG TABS tablet Take 1 tablet (5 mg total) by mouth 2 (two) times daily. Resume on 11/2 morning 03/27/21   Patwardhan, Newman PARAS, MD  Baclofen 5 MG TABS Take 5-10 mg by mouth daily as needed (Migraines). 08/09/19   [provider]  Blood Glucose Monitoring Suppl (ACCU-CHEK NANO SMARTVIEW) W/DEVICE KIT 1 Device by Does not apply route 4 (four) times daily - after meals and at bedtime. 11/29/14   Adamo, Elena M, MD  butalbital -acetaminophen -caffeine  (FIORICET ) 50-325-40 MG tablet Take 1 tablet by mouth every 6 (six) hours as needed for headache. 12/11/21   [provider]  carvedilol  (COREG ) 25 MG tablet TAKE 2 TABLETS(50 MG) BY MOUTH TWICE DAILY WITH A MEAL 05/13/24   Ladona Heinz, MD  Continuous Blood Gluc Transmit (DEXCOM G6 TRANSMITTER) MISC See admin instructions. 08/16/20   [provider]  Continuous Glucose Sensor (DEXCOM G7 SENSOR) MISC by Does not apply route.    [provider]  Cyanocobalamin (B-12) 1000 MCG SUBL Place 1,000 mcg under the tongue daily.    [provider]  escitalopram  (LEXAPRO ) 10 MG tablet Take 10 mg by mouth daily. 03/01/20   [provider]  fluticasone (FLONASE) 50 MCG/ACT nasal spray Place 2 sprays into both nostrils at bedtime. 09/22/14   [provider]  gabapentin  (NEURONTIN ) 300 MG capsule Take 600 mg by mouth 3 (three) times daily as needed (pain). 02/22/22   [provider]  glucose blood (ACCU-CHEK SMARTVIEW) test strip Check sugar 6 x daily 11/29/14   Adamo, Elena M, MD  glycopyrrolate (ROBINUL) 1 MG tablet Take 1 mg by mouth 3 (three) times daily as needed (stomach acid). 09/12/22   [provider]  HUMALOG KWIKPEN 200 UNIT/ML KwikPen Inject 10-50 Units into the skin See admin instructions. Via Insulin  Pump 09/06/21   [provider]  hydrOXYzine  (ATARAX ) 10 MG  tablet Take 1 tablet (10 mg total) by mouth 3 (three) times daily as needed. Patient taking differently: Take 10 mg by mouth 3 (three) times daily as needed for itching or anxiety. 10/24/22   Ladona Heinz, MD  Insulin  Disposable Pump (OMNIPOD DASH PODS, GEN 4,) MISC 200 Units every other day. Humalog 02/23/21   [provider]  Insulin  Pen Needle (INSUPEN PEN NEEDLES) 32G X 4 MM MISC BD Pen Needles- brand specific. Inject insulin  via insulin  pen daily 11/29/14   Adamo, Elena M, MD  ivabradine  (CORLANOR ) 7.5 MG TABS tablet TAKE 1 TABLET(7.5 MG) BY MOUTH TWICE DAILY WITH A MEAL 06/22/24   Ladona Heinz, MD  ketotifen  (ZADITOR ) 0.035 % ophthalmic solution Place 1 drop into both eyes daily as needed (allergies).    [provider]  Lancet Devices [facility]) lancets Use as instructed for blood glucose checks four times daily, before meals and at bedtime 11/29/14   Adamo, Elena  M, MD  levocetirizine (XYZAL ) 5 MG tablet Take 5 mg by mouth every evening.    [provider]  medroxyPROGESTERone (DEPO-PROVERA) 150 MG/ML injection Inject 150 mg into the muscle every 3 (three) months.    [provider]  montelukast (SINGULAIR) 10 MG tablet Take 10 mg by mouth at bedtime.    [provider]  MOUNJARO 7.5 MG/0.5ML Pen Inject 7.5 mg into the skin once a week.    [provider]  Multiple Vitamin (MULTI-VITAMIN) tablet Take 1 tablet by mouth daily.    [provider]  nitrofurantoin , macrocrystal-monohydrate, (MACROBID ) 100 MG capsule Take 1 capsule (100 mg total) by mouth 2 (two) times daily. 04/11/24   Ward, Harlene PEDLAR, PA-C  nortriptyline  (PAMELOR ) 10 MG capsule Take 30 mg by mouth at bedtime.    [provider]  NURTEC 75 MG TBDP Take 75 mg by mouth daily as needed (migraines). 04/02/21   [provider]  nystatin  cream (MYCOSTATIN ) Apply to affected area 2 times daily Patient not taking: Reported on 04/14/2024 04/20/23   Rising,  Asberry, PA-C  ondansetron  (ZOFRAN ) 4 MG tablet Take 4 mg by mouth every 8 (eight) hours as needed for nausea or vomiting. 10/29/23   [provider]  ondansetron  (ZOFRAN -ODT) 4 MG disintegrating tablet Take 1 tablet (4 mg total) by mouth every 8 (eight) hours as needed for nausea or vomiting. Patient not taking: Reported on 04/14/2024 09/09/21   Hildegard Loge, PA-C  pantoprazole  (PROTONIX ) 40 MG tablet Take 40 mg by mouth daily.    [provider]  potassium chloride  (KLOR-CON ) 10 MEQ tablet Take 1 tablet (10 mEq total) by mouth daily. Patient taking differently: Take 10 mEq by mouth 2 (two) times daily. 08/19/23 04/14/24  Ladona Heinz, MD  rosuvastatin  (CRESTOR ) 20 MG tablet Take 1 tablet (20 mg total) by mouth daily at 6 PM. 03/30/20   Elgergawy, Brayton RAMAN, MD  sacubitril -valsartan  (ENTRESTO ) 97-103 MG TAKE 1 TABLET BY MOUTH TWICE DAILY 07/03/23   Ladona Heinz, MD  spironolactone  (ALDACTONE ) 25 MG tablet TAKE 1 TABLET(25 MG) BY MOUTH DAILY 10/30/23   Ladona Heinz, MD  topiramate  (TOPAMAX ) 100 MG tablet Take 200 mg by mouth daily.    [provider]  torsemide  (DEMADEX ) 20 MG tablet TAKE 1 TABLET BY MOUTH IN THE MORNING. MAY TAKE AN ADDITIONAL 1/2 TABLET BY MOUTH IN THE AFTERNOON IF WEIGHT UP GREATER THEN 3 LB IN 3 DAYS 06/22/24   Ladona Heinz, MD  TRUEPLUS LANCETS 33G MISC USE TO CHECK BLOOD GLUCOSE QID - AFTER MEALS AND AT BEDTIME 12/22/14   [provider]    Family History Family History  Problem Relation Age of Onset   Hypertension Mother    Thyroid disease Mother    Hypertension Father    Stroke Father 78   Diabetes Father    Sleep apnea Father    Colon cancer Maternal Uncle 60   Aneurysm Paternal Aunt 65   CVA Paternal Uncle 56   Cancer - Lung Maternal Grandmother    Heart failure Paternal Grandfather     Social History Social History[1]   Allergies   Farxiga [dapagliflozin], Ibuprofen, Ketorolac, Lisinopril, and Metformin    Review of Systems Review of  Systems  Constitutional:  Negative for chills and fever.  HENT:  Negative for ear pain and sore throat.   Eyes:  Negative for pain and visual disturbance.  Respiratory:  Negative for cough and shortness of breath.   Cardiovascular:  Negative for chest  pain and palpitations.  Gastrointestinal:  Negative for abdominal pain, constipation, diarrhea, nausea and vomiting.  Genitourinary:  Negative for dysuria and hematuria.  Musculoskeletal:  Positive for joint swelling (Pain and swelling of left wrist after a fall). Negative for arthralgias and back pain.  Skin:  Negative for color change and rash.  Neurological:  Negative for seizures and syncope.  All other systems reviewed and are negative.    Physical Exam Triage Vital Signs ED Triage Vitals  Encounter Vitals Group     BP 06/23/24 1238 (!) 141/84     Girls Systolic BP Percentile --      Girls Diastolic BP Percentile --      Boys Systolic BP Percentile --      Boys Diastolic BP Percentile --      Pulse Rate 06/23/24 1238 77     Resp 06/23/24 1238 20     Temp 06/23/24 1238 99 F (37.2 C)     Temp Source 06/23/24 1238 Oral     SpO2 06/23/24 1238 98 %     Weight --      Height --      Head Circumference --      Peak Flow --      Pain Score 06/23/24 1236 5     Pain Loc --      Pain Education --      Exclude from Growth Chart --    No data found.  Updated Vital Signs BP (!) 141/84 (BP Location: Right Arm)   Pulse 77   Temp 99 F (37.2 C) (Oral)   Resp 20   SpO2 98%   Visual Acuity Right Eye Distance:   Left Eye Distance:   Bilateral Distance:    Right Eye Near:   Left Eye Near:    Bilateral Near:     Physical Exam Vitals and nursing note reviewed.  Constitutional:      General: She is not in acute distress.    Appearance: She is well-developed. She is not ill-appearing or toxic-appearing.  HENT:     Head: Normocephalic and atraumatic.     Right Ear: Hearing, tympanic membrane, ear canal and external ear  normal.     Left Ear: Hearing, tympanic membrane, ear canal and external ear normal.     Nose: No congestion or rhinorrhea.     Right Sinus: No maxillary sinus tenderness or frontal sinus tenderness.     Left Sinus: No maxillary sinus tenderness or frontal sinus tenderness.     Mouth/Throat:     Lips: Pink.     Mouth: Mucous membranes are moist.     Pharynx: Uvula midline. No oropharyngeal exudate or posterior oropharyngeal erythema.     Tonsils: No tonsillar exudate.  Eyes:     Conjunctiva/sclera: Conjunctivae normal.     Pupils: Pupils are equal, round, and reactive to light.  Cardiovascular:     Rate and Rhythm: Normal rate and regular rhythm.     Heart sounds: S1 normal and S2 normal. No murmur heard. Pulmonary:     Effort: Pulmonary effort is normal. No respiratory distress.     Breath sounds: Normal breath sounds. No decreased breath sounds, wheezing, rhonchi or rales.  Musculoskeletal:        General: No swelling.     Right shoulder: Normal.     Left shoulder: Normal.     Right upper arm: Normal.     Left upper arm: Normal.     Right elbow: Normal.  Left elbow: Normal.     Right forearm: Normal.     Left forearm: Normal.     Right wrist: Normal.     Left wrist: Swelling, tenderness and bony tenderness present. No deformity, effusion, lacerations, snuff box tenderness or crepitus. Decreased range of motion (Minimal decrease in range of motion due to pain). Normal pulse.     Right hand: Normal.     Left hand: Normal.     Cervical back: Neck supple.  Lymphadenopathy:     Head:     Right side of head: No submental, submandibular, tonsillar, preauricular or posterior auricular adenopathy.     Left side of head: No submental, submandibular, tonsillar, preauricular or posterior auricular adenopathy.     Cervical: No cervical adenopathy.     Right cervical: No superficial cervical adenopathy.    Left cervical: No superficial cervical adenopathy.  Skin:    General: Skin is  warm and dry.     Capillary Refill: Capillary refill takes less than 2 seconds.     Findings: No rash.  Neurological:     Mental Status: She is alert and oriented to person, place, and time.  Psychiatric:        Mood and Affect: Mood normal.      UC Treatments / Results  Labs (all labs ordered are listed, but only abnormal results are displayed) Labs Reviewed - No data to display  EKG   Radiology No results found.  Procedures Procedures (including critical care time)  Medications Ordered in UC Medications - No data to display  Initial Impression / Assessment and Plan / UC Course  I have reviewed the triage vital signs and the nursing notes.  Pertinent labs & imaging results that were available during my care of the patient were reviewed by me and considered in my medical decision making (see chart for details).  Plan of Care (see discharge instructions for additional patient precautions and education): Left wrist pain and sprain after a fall: X-rays appear negative.  Will update the patient once radiology reviews the films.  Wrist splint applied.  Encouraged ice and elevation.  Offered diclofenac for pain but patient declined.  She will use acetaminophen .  Declined work excuse.  Follow-up as needed.  I reviewed the plan of care with the patient and/or the patient's guardian.  The patient and/or guardian had time to ask questions and acknowledged that the questions were answered.  Final Clinical Impressions(s) / UC Diagnoses   Final diagnoses:  Left wrist sprain, initial encounter  Left wrist pain  Fall on same level from slipping, tripping or stumbling, initial encounter     Discharge Instructions      Left wrist pain and sprain after a fall: X-rays appear negative.  Will update the patient once radiology reviews the films.  Wrist splint applied.  Encouraged ice and elevation.  Offered diclofenac for pain but patient declined.  She will use  acetaminophen .  Declined work excuse.  Follow-up as needed.     ED Prescriptions   None    PDMP not reviewed this encounter.    [1]  Social History Tobacco Use   Smoking status: Never   Smokeless tobacco: Never  Vaping Use   Vaping status: Never Used  Substance Use Topics   Alcohol use: Yes    Alcohol/week: 0.0 standard drinks of alcohol    Comment: occasional    Drug use: No     Ival Domino, FNP 06/23/24 1329  "

## 2024-06-23 NOTE — Discharge Instructions (Addendum)
 Left wrist pain and sprain after a fall: X-rays appear negative.  Will update the patient once radiology reviews the films.  Wrist splint applied.  Encouraged ice and elevation.  Offered diclofenac for pain but patient declined.  She will use acetaminophen .  Declined work excuse.  Follow-up as needed.

## 2024-06-26 ENCOUNTER — Ambulatory Visit (HOSPITAL_COMMUNITY): Payer: Self-pay

## 2024-07-09 ENCOUNTER — Ambulatory Visit: Admitting: Cardiology

## 2024-08-05 ENCOUNTER — Ambulatory Visit (HOSPITAL_COMMUNITY): Admit: 2024-08-05 | Admitting: Orthopedic Surgery

## 2024-08-05 SURGERY — ARTHROSCOPY, SHOULDER WITH DEBRIDEMENT
Anesthesia: Choice | Laterality: Left

## 2024-08-18 ENCOUNTER — Encounter

## 2024-08-20 ENCOUNTER — Ambulatory Visit
